# Patient Record
Sex: Male | Born: 1935
Health system: Southern US, Community
[De-identification: ages and names within clinical notes are randomized; demographics above are authoritative.]

## PROBLEM LIST (undated history)

## (undated) DIAGNOSIS — F419 Anxiety disorder, unspecified: Secondary | ICD-10-CM

## (undated) DIAGNOSIS — I509 Heart failure, unspecified: Secondary | ICD-10-CM

## (undated) DIAGNOSIS — E119 Type 2 diabetes mellitus without complications: Secondary | ICD-10-CM

## (undated) DIAGNOSIS — N189 Chronic kidney disease, unspecified: Secondary | ICD-10-CM

## (undated) DIAGNOSIS — J189 Pneumonia, unspecified organism: Secondary | ICD-10-CM

## (undated) DIAGNOSIS — E291 Testicular hypofunction: Secondary | ICD-10-CM

## (undated) DIAGNOSIS — R972 Elevated prostate specific antigen [PSA]: Secondary | ICD-10-CM

## (undated) DIAGNOSIS — E669 Obesity, unspecified: Secondary | ICD-10-CM

## (undated) DIAGNOSIS — M75100 Unspecified rotator cuff tear or rupture of unspecified shoulder, not specified as traumatic: Secondary | ICD-10-CM

## (undated) DIAGNOSIS — N529 Male erectile dysfunction, unspecified: Secondary | ICD-10-CM

## (undated) DIAGNOSIS — I10 Essential (primary) hypertension: Secondary | ICD-10-CM

## (undated) DIAGNOSIS — E785 Hyperlipidemia, unspecified: Secondary | ICD-10-CM

## (undated) DIAGNOSIS — J45909 Unspecified asthma, uncomplicated: Secondary | ICD-10-CM

## (undated) DIAGNOSIS — R011 Cardiac murmur, unspecified: Secondary | ICD-10-CM

## (undated) DIAGNOSIS — D649 Anemia, unspecified: Secondary | ICD-10-CM

## (undated) DIAGNOSIS — G629 Polyneuropathy, unspecified: Secondary | ICD-10-CM

## (undated) DIAGNOSIS — I2510 Atherosclerotic heart disease of native coronary artery without angina pectoris: Secondary | ICD-10-CM

## (undated) DIAGNOSIS — I739 Peripheral vascular disease, unspecified: Secondary | ICD-10-CM

## (undated) DIAGNOSIS — R0600 Dyspnea, unspecified: Secondary | ICD-10-CM

## (undated) HISTORY — DX: Essential (primary) hypertension: I10

## (undated) HISTORY — DX: Elevated prostate specific antigen (PSA): R97.20

## (undated) HISTORY — DX: Male erectile dysfunction, unspecified: N52.9

## (undated) HISTORY — DX: Testicular hypofunction: E29.1

## (undated) HISTORY — DX: Pneumonia, unspecified organism: J18.9

## (undated) HISTORY — DX: Hyperlipidemia, unspecified: E78.5

## (undated) HISTORY — DX: Obesity, unspecified: E66.9

## (undated) HISTORY — PX: COLONOSCOPY: SHX174

## (undated) HISTORY — DX: Unspecified rotator cuff tear or rupture of unspecified shoulder, not specified as traumatic: M75.100

---

## 2003-02-20 ENCOUNTER — Ambulatory Visit (HOSPITAL_COMMUNITY): Admission: RE | Admit: 2003-02-20 | Discharge: 2003-02-20 | Payer: Self-pay | Admitting: Family Medicine

## 2009-05-01 ENCOUNTER — Encounter (INDEPENDENT_AMBULATORY_CARE_PROVIDER_SITE_OTHER): Payer: Self-pay | Admitting: *Deleted

## 2010-04-29 NOTE — Letter (Signed)
Summary: Previsit letter  [facility]  [address] [place], [place] [address]   Phone: [phone]  Fax: [phone]       05/01/2009 MRN: [id number]  [facility] [address] [place], [place]  [address]  Dear Robert Horn,  Welcome to the Gastroenterology Division at [facility].    You are scheduled to see a nurse for your pre-procedure visit on 05/10/2009 at 1:00PM on the 3rd floor at [facility], [address]  We ask that you try to arrive at our office 15 minutes prior to your appointment time to allow for check-in.  Your nurse visit will consist of discussing your medical and surgical history, your immediate family medical history, and your medications.    Please bring a complete list of all your medications or, if you prefer, bring the medication bottles and we will list them.  We will need to be aware of both prescribed and over the counter drugs.  We will need to know exact dosage information as well.  If you are on blood thinners (Coumadin, Plavix, Aggrenox, Ticlid, etc.) please call our office today/prior to your appointment, as we need to consult with your physician about holding your medication.   Please be prepared to read and sign documents such as consent forms, a financial agreement, and acknowledgement forms.  If necessary, and with your consent, a friend or relative is welcome to sit-in on the nurse visit with you.  Please bring your insurance card so that we may make a copy of it.  If your insurance requires a referral to see a specialist, please bring your referral form from your primary care physician.  No co-pay is required for this nurse visit.     If you cannot keep your appointment, please call [phone] to cancel or reschedule prior to your appointment date.  This allows [place] the opportunity to schedule an appointment for another patient in need of care.    Thank you for choosing [facility] for your medical  needs.  We appreciate the opportunity to care for you.  Please visit [place] at our website  to learn more about our practice.                     Sincerely.                                                                                                                   The Gastroenterology Division

## 2011-02-05 ENCOUNTER — Ambulatory Visit
Admission: RE | Admit: 2011-02-05 | Discharge: 2011-02-05 | Disposition: A | Discharge status: Home / Self Care | Payer: Medicare Other | Source: Ambulatory Visit | Attending: Family Medicine | Admitting: Family Medicine

## 2011-02-05 ENCOUNTER — Other Ambulatory Visit: Payer: Self-pay | Admitting: Family Medicine

## 2011-02-05 DIAGNOSIS — E669 Obesity, unspecified: Secondary | ICD-10-CM

## 2011-02-05 DIAGNOSIS — I10 Essential (primary) hypertension: Secondary | ICD-10-CM

## 2013-03-11 ENCOUNTER — Encounter: Payer: Self-pay | Admitting: Cardiology

## 2013-03-13 ENCOUNTER — Ambulatory Visit: Payer: Medicare Other | Admitting: Interventional Cardiology

## 2013-03-24 ENCOUNTER — Encounter: Payer: Self-pay | Admitting: Interventional Cardiology

## 2013-05-01 ENCOUNTER — Telehealth: Payer: Self-pay

## 2013-05-01 NOTE — Telephone Encounter (Signed)
per Dr.Smith pt needs an o/v appt.appt made for 05/09/13 @10am  pt aware

## 2013-05-09 ENCOUNTER — Encounter: Payer: Self-pay | Admitting: Interventional Cardiology

## 2013-05-09 ENCOUNTER — Ambulatory Visit (INDEPENDENT_AMBULATORY_CARE_PROVIDER_SITE_OTHER): Payer: Medicare Other | Admitting: Interventional Cardiology

## 2013-05-09 VITALS — BP 142/90 | HR 94 | Ht 72.0 in | Wt 250.0 lb

## 2013-05-09 DIAGNOSIS — I5042 Chronic combined systolic (congestive) and diastolic (congestive) heart failure: Secondary | ICD-10-CM | POA: Insufficient documentation

## 2013-05-09 DIAGNOSIS — I4949 Other premature depolarization: Secondary | ICD-10-CM

## 2013-05-09 DIAGNOSIS — I5032 Chronic diastolic (congestive) heart failure: Secondary | ICD-10-CM

## 2013-05-09 DIAGNOSIS — E785 Hyperlipidemia, unspecified: Secondary | ICD-10-CM | POA: Insufficient documentation

## 2013-05-09 DIAGNOSIS — I359 Nonrheumatic aortic valve disorder, unspecified: Secondary | ICD-10-CM

## 2013-05-09 DIAGNOSIS — I350 Nonrheumatic aortic (valve) stenosis: Secondary | ICD-10-CM | POA: Insufficient documentation

## 2013-05-09 DIAGNOSIS — I493 Ventricular premature depolarization: Secondary | ICD-10-CM | POA: Insufficient documentation

## 2013-05-09 DIAGNOSIS — I10 Essential (primary) hypertension: Secondary | ICD-10-CM | POA: Insufficient documentation

## 2013-05-09 LAB — BASIC METABOLIC PANEL
BUN: 17 mg/dL (ref 6–23)
CO2: 28 mEq/L (ref 19–32)
Calcium: 9.4 mg/dL (ref 8.4–10.5)
Chloride: 104 mEq/L (ref 96–112)
Creatinine, Ser: 1 mg/dL (ref 0.4–1.5)
GFR: 88.9 mL/min (ref >60.00)
Glucose, Bld: 115 mg/dL — ABNORMAL HIGH (ref 70–99)
Potassium: 3.8 mEq/L (ref 3.5–5.1)
Sodium: 140 mEq/L (ref 135–145)

## 2013-05-09 MED ORDER — METOPROLOL SUCCINATE ER 25 MG PO TB24: 25.0000 mg | ORAL_TABLET | Freq: Every day | ORAL | Status: DC

## 2013-05-09 MED ORDER — AMLODIPINE BESYLATE 10 MG PO TABS: 10.0000 mg | ORAL_TABLET | Freq: Every day | ORAL | Status: DC

## 2013-05-09 MED ORDER — OLMESARTAN MEDOXOMIL-HCTZ 40-12.5 MG PO TABS: 1.0000 | ORAL_TABLET | Freq: Every day | ORAL | Status: DC

## 2013-05-09 NOTE — Patient Instructions (Signed)
No Changes were made today  Please call the office to let us know what dosage of Benicar HCT is listed on  your bottle. [phone]  Lab Today: [facility]  Your physician wants you to follow-up in: 6 months You will receive a reminder letter in the mail two months in advance. If you don't receive a letter, please call our office to schedule the follow-up appointment.

## 2013-05-09 NOTE — Progress Notes (Signed)
Patient ID: Robert Horn, male   DOB: 1933-05-07, 80 y.o.   MRN: [id number]    [address], [address] [place], [place]  [address] Phone: [phone] Fax:  [phone]  Date:  05/09/2013   ID:  Robert Horn, DOB [birth year], MRN [id number]  PCP:  No primary provider on file.   ASSESSMENT:  1. Hypertension, with borderline control 2. Aortic valve disease, with moderate aortic stenosis 3. Frequent PVCs and PACs  PLAN:  1. We will verify the patient's current medication regimen. Adjustments will be made to get better blood pressure control after we are certain about his current regimen.    SUBJECTIVE: Robert Horn is a 80 y.o. male who has no cardiac complaints. He has not had medication side effects. He denies dyspnea chest pain. There is no orthopnea lower extremity swelling. He has not had syncope or near-syncope   Wt Readings from Last 3 Encounters:  05/09/13 250 lb (113.399 kg)     Past Medical History  Diagnosis Date  . Pneumonia   . Obesity   . Hypertension   . Dyslipidemia   . Erectile dysfunction   . Hypogonadism male   . Elevated PSA   . Rotator cuff tear     Current Outpatient Prescriptions  Medication Sig Dispense Refill  . amLODipine (NORVASC) 10 MG tablet Take 10 mg by mouth daily.      Marland Kitchen aspirin (ASPIRIN EC) 81 MG EC tablet Take 81 mg by mouth daily. Swallow whole.      . metoprolol succinate (TOPROL-XL) 25 MG 24 hr tablet Take 25 mg by mouth daily.      . Multiple Vitamin (MULTIVITAMIN) capsule Take 1 capsule by mouth daily.      . naproxen sodium (ANAPROX) 220 MG tablet Take 220 mg by mouth as needed.       Marland Kitchen NIACIN CR PO Take 1 tablet by mouth as needed.      Marland Kitchen olmesartan-hydrochlorothiazide (BENICAR HCT) 40-12.5 MG per tablet Take 1 tablet by mouth daily.      . Omega 3 1000 MG CAPS Take 1 capsule by mouth daily.       No current facility-administered medications for this visit.    Allergies:   No Known Allergies  Social History:  The  patient  reports that he has quit smoking. He does not have any smokeless tobacco history on file. He reports that he does not drink alcohol or use illicit drugs.   ROS:  Please see the history of present illness.   Unable to lose weight. Benicar even though listed as 40/12.5 mg has not been updated at his pharmacy and he has only been taking Benicar HCT 20/12.5 mg   All other systems reviewed and negative.   OBJECTIVE: VS:  BP 142/90  Pulse 94  Ht 6' (1.829 m)  Wt 250 lb (113.399 kg)  BMI 33.90 kg/m2 Well nourished, well developed, in no acute distress, elderly but younger than stated age 27: normal Neck: JVD flat. Carotid bruit bilateral 1+ transmitted from the aortic valve  Cardiac:  normal S1, S2; RRR;  2-3 of 6 crescendo decrescendo systolic murmur, And 1/6 decrescendo of regurgitation  Lungs:  clear to auscultation bilaterally, no wheezing, rhonchi or rales Abd: soft, nontender, no hepatomegaly Ext: Edema  Absent . Pulses 2+  Skin: warm and dry Neuro:  CNs 2-12 intact, no focal abnormalities noted  EKG:  Sinus rhythm with first degree AV block and occasional PVC with right bundle branch  block.       Signed, Illene Labrador III, MD 05/09/2013 10:05 AM   Past Medical History  Pneumonia   Remote tobacco   Obesity   Hypertension   Dyslipidemia   Erectile dysfunction   Hypogonadism   elvated PSA   Rotator cuff tear

## 2013-05-10 NOTE — Progress Notes (Signed)
Quick Note:  Preliminary report reviewed by triage nurse and sent to MD desk. ______ 

## 2013-05-17 ENCOUNTER — Telehealth: Payer: Self-pay

## 2013-05-17 NOTE — Telephone Encounter (Signed)
Message copied by Lamar Laundry on Wed May 17, 2013  9:55 AM ------      Message from: Daneen Schick      Created: Thu May 11, 2013  5:50 PM       Normal labs ------

## 2013-05-17 NOTE — Telephone Encounter (Signed)
pt wife given lab results.Normal labs.pt wife verbalized understaning.

## 2014-06-16 ENCOUNTER — Other Ambulatory Visit: Payer: Self-pay | Admitting: Interventional Cardiology

## 2014-06-19 ENCOUNTER — Other Ambulatory Visit: Payer: Self-pay | Admitting: Interventional Cardiology

## 2014-09-20 ENCOUNTER — Other Ambulatory Visit: Payer: Self-pay

## 2014-09-20 ENCOUNTER — Other Ambulatory Visit: Payer: Self-pay | Admitting: Interventional Cardiology

## 2014-09-20 MED ORDER — AMLODIPINE BESYLATE 10 MG PO TABS: ORAL_TABLET | ORAL | Status: DC

## 2014-09-20 MED ORDER — OLMESARTAN MEDOXOMIL-HCTZ 40-12.5 MG PO TABS: 1.0000 | ORAL_TABLET | Freq: Every day | ORAL | Status: DC

## 2014-09-20 MED ORDER — METOPROLOL SUCCINATE ER 25 MG PO TB24: ORAL_TABLET | ORAL | Status: DC

## 2014-10-22 ENCOUNTER — Other Ambulatory Visit: Payer: Self-pay | Admitting: Interventional Cardiology

## 2014-11-18 ENCOUNTER — Other Ambulatory Visit: Payer: Self-pay | Admitting: Interventional Cardiology

## 2014-11-29 ENCOUNTER — Other Ambulatory Visit: Payer: Medicare Other | Admitting: *Deleted

## 2015-03-20 ENCOUNTER — Other Ambulatory Visit: Payer: Self-pay | Admitting: Interventional Cardiology

## 2015-04-16 ENCOUNTER — Other Ambulatory Visit: Payer: Self-pay | Admitting: Interventional Cardiology

## 2015-05-20 ENCOUNTER — Other Ambulatory Visit: Payer: Self-pay | Admitting: Interventional Cardiology

## 2015-05-20 NOTE — Telephone Encounter (Signed)
REFILL 

## 2015-05-24 ENCOUNTER — Other Ambulatory Visit: Payer: Self-pay | Admitting: Interventional Cardiology

## 2015-06-14 ENCOUNTER — Other Ambulatory Visit: Payer: Self-pay | Admitting: Interventional Cardiology

## 2015-06-14 MED ORDER — METOPROLOL SUCCINATE ER 25 MG PO TB24: 25.0000 mg | ORAL_TABLET | Freq: Every day | ORAL | Status: DC

## 2015-06-14 MED ORDER — AMLODIPINE BESYLATE 10 MG PO TABS: 10.0000 mg | ORAL_TABLET | Freq: Every day | ORAL | Status: DC

## 2015-06-14 MED ORDER — OLMESARTAN MEDOXOMIL-HCTZ 40-12.5 MG PO TABS: 1.0000 | ORAL_TABLET | Freq: Every day | ORAL | Status: DC

## 2015-07-04 ENCOUNTER — Other Ambulatory Visit: Payer: Self-pay | Admitting: Interventional Cardiology

## 2015-07-04 NOTE — Telephone Encounter (Signed)
Medication Detail      Disp Refills Start End     metoprolol succinate (TOPROL-XL) 25 MG 24 hr tablet 90 tablet 0 06/14/2015     Sig - Route: Take 1 tablet (25 mg total) by mouth daily. - Oral    Notes to Pharmacy: Please keep 07/26/15 appointment for further refills    E-Prescribing Status: Receipt confirmed by pharmacy (06/14/2015 2:45 PM EDT)     Pharmacy    [facility] [place], [place]

## 2015-07-09 ENCOUNTER — Other Ambulatory Visit (HOSPITAL_COMMUNITY): Payer: Self-pay | Admitting: Pulmonary Disease

## 2015-07-09 ENCOUNTER — Ambulatory Visit (HOSPITAL_COMMUNITY)
Admission: RE | Admit: 2015-07-09 | Discharge: 2015-07-09 | Disposition: A | Discharge status: Home / Self Care | Payer: Medicare Other | Source: Ambulatory Visit | Attending: Pulmonary Disease | Admitting: Pulmonary Disease

## 2015-07-09 DIAGNOSIS — R0602 Shortness of breath: Secondary | ICD-10-CM | POA: Diagnosis not present

## 2015-07-25 ENCOUNTER — Telehealth: Payer: Self-pay | Admitting: Interventional Cardiology

## 2015-07-25 NOTE — Telephone Encounter (Signed)
Pt returned call

## 2015-07-25 NOTE — Telephone Encounter (Signed)
Called pt lmtcb

## 2015-07-25 NOTE — Telephone Encounter (Signed)
Robert Horn wants a call from the nurse-was asking to move appt from 3 tomorrow to 4, told him that was not available right now, he wanted to rs to Tues or Thurs at 4, closet I had was 345 on 8-8, he doesn't want to wait-pls call

## 2015-07-26 ENCOUNTER — Ambulatory Visit (INDEPENDENT_AMBULATORY_CARE_PROVIDER_SITE_OTHER): Payer: Medicare Other | Admitting: Interventional Cardiology

## 2015-07-26 DIAGNOSIS — E785 Hyperlipidemia, unspecified: Secondary | ICD-10-CM

## 2015-07-26 DIAGNOSIS — I350 Nonrheumatic aortic (valve) stenosis: Secondary | ICD-10-CM

## 2015-07-26 DIAGNOSIS — I110 Hypertensive heart disease with heart failure: Secondary | ICD-10-CM | POA: Insufficient documentation

## 2015-07-26 DIAGNOSIS — I493 Ventricular premature depolarization: Secondary | ICD-10-CM

## 2015-07-26 DIAGNOSIS — I119 Hypertensive heart disease without heart failure: Secondary | ICD-10-CM

## 2015-07-26 DIAGNOSIS — I10 Essential (primary) hypertension: Secondary | ICD-10-CM

## 2015-07-26 DIAGNOSIS — I5032 Chronic diastolic (congestive) heart failure: Secondary | ICD-10-CM

## 2015-07-28 NOTE — Progress Notes (Signed)
No show

## 2015-07-31 ENCOUNTER — Encounter: Payer: Self-pay | Admitting: Interventional Cardiology

## 2015-08-20 ENCOUNTER — Other Ambulatory Visit: Payer: Self-pay | Admitting: Interventional Cardiology

## 2015-08-21 NOTE — Telephone Encounter (Signed)
yes

## 2015-08-21 NOTE — Telephone Encounter (Signed)
Ok to extend refill for another 3 months?

## 2015-10-08 ENCOUNTER — Other Ambulatory Visit: Payer: Self-pay | Admitting: Interventional Cardiology

## 2015-10-26 ENCOUNTER — Encounter (HOSPITAL_COMMUNITY): Payer: Self-pay

## 2015-10-26 ENCOUNTER — Emergency Department (HOSPITAL_COMMUNITY): Payer: Medicare Other

## 2015-10-26 ENCOUNTER — Other Ambulatory Visit (HOSPITAL_COMMUNITY): Payer: Medicare Other

## 2015-10-26 ENCOUNTER — Inpatient Hospital Stay (HOSPITAL_COMMUNITY)
Admission: EM | Admit: 2015-10-26 | Discharge: 2015-10-27 | DRG: 291 | Disposition: A | Discharge status: Home / Self Care | Payer: Medicare Other | Attending: Internal Medicine | Admitting: Internal Medicine

## 2015-10-26 DIAGNOSIS — R0602 Shortness of breath: Secondary | ICD-10-CM | POA: Diagnosis not present

## 2015-10-26 DIAGNOSIS — I4510 Unspecified right bundle-branch block: Secondary | ICD-10-CM | POA: Diagnosis present

## 2015-10-26 DIAGNOSIS — I509 Heart failure, unspecified: Secondary | ICD-10-CM

## 2015-10-26 DIAGNOSIS — I110 Hypertensive heart disease with heart failure: Secondary | ICD-10-CM | POA: Diagnosis not present

## 2015-10-26 DIAGNOSIS — I5031 Acute diastolic (congestive) heart failure: Secondary | ICD-10-CM

## 2015-10-26 DIAGNOSIS — E291 Testicular hypofunction: Secondary | ICD-10-CM | POA: Diagnosis not present

## 2015-10-26 DIAGNOSIS — Z7982 Long term (current) use of aspirin: Secondary | ICD-10-CM | POA: Diagnosis not present

## 2015-10-26 DIAGNOSIS — Z9114 Patient's other noncompliance with medication regimen: Secondary | ICD-10-CM | POA: Diagnosis not present

## 2015-10-26 DIAGNOSIS — N529 Male erectile dysfunction, unspecified: Secondary | ICD-10-CM | POA: Diagnosis present

## 2015-10-26 DIAGNOSIS — I440 Atrioventricular block, first degree: Secondary | ICD-10-CM | POA: Diagnosis present

## 2015-10-26 DIAGNOSIS — I350 Nonrheumatic aortic (valve) stenosis: Secondary | ICD-10-CM | POA: Diagnosis not present

## 2015-10-26 DIAGNOSIS — R0600 Dyspnea, unspecified: Secondary | ICD-10-CM | POA: Diagnosis present

## 2015-10-26 DIAGNOSIS — J449 Chronic obstructive pulmonary disease, unspecified: Secondary | ICD-10-CM | POA: Diagnosis present

## 2015-10-26 DIAGNOSIS — I5043 Acute on chronic combined systolic (congestive) and diastolic (congestive) heart failure: Secondary | ICD-10-CM | POA: Diagnosis not present

## 2015-10-26 DIAGNOSIS — I169 Hypertensive crisis, unspecified: Secondary | ICD-10-CM | POA: Diagnosis not present

## 2015-10-26 DIAGNOSIS — Z6835 Body mass index (BMI) 35.0-35.9, adult: Secondary | ICD-10-CM | POA: Diagnosis not present

## 2015-10-26 DIAGNOSIS — R7989 Other specified abnormal findings of blood chemistry: Secondary | ICD-10-CM | POA: Diagnosis not present

## 2015-10-26 DIAGNOSIS — E785 Hyperlipidemia, unspecified: Secondary | ICD-10-CM | POA: Diagnosis present

## 2015-10-26 DIAGNOSIS — J9601 Acute respiratory failure with hypoxia: Secondary | ICD-10-CM

## 2015-10-26 DIAGNOSIS — Z79899 Other long term (current) drug therapy: Secondary | ICD-10-CM | POA: Diagnosis not present

## 2015-10-26 DIAGNOSIS — I10 Essential (primary) hypertension: Secondary | ICD-10-CM | POA: Diagnosis present

## 2015-10-26 DIAGNOSIS — I5033 Acute on chronic diastolic (congestive) heart failure: Secondary | ICD-10-CM | POA: Diagnosis not present

## 2015-10-26 DIAGNOSIS — R799 Abnormal finding of blood chemistry, unspecified: Secondary | ICD-10-CM | POA: Diagnosis not present

## 2015-10-26 DIAGNOSIS — Z87891 Personal history of nicotine dependence: Secondary | ICD-10-CM | POA: Diagnosis not present

## 2015-10-26 DIAGNOSIS — R739 Hyperglycemia, unspecified: Secondary | ICD-10-CM

## 2015-10-26 DIAGNOSIS — E669 Obesity, unspecified: Secondary | ICD-10-CM | POA: Diagnosis present

## 2015-10-26 DIAGNOSIS — J9600 Acute respiratory failure, unspecified whether with hypoxia or hypercapnia: Secondary | ICD-10-CM | POA: Diagnosis not present

## 2015-10-26 LAB — I-STAT CHEM 8, ED
BUN: 19 mg/dL (ref 6–20)
CREATININE: 1.1 mg/dL (ref 0.61–1.24)
Calcium, Ion: 1.18 mmol/L (ref 1.12–1.23)
Chloride: 108 mmol/L (ref 101–111)
GLUCOSE: 140 mg/dL — AB (ref 65–99)
HEMATOCRIT: 39 % (ref 39.0–52.0)
HEMOGLOBIN: 13.3 g/dL (ref 13.0–17.0)
Potassium: 3.8 mmol/L (ref 3.5–5.1)
Sodium: 143 mmol/L (ref 135–145)
TCO2: 25 mmol/L (ref 0–100)

## 2015-10-26 LAB — CBC WITH DIFFERENTIAL/PLATELET
BASOS PCT: 1 %
Basophils Absolute: 0 10*3/uL (ref 0.0–0.1)
EOS ABS: 0.2 10*3/uL (ref 0.0–0.7)
EOS PCT: 4 %
HCT: 38.5 % — ABNORMAL LOW (ref 39.0–52.0)
Hemoglobin: 12.3 g/dL — ABNORMAL LOW (ref 13.0–17.0)
Lymphocytes Relative: 26 %
Lymphs Abs: 1.5 10*3/uL (ref 0.7–4.0)
MCH: 28.7 pg (ref 26.0–34.0)
MCHC: 31.9 g/dL (ref 30.0–36.0)
MCV: 90 fL (ref 78.0–100.0)
MONOS PCT: 12 %
Monocytes Absolute: 0.7 10*3/uL (ref 0.1–1.0)
NEUTROS PCT: 57 %
Neutro Abs: 3.3 10*3/uL (ref 1.7–7.7)
PLATELETS: ADEQUATE 10*3/uL (ref 150–400)
RBC: 4.28 MIL/uL (ref 4.22–5.81)
RDW: 14.9 % (ref 11.5–15.5)
WBC: 5.7 10*3/uL (ref 4.0–10.5)

## 2015-10-26 LAB — COMPREHENSIVE METABOLIC PANEL
ALBUMIN: 4 g/dL (ref 3.5–5.0)
ALK PHOS: 61 U/L (ref 38–126)
ALT: 22 U/L (ref 17–63)
ANION GAP: 7 (ref 5–15)
AST: 33 U/L (ref 15–41)
BILIRUBIN TOTAL: 0.7 mg/dL (ref 0.3–1.2)
BUN: 20 mg/dL (ref 6–20)
CALCIUM: 8.9 mg/dL (ref 8.9–10.3)
CO2: 25 mmol/L (ref 22–32)
CREATININE: 1.11 mg/dL (ref 0.61–1.24)
Chloride: 109 mmol/L (ref 101–111)
GFR calc Af Amer: >60 mL/min (ref >60)
GFR calc non Af Amer: >60 mL/min (ref >60)
GLUCOSE: 140 mg/dL — AB (ref 65–99)
Potassium: 3.7 mmol/L (ref 3.5–5.1)
SODIUM: 141 mmol/L (ref 135–145)
TOTAL PROTEIN: 7.7 g/dL (ref 6.5–8.1)

## 2015-10-26 LAB — I-STAT CG4 LACTIC ACID, ED: LACTIC ACID, VENOUS: 1.28 mmol/L (ref 0.5–1.9)

## 2015-10-26 LAB — GLUCOSE, CAPILLARY: GLUCOSE-CAPILLARY: 125 mg/dL — AB (ref 65–99)

## 2015-10-26 LAB — BRAIN NATRIURETIC PEPTIDE: B Natriuretic Peptide: 839.1 pg/mL — ABNORMAL HIGH (ref 0.0–100.0)

## 2015-10-26 LAB — I-STAT TROPONIN, ED: TROPONIN I, POC: 0.04 ng/mL (ref 0.00–0.08)

## 2015-10-26 MED ORDER — HYDRALAZINE HCL 25 MG PO TABS
25.0000 mg | ORAL_TABLET | Freq: Three times a day (TID) | ORAL | Status: DC
  Administered 2015-10-26 – 2015-10-27 (×3): 25 mg via ORAL
  Filled 2015-10-26 (×2): qty 1

## 2015-10-26 MED ORDER — IPRATROPIUM-ALBUTEROL 0.5-2.5 (3) MG/3ML IN SOLN: 3.0000 mL | Freq: Once | RESPIRATORY_TRACT | Status: DC

## 2015-10-26 MED ORDER — AMLODIPINE BESYLATE 10 MG PO TABS
10.0000 mg | ORAL_TABLET | Freq: Once | ORAL | Status: AC
  Administered 2015-10-26: 10 mg via ORAL
  Filled 2015-10-26: qty 1

## 2015-10-26 MED ORDER — FUROSEMIDE 10 MG/ML IJ SOLN: 20.0000 mg | Freq: Once | INTRAMUSCULAR | Status: DC

## 2015-10-26 MED ORDER — NITROGLYCERIN 2 % TD OINT
1.0000 [in_us] | TOPICAL_OINTMENT | Freq: Once | TRANSDERMAL | Status: AC
  Administered 2015-10-26: 1 [in_us] via TOPICAL
  Filled 2015-10-26: qty 1

## 2015-10-26 MED ORDER — SPIRONOLACTONE 12.5 MG HALF TABLET
12.5000 mg | ORAL_TABLET | Freq: Every day | ORAL | Status: DC
  Administered 2015-10-26 – 2015-10-27 (×2): 12.5 mg via ORAL
  Filled 2015-10-26 (×2): qty 1

## 2015-10-26 MED ORDER — POTASSIUM CHLORIDE CRYS ER 20 MEQ PO TBCR
20.0000 meq | EXTENDED_RELEASE_TABLET | Freq: Two times a day (BID) | ORAL | Status: DC
  Administered 2015-10-26 – 2015-10-27 (×2): 20 meq via ORAL
  Filled 2015-10-26: qty 1
  Filled 2015-10-26: qty 2
  Filled 2015-10-26: qty 1

## 2015-10-26 MED ORDER — IRBESARTAN 300 MG PO TABS
300.0000 mg | ORAL_TABLET | Freq: Every day | ORAL | Status: DC
  Administered 2015-10-26 – 2015-10-27 (×2): 300 mg via ORAL
  Filled 2015-10-26 (×2): qty 1

## 2015-10-26 MED ORDER — ENOXAPARIN SODIUM 40 MG/0.4ML ~~LOC~~ SOLN
40.0000 mg | SUBCUTANEOUS | Status: DC
  Administered 2015-10-26: 40 mg via SUBCUTANEOUS
  Filled 2015-10-26: qty 0.4

## 2015-10-26 MED ORDER — FUROSEMIDE 10 MG/ML IJ SOLN
80.0000 mg | Freq: Two times a day (BID) | INTRAMUSCULAR | Status: DC
  Administered 2015-10-26 – 2015-10-27 (×2): 80 mg via INTRAVENOUS
  Filled 2015-10-26 (×2): qty 8

## 2015-10-26 MED ORDER — ONDANSETRON HCL 4 MG/2ML IJ SOLN: 4.0000 mg | Freq: Four times a day (QID) | INTRAMUSCULAR | Status: DC | PRN

## 2015-10-26 MED ORDER — FUROSEMIDE 10 MG/ML IJ SOLN: 80.0000 mg | Freq: Two times a day (BID) | INTRAMUSCULAR | Status: DC

## 2015-10-26 MED ORDER — SODIUM CHLORIDE 0.9% FLUSH
3.0000 mL | Freq: Two times a day (BID) | INTRAVENOUS | Status: DC
  Administered 2015-10-26 – 2015-10-27 (×2): 3 mL via INTRAVENOUS

## 2015-10-26 MED ORDER — ACETAMINOPHEN 325 MG PO TABS: 650.0000 mg | ORAL_TABLET | ORAL | Status: DC | PRN

## 2015-10-26 MED ORDER — ADULT MULTIVITAMIN W/MINERALS CH
1.0000 | ORAL_TABLET | Freq: Every day | ORAL | Status: DC
  Administered 2015-10-27: 1 via ORAL
  Filled 2015-10-26 (×2): qty 1

## 2015-10-26 MED ORDER — SODIUM CHLORIDE 0.9 % IV SOLN
250.0000 mL | INTRAVENOUS | Status: DC | PRN
Start: 2015-10-26 — End: 2015-10-27

## 2015-10-26 MED ORDER — INSULIN ASPART 100 UNIT/ML ~~LOC~~ SOLN
0.0000 [IU] | Freq: Three times a day (TID) | SUBCUTANEOUS | Status: DC
Start: 2015-10-27 — End: 2015-10-27
  Administered 2015-10-27: 1 [IU] via SUBCUTANEOUS

## 2015-10-26 MED ORDER — OMEGA-3-ACID ETHYL ESTERS 1 G PO CAPS
1.0000 | ORAL_CAPSULE | Freq: Every day | ORAL | Status: DC
Start: 2015-10-27 — End: 2015-10-27
  Administered 2015-10-27: 1 g via ORAL
  Filled 2015-10-26: qty 1

## 2015-10-26 MED ORDER — NITROGLYCERIN 0.4 MG SL SUBL
0.4000 mg | SUBLINGUAL_TABLET | SUBLINGUAL | Status: DC | PRN
Start: 2015-10-26 — End: 2015-10-26
  Administered 2015-10-26: 0.4 mg via SUBLINGUAL
  Filled 2015-10-26: qty 1

## 2015-10-26 MED ORDER — HYDRALAZINE HCL 20 MG/ML IJ SOLN
20.0000 mg | Freq: Once | INTRAMUSCULAR | Status: DC
  Filled 2015-10-26: qty 1

## 2015-10-26 MED ORDER — SODIUM CHLORIDE 0.9% FLUSH: 3.0000 mL | INTRAVENOUS | Status: DC | PRN

## 2015-10-26 MED ORDER — ASPIRIN 81 MG PO CHEW: 324.0000 mg | CHEWABLE_TABLET | Freq: Once | ORAL | Status: DC

## 2015-10-26 MED ORDER — LEVALBUTEROL HCL 0.63 MG/3ML IN NEBU
0.6300 mg | INHALATION_SOLUTION | Freq: Once | RESPIRATORY_TRACT | Status: AC
  Administered 2015-10-26: 0.63 mg via RESPIRATORY_TRACT
  Filled 2015-10-26: qty 3

## 2015-10-26 MED ORDER — ASPIRIN EC 81 MG PO TBEC
81.0000 mg | DELAYED_RELEASE_TABLET | Freq: Every day | ORAL | Status: DC
  Administered 2015-10-27: 81 mg via ORAL
  Filled 2015-10-26: qty 1

## 2015-10-26 NOTE — ED Provider Notes (Signed)
[facility] DEPT Provider Note   CSN: [id number] Arrival date & time: 10/26/15  [id number]  First Provider Contact:  None       History   Chief Complaint Chief Complaint  Patient presents with  . Shortness of Breath    HPI Robert Horn is a 82 y.o. male.  HPI 82 year old male with past medical history of hypertension, hyperlipidemia, mild asthma, and aortic stenosis who presents with acute onset of shortness of breath. The patient states he was in his usual state of health yesterday. He was able to golf without difficulty. Approximately 3 AM, he developed mild wheezing and shortness of breath. He was able to sleep the remainder of the night but woke up with significant portion shortness of breath. He describes a constant sensation of being unable to catch his breath. He has also noticed wheezing. He has had no associated chest pain or lightheadedness. No syncope. Of note, patient has been out of his antihypertensive medications for the last several weeks. No fevers or chills. No cough or sputum production.  Past Medical History:  Diagnosis Date  . Dyslipidemia   . Elevated PSA   . Erectile dysfunction   . Hypertension   . Hypogonadism male   . Obesity   . Pneumonia   . Rotator cuff tear     Patient Active Problem List   Diagnosis Date Noted  . Hypertensive cardiovascular disease 07/26/2015  . Essential hypertension 05/09/2013  . Aortic stenosis 05/09/2013  . Hyperlipidemia 05/09/2013  . PVC's (premature ventricular contractions) 05/09/2013  . Chronic diastolic heart failure ([place]) 05/09/2013    No past surgical history on file.     Home Medications    Prior to Admission medications   Medication Sig Start Date End Date Taking? Authorizing Provider  amLODipine (NORVASC) 10 MG tablet TAKE 1 TABLET BY MOUTH  DAILY. PLEASE KEEP  APPOINTMENT FOR FURTHER  REFILLS TO BE GRANTED 10/08/15   [name], MD  aspirin (ASPIRIN EC) 81 MG EC tablet Take 81 mg by mouth daily.  Swallow whole.    Historical Provider, MD  metoprolol succinate (TOPROL-XL) 25 MG 24 hr tablet TAKE 1 TABLET BY MOUTH  DAILY. PLEASE KEEP  APPOINTMENT FOR FURTHER  REFILLS TO BE GRANTED 10/08/15   [name], MD  Multiple Vitamin (MULTIVITAMIN) capsule Take 1 capsule by mouth daily.    Historical Provider, MD  naproxen sodium (ANAPROX) 220 MG tablet Take 220 mg by mouth as needed.     Historical Provider, MD  NIACIN CR PO Take 1 tablet by mouth as needed.    Historical Provider, MD  olmesartan-hydrochlorothiazide (BENICAR HCT) 40-12.5 MG tablet TAKE 1 TABLET BY MOUTH  DAILY. PLEASE KEEP  APPOINTMENT FOR FURTHER  REFILLS TO BE GRANTED 10/08/15   [name], MD  Omega 3 1000 MG CAPS Take 1 capsule by mouth daily.    Historical Provider, MD    Family History No family history on file.  Social History Social History  Substance Use Topics  . Smoking status: Former Research scientist (life sciences)  . Smokeless tobacco: Not on file  . Alcohol use No     Allergies   Review of patient's allergies indicates no known allergies.   Review of Systems Review of Systems  Constitutional: Positive for fatigue. Negative for chills and fever.  HENT: Negative for congestion and rhinorrhea.   Eyes: Negative for visual disturbance.  Respiratory: Positive for shortness of breath and wheezing. Negative for cough.   Cardiovascular: Negative for chest pain  and leg swelling.  Gastrointestinal: Negative for abdominal pain, diarrhea, nausea and vomiting.  Genitourinary: Negative for dysuria and flank pain.  Musculoskeletal: Negative for neck stiffness.  Skin: Negative for rash.  Allergic/Immunologic: Negative for immunocompromised state.  Neurological: Negative for syncope, weakness and headaches.     Physical Exam Updated Vital Signs BP (!) 174/157 (BP Location: Left Arm)   Pulse 109   Temp 97.9 F (36.6 C) (Oral)   Resp (!) 36   SpO2 95%   Physical Exam  Constitutional: He appears well-developed and well-nourished.  He appears distressed.  HENT:  Head: Normocephalic.  Mouth/Throat: Oropharynx is clear and moist. No oropharyngeal exudate.  Eyes: Conjunctivae are normal. Pupils are equal, round, and reactive to light.  Neck: Normal range of motion. Neck supple. No JVD present.  Cardiovascular: Regular rhythm and intact distal pulses.  Tachycardia present.  Exam reveals no friction rub.   Murmur (Harsh, crescendo-decrescendo, worse at left lower sternal border) heard. Pulmonary/Chest: Accessory muscle usage present. Tachypnea noted. He has decreased breath sounds. He has wheezes. He has rales in the right lower field and the left lower field.  Abdominal: Soft. He exhibits no distension. There is no tenderness.  Musculoskeletal: He exhibits no edema.  Neurological: He is alert. He exhibits normal muscle tone.  Skin: Skin is warm. Capillary refill takes less than 2 seconds. No rash noted.  Nursing note and vitals reviewed.    ED Treatments / Results  Labs (all labs ordered are listed, but only abnormal results are displayed) Labs Reviewed  COMPREHENSIVE METABOLIC PANEL - Abnormal; Notable for the following:       Result Value   Glucose, Bld 140 (*)    All other components within normal limits  BRAIN NATRIURETIC PEPTIDE - Abnormal; Notable for the following:    B Natriuretic Peptide 839.1 (*)    All other components within normal limits  CBC WITH DIFFERENTIAL/PLATELET - Abnormal; Notable for the following:    Hemoglobin 12.3 (*)    HCT 38.5 (*)    All other components within normal limits  I-STAT CHEM 8, ED - Abnormal; Notable for the following:    Glucose, Bld 140 (*)    All other components within normal limits  I-STAT CG4 LACTIC ACID, ED  I-STAT TROPOININ, ED    EKG  EKG Interpretation  Date/Time:  Saturday October 26 2015 09:05:26 EDT Ventricular Rate:  97 PR Interval:    QRS Duration: 111 QT Interval:  374 QTC Calculation: 476 R Axis:   113 Text Interpretation:  Sinus or ectopic  atrial rhythm Prolonged PR interval Anterior infarct, old No old tracing to compare ST depression V6 Incomplete right bundle branch block Confirmed by Rome Schlauch MD, Lysbeth Galas [phone]) on 10/26/2015 9:23:08 AM       Radiology No results found.  Procedures Procedures (including critical care time)  Medications Ordered in ED Medications  ipratropium-albuterol (DUONEB) 0.5-2.5 (3) MG/3ML nebulizer solution 3 mL (not administered)  nitroGLYCERIN (NITROSTAT) SL tablet 0.4 mg (not administered)  aspirin chewable tablet 324 mg (not administered)     Initial Impression / Assessment and Plan / ED Course  I have reviewed the triage vital signs and the nursing notes.  Pertinent labs & imaging results that were available during my care of the patient were reviewed by me and considered in my medical decision making (see chart for details).  Clinical Course  82 year old male with past medical history of hypertension, diastolic CHF, and aortic stenosis who presents with acute onset of  shortness of breath. Primary concern is possible aortic stenosis intervening to CHF in the setting of poorly controlled hypertension. However, patient does not appear overtly hypervolemic on exam. Differential also includes pneumonia with reactive airway component as the patient has had asthma in the past and responded well to albuterol previously. Other considerations include PE although less likely in the setting of recent negative duplex ultrasound and no significant hypoxia. Will treat with aspirin, nitroglycerin for afterload reduction, and a trial of Aleve. Albuterol. Will monitor heart rate closely as I do not want to drop his preload in the setting of aortic stenosis.  Labs and imaging reviewed as above. Chest x-ray shows cardiomegaly and BNP is elevated. Otherwise, troponin is negative and labs are otherwise reassuring. Patient had marked symptomatic improvement with afterload reduction with nitroglycerin. Discussed case  with cardiology. Will give his ARB and amlodipine for afterload reduction and admit to medicine for management of acute symptomatic aortic stenosis with CHF. Will hold on Lasix at this time pending cards recommendations as I do not want to drop his preload. Will also hold on beta blocker to prevent any reduction and cardiac contractility. Patient appears to be symptomatically improving. Will admit to Medicine.  Final Clinical Impressions(s) / ED Diagnoses   Final diagnoses:  Aortic stenosis  Acute on chronic congestive heart failure, unspecified congestive heart failure type (HCC)  Elevated brain natriuretic peptide (BNP) level      Duffy Bruce, MD 10/26/15 1700

## 2015-10-26 NOTE — Consult Note (Signed)
Referring Physician: Dr. Ellender Hose, MD (EDP) Primary Cardiologist: Pernell Dupre, MD Reason for Consultation:    HPI:  82 y/o male with morbid obesity, HTN, moderate AS (last echo 2015), 1st AVB frequent PVCs and PACs   He says he was in his usual state of health yesterday. He was able to golf without difficulty. Approximately 3 AM, he developed mild wheezing and shortness of breath. He was able to sleep the remainder of the night but woke upagain this am with recurrent severe dyspnea and wheezing. Denied CP or tightness. Admits to being out of his antihypertensive medications for the last two weeks. No fevers or chills. No cough or sputum production.  In ER, BP 165/99. CXR was clear but BNP elevated at 839. Trop 0.04.  Given amlodipine 10, irbesartan 300, NTG and nebulizer. Feeling better but still mildly dyspneic.     Review of Systems:     Cardiac Review of Systems: {Y] = yes [ ]  = no  Chest Pain [    ]  Resting SOB Robert Horn   ] Exertional SOB  Robert Horn  ]  [place].Latina [ y ]   Pedal Edema [   ]    Palpitations [  ] Syncope  [  ]   Presyncope [   ]  General Review of Systems: [Y] = yes [  ]=no Constitional: recent weight change [  ]; anorexia [  ]; fatigue [  ]; nausea [  ]; night sweats [  ]; fever [  ]; or chills [  ];         Eyes : blurred vision [  ]; diplopia [   ]; vision changes [  ];  Amaurosis fugax[  ]; Resp: cough [  ];  wheezing[ y ];  hemoptysis[  ];  PND [ y ];  GI:  gallstones[  ], vomiting[  ];  dysphagia[  ]; melena[  ];  hematochezia [  ]; heartburn[  ];   GU: kidney stones [  ]; hematuria[  ];   dysuria [  ];  nocturia[  ]; incontinence [  ];             Skin: rash, swelling[  ];, hair loss[  ];  peripheral edema[  ];  or itching[  ]; Musculosketetal: myalgias[  ];  joint swelling[  ];  joint erythema[  ];  joint pain[ y ];  back pain[  ];  Heme/Lymph: bruising[  ];  bleeding[  ];  anemia[  ];  Neuro: TIA[  ];  headaches[  ];  stroke[  ];  vertigo[  ];  seizures[  ];    paresthesias[  ];  difficulty walking[  ];  Psych:depression[  ]; anxiety[  ];  Endocrine: diabetes[  ];  thyroid dysfunction[  ];  Other:  Past Medical History:  Diagnosis Date  . Dyslipidemia   . Elevated PSA   . Erectile dysfunction   . Hypertension   . Hypogonadism male   . Obesity   . Pneumonia   . Rotator cuff tear      (Not in a hospital admission)   . irbesartan  300 mg Oral Daily    Infusions:    No Known Allergies  Social History   Social History  . Marital status: Single    Spouse name: N/A  . Number of children: N/A  . Years of education: N/A   Occupational History  . Not on file.   Social History Main Topics  .  Smoking status: Former Research scientist (life sciences)  . Smokeless tobacco: Not on file  . Alcohol use No  . Drug use: No  . Sexual activity: Not on file   Other Topics Concern  . Not on file   Social History Narrative  . No narrative on file    Fhx:   Mother with DM2, heart disease and PAD Father with PE   PHYSICAL EXAM: Vitals:   10/26/15 1530 10/26/15 1600  BP: 165/99 (!) 161/101  Pulse:    Resp: 17 22  Temp:      No intake or output data in the 24 hours ending 10/26/15 1640  General:  Elderly Mildly dyspneic HEENT: normal Neck: supple. JVP to jaw Carotids 2+ bilat; + bilateral bruits. No lymphadenopathy or thryomegaly appreciated. Cor: PMI nondisplaced. Regular rate & rhythm. 2/6 AS mildly reduced S2 but audiblev Lungs: prolonged expiratory phase but no active wheezing Abdomen: obese soft, nontender, + distended. No hepatosplenomegaly. No bruits or masses. Good bowel sounds. Extremities: no cyanosis, clubbing, rash, tr-1+ edema Neuro: alert & oriented x 3, cranial nerves grossly intact. moves all 4 extremities w/o difficulty. Affect pleasant.  ECG: sinus 97 1 degree AVB 242ms. IVCD. Mild STY depression in V6  Results for orders placed or performed during the hospital encounter of 10/26/15 (from the past 24 hour(s))  Comprehensive  metabolic panel     Status: Abnormal   Collection Time: 10/26/15  9:29 AM  Result Value Ref Range   Sodium 141 135 - 145 mmol/L   Potassium 3.7 3.5 - 5.1 mmol/L   Chloride 109 101 - 111 mmol/L   CO2 25 22 - 32 mmol/L   Glucose, Bld 140 (H) 65 - 99 mg/dL   BUN 20 6 - 20 mg/dL   Creatinine, Ser 1.11 0.61 - 1.24 mg/dL   Calcium 8.9 8.9 - 10.3 mg/dL   Total Protein 7.7 6.5 - 8.1 g/dL   Albumin 4.0 3.5 - 5.0 g/dL   AST 33 15 - 41 U/L   ALT 22 17 - 63 U/L   Alkaline Phosphatase 61 38 - 126 U/L   Total Bilirubin 0.7 0.3 - 1.2 mg/dL   GFR calc non Af Amer >60 >60 mL/min   GFR calc Af Amer >60 >60 mL/min   Anion gap 7 5 - 15  CBC with Differential     Status: Abnormal   Collection Time: 10/26/15  9:29 AM  Result Value Ref Range   WBC 5.7 4.0 - 10.5 K/uL   RBC 4.28 4.22 - 5.81 MIL/uL   Hemoglobin 12.3 (L) 13.0 - 17.0 g/dL   HCT 38.5 (L) 39.0 - 52.0 %   MCV 90.0 78.0 - 100.0 fL   MCH 28.7 26.0 - 34.0 pg   MCHC 31.9 30.0 - 36.0 g/dL   RDW 14.9 11.5 - 15.5 %   Platelets  150 - 400 K/uL    PLATELET CLUMPS NOTED ON SMEAR, COUNT APPEARS ADEQUATE   Neutrophils Relative % 57 %   Neutro Abs 3.3 1.7 - 7.7 K/uL   Lymphocytes Relative 26 %   Lymphs Abs 1.5 0.7 - 4.0 K/uL   Monocytes Relative 12 %   Monocytes Absolute 0.7 0.1 - 1.0 K/uL   Eosinophils Relative 4 %   Eosinophils Absolute 0.2 0.0 - 0.7 K/uL   Basophils Relative 1 %   Basophils Absolute 0.0 0.0 - 0.1 K/uL  Brain natriuretic peptide     Status: Abnormal   Collection Time: 10/26/15  9:30 AM  Result Value  Ref Range   B Natriuretic Peptide 839.1 (H) 0.0 - 100.0 pg/mL  I-stat troponin, ED     Status: None   Collection Time: 10/26/15  9:34 AM  Result Value Ref Range   Troponin i, poc 0.04 0.00 - 0.08 ng/mL   Comment 3          I-Stat Chem 8, ED     Status: Abnormal   Collection Time: 10/26/15  9:35 AM  Result Value Ref Range   Sodium 143 135 - 145 mmol/L   Potassium 3.8 3.5 - 5.1 mmol/L   Chloride 108 101 - 111 mmol/L   BUN  19 6 - 20 mg/dL   Creatinine, Ser 1.10 0.61 - 1.24 mg/dL   Glucose, Bld 140 (H) 65 - 99 mg/dL   Calcium, Ion 1.18 1.12 - 1.23 mmol/L   TCO2 25 0 - 100 mmol/L   Hemoglobin 13.3 13.0 - 17.0 g/dL   HCT 39.0 39.0 - 52.0 %  I-Stat CG4 Lactic Acid, ED     Status: None   Collection Time: 10/26/15  9:36 AM  Result Value Ref Range   Lactic Acid, Venous 1.28 0.5 - 1.9 mmol/L   Dg Chest Portable 1 View  Result Date: 10/26/2015 CLINICAL DATA:  Shortness of breath, wheezing beginning yesterday. EXAM: PORTABLE CHEST 1 VIEW COMPARISON:  07/09/2015 FINDINGS: Mild cardiomegaly. No evidence of overt edema. No confluent opacities or effusions. No acute bony abnormality. IMPRESSION: Cardiomegaly.  No active disease. Electronically Signed   By: Rolm Baptise M.D.   On: 10/26/2015 09:46    ASSESSMENT: 1. Acute diastolic HF with acute respiratory failure 2. Uncontrolled HTN (in setting of medication non-compliance) 3. Moderate aortic stenosis 4. Morbid obesity 5. Former smoker with probable COPD by exam  6. 1st degree AV block    PLAN/DISCUSSION:  Suspect main issue is diastolic HF in setting of severe HTN and moderate AS.   Will start IV lasix. Add spironolactone and IV hydralazine to help get BP down. Would avoid AV nodal blockers with marked 1AVB. Will need repeat echo.   [facility] to admit. We will follow.   Duriel Deery,MD 5:00 PM

## 2015-10-26 NOTE — H&P (Signed)
History and Physical    Robert Horn DOB: 1933-04-20 DOA: 10/26/2015  PCP: Leola Brazil, MD (Confirm with patient/family/NH records and if not entered, this has to be entered at [facility] point of entry) Patient coming from: Home  Chief Complaint: Dyspnea  HPI: Robert Horn is a 82 y.o. male with medical history significant of chronic diastolic heart failure, aortic stenosis, hypertension, hyperlipidemia. Patient presented to the [facility] ED after suffering continued episodes of dyspnea present at rest but exacerbated by movement. Patient reports no associated chest pain, palpitations, cough, or abdominal pain. He attributes this exacerbation to not having his blood pressure medication for the past two weeks. Symptoms are improved with rest.  ED Course: Patient received Levalbuterol and nitro paste in the ED. EKG significant for incomplete right bundle branch block and ST depression only in V6. Chest x-ray significant for cardiomegaly and no evidence of pulmonary edema. Creatinine stable at 1.11 with elevated BNP to 839 (No previous to compare). Cardiology consulted for evaluation and management and recommended aggressive diuresis.  Review of Systems: As per HPI otherwise 10 point review of systems negative.   Past Medical History:  Diagnosis Date  . Dyslipidemia   . Elevated PSA   . Erectile dysfunction   . Hypertension   . Hypogonadism male   . Obesity   . Pneumonia   . Rotator cuff tear     History reviewed. No pertinent surgical history.   reports that he has quit smoking. He does not have any smokeless tobacco history on file. He reports that he does not drink alcohol or use drugs.  No Known Allergies  History reviewed. No pertinent family history.   Prior to Admission medications   Medication Sig Start Date End Date Taking? Authorizing Provider  amLODipine (NORVASC) 10 MG tablet TAKE 1 TABLET BY MOUTH  DAILY. PLEASE KEEP  APPOINTMENT FOR FURTHER   REFILLS TO BE GRANTED 10/08/15  Yes [name], MD  aspirin (ASPIRIN EC) 81 MG EC tablet Take 81 mg by mouth daily. Swallow whole.   Yes Historical Provider, MD  metoprolol succinate (TOPROL-XL) 25 MG 24 hr tablet TAKE 1 TABLET BY MOUTH  DAILY. PLEASE KEEP  APPOINTMENT FOR FURTHER  REFILLS TO BE GRANTED 10/08/15  Yes [name], MD  Multiple Vitamin (MULTIVITAMIN) capsule Take 1 capsule by mouth daily.   Yes Historical Provider, MD  naproxen sodium (ANAPROX) 220 MG tablet Take 220 mg by mouth 2 (two) times daily as needed (pain).    Yes Historical Provider, MD  olmesartan-hydrochlorothiazide (BENICAR HCT) 40-12.5 MG tablet TAKE 1 TABLET BY MOUTH  DAILY. PLEASE KEEP  APPOINTMENT FOR FURTHER  REFILLS TO BE GRANTED 10/08/15  Yes [name], MD  Omega 3 1000 MG CAPS Take 1 capsule by mouth daily.   Yes Historical Provider, MD  VITAMIN B COMPLEX-C PO Take 1 tablet by mouth daily.   Yes Historical Provider, MD    Physical Exam: Vitals:   10/26/15 1630 10/26/15 1700 10/26/15 1738 10/26/15 1900  BP: (!) 176/110 (!) 175/102 (!) 173/107   Pulse: 87 78 85   Resp: 22 20 18    Temp:   97.5 F (36.4 C)   TempSrc:   Oral   SpO2: 94% 97% 96%   Weight:    110.6 kg (243 lb 12.8 oz)  Height:    5\' 10"  (1.778 m)      Constitutional: NAD, calm, comfortable Vitals:   10/26/15 1630 10/26/15 1700 10/26/15 1738 10/26/15 1900  BP: (!) 176/110 (!) 175/102 (!) 173/107   Pulse: 87 78 85   Resp: 22 20 18    Temp:   97.5 F (36.4 C)   TempSrc:   Oral   SpO2: 94% 97% 96%   Weight:    110.6 kg (243 lb 12.8 oz)  Height:    5\' 10"  (1.778 m)   Eyes: PERRL, lids and conjunctivae normal ENMT: Mucous membranes are moist. Posterior pharynx clear of any exudate or lesions.Normal dentition.  Neck: normal, supple, no masses, no thyromegaly Respiratory: mild bibasilar crackles. Normal respiratory effort. No accessory muscle use.  Cardiovascular: Regular rate and rhythm, systolic crescendo/decrescendo murmur. No  rubs / gallops. Trace to 1+ extremity edema. 2+ pedal pulses. No carotid bruits.  Abdomen: no tenderness, no masses palpated. No hepatosplenomegaly. Bowel sounds positive.  Musculoskeletal: no clubbing / cyanosis. No joint deformity upper and lower extremities. Good ROM, no contractures. Normal muscle tone.  Skin: no rashes, lesions, ulcers. No induration Neurologic: CN 2-12 grossly intact. Sensation intact, DTR normal. Strength 5/5 in all 4.  Psychiatric: Normal judgment and insight. Alert and oriented x 3. Normal mood.  Labs on Admission: I have personally reviewed following labs and imaging studies  CBC:  Recent Labs Lab 10/26/15 0929 10/26/15 0935  WBC 5.7  --   NEUTROABS 3.3  --   HGB 12.3* 13.3  HCT 38.5* 39.0  MCV 90.0  --   PLT PLATELET CLUMPS NOTED ON SMEAR, COUNT APPEARS ADEQUATE  --    Basic Metabolic Panel:  Recent Labs Lab 10/26/15 0929 10/26/15 0935  NA 141 143  K 3.7 3.8  CL 109 108  CO2 25  --   GLUCOSE 140* 140*  BUN 20 19  CREATININE 1.11 1.10  CALCIUM 8.9  --    GFR: Estimated Creatinine Clearance: 66.7 mL/min (by C-G formula based on SCr of 1.1 mg/dL). Liver Function Tests:  Recent Labs Lab 10/26/15 0929  AST 33  ALT 22  ALKPHOS 61  BILITOT 0.7  PROT 7.7  ALBUMIN 4.0   Radiological Exams on Admission: Dg Chest Portable 1 View  Result Date: 10/26/2015 CLINICAL DATA:  Shortness of breath, wheezing beginning yesterday. EXAM: PORTABLE CHEST 1 VIEW COMPARISON:  07/09/2015 FINDINGS: Mild cardiomegaly. No evidence of overt edema. No confluent opacities or effusions. No acute bony abnormality. IMPRESSION: Cardiomegaly.  No active disease. Electronically Signed   By: Rolm Baptise M.D.   On: 10/26/2015 09:46   EKG: Independently reviewed.  Assessment/Plan Principal Problem:   CHF exacerbation (HCC) Active Problems:   Essential hypertension   Aortic stenosis   Hyperlipidemia   Heart failure exacerbation Chronic diastolic heart  failure Patient's history consistent with diagnosis. X-ray unremarkable for significant effusion, however, physical exam seems more consistent. -cardiology following and have started on aggressive diuresis of lasix 80mg  BID and spironolactone -continue ARB (irbesartan while admitted) and amlodipine 10mg  -echocardiogram -hold metoprolol in setting of acute exacerbation since patient has not taken it in two weeks -strict in/out -daily weights -saline lock IV -repeat BMP in AM   Hypertension Not controlled. Will need to be considerate of aortic stenosis. -antihypertensive regimen as above  Hyperlipidemia -continue omega-3 acid  Hyperglycemia -Hemoglobin A1C -SSI sensitive  Moderate AS -repeat echocardiogram  DVT prophylaxis: Lovenoox Code Status: Full code Family Communication: Discussed with wife, Robert Horn, at bedside Disposition Plan: Discharge home pending improvement of symptoms after diuresis Consults called: Cardiology, Glori Bickers Admission status: Inpatient, telemetry   Cordelia Poche MD [facility]  If 7PM-7AM,  please contact night-coverage [email or web address] Password [facility]  10/26/2015, 9:25 PM

## 2015-10-26 NOTE — ED Triage Notes (Signed)
He c/o feeling short of breath since yesterday evening--worse this morning.  He denies pain; and he specifically denies chest pain or discomfort. He is short of breath and very nearly dyspneic with audible expiratory wheezes. He further tells me he has been out of his antihypertensive meds for 11 days. EKG performed at triage.

## 2015-10-27 ENCOUNTER — Inpatient Hospital Stay (HOSPITAL_COMMUNITY): Payer: Medicare Other

## 2015-10-27 ENCOUNTER — Encounter (HOSPITAL_COMMUNITY): Payer: Self-pay

## 2015-10-27 DIAGNOSIS — M75100 Unspecified rotator cuff tear or rupture of unspecified shoulder, not specified as traumatic: Secondary | ICD-10-CM

## 2015-10-27 DIAGNOSIS — E785 Hyperlipidemia, unspecified: Secondary | ICD-10-CM

## 2015-10-27 DIAGNOSIS — I509 Heart failure, unspecified: Secondary | ICD-10-CM

## 2015-10-27 DIAGNOSIS — I10 Essential (primary) hypertension: Secondary | ICD-10-CM

## 2015-10-27 DIAGNOSIS — N529 Male erectile dysfunction, unspecified: Secondary | ICD-10-CM

## 2015-10-27 DIAGNOSIS — R972 Elevated prostate specific antigen [PSA]: Secondary | ICD-10-CM

## 2015-10-27 DIAGNOSIS — J189 Pneumonia, unspecified organism: Secondary | ICD-10-CM

## 2015-10-27 DIAGNOSIS — I350 Nonrheumatic aortic (valve) stenosis: Secondary | ICD-10-CM

## 2015-10-27 DIAGNOSIS — I5043 Acute on chronic combined systolic (congestive) and diastolic (congestive) heart failure: Principal | ICD-10-CM

## 2015-10-27 DIAGNOSIS — E291 Testicular hypofunction: Secondary | ICD-10-CM

## 2015-10-27 DIAGNOSIS — R7989 Other specified abnormal findings of blood chemistry: Secondary | ICD-10-CM

## 2015-10-27 DIAGNOSIS — R799 Abnormal finding of blood chemistry, unspecified: Secondary | ICD-10-CM

## 2015-10-27 DIAGNOSIS — E669 Obesity, unspecified: Secondary | ICD-10-CM

## 2015-10-27 DIAGNOSIS — I5033 Acute on chronic diastolic (congestive) heart failure: Secondary | ICD-10-CM

## 2015-10-27 HISTORY — DX: Obesity, unspecified: E66.9

## 2015-10-27 HISTORY — DX: Pneumonia, unspecified organism: J18.9

## 2015-10-27 HISTORY — DX: Testicular hypofunction: E29.1

## 2015-10-27 HISTORY — DX: Essential (primary) hypertension: I10

## 2015-10-27 HISTORY — DX: Hyperlipidemia, unspecified: E78.5

## 2015-10-27 HISTORY — DX: Elevated prostate specific antigen (PSA): R97.20

## 2015-10-27 HISTORY — DX: Unspecified rotator cuff tear or rupture of unspecified shoulder, not specified as traumatic: M75.100

## 2015-10-27 HISTORY — DX: Male erectile dysfunction, unspecified: N52.9

## 2015-10-27 LAB — BASIC METABOLIC PANEL
ANION GAP: 7 (ref 5–15)
BUN: 23 mg/dL — ABNORMAL HIGH (ref 6–20)
CALCIUM: 8.8 mg/dL — AB (ref 8.9–10.3)
CO2: 26 mmol/L (ref 22–32)
CREATININE: 1.25 mg/dL — AB (ref 0.61–1.24)
Chloride: 107 mmol/L (ref 101–111)
GFR calc Af Amer: >60 mL/min (ref >60)
GFR, EST NON AFRICAN AMERICAN: 53 mL/min — AB (ref >60)
GLUCOSE: 115 mg/dL — AB (ref 65–99)
POTASSIUM: 3.9 mmol/L (ref 3.5–5.1)
Sodium: 140 mmol/L (ref 135–145)

## 2015-10-27 LAB — ECHOCARDIOGRAM COMPLETE
AO mean calculated velocity dopler: 215 cm/s
AV Area mean vel: 1.11 cm2
AV Mean grad: 21 mmHg
AV Peak grad: 37 mmHg
AV area mean vel ind: 0.49 cm2/m2
AV vel: 1.2
AVA: 1.2 cm2
AVAREAVTI: 1.16 cm2
AVAREAVTIIND: 0.53 cm2/m2
AVCELMEANRAT: 0.29
AVPKVEL: 304 cm/s
Ao pk vel: 0.31 m/s
CHL CUP AV PEAK INDEX: 0.51
CHL CUP AV VALUE AREA INDEX: 0.53
CHL CUP DOP CALC LVOT VTI: 20.1 cm
FS: 22 % — AB (ref 28–44)
HEIGHTINCHES: 70 in
IV/PV OW: 1.16
LA diam index: 2.25 cm/m2
LA vol A4C: 99.1 ml
LA vol: 111 mL
LASIZE: 51 mm
LAVOLIN: 48.9 mL/m2
LDCA: 3.8 cm2
LEFT ATRIUM END SYS DIAM: 51 mm
LVOTD: 22 mm
LVOTPV: 92.8 cm/s
LVOTSV: 76 mL
LVOTVTI: 0.31 cm
PW: 8.7 mm — AB (ref 0.6–1.1)
RV LATERAL S' VELOCITY: 13.5 cm/s
TAPSE: 21.4 mm
VTI: 63.9 cm
WEIGHTICAEL: 3900.8 [oz_av]

## 2015-10-27 LAB — GLUCOSE, CAPILLARY
Glucose-Capillary: 114 mg/dL — ABNORMAL HIGH (ref 65–99)
Glucose-Capillary: 125 mg/dL — ABNORMAL HIGH (ref 65–99)

## 2015-10-27 MED ORDER — DOCUSATE SODIUM 100 MG PO CAPS: 100.0000 mg | ORAL_CAPSULE | Freq: Two times a day (BID) | ORAL | 0 refills | Status: DC

## 2015-10-27 MED ORDER — HYDRALAZINE HCL 25 MG PO TABS: 25.0000 mg | ORAL_TABLET | Freq: Three times a day (TID) | ORAL | 1 refills | Status: DC

## 2015-10-27 MED ORDER — DOCUSATE SODIUM 100 MG PO CAPS
100.0000 mg | ORAL_CAPSULE | Freq: Two times a day (BID) | ORAL | Status: DC
  Filled 2015-10-27: qty 1

## 2015-10-27 MED ORDER — OMEGA-3-ACID ETHYL ESTERS 1 G PO CAPS: 1.0000 | ORAL_CAPSULE | Freq: Every day | ORAL | 1 refills | Status: DC

## 2015-10-27 MED ORDER — ADULT MULTIVITAMIN W/MINERALS CH: 1.0000 | ORAL_TABLET | Freq: Every day | ORAL | 1 refills | Status: DC

## 2015-10-27 MED ORDER — POLYETHYLENE GLYCOL 3350 17 G PO PACK
17.0000 g | PACK | Freq: Every day | ORAL | Status: DC
  Filled 2015-10-27: qty 1

## 2015-10-27 MED ORDER — POLYETHYLENE GLYCOL 3350 17 G PO PACK: 17.0000 g | PACK | Freq: Every day | ORAL | 0 refills | Status: AC

## 2015-10-27 NOTE — Progress Notes (Signed)
Reviewed echo images  LVEF is not normal I do not think this is an acute process He will need close f/u though  I will notify Dr [name]  I spoke to pt and wife  He is OK to go with medicines he is on now   INstructed to watch fluids and salt.  Take activites as tolerated Office witll contact.

## 2015-10-27 NOTE — Progress Notes (Signed)
  Echocardiogram 2D Echocardiogram has been performed.  Johny Chess 10/27/2015, 8:50 AM

## 2015-10-27 NOTE — Progress Notes (Signed)
Subjective: No CP  No SOB   Objective: Vitals:   10/26/15 1900 10/26/15 2157 10/27/15 0504 10/27/15 0510  BP:  112/73 (!) 143/131 (!) 143/131  Pulse:  70 82   Resp:  20    Temp:  98.6 F (37 C) 97.8 F (36.6 C)   TempSrc:  Oral Oral   SpO2:  97% 98%   Weight: 243 lb 12.8 oz (110.6 kg)     Height: 5\' 10"  (1.778 m)      Weight change:   Intake/Output Summary (Last 24 hours) at 10/27/15 0846 Last data filed at 10/26/15 2300  Gross per 24 hour  Intake              539 ml  Output             1800 ml  Net            -1261 ml    General: Alert, awake, oriented x3, in no acute distress Neck:  JVP is normal Heart: Regular rate and rhythm,  II/VI sytolic murmur at base   Lungs: Clear to auscultation. Upper airway wheez  Exemities:  No edema.   Neuro: Grossly intact, nonfocal. TeleL  SR    Lab Results: Results for orders placed or performed during the hospital encounter of 10/26/15 (from the past 24 hour(s))  Comprehensive metabolic panel     Status: Abnormal   Collection Time: 10/26/15  9:29 AM  Result Value Ref Range   Sodium 141 135 - 145 mmol/L   Potassium 3.7 3.5 - 5.1 mmol/L   Chloride 109 101 - 111 mmol/L   CO2 25 22 - 32 mmol/L   Glucose, Bld 140 (H) 65 - 99 mg/dL   BUN 20 6 - 20 mg/dL   Creatinine, Ser 1.11 0.61 - 1.24 mg/dL   Calcium 8.9 8.9 - 10.3 mg/dL   Total Protein 7.7 6.5 - 8.1 g/dL   Albumin 4.0 3.5 - 5.0 g/dL   AST 33 15 - 41 U/L   ALT 22 17 - 63 U/L   Alkaline Phosphatase 61 38 - 126 U/L   Total Bilirubin 0.7 0.3 - 1.2 mg/dL   GFR calc non Af Amer >60 >60 mL/min   GFR calc Af Amer >60 >60 mL/min   Anion gap 7 5 - 15  CBC with Differential     Status: Abnormal   Collection Time: 10/26/15  9:29 AM  Result Value Ref Range   WBC 5.7 4.0 - 10.5 K/uL   RBC 4.28 4.22 - 5.81 MIL/uL   Hemoglobin 12.3 (L) 13.0 - 17.0 g/dL   HCT 38.5 (L) 39.0 - 52.0 %   MCV 90.0 78.0 - 100.0 fL   MCH 28.7 26.0 - 34.0 pg   MCHC 31.9 30.0 - 36.0 g/dL   RDW 14.9 11.5 -  15.5 %   Platelets  150 - 400 K/uL    PLATELET CLUMPS NOTED ON SMEAR, COUNT APPEARS ADEQUATE   Neutrophils Relative % 57 %   Neutro Abs 3.3 1.7 - 7.7 K/uL   Lymphocytes Relative 26 %   Lymphs Abs 1.5 0.7 - 4.0 K/uL   Monocytes Relative 12 %   Monocytes Absolute 0.7 0.1 - 1.0 K/uL   Eosinophils Relative 4 %   Eosinophils Absolute 0.2 0.0 - 0.7 K/uL   Basophils Relative 1 %   Basophils Absolute 0.0 0.0 - 0.1 K/uL  Brain natriuretic peptide     Status: Abnormal   Collection Time: 10/26/15  9:30 AM  Result Value Ref Range   B Natriuretic Peptide 839.1 (H) 0.0 - 100.0 pg/mL  I-stat troponin, ED     Status: None   Collection Time: 10/26/15  9:34 AM  Result Value Ref Range   Troponin i, poc 0.04 0.00 - 0.08 ng/mL   Comment 3          I-Stat Chem 8, ED     Status: Abnormal   Collection Time: 10/26/15  9:35 AM  Result Value Ref Range   Sodium 143 135 - 145 mmol/L   Potassium 3.8 3.5 - 5.1 mmol/L   Chloride 108 101 - 111 mmol/L   BUN 19 6 - 20 mg/dL   Creatinine, Ser 1.10 0.61 - 1.24 mg/dL   Glucose, Bld 140 (H) 65 - 99 mg/dL   Calcium, Ion 1.18 1.12 - 1.23 mmol/L   TCO2 25 0 - 100 mmol/L   Hemoglobin 13.3 13.0 - 17.0 g/dL   HCT 39.0 39.0 - 52.0 %  I-Stat CG4 Lactic Acid, ED     Status: None   Collection Time: 10/26/15  9:36 AM  Result Value Ref Range   Lactic Acid, Venous 1.28 0.5 - 1.9 mmol/L  Glucose, capillary     Status: Abnormal   Collection Time: 10/26/15 11:27 PM  Result Value Ref Range   Glucose-Capillary 125 (H) 65 - 99 mg/dL   Comment 1 Notify RN   Basic metabolic panel     Status: Abnormal   Collection Time: 10/27/15  5:25 AM  Result Value Ref Range   Sodium 140 135 - 145 mmol/L   Potassium 3.9 3.5 - 5.1 mmol/L   Chloride 107 101 - 111 mmol/L   CO2 26 22 - 32 mmol/L   Glucose, Bld 115 (H) 65 - 99 mg/dL   BUN 23 (H) 6 - 20 mg/dL   Creatinine, Ser 1.25 (H) 0.61 - 1.24 mg/dL   Calcium 8.8 (L) 8.9 - 10.3 mg/dL   GFR calc non Af Amer 53 (L) >60 mL/min   GFR calc  Af Amer >60 >60 mL/min   Anion gap 7 5 - 15  Glucose, capillary     Status: Abnormal   Collection Time: 10/27/15  7:18 AM  Result Value Ref Range   Glucose-Capillary 114 (H) 65 - 99 mg/dL    Studies/Results: Dg Chest Portable 1 View  Result Date: 10/26/2015 CLINICAL DATA:  Shortness of breath, wheezing beginning yesterday. EXAM: PORTABLE CHEST 1 VIEW COMPARISON:  07/09/2015 FINDINGS: Mild cardiomegaly. No evidence of overt edema. No confluent opacities or effusions. No acute bony abnormality. IMPRESSION: Cardiomegaly.  No active disease. Electronically Signed   By: Rolm Baptise M.D.   On: 10/26/2015 09:46   Medications: Reviewed   @PROBHOSP @  1  Diastolic CHF  Volume is not bad  I would not give anymore IV lasix for now.   Pt had been out of meds for 2 wks (mix up with mail pharmacy and office)  I think this precipitated all of above   Would  Continue meds   Check echo  2.  Aortic stenosis  REview echo    3.  HTN  Just got meds  BP now manually is 168/82  WIll recheck   Possible home today with close outpt f/u     LOS: 1 day   Robert Horn 10/27/2015, 8:46 AM

## 2015-10-27 NOTE — Discharge Summary (Signed)
Physician Discharge Summary  Robert Horn [id number] DOB: [birth year] DOA: 10/26/2015  PCP: Leola Brazil, MD  Admit date: 10/26/2015 Discharge date: 10/27/2015  Time spent: Greater than 30 minutes  Recommendations for Outpatient Follow-up:  1. Discharge patient home. 2. Follow up with PCP within one week. 3. Follow up with Cardiology, Dr. Dorris Carnes as soon as possible.   Discharge Diagnoses:  Principal Problem:   CHF exacerbation ([place]), combined systolic and diastolic Active Problems:   Essential hypertension   Aortic stenosis, moderate   Hyperlipidemia   Elevated brain natriuretic peptide (BNP) level   Discharge Condition: Stable  Diet recommendation: Cardiac.  Filed Weights   10/26/15 1900 10/27/15 1205  Weight: 110.6 kg (243 lb 12.8 oz) 107.5 kg (237 lb)    History of present illness: 82 year old male with medical history significant for chronic diastolic heart failure, aortic stenosis, hypertension, hyperlipidemia. Patient presented to the [facility] ED after suffering continued episodes of dyspnea. Patient had been non compliant and hadn't taken his medications for about 2 weeks.  CXR revealed cardiomegaly and BNP is elevated (839).   Hospital Course: Patient was admitted for further assessment and management. Patient was diuresed. Cardiology team was consulted. ECHO done revealed estimated ejection fraction of of 40% to 45%, diffuse hypokinesis, features consistent with pseudonormal left ventricular   filling pattern, with concomitant abnormal relaxation and   increased filling pressure (grade 2 diastolic dysfunction). Moderate aortic stenosis was also reported. Patient has improved significantly, and is eager to be discharged back home. Cardiology team has cleared patient for discharge.  Procedures:  None  Consultations:  Cardiology, Dr. Dorris Carnes  Discharge Exam: Vitals:   10/27/15 0510 10/27/15 1341  BP: (!) 143/131 (!) 146/80  Pulse:  75   Resp:  20  Temp:      General: Not in distress. AAO X 3. Cardiovascular: S1S2, ESM Respiratory: Clear to auscultation  Discharge Instructions   Discharge Instructions    Diet - low sodium heart healthy    Complete by:  As directed   Discharge instructions    Complete by:  As directed   Follow up with PCP within one week. Follow up with Cardiology as soon as possible   Increase activity slowly    Complete by:  As directed     Current Discharge Medication List    START taking these medications   Details  docusate sodium (COLACE) 100 MG capsule Take 1 capsule (100 mg total) by mouth 2 (two) times daily. Qty: 60 capsule, Refills: 0    hydrALAZINE (APRESOLINE) 25 MG tablet Take 1 tablet (25 mg total) by mouth every 8 (eight) hours. Qty: 90 tablet, Refills: 1    Multiple Vitamin (MULTIVITAMIN WITH MINERALS) TABS tablet Take 1 tablet by mouth daily. Qty: 30 tablet, Refills: 1    omega-3 acid ethyl esters (LOVAZA) 1 g capsule Take 1 capsule (1 g total) by mouth daily. Qty: 30 capsule, Refills: 1    polyethylene glycol (MIRALAX / GLYCOLAX) packet Take 17 g by mouth daily. Qty: 30 each, Refills: 0      CONTINUE these medications which have NOT CHANGED   Details  amLODipine (NORVASC) 10 MG tablet TAKE 1 TABLET BY MOUTH  DAILY. PLEASE KEEP  APPOINTMENT FOR FURTHER  REFILLS TO BE GRANTED Qty: 40 tablet, Refills: 0    aspirin (ASPIRIN EC) 81 MG EC tablet Take 81 mg by mouth daily. Swallow whole.    metoprolol succinate (TOPROL-XL) 25 MG 24 hr tablet TAKE  1 TABLET BY MOUTH  DAILY. PLEASE KEEP  APPOINTMENT FOR FURTHER  REFILLS TO BE GRANTED Qty: 40 tablet, Refills: 0    olmesartan-hydrochlorothiazide (BENICAR HCT) 40-12.5 MG tablet TAKE 1 TABLET BY MOUTH  DAILY. PLEASE KEEP  APPOINTMENT FOR FURTHER  REFILLS TO BE GRANTED Qty: 40 tablet, Refills: 0    VITAMIN B COMPLEX-C PO Take 1 tablet by mouth daily.      STOP taking these medications     Multiple Vitamin (MULTIVITAMIN)  capsule      naproxen sodium (ANAPROX) 220 MG tablet      Omega 3 1000 MG CAPS        No Known Allergies Follow-up Information    KILPATRICK JR,GEORGE R, MD Follow up in 1 week(s).   Specialty:  Pulmonary Disease Contact information: [place] [address] [phone]        Dorris Carnes, MD. Schedule an appointment as soon as possible for a visit in 2 week(s).   Specialty:  Cardiology Contact information: [place] [address] [phone]            The results of significant diagnostics from this hospitalization (including imaging, microbiology, ancillary and laboratory) are listed below for reference.    Significant Diagnostic Studies: Dg Chest Portable 1 View  Result Date: 10/26/2015 CLINICAL DATA:  Shortness of breath, wheezing beginning yesterday. EXAM: PORTABLE CHEST 1 VIEW COMPARISON:  07/09/2015 FINDINGS: Mild cardiomegaly. No evidence of overt edema. No confluent opacities or effusions. No acute bony abnormality. IMPRESSION: Cardiomegaly.  No active disease. Electronically Signed   By: Rolm Baptise M.D.   On: 10/26/2015 09:46   Microbiology: No results found for this or any previous visit (from the past 240 hour(s)).   Labs: Basic Metabolic Panel:  Recent Labs Lab 10/26/15 0929 10/26/15 0935 10/27/15 0525  NA 141 143 140  K 3.7 3.8 3.9  CL 109 108 107  CO2 25  --  26  GLUCOSE 140* 140* 115*  BUN 20 19 23*  CREATININE 1.11 1.10 1.25*  CALCIUM 8.9  --  8.8*   Liver Function Tests:  Recent Labs Lab 10/26/15 0929  AST 33  ALT 22  ALKPHOS 61  BILITOT 0.7  PROT 7.7  ALBUMIN 4.0   No results for input(s): LIPASE, AMYLASE in the last 168 hours. No results for input(s): AMMONIA in the last 168 hours. CBC:  Recent Labs Lab 10/26/15 0929 10/26/15 0935  WBC 5.7  --   NEUTROABS 3.3  --   HGB 12.3* 13.3  HCT 38.5* 39.0  MCV 90.0  --   PLT PLATELET CLUMPS NOTED ON SMEAR, COUNT APPEARS  ADEQUATE  --    Cardiac Enzymes: No results for input(s): CKTOTAL, CKMB, CKMBINDEX, TROPONINI in the last 168 hours. BNP: BNP (last 3 results)  Recent Labs  10/26/15 0930  BNP 839.1*    ProBNP (last 3 results) No results for input(s): PROBNP in the last 8760 hours.  CBG:  Recent Labs Lab 10/26/15 2327 10/27/15 0718 10/27/15 1159  GLUCAP 125* 114* 125*       Signed:  Dana Allan, MD  [facility] Pager #: [phone] 7PM-7AM contact night coverage as above

## 2015-10-27 NOTE — Progress Notes (Signed)
Pt c/o SOB. He was put on 2LPM by [place]. The door to his room was also opened to let some circulating air inside. Pt He denied SOB for the rest of the shift.

## 2015-10-28 ENCOUNTER — Telehealth: Payer: Self-pay

## 2015-10-28 LAB — HEMOGLOBIN A1C
Hgb A1c MFr Bld: 6.4 % — ABNORMAL HIGH (ref 4.8–5.6)
MEAN PLASMA GLUCOSE: 137 mg/dL

## 2015-10-28 NOTE — Telephone Encounter (Signed)
Per Dr.Ross this pt needs to be seen by Dr.Smith asap. lmom at pt home # and with receptionist at the patients office for him to call back today. Per Dr.Smith he could see the pt in the morning 8/1 around 10:30am

## 2015-10-28 NOTE — Telephone Encounter (Signed)
Pt aware of appt scheduled with Dr.Smith for 8/1 @ 10:30am. Pt verbalized understanding verified our office location. Pt sts that he will be there.

## 2015-10-29 ENCOUNTER — Ambulatory Visit (INDEPENDENT_AMBULATORY_CARE_PROVIDER_SITE_OTHER): Payer: Medicare Other | Admitting: Interventional Cardiology

## 2015-10-29 ENCOUNTER — Encounter: Payer: Self-pay | Admitting: Interventional Cardiology

## 2015-10-29 VITALS — BP 142/86 | HR 69 | Ht 72.0 in | Wt 241.8 lb

## 2015-10-29 DIAGNOSIS — E785 Hyperlipidemia, unspecified: Secondary | ICD-10-CM

## 2015-10-29 DIAGNOSIS — I493 Ventricular premature depolarization: Secondary | ICD-10-CM

## 2015-10-29 DIAGNOSIS — I350 Nonrheumatic aortic (valve) stenosis: Secondary | ICD-10-CM | POA: Diagnosis not present

## 2015-10-29 DIAGNOSIS — I5032 Chronic diastolic (congestive) heart failure: Secondary | ICD-10-CM | POA: Diagnosis not present

## 2015-10-29 DIAGNOSIS — I10 Essential (primary) hypertension: Secondary | ICD-10-CM | POA: Diagnosis not present

## 2015-10-29 DIAGNOSIS — N182 Chronic kidney disease, stage 2 (mild): Secondary | ICD-10-CM

## 2015-10-29 MED ORDER — FUROSEMIDE 40 MG PO TABS: 40.0000 mg | ORAL_TABLET | Freq: Every day | ORAL | 0 refills | Status: DC

## 2015-10-29 MED ORDER — OLMESARTAN MEDOXOMIL 40 MG PO TABS: 40.0000 mg | ORAL_TABLET | Freq: Every day | ORAL | 3 refills | Status: DC

## 2015-10-29 MED ORDER — FUROSEMIDE 40 MG PO TABS: 40.0000 mg | ORAL_TABLET | Freq: Every day | ORAL | 3 refills | Status: DC

## 2015-10-29 MED ORDER — OLMESARTAN MEDOXOMIL 40 MG PO TABS: 40.0000 mg | ORAL_TABLET | Freq: Every day | ORAL | 0 refills | Status: DC

## 2015-10-29 NOTE — Progress Notes (Signed)
Cardiology Office Note    Date:  10/29/2015   ID:  Robert Horn, DOB 11/25/1933, MRN [id number]  PCP:  Leola Brazil, MD  Cardiologist: Sinclair Grooms, MD   Chief Complaint  Patient presents with  . Congestive Heart Failure    History of Present Illness:  Robert Horn is a 82 y.o. male follow-up of acute CHF episode occurring 72 hours ago. Has history of chronic combined systolic and diastolic heart failure, hypertension, erectile dysfunction, mild calcific aortic stenosis and hyperlipidemia.  [name] is doing better now. He presented early Saturday morning into the [facility] emergency room extremely short of breath. On chest x-ray he had mild pulmonary congestion. BNP was 840. He was given IV Lasix and had a dramatic diuresis and felt great. He states that following the hospitalization his breathing was better than admitted been in years. Now 2 days following discharge he is a little more short of breath and he was when he left the hospital. He denies chest pain either at the time of presentation or previously.  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 He had not taken his antihypertensive therapy for up to a week prior to admission. He is now back on his medication.   Past Medical History:  Diagnosis Date  . Dyslipidemia 10/27/2015  . Elevated PSA 10/27/2015  . Erectile dysfunction 10/27/2015  . Hypertension 10/27/2015  . Hypogonadism male 10/27/2015  . Obesity 10/27/2015  . Pneumonia 10/27/2015  . Rotator cuff tear 10/27/2015    No past surgical history on file.  Current Medications: Outpatient Medications Prior to Visit  Medication Sig Dispense Refill  . amLODipine (NORVASC) 10 MG tablet TAKE 1 TABLET BY MOUTH  DAILY. PLEASE KEEP  APPOINTMENT FOR FURTHER  REFILLS TO BE GRANTED 40 tablet 0  . aspirin (ASPIRIN EC) 81 MG EC tablet Take 81 mg by mouth daily. Swallow whole.    . docusate  sodium (COLACE) 100 MG capsule Take 1 capsule (100 mg total) by mouth 2 (two) times daily. 60 capsule 0  . hydrALAZINE (APRESOLINE) 25 MG tablet Take 1 tablet (25 mg total) by mouth every 8 (eight) hours. 90 tablet 1  . metoprolol succinate (TOPROL-XL) 25 MG 24 hr tablet TAKE 1 TABLET BY MOUTH  DAILY. PLEASE KEEP  APPOINTMENT FOR FURTHER  REFILLS TO BE GRANTED 40 tablet 0  . Multiple Vitamin (MULTIVITAMIN WITH MINERALS) TABS tablet Take 1 tablet by mouth daily. 30 tablet 1  . omega-3 acid ethyl esters (LOVAZA) 1 g capsule Take 1 capsule (1 g total) by mouth daily. 30 capsule 1  . polyethylene glycol (MIRALAX / GLYCOLAX) packet Take 17 g by mouth daily. 30 each 0  . VITAMIN B COMPLEX-C PO Take 1 tablet by mouth daily.    Marland Kitchen olmesartan-hydrochlorothiazide (BENICAR HCT) 40-12.5 MG tablet TAKE 1 TABLET BY MOUTH  DAILY. PLEASE KEEP  APPOINTMENT FOR FURTHER  REFILLS TO BE GRANTED 40 tablet 0   No facility-administered medications prior to visit.      Allergies:   Review of patient's allergies indicates no known allergies.   Social History   Social History  . Marital status: Single    Spouse name: N/A  . Number of children: N/A  . Years of education: N/A   Social History Main Topics  . Smoking status: Former Smoker    Types: Cigarettes  . Smokeless tobacco: Never Used  . Alcohol use No  .  Drug use: No  . Sexual activity: Not Asked   Other Topics Concern  . None   Social History Narrative  . None     Family History:  The patient's family history includes Diabetes in his mother; Heart disease in his mother; Pulmonary embolism in his father.   ROS:   Please see the history of present illness.    Each salt heavily in his diet. Volume intake is been excessive.  All other systems reviewed and are negative.   PHYSICAL EXAM:   VS:  BP (!) 142/86   Pulse 69   Ht 6' (1.829 m)   Wt 241 lb 12.8 oz (109.7 kg)   BMI 32.79 kg/m    GEN: Well nourished, well developed, in no acute distress   HEENT: normal  Neck: no JVD, carotid bruits, or masses Cardiac: RRR, rubs, or gallops,no edema . Mildly tachycardic. An S4 gallop is audible. Systolic murmur is heard. No diastolic murmurs heard. Respiratory:  clear to auscultation bilaterally, normal work of breathing GI: soft, nontender, nondistended, + BS MS: no deformity or atrophy  Skin: warm and dry, no rash Neuro:  Alert and Oriented x 3, Strength and sensation are intact Psych: euthymic mood, full affect  Wt Readings from Last 3 Encounters:  10/29/15 241 lb 12.8 oz (109.7 kg)  10/27/15 237 lb (107.5 kg)  05/09/13 250 lb (113.4 kg)      Studies/Labs Reviewed:   EKG:  EKG  Normal sinus rhythm, rightward axis, incomplete right bundle branch block, Paul wave progression V1 through V5 compatible with anterior infarction of undetermined age  Recent Labs: 10/26/2015: ALT 22; B Natriuretic Peptide 839.1; Hemoglobin 13.3; Platelets PLATELET CLUMPS NOTED ON SMEAR, COUNT APPEARS ADEQUATE 10/27/2015: BUN 23; Creatinine, Ser 1.25; Potassium 3.9; Sodium 140   Lipid Panel No results found for: CHOL, TRIG, HDL, CHOLHDL, VLDL, LDLCALC, LDLDIRECT  Additional studies/ records that were reviewed today include:  Echocardiogram, 10/27/2015 Study Conclusions  - Left ventricle: The cavity size was mildly dilated. Systolic   function was mildly to moderately reduced. The estimated ejection   fraction was in the range of 40% to 45%. Diffuse hypokinesis.   Features are consistent with a pseudonormal left ventricular   filling pattern, with concomitant abnormal relaxation and   increased filling pressure (grade 2 diastolic dysfunction). - Aortic valve: Valve mobility was restricted. There was moderate   stenosis. There was mild regurgitation. Peak velocity (S): 343   cm/s. Mean gradient (S): 30 mm Hg. Valve area (VTI): 0.96 cm^2.   Valve area (Vmax): 1.03 cm^2. Valve area (Vmean): 0.92 cm^2. - Mitral valve: There was mild regurgitation. - Left  atrium: The atrium was severely dilated. - Right ventricle: The cavity size was normal. Wall thickness was   normal. Systolic function was normal. - Tricuspid valve: There was trivial regurgitation. - Inferior vena cava: The vessel was normal in size. The   respirophasic diameter changes were in the normal range (>= 50%),   consistent with normal central venous pressure.   ASSESSMENT:    1. Chronic diastolic heart failure ([place])   2. Aortic stenosis   3. Essential hypertension   4. CKD (chronic kidney disease) stage 2, GFR 60-89 ml/min   5. PVC's (premature ventricular contractions)   6. Hyperlipidemia      PLAN:  In order of problems listed above:  1. No evidence of volume overload but all clinical data suggests an episode of heart failure. This is probably mostly precipitated by noncompliance with medical  regimen and dietary indiscretion. I cannot exclude the possibility of coronary artery disease especially given abnormalities noted on the patient's EKG. He did rule out for myocardial infarction. Echo also demonstrated that LV function has decreased into the 45% range. He now has chronic combined systolic and diastolic heart failure. I have recommended left and right heart catheterization with coronary angiography to fully investigate this episode of congestive heart failure. I will discontinue hydrochlorothiazide and start furosemide 40 mg daily. He will need to have a basic metabolic panel 1 week after starting furosemide. Cardiac catheterization will be done in 2-3 weeks. 2. Based upon the echo, aortic valve disease is mild to moderate and unchanged when compared to prior. 3. Pressure is much better controlled today. The adjustment in medical regimen we'll probably allow [place] to discontinue hydralazine. The new regimen will be beta blocker therapy (which should probably be switched to carvedilol), Benicar, furosemide 40 mg daily, and amlodipine. Unless absolutely necessary, we will try to  do without hydralazine. 4. I noticed after diuresis that there was mild impairment in kidney numbers. After being placed on chronic Lasix therapy for about a week. We will recheck a basic metabolic panel.  The patient was counseled to undergo left heart catheterization, coronary angiography, and possible percutaneous coronary intervention with stent implantation. The procedural risks and benefits were discussed in detail. The risks discussed included death, stroke, myocardial infarction, life-threatening bleeding, limb ischemia, kidney injury, allergy, and possible emergency cardiac surgery. The risk of these significant complications were estimated to occur less than 1% of the time. After discussion, the patient has agreed to proceed.  Medication Adjustments/Labs and Tests Ordered: Current medicines are reviewed at length with the patient today.  Concerns regarding medicines are outlined above.  Medication changes, Labs and Tests ordered today are listed in the Patient Instructions below. Patient Instructions  Medication Instructions:  1) DISCONTINUE Benicar/HCT 2) START Benicar 40mg  once daily 3) START Furosemide 40mg  once daily  Labwork: Your physician recommends that you return for lab work on Monday 8/7.   Testing/Procedures: Your physician has requested that you have a cardiac catheterization. Cardiac catheterization is used to diagnose and/or treat various heart conditions. Doctors may recommend this procedure for a number of different reasons. The most common reason is to evaluate chest pain. Chest pain can be a symptom of coronary artery disease (CAD), and cardiac catheterization can show whether plaque is narrowing or blocking your heart's arteries. This procedure is also used to evaluate the valves, as well as measure the blood flow and oxygen levels in different parts of your heart. For further information please visit [facility]uge[facility]iesta[facility]tn. Please follow instruction sheet, as  given.  Available dates for Dr. [name] are 8/16, 8/17, 8/21.  Please call our office and let us know which of these dates work best for you.  Follow-Up: Will be determined after your catheterization.  Any Other Special Instructions Will Be Listed Below (If Applicable).     If you need a refill on your cardiac medications before your next appointment, please call your pharmacy.      Signed, Sinclair Grooms, MD  10/29/2015 12:18 PM    [facility] [address], [place], [place]  [address] Phone: [phone]; Fax: [phone]

## 2015-10-29 NOTE — Patient Instructions (Signed)
Medication Instructions:  1) DISCONTINUE Benicar/HCT 2) START Benicar 40mg  once daily 3) START Furosemide 40mg  once daily  Labwork: Your physician recommends that you return for lab work on Monday 8/7.   Testing/Procedures: Your physician has requested that you have a cardiac catheterization. Cardiac catheterization is used to diagnose and/or treat various heart conditions. Doctors may recommend this procedure for a number of different reasons. The most common reason is to evaluate chest pain. Chest pain can be a symptom of coronary artery disease (CAD), and cardiac catheterization can show whether plaque is narrowing or blocking your heart's arteries. This procedure is also used to evaluate the valves, as well as measure the blood flow and oxygen levels in different parts of your heart. For further information please visit [facility]uge[facility]iest[employer]tn. Please follow instruction sheet, as given.  Available dates for Dr. [name] are 8/16, 8/17, 8/21.  Please call our office and let us know which of these dates work best for you.  Follow-Up: Will be determined after your catheterization.  Any Other Special Instructions Will Be Listed Below (If Applicable).     If you need a refill on your cardiac medications before your next appointment, please call your pharmacy.

## 2015-11-04 ENCOUNTER — Other Ambulatory Visit (INDEPENDENT_AMBULATORY_CARE_PROVIDER_SITE_OTHER): Payer: Medicare Other

## 2015-11-04 DIAGNOSIS — I350 Nonrheumatic aortic (valve) stenosis: Secondary | ICD-10-CM

## 2015-11-04 DIAGNOSIS — I5032 Chronic diastolic (congestive) heart failure: Secondary | ICD-10-CM

## 2015-11-04 DIAGNOSIS — I10 Essential (primary) hypertension: Secondary | ICD-10-CM

## 2015-11-05 LAB — BASIC METABOLIC PANEL
BUN: 22 mg/dL (ref 7–25)
CALCIUM: 9.3 mg/dL (ref 8.6–10.3)
CHLORIDE: 103 mmol/L (ref 98–110)
CO2: 26 mmol/L (ref 20–31)
CREATININE: 1.21 mg/dL — AB (ref 0.70–1.11)
GLUCOSE: 86 mg/dL (ref 65–99)
POTASSIUM: 4.2 mmol/L (ref 3.5–5.3)
Sodium: 139 mmol/L (ref 135–146)

## 2015-11-06 ENCOUNTER — Telehealth: Payer: Self-pay

## 2015-11-06 DIAGNOSIS — Z01812 Encounter for preprocedural laboratory examination: Secondary | ICD-10-CM

## 2015-11-06 NOTE — Telephone Encounter (Addendum)
Patient was to contact the office with the date he would like to have his cardiac cath scheduled. Pt rqst 8/17 in the afternoon.  Patients cath is scheduled for 11/14/15 @ 12pm with Dr.Smith.  Patient had a bmet on 8/7. He will need his otherpre-procedure (Cbc,Pt/Inr)  Lmtcb. Pt will need a lab appt and to be given pre cath instructions.   Pt will need to HOLD Benicar and Lasix on the day of his cath

## 2015-11-06 NOTE — Telephone Encounter (Signed)
-----   Message from [name], MD sent at 11/06/2015  8:59 AM EDT ----- Let the patient know blood work is stable. Make sure he holds furosemide and Benicar on day of cath. A copy will be sent to Kandice Hams, MD

## 2015-11-07 NOTE — Telephone Encounter (Signed)
Follow up ° ° ° ° ° °Returning a call to the nurse °

## 2015-11-07 NOTE — Telephone Encounter (Signed)
Spoke with patient and reviewed lab work and instructions from Dr. [name] for patient to hold furosemide and benicar on Thursday 8/17 prior to heart cath.  I reviewed pre-procedure instructions with the patient including NPO status after midnight except for remaining medications with sip of water, location and time of arrival, and to have someone to responsible to drive him if he is discharged.  I advised him to call back prior to next Thursday if he has additional questions or concerns.  He verbalized understanding and agreement and thanked me for the call.

## 2015-11-08 ENCOUNTER — Telehealth: Payer: Self-pay

## 2015-11-08 DIAGNOSIS — Z01812 Encounter for preprocedural laboratory examination: Secondary | ICD-10-CM

## 2015-11-08 NOTE — Telephone Encounter (Signed)
Pt returned call and request that I speak with his wife regarding his upcoming cardiac cath.  Pt's wife aware that the pt is scheduled to have a cardiac cath on 8/17 @ 12pm with Dr.Smith @ [facility]. He is to arrive 2 hrs prior.  Pt will come to the office on 8/14 for additional pre cath labs. Pt wife given verbal pre-procedure instructions. NPO after midnight HOLD Benicar and Lasix the morning of procedure.  Adv that the written instructions will be left at the front desk to be picked up when the patient comes in for labs on 8/14. They are to call back if any questions.  Mrs. Vita Erm verbalized understanding to the instructions given.

## 2015-11-08 NOTE — Telephone Encounter (Signed)
2nd attempt. lmtcb with patients wife

## 2015-11-11 ENCOUNTER — Other Ambulatory Visit: Payer: Medicare Other | Admitting: *Deleted

## 2015-11-11 DIAGNOSIS — Z7901 Long term (current) use of anticoagulants: Secondary | ICD-10-CM | POA: Diagnosis not present

## 2015-11-11 DIAGNOSIS — Z01812 Encounter for preprocedural laboratory examination: Secondary | ICD-10-CM | POA: Diagnosis not present

## 2015-11-11 DIAGNOSIS — Z79899 Other long term (current) drug therapy: Secondary | ICD-10-CM | POA: Diagnosis not present

## 2015-11-11 LAB — CBC WITH DIFFERENTIAL/PLATELET
Basophils Absolute: 55 cells/uL (ref 0–200)
Basophils Relative: 1 %
EOS ABS: 110 {cells}/uL (ref 15–500)
EOS PCT: 2 %
HCT: 39.3 % (ref 38.5–50.0)
HEMOGLOBIN: 12.7 g/dL — AB (ref 13.2–17.1)
Lymphs Abs: 2200 cells/uL (ref 850–3900)
MCH: 28.7 pg (ref 27.0–33.0)
MCHC: 32.3 g/dL (ref 32.0–36.0)
MCV: 88.9 fL (ref 80.0–100.0)
MONO ABS: 825 {cells}/uL (ref 200–950)
MPV: 11.9 fL (ref 7.5–12.5)
Monocytes Relative: 15 %
NEUTROS ABS: 2310 {cells}/uL (ref 1500–7800)
Neutrophils Relative %: 42 %
Platelets: 146 10*3/uL (ref 140–400)
RBC: 4.42 MIL/uL (ref 4.20–5.80)
RDW: 14.6 % (ref 11.0–15.0)
WBC: 5.5 10*3/uL (ref 3.8–10.8)

## 2015-11-12 ENCOUNTER — Other Ambulatory Visit: Payer: Self-pay | Admitting: Interventional Cardiology

## 2015-11-12 DIAGNOSIS — I5021 Acute systolic (congestive) heart failure: Secondary | ICD-10-CM

## 2015-11-12 LAB — PROTIME-INR
INR: 1
Prothrombin Time: 10.8 s (ref 9.0–11.5)

## 2015-11-14 ENCOUNTER — Encounter (HOSPITAL_COMMUNITY)
Admission: RE | Disposition: A | Discharge status: Home / Self Care | Payer: Self-pay | Source: Ambulatory Visit | Attending: Interventional Cardiology

## 2015-11-14 ENCOUNTER — Ambulatory Visit (HOSPITAL_COMMUNITY)
Admission: RE | Admit: 2015-11-14 | Discharge: 2015-11-14 | Disposition: A | Discharge status: Home / Self Care | Payer: Medicare Other | Source: Ambulatory Visit | Attending: Interventional Cardiology | Admitting: Interventional Cardiology

## 2015-11-14 ENCOUNTER — Other Ambulatory Visit: Payer: Self-pay | Admitting: Interventional Cardiology

## 2015-11-14 DIAGNOSIS — N529 Male erectile dysfunction, unspecified: Secondary | ICD-10-CM | POA: Insufficient documentation

## 2015-11-14 DIAGNOSIS — Z8249 Family history of ischemic heart disease and other diseases of the circulatory system: Secondary | ICD-10-CM | POA: Insufficient documentation

## 2015-11-14 DIAGNOSIS — I4510 Unspecified right bundle-branch block: Secondary | ICD-10-CM | POA: Insufficient documentation

## 2015-11-14 DIAGNOSIS — E669 Obesity, unspecified: Secondary | ICD-10-CM | POA: Insufficient documentation

## 2015-11-14 DIAGNOSIS — Z6832 Body mass index (BMI) 32.0-32.9, adult: Secondary | ICD-10-CM | POA: Insufficient documentation

## 2015-11-14 DIAGNOSIS — I5022 Chronic systolic (congestive) heart failure: Secondary | ICD-10-CM

## 2015-11-14 DIAGNOSIS — Z87891 Personal history of nicotine dependence: Secondary | ICD-10-CM | POA: Diagnosis not present

## 2015-11-14 DIAGNOSIS — I493 Ventricular premature depolarization: Secondary | ICD-10-CM | POA: Diagnosis not present

## 2015-11-14 DIAGNOSIS — I5042 Chronic combined systolic (congestive) and diastolic (congestive) heart failure: Secondary | ICD-10-CM | POA: Diagnosis not present

## 2015-11-14 DIAGNOSIS — I110 Hypertensive heart disease with heart failure: Secondary | ICD-10-CM | POA: Diagnosis present

## 2015-11-14 DIAGNOSIS — E785 Hyperlipidemia, unspecified: Secondary | ICD-10-CM | POA: Diagnosis not present

## 2015-11-14 DIAGNOSIS — I350 Nonrheumatic aortic (valve) stenosis: Secondary | ICD-10-CM | POA: Diagnosis not present

## 2015-11-14 DIAGNOSIS — E291 Testicular hypofunction: Secondary | ICD-10-CM | POA: Insufficient documentation

## 2015-11-14 DIAGNOSIS — Z833 Family history of diabetes mellitus: Secondary | ICD-10-CM | POA: Insufficient documentation

## 2015-11-14 DIAGNOSIS — I5021 Acute systolic (congestive) heart failure: Secondary | ICD-10-CM

## 2015-11-14 DIAGNOSIS — N182 Chronic kidney disease, stage 2 (mild): Secondary | ICD-10-CM | POA: Diagnosis not present

## 2015-11-14 DIAGNOSIS — I080 Rheumatic disorders of both mitral and aortic valves: Secondary | ICD-10-CM | POA: Diagnosis not present

## 2015-11-14 DIAGNOSIS — I130 Hypertensive heart and chronic kidney disease with heart failure and stage 1 through stage 4 chronic kidney disease, or unspecified chronic kidney disease: Secondary | ICD-10-CM | POA: Diagnosis not present

## 2015-11-14 DIAGNOSIS — R7989 Other specified abnormal findings of blood chemistry: Secondary | ICD-10-CM | POA: Diagnosis present

## 2015-11-14 DIAGNOSIS — Z7982 Long term (current) use of aspirin: Secondary | ICD-10-CM | POA: Diagnosis not present

## 2015-11-14 HISTORY — PX: CARDIAC CATHETERIZATION: SHX172

## 2015-11-14 LAB — POCT I-STAT 3, VENOUS BLOOD GAS (G3P V)
Acid-base deficit: 1 mmol/L (ref 0.0–2.0)
Bicarbonate: 25.2 mEq/L — ABNORMAL HIGH (ref 20.0–24.0)
O2 SAT: 68 %
PCO2 VEN: 45.4 mmHg (ref 45.0–50.0)
PO2 VEN: 37 mmHg (ref 31.0–45.0)
TCO2: 27 mmol/L (ref 0–100)
pH, Ven: 7.353 — ABNORMAL HIGH (ref 7.250–7.300)

## 2015-11-14 LAB — POCT I-STAT 3, ART BLOOD GAS (G3+)
ACID-BASE EXCESS: 1 mmol/L (ref 0.0–2.0)
BICARBONATE: 25.8 meq/L — AB (ref 20.0–24.0)
O2 Saturation: 98 %
TCO2: 27 mmol/L (ref 0–100)
pCO2 arterial: 42.6 mmHg (ref 35.0–45.0)
pH, Arterial: 7.39 (ref 7.350–7.450)
pO2, Arterial: 105 mmHg — ABNORMAL HIGH (ref 80.0–100.0)

## 2015-11-14 LAB — BASIC METABOLIC PANEL
Anion gap: 5 (ref 5–15)
BUN: 16 mg/dL (ref 6–20)
CALCIUM: 9.4 mg/dL (ref 8.9–10.3)
CO2: 28 mmol/L (ref 22–32)
CREATININE: 1.26 mg/dL — AB (ref 0.61–1.24)
Chloride: 106 mmol/L (ref 101–111)
GFR, EST NON AFRICAN AMERICAN: 52 mL/min — AB (ref >60)
GLUCOSE: 103 mg/dL — AB (ref 65–99)
Potassium: 4.9 mmol/L (ref 3.5–5.1)
Sodium: 139 mmol/L (ref 135–145)

## 2015-11-14 SURGERY — LEFT HEART CATH AND CORONARY ANGIOGRAPHY

## 2015-11-14 MED ORDER — ACETAMINOPHEN 325 MG PO TABS: 650.0000 mg | ORAL_TABLET | ORAL | Status: DC | PRN

## 2015-11-14 MED ORDER — MIDAZOLAM HCL 2 MG/2ML IJ SOLN
INTRAMUSCULAR | Status: AC
  Filled 2015-11-14: qty 2

## 2015-11-14 MED ORDER — IOPAMIDOL (ISOVUE-370) INJECTION 76%
INTRAVENOUS | Status: AC
  Filled 2015-11-14: qty 100

## 2015-11-14 MED ORDER — SODIUM CHLORIDE 0.9 % IV SOLN: 250.0000 mL | INTRAVENOUS | Status: DC | PRN

## 2015-11-14 MED ORDER — SODIUM CHLORIDE 0.9 % IV SOLN
INTRAVENOUS | Status: DC
  Administered 2015-11-14: 11:00:00 via INTRAVENOUS

## 2015-11-14 MED ORDER — FENTANYL CITRATE (PF) 100 MCG/2ML IJ SOLN
INTRAMUSCULAR | Status: DC | PRN
  Administered 2015-11-14: 50 ug via INTRAVENOUS

## 2015-11-14 MED ORDER — MIDAZOLAM HCL 2 MG/2ML IJ SOLN
INTRAMUSCULAR | Status: DC | PRN
  Administered 2015-11-14 (×2): 1 mg via INTRAVENOUS

## 2015-11-14 MED ORDER — IOPAMIDOL (ISOVUE-370) INJECTION 76%
INTRAVENOUS | Status: DC | PRN
  Administered 2015-11-14: 120 mL via INTRAVENOUS

## 2015-11-14 MED ORDER — SODIUM CHLORIDE 0.9 % WEIGHT BASED INFUSION: 1.0000 mL/kg/h | INTRAVENOUS | Status: DC

## 2015-11-14 MED ORDER — LIDOCAINE HCL (PF) 1 % IJ SOLN
INTRAMUSCULAR | Status: AC
  Filled 2015-11-14: qty 30

## 2015-11-14 MED ORDER — VERAPAMIL HCL 2.5 MG/ML IV SOLN
INTRAVENOUS | Status: DC | PRN
  Administered 2015-11-14: 10 mL via INTRA_ARTERIAL

## 2015-11-14 MED ORDER — HEPARIN SODIUM (PORCINE) 1000 UNIT/ML IJ SOLN
INTRAMUSCULAR | Status: DC | PRN
  Administered 2015-11-14: 6000 [IU] via INTRAVENOUS

## 2015-11-14 MED ORDER — LIDOCAINE HCL (PF) 1 % IJ SOLN
INTRAMUSCULAR | Status: DC | PRN
  Administered 2015-11-14 (×2): 2 mL via INTRADERMAL

## 2015-11-14 MED ORDER — SODIUM CHLORIDE 0.9% FLUSH: 3.0000 mL | Freq: Two times a day (BID) | INTRAVENOUS | Status: DC

## 2015-11-14 MED ORDER — HEPARIN (PORCINE) IN NACL 2-0.9 UNIT/ML-% IJ SOLN
INTRAMUSCULAR | Status: AC
  Filled 2015-11-14: qty 1500

## 2015-11-14 MED ORDER — VERAPAMIL HCL 2.5 MG/ML IV SOLN
INTRAVENOUS | Status: AC
  Filled 2015-11-14: qty 2

## 2015-11-14 MED ORDER — FENTANYL CITRATE (PF) 100 MCG/2ML IJ SOLN
INTRAMUSCULAR | Status: AC
  Filled 2015-11-14: qty 2

## 2015-11-14 MED ORDER — HEPARIN (PORCINE) IN NACL 2-0.9 UNIT/ML-% IJ SOLN
INTRAMUSCULAR | Status: DC | PRN
  Administered 2015-11-14: 1000 mL

## 2015-11-14 MED ORDER — ASPIRIN 81 MG PO CHEW: 81.0000 mg | CHEWABLE_TABLET | ORAL | Status: DC

## 2015-11-14 MED ORDER — SODIUM CHLORIDE 0.9% FLUSH: 3.0000 mL | INTRAVENOUS | Status: DC | PRN

## 2015-11-14 MED ORDER — HEPARIN SODIUM (PORCINE) 1000 UNIT/ML IJ SOLN
INTRAMUSCULAR | Status: AC
  Filled 2015-11-14: qty 1

## 2015-11-14 MED ORDER — ONDANSETRON HCL 4 MG/2ML IJ SOLN: 4.0000 mg | Freq: Four times a day (QID) | INTRAMUSCULAR | Status: DC | PRN

## 2015-11-14 SURGICAL SUPPLY — 15 items
CATH BALLN WEDGE 5F 110CM (CATHETERS) ×1 IMPLANT
CATH EXPO 5FR ST PIGTAIL (CATHETERS) ×1 IMPLANT
CATH INFINITI 5 FR JL3.5 (CATHETERS) ×1 IMPLANT
CATH INFINITI JR4 5F (CATHETERS) ×1 IMPLANT
CATH LAUNCHER 5F EBU3.5 (CATHETERS) ×1 IMPLANT
DEVICE RAD COMP TR BAND LRG (VASCULAR PRODUCTS) ×1 IMPLANT
GLIDESHEATH SLEND A-KIT 6F 22G (SHEATH) ×1 IMPLANT
KIT HEART LEFT (KITS) ×2 IMPLANT
PACK CARDIAC CATHETERIZATION (CUSTOM PROCEDURE TRAY) ×2 IMPLANT
SHEATH FAST CATH BRACH 5F 5CM (SHEATH) ×1 IMPLANT
SYR MEDRAD MARK V 150ML (SYRINGE) ×1 IMPLANT
TRANSDUCER W/STOPCOCK (MISCELLANEOUS) ×2 IMPLANT
TUBING CIL FLEX 10 FLL-RA (TUBING) ×2 IMPLANT
WIRE EMERALD ST .035X260CM (WIRE) ×1 IMPLANT
WIRE SAFE-T 1.5MM-J .035X260CM (WIRE) ×1 IMPLANT

## 2015-11-14 NOTE — Discharge Instructions (Signed)
Radial Site Care °Refer to this sheet in the next few weeks. These instructions provide you with information about caring for yourself after your procedure. Your health care provider may also give you more specific instructions. Your treatment has been planned according to current medical practices, but problems sometimes occur. Call your health care provider if you have any problems or questions after your procedure. °WHAT TO EXPECT AFTER THE PROCEDURE °After your procedure, it is typical to have the following: °· Bruising at the radial site that usually fades within 1-2 weeks. °· Blood collecting in the tissue (hematoma) that may be painful to the touch. It should usually decrease in size and tenderness within 1-2 weeks. °HOME CARE INSTRUCTIONS °· Take medicines only as directed by your health care provider. °· You may shower 24-48 hours after the procedure or as directed by your health care provider. Remove the bandage (dressing) and gently wash the site with plain soap and water. Pat the area dry with a clean towel. Do not rub the site, because this may cause bleeding. °· Do not take baths, swim, or use a hot tub until your health care provider approves. °· Check your insertion site every day for redness, swelling, or drainage. °· Do not apply powder or lotion to the site. °· Do not flex or bend the affected arm for 24 hours or as directed by your health care provider. °· Do not push or pull heavy objects with the affected arm for 24 hours or as directed by your health care provider. °· Do not lift over 10 lb (4.5 kg) for 5 days after your procedure or as directed by your health care provider. °· Ask your health care provider when it is okay to: °¨ Return to work or school. °¨ Resume usual physical activities or sports. °¨ Resume sexual activity. °· Do not drive home if you are discharged the same day as the procedure. Have someone else drive you. °· You may drive 24 hours after the procedure unless otherwise  instructed by your health care provider. °· Do not operate machinery or power tools for 24 hours after the procedure. °· If your procedure was done as an outpatient procedure, which means that you went home the same day as your procedure, a responsible adult should be with you for the first 24 hours after you arrive home. °· Keep all follow-up visits as directed by your health care provider. This is important. °SEEK MEDICAL CARE IF: °· You have a fever. °· You have chills. °· You have increased bleeding from the radial site. Hold pressure on the site. CALL 911 °SEEK IMMEDIATE MEDICAL CARE IF: °· You have unusual pain at the radial site. °· You have redness, warmth, or swelling at the radial site. °· You have drainage (other than a small amount of blood on the dressing) from the radial site. °· The radial site is bleeding, and the bleeding does not stop after 30 minutes of holding steady pressure on the site. °· Your arm or hand becomes pale, cool, tingly, or numb. °  °This information is not intended to replace advice given to you by your health care provider. Make sure you discuss any questions you have with your health care provider. °  °Document Released: 04/18/2010 Document Revised: 04/06/2014 Document Reviewed: 10/02/2013 °Elsevier Interactive Patient Education ©2016 [employer] ° °

## 2015-11-14 NOTE — Interval H&P Note (Signed)
Cath Lab Visit (complete for each Cath Lab visit)  Clinical Evaluation Leading to the Procedure:   ACS: No.  Non-ACS:    Anginal Classification: CCS Horn  Anti-ischemic medical therapy: Minimal Therapy (1 class of medications)  Non-Invasive Test Results: No non-invasive testing performed  Prior CABG: No previous CABG      History and Physical Interval Note:  11/14/2015 12:25 PM  Robert Horn  has presented today for surgery, with the diagnosis of hf  The various methods of treatment have been discussed with the patient and family. After consideration of risks, benefits and other options for treatment, the patient has consented to  Procedure(s): Left Heart Cath and Coronary Angiography (N/A) as a surgical intervention .  The patient's history has been reviewed, patient examined, no change in status, stable for surgery.  I have reviewed the patient's chart and labs.  Questions were answered to the patient's satisfaction.     Robert Horn

## 2015-11-14 NOTE — H&P (View-Only) (Signed)
Cardiology Office Note    Date:  10/29/2015   ID:  [name], DOB 1934/03/02, MRN [id number]  PCP:  Leola Brazil, MD  Cardiologist: Sinclair Grooms, MD   Chief Complaint  Patient presents with  . Congestive Heart Failure    History of Present Illness:  Robert Horn is a 82 y.o. male follow-up of acute CHF episode occurring 72 hours ago. Has history of chronic combined systolic and diastolic heart failure, hypertension, erectile dysfunction, mild calcific aortic stenosis and hyperlipidemia.  Robert Horn is doing better now. He presented early Saturday morning into the [facility] emergency room extremely short of breath. On chest x-ray he had mild pulmonary congestion. BNP was 840. He was given IV Lasix and had a dramatic diuresis and felt great. He states that following the hospitalization his breathing was better than admitted been in years. Now 2 days following discharge he is a little more short of breath and he was when he left the hospital. He denies chest pain either at the time of presentation or previously.  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 He had not taken his antihypertensive therapy for up to a week prior to admission. He is now back on his medication.   Past Medical History:  Diagnosis Date  . Dyslipidemia 10/27/2015  . Elevated PSA 10/27/2015  . Erectile dysfunction 10/27/2015  . Hypertension 10/27/2015  . Hypogonadism male 10/27/2015  . Obesity 10/27/2015  . Pneumonia 10/27/2015  . Rotator cuff tear 10/27/2015    No past surgical history on file.  Current Medications: Outpatient Medications Prior to Visit  Medication Sig Dispense Refill  . amLODipine (NORVASC) 10 MG tablet TAKE 1 TABLET BY MOUTH  DAILY. PLEASE KEEP  APPOINTMENT FOR FURTHER  REFILLS TO BE GRANTED 40 tablet 0  . aspirin (ASPIRIN EC) 81 MG EC tablet Take 81 mg by mouth daily. Swallow whole.    . docusate  sodium (COLACE) 100 MG capsule Take 1 capsule (100 mg total) by mouth 2 (two) times daily. 60 capsule 0  . hydrALAZINE (APRESOLINE) 25 MG tablet Take 1 tablet (25 mg total) by mouth every 8 (eight) hours. 90 tablet 1  . metoprolol succinate (TOPROL-XL) 25 MG 24 hr tablet TAKE 1 TABLET BY MOUTH  DAILY. PLEASE KEEP  APPOINTMENT FOR FURTHER  REFILLS TO BE GRANTED 40 tablet 0  . Multiple Vitamin (MULTIVITAMIN WITH MINERALS) TABS tablet Take 1 tablet by mouth daily. 30 tablet 1  . omega-3 acid ethyl esters (LOVAZA) 1 g capsule Take 1 capsule (1 g total) by mouth daily. 30 capsule 1  . polyethylene glycol (MIRALAX / GLYCOLAX) packet Take 17 g by mouth daily. 30 each 0  . VITAMIN B COMPLEX-C PO Take 1 tablet by mouth daily.    Marland Kitchen olmesartan-hydrochlorothiazide (BENICAR HCT) 40-12.5 MG tablet TAKE 1 TABLET BY MOUTH  DAILY. PLEASE KEEP  APPOINTMENT FOR FURTHER  REFILLS TO BE GRANTED 40 tablet 0   No facility-administered medications prior to visit.      Allergies:   Review of patient's allergies indicates no known allergies.   Social History   Social History  . Marital status: Single    Spouse name: N/A  . Number of children: N/A  . Years of education: N/A   Social History Main Topics  . Smoking status: Former Smoker    Types: Cigarettes  . Smokeless tobacco: Never Used  . Alcohol use No  .  Drug use: No  . Sexual activity: Not Asked   Other Topics Concern  . None   Social History Narrative  . None     Family History:  The patient's family history includes Diabetes in his mother; Heart disease in his mother; Pulmonary embolism in his father.   ROS:   Please see the history of present illness.    Each salt heavily in his diet. Volume intake is been excessive.  All other systems reviewed and are negative.   PHYSICAL EXAM:   VS:  BP (!) 142/86   Pulse 69   Ht 6' (1.829 m)   Wt 241 lb 12.8 oz (109.7 kg)   BMI 32.79 kg/m    GEN: Well nourished, well developed, in no acute distress   HEENT: normal  Neck: no JVD, carotid bruits, or masses Cardiac: RRR, rubs, or gallops,no edema . Mildly tachycardic. An S4 gallop is audible. Systolic murmur is heard. No diastolic murmurs heard. Respiratory:  clear to auscultation bilaterally, normal work of breathing GI: soft, nontender, nondistended, + BS MS: no deformity or atrophy  Skin: warm and dry, no rash Neuro:  Alert and Oriented x 3, Strength and sensation are intact Psych: euthymic mood, full affect  Wt Readings from Last 3 Encounters:  10/29/15 241 lb 12.8 oz (109.7 kg)  10/27/15 237 lb (107.5 kg)  05/09/13 250 lb (113.4 kg)      Studies/Labs Reviewed:   EKG:  EKG  Normal sinus rhythm, rightward axis, incomplete right bundle branch block, Paul wave progression V1 through V5 compatible with anterior infarction of undetermined age  Recent Labs: 10/26/2015: ALT 22; B Natriuretic Peptide 839.1; Hemoglobin 13.3; Platelets PLATELET CLUMPS NOTED ON SMEAR, COUNT APPEARS ADEQUATE 10/27/2015: BUN 23; Creatinine, Ser 1.25; Potassium 3.9; Sodium 140   Lipid Panel No results found for: CHOL, TRIG, HDL, CHOLHDL, VLDL, LDLCALC, LDLDIRECT  Additional studies/ records that were reviewed today include:  Echocardiogram, 10/27/2015 Study Conclusions  - Left ventricle: The cavity size was mildly dilated. Systolic   function was mildly to moderately reduced. The estimated ejection   fraction was in the range of 40% to 45%. Diffuse hypokinesis.   Features are consistent with a pseudonormal left ventricular   filling pattern, with concomitant abnormal relaxation and   increased filling pressure (grade 2 diastolic dysfunction). - Aortic valve: Valve mobility was restricted. There was moderate   stenosis. There was mild regurgitation. Peak velocity (S): 343   cm/s. Mean gradient (S): 30 mm Hg. Valve area (VTI): 0.96 cm^2.   Valve area (Vmax): 1.03 cm^2. Valve area (Vmean): 0.92 cm^2. - Mitral valve: There was mild regurgitation. - Left  atrium: The atrium was severely dilated. - Right ventricle: The cavity size was normal. Wall thickness was   normal. Systolic function was normal. - Tricuspid valve: There was trivial regurgitation. - Inferior vena cava: The vessel was normal in size. The   respirophasic diameter changes were in the normal range (>= 50%),   consistent with normal central venous pressure.   ASSESSMENT:    1. Chronic diastolic heart failure ([place])   2. Aortic stenosis   3. Essential hypertension   4. CKD (chronic kidney disease) stage 2, GFR 60-89 ml/min   5. PVC's (premature ventricular contractions)   6. Hyperlipidemia      PLAN:  In order of problems listed above:  1. No evidence of volume overload but all clinical data suggests an episode of heart failure. This is probably mostly precipitated by noncompliance with medical  regimen and dietary indiscretion. I cannot exclude the possibility of coronary artery disease especially given abnormalities noted on the patient's EKG. He did rule out for myocardial infarction. Echo also demonstrated that LV function has decreased into the 45% range. He now has chronic combined systolic and diastolic heart failure. I have recommended left and right heart catheterization with coronary angiography to fully investigate this episode of congestive heart failure. I will discontinue hydrochlorothiazide and start furosemide 40 mg daily. He will need to have a basic metabolic panel 1 week after starting furosemide. Cardiac catheterization will be done in 2-3 weeks. 2. Based upon the echo, aortic valve disease is mild to moderate and unchanged when compared to prior. 3. Pressure is much better controlled today. The adjustment in medical regimen we'll probably allow [place] to discontinue hydralazine. The new regimen will be beta blocker therapy (which should probably be switched to carvedilol), Benicar, furosemide 40 mg daily, and amlodipine. Unless absolutely necessary, we will try to  do without hydralazine. 4. I noticed after diuresis that there was mild impairment in kidney numbers. After being placed on chronic Lasix therapy for about a week. We will recheck a basic metabolic panel.  The patient was counseled to undergo left heart catheterization, coronary angiography, and possible percutaneous coronary intervention with stent implantation. The procedural risks and benefits were discussed in detail. The risks discussed included death, stroke, myocardial infarction, life-threatening bleeding, limb ischemia, kidney injury, allergy, and possible emergency cardiac surgery. The risk of these significant complications were estimated to occur less than 1% of the time. After discussion, the patient has agreed to proceed.  Medication Adjustments/Labs and Tests Ordered: Current medicines are reviewed at length with the patient today.  Concerns regarding medicines are outlined above.  Medication changes, Labs and Tests ordered today are listed in the Patient Instructions below. Patient Instructions  Medication Instructions:  1) DISCONTINUE Benicar/HCT 2) START Benicar 40mg  once daily 3) START Furosemide 40mg  once daily  Labwork: Your physician recommends that you return for lab work on Monday 8/7.   Testing/Procedures: Your physician has requested that you have a cardiac catheterization. Cardiac catheterization is used to diagnose and/or treat various heart conditions. Doctors may recommend this procedure for a number of different reasons. The most common reason is to evaluate chest pain. Chest pain can be a symptom of coronary artery disease (CAD), and cardiac catheterization can show whether plaque is narrowing or blocking your heart's arteries. This procedure is also used to evaluate the valves, as well as measure the blood flow and oxygen levels in different parts of your heart. For further information please visit [email or web address]. Please follow instruction sheet, as  given.  Available dates for Dr. [name] are 8/16, 8/17, 8/21.  Please call our office and let us know which of these dates work best for you.  Follow-Up: Will be determined after your catheterization.  Any Other Special Instructions Will Be Listed Below (If Applicable).     If you need a refill on your cardiac medications before your next appointment, please call your pharmacy.      Signed, Sinclair Grooms, MD  10/29/2015 12:18 PM    [facility] [place], [place], [place]  [address] Phone: [phone]; Fax: [phone]

## 2015-11-14 NOTE — Research (Signed)
[place] Study Informed Consent   Subject Name: Robert Horn  Subject met inclusion and exclusion criteria.  The informed consent form, study requirements and expectations were reviewed with the subject and questions and concerns were addressed prior to the signing of the consent form.  The subject verbalized understanding of the trial requirements.  The subject agreed to participate in the trial and signed the informed consent at 1200 on 11/14/2015.  The informed consent was obtained prior to performance of any protocol-specific procedures for the subject.  A copy of the signed informed consent was given to the subject and a copy was placed in the subject's medical record.  Blossom Hoops 11/14/2015, 3:50 PM

## 2015-11-14 NOTE — Progress Notes (Signed)
Site area: Rt brachial Site Prior to Removal:  Level 0 Pressure Applied For: 10 min Manual:   yes Patient Status During Pull:  A/O Post Pull Site:  Level 0 Post Pull Instructions Given:  Yes, pt understands post instructions Post Pull Pulses Present:  Dressing Applied: 2x2 and a tegaderm Bedrest begins @ 14:10:00 Comments: Pt leaves ha in stable condition. Rt radial and rt brachial sites are unremarkable.

## 2015-11-15 ENCOUNTER — Encounter (HOSPITAL_COMMUNITY): Payer: Self-pay | Admitting: Interventional Cardiology

## 2015-11-15 ENCOUNTER — Telehealth: Payer: Self-pay | Admitting: *Deleted

## 2015-11-15 MED ORDER — AMLODIPINE BESYLATE 10 MG PO TABS: 10.0000 mg | ORAL_TABLET | Freq: Every day | ORAL | 11 refills | Status: DC

## 2015-11-15 NOTE — Telephone Encounter (Signed)
Wife was called, pt to be on all meds on list, metoprolol had already been refilled, refill done for amlodipine. Told her to check bp 2 x a day since pt is now getting back on all of his meds. Call with questions or concerns. She verbalized understanding.

## 2015-11-15 NOTE — Telephone Encounter (Signed)
Patients wife, Danton Clap left a msg on the refill vm questioning if the patient should continue the amlodipine and metoprolol. She stated that they were informed upon hospital discharge yesterday that these were not listed as current therapy for the patient. She can be reached at [phone]. Thanks, MI

## 2015-11-19 ENCOUNTER — Ambulatory Visit: Payer: Medicare Other | Admitting: Interventional Cardiology

## 2015-11-19 ENCOUNTER — Telehealth: Payer: Self-pay | Admitting: Interventional Cardiology

## 2015-11-19 NOTE — Telephone Encounter (Signed)
Pt had a cardiac cardiac last Thursday 8/17. Pt said that when he went to court this week, he had to climbed  several steps, he was somehow out of breath. Pt would like to know if he can get two  handicap stickers for a few months. His BP is 120/68. Pt also said that he  didn't know that his appointment for today was canceled by Lattie Haw. Pt's appointment was at 3:45 PM today,and does not have a F/U. Pt is aware that Dr [name] wants to see him 4 to 6 weeks after the cardiac cath. Lattie Haw will call him with appointment. Pt is aware that this message to MD to see if   Okay the  handicap stickers.

## 2015-11-19 NOTE — Telephone Encounter (Signed)
Pt and a family member are aware that pt can come to the office to peak up the Application for Disability Parking  At the front desk. Pt verbalized understanding.

## 2015-11-19 NOTE — Telephone Encounter (Signed)
New Message:    Pt had Cath on Thursday,he was unaware that appointment for today was cancelled.Please call,pt have some problems he needs to discuss.

## 2015-11-19 NOTE — Telephone Encounter (Signed)
Yes on the handicap stickers. We'll keep them in office. He will need to drop it off and I will signed them. He does need an office appointment within the next several weeks.

## 2015-11-20 ENCOUNTER — Telehealth: Payer: Self-pay

## 2015-11-20 NOTE — Telephone Encounter (Signed)
-----   Message from [name], MD sent at 11/16/2015 12:13 PM EDT ----- Regarding: Valora Piccolo meds Please determine exactly what Mr. Trejos is taking and how. Apologize for my continued badgering, but I want to be clear.

## 2015-11-20 NOTE — Telephone Encounter (Signed)
-----   Message from [name], MD sent at 11/16/2015 12:13 PM EDT ----- Regarding: Robert Horn meds Please determine exactly what Mr. Beiler is taking and how. Apologize for my continued badgering, but I want to be clear.

## 2015-11-20 NOTE — Telephone Encounter (Signed)
Called pt after receiving a message from Dr.Smith to call the pt to verify his Metoprolol Succ dosage. Spoke with the patients wife [name], she sts that the pt is taking Metoprolol succ 25mg  daily. Adv her that I will fwd the update to Dr.Smith and call back if he is ging to make any changes.

## 2015-11-24 NOTE — Telephone Encounter (Signed)
Increase metoprolol succ to 25 mg BID

## 2015-11-25 MED ORDER — METOPROLOL SUCCINATE ER 25 MG PO TB24: 25.0000 mg | ORAL_TABLET | Freq: Two times a day (BID) | ORAL | 11 refills | Status: DC

## 2015-11-25 NOTE — Telephone Encounter (Signed)
Pt wife aware of Dr.Smith's recommendation to increase Metoprolol to 25mg  bid. Rx sent to pt pharmacy. F/u appt with Dr.Smith scheduled on 12/17/15 @ 8am. Pt wife verbalized understanding.

## 2015-11-28 ENCOUNTER — Other Ambulatory Visit: Payer: Self-pay | Admitting: Interventional Cardiology

## 2015-12-17 ENCOUNTER — Ambulatory Visit (INDEPENDENT_AMBULATORY_CARE_PROVIDER_SITE_OTHER): Payer: Medicare Other | Admitting: Interventional Cardiology

## 2015-12-17 ENCOUNTER — Encounter (INDEPENDENT_AMBULATORY_CARE_PROVIDER_SITE_OTHER): Payer: Self-pay

## 2015-12-17 ENCOUNTER — Encounter: Payer: Self-pay | Admitting: Interventional Cardiology

## 2015-12-17 VITALS — BP 136/70 | HR 63 | Ht 72.0 in | Wt 228.0 lb

## 2015-12-17 DIAGNOSIS — E785 Hyperlipidemia, unspecified: Secondary | ICD-10-CM

## 2015-12-17 DIAGNOSIS — I110 Hypertensive heart disease with heart failure: Secondary | ICD-10-CM

## 2015-12-17 DIAGNOSIS — I5042 Chronic combined systolic (congestive) and diastolic (congestive) heart failure: Secondary | ICD-10-CM | POA: Diagnosis not present

## 2015-12-17 DIAGNOSIS — I350 Nonrheumatic aortic (valve) stenosis: Secondary | ICD-10-CM | POA: Diagnosis not present

## 2015-12-17 DIAGNOSIS — N182 Chronic kidney disease, stage 2 (mild): Secondary | ICD-10-CM

## 2015-12-17 DIAGNOSIS — I10 Essential (primary) hypertension: Secondary | ICD-10-CM | POA: Diagnosis not present

## 2015-12-17 LAB — BASIC METABOLIC PANEL
BUN: 22 mg/dL (ref 7–25)
CO2: 25 mmol/L (ref 20–31)
Calcium: 9.5 mg/dL (ref 8.6–10.3)
Chloride: 104 mmol/L (ref 98–110)
Creat: 1.25 mg/dL — ABNORMAL HIGH (ref 0.70–1.11)
Glucose, Bld: 119 mg/dL — ABNORMAL HIGH (ref 65–99)
POTASSIUM: 4.1 mmol/L (ref 3.5–5.3)
Sodium: 139 mmol/L (ref 135–146)

## 2015-12-17 NOTE — Patient Instructions (Signed)
Medication Instructions:  None  Labwork: BMET today  Testing/Procedures: None  Follow-Up: Your physician wants you to follow-up in: 6 months with Dr. [name]. You will receive a reminder letter in the mail two months in advance. If you don't receive a letter, please call our office to schedule the follow-up appointment.   Any Other Special Instructions Will Be Listed Below (If Applicable).     If you need a refill on your cardiac medications before your next appointment, please call your pharmacy.

## 2015-12-17 NOTE — Progress Notes (Signed)
Cardiology Office Note    Date:  12/17/2015   ID:  Robert Horn, DOB 08/24/1933, MRN [id number]  PCP:  Kandice Hams, MD  Cardiologist: Sinclair Grooms, MD   No chief complaint on file.   History of Present Illness:  Robert Horn is a 82 y.o. male for follow up of Moderate aortic stenosis, essential hypertension, chronic combined systolic heart failure, conduction abnormality including first-degree AV block (limiting titration of beta blocker therapy) and recent hospitalization for CHF when he was off medical therapy.  What is doing well. He is taking his medications as listed. He brought me a copy of his blood pressures all of which have been running between 110/60 to 136/80 mmHg. The great majority are in the 1 20 mmHg range. He is asymptomatic. When we increase metoprolol to 50 mg per day he developed hypotension and became dizzy.  Overall, he is doing well and has no complaints.  Past Medical History:  Diagnosis Date  . Dyslipidemia 10/27/2015  . Elevated PSA 10/27/2015  . Erectile dysfunction 10/27/2015  . Hypertension 10/27/2015  . Hypogonadism male 10/27/2015  . Obesity 10/27/2015  . Pneumonia 10/27/2015  . Rotator cuff tear 10/27/2015    Past Surgical History:  Procedure Laterality Date  . CARDIAC CATHETERIZATION N/A 11/14/2015   Procedure: Left Heart Cath and Coronary Angiography;  Surgeon: [name], MD;  Location: [facility];  Service: Cardiovascular;  Laterality: N/A;    Current Medications: Outpatient Medications Prior to Visit  Medication Sig Dispense Refill  . amLODipine (NORVASC) 10 MG tablet Take 1 tablet (10 mg total) by mouth daily. 30 tablet 11  . aspirin (ASPIRIN EC) 81 MG EC tablet Take 81 mg by mouth daily. Swallow whole.    . docusate sodium (COLACE) 100 MG capsule Take 1 capsule (100 mg total) by mouth 2 (two) times daily. 60 capsule 0  . furosemide (LASIX) 40 MG tablet TAKE 1 TABLET (40 MG TOTAL) BY MOUTH DAILY. 30 tablet 1  .  hydrALAZINE (APRESOLINE) 25 MG tablet Take 1 tablet (25 mg total) by mouth every 8 (eight) hours. 90 tablet 1  . metoprolol succinate (TOPROL-XL) 25 MG 24 hr tablet Take 1 tablet (25 mg total) by mouth 2 (two) times daily. 60 tablet 11  . Multiple Vitamin (MULTIVITAMIN WITH MINERALS) TABS tablet Take 1 tablet by mouth daily. 30 tablet 1  . olmesartan (BENICAR) 40 MG tablet TAKE 1 TABLET (40 MG TOTAL) BY MOUTH DAILY. 30 tablet 1  . omega-3 acid ethyl esters (LOVAZA) 1 g capsule Take 1 capsule (1 g total) by mouth daily. 30 capsule 1  . VITAMIN B COMPLEX-C PO Take 1 tablet by mouth daily.     No facility-administered medications prior to visit.      Allergies:   Review of patient's allergies indicates no known allergies.   Social History   Social History  . Marital status: Single    Spouse name: N/A  . Number of children: N/A  . Years of education: N/A   Social History Main Topics  . Smoking status: Former Smoker    Types: Cigarettes  . Smokeless tobacco: Never Used  . Alcohol use No  . Drug use: No  . Sexual activity: Not Asked   Other Topics Concern  . None   Social History Narrative  . None     Family History:  The patient's family history includes Diabetes in his mother; Heart disease in his mother; Pulmonary embolism in his father.  ROS:   Please see the history of present illness.    No complaints  All other systems reviewed and are negative.   PHYSICAL EXAM:   VS:  BP 136/70   Pulse 63   Ht 6' (1.829 m)   Wt 228 lb (103.4 kg)   SpO2 98%   BMI 30.92 kg/m    GEN: Well nourished, well developed, in no acute distress  HEENT: normal  Neck: no JVD, carotid bruits, or masses Cardiac: RRR; 3/6 crescendo decrescendo systolic murmur of aortic stenosis. 2/6 diastolic aortic regurgitation murmur. No rubs, or gallops,no edema . Respiratory:  clear to auscultation bilaterally, normal work of breathing. GI: soft, nontender, nondistended, + BS MS: no deformity or  atrophy  Skin: warm and dry, no rash Neuro:  Alert and Oriented x 3, Strength and sensation are intact Psych: euthymic mood, full affect  Wt Readings from Last 3 Encounters:  12/17/15 228 lb (103.4 kg)  11/14/15 235 lb (106.6 kg)  10/29/15 241 lb 12.8 oz (109.7 kg)      Studies/Labs Reviewed:   EKG:  EKG  First-degree AV block, incomplete right bundle branch block, contrast pattern V1 through the 4 unchanged from prior tracings.  Recent Labs: 10/26/2015: ALT 22; B Natriuretic Peptide 839.1 11/11/2015: Hemoglobin 12.7; Platelets 146 11/14/2015: BUN 16; Creatinine, Ser 1.26; Potassium 4.9; Sodium 139   Lipid Panel No results found for: CHOL, TRIG, HDL, CHOLHDL, VLDL, LDLCALC, LDLDIRECT  Additional studies/ records that were reviewed today include:  Reviewed cardiac catheterization report 11/14/15: Conclusion     There is moderate to severe left ventricular systolic dysfunction.  LV end diastolic pressure is moderately elevated.  The left ventricular ejection fraction is 35-45% by visual estimate.  There is moderate aortic valve stenosis. There is trivial (1+) aortic regurgitation.    Calcific aortic valve disease with mild aortic regurgitation and mild to moderate aortic stenosis.  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  Widely patent normal appearing coronary arteries.  Estimated left ventricular ejection fraction is 30-35% with elevated end-diastolic pressure and global hypokinesis. Findings are compatible with nonischemic cardiomyopathy  Mild pulmonary hypertension  RECOMMENDATIONS:   Optimize medical therapy      ASSESSMENT:    1. Chronic combined systolic and diastolic heart failure ([place])   2. Essential hypertension   3. Aortic stenosis   4. Hypertensive heart disease with heart failure ([place])   5. CKD (chronic kidney disease) stage 2, GFR 60-89 ml/min   6. Hyperlipidemia      PLAN:  In order of problems listed  above:  1. No clinical evidence of CHF. 2. Excellent blood pressure control based upon review of data. No change in therapy but we will consider decreasing the dose of hydralazine to twice a day in the future. For the time being we will leave all therapy as noted. 3. Aortic stenosis is moderate. This is in the setting of decreased LV function with EF in the 45-50% range. For the time being we have chosen to optimize medical therapy. I did discuss with Mr. Aris Lot B the possibility of future TAVR. For the time being we will follow clinically. Echo will be done next summer. 4. Blood pressure is well-controlled. 5. Basic metabolic panel today.    Medication Adjustments/Labs and Tests Ordered: Current medicines are reviewed at length with the patient today.  Concerns regarding medicines are outlined above.  Medication changes, Labs and Tests ordered today are listed in the Patient Instructions below.  Patient Instructions  Medication Instructions:  None  Labwork: BMET today  Testing/Procedures: None  Follow-Up: Your physician wants you to follow-up in: 6 months with Dr. [name]. You will receive a reminder letter in the mail two months in advance. If you don't receive a letter, please call our office to schedule the follow-up appointment.   Any Other Special Instructions Will Be Listed Below (If Applicable).     If you need a refill on your cardiac medications before your next appointment, please call your pharmacy.      Signed, Sinclair Grooms, MD  12/17/2015 8:24 AM    [facility] [place], [place], [place]  [address] Phone: [phone]; Fax: [phone]

## 2015-12-19 ENCOUNTER — Other Ambulatory Visit: Payer: Self-pay | Admitting: Interventional Cardiology

## 2015-12-19 NOTE — Telephone Encounter (Signed)
I do not see where Dr [name] has ever filled this for the patient. Okay to refill? Please advise. Thanks, MI

## 2015-12-23 ENCOUNTER — Other Ambulatory Visit: Payer: Self-pay | Admitting: Interventional Cardiology

## 2015-12-24 NOTE — Telephone Encounter (Signed)
Dr [name] has never filled this for the patient. Okay to refill? Please advise. Thanks, MI

## 2016-01-05 ENCOUNTER — Other Ambulatory Visit: Payer: Self-pay | Admitting: Interventional Cardiology

## 2016-01-06 NOTE — Telephone Encounter (Signed)
amLODipine (NORVASC) 10 MG tablet  Medication  Date: 11/15/2015 Department: [facility] Office Ordering/Authorizing: [name], MD  Order Providers   Prescribing Provider Encounter Provider  [name], MD Juventino Slovak, CMA  Medication Detail    Disp Refills Start End   amLODipine (NORVASC) 10 MG tablet 30 tablet 11 11/15/2015    Sig - Route: Take 1 tablet (10 mg total) by mouth daily. - Oral   E-Prescribing Status: Receipt confirmed by pharmacy (11/15/2015 1:25 PM EDT)   Pharmacy   [id number] - [place], [place] [address]

## 2016-02-13 DIAGNOSIS — N401 Enlarged prostate with lower urinary tract symptoms: Secondary | ICD-10-CM | POA: Diagnosis not present

## 2016-02-13 DIAGNOSIS — R202 Paresthesia of skin: Secondary | ICD-10-CM | POA: Diagnosis not present

## 2016-02-13 DIAGNOSIS — I350 Nonrheumatic aortic (valve) stenosis: Secondary | ICD-10-CM | POA: Diagnosis not present

## 2016-02-13 DIAGNOSIS — I10 Essential (primary) hypertension: Secondary | ICD-10-CM | POA: Diagnosis not present

## 2016-02-13 DIAGNOSIS — I5040 Unspecified combined systolic (congestive) and diastolic (congestive) heart failure: Secondary | ICD-10-CM | POA: Diagnosis not present

## 2016-03-28 DIAGNOSIS — Z23 Encounter for immunization: Secondary | ICD-10-CM | POA: Diagnosis not present

## 2016-06-09 ENCOUNTER — Encounter: Payer: Self-pay | Admitting: Interventional Cardiology

## 2016-06-14 NOTE — Progress Notes (Signed)
Cardiology Office Note    Date:  06/15/2016   ID:  Robert Horn, DOB 15-May-1933, MRN [id number]  PCP:  Kandice Hams, MD  Cardiologist: Sinclair Grooms, MD   Chief Complaint  Patient presents with  . Congestive Heart Failure    History of Present Illness:  Robert Horn is a 83 y.o. male for follow up of Moderate aortic stenosis, essential hypertension, chronic combined systolic heart failure, conduction abnormality including first-degree AV block (limiting titration of beta blocker therapy) and recent hospitalization for CHF when he was off medical therapy.  Robert Horn still has occasional wheezing. He feels more short of breath and he feels he should with physical activity such as playing golf. He denies orthopnea. No peripheral edema is noted. He is taking metoprolol succinate 25 mg per day but our records suggested we try to increase the dose to 25 mg twice a day. He has no chest discomfort. He has not had syncope.   Past Medical History:  Diagnosis Date  . Dyslipidemia 10/27/2015  . Elevated PSA 10/27/2015  . Erectile dysfunction 10/27/2015  . Hypertension 10/27/2015  . Hypogonadism male 10/27/2015  . Obesity 10/27/2015  . Pneumonia 10/27/2015  . Rotator cuff tear 10/27/2015    Past Surgical History:  Procedure Laterality Date  . CARDIAC CATHETERIZATION Robert Horn 11/14/2015   Procedure: Left Heart Cath and Coronary Angiography;  Surgeon: [name], MD;  Location: [facility];  Service: Cardiovascular;  Laterality: Robert Horn;    Current Medications: Outpatient Medications Prior to Visit  Medication Sig Dispense Refill  . amLODipine (NORVASC) 10 MG tablet Take 1 tablet (10 mg total) by mouth daily. 30 tablet 11  . aspirin (ASPIRIN EC) 81 MG EC tablet Take 81 mg by mouth daily. Swallow whole.    . docusate sodium (COLACE) 100 MG capsule Take 1 capsule (100 mg total) by mouth 2 (two) times daily. 60 capsule 0  . furosemide (LASIX) 40 MG tablet TAKE 1 TABLET (40 MG TOTAL)  BY MOUTH DAILY. 30 tablet 1  . hydrALAZINE (APRESOLINE) 25 MG tablet TAKE 1 TABLET BY MOUTH EVERY 8 HOURS 90 tablet 11  . Multiple Vitamin (MULTIVITAMIN WITH MINERALS) TABS tablet Take 1 tablet by mouth daily. 30 tablet 1  . olmesartan (BENICAR) 40 MG tablet TAKE 1 TABLET (40 MG TOTAL) BY MOUTH DAILY. 30 tablet 1  . omega-3 acid ethyl esters (LOVAZA) 1 g capsule TAKE ONE CAPSULE BY MOUTH EVERY DAY 30 capsule 11  . VITAMIN B COMPLEX-C PO Take 1 tablet by mouth daily.    . metoprolol succinate (TOPROL-XL) 25 MG 24 hr tablet Take 1 tablet (25 mg total) by mouth 2 (two) times daily. (Patient taking differently: Take 25 mg by mouth daily. ) 60 tablet 11   No facility-administered medications prior to visit.      Allergies:   Patient has no known allergies.   Social History   Social History  . Marital status: Single    Spouse name: Robert Horn  . Number of children: Robert Horn  . Years of education: Robert Horn   Social History Main Topics  . Smoking status: Former Smoker    Types: Cigarettes  . Smokeless tobacco: Never Used  . Alcohol use No  . Drug use: No  . Sexual activity: Not Asked   Other Topics Concern  . None   Social History Narrative  . None     Family History:  The patient's family history includes Diabetes in his mother; Heart disease in his  mother; Pulmonary embolism in his father.   ROS:   Please see the history of present illness.    Patient wheezing and cough. Otherwise no complaints.  All other systems reviewed and are negative.   PHYSICAL EXAM:   VS:  BP 136/62   Pulse 60   Ht 6' (1.829 m)   Wt 230 lb (104.3 kg)   SpO2 96%   BMI 31.19 kg/m    GEN: Well nourished, well developed, in no acute distress  HEENT: normal  Neck: no JVD, carotid bruits, or masses Cardiac: RRR; 2-3 / 6 crescendo decrescendo systolic murmur of aortic stenosis. No significant aortic regurgitation is heard. No rub or gallops. No lower extremity  edema . Respiratory:  clear to auscultation  bilaterally, normal work of breathing GI: soft, nontender, nondistended, + BS MS: no deformity or atrophy  Skin: warm and dry, no rash Neuro:  Alert and Oriented x 3, Strength and sensation are intact Psych: euthymic mood, full affect  Wt Readings from Last 3 Encounters:  06/15/16 230 lb (104.3 kg)  12/17/15 228 lb (103.4 kg)  11/14/15 235 lb (106.6 kg)      Studies/Labs Reviewed:   EKG:  EKG  Not performed  Recent Labs: 10/26/2015: ALT 22; B Natriuretic Peptide 839.1 11/11/2015: Hemoglobin 12.7; Platelets 146 12/17/2015: BUN 22; Creat 1.25; Potassium 4.1; Sodium 139   Lipid Panel No results found for: CHOL, TRIG, HDL, CHOLHDL, VLDL, LDLCALC, LDLDIRECT  Additional studies/ records that were reviewed today include:   Cardiac catheterization August 2017:  Conclusion     There is moderate to severe left ventricular systolic dysfunction.  LV end diastolic pressure is moderately elevated.  The left ventricular ejection fraction is 35-45% by visual estimate.  There is moderate aortic valve stenosis. There is trivial (1+) aortic regurgitation.    Calcific aortic valve disease with mild aortic regurgitation and mild to moderate aortic stenosis.  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  Widely patent normal appearing coronary arteries.  Estimated left ventricular ejection fraction is 30-35% with elevated end-diastolic pressure and global hypokinesis. Findings are compatible with nonischemic cardiomyopathy  Mild pulmonary hypertension   RECOMMENDATIONS:    Optimize medical therapy     ECHOCARDIOGRAM, July 2017: Study Conclusions  - Left ventricle: The cavity size was mildly dilated. Systolic   function was mildly to moderately reduced. The estimated ejection   fraction was in the range of 40% to 45%. Diffuse hypokinesis.   Features are consistent with a pseudonormal left ventricular   filling pattern, with concomitant abnormal  relaxation and   increased filling pressure (grade 2 diastolic dysfunction). - Aortic valve: Valve mobility was restricted. There was moderate   stenosis. There was mild regurgitation. Peak velocity (S): 343   cm/s. Mean gradient (S): 30 mm Hg. Valve area (VTI): 0.96 cm^2.   Valve area (Vmax): 1.03 cm^2. Valve area (Vmean): 0.92 cm^2. - Mitral valve: There was mild regurgitation. - Left atrium: The atrium was severely dilated. - Right ventricle: The cavity size was normal. Wall thickness was   normal. Systolic function was normal. - Tricuspid valve: There was trivial regurgitation. - Inferior vena cava: The vessel was normal in size. The   respirophasic diameter changes were in the normal range (>= 50%),   consistent with normal central venous pressure.   ASSESSMENT:    1. Nonrheumatic aortic valve stenosis   2. Chronic combined systolic and diastolic heart failure ([place])   3. Essential hypertension  4. Hypertensive heart disease with heart failure ([place])   5. PVC's (premature ventricular contractions)   6. Other hyperlipidemia      PLAN:  In order of problems listed above:  1. Moderate aortic stenosis based upon prior echo and clinical exam. We will repeat his echo this summer to rule out progression and also reassess LV function. 2. Will make adjustments in current medical regimen by switching from metoprolol succinate to carvedilol 6.25 mg by mouth twice a day. Because there is still probably some mild volume excess, will at Aldactone 12.5 mg daily. Basic metabolic panel will be done in 7-10 days. Clinical follow-up in 4-6 weeks. We'll hope to further titrate heart failure therapy over the next 2 months. Care with beta blocker titration because of first-degree AV block. 3. Blood pressure is under excellent control. 4. Does not have coronary disease and it is likely that LV dysfunction is related to poorly controlled hypertension. 5. Premature beats a much less prominent on today's  exam.  Adjust medical regimen to improve LV function. Slow titration is necessary to avoid syncope related to aortic stenosis. LV reassessment in June or July 2018. Follow-up in 4-6 weeks for further medication titration.    Medication Adjustments/Labs and Tests Ordered: Current medicines are reviewed at length with the patient today.  Concerns regarding medicines are outlined above.  Medication changes, Labs and Tests ordered today are listed in the Patient Instructions below. Patient Instructions  Medication Instructions:  1) DISCONTINUE Metoprolol 2) START Carvedilol 6.25mg - one tablet twice daily. 3) START Spironolactone 12.5mg  once daily.  Labwork: Your physician recommends that you return for lab work in: 10-14 days (BMET, BNP)   Testing/Procedures: Your physician has requested that you have an echocardiogram in July 2018. Echocardiography is a painless test that uses sound waves to create images of your heart. It provides your doctor with information about the size and shape of your heart and how well your heart's chambers and valves are working. This procedure takes approximately one hour. There are no restrictions for this procedure.    Follow-Up: Your physician recommends that you schedule a follow-up appointment in: 6-8 weeks with Dr. [name].    Any Other Special Instructions Will Be Listed Below (If Applicable).     If you need a refill on your cardiac medications before your next appointment, please call your pharmacy.      Signed, Sinclair Grooms, MD  06/15/2016 4:51 PM    [facility] [address], [place], [place]  [address] Phone: [phone]; Fax: [phone]

## 2016-06-15 ENCOUNTER — Ambulatory Visit (INDEPENDENT_AMBULATORY_CARE_PROVIDER_SITE_OTHER): Payer: Medicare Other | Admitting: Interventional Cardiology

## 2016-06-15 ENCOUNTER — Encounter: Payer: Self-pay | Admitting: Interventional Cardiology

## 2016-06-15 VITALS — BP 136/62 | HR 60 | Ht 72.0 in | Wt 230.0 lb

## 2016-06-15 DIAGNOSIS — I5042 Chronic combined systolic (congestive) and diastolic (congestive) heart failure: Secondary | ICD-10-CM

## 2016-06-15 DIAGNOSIS — E784 Other hyperlipidemia: Secondary | ICD-10-CM

## 2016-06-15 DIAGNOSIS — I493 Ventricular premature depolarization: Secondary | ICD-10-CM | POA: Diagnosis not present

## 2016-06-15 DIAGNOSIS — I350 Nonrheumatic aortic (valve) stenosis: Secondary | ICD-10-CM | POA: Diagnosis not present

## 2016-06-15 DIAGNOSIS — I110 Hypertensive heart disease with heart failure: Secondary | ICD-10-CM

## 2016-06-15 DIAGNOSIS — I10 Essential (primary) hypertension: Secondary | ICD-10-CM

## 2016-06-15 DIAGNOSIS — E7849 Other hyperlipidemia: Secondary | ICD-10-CM

## 2016-06-15 MED ORDER — SPIRONOLACTONE 25 MG PO TABS: 12.5000 mg | ORAL_TABLET | Freq: Every day | ORAL | 3 refills | Status: DC

## 2016-06-15 MED ORDER — CARVEDILOL 6.25 MG PO TABS: 6.2500 mg | ORAL_TABLET | Freq: Two times a day (BID) | ORAL | 3 refills | Status: DC

## 2016-06-15 NOTE — Patient Instructions (Signed)
Medication Instructions:  1) DISCONTINUE Metoprolol 2) START Carvedilol 6.25mg - one tablet twice daily. 3) START Spironolactone 12.5mg  once daily.  Labwork: Your physician recommends that you return for lab work in: 10-14 days (BMET, BNP)   Testing/Procedures: Your physician has requested that you have an echocardiogram in July 2018. Echocardiography is a painless test that uses sound waves to create images of your heart. It provides your doctor with information about the size and shape of your heart and how well your heart's chambers and valves are working. This procedure takes approximately one hour. There are no restrictions for this procedure.    Follow-Up: Your physician recommends that you schedule a follow-up appointment in: 6-8 weeks with Dr. [name].    Any Other Special Instructions Will Be Listed Below (If Applicable).     If you need a refill on your cardiac medications before your next appointment, please call your pharmacy.

## 2016-06-29 ENCOUNTER — Other Ambulatory Visit: Payer: Medicare Other | Admitting: *Deleted

## 2016-06-29 DIAGNOSIS — I5042 Chronic combined systolic (congestive) and diastolic (congestive) heart failure: Secondary | ICD-10-CM | POA: Diagnosis not present

## 2016-06-29 LAB — BASIC METABOLIC PANEL
BUN/Creatinine Ratio: 16 (ref 10–24)
BUN: 19 mg/dL (ref 8–27)
CALCIUM: 9.3 mg/dL (ref 8.6–10.2)
CO2: 23 mmol/L (ref 18–29)
CREATININE: 1.2 mg/dL (ref 0.76–1.27)
Chloride: 100 mmol/L (ref 96–106)
GFR calc Af Amer: 66 mL/min/{1.73_m2} (ref >59)
GFR, EST NON AFRICAN AMERICAN: 57 mL/min/{1.73_m2} — AB (ref >59)
Glucose: 131 mg/dL — ABNORMAL HIGH (ref 65–99)
Potassium: 4.2 mmol/L (ref 3.5–5.2)
Sodium: 140 mmol/L (ref 134–144)

## 2016-06-30 ENCOUNTER — Telehealth: Payer: Self-pay | Admitting: Interventional Cardiology

## 2016-06-30 LAB — PRO B NATRIURETIC PEPTIDE: NT-Pro BNP: 448 pg/mL (ref 0–486)

## 2016-06-30 NOTE — Telephone Encounter (Signed)
Follow Up:    Returning your call,from today

## 2016-06-30 NOTE — Telephone Encounter (Signed)
Informed pt of lab results. Pt verbalized understanding. 

## 2016-07-03 ENCOUNTER — Telehealth: Payer: Self-pay | Admitting: *Deleted

## 2016-07-03 DIAGNOSIS — I5042 Chronic combined systolic (congestive) and diastolic (congestive) heart failure: Secondary | ICD-10-CM

## 2016-07-03 MED ORDER — SPIRONOLACTONE 25 MG PO TABS: 25.0000 mg | ORAL_TABLET | Freq: Every day | ORAL | 3 refills | Status: DC

## 2016-07-03 NOTE — Telephone Encounter (Signed)
Notes recorded by [name], MD on 07/03/2016 at 5:47 PM EDT Let the patient know he should increase Aldactone to 25 mg daily. Basic metabolic panel in 2 weeks. Let him know this will slightly increase the intensity of diuretic therapy and may help improve her breathing and decrease weight (related to fluid removal). A copy will be sent to Kandice Hams, MD  Spoke with wife [facility]) and informed her of recommendations per Dr. [name].  Wife verbalized understanding and was in agreement with this plan.  Pt will come for BMET on 07/16/16.  Wife appreciative for call.

## 2016-07-16 ENCOUNTER — Other Ambulatory Visit: Payer: Medicare Other | Admitting: *Deleted

## 2016-07-16 DIAGNOSIS — I10 Essential (primary) hypertension: Secondary | ICD-10-CM

## 2016-07-16 LAB — BASIC METABOLIC PANEL
BUN/Creatinine Ratio: 20 (ref 10–24)
BUN: 28 mg/dL — ABNORMAL HIGH (ref 8–27)
CALCIUM: 9.3 mg/dL (ref 8.6–10.2)
CO2: 24 mmol/L (ref 18–29)
CREATININE: 1.43 mg/dL — AB (ref 0.76–1.27)
Chloride: 100 mmol/L (ref 96–106)
GFR calc Af Amer: 53 mL/min/{1.73_m2} — ABNORMAL LOW (ref >59)
GFR, EST NON AFRICAN AMERICAN: 46 mL/min/{1.73_m2} — AB (ref >59)
Glucose: 129 mg/dL — ABNORMAL HIGH (ref 65–99)
Potassium: 4.6 mmol/L (ref 3.5–5.2)
Sodium: 141 mmol/L (ref 134–144)

## 2016-07-20 ENCOUNTER — Telehealth: Payer: Self-pay | Admitting: Interventional Cardiology

## 2016-07-20 NOTE — Telephone Encounter (Signed)
Spoke with pt and went over results.  Pt states breathing is not any better.  Spoke with Dr. [name] and he said to have pt d/c Spironolactone and call if breathing is worse after stopping med.  Advised pt of recommendations.  Pt verbalized understanding and was in agreement with this plan

## 2016-07-20 NOTE — Telephone Encounter (Signed)
New message    Pt is calling returning call to nurse from Friday. He said if he can not be reached on his office phone, to call his cell phone [phone].

## 2016-07-29 NOTE — Progress Notes (Signed)
Cardiology Office Note    Date:  07/30/2016   ID:  Robert Horn, DOB 1933-04-08, MRN [id number]  PCP:  Kandice Hams, MD  Cardiologist: Sinclair Grooms, MD   Chief Complaint  Patient presents with  . Follow-up    Hypertension  . Congestive Heart Failure    History of Present Illness:  Robert Horn is a 83 y.o. male with aortic stenosis, essential hypertension, chronic combined systolic heart failure, conduction abnormality including first-degree AV block (limiting titration of beta blocker therapy) and recent hospitalization for CHF when he was off medical therapy.  Late last summer, Robert Horn developed pulmonary congestion that was improved with the addition of balloon diuretic. Since that time he has not had dyspnea at rest or orthopnea but he does notice dyspnea upon walking up his driveway after getting the paper each morning. This is concerning to him. He worries that he may have another episode of significant shortness of breath. Because of the episode, he underwent coronary angiography along with left and right heart catheterization. He did not have any significant coronary disease and the hemodynamics related to his heart cath were unremarkable. He is felt to have mild to moderate aortic stenosis. Since heart catheterization his medical regimen his been adjusted. He is now on an ARB and carvedilol. Metoprolol was discontinued. Diuretic therapy has been optimized. The combination of furosemide 40 mg per day and spironolactone lead to azotemia with increasing creatinine. He is now only on furosemide 40 mg per day. Additional therapy includes amlodipine and hydralazine for blood pressure control. Carvedilol was just substituted for metoprolol 6 weeks ago.  Past Medical History:  Diagnosis Date  . Dyslipidemia 10/27/2015  . Elevated PSA 10/27/2015  . Erectile dysfunction 10/27/2015  . Hypertension 10/27/2015  . Hypogonadism male 10/27/2015  . Obesity 10/27/2015  . Pneumonia  10/27/2015  . Rotator cuff tear 10/27/2015    Past Surgical History:  Procedure Laterality Date  . CARDIAC CATHETERIZATION N/A 11/14/2015   Procedure: Left Heart Cath and Coronary Angiography;  Surgeon: [name], MD;  Location: [facility];  Service: Cardiovascular;  Laterality: N/A;    Current Medications: Outpatient Medications Prior to Visit  Medication Sig Dispense Refill  . amLODipine (NORVASC) 10 MG tablet Take 1 tablet (10 mg total) by mouth daily. 30 tablet 11  . aspirin (ASPIRIN EC) 81 MG EC tablet Take 81 mg by mouth daily. Swallow whole.    . carvedilol (COREG) 6.25 MG tablet Take 1 tablet (6.25 mg total) by mouth 2 (two) times daily. 180 tablet 3  . furosemide (LASIX) 40 MG tablet TAKE 1 TABLET (40 MG TOTAL) BY MOUTH DAILY. 30 tablet 1  . hydrALAZINE (APRESOLINE) 25 MG tablet TAKE 1 TABLET BY MOUTH EVERY 8 HOURS 90 tablet 11  . Multiple Vitamin (MULTIVITAMIN WITH MINERALS) TABS tablet Take 1 tablet by mouth daily. 30 tablet 1  . olmesartan (BENICAR) 40 MG tablet TAKE 1 TABLET (40 MG TOTAL) BY MOUTH DAILY. 30 tablet 1  . omega-3 acid ethyl esters (LOVAZA) 1 g capsule TAKE ONE CAPSULE BY MOUTH EVERY DAY 30 capsule 11  . VITAMIN B COMPLEX-C PO Take 1 tablet by mouth daily.    Marland Kitchen docusate sodium (COLACE) 100 MG capsule Take 1 capsule (100 mg total) by mouth 2 (two) times daily. (Patient not taking: Reported on 07/30/2016) 60 capsule 0   No facility-administered medications prior to visit.      Allergies:   Patient has no known allergies.  Social History   Social History  . Marital status: Single    Spouse name: N/A  . Number of children: N/A  . Years of education: N/A   Social History Main Topics  . Smoking status: Former Smoker    Types: Cigarettes  . Smokeless tobacco: Never Used  . Alcohol use No  . Drug use: No  . Sexual activity: Not Asked   Other Topics Concern  . None   Social History Narrative  . None     Family History:  The patient's  family history includes Diabetes in his mother; Heart disease in his mother; Pulmonary embolism in his father.   ROS:   Please see the history of present illness.    Concerned about his overall prognosis. Has no difficulty playing golf. He is not short of breath when he walks up inclines to get to the [place]. 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 He denies lower extremity swelling.  All other systems reviewed and are negative.   PHYSICAL EXAM:   VS:  BP 124/66 (BP Location: Right Arm)   Pulse (!) 55   Ht 6' (1.829 m)   Wt 237 lb 6.4 oz (107.7 kg)   BMI 32.20 kg/m    GEN: Well nourished, well developed, in no acute distress  HEENT: normal  Neck: no JVD, carotid bruits, or masses Cardiac: RRR;  rubs, or gallops,no edema. Course 2 to 3/6 crescendo decrescendo murmur of aortic stenosis.  Respiratory:  clear to auscultation bilaterally, normal work of breathing GI: soft, nontender, nondistended, + BS MS: no deformity or atrophy  Skin: warm and dry, no rash Neuro:  Alert and Oriented x 3, Strength and sensation are intact Psych: euthymic mood, full affect  Wt Readings from Last 3 Encounters:  07/30/16 237 lb 6.4 oz (107.7 kg)  06/15/16 230 lb (104.3 kg)  12/17/15 228 lb (103.4 kg)      Studies/Labs Reviewed:   EKG:  EKG  Not done  Recent Labs: 10/26/2015: ALT 22; B Natriuretic Peptide 839.1 11/11/2015: Hemoglobin 12.7; Platelets 146 06/29/2016: NT-Pro BNP 448 07/16/2016: BUN 28; Creatinine, Ser 1.43; Potassium 4.6; Sodium 141   Lipid Panel No results found for: CHOL, TRIG, HDL, CHOLHDL, VLDL, LDLCALC, LDLDIRECT  Additional studies/ records that were reviewed today include:  Cardiac catheterization 11/14/15:  Conclusion  LVEDP 26 mmHg, mean pulmonary capillary wedge pressure 15 mmHg,, mean transvalvular gradient 18 mmHg.     Calcific aortic valve disease with mild aortic regurgitation and mild to moderate aortic  stenosis.  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  Widely patent normal appearing coronary arteries.  Estimated left ventricular ejection fraction is 30-35% with elevated end-diastolic pressure and global hypokinesis. Findings are compatible with nonischemic cardiomyopathy  Mild pulmonary hypertension  RECOMMENDATIONS:   Optimize medical therapy     ASSESSMENT:    1. Nonrheumatic aortic valve stenosis   2. Chronic combined systolic and diastolic heart failure ([place])   3. Hypertensive heart disease with heart failure ([place])   4. PVC's (premature ventricular contractions)   5. Other hyperlipidemia   6. CKD (chronic kidney disease) stage 2, GFR 60-89 ml/min      PLAN:  In order of problems listed above:  1. There is concern about low pressure gradient severe aortic stenosis. He is on adequate heart failure therapy including a diuretic at this time. We will repeat echo after he has been on carvedilol for  90 days. This would be in the early summer. If LV function remains low I will need to have a dobutamine echo performed to exclude the possibility of critical aortic stenosis with normal/low gradient 2. He is on 40 mg of olmesartan and 6.25 mg twice a day of carvedilol. The carvedilol cannot be further increased because of relative bradycardia. Plan echocardiogram in 6 weeks to assess LV function and size. This will also reassess the aortic valve. 3. Hypertension is now under very good control. It is possible that LV dysfunction is related to poorly controlled blood pressure. This is significantly better on the current medical regimen. 4. Not clinically a problem at this time. 5. Not addressed. 6. On combination furosemide and low-dose Aldactone 12.5 mg per day creatinine increased to 1.4. Aldactone is been discontinued.  2-D Doppler echocardiogram in 6 weeks. Clinical follow-up thereafter. May need dobutamine echo to exclude low pressure gradient  severe aortic stenosis. This next echo will guide therapy.    Medication Adjustments/Labs and Tests Ordered: Current medicines are reviewed at length with the patient today.  Concerns regarding medicines are outlined above.  Medication changes, Labs and Tests ordered today are listed in the Patient Instructions below. There are no Patient Instructions on file for this visit.   Signed, Sinclair Grooms, MD  07/30/2016 3:19 PM    [facility] [place], [place], [place]  [address] Phone: [phone]; Fax: [phone]

## 2016-07-30 ENCOUNTER — Encounter: Payer: Self-pay | Admitting: Interventional Cardiology

## 2016-07-30 ENCOUNTER — Encounter (INDEPENDENT_AMBULATORY_CARE_PROVIDER_SITE_OTHER): Payer: Self-pay

## 2016-07-30 ENCOUNTER — Ambulatory Visit (INDEPENDENT_AMBULATORY_CARE_PROVIDER_SITE_OTHER): Payer: Medicare Other | Admitting: Interventional Cardiology

## 2016-07-30 VITALS — BP 124/66 | HR 55 | Ht 72.0 in | Wt 237.4 lb

## 2016-07-30 DIAGNOSIS — N182 Chronic kidney disease, stage 2 (mild): Secondary | ICD-10-CM

## 2016-07-30 DIAGNOSIS — I493 Ventricular premature depolarization: Secondary | ICD-10-CM | POA: Diagnosis not present

## 2016-07-30 DIAGNOSIS — E7849 Other hyperlipidemia: Secondary | ICD-10-CM

## 2016-07-30 DIAGNOSIS — E784 Other hyperlipidemia: Secondary | ICD-10-CM | POA: Diagnosis not present

## 2016-07-30 DIAGNOSIS — I350 Nonrheumatic aortic (valve) stenosis: Secondary | ICD-10-CM | POA: Diagnosis not present

## 2016-07-30 DIAGNOSIS — I110 Hypertensive heart disease with heart failure: Secondary | ICD-10-CM | POA: Diagnosis not present

## 2016-07-30 DIAGNOSIS — I5042 Chronic combined systolic (congestive) and diastolic (congestive) heart failure: Secondary | ICD-10-CM

## 2016-07-30 NOTE — Patient Instructions (Signed)
Medication Instructions:  None  Labwork: None  Testing/Procedures: Keep current appointment for echocardiogram.  Follow-Up: Your physician wants you to follow-up in: 4 months with Dr. [name].  You will receive a reminder letter in the mail two months in advance. If you don't receive a letter, please call our office to schedule the follow-up appointment.   Any Other Special Instructions Will Be Listed Below (If Applicable).     If you need a refill on your cardiac medications before your next appointment, please call your pharmacy.

## 2016-08-14 ENCOUNTER — Other Ambulatory Visit: Payer: Self-pay | Admitting: Interventional Cardiology

## 2016-10-08 ENCOUNTER — Encounter: Payer: Self-pay | Admitting: Interventional Cardiology

## 2016-10-08 ENCOUNTER — Ambulatory Visit (HOSPITAL_COMMUNITY): Payer: Medicare Other | Attending: Cardiology

## 2016-10-08 ENCOUNTER — Other Ambulatory Visit: Payer: Self-pay

## 2016-10-08 DIAGNOSIS — I420 Dilated cardiomyopathy: Secondary | ICD-10-CM | POA: Diagnosis not present

## 2016-10-08 DIAGNOSIS — I062 Rheumatic aortic stenosis with insufficiency: Secondary | ICD-10-CM | POA: Insufficient documentation

## 2016-10-08 DIAGNOSIS — R9439 Abnormal result of other cardiovascular function study: Secondary | ICD-10-CM | POA: Insufficient documentation

## 2016-10-08 DIAGNOSIS — I5042 Chronic combined systolic (congestive) and diastolic (congestive) heart failure: Secondary | ICD-10-CM

## 2016-10-12 ENCOUNTER — Telehealth: Payer: Self-pay | Admitting: Interventional Cardiology

## 2016-10-12 NOTE — Telephone Encounter (Signed)
Fllow Up:   Pt wants to make sure that Dr [name] gives him a call,when he have time. He says he needs to talk to him asap please.

## 2016-10-13 ENCOUNTER — Ambulatory Visit: Payer: Medicare Other | Admitting: Interventional Cardiology

## 2016-10-13 NOTE — Progress Notes (Deleted)
Cardiology Office Note    Date:  10/13/2016   ID:  Robert Horn, DOB 1933/11/15, MRN [id number]  PCP:  [name], MD  Cardiologist: Sinclair Grooms, MD   No chief complaint on file.   History of Present Illness:  Robert Horn is a 83 y.o. male with presumed severe aortic stenosis, essential hypertension, chronic combined systolic heart failure, conduction abnormality including first-degree AV block (limiting titration of beta blocker therapy) and recent hospitalization for CHF when he was off medical therapy.    Past Medical History:  Diagnosis Date  . Dyslipidemia 10/27/2015  . Elevated PSA 10/27/2015  . Erectile dysfunction 10/27/2015  . Hypertension 10/27/2015  . Hypogonadism male 10/27/2015  . Obesity 10/27/2015  . Pneumonia 10/27/2015  . Rotator cuff tear 10/27/2015    Past Surgical History:  Procedure Laterality Date  . CARDIAC CATHETERIZATION N/A 11/14/2015   Procedure: Left Heart Cath and Coronary Angiography;  Surgeon: [name], MD;  Location: [facility];  Service: Cardiovascular;  Laterality: N/A;    Current Medications: Outpatient Medications Prior to Visit  Medication Sig Dispense Refill  . amLODipine (NORVASC) 10 MG tablet Take 1 tablet (10 mg total) by mouth daily. 30 tablet 11  . aspirin (ASPIRIN EC) 81 MG EC tablet Take 81 mg by mouth daily. Swallow whole.    . carvedilol (COREG) 6.25 MG tablet Take 1 tablet (6.25 mg total) by mouth 2 (two) times daily. 180 tablet 3  . furosemide (LASIX) 40 MG tablet TAKE 1 TABLET BY MOUTH  DAILY 90 tablet 3  . hydrALAZINE (APRESOLINE) 25 MG tablet TAKE 1 TABLET BY MOUTH EVERY 8 HOURS 90 tablet 11  . Multiple Vitamin (MULTIVITAMIN WITH MINERALS) TABS tablet Take 1 tablet by mouth daily. 30 tablet 1  . olmesartan (BENICAR) 40 MG tablet TAKE 1 TABLET BY MOUTH  DAILY 90 tablet 3  . omega-3 acid ethyl esters (LOVAZA) 1 g capsule TAKE ONE CAPSULE BY MOUTH EVERY DAY 30 capsule 11  . VITAMIN B COMPLEX-C PO  Take 1 tablet by mouth daily.     No facility-administered medications prior to visit.      Allergies:   Patient has no known allergies.   Social History   Social History  . Marital status: Single    Spouse name: N/A  . Number of children: N/A  . Years of education: N/A   Social History Main Topics  . Smoking status: Former Smoker    Types: Cigarettes  . Smokeless tobacco: Never Used  . Alcohol use No  . Drug use: No  . Sexual activity: Not on file   Other Topics Concern  . Not on file   Social History Narrative  . No narrative on file     Family History:  The patient's ***family history includes Diabetes in his mother; Heart disease in his mother; Pulmonary embolism in his father.   ROS:   Please see the history of present illness.    ***  All other systems reviewed and are negative.   PHYSICAL EXAM:   VS:  There were no vitals taken for this visit.   GEN: Well nourished, well developed, in no acute distress  HEENT: normal  Neck: no JVD, carotid bruits, or masses Cardiac: ***RRR; no murmurs, rubs, or gallops,no edema  Respiratory:  clear to auscultation bilaterally, normal work of breathing GI: soft, nontender, nondistended, + BS MS: no deformity or atrophy  Skin: warm and dry, no rash Neuro:  Alert and  Oriented x 3, Strength and sensation are intact Psych: euthymic mood, full affect  Wt Readings from Last 3 Encounters:  07/30/16 237 lb 6.4 oz (107.7 kg)  06/15/16 230 lb (104.3 kg)  12/17/15 228 lb (103.4 kg)      Studies/Labs Reviewed:   EKG:  EKG  ***  Recent Labs: 10/26/2015: ALT 22; B Natriuretic Peptide 839.1 11/11/2015: Hemoglobin 12.7; Platelets 146 06/29/2016: NT-Pro BNP 448 07/16/2016: BUN 28; Creatinine, Ser 1.43; Potassium 4.6; Sodium 141   Lipid Panel No results found for: CHOL, TRIG, HDL, CHOLHDL, VLDL, LDLCALC, LDLDIRECT  Additional studies/ records that were reviewed today include:  ***    ASSESSMENT:    1. Chronic combined  systolic and diastolic heart failure ([place])   2. Essential hypertension   3. Nonrheumatic aortic valve stenosis   4. PVC's (premature ventricular contractions)   5. CKD (chronic kidney disease) stage 2, GFR 60-89 ml/min   6. Other hyperlipidemia      PLAN:  In order of problems listed above:  1. ***    Medication Adjustments/Labs and Tests Ordered: Current medicines are reviewed at length with the patient today.  Concerns regarding medicines are outlined above.  Medication changes, Labs and Tests ordered today are listed in the Patient Instructions below. There are no Patient Instructions on file for this visit.   Signed, Sinclair Grooms, MD  10/13/2016 11:40 AM    [facility] [address], [place], [place]  [address] Phone: [phone]; Fax: [phone]

## 2016-10-13 NOTE — Telephone Encounter (Signed)
Dr. [name] called and spoke with pt.  Pt coming in today for appt.

## 2016-10-13 NOTE — Telephone Encounter (Signed)
Please set Mr. Fredell up to see either Dr. Gwenlyn Fudge in the valve clinic.

## 2016-10-22 ENCOUNTER — Encounter: Payer: Self-pay | Admitting: Cardiovascular Disease

## 2016-10-22 ENCOUNTER — Ambulatory Visit (INDEPENDENT_AMBULATORY_CARE_PROVIDER_SITE_OTHER): Payer: Medicare Other | Admitting: Cardiovascular Disease

## 2016-10-22 VITALS — BP 136/60 | HR 56 | Ht 72.0 in | Wt 231.8 lb

## 2016-10-22 DIAGNOSIS — I350 Nonrheumatic aortic (valve) stenosis: Secondary | ICD-10-CM | POA: Diagnosis not present

## 2016-10-22 NOTE — Patient Instructions (Addendum)
Medication Instructions:  Your physician recommends that you continue on your current medications as directed. Please refer to the Current Medication list given to you today.  Labwork: Your physician recommends that you have lab work today: BMP  Testing/Procedures: Your physician has requested that you have cardiac CT. Cardiac computed tomography (CT) is a painless test that uses an x-ray machine to take clear, detailed pictures of your heart. For further information please visit [email or web address]. Please follow instruction sheet as given.   Non-Cardiac CT Angiography (CTA), is a special type of CT scan that uses a computer to produce multi-dimensional views of major blood vessels throughout the body. In CT angiography, a contrast material is injected through an IV to help visualize the blood vessels (CTA Chest/Abdomen and Pelvis)  Pre-CT instructions: 1. No solid foods, No caffeine or smoking 4 hours prior to CT. You can have liquids but no caffeine. 2. No herbal supplements by mouth 24 hours prior to CT. 3. No sexual enhancement drugs/herbs 72 hours prior to CT.   Your physician has recommended that you have a pulmonary function test. Pulmonary Function Tests are a group of tests that measure how well air moves in and out of your lungs.  Your physician has requested that you have a carotid duplex. This test is an ultrasound of the carotid arteries in your neck. It looks at blood flow through these arteries that supply the brain with blood. Allow one hour for this exam. There are no restrictions or special instructions.  Your physician has requested Outpatient Physical Therapy evaluation.  This will be done at [facility] [place], [place] [address]  Follow-Up: You have been referred to Dr Roxy Manns and Dr Cyndia Bent at [facility] for further TAVR evaluation.  [id number], [place], [place] [address]   Any Other  Special Instructions Will Be Listed Below (If Applicable).     If you need a refill on your cardiac medications before your next appointment, please call your pharmacy.

## 2016-10-22 NOTE — Progress Notes (Signed)
Cardiology Office Note Date:  10/22/2016   ID:  Robert Horn, DOB 11/13/1933, MRN [id number]  PCP:  [name], MD  Cardiologist:  Dr [name]  Chief Complaint  Patient presents with  . Aortic Stenosis    TAVR consult, Dr. [name]     History of Present Illness: Robert Horn is a 83 y.o. male who presents for TAVR evaluation, referred by Dr [name].  The patient has longstanding hypertension dating back several decades. He states his BP has been controlled on antihypertensive Rx. He's had a heart murmur dating back to the 1980's. The patient is here alone today. He's been followed by Dr [name] with chronic diastolic heart failure and moderate aortic stenosis. About one year ago he ran out of his antihypertensive and was hospitalized with acute heart failure. Medications were adjusted, he was diuresed, and an updated echo was performed. This demonstrated reduced LV function with LVEF 40-45% and moderate aortic stenosis. A recent echo was repeated and demonstrated progressive decline in LV function with LVEF <30% associated with a restrictive filling pattern and worsening aortic stenosis wit ha mean radient of 32 mmHg, dimensionless index of 0.21, and calculated AVA of 0.8 square cm.   The patient is here alone today. 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 He admits to progressive fatigue and shortness of breath with activity, progressive over the past 6 months. He is symptomatic with shortness of breath at less than one block of walking. No chest pain, chest pressure, edema, lightheadedness, or syncope.   Past Medical History:  Diagnosis Date  . Dyslipidemia 10/27/2015  . Elevated PSA 10/27/2015  . Erectile dysfunction 10/27/2015  . Hypertension 10/27/2015  . Hypogonadism male 10/27/2015  . Obesity 10/27/2015  . Pneumonia 10/27/2015  . Rotator cuff tear 10/27/2015    Past Surgical History:  Procedure Laterality Date  . CARDIAC CATHETERIZATION N/A 11/14/2015   Procedure:  Left Heart Cath and Coronary Angiography;  Surgeon: [name], MD;  Location: [facility];  Service: Cardiovascular;  Laterality: N/A;    Current Outpatient Prescriptions  Medication Sig Dispense Refill  . amLODipine (NORVASC) 10 MG tablet Take 1 tablet (10 mg total) by mouth daily. 30 tablet 11  . aspirin (ASPIRIN EC) 81 MG EC tablet Take 81 mg by mouth daily. Swallow whole.    . carvedilol (COREG) 6.25 MG tablet Take 1 tablet (6.25 mg total) by mouth 2 (two) times daily. 180 tablet 3  . furosemide (LASIX) 40 MG tablet TAKE 1 TABLET BY MOUTH  DAILY 90 tablet 3  . hydrALAZINE (APRESOLINE) 25 MG tablet TAKE 1 TABLET BY MOUTH EVERY 8 HOURS 90 tablet 11  . Multiple Vitamin (MULTIVITAMIN WITH MINERALS) TABS tablet Take 1 tablet by mouth daily. 30 tablet 1  . olmesartan (BENICAR) 40 MG tablet TAKE 1 TABLET BY MOUTH  DAILY 90 tablet 3  . omega-3 acid ethyl esters (LOVAZA) 1 g capsule TAKE ONE CAPSULE BY MOUTH EVERY DAY 30 capsule 11  . VITAMIN B COMPLEX-C PO Take 1 tablet by mouth daily.     No current facility-administered medications for this visit.     Allergies:   Patient has no known allergies.   Social History:  The patient  reports that he has quit smoking. His smoking use included Cigarettes. He has never used smokeless tobacco. He reports that he does not drink alcohol or use drugs.   Family History:  The patient's  family history includes Diabetes in his mother; Heart  disease in his mother; Pulmonary embolism in his father.    ROS:  Please see the history of present illness.  Otherwise, review of systems is positive for hearing loss, wheezing.  All other systems are reviewed and negative.    PHYSICAL EXAM: VS:  BP 136/60   Pulse (!) 56   Ht 6' (1.829 m)   Wt 231 lb 12.8 oz (105.1 kg)   BMI 31.44 kg/m  , BMI Body mass index is 31.44 kg/m. GEN: Well nourished, well developed, pleasant elderly male in no acute distress  HEENT: normal  Neck: no JVD, no masses. Delayed  carotid upstrokes with bilateral carotid bruits Cardiac: RRR with 3/6 harsh late peaking systolic murmur at the RUSB, no diastolic murmur               Respiratory:  clear to auscultation bilaterally, normal work of breathing GI: soft, nontender, nondistended, + BS MS: no deformity or atrophy  Ext: no pretibial edema, pedal pulses 2+= bilaterally Skin: warm and dry, no rash Neuro:  Strength and sensation are intact Psych: euthymic mood, full affect  EKG:  EKG is ordered today. The ekg ordered today shows sinus bradycardia 56 bpm, PVC, incomplete RBBB  Recent Labs: 10/26/2015: ALT 22; B Natriuretic Peptide 839.1 11/11/2015: Hemoglobin 12.7; Platelets 146 06/29/2016: NT-Pro BNP 448 07/16/2016: BUN 28; Creatinine, Ser 1.43; Potassium 4.6; Sodium 141   Lipid Panel  No results found for: CHOL, TRIG, HDL, CHOLHDL, VLDL, LDLCALC, LDLDIRECT    Wt Readings from Last 3 Encounters:  10/22/16 231 lb 12.8 oz (105.1 kg)  07/30/16 237 lb 6.4 oz (107.7 kg)  06/15/16 230 lb (104.3 kg)     Cardiac Studies Reviewed: Echo 10-08-2016: Study Conclusions  - Left ventricle: The cavity size was mildly dilated. Wall   thickness was normal. Systolic function was severely reduced. The   estimated ejection fraction was in the range of 25% to 30%.   Diffuse hypokinesis. Doppler parameters are consistent with   restrictive physiology, indicative of decreased left ventricular   diastolic compliance and/or increased left atrial pressure.   Doppler parameters are consistent with high ventricular filling   pressure. - Aortic valve: Valve mobility was restricted. There was severe   stenosis. There was trivial regurgitation. - Ascending aorta: The ascending aorta was mildly dilated. - Left atrium: The atrium was severely dilated. - Right ventricle: The cavity size was mildly dilated. - Right atrium: The atrium was mildly dilated. - Pulmonary arteries: Systolic pressure was mildly  increased.  Impressions:  - Severe global reduction in LV systolic function; mild LVE;   restrictive filling; calcified aortic valve with severe AS (mean   gradient 32 mmHg; AVA 0.8 cm2); trace AI; mildly dilated   ascending aorta; severe LAE; mild RAE/RVE; mildly elevated   pulmonary pressure.  Cardiac Cath 11-14-2015: Conclusion     There is moderate to severe left ventricular systolic dysfunction.  LV end diastolic pressure is moderately elevated.  The left ventricular ejection fraction is 35-45% by visual estimate.  There is moderate aortic valve stenosis. There is trivial (1+) aortic regurgitation.    Calcific aortic valve disease with mild aortic regurgitation and mild to moderate aortic stenosis.  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  Widely patent normal appearing coronary arteries.  Estimated left ventricular ejection fraction is 30-35% with elevated end-diastolic pressure and global hypokinesis. Findings are compatible with nonischemic cardiomyopathy  Mild pulmonary hypertension  RECOMMENDATIONS:   Optimize medical therapy  Indications   Chronic systolic HF (heart failure) (HCC) [I50.22 (ICD-10-CM)]  AVD (aortic valve disease) [I35.9 (ICD-10-CM)]  Essential hypertension [I10 (ICD-10-CM)]  Procedural Details/Technique   Technical Details The right radial area was sterilely prepped and draped. Intravenous sedation with Versed and fentanyl was administered. 1% Xylocaine was infiltrated to achieve local analgesia. A double wall stick with an angiocath was utilized to obtain intra-arterial access. The modified Seldinger technique was used to place a 62F " Slender" sheath in the right radial artery. Weight based heparin was administered. Coronary angiography was done using 5 F catheters. Right coronary angiography was performed with a JR4. Left ventricular hemodymic recordings and angiography was done using the JR 4 catheter  and hand injection. Left coronary angiography was performed with a JL 3.5 cm. The right coronary artery could not be selectively engaged with the [place]. We also had difficulty selectively engaging the left coronary with the JL 3.5 cm catheter. We will ultimately able to perform angiography on each coronary using a 3.5 cm 5 [place] EBU catheter.  Supravalvular aortography was performed using a straight pigtail catheter at 18 cc/s for a total of 40 cc of contrast. This was performed with power injection.  Right heart catheterization was performed by exchanging an antecubital angiocath IV for a 5 French brachial sheath. 1% Xylocaine local infiltration at the IV site was given prior to sheath exchange. A double glove technique was used. The modified Seldinger technique was employed. After sheath insertion, right heart cath was performed using a 5 French balloon tipped catheter. Pressures were recorded in each chamber. A main pulmonary artery O2 saturation was obtained. I wedge pressure was also measured.   Hemostasis was achieved using a pneumatic band.  During this procedure the patient is administered a total of Versed 2 mg and Fentanyl 50 mg to achieve and maintain moderate conscious sedation. The patient's heart rate, blood pressure, and oxygen saturation are monitored continuously during the procedure. The period of conscious sedation is 37 minutes, of which I was present face-to-face 100% of this time.   Estimated blood loss <50 mL. . During this procedure the patient was administered the following to achieve and maintain moderate conscious sedation: Versed 2 mg, Fentanyl 50 mcg, while the patient's heart rate, blood pressure, and oxygen saturation were continuously monitored. The period of conscious sedation was 37 minutes, of which I was present face-to-face 100% of this time.    Coronary Findings   Dominance: Co-dominant  Left Anterior Descending  Third Diagonal Branch  Vessel is small in size.   Wall Motion   Resting               Left Heart   Left Ventricle The left ventricle is dilated. There is moderate to severe left ventricular systolic dysfunction. LV end diastolic pressure is moderately elevated. The left ventricular ejection fraction is 35-45% by visual estimate. There are LV function abnormalities due to global hypokinesis.    Aortic Valve There is moderate aortic valve stenosis. There is trivial (1+) aortic regurgitation. The aortic valve is calcified. There is restricted aortic valve motion.    Coronary Diagrams   Diagnostic Diagram       Implants     No implant documentation for this case.  PACS Images   Show images for Cardiac catheterization   Link to Procedure Log   Procedure Log    Hemo Data    Most Recent Value  Fick Cardiac Output 5.85 L/min  Fick Cardiac Output Index 2.57 (  L/min)/BSA  Aortic Mean Gradient 18.2 mmHg  Aortic Peak Gradient 15 mmHg  Aortic Valve Area 1.73  Aortic Value Area Index 0.76 cm2/BSA  RA A Wave 11 mmHg  RA V Wave 10 mmHg  RA Mean 7 mmHg  RV Systolic Pressure 45 mmHg  RV Diastolic Pressure 3 mmHg  RV EDP 10 mmHg  PA Systolic Pressure 43 mmHg  PA Diastolic Pressure 14 mmHg  PA Mean 25 mmHg  PW A Wave 21 mmHg  PW V Wave 18 mmHg  PW Mean 15 mmHg  AO Systolic Pressure 300 mmHg  AO Diastolic Pressure 74 mmHg  AO Mean 762 mmHg  LV Systolic Pressure 263 mmHg  LV Diastolic Pressure 13 mmHg  LV EDP 28 mmHg  Arterial Occlusion Pressure Extended Systolic Pressure 335 mmHg  Arterial Occlusion Pressure Extended Diastolic Pressure 72 mmHg  Arterial Occlusion Pressure Extended Mean Pressure 99 mmHg  Left Ventricular Apex Extended Systolic Pressure 456 mmHg  Left Ventricular Apex Extended Diastolic Pressure 15 mmHg  Left Ventricular Apex Extended EDP Pressure 31 mmHg  QP/QS 1  TPVR Index 9.74 HRUI  TSVR Index 39.35 HRUI  PVR SVR Ratio 0.11  TPVR/TSVR Ratio 0.25   STS RISK CALCULATOR Procedure: AV Replacement   Risk of Mortality: 3.007%  Morbidity or Mortality: 27.622%  Long Length of Stay: 11.997%  Short Length of Stay: 18.411%  Permanent Stroke: 2.011%  Prolonged Ventilation: 17.385%  DSW Infection: 0.653%  Renal Failure: 9.209%  Reoperation: 10.746%    ASSESSMENT AND PLAN: 83 yo male with severe symptomatic aortic stenosis, associated with severe reduction in LV function and chronic systolic heart failure with NYHA functional class 3 symptoms.   I have personally reviewed the patient's cath and echo images from his most recent studies. His aortic valve is severely calcified and restricted with severe global LV dysfunction. Coronary arteries are patent without obstructive disease. I have reviewed the natural history of aortic stenosis with the patient today. We have discussed the limitations of medical therapy and the poor prognosis associated with symptomatic aortic stenosis. We have reviewed potential treatment options, including palliative medical therapy, conventional surgical aortic valve replacement, and transcatheter aortic valve replacement. We discussed treatment options in the context of this patient's specific comorbid medical conditions. The patient's risk of conventional surgical AVR is moderately elevated based on advanced age, severe LV dysfunction, and stage 3 chronic kidney disease. TAVR might be a reasonable treatment alternative associated with a less morbid recovery in this elderly gentleman.   We have reviewed the necessary evaluation which would include a gated CT of the heart, CT angio of the chest/abdomen, and pelvis, PFT's, carotid duplex study, and cardiac surgical consultation. The patient understands and would like to proceed with his evaluation. I have written a letter to excuse him from work duties and active litigation until he can complete evaluation and treatment of his severe aortic stenosis.   I have discussed risks, expectations, indication, and typical recovery  from TAVR with the patient at length today.  Current medicines are reviewed with the patient today.  The patient does not have concerns regarding medicines.  Labs/ tests ordered today include:  No orders of the defined types were placed in this encounter.  Deatra James, MD  10/22/2016 2:21 PM    [facility] [place], [place], [place]  [address] Phone: [phone]; Fax: [phone]

## 2016-10-23 ENCOUNTER — Encounter: Payer: Self-pay | Admitting: Cardiovascular Disease

## 2016-10-23 LAB — BASIC METABOLIC PANEL
BUN/Creatinine Ratio: 18 (ref 10–24)
BUN: 24 mg/dL (ref 8–27)
CALCIUM: 9.5 mg/dL (ref 8.6–10.2)
CHLORIDE: 103 mmol/L (ref 96–106)
CO2: 26 mmol/L (ref 20–29)
Creatinine, Ser: 1.37 mg/dL — ABNORMAL HIGH (ref 0.76–1.27)
GFR calc non Af Amer: 48 mL/min/{1.73_m2} — ABNORMAL LOW (ref >59)
GFR, EST AFRICAN AMERICAN: 56 mL/min/{1.73_m2} — AB (ref >59)
Glucose: 104 mg/dL — ABNORMAL HIGH (ref 65–99)
Potassium: 4.1 mmol/L (ref 3.5–5.2)
SODIUM: 143 mmol/L (ref 134–144)

## 2016-11-02 ENCOUNTER — Encounter: Payer: Self-pay | Admitting: Interventional Cardiology

## 2016-11-02 ENCOUNTER — Telehealth: Payer: Self-pay

## 2016-11-02 NOTE — Telephone Encounter (Signed)
I left the pt a message to contact the office to give an update about work situation and discuss if he is ready to begin scheduling TAVR testing. The pt has a pending appointment with Dr [name] on 11/17/2016.

## 2016-11-05 ENCOUNTER — Encounter: Payer: Self-pay | Admitting: Cardiovascular Disease

## 2016-11-06 ENCOUNTER — Other Ambulatory Visit: Payer: Self-pay | Admitting: *Deleted

## 2016-11-06 DIAGNOSIS — I350 Nonrheumatic aortic (valve) stenosis: Secondary | ICD-10-CM

## 2016-11-13 ENCOUNTER — Ambulatory Visit (HOSPITAL_BASED_OUTPATIENT_CLINIC_OR_DEPARTMENT_OTHER)
Admission: RE | Admit: 2016-11-13 | Discharge: 2016-11-13 | Disposition: A | Discharge status: Home / Self Care | Payer: Medicare Other | Source: Ambulatory Visit | Attending: Cardiovascular Disease | Admitting: Cardiovascular Disease

## 2016-11-13 ENCOUNTER — Ambulatory Visit: Payer: Medicare Other | Attending: Family Medicine | Admitting: Physical Therapy

## 2016-11-13 ENCOUNTER — Encounter (HOSPITAL_COMMUNITY): Payer: Self-pay

## 2016-11-13 ENCOUNTER — Ambulatory Visit (HOSPITAL_COMMUNITY)
Admission: RE | Admit: 2016-11-13 | Discharge: 2016-11-13 | Disposition: A | Discharge status: Home / Self Care | Payer: Medicare Other | Source: Ambulatory Visit | Attending: Cardiovascular Disease | Admitting: Cardiovascular Disease

## 2016-11-13 ENCOUNTER — Ambulatory Visit (HOSPITAL_COMMUNITY): Payer: Medicare Other

## 2016-11-13 ENCOUNTER — Encounter: Payer: Self-pay | Admitting: Physical Therapy

## 2016-11-13 DIAGNOSIS — R2689 Other abnormalities of gait and mobility: Secondary | ICD-10-CM | POA: Diagnosis not present

## 2016-11-13 DIAGNOSIS — N281 Cyst of kidney, acquired: Secondary | ICD-10-CM | POA: Diagnosis not present

## 2016-11-13 DIAGNOSIS — R911 Solitary pulmonary nodule: Secondary | ICD-10-CM | POA: Insufficient documentation

## 2016-11-13 DIAGNOSIS — I350 Nonrheumatic aortic (valve) stenosis: Secondary | ICD-10-CM | POA: Diagnosis not present

## 2016-11-13 DIAGNOSIS — I6523 Occlusion and stenosis of bilateral carotid arteries: Secondary | ICD-10-CM | POA: Insufficient documentation

## 2016-11-13 DIAGNOSIS — I700 Atherosclerosis of aorta: Secondary | ICD-10-CM | POA: Diagnosis not present

## 2016-11-13 DIAGNOSIS — J432 Centrilobular emphysema: Secondary | ICD-10-CM | POA: Insufficient documentation

## 2016-11-13 DIAGNOSIS — K5730 Diverticulosis of large intestine without perforation or abscess without bleeding: Secondary | ICD-10-CM | POA: Insufficient documentation

## 2016-11-13 LAB — PULMONARY FUNCTION TEST
DL/VA % pred: 65 %
DL/VA: 3.08 ml/min/mmHg/L
DLCO UNC % PRED: 47 %
DLCO unc: 16.77 ml/min/mmHg
FEF 25-75 Post: 1.87 L/sec
FEF 25-75 Pre: 1.13 L/sec
FEF2575-%CHANGE-POST: 65 %
FEF2575-%PRED-POST: 85 %
FEF2575-%Pred-Pre: 51 %
FEV1-%CHANGE-POST: 10 %
FEV1-%PRED-PRE: 78 %
FEV1-%Pred-Post: 87 %
FEV1-POST: 2.52 L
FEV1-PRE: 2.27 L
FEV1FVC-%CHANGE-POST: 4 %
FEV1FVC-%Pred-Pre: 90 %
FEV6-%Change-Post: 7 %
FEV6-%PRED-PRE: 86 %
FEV6-%Pred-Post: 93 %
FEV6-PRE: 3.23 L
FEV6-Post: 3.48 L
FEV6FVC-%Change-Post: 2 %
FEV6FVC-%PRED-PRE: 101 %
FEV6FVC-%Pred-Post: 104 %
FVC-%CHANGE-POST: 5 %
FVC-%PRED-POST: 90 %
FVC-%Pred-Pre: 85 %
FVC-POST: 3.56 L
FVC-Pre: 3.37 L
POST FEV1/FVC RATIO: 71 %
PRE FEV6/FVC RATIO: 96 %
Post FEV6/FVC ratio: 98 %
Pre FEV1/FVC ratio: 67 %
RV % PRED: 109 %
RV: 3.06 L
TLC % pred: 88 %
TLC: 6.64 L

## 2016-11-13 MED ORDER — IOPAMIDOL (ISOVUE-370) INJECTION 76%
INTRAVENOUS | Status: AC
  Filled 2016-11-13: qty 50

## 2016-11-13 MED ORDER — ALBUTEROL SULFATE (2.5 MG/3ML) 0.083% IN NEBU
2.5000 mg | INHALATION_SOLUTION | Freq: Once | RESPIRATORY_TRACT | Status: AC
Start: 2016-11-13 — End: 2016-11-13
  Administered 2016-11-13: 2.5 mg via RESPIRATORY_TRACT

## 2016-11-13 MED ORDER — IOPAMIDOL (ISOVUE-370) INJECTION 76%
INTRAVENOUS | Status: AC
  Administered 2016-11-13: 80 mL
  Filled 2016-11-13: qty 100

## 2016-11-13 NOTE — Therapy (Signed)
[place], [place], [address] Phone: [phone]   Fax:  [phone]  Physical Therapy Evaluation  Patient Details  Name: Robert Horn MRN: [id number] Date of Birth: 1933-05-14 Referring Provider: Dr. Sherren Mocha  Encounter Date: 11/13/2016      PT End of Session - 11/13/16 1123    Visit Number 1   PT Start Time 6378   PT Stop Time 1103   PT Time Calculation (min) 38 min      Past Medical History:  Diagnosis Date  . Dyslipidemia 10/27/2015  . Elevated PSA 10/27/2015  . Erectile dysfunction 10/27/2015  . Hypertension 10/27/2015  . Hypogonadism male 10/27/2015  . Obesity 10/27/2015  . Pneumonia 10/27/2015  . Rotator cuff tear 10/27/2015    Past Surgical History:  Procedure Laterality Date  . CARDIAC CATHETERIZATION N/A 11/14/2015   Procedure: Left Heart Cath and Coronary Angiography;  Surgeon: [name], MD;  Location: [facility];  Service: Cardiovascular;  Laterality: N/A;    There were no vitals filed for this visit.       Subjective Assessment - 11/13/16 1030    Subjective Pt reports shortness of breath with walking faster paces, up inlcines and stairs. Pt attributes this to starting after a hospitalization last August 2017 when he was without BP meds x 10 days and ended up in the hospital for heart failure.    Patient Stated Goals to fix heart   Currently in Pain? No/denies            [facility] PT Assessment - 11/13/16 0001      Assessment   Medical Diagnosis severe aortic stenosis   Referring Provider Dr. Sherren Mocha   Onset Date/Surgical Date --  Aug 2017     Precautions   Precautions None     Restrictions   Weight Bearing Restrictions No     Balance Screen   Has the patient fallen in the past 6 months No   Has the patient had a decrease in activity level because of a fear of falling?  No   Is the patient reluctant to leave their home because of a fear  of falling?  No     Home Environment   Living Environment Private residence   Living Arrangements Spouse/significant other   Type of [place] to enter   Entrance Stairs-Number of Steps 4   Entrance Stairs-Rails Right   Home Layout Multi-level   Alternate Level Stairs-Number of Steps 12   Alternate Level Stairs-Rails Right     Prior Function   Level of Independence Independent with community mobility without device     Posture/Postural Control   Posture/Postural Control Postural limitations   Postural Limitations Rounded Shoulders;Forward head  mild     ROM / Strength   AROM / PROM / Strength AROM;Strength     AROM   Overall AROM Comments grossly WNL     Strength   Overall Strength Comments grossly 5/5   Strength Assessment Site Hand   Right/Left hand Right;Left   Right Hand Grip (lbs) 91  R hand dominant   Left Hand Grip (lbs) 80     Ambulation/Gait   Gait Comments No significant deviations noted.  Pt's pace slowed about 4 minutes into 6 minute waklk. Gait distance limited by 2% for age/gender.           [employer] Pre-Surgical Assessment - 11/13/16 0001    5 Meter  Walk Test- trial 1 5 sec   5 Meter Walk Test- trial 2 6 sec.    5 Meter Walk Test- trial 3 5 sec.   5 meter walk test average 5.33 sec   4 Stage Balance Test Position 4   comment 7   Sit To Stand Test- trial 1 14 sec.   ADL/IADL Independent with: Bathing;Dressing;Meal prep;Finances;Yard work   ADL/IADL Therapist, sports Index Vulnerable   6 Minute Walk- Baseline yes   BP (mmHg) 149/81   HR (bpm) (!)  44   02 Sat (%RA) 95 %   Modified Borg Scale for Dyspnea 0- Nothing at all   Perceived Rate of Exertion (Borg) 6-   6 Minute Walk Post Test yes   BP (mmHg) 149/67   HR (bpm) 81   02 Sat (%RA) 96 %   Modified Borg Scale for Dyspnea 0.5- Very, very slight shortness of breath   Perceived Rate of Exertion (Borg) 7- Very, very light   Aerobic Endurance Distance Walked 1335           Objective measurements completed on examination: See above findings.                               Plan - 11-25-16 1126    Clinical Impression Statement see below   PT Frequency One time visit     Clinical Impression Statement: Pt is an 83 yo ale presenting to OP PT for evaluation prior to possible TAVR surgery due to severe aortic stenosis. Pt reports onset of shortness of breath with exertion about a year ago after he was hospitalized after running out of his HTN meds x 10 days. Symptoms are requiring him to take more rests, move slower and he also reports a general fatigue. Pt presents with good ROM and strength, good balance and is not at high fall risk 4 stage balance test, good walking speed and good aerobic endurance per 6 minute walk test. Pt ambulated a total of 1335 feet in 6 minute walk. Based on the Short Physical Performance Battery, patient has a frailty rating of 9/12 with </= 5/12 considered frail.   Patient demonstrated the following deficits and impairments:     Visit Diagnosis: Other abnormalities of gait and mobility      G-Codes - November 25, 2016 1126    Functional Assessment Tool Used (Outpatient Only) 6 minute walk 1335'   Functional Limitation Mobility: Walking and moving around   Mobility: Walking and Moving Around Current Status [phone]) At least 1 percent but less than 20 percent impaired, limited or restricted   Mobility: Walking and Moving Around Goal Status [phone]) At least 1 percent but less than 20 percent impaired, limited or restricted   Mobility: Walking and Moving Around Discharge Status [phone]) At least 1 percent but less than 20 percent impaired, limited or restricted       Problem List Patient Active Problem List   Diagnosis Date Noted  . CKD (chronic kidney disease) stage 2, GFR 60-89 ml/min 10/29/2015  . Hypertensive heart disease with heart failure ([employer]) 07/26/2015  . Essential hypertension 05/09/2013  . Aortic  stenosis 05/09/2013  . Hyperlipidemia 05/09/2013  . PVC's (premature ventricular contractions) 05/09/2013  . Chronic combined systolic and diastolic heart failure ([place]) 05/09/2013    [place], PT Nov 25, 2016, 11:27 AM  [facility] [address] [place], [place], [address] Phone: [phone]   Fax:  [phone]  Name:  Robert Horn MRN: [id number] Date of Birth: [birth year]

## 2016-11-13 NOTE — Progress Notes (Addendum)
*  PRELIMINARY RESULTS* Vascular Ultrasound Carotid Duplex (Doppler) has been completed.   Findings suggest 1-39% internal carotid artery stenosis bilaterally. The left vertebral artery is patent with antegrade flow. Unable to visualize the right vertebral artery.  11/13/2016 3:04 PM Maudry Mayhew, BS, RVT, RDCS, RDMS

## 2016-11-15 LAB — VAS US CAROTID
LCCADSYS: -99 cm/s
LCCAPDIAS: 15 cm/s
LEFT ECA DIAS: -10 cm/s
LEFT VERTEBRAL DIAS: 8 cm/s
LICADDIAS: 28 cm/s
Left CCA dist dias: -18 cm/s
Left CCA prox sys: 97 cm/s
Left ICA dist sys: 104 cm/s
Left ICA prox dias: -15 cm/s
Left ICA prox sys: -49 cm/s
RCCADSYS: 72 cm/s
RIGHT ECA DIAS: 8 cm/s
Right CCA prox dias: 15 cm/s
Right CCA prox sys: 51 cm/s

## 2016-11-16 ENCOUNTER — Telehealth: Payer: Self-pay | Admitting: Interventional Cardiology

## 2016-11-16 NOTE — Telephone Encounter (Signed)
Spoke with wife and made her aware that Dr. [name] said pt did not need to keep appt for tomorrow since he is currently being worked up for TAVR.  She told pt and pt agreed to cancel tomorrows appt.

## 2016-11-16 NOTE — Telephone Encounter (Signed)
Mrs.Brandes is returning a call .Marland Kitchen Please call

## 2016-11-17 ENCOUNTER — Ambulatory Visit: Payer: Medicare Other | Admitting: Interventional Cardiology

## 2016-11-19 ENCOUNTER — Encounter: Payer: Self-pay | Admitting: Surgery

## 2016-11-19 ENCOUNTER — Institutional Professional Consult (permissible substitution) (INDEPENDENT_AMBULATORY_CARE_PROVIDER_SITE_OTHER): Payer: Medicare Other | Admitting: Surgery

## 2016-11-19 VITALS — BP 133/66 | HR 51 | Resp 16 | Ht 72.0 in | Wt 230.0 lb

## 2016-11-19 DIAGNOSIS — I5032 Chronic diastolic (congestive) heart failure: Secondary | ICD-10-CM | POA: Diagnosis not present

## 2016-11-19 DIAGNOSIS — I350 Nonrheumatic aortic (valve) stenosis: Secondary | ICD-10-CM | POA: Diagnosis not present

## 2016-11-20 ENCOUNTER — Encounter: Payer: Self-pay | Admitting: Surgery

## 2016-11-20 NOTE — Progress Notes (Signed)
Patient ID: Robert Horn, male   DOB: [birth year], 83 y.o.   MRN: [id number]  [facility] CONSULTATION REPORT  Referring Provider is [name], MD PCP is [name], MD  Chief Complaint  Patient presents with  . 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 [name] for moderate aortic stenosis and chronic diastolic heart failure. He has done well overall and continues to work but reports an episode about a year ago when he ran out of his antihypertensive medication for about 10 days and was hospitalized with acute heart failure. An echo at that time on 10/27/2015 showed an EF of 40-45%. The mean aortic valve gradient was 30 mm Hg with severely calcified and thickened leaflets. A cath on 11/14/2015 showed widely patent coronary arteries with an EF of 30-35% with elevated EDP of 31. The mean AV gradient was 18.2 mm Hg with an AVA calculated at 1.73 cm2. He has been continued on medical therapy for his hypertension and diastolic heart failure and says that he feels well but gets short of breath and fatigued with exertion so he tries not to do activities that cause symptoms like walking up hills. He has stairs in his house but goes up very slowly. He denies orthopnea and PND. He denies dizziness and syncope, has no chest pressure or pain but does have some ankle swelling. He recently had a repeat echo on 10/08/2016 that showed progression of his AS with a mean gradient of 32 mm Hg with a peak of 44 mm Hg. The peak velocity ratio is 0.23. The LVEF was severely reduced with an EF of 25-30% with doppler parameters consistent with restrictive physiology. The calculated AVR is 0.8 cm2.  Past Medical History:  Diagnosis Date  . Dyslipidemia  10/27/2015  . Elevated PSA 10/27/2015  . Erectile dysfunction 10/27/2015  . Hypertension 10/27/2015  . Hypogonadism male 10/27/2015  . Obesity 10/27/2015  . Pneumonia 10/27/2015  . Rotator cuff tear 10/27/2015    Past Surgical History:  Procedure Laterality Date  . CARDIAC CATHETERIZATION N/A 11/14/2015   Procedure: Left Heart Cath and Coronary Angiography;  Surgeon: [name], MD;  Location: [facility];  Service: Cardiovascular;  Laterality: N/A;    Family History  Problem Relation Age of Onset  . Diabetes Mother   . Heart disease Mother   . Pulmonary embolism Father     Social History   Social History  . Marital status: Single    Spouse name: N/A  . Number of children: N/A  . Years of education: N/A   Occupational History  . Not on file.   Social History Main Topics  . Smoking status: Former Smoker    Types: Cigarettes  . Smokeless tobacco: Never Used  . Alcohol use No  . Drug use: No  . Sexual activity: Not on file   Other Topics Concern  . Not on file   Social History Narrative  . No narrative on file    Current Outpatient Prescriptions  Medication Sig Dispense Refill  . amLODipine (NORVASC) 10 MG tablet Take 1 tablet (10 mg total) by mouth daily. 30 tablet 11  . aspirin (ASPIRIN EC) 81 MG EC tablet Take 81 mg by mouth daily. Swallow  whole.    . carvedilol (COREG) 6.25 MG tablet Take 1 tablet (6.25 mg total) by mouth 2 (two) times daily. 180 tablet 3  . furosemide (LASIX) 40 MG tablet TAKE 1 TABLET BY MOUTH  DAILY 90 tablet 3  . hydrALAZINE (APRESOLINE) 25 MG tablet TAKE 1 TABLET BY MOUTH EVERY 8 HOURS 90 tablet 11  . Multiple Vitamin (MULTIVITAMIN WITH MINERALS) TABS tablet Take 1 tablet by mouth daily. 30 tablet 1  . olmesartan (BENICAR) 40 MG tablet TAKE 1 TABLET BY MOUTH  DAILY 90 tablet 3  . omega-3 acid ethyl esters (LOVAZA) 1 g capsule TAKE ONE CAPSULE BY MOUTH EVERY DAY 30 capsule 11  . VITAMIN B COMPLEX-C PO Take 1 tablet by mouth  daily.     No current facility-administered medications for this visit.     No Known Allergies    Review of Systems:   General:  normal appetite, decreased energy with activity, no weight gain, no weight loss, no fever  Cardiac:  no chest pain with exertion, no chest pain at rest, has SOB with mild exertion, no resting SOB, no PND, no orthopnea, no palpitations, no arrhythmia, no atrial fibrillation, has LE edema, no dizzy spells, no syncope  Respiratory:  exertional shortness of breath, no home oxygen, no productive cough, has dry cough, no bronchitis, has wheezing, no hemoptysis, no asthma, no pain with inspiration or cough, no sleep apnea, no CPAP at night  GI:   no difficulty swallowing, no reflux, no frequent heartburn, no hiatal hernia, no abdominal pain, no constipation, no diarrhea, no hematochezia, no hematemesis, no melena  GU:   no dysuria,  no frequency, no urinary tract infection, no hematuria, has enlarged prostate, no kidney stones, no kidney disease  Vascular:  no pain suggestive of claudication, has pain in feet, no leg cramps, has varicose veins, no DVT, no non-healing foot ulcer  Neuro:   no stroke, no TIA's, no seizures, no headaches, no temporary blindness one eye,  no slurred speech, has peripheral neuropathy, no chronic pain, no instability of gait, no memory/cognitive dysfunction  Musculoskeletal: no arthritis, no joint swelling, no myalgias, no difficulty walking, normal mobility   Skin:   no rash, no itching, no skin infections, no pressure sores or ulcerations  Psych:   no anxiety, no depression, no nervousness, no unusual recent stress  Eyes:   no blurry vision, no floaters, no recent vision changes,  wears glasses   ENT:   no hearing loss, no loose or painful teeth, has dentures  Hematologic:  no easy bruising, no abnormal bleeding, no clotting disorder, no frequent epistaxis  Endocrine:  no diabetes, does not check CBG's at home      Physical Exam:   BP  133/66 (BP Location: Right Arm, Patient Position: Sitting, Cuff Size: Large)   Pulse (!) 51   Resp 16   Ht 6' (1.829 m)   Wt 230 lb (104.3 kg)   SpO2 96% Comment: ON RA  BMI 31.19 kg/m   General:  Elderly but well-appearing  HEENT:  Unremarkable, NCAT, PERLA, EOMI, oropharynx clear  Neck:   no JVD, no bruits, no adenopathy or thyromegaly  Chest:   clear to auscultation, symmetrical breath sounds, no wheezes, no rhonchi   CV:   RRR, grade III/VI crescendo/decrescendo murmur heard best at RSB,  no diastolic murmur  Abdomen:  soft, non-tender, no masses or organomegaly  Extremities:  warm, well-perfused, pulses palpable in feet, no LE edema  Rectal/GU  Deferred  Neuro:   Grossly non-focal and symmetrical throughout  Skin:   Clean and dry, no rashes, no breakdown   Diagnostic Tests:    [facility]                        [address], [place] [address]                            [phone]  ------------------------------------------------------------------- Transthoracic Echocardiography  Patient:    Jakobie, Henslee MR #:       [id number] Study Date: 10/08/2016 Gender:     M Age:        74 Height:     182.9 cm Weight:     107.7 kg BSA:        2.37 m^2 Pt. Status: Room:   Ivar Bury, MD  [place], MD  ATTENDING    Kirk Ruths  SONOGRAPHER  Cindy Hazy, RDCS  PERFORMING   [facility], Outpatient  cc:  ------------------------------------------------------------------- LV EF: 25% -   30%  ------------------------------------------------------------------- Indications:      I50.9 Congestive Heart Failure.  ------------------------------------------------------------------- History:   PMH:  Acquired from the patient and from the patient&'s chart.  PMH:  Pneumonia.  Risk factors:  Hypertension.  Obese. Dyslipidemia.  ------------------------------------------------------------------- Study Conclusions  - Left ventricle: The cavity size was mildly dilated. Wall   thickness was normal. Systolic function was severely reduced. The   estimated ejection fraction was in the range of 25% to 30%.   Diffuse hypokinesis. Doppler parameters are consistent with   restrictive physiology, indicative of decreased left ventricular   diastolic compliance and/or increased left atrial pressure.   Doppler parameters are consistent with high ventricular filling   pressure. - Aortic valve: Valve mobility was restricted. There was severe   stenosis. There was trivial regurgitation. - Ascending aorta: The ascending aorta was mildly dilated. - Left atrium: The atrium was severely dilated. - Right ventricle: The cavity size was mildly dilated. - Right atrium: The atrium was mildly dilated. - Pulmonary arteries: Systolic pressure was mildly increased.  Impressions:  - Severe global reduction in LV systolic function; mild LVE;   restrictive filling; calcified aortic valve with severe AS (mean   gradient 32 mmHg; AVA 0.8 cm2); trace AI; mildly dilated   ascending aorta; severe LAE; mild RAE/RVE; mildly elevated   pulmonary pressure.  ------------------------------------------------------------------- Study data:   Study status:  Routine.  Procedure:  The patient reported no pain pre or post test. Transthoracic echocardiography for left ventricular function evaluation, for right ventricular function evaluation, and for assessment of valvular function. Image quality was adequate.  Study completion:  There were no complications.          Transthoracic echocardiography.  M-mode, complete 2D, spectral Doppler, and color Doppler.  Birthdate: Patient birthdate: [birth year].  Age:  Patient is 83 yr old.  Sex: Gender: male.    BMI: 32.2 kg/m^2.  Blood pressure:     124/66 Patient status:  Outpatient.   Study date:  Study date: 10/08/2016. Study time: 02:55 PM.  Location:  [facility]  -------------------------------------------------------------------  ------------------------------------------------------------------- Left ventricle:  The cavity size was mildly dilated. Wall thickness was normal. Systolic function was  severely reduced. The estimated ejection fraction was in the range of 25% to 30%. Diffuse hypokinesis. Doppler parameters are consistent with restrictive physiology, indicative of decreased left ventricular diastolic compliance and/or increased left atrial pressure. Doppler parameters are consistent with high ventricular filling pressure.   ------------------------------------------------------------------- Aortic valve:   Trileaflet; severely calcified leaflets. Valve mobility was restricted.  Doppler:   There was severe stenosis. There was trivial regurgitation.    VTI ratio of LVOT to aortic valve: 0.22. Valve area (VTI): 0.82 cm^2. Indexed valve area (VTI): 0.35 cm^2/m^2. Peak velocity ratio of LVOT to aortic valve: 0.23. Valve area (Vmax): 0.89 cm^2. Indexed valve area (Vmax): 0.38 cm^2/m^2. Mean velocity ratio of LVOT to aortic valve: 0.21. Valve area (Vmean): 0.79 cm^2. Indexed valve area (Vmean): 0.33 cm^2/m^2.    Mean gradient (S): 32 mm Hg. Peak gradient (S): 44 mm Hg.  ------------------------------------------------------------------- Aorta:  Aortic root: The aortic root was normal in size. Ascending aorta: The ascending aorta was mildly dilated.  ------------------------------------------------------------------- Mitral valve:   Structurally normal valve.   Mobility was not restricted.  Doppler:  Transvalvular velocity was within the normal range. There was no evidence for stenosis. There was trivial regurgitation.    Peak gradient (D): 4 mm Hg.  ------------------------------------------------------------------- Left atrium:  The atrium  was severely dilated.  ------------------------------------------------------------------- Right ventricle:  The cavity size was mildly dilated. Systolic function was normal.  ------------------------------------------------------------------- Pulmonic valve:    Doppler:  Transvalvular velocity was within the normal range. There was no evidence for stenosis. There was trivial regurgitation.  ------------------------------------------------------------------- Tricuspid valve:   Structurally normal valve.    Doppler: Transvalvular velocity was within the normal range. There was trivial regurgitation.  ------------------------------------------------------------------- Pulmonary artery:   Systolic pressure was mildly increased.  ------------------------------------------------------------------- Right atrium:  The atrium was mildly dilated.  ------------------------------------------------------------------- Pericardium:  There was no pericardial effusion.  ------------------------------------------------------------------- Systemic veins: Inferior vena cava: The vessel was normal in size.  ------------------------------------------------------------------- Measurements   Left ventricle                            Value          Reference  LV ID, ED, PLAX chordal           (H)     57.8  mm       43 - 52  LV ID, ES, PLAX chordal           (H)     47.5  mm       23 - 38  LV fx shortening, PLAX chordal    (L)     18    %        >=29  LV PW thickness, ED                       9.24  mm       ---------  IVS/LV PW ratio, ED                       0.84           <=1.3  Stroke volume, 2D                         69    ml       ---------  Stroke volume/bsa, 2D  29    ml/m^2   ---------  LV e&', lateral                            4.3   cm/s     ---------  LV E/e&', lateral                          22.6           ---------  LV e&', medial                              3.95  cm/s     ---------  LV E/e&', medial                           24.61          ---------  LV e&', average                            4.13  cm/s     ---------  LV E/e&', average                          23.56          ---------    Ventricular septum                        Value          Reference  IVS thickness, ED                         7.74  mm       ---------    LVOT                                      Value          Reference  LVOT ID, S                                22    mm       ---------  LVOT area                                 3.8   cm^2     ---------  LVOT ID                                   22    mm       ---------  LVOT peak velocity, S                     77.2  cm/s     ---------  LVOT mean velocity, S                     53.8  cm/s     ---------  LVOT VTI, S  18.2  cm       ---------  LVOT peak gradient, S                     2     mm Hg    ---------  Stroke volume (SV), LVOT DP               69.2  ml       ---------  Stroke index (SV/bsa), LVOT DP            29.2  ml/m^2   ---------    Aortic valve                              Value          Reference  Aortic valve peak velocity, S             330   cm/s     ---------  Aortic valve mean velocity, S             258   cm/s     ---------  Aortic valve VTI, S                       84.4  cm       ---------  Aortic mean gradient, S                   32    mm Hg    ---------  Aortic peak gradient, S                   44    mm Hg    ---------  VTI ratio, LVOT/AV                        0.22           ---------  Aortic valve area, VTI                    0.82  cm^2     ---------  Aortic valve area/bsa, VTI                0.35  cm^2/m^2 ---------  Velocity ratio, peak, LVOT/AV             0.23           ---------  Aortic valve area, peak velocity          0.89  cm^2     ---------  Aortic valve area/bsa, peak               0.38  cm^2/m^2 ---------  velocity  Velocity ratio, mean, LVOT/AV              0.21           ---------  Aortic valve area, mean velocity          0.79  cm^2     ---------  Aortic valve area/bsa, mean               0.33  cm^2/m^2 ---------  velocity  Aortic regurg pressure half-time          617   ms       ---------    Aorta  Value          Reference  Aortic root ID, ED                        33    mm       ---------  Ascending aorta ID, A-P, S                38    mm       ---------    Left atrium                               Value          Reference  LA ID, A-P, ES                            56    mm       ---------  LA ID/bsa, A-P                    (H)     2.36  cm/m^2   <=2.2  LA volume, S                              132   ml       ---------  LA volume/bsa, S                          55.7  ml/m^2   ---------  LA volume, ES, 1-p A4C                    117   ml       ---------  LA volume/bsa, ES, 1-p A4C                49.4  ml/m^2   ---------  LA volume, ES, 1-p A2C                    145   ml       ---------  LA volume/bsa, ES, 1-p A2C                61.2  ml/m^2   ---------    Mitral valve                              Value          Reference  Mitral E-wave peak velocity               97.2  cm/s     ---------  Mitral A-wave peak velocity               44.4  cm/s     ---------  Mitral deceleration time                  162   ms       150 - 230  Mitral peak gradient, D                   4     mm Hg    ---------  Mitral E/A ratio, peak  2.2            ---------    Tricuspid valve                           Value          Reference  Tricuspid regurg peak velocity            286   cm/s     ---------  Tricuspid peak RV-RA gradient             33    mm Hg    ---------    Right ventricle                           Value          Reference  RV s&', lateral, S                         13.2  cm/s     ---------  Legend: (L)  and  (H)  mark values outside specified reference  range.  ------------------------------------------------------------------- Prepared and Electronically Authenticated by  Kirk Ruths 2018-07-12T16:58:45  Physicians   Panel Physicians Referring Physician Case Authorizing Physician  [name], MD (Primary)    Procedures   Left Heart Cath and Coronary Angiography  Conclusion     There is moderate to severe left ventricular systolic dysfunction.  LV end diastolic pressure is moderately elevated.  The left ventricular ejection fraction is 35-45% by visual estimate.  There is moderate aortic valve stenosis. There is trivial (1+) aortic regurgitation.    Calcific aortic valve disease with mild aortic regurgitation and mild to moderate aortic stenosis.  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  Widely patent normal appearing coronary arteries.  Estimated left ventricular ejection fraction is 30-35% with elevated end-diastolic pressure and global hypokinesis. Findings are compatible with nonischemic cardiomyopathy  Mild pulmonary hypertension  RECOMMENDATIONS:   Optimize medical therapy   Indications   Chronic systolic HF (heart failure) (HCC) [I50.22 (ICD-10-CM)]  AVD (aortic valve disease) [I35.9 (ICD-10-CM)]  Essential hypertension [I10 (ICD-10-CM)]  Procedural Details/Technique   Technical Details The right radial area was sterilely prepped and draped. Intravenous sedation with Versed and fentanyl was administered. 1% Xylocaine was infiltrated to achieve local analgesia. A double wall stick with an angiocath was utilized to obtain intra-arterial access. The modified Seldinger technique was used to place a 24F " Slender" sheath in the right radial artery. Weight based heparin was administered. Coronary angiography was done using 5 F catheters. Right coronary angiography was performed with a JR4. Left ventricular hemodymic recordings and angiography was done using the JR 4  catheter and hand injection. Left coronary angiography was performed with a JL 3.5 cm. The right coronary artery could not be selectively engaged with the [place]. We also had difficulty selectively engaging the left coronary with the JL 3.5 cm catheter. We will ultimately able to perform angiography on each coronary using a 3.5 cm 5 [place] EBU catheter.  Supravalvular aortography was performed using a straight pigtail catheter at 18 cc/s for a total of 40 cc of contrast. This was performed with power injection.  Right heart catheterization was performed by exchanging an antecubital angiocath IV for a 5 French brachial sheath. 1% Xylocaine local infiltration at the IV site was given prior to sheath exchange. A double glove  technique was used. The modified Seldinger technique was employed. After sheath insertion, right heart cath was performed using a 5 French balloon tipped catheter. Pressures were recorded in each chamber. A main pulmonary artery O2 saturation was obtained. I wedge pressure was also measured.   Hemostasis was achieved using a pneumatic band.  During this procedure the patient is administered a total of Versed 2 mg and Fentanyl 50 mg to achieve and maintain moderate conscious sedation. The patient's heart rate, blood pressure, and oxygen saturation are monitored continuously during the procedure. The period of conscious sedation is 37 minutes, of which I was present face-to-face 100% of this time.   Estimated blood loss <50 mL. . During this procedure the patient was administered the following to achieve and maintain moderate conscious sedation: Versed 2 mg, Fentanyl 50 mcg, while the patient's heart rate, blood pressure, and oxygen saturation were continuously monitored. The period of conscious sedation was 37 minutes, of which I was present face-to-face 100% of this time.    Coronary Findings   Dominance: Co-dominant  Left Anterior Descending  Third Diagonal Branch  Vessel is small  in size.  Wall Motion   Resting               Left Heart   Left Ventricle The left ventricle is dilated. There is moderate to severe left ventricular systolic dysfunction. LV end diastolic pressure is moderately elevated. The left ventricular ejection fraction is 35-45% by visual estimate. There are LV function abnormalities due to global hypokinesis.    Aortic Valve There is moderate aortic valve stenosis. There is trivial (1+) aortic regurgitation. The aortic valve is calcified. There is restricted aortic valve motion.    Coronary Diagrams   Diagnostic Diagram       Implants     No implant documentation for this case.  PACS Images   Show images for Cardiac catheterization   Link to Procedure Log   Procedure Log    Hemo Data    Most Recent Value  Fick Cardiac Output 5.85 L/min  Fick Cardiac Output Index 2.57 (L/min)/BSA  Aortic Mean Gradient 18.2 mmHg  Aortic Peak Gradient 15 mmHg  Aortic Valve Area 1.73  Aortic Value Area Index 0.76 cm2/BSA  RA A Wave 11 mmHg  RA V Wave 10 mmHg  RA Mean 7 mmHg  RV Systolic Pressure 45 mmHg  RV Diastolic Pressure 3 mmHg  RV EDP 10 mmHg  PA Systolic Pressure 43 mmHg  PA Diastolic Pressure 14 mmHg  PA Mean 25 mmHg  PW A Wave 21 mmHg  PW V Wave 18 mmHg  PW Mean 15 mmHg  AO Systolic Pressure 956 mmHg  AO Diastolic Pressure 74 mmHg  AO Mean 387 mmHg  LV Systolic Pressure 564 mmHg  LV Diastolic Pressure 13 mmHg  LV EDP 28 mmHg  Arterial Occlusion Pressure Extended Systolic Pressure 332 mmHg  Arterial Occlusion Pressure Extended Diastolic Pressure 72 mmHg  Arterial Occlusion Pressure Extended Mean Pressure 99 mmHg  Left Ventricular Apex Extended Systolic Pressure 951 mmHg  Left Ventricular Apex Extended Diastolic Pressure 15 mmHg  Left Ventricular Apex Extended EDP Pressure 31 mmHg  QP/QS 1  TPVR Index 9.74 HRUI  TSVR Index 39.35 HRUI  PVR SVR Ratio 0.11  TPVR/TSVR Ratio 0.25   CT CORONARY MORPH W/CTA COR W/SCORE W/CA  W/CM &/OR WO/CM (Accession [id number]) (Order [id number])  Imaging  Date: 11/13/2016 Department: [facility] CT IMAGING Released By: Jillyn Hidden Authorizing: Sherren Mocha, MD  Exam Information   Status Exam Begun  Exam Ended   Final [99] 11/13/2016 1:21 PM 11/13/2016 2:14 PM  PACS Images   Show images for CT CORONARY MORPH W/CTA COR W/SCORE W/CA W/CM &/OR WO/CM  Addendum   ADDENDUM REPORT: 11/13/2016 16:36  CLINICAL DATA:  Aortic stenosis  EXAM: Cardiac TAVR CT  TECHNIQUE: The patient was scanned on a Siemens [id number] scanner. A 120 kV retrospective scan was triggered in the ascending thoracic aorta at 140 HU's. Gantry rotation speed was 250 msecs and collimation was.6 mm. No beta blockade or nitro were given. The 3D data set was reconstructed in 5% intervals of the R-R cycle. Systolic and diastolic phases were analyzed on a dedicated work station using MPR, MIP and VRT modes. The patient received 80 cc of contrast.  FINDINGS: Aortic Valve:  Tri leaflet and calcified with restricted motion  Aorta: Mild calcific atherosclerotic debris with no aneurysm and no coarctation  Sinotubular Junction:  31 mm  Ascending Thoracic Aorta:  36 mm  Aortic Arch:  29 mm  Descending Thoracic Aorta:  28 mm  Sinus of Valsalva Measurements:  Non-coronary:  31 mm  Right -coronary:  30 mm  Left -coronary:  31 mm  Coronary Artery Height above Annulus:  Left Main:  16.4 mm above annulus  Right Coronary:  16.4 mm above annulus  Virtual Basal Annulus Measurements:  Maximum/Minimum Diameter:  27.2 mm x 21.3 mm  Perimeter:  76.5 mm  Area:  449 mm2  Coronary Arteries:  Sufficient height above annulus for deployment  Optimum Fluoroscopic Angle for Delivery: LAO 34 degrees Cranial 22 degrees  IMPRESSION: 1) Calcified tri leaflet aortic valve with annulus 449 mm2 suitable for a 26 mm Sapien 3 valve  2) Optimum angle for deployment LAO  34 degrees Cranial 22 degrees  3) Coronary arteries sufficient height above annulus for deployment. 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   On: 11/13/2016 16:36       CT Angio Abd/Pel w/ and/or w/o (Accession [id number]) (Order [id number])  Imaging  Date: 11/13/2016 Department: [facility] CT IMAGING Released By: Selmer Dominion Authorizing: Sherren Mocha, MD  Exam Information   Status Exam Begun  Exam Ended   Final [99] 11/13/2016 2:55 PM 11/13/2016 3:17 PM  PACS Images   Show images for CT Angio Abd/Pel w/ and/or w/o  Study Result   CLINICAL DATA:  83 year old male with history of severe aortic stenosis. Preprocedural study prior to potential transcatheter aortic valve replacement (TAVR) procedure.  EXAM: CT ANGIOGRAPHY CHEST, ABDOMEN AND PELVIS  TECHNIQUE: Multidetector CT imaging through the chest, abdomen and pelvis was performed using the standard protocol during bolus administration of intravenous contrast. Multiplanar reconstructed images and MIPs were obtained and reviewed to evaluate the vascular anatomy.  CONTRAST:  100 mL of Isovue 370.  COMPARISON:  No priors.  FINDINGS: CTA CHEST FINDINGS  Cardiovascular: Heart size is enlarged with mild left ventricular dilatation and moderate left atrial dilatation. There is no significant pericardial fluid, thickening or pericardial calcification. Aortic atherosclerosis. No definite coronary artery calcifications. Thickening calcification of the aortic valve.  Mediastinum/Lymph Nodes: No pathologically enlarged mediastinal or hilar lymph nodes. Esophagus is unremarkable in appearance. No axillary lymphadenopathy.  Lungs/Pleura: Diffuse bronchial wall thickening with  mild centrilobular and paraseptal emphysema. 4 mm nodule in the apex of the left upper lobe (axial image 15 of series 10). No other larger more suspicious appearing pulmonary nodules or masses are otherwise noted. No acute consolidative airspace disease. No pleural effusions. Scarring and architectural distortion in the inferior aspect of the right upper lobe.  Musculoskeletal/Soft Tissues: There are no aggressive appearing lytic or blastic lesions noted in the visualized portions of the skeleton.  CTA ABDOMEN AND PELVIS FINDINGS  Hepatobiliary: No cystic or solid hepatic lesions. No intra or extrahepatic biliary ductal dilatation. Gallbladder is normal in appearance.  Pancreas: No pancreatic mass. No pancreatic ductal dilatation. No pancreatic or peripancreatic fluid or inflammatory changes.  Spleen: Unremarkable.  Adrenals/Urinary Tract: Bilateral adrenal glands are normal in appearance. Multiple low-attenuation lesions in both kidneys, compatible with simple cysts, the largest which measures 6.7 cm in the lower pole the right kidney. No hydroureteronephrosis. Urinary bladder is unremarkable in appearance.  Stomach/Bowel: Normal appearance of the stomach. No pathologic dilatation of small bowel or colon. A few scattered colonic diverticulae are noted, without surrounding inflammatory changes to suggest an acute diverticulitis at this time. Normal appendix.  Vascular/Lymphatic: Aortic atherosclerosis, with vascular findings and measurements pertinent to potential TAVR procedure, as detailed below. No aneurysm or dissection noted in the abdominal or pelvic vasculature. Common origin of celiac axis and superior mesenteric artery (so-called celiacomesenteric trunk, a normal anatomical variant). This common trunk and the inferior mesenteric artery are patent without hemodynamically significant stenosis. Single right renal artery and 2 left renal artery is are patent  without hemodynamically significant stenosis. No lymphadenopathy noted in the abdomen or pelvis.  Reproductive: Prostate gland is massively enlarged with severe median lobe hypertrophy measuring 5.7 x 6.5 x 6.5 cm. Seminal vesicles are unremarkable in appearance.  Other: No significant volume of ascites.  No pneumoperitoneum.  Musculoskeletal: There are no aggressive appearing lytic or blastic lesions noted in the visualized portions of the skeleton.  VASCULAR MEASUREMENTS PERTINENT TO TAVR:  AORTA:  Minimal Aortic Diameter -  20 x 18 mm  Severity of Aortic Calcification -  mild  RIGHT PELVIS:  Right Common Iliac Artery -  Minimal Diameter - 9.9 x 9.5 mm  Tortuosity - mild  Calcification - mild  Right External Iliac Artery -  Minimal Diameter - 8.0 x 7.7 mm  Tortuosity - severe  Calcification - mild  Right Common Femoral Artery -  Minimal Diameter - 9.3 x 6.4 mm  Tortuosity - mild  Calcification - mild  LEFT PELVIS:  Left Common Iliac Artery -  Minimal Diameter - 10.7 x 10.9 mm  Tortuosity - mild  Calcification - mild  Left External Iliac Artery -  Minimal Diameter - 6.5 x 6.0 mm  Tortuosity - severe  Calcification - mild  Left Common Femoral Artery -  Minimal Diameter - 7.7 x 7.5 mm  Tortuosity - mild  Calcification - mild  Review of the MIP images confirms the above findings.  IMPRESSION: 1. Vascular findings and measurements pertinent to potential TAVR procedure, as detailed above. This patient does have suitable pelvic arterial access bilaterally. 2. Severe thickening calcification of the aortic valve, compatible with the reported clinical history of aortic stenosis. 3. Mild diffuse bronchial wall thickening with mild centrilobular and paraseptal emphysema; imaging findings suggestive of underlying COPD. 4. 4 mm nodule in the apex of the left upper lobe (axial image 14 of series 10).  Non-contrast chest CT can be considered in 12 months if patient is  high-risk. This recommendation follows the consensus statement: Guidelines for Management of Incidental Pulmonary Nodules Detected on CT Images: From the Fleischner Society 2017; Radiology 2017; 284:228-243. 5. Cardiomegaly with left ventricular and left atrial dilatation. 6. Multiple simple cysts in the kidneys bilaterally. 7. Mild colonic diverticulosis without evidence of acute diverticulitis at this time.  Aortic Atherosclerosis (ICD10-I70.0) and Emphysema (ICD10-J43.9).   Electronically Signed   By: Vinnie Langton M.D.   On: 11/13/2016 16:31   STS RISK CALCULATOR Procedure: AV Replacement  Risk of Mortality: 3.007%  Morbidity or Mortality: 27.622%  Long Length of Stay: 11.997%  Short Length of Stay: 18.411%  Permanent Stroke: 2.011%  Prolonged Ventilation: 17.385%  DSW Infection: 0.653%  Renal Failure: 9.209%  Reoperation: 10.746%   Impression:  This 83 year old gentleman has stage D, severe, symptomatic aortic stenosis with NYHA class III symptoms of exertional shortness of breath and fatigue. He and his wife say that he has reduced his physical activity to avoid symptoms. His LVEF has deteriorated over the past year from 40-45% last July to 25-30% now. His gradient has increased despite the reduction in his EF. I have personally reviewed his echo, cath and CTA studies. His aortic valve is trileaflet with severely calcified and thickened leaflets with reduced mobility. His mean gradient is 32 mm Hg with a DI of 0.23 and an indexed valve area of 0.33 cm2/m2. He has no coronary disease. I agree that AVR is indicated to improve his exertional tolerance and to prevent further deterioration in his LV function. I think he would be at moderate risk for open surgical AVR due to his age and LV function. I think TAVR would be a better option for him with less risk and a much quicker recovery. His gated cardiac CT shows  anatomy favorable for TAVR using a 26 mm Sapien 3 valve. His abdominal and pelvic CT shows pelvic vasculature suitable for transfemoral insertion.  The patient and his wife were counseled at length regarding treatment alternatives for management of severe symptomatic aortic stenosis. The risks and benefits of surgical intervention has been discussed in detail. Long-term prognosis with medical therapy was discussed. Alternative approaches such as conventional surgical aortic valve replacement, transcatheter aortic valve replacement, and palliative medical therapy were compared and contrasted at length. This discussion was placed in the context of the patient's own specific clinical presentation and past medical history. All of their questions been addressed.   Following the decision to proceed with transcatheter aortic valve replacement, a discussion was held regarding what types of management strategies would be attempted intraoperatively in the event of life-threatening complications, including whether or not the patient would be considered a candidate for the use of cardiopulmonary bypass and/or conversion to open sternotomy for attempted surgical intervention. The patient has been advised of a variety of complications that might develop including but not limited to risks of death, stroke, paravalvular leak, aortic dissection or other major vascular complications, aortic annulus rupture, device embolization, cardiac rupture or perforation, mitral regurgitation, acute myocardial infarction, arrhythmia, heart block or bradycardia requiring permanent pacemaker placement, congestive heart failure, respiratory failure, renal failure, pneumonia, infection, other late complications related to structural valve deterioration or migration, or other complications that might ultimately cause a temporary or permanent loss of functional independence or other long term morbidity. The patient provides full informed consent  for the procedure as described and all questions were answered.     Plan:  He is going to check his work schedule and will  call us in the next couple days to schedule TAVR. He will return for a second surgical opinion and understands that proceeding with TAVR will depend on that evaluation and discussion with the multidisciplinary heart valve team.   I spent 60 minutes performing this consultation and > 50% of this time was spent face to face counseling and coordinating the care of this patient's severe aortic stenosis.    Gaye Pollack, MD 11/19/2016

## 2016-11-23 ENCOUNTER — Other Ambulatory Visit: Payer: Self-pay | Admitting: *Deleted

## 2016-11-23 ENCOUNTER — Other Ambulatory Visit: Payer: Self-pay

## 2016-11-23 DIAGNOSIS — I350 Nonrheumatic aortic (valve) stenosis: Secondary | ICD-10-CM

## 2016-12-09 ENCOUNTER — Institutional Professional Consult (permissible substitution) (INDEPENDENT_AMBULATORY_CARE_PROVIDER_SITE_OTHER): Payer: Medicare Other | Admitting: Thoracic Surgery (Cardiothoracic Vascular Surgery)

## 2016-12-09 ENCOUNTER — Encounter (HOSPITAL_COMMUNITY)
Admission: RE | Admit: 2016-12-09 | Discharge: 2016-12-09 | Disposition: A | Discharge status: Home / Self Care | Payer: Medicare Other | Source: Ambulatory Visit | Attending: Cardiovascular Disease | Admitting: Cardiovascular Disease

## 2016-12-09 ENCOUNTER — Encounter: Payer: Self-pay | Admitting: Thoracic Surgery (Cardiothoracic Vascular Surgery)

## 2016-12-09 ENCOUNTER — Encounter (HOSPITAL_COMMUNITY): Payer: Self-pay

## 2016-12-09 VITALS — BP 130/70 | HR 62 | Resp 20 | Ht 72.0 in | Wt 234.0 lb

## 2016-12-09 DIAGNOSIS — Z79899 Other long term (current) drug therapy: Secondary | ICD-10-CM | POA: Diagnosis not present

## 2016-12-09 DIAGNOSIS — Z87891 Personal history of nicotine dependence: Secondary | ICD-10-CM | POA: Insufficient documentation

## 2016-12-09 DIAGNOSIS — I5042 Chronic combined systolic (congestive) and diastolic (congestive) heart failure: Secondary | ICD-10-CM | POA: Insufficient documentation

## 2016-12-09 DIAGNOSIS — J9811 Atelectasis: Secondary | ICD-10-CM | POA: Diagnosis not present

## 2016-12-09 DIAGNOSIS — N182 Chronic kidney disease, stage 2 (mild): Secondary | ICD-10-CM | POA: Insufficient documentation

## 2016-12-09 DIAGNOSIS — I130 Hypertensive heart and chronic kidney disease with heart failure and stage 1 through stage 4 chronic kidney disease, or unspecified chronic kidney disease: Secondary | ICD-10-CM | POA: Diagnosis not present

## 2016-12-09 DIAGNOSIS — I700 Atherosclerosis of aorta: Secondary | ICD-10-CM | POA: Insufficient documentation

## 2016-12-09 DIAGNOSIS — E785 Hyperlipidemia, unspecified: Secondary | ICD-10-CM | POA: Diagnosis not present

## 2016-12-09 DIAGNOSIS — Z01810 Encounter for preprocedural cardiovascular examination: Secondary | ICD-10-CM | POA: Diagnosis not present

## 2016-12-09 DIAGNOSIS — Z7982 Long term (current) use of aspirin: Secondary | ICD-10-CM | POA: Diagnosis not present

## 2016-12-09 DIAGNOSIS — I350 Nonrheumatic aortic (valve) stenosis: Secondary | ICD-10-CM

## 2016-12-09 DIAGNOSIS — Z01812 Encounter for preprocedural laboratory examination: Secondary | ICD-10-CM | POA: Insufficient documentation

## 2016-12-09 DIAGNOSIS — Z01818 Encounter for other preprocedural examination: Secondary | ICD-10-CM | POA: Insufficient documentation

## 2016-12-09 HISTORY — DX: Cardiac murmur, unspecified: R01.1

## 2016-12-09 HISTORY — DX: Dyspnea, unspecified: R06.00

## 2016-12-09 HISTORY — DX: Heart failure, unspecified: I50.9

## 2016-12-09 LAB — URINALYSIS, ROUTINE W REFLEX MICROSCOPIC
Bacteria, UA: NONE SEEN
Bilirubin Urine: NEGATIVE
GLUCOSE, UA: NEGATIVE mg/dL
HGB URINE DIPSTICK: NEGATIVE
Ketones, ur: NEGATIVE mg/dL
NITRITE: NEGATIVE
PROTEIN: NEGATIVE mg/dL
Specific Gravity, Urine: 1.017 (ref 1.005–1.030)
Squamous Epithelial / LPF: NONE SEEN
pH: 5 (ref 5.0–8.0)

## 2016-12-09 LAB — COMPREHENSIVE METABOLIC PANEL
ALT: 15 U/L — AB (ref 17–63)
AST: 24 U/L (ref 15–41)
Albumin: 3.6 g/dL (ref 3.5–5.0)
Alkaline Phosphatase: 55 U/L (ref 38–126)
Anion gap: 9 (ref 5–15)
BUN: 20 mg/dL (ref 6–20)
CHLORIDE: 108 mmol/L (ref 101–111)
CO2: 21 mmol/L — ABNORMAL LOW (ref 22–32)
CREATININE: 1.34 mg/dL — AB (ref 0.61–1.24)
Calcium: 9 mg/dL (ref 8.9–10.3)
GFR calc Af Amer: 56 mL/min — ABNORMAL LOW (ref >60)
GFR, EST NON AFRICAN AMERICAN: 48 mL/min — AB (ref >60)
Glucose, Bld: 126 mg/dL — ABNORMAL HIGH (ref 65–99)
Potassium: 3.8 mmol/L (ref 3.5–5.1)
SODIUM: 138 mmol/L (ref 135–145)
Total Bilirubin: 0.4 mg/dL (ref 0.3–1.2)
Total Protein: 6.8 g/dL (ref 6.5–8.1)

## 2016-12-09 LAB — APTT: aPTT: 28 seconds (ref 24–36)

## 2016-12-09 LAB — CBC
HCT: 35.3 % — ABNORMAL LOW (ref 39.0–52.0)
Hemoglobin: 11.2 g/dL — ABNORMAL LOW (ref 13.0–17.0)
MCH: 28 pg (ref 26.0–34.0)
MCHC: 31.7 g/dL (ref 30.0–36.0)
MCV: 88.3 fL (ref 78.0–100.0)
Platelets: UNDETERMINED 10*3/uL (ref 150–400)
RBC: 4 MIL/uL — ABNORMAL LOW (ref 4.22–5.81)
RDW: 13.6 % (ref 11.5–15.5)
WBC: 5.2 10*3/uL (ref 4.0–10.5)

## 2016-12-09 LAB — HEMOGLOBIN A1C
Hgb A1c MFr Bld: 6.4 % — ABNORMAL HIGH (ref 4.8–5.6)
Mean Plasma Glucose: 136.98 mg/dL

## 2016-12-09 LAB — SURGICAL PCR SCREEN
MRSA, PCR: NEGATIVE
Staphylococcus aureus: NEGATIVE

## 2016-12-09 LAB — PROTIME-INR
INR: 0.96
Prothrombin Time: 12.7 seconds (ref 11.4–15.2)

## 2016-12-09 LAB — ABO/RH: ABO/RH(D): O POS

## 2016-12-09 NOTE — Patient Instructions (Addendum)
   Continue taking all current medications without change through the day before surgery.  Have nothing to eat or drink after midnight the night before surgery.  On the morning of surgery take only Norvasc and hydralazine with a sip of water.

## 2016-12-09 NOTE — Progress Notes (Signed)
Per Thurmond Butts at office patient is supposed to continue aspirin up until day of surgery.  (not to take day of surgery- patient was instructed).

## 2016-12-09 NOTE — Pre-Procedure Instructions (Signed)
[place]  12/09/2016      [id number] - [place], [place] [address] [place] [address] Phone: [phone] Fax: [phone]  [place], [place] [facility] [address] [place] [address] [place] [address] Phone: [phone] Fax: [phone]    Your procedure is scheduled on September 18  Report to [facility] at [email or web address].  Call this number if you have problems the morning of surgery:  [phone]   Remember:  Do not eat food or drink liquids after midnight.  Continue all other medications as directed by your physician except follow these medication instructions before surgery   Take these medicines the morning of surgery with A SIP OF WATER  amLODipine (NORVASC)  hydrALAZINE (APRESOLINE)  7 days prior to surgery STOP taking anyAleve, Naproxen, Ibuprofen, Motrin, Advil, Goody's, BC's, all herbal medications, fish oil, and all vitamins  Follow your doctors instructions regarding your Aspirin.  If no instructions were given by the doctor you will need to call the office to get instructions.  Your pre admission RN will also call for those instructions    Do not wear jewelry  Do not wear lotions, powders, or cologne, or deoderant.   Men may shave face and neck.  Do not bring valuables to the hospital.  [facility] is not responsible for any belongings or valuables.  Contacts, dentures or bridgework may not be worn into surgery.  Leave your suitcase in the car.  After surgery it may be brought to your room.  For patients admitted to the hospital, discharge time will be determined by your treatment team.  Patients discharged the day of surgery will not be allowed to drive home.    Special instructions:   [name]- Preparing For Surgery  Before surgery, you can play an important role. Because skin is not sterile, your skin  needs to be as free of germs as possible. You can reduce the number of germs on your skin by washing with CHG (chlorahexidine gluconate) Soap before surgery.  CHG is an antiseptic cleaner which kills germs and bonds with the skin to continue killing germs even after washing.  Please do not use if you have an allergy to CHG or antibacterial soaps. If your skin becomes reddened/irritated stop using the CHG.  Do not shave (including legs and underarms) for at least 48 hours prior to first CHG shower. It is OK to shave your face.  Please follow these instructions carefully.   1. Shower the NIGHT BEFORE SURGERY and the MORNING OF SURGERY with CHG.   2. If you chose to wash your hair, wash your hair first as usual with your normal shampoo.  3. After you shampoo, rinse your hair and body thoroughly to remove the shampoo.  4. Use CHG as you would any other liquid soap. You can apply CHG directly to the skin and wash gently with a scrungie or a clean washcloth.   5. Apply the CHG Soap to your body ONLY FROM THE NECK DOWN.  Do not use on open wounds or open sores. Avoid contact with your eyes, ears, mouth and genitals (private parts). Wash genitals (private parts) with your normal soap.  6. Wash thoroughly, paying special attention to the area where your surgery will be performed.  7. Thoroughly rinse your body with warm water from the neck down.  8. DO NOT  shower/wash with your normal soap after using and rinsing off the CHG Soap.  9. Pat yourself dry with a CLEAN TOWEL.   10. Wear CLEAN PAJAMAS   11. Place CLEAN SHEETS on your bed the night of your first shower and DO NOT SLEEP WITH PETS.    Day of Surgery: Do not apply any deodorants/lotions. Please wear clean clothes to the hospital/surgery center.      Please read over the following fact sheets that you were given.

## 2016-12-09 NOTE — Progress Notes (Signed)
HE[facility] AND [place] [facility]GERY CONSULTATION REPORT  Referring Provider is [name], MD PCP is [name], MD  Chief Complaint  Patient presents with  . Aortic Stenosis    2nd TAVR eval, review all studies,surgery scheduled for 12/15/2016    HPI:  Patient is an 83 year old moderately obese African-American male with history of aortic stenosis, chronic combined systolic and diastolic congestive heart failure, hypertension, and hyperlipidemia who has been referred for second surgical consultation to discuss treatment options for management of severe symptomatic aortic stenosis. Patient has known about the presence of a heart murmur for several years and has been followed by Dr. [name] with serial echocardiograms.  In 10/30/2015 the patient was hospitalized briefly with acute exacerbation of chronic diastolic congestive heart failure in the setting of hypertensive crisis after the patient had been off of his blood pressure medications for approximately 2 weeks.  Echocardiogram performed at that time revealed findings consistent with moderate aortic stenosis and mild to moderate left ventricular systolic dysfunction with ejection fraction estimated 40-45%.  Peak velocity across the aortic valve was reported 3.4 m/s corresponding to mean transvalvular gradient estimated 30 mmHg. The DVI was 0.27. The patient's symptoms improved rapidly with resumption of medical therapy. Diagnostic cardiac catheterization performed 11/14/2015 revealed moderate to severe left ventricular systolic dysfunction with ejection fraction estimated 35-45%. Peak to peak and mean transvalvular gradients were reported 15 and 18 mmHg respectively, corresponding to aortic valve area calculated 1.73 cm. 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  The patient's medical therapy was  adjusted. Since that time the patient reports stable symptoms of exertional shortness of breath that occurs with more strenuous physical exertion. The patient was seen in follow-up by Dr. [name] in May and follow-up echocardiogram performed 10/08/2016 revealed significant drop in left ventricular systolic function with ejection fraction estimated at only 25-30%.  Peak velocity across the aortic valve measured 3.3 m/s corresponding to mean transvalvular gradient estimated 32 mmHg and aortic valve area calculated 0.8 cm. The DVI was 0.23.  He was subsequently referred to Dr. [name] and underwent CT angiography to characterize the feasibility of transcatheter aortic valve replacement. The patient was referred for surgical consultation and has previously been evaluated by Dr. Cyndia Bent. Plans have been made for transcatheter aortic valve replacement early next week and a second surgical opinion has been requested.  The patient is married and lives locally in [place] with his wife. He has an attorney and continues to practice regularly. He has remained reasonably active physically and enjoys playing golf on a regular basis. He describes stable symptoms of exertional shortness of breath that occurs only with more strenuous physical exertion such as going up a flight of stairs or walking up a steep incline. The patient denies any recent symptoms of resting shortness of breath, PND, orthopnea, or lower extremity edema. He has never had any chest pain or chest tightness either with activity or at rest. The patient reports no other significant physical limitations.  Past Medical History:  Diagnosis Date  . CHF (congestive heart failure) ([place])   . Dyslipidemia 10/27/2015  . Dyspnea    w/ exertion   . Elevated PSA 10/27/2015  . Erectile dysfunction 10/27/2015  . Heart murmur   . Hypertension 10/27/2015  . Hypogonadism male 10/27/2015  . Obesity 10/27/2015  . Pneumonia 10/27/2015   pt states was 1982  .  Rotator  cuff tear 10/27/2015    Past Surgical History:  Procedure Laterality Date  . CARDIAC CATHETERIZATION N/A 11/14/2015   Procedure: Left Heart Cath and Coronary Angiography;  Surgeon: [name], MD;  Location: [facility];  Service: Cardiovascular;  Laterality: N/A;    Family History  Problem Relation Age of Onset  . Diabetes Mother   . Heart disease Mother   . Pulmonary embolism Father     Social History   Social History  . Marital status: Single    Spouse name: N/A  . Number of children: N/A  . Years of education: N/A   Occupational History  . Not on file.   Social History Main Topics  . Smoking status: Former Smoker    Types: Cigarettes  . Smokeless tobacco: Never Used  . Alcohol use Yes     Comment: rarely  . Drug use: No  . Sexual activity: Not on file   Other Topics Concern  . Not on file   Social History Narrative  . No narrative on file    Current Outpatient Prescriptions  Medication Sig Dispense Refill  . amLODipine (NORVASC) 10 MG tablet Take 1 tablet (10 mg total) by mouth daily. 30 tablet 11  . aspirin (ASPIRIN EC) 81 MG EC tablet Take 81 mg by mouth daily. Swallow whole.    . carvedilol (COREG) 6.25 MG tablet Take 1 tablet (6.25 mg total) by mouth 2 (two) times daily. 180 tablet 3  . furosemide (LASIX) 40 MG tablet TAKE 1 TABLET BY MOUTH  DAILY 90 tablet 3  . hydrALAZINE (APRESOLINE) 25 MG tablet TAKE 1 TABLET BY MOUTH EVERY 8 HOURS 90 tablet 11  . Multiple Vitamin (MULTIVITAMIN WITH MINERALS) TABS tablet Take 1 tablet by mouth daily. 30 tablet 1  . naproxen sodium (ANAPROX) 220 MG tablet Take 220 mg by mouth 2 (two) times daily as needed (pain).    Marland Kitchen olmesartan (BENICAR) 40 MG tablet TAKE 1 TABLET BY MOUTH  DAILY 90 tablet 3  . omega-3 acid ethyl esters (LOVAZA) 1 g capsule TAKE ONE CAPSULE BY MOUTH EVERY DAY 30 capsule 11  . VITAMIN B COMPLEX-C PO Take 1 tablet by mouth daily.     No current facility-administered medications for this  visit.     No Known Allergies    Review of Systems:   General:  normal appetite, normal energy, no weight gain, no weight loss, no fever  Cardiac:  no chest pain with exertion, no chest pain at rest, + SOB with exertion, no resting SOB, no PND, no orthopnea, no palpitations, no arrhythmia, no atrial fibrillation, no LE edema, no dizzy spells, no syncope  Respiratory:  + exertional shortness of breath, no home oxygen, no productive cough, occasional dry cough, no bronchitis, no wheezing, no hemoptysis, no asthma, no pain with inspiration or cough, no sleep apnea, no CPAP at night  GI:   no difficulty swallowing, no reflux, no frequent heartburn, no hiatal hernia, no abdominal pain, no constipation, no diarrhea, no hematochezia, no hematemesis, no melena  GU:   no dysuria,  no frequency, no urinary tract infection, no hematuria, + enlarged prostate, no kidney stones, no kidney disease  Vascular:  no pain suggestive of claudication, no pain in feet, no leg cramps, no varicose veins, no DVT, no non-healing foot ulcer  Neuro:   no stroke, no TIA's, no seizures, no headaches, no temporary blindness one eye,  no slurred speech, no peripheral neuropathy, no chronic pain,  no instability of gait, no memory/cognitive dysfunction  Musculoskeletal: no arthritis, no joint swelling, no myalgias, no difficulty walking, normal mobility   Skin:   no rash, no itching, no skin infections, no pressure sores or ulcerations  Psych:   no anxiety, no depression, no nervousness, no unusual recent stress  Eyes:   no blurry vision, no floaters, no recent vision changes, + wears glasses or contacts  ENT:   no hearing loss, no loose or painful teeth, + dentures  Hematologic:  no easy bruising, no abnormal bleeding, no clotting disorder, no frequent epistaxis  Endocrine:  no diabetes, does not check CBG's at home           Physical Exam:   BP 130/70   Pulse 62   Resp 20   Ht 6' (1.829 m)   Wt 234 lb (106.1 kg)    SpO2 99% Comment: RA  BMI 31.74 kg/m   General:  Moderately obese,  well-appearing  HEENT:  Unremarkable   Neck:   no JVD, no bruits, no adenopathy   Chest:   clear to auscultation, symmetrical breath sounds, no wheezes, no rhonchi   CV:   RRR, grade IV/VI crescendo/decrescendo murmur heard best at sternal border,  no diastolic murmur  Abdomen:  soft, non-tender, no masses   Extremities:  warm, well-perfused, pulses palpable, no LE edema  Rectal/GU  Deferred  Neuro:   Grossly non-focal and symmetrical throughout  Skin:   Clean and dry, no rashes, no breakdown   Diagnostic Tests:  Transthoracic Echocardiography  Patient:    Slater, Mcmanaman MR #:       [id number] Study Date: 10/27/2015 Gender:     M Age:        68 Height:     177.8 cm Weight:     110.6 kg BSA:        2.38 m^2 Pt. Status: Room:       [facility], [place]  SONOGRAPHER  Johny Chess, RDCS, CCT  PERFORMING   [facility] In[facility]  ADMITTING    Mariel Aloe  ORDERING     Corrie Mckusick  cc:  ------------------------------------------------------------------- LV EF: 40% -   45%  ------------------------------------------------------------------- Indications:      CHF - 428.0.  ------------------------------------------------------------------- History:   PMH:   Dyspnea.  Risk factors:  Aortic stenosis. Hypertension. Dyslipidemia.  ------------------------------------------------------------------- Study Conclusions  - Left ventricle: The cavity size was mildly dilated. Systolic   function was mildly to moderately reduced. The estimated ejection   fraction was in the range of 40% to 45%. Diffuse hypokinesis.   Features are consistent with a pseudonormal left ventricular   filling pattern, with concomitant abnormal relaxation and   increased filling pressure (grade 2 diastolic dysfunction). - Aortic valve: Valve mobility was restricted. There  was moderate   stenosis. There was mild regurgitation. Peak velocity (S): 343   cm/s. Mean gradient (S): 30 mm Hg. Valve area (VTI): 0.96 cm^2.   Valve area (Vmax): 1.03 cm^2. Valve area (Vmean): 0.92 cm^2. - Mitral valve: There was mild regurgitation. - Left atrium: The atrium was severely dilated. - Right ventricle: The cavity size was normal. Wall thickness was   normal. Systolic function was normal. - Tricuspid valve: There was trivial regurgitation. - Inferior vena cava: The vessel was normal in size. The   respirophasic diameter changes were in the normal range (>= 50%),   consistent with normal  central venous pressure.  ------------------------------------------------------------------- Study data:  No prior study was available for comparison.  Study status:  Routine.  Procedure:  The patient reported no pain pre or post test. Transthoracic echocardiography. Image quality was adequate.  Study completion:  There were no complications. Transthoracic echocardiography.  M-mode, complete 2D, spectral Doppler, and color Doppler.  Birthdate:  Patient birthdate: June 21, 1933.  Age:  Patient is 83 yr old.  Sex:  Gender: male. BMI: 35 kg/m^2.  Blood pressure:     143/31  Patient status: Inpatient.  Study date:  Study date: 10/27/2015. Study time: 08:12 AM.  Location:  Bedside.  -------------------------------------------------------------------  ------------------------------------------------------------------- Left ventricle:  The cavity size was mildly dilated. Systolic function was mildly to moderately reduced. The estimated ejection fraction was in the range of 40% to 45%. Diffuse hypokinesis. Features are consistent with a pseudonormal left ventricular filling pattern, with concomitant abnormal relaxation and increased filling pressure (grade 2 diastolic dysfunction).  ------------------------------------------------------------------- Aortic valve:   Trileaflet; severely  thickened, severely calcified leaflets. Valve mobility was restricted.  Doppler:   There was moderate stenosis.   There was mild regurgitation.    VTI ratio of LVOT to aortic valve: 0.25. Valve area (VTI): 0.96 cm^2. Indexed valve area (VTI): 0.41 cm^2/m^2. Peak velocity ratio of LVOT to aortic valve: 0.27. Valve area (Vmax): 1.03 cm^2. Indexed valve area (Vmax): 0.43 cm^2/m^2. Mean velocity ratio of LVOT to aortic valve: 0.24. Valve area (Vmean): 0.92 cm^2. Indexed valve area (Vmean): 0.39 cm^2/m^2.    Mean gradient (S): 30 mm Hg. Peak gradient (S): 47 mm Hg.  ------------------------------------------------------------------- Aorta:  Aortic root: The aortic root was normal in size.  ------------------------------------------------------------------- Mitral valve:   Structurally normal valve.   Mobility was not restricted.  Doppler:  Transvalvular velocity was within the normal range. There was no evidence for stenosis. There was mild regurgitation.  ------------------------------------------------------------------- Left atrium:  The atrium was severely dilated.  ------------------------------------------------------------------- Right ventricle:  The cavity size was normal. Wall thickness was normal. Systolic function was normal.  ------------------------------------------------------------------- Pulmonic valve:    Structurally normal valve.   Cusp separation was normal.  Doppler:  Transvalvular velocity was within the normal range. There was no evidence for stenosis. There was trivial regurgitation.  ------------------------------------------------------------------- Tricuspid valve:   Structurally normal valve.    Doppler: Transvalvular velocity was within the normal range. There was trivial regurgitation.  ------------------------------------------------------------------- Pulmonary artery:   The main pulmonary artery was normal-sized. Systolic pressure was  within the normal range.  ------------------------------------------------------------------- Right atrium:  The atrium was normal in size.  ------------------------------------------------------------------- Pericardium:  There was no pericardial effusion.  ------------------------------------------------------------------- Systemic veins: Inferior vena cava: The vessel was normal in size. The respirophasic diameter changes were in the normal range (>= 50%), consistent with normal central venous pressure.  ------------------------------------------------------------------- Measurements   Left ventricle                            Value          Reference  LV ID, ED, PLAX chordal           (H)     57.9  mm       43 - 52  LV ID, ES, PLAX chordal           (H)     44.9  mm       23 - 38  LV fx shortening, PLAX chordal    (L)  22    %        >=29  LV PW thickness, ED                       8.7   mm       ---------  IVS/LV PW ratio, ED                       1.16           <=1.3  Stroke volume, 2D                         76    ml       ---------  Stroke volume/bsa, 2D                     32    ml/m^2   ---------    Ventricular septum                        Value          Reference  IVS thickness, ED                         10.1  mm       ---------    LVOT                                      Value          Reference  LVOT ID, S                                22    mm       ---------  LVOT area                                 3.8   cm^2     ---------  LVOT peak velocity, S                     92.8  cm/s     ---------  LVOT mean velocity, S                     62.7  cm/s     ---------  LVOT VTI, S                               20.1  cm       ---------    Aortic valve                              Value          Reference  Aortic valve peak velocity, S             343   cm/s     ---------  Aortic valve mean velocity, S             260   cm/s     ---------  Aortic valve VTI, S  79.3  cm       ---------  Aortic mean gradient, S                   30    mm Hg    ---------  Aortic peak gradient, S                   47    mm Hg    ---------  VTI ratio, LVOT/AV                        0.25           ---------  Aortic valve area, VTI                    0.96  cm^2     ---------  Aortic valve area/bsa, VTI                0.41  cm^2/m^2 ---------  Velocity ratio, peak, LVOT/AV             0.27           ---------  Aortic valve area, peak velocity          1.03  cm^2     ---------  Aortic valve area/bsa, peak               0.43  cm^2/m^2 ---------  velocity  Velocity ratio, mean, LVOT/AV             0.24           ---------  Aortic valve area, mean velocity          0.92  cm^2     ---------  Aortic valve area/bsa, mean               0.39  cm^2/m^2 ---------  velocity    Aorta                                     Value          Reference  Aortic root ID, ED                        32    mm       ---------    Left atrium                               Value          Reference  LA ID, A-P, ES                            51    mm       ---------  LA ID/bsa, A-P                            2.15  cm/m^2   <=2.2  LA volume, S                              111   ml       ---------  LA volume/bsa, S  46.7  ml/m^2   ---------  LA volume, ES, 1-p A4C                    99.1  ml       ---------  LA volume/bsa, ES, 1-p A4C                41.7  ml/m^2   ---------  LA volume, ES, 1-p A2C                    126   ml       ---------  LA volume/bsa, ES, 1-p A2C                53    ml/m^2   ---------    Pulmonary arteries                        Value          Reference  PA pressure, S, DP                        27    mm Hg    <=30    Tricuspid valve                           Value          Reference  Tricuspid regurg peak velocity            245.1 cm/s     ---------  Tricuspid peak RV-RA gradient             24    mm Hg    ---------  Tricuspid maximal  regurg                  245.1 cm/s     ---------  velocity, PISA    Systemic veins                            Value          Reference  Estimated CVP                             3     mm Hg    ---------    Right ventricle                           Value          Reference  TAPSE                                     21.4  mm       ---------  RV pressure, S, DP                        27    mm Hg    <=30  RV s&', lateral, S                         13.5  cm/s     ---------  Legend: (L)  and  (H)  mark values outside specified reference range.  -------------------------------------------------------------------  Prepared and Electronically Authenticated by  Skeet Latch, MD 2017-07-30T13:21:39   Left Heart Cath and Coronary Angiography  Conclusion     There is moderate to severe left ventricular systolic dysfunction.  LV end diastolic pressure is moderately elevated.  The left ventricular ejection fraction is 35-45% by visual estimate.  There is moderate aortic valve stenosis. There is trivial (1+) aortic regurgitation.    Calcific aortic valve disease with mild aortic regurgitation and mild to moderate aortic stenosis.  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  Widely patent normal appearing coronary arteries.  Estimated left ventricular ejection fraction is 30-35% with elevated end-diastolic pressure and global hypokinesis. Findings are compatible with nonischemic cardiomyopathy  Mild pulmonary hypertension  RECOMMENDATIONS:   Optimize medical therapy   Indications   Chronic systolic HF (heart failure) (HCC) [I50.22 (ICD-10-CM)]  AVD (aortic valve disease) [I35.9 (ICD-10-CM)]  Essential hypertension [I10 (ICD-10-CM)]  Procedural Details/Technique   Technical Details The right radial area was sterilely prepped and draped. Intravenous sedation with Versed and fentanyl was administered. 1% Xylocaine was infiltrated to achieve  local analgesia. A double wall stick with an angiocath was utilized to obtain intra-arterial access. The modified Seldinger technique was used to place a 24F " Slender" sheath in the right radial artery. Weight based heparin was administered. Coronary angiography was done using 5 F catheters. Right coronary angiography was performed with a JR4. Left ventricular hemodymic recordings and angiography was done using the JR 4 catheter and hand injection. Left coronary angiography was performed with a JL 3.5 cm. The right coronary artery could not be selectively engaged with the [place]. We also had difficulty selectively engaging the left coronary with the JL 3.5 cm catheter. We will ultimately able to perform angiography on each coronary using a 3.5 cm 5 [place] EBU catheter.  Supravalvular aortography was performed using a straight pigtail catheter at 18 cc/s for a total of 40 cc of contrast. This was performed with power injection.  Right heart catheterization was performed by exchanging an antecubital angiocath IV for a 5 French brachial sheath. 1% Xylocaine local infiltration at the IV site was given prior to sheath exchange. A double glove technique was used. The modified Seldinger technique was employed. After sheath insertion, right heart cath was performed using a 5 French balloon tipped catheter. Pressures were recorded in each chamber. A main pulmonary artery O2 saturation was obtained. I wedge pressure was also measured.   Hemostasis was achieved using a pneumatic band.  During this procedure the patient is administered a total of Versed 2 mg and Fentanyl 50 mg to achieve and maintain moderate conscious sedation. The patient's heart rate, blood pressure, and oxygen saturation are monitored continuously during the procedure. The period of conscious sedation is 37 minutes, of which I was present face-to-face 100% of this time.   Estimated blood loss <50 mL. . During this procedure the patient was  administered the following to achieve and maintain moderate conscious sedation: Versed 2 mg, Fentanyl 50 mcg, while the patient's heart rate, blood pressure, and oxygen saturation were continuously monitored. The period of conscious sedation was 37 minutes, of which I was present face-to-face 100% of this time.    Coronary Findings   Dominance: Co-dominant  Left Anterior Descending  Third Diagonal Branch  Vessel is small in size.  Wall Motion   Resting               Left Heart   Left Ventricle The  left ventricle is dilated. There is moderate to severe left ventricular systolic dysfunction. LV end diastolic pressure is moderately elevated. The left ventricular ejection fraction is 35-45% by visual estimate. There are LV function abnormalities due to global hypokinesis.    Aortic Valve There is moderate aortic valve stenosis. There is trivial (1+) aortic regurgitation. The aortic valve is calcified. There is restricted aortic valve motion.    Coronary Diagrams   Diagnostic Diagram       Implants     No implant documentation for this case.  PACS Images   Show images for Cardiac catheterization   Link to Procedure Log   Procedure Log    Hemo Data    Most Recent Value  Fick Cardiac Output 5.85 L/min  Fick Cardiac Output Index 2.57 (L/min)/BSA  Aortic Mean Gradient 18.2 mmHg  Aortic Peak Gradient 15 mmHg  Aortic Valve Area 1.73  Aortic Value Area Index 0.76 cm2/BSA  RA A Wave 11 mmHg  RA V Wave 10 mmHg  RA Mean 7 mmHg  RV Systolic Pressure 45 mmHg  RV Diastolic Pressure 3 mmHg  RV EDP 10 mmHg  PA Systolic Pressure 43 mmHg  PA Diastolic Pressure 14 mmHg  PA Mean 25 mmHg  PW A Wave 21 mmHg  PW V Wave 18 mmHg  PW Mean 15 mmHg  AO Systolic Pressure 270 mmHg  AO Diastolic Pressure 74 mmHg  AO Mean 350 mmHg  LV Systolic Pressure 093 mmHg  LV Diastolic Pressure 13 mmHg  LV EDP 28 mmHg  Arterial Occlusion Pressure Extended Systolic Pressure 818 mmHg  Arterial  Occlusion Pressure Extended Diastolic Pressure 72 mmHg  Arterial Occlusion Pressure Extended Mean Pressure 99 mmHg  Left Ventricular Apex Extended Systolic Pressure 299 mmHg  Left Ventricular Apex Extended Diastolic Pressure 15 mmHg  Left Ventricular Apex Extended EDP Pressure 31 mmHg  QP/QS 1  TPVR Index 9.74 HRUI  TSVR Index 39.35 HRUI  PVR SVR Ratio 0.11  TPVR/TSVR Ratio 0.25      Transthoracic Echocardiography  Patient:    Sourish, Allender MR #:       [id number] Study Date: 10/08/2016 Gender:     M Age:        76 Height:     182.9 cm Weight:     107.7 kg BSA:        2.37 m^2 Pt. Status: Room:   Ivar Bury, MD  [place], MD  ATTENDING    Kirk Ruths  SONOGRAPHER  Cindy Hazy, RDCS  PERFORMING   [facility] Out[facility]  cc:  ------------------------------------------------------------------- LV EF: 25% -   30%  ------------------------------------------------------------------- Indications:      I50.9 Congestive Heart Failure.  ------------------------------------------------------------------- History:   PMH:  Acquired from the patient and from the patient&'s chart.  PMH:  Pneumonia.  Risk factors:  Hypertension. Obese. Dyslipidemia.  ------------------------------------------------------------------- Study Conclusions  - Left ventricle: The cavity size was mildly dilated. Wall   thickness was normal. Systolic function was severely reduced. The   estimated ejection fraction was in the range of 25% to 30%.   Diffuse hypokinesis. Doppler parameters are consistent with   restrictive physiology, indicative of decreased left ventricular   diastolic compliance and/or increased left atrial pressure.   Doppler parameters are consistent with high ventricular filling   pressure. - Aortic valve: Valve mobility was restricted. There was severe   stenosis. There was trivial regurgitation. - Ascending aorta: The ascending aorta  was mildly dilated. - Left atrium: The atrium was severely dilated. - Right ventricle: The cavity size was mildly dilated. - Right atrium: The atrium was mildly dilated. - Pulmonary arteries: Systolic pressure was mildly increased.  Impressions:  - Severe global reduction in LV systolic function; mild LVE;   restrictive filling; calcified aortic valve with severe AS (mean   gradient 32 mmHg; AVA 0.8 cm2); trace AI; mildly dilated   ascending aorta; severe LAE; mild RAE/RVE; mildly elevated   pulmonary pressure.  ------------------------------------------------------------------- Study data:   Study status:  Routine.  Procedure:  The patient reported no pain pre or post test. Transthoracic echocardiography for left ventricular function evaluation, for right ventricular function evaluation, and for assessment of valvular function. Image quality was adequate.  Study completion:  There were no complications.          Transthoracic echocardiography.  M-mode, complete 2D, spectral Doppler, and color Doppler.  Birthdate: Patient birthdate: 11-10-1933.  Age:  Patient is 83 yr old.  Sex: Gender: male.    BMI: 32.2 kg/m^2.  Blood pressure:     124/66 Patient status:  Outpatient.  Study date:  Study date: 10/08/2016. Study time: 02:55 PM.  Location:  [facility]  -------------------------------------------------------------------  ------------------------------------------------------------------- Left ventricle:  The cavity size was mildly dilated. Wall thickness was normal. Systolic function was severely reduced. The estimated ejection fraction was in the range of 25% to 30%. Diffuse hypokinesis. Doppler parameters are consistent with restrictive physiology, indicative of decreased left ventricular diastolic compliance and/or increased left atrial pressure. Doppler parameters are consistent with high ventricular filling pressure.     ------------------------------------------------------------------- Aortic valve:   Trileaflet; severely calcified leaflets. Valve mobility was restricted.  Doppler:   There was severe stenosis. There was trivial regurgitation.    VTI ratio of LVOT to aortic valve: 0.22. Valve area (VTI): 0.82 cm^2. Indexed valve area (VTI): 0.35 cm^2/m^2. Peak velocity ratio of LVOT to aortic valve: 0.23. Valve area (Vmax): 0.89 cm^2. Indexed valve area (Vmax): 0.38 cm^2/m^2. Mean velocity ratio of LVOT to aortic valve: 0.21. Valve area (Vmean): 0.79 cm^2. Indexed valve area (Vmean): 0.33 cm^2/m^2.    Mean gradient (S): 32 mm Hg. Peak gradient (S): 44 mm Hg.  ------------------------------------------------------------------- Aorta:  Aortic root: The aortic root was normal in size. Ascending aorta: The ascending aorta was mildly dilated.  ------------------------------------------------------------------- Mitral valve:   Structurally normal valve.   Mobility was not restricted.  Doppler:  Transvalvular velocity was within the normal range. There was no evidence for stenosis. There was trivial regurgitation.    Peak gradient (D): 4 mm Hg.  ------------------------------------------------------------------- Left atrium:  The atrium was severely dilated.  ------------------------------------------------------------------- Right ventricle:  The cavity size was mildly dilated. Systolic function was normal.  ------------------------------------------------------------------- Pulmonic valve:    Doppler:  Transvalvular velocity was within the normal range. There was no evidence for stenosis. There was trivial regurgitation.  ------------------------------------------------------------------- Tricuspid valve:   Structurally normal valve.    Doppler: Transvalvular velocity was within the normal range. There was trivial  regurgitation.  ------------------------------------------------------------------- Pulmonary artery:   Systolic pressure was mildly increased.  ------------------------------------------------------------------- Right atrium:  The atrium was mildly dilated.  ------------------------------------------------------------------- Pericardium:  There was no pericardial effusion.  ------------------------------------------------------------------- Systemic veins: Inferior vena cava: The vessel was normal in size.  ------------------------------------------------------------------- Measurements   Left ventricle                            Value  Reference  LV ID, ED, PLAX chordal           (H)     57.8  mm       43 - 52  LV ID, ES, PLAX chordal           (H)     47.5  mm       23 - 38  LV fx shortening, PLAX chordal    (L)     18    %        >=29  LV PW thickness, ED                       9.24  mm       ---------  IVS/LV PW ratio, ED                       0.84           <=1.3  Stroke volume, 2D                         69    ml       ---------  Stroke volume/bsa, 2D                     29    ml/m^2   ---------  LV e&', lateral                            4.3   cm/s     ---------  LV E/e&', lateral                          22.6           ---------  LV e&', medial                             3.95  cm/s     ---------  LV E/e&', medial                           24.61          ---------  LV e&', average                            4.13  cm/s     ---------  LV E/e&', average                          23.56          ---------    Ventricular septum                        Value          Reference  IVS thickness, ED                         7.74  mm       ---------    LVOT  Value          Reference  LVOT ID, S                                22    mm       ---------  LVOT area                                 3.8   cm^2     ---------  LVOT ID                                    22    mm       ---------  LVOT peak velocity, S                     77.2  cm/s     ---------  LVOT mean velocity, S                     53.8  cm/s     ---------  LVOT VTI, S                               18.2  cm       ---------  LVOT peak gradient, S                     2     mm Hg    ---------  Stroke volume (SV), LVOT DP               69.2  ml       ---------  Stroke index (SV/bsa), LVOT DP            29.2  ml/m^2   ---------    Aortic valve                              Value          Reference  Aortic valve peak velocity, S             330   cm/s     ---------  Aortic valve mean velocity, S             258   cm/s     ---------  Aortic valve VTI, S                       84.4  cm       ---------  Aortic mean gradient, S                   32    mm Hg    ---------  Aortic peak gradient, S                   44    mm Hg    ---------  VTI ratio, LVOT/AV                        0.22           ---------  Aortic valve area, VTI  0.82  cm^2     ---------  Aortic valve area/bsa, VTI                0.35  cm^2/m^2 ---------  Velocity ratio, peak, LVOT/AV             0.23           ---------  Aortic valve area, peak velocity          0.89  cm^2     ---------  Aortic valve area/bsa, peak               0.38  cm^2/m^2 ---------  velocity  Velocity ratio, mean, LVOT/AV             0.21           ---------  Aortic valve area, mean velocity          0.79  cm^2     ---------  Aortic valve area/bsa, mean               0.33  cm^2/m^2 ---------  velocity  Aortic regurg pressure half-time          617   ms       ---------    Aorta                                     Value          Reference  Aortic root ID, ED                        33    mm       ---------  Ascending aorta ID, A-P, S                38    mm       ---------    Left atrium                               Value          Reference  LA ID, A-P, ES                            56    mm       ---------  LA  ID/bsa, A-P                    (H)     2.36  cm/m^2   <=2.2  LA volume, S                              132   ml       ---------  LA volume/bsa, S                          55.7  ml/m^2   ---------  LA volume, ES, 1-p A4C                    117   ml       ---------  LA volume/bsa, ES, 1-p A4C                49.4  ml/m^2   ---------  LA volume, ES, 1-p A2C                    145   ml       ---------  LA volume/bsa, ES, 1-p A2C                61.2  ml/m^2   ---------    Mitral valve                              Value          Reference  Mitral E-wave peak velocity               97.2  cm/s     ---------  Mitral A-wave peak velocity               44.4  cm/s     ---------  Mitral deceleration time                  162   ms       150 - 230  Mitral peak gradient, D                   4     mm Hg    ---------  Mitral E/A ratio, peak                    2.2            ---------    Tricuspid valve                           Value          Reference  Tricuspid regurg peak velocity            286   cm/s     ---------  Tricuspid peak RV-RA gradient             33    mm Hg    ---------    Right ventricle                           Value          Reference  RV s&', lateral, S                         13.2  cm/s     ---------  Legend: (L)  and  (H)  mark values outside specified reference range.  ------------------------------------------------------------------- Prepared and Electronically Authenticated by  Kirk Ruths 2018-07-12T16:58:45   Cardiac TAVR CT  TECHNIQUE: The patient was scanned on a Siemens [id number] scanner. A 120 kV retrospective scan was triggered in the ascending thoracic aorta at 140 HU's. Gantry rotation speed was 250 msecs and collimation was.6 mm. No beta blockade or nitro were given. The 3D data set was reconstructed in 5% intervals of the R-R cycle. Systolic and diastolic phases were analyzed on a dedicated work station using MPR, MIP and VRT modes. The patient received 80  cc of contrast.  FINDINGS: Aortic Valve:  Tri leaflet and calcified with restricted motion  Aorta: Mild calcific atherosclerotic debris with no aneurysm and no coarctation  Sinotubular Junction:  31 mm  Ascending Thoracic Aorta:  36 mm  Aortic Arch:  29 mm  Descending Thoracic Aorta:  28 mm  Sinus of Valsalva Measurements:  Non-coronary:  31 mm  Right -coronary:  30 mm  Left -coronary:  31 mm  Coronary Artery Height above Annulus:  Left Main:  16.4 mm above annulus  Right Coronary:  16.4 mm above annulus  Virtual Basal Annulus Measurements:  Maximum/Minimum Diameter:  27.2 mm x 21.3 mm  Perimeter:  76.5 mm  Area:  449 mm2  Coronary Arteries:  Sufficient height above annulus for deployment  Optimum Fluoroscopic Angle for Delivery: LAO 34 degrees Cranial 22 degrees  IMPRESSION: 1) Calcified tri leaflet aortic valve with annulus 449 mm2 suitable for a 26 mm Sapien 3 valve  2) Optimum angle for deployment LAO 34 degrees Cranial 22 degrees  3) Coronary arteries sufficient height above annulus for deployment. 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   On: 11/13/2016 16:36   CT ANGIOGRAPHY CHEST, ABDOMEN AND PELVIS  TECHNIQUE: Multidetector CT imaging through the chest, abdomen and pelvis was performed using the standard protocol during bolus administration of intravenous contrast. Multiplanar reconstructed images and MIPs were obtained and reviewed to evaluate the vascular anatomy.  CONTRAST:  100 mL of Isovue 370.  COMPARISON:  No priors.  FINDINGS: CTA CHEST FINDINGS  Cardiovascular: Heart size is enlarged with mild left ventricular dilatation and moderate left atrial dilatation. There is no significant pericardial fluid,  thickening or pericardial calcification. Aortic atherosclerosis. No definite coronary artery calcifications. Thickening calcification of the aortic valve.  Mediastinum/Lymph Nodes: No pathologically enlarged mediastinal or hilar lymph nodes. Esophagus is unremarkable in appearance. No axillary lymphadenopathy.  Lungs/Pleura: Diffuse bronchial wall thickening with mild centrilobular and paraseptal emphysema. 4 mm nodule in the apex of the left upper lobe (axial image 15 of series 10). No other larger more suspicious appearing pulmonary nodules or masses are otherwise noted. No acute consolidative airspace disease. No pleural effusions. Scarring and architectural distortion in the inferior aspect of the right upper lobe.  Musculoskeletal/Soft Tissues: There are no aggressive appearing lytic or blastic lesions noted in the visualized portions of the skeleton.  CTA ABDOMEN AND PELVIS FINDINGS  Hepatobiliary: No cystic or solid hepatic lesions. No intra or extrahepatic biliary ductal dilatation. Gallbladder is normal in appearance.  Pancreas: No pancreatic mass. No pancreatic ductal dilatation. No pancreatic or peripancreatic fluid or inflammatory changes.  Spleen: Unremarkable.  Adrenals/Urinary Tract: Bilateral adrenal glands are normal in appearance. Multiple low-attenuation lesions in both kidneys, compatible with simple cysts, the largest which measures 6.7 cm in the lower pole the right kidney. No hydroureteronephrosis. Urinary bladder is unremarkable in appearance.  Stomach/Bowel: Normal appearance of the stomach. No pathologic dilatation of small bowel or colon. A few scattered colonic diverticulae are noted, without surrounding inflammatory changes to suggest an acute diverticulitis at this time. Normal appendix.  Vascular/Lymphatic: Aortic atherosclerosis, with vascular findings and measurements pertinent to potential TAVR procedure, as detailed below. No  aneurysm or dissection noted in the abdominal or pelvic vasculature. Common origin of celiac axis and superior mesenteric artery (so-called celiacomesenteric trunk, a normal anatomical variant). This common trunk and the inferior mesenteric artery are patent without hemodynamically significant stenosis. Single right renal artery and 2 left renal artery is are patent without hemodynamically significant stenosis. No lymphadenopathy noted in the abdomen or pelvis.  Reproductive: Prostate gland is massively  enlarged with severe median lobe hypertrophy measuring 5.7 x 6.5 x 6.5 cm. Seminal vesicles are unremarkable in appearance.  Other: No significant volume of ascites.  No pneumoperitoneum.  Musculoskeletal: There are no aggressive appearing lytic or blastic lesions noted in the visualized portions of the skeleton.  VASCULAR MEASUREMENTS PERTINENT TO TAVR:  AORTA:  Minimal Aortic Diameter -  20 x 18 mm  Severity of Aortic Calcification -  mild  RIGHT PELVIS:  Right Common Iliac Artery -  Minimal Diameter - 9.9 x 9.5 mm  Tortuosity - mild  Calcification - mild  Right External Iliac Artery -  Minimal Diameter - 8.0 x 7.7 mm  Tortuosity - severe  Calcification - mild  Right Common Femoral Artery -  Minimal Diameter - 9.3 x 6.4 mm  Tortuosity - mild  Calcification - mild  LEFT PELVIS:  Left Common Iliac Artery -  Minimal Diameter - 10.7 x 10.9 mm  Tortuosity - mild  Calcification - mild  Left External Iliac Artery -  Minimal Diameter - 6.5 x 6.0 mm  Tortuosity - severe  Calcification - mild  Left Common Femoral Artery -  Minimal Diameter - 7.7 x 7.5 mm  Tortuosity - mild  Calcification - mild  Review of the MIP images confirms the above findings.  IMPRESSION: 1. Vascular findings and measurements pertinent to potential TAVR procedure, as detailed above. This patient does have suitable pelvic arterial access  bilaterally. 2. Severe thickening calcification of the aortic valve, compatible with the reported clinical history of aortic stenosis. 3. Mild diffuse bronchial wall thickening with mild centrilobular and paraseptal emphysema; imaging findings suggestive of underlying COPD. 4. 4 mm nodule in the apex of the left upper lobe (axial image 14 of series 10). Non-contrast chest CT can be considered in 12 months if patient is high-risk. This recommendation follows the consensus statement: Guidelines for Management of Incidental Pulmonary Nodules Detected on CT Images: From the Fleischner Society 2017; Radiology 2017; 284:228-243. 5. Cardiomegaly with left ventricular and left atrial dilatation. 6. Multiple simple cysts in the kidneys bilaterally. 7. Mild colonic diverticulosis without evidence of acute diverticulitis at this time.  Aortic Atherosclerosis (ICD10-I70.0) and Emphysema (ICD10-J43.9).   Electronically Signed   By: Vinnie Langton M.D.   On: 11/13/2016 16:31   STS Risk Calculator  Procedure: AV Replacement  Risk of Mortality: 2.986%  Morbidity or Mortality: 27.446%  Long Length of Stay: 11.924%  Short Length of Stay: 18.487%  Permanent Stroke: 1.991%  Prolonged Ventilation: 17.326%  DSW Infection: 0.665%  Renal Failure: 9.116%  Reoperation: 10.683%     Impression:  Patient has stage D severe symptomatic low gradient oh ejection fraction aortic stenosis. He describes stable symptoms of exertional shortness of breath consistent with chronic combined systolic and diastolic congestive heart failure, New [place] Heart Association functional class II. I have personally reviewed the patient's recent transthoracic echocardiogram, diagnostic cardiac catheterization performed last year, and CT angiograms. Echocardiogram reveals moderate to severe thickening, moderate calcification, and restricted leaflet mobility involving all 3 leaflets of the patient's aortic valve.  Peak  velocity across the aortic valve ranged between 3.2 and 3.8 m/s corresponding to mean transvalvular gradient 32 mmHg in the setting of severe global left ventricular systolic dysfunction. The DVI was reported 0.21.  Diagnostic cardiac catheterization performed last year was notable for the absence of significant coronary artery disease. I agree that aortic valve replacement is indicated, and risks associated with conventional surgery would be moderately elevated because of the patient's age,  a left ventricular systolic dysfunction, and mild chronic kidney disease.  Cardiac-gated CTA of the heart reveals anatomical characteristics consistent with aortic stenosis suitable for treatment by transcatheter aortic valve replacement without any significant complicating features and CTA of the aorta and iliac vessels demonstrate what appears to be adequate pelvic vascular access to facilitate a transfemoral approach.  Under the circumstances transcatheter aortic valve replacement seems a reasonable alternative to conventional surgery.    Plan:  The patient was counseled at length regarding treatment alternatives for management of severe symptomatic aortic stenosis. Alternative approaches such as conventional aortic valve replacement, transcatheter aortic valve replacement, and continued medical therapy were compared and contrasted at length.  The risks associated with conventional surgical aortic valve replacement were been discussed in detail, as were expectations for post-operative convalescence.  Issues specific to transcatheter aortic valve replacement were discussed including questions about long term valve durability, the potential for paravalvular leak, possible increased risk of need for permanent pacemaker placement, and other technical complications related to the procedure itself.  Long-term prognosis with medical therapy was discussed. This discussion was placed in the context of the patient's own specific  clinical presentation and past medical history.  All of his questions been addressed.  The patient desires to proceed with transcatheter aortic valve replacement next week as previously scheduled.  Following the decision to proceed with transcatheter aortic valve replacement, a discussion has been held regarding what types of management strategies would be attempted intraoperatively in the event of life-threatening complications, including whether or not the patient would be considered a candidate for the use of cardiopulmonary bypass and/or conversion to open sternotomy for attempted surgical intervention.  The patient has been advised of a variety of complications that might develop including but not limited to risks of death, stroke, paravalvular leak, aortic dissection or other major vascular complications, aortic annulus rupture, device embolization, cardiac rupture or perforation, mitral regurgitation, acute myocardial infarction, arrhythmia, heart block or bradycardia requiring permanent pacemaker placement, congestive heart failure, respiratory failure, renal failure, pneumonia, infection, other late complications related to structural valve deterioration or migration, or other complications that might ultimately cause a temporary or permanent loss of functional independence or other long term morbidity.  The patient provides full informed consent for the procedure as described and all questions were answered.  I spent in excess of 90 minutes during the conduct of this office consultation and >50% of this time involved direct face-to-face encounter with the patient for counseling and/or coordination of their care.    Valentina Gu. Roxy Manns, MD 12/09/2016 3:58 PM

## 2016-12-10 ENCOUNTER — Inpatient Hospital Stay (HOSPITAL_COMMUNITY): Admission: RE | Admit: 2016-12-10 | Payer: Medicare Other | Source: Ambulatory Visit

## 2016-12-10 ENCOUNTER — Encounter: Payer: Medicare Other | Admitting: Thoracic Surgery (Cardiothoracic Vascular Surgery)

## 2016-12-10 LAB — BLOOD GAS, ARTERIAL
Acid-Base Excess: 1.5 mmol/L (ref 0.0–2.0)
Bicarbonate: 25.1 mmol/L (ref 20.0–28.0)
DRAWN BY: 470591
FIO2: 21
O2 SAT: 98 %
PCO2 ART: 37 mmHg (ref 32.0–48.0)
PH ART: 7.447 (ref 7.350–7.450)
Patient temperature: 98.6
pO2, Arterial: 104 mmHg (ref 83.0–108.0)

## 2016-12-10 NOTE — Progress Notes (Signed)
Anesthesia Chart Review: Patient is a 83 year old male scheduled for TAVR, transfemoral approach on 12/15/16 by Dr. Sherren Mocha and Dr. Gilford Raid.   History includes severe AS with murmur, chronic combined diastolic and systolic CHF, PVCs, CKD stage II, former smoker, HTN, dyslipidemia, exertional dyspnea, elevated PSA.  PCP is Dr. [name]. Cardiologist is Dr. Daneen Schick.  Meds include amlodipine, aspirin 81 mg, Coreg, Lasix, hydralazine, Benicar, Lovaza.   BP 135/62   Pulse (!) 57   Temp 36.7 C (Oral)   Resp 20   Ht 6' (1.829 m)   Wt 232 lb 9.4 oz (105.5 kg)   SpO2 96%   BMI 31.54 kg/m   EKG 12/09/16: SR with first degree AV block with occasional PVCs, septal infarct (age undetermined).  Echo 10/08/16: Study Conclusions - Left ventricle: The cavity size was mildly dilated. Wall   thickness was normal. Systolic function was severely reduced. The   estimated ejection fraction was in the range of 25% to 30%.   Diffuse hypokinesis. Doppler parameters are consistent with   restrictive physiology, indicative of decreased left ventricular   diastolic compliance and/or increased left atrial pressure.   Doppler parameters are consistent with high ventricular filling   pressure. - Aortic valve: Valve mobility was restricted. There was severe   stenosis. There was trivial regurgitation. - Ascending aorta: The ascending aorta was mildly dilated. - Left atrium: The atrium was severely dilated. - Right ventricle: The cavity size was mildly dilated. - Right atrium: The atrium was mildly dilated. - Pulmonary arteries: Systolic pressure was mildly increased. Impressions: - Severe global reduction in LV systolic function; mild LVE;   restrictive filling; calcified aortic valve with severe AS (mean   gradient 32 mmHg; AVA 0.8 cm2); trace AI; mildly dilated   ascending aorta; severe LAE; mild RAE/RVE; mildly elevated   pulmonary pressure.  Cardiac cath 11/14/15:   There is  moderate to severe left ventricular systolic dysfunction.  LV end diastolic pressure is moderately elevated.  The left ventricular ejection fraction is 35-45% by visual estimate.  There is moderate aortic valve stenosis. There is trivial (1+) aortic regurgitation.   Calcific aortic valve disease with mild aortic regurgitation and mild to moderate aortic stenosis.  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  Widely patent normal appearing coronary arteries.  Estimated left ventricular ejection fraction is 30-35% with elevated end-diastolic pressure and global hypokinesis. Findings are compatible with nonischemic cardiomyopathy  Mild pulmonary hypertension RECOMMENDATIONS: Optimize medical therapy  Carotid U/S 11/13/16: Summary: Findings suggest 1-39% internal carotid artery stenosis bilaterally. The left vertebral artery is patent with antegrade flow. Unable to visualize the right vertebral artery due to patient body habitus.  CTA chest/abd/pelvis 11/13/16: IMPRESSION: 1. Vascular findings and measurements pertinent to potential TAVR procedure, as detailed above. This patient does have suitable pelvic arterial access bilaterally. 2. Severe thickening calcification of the aortic valve, compatible with the reported clinical history of aortic stenosis. 3. Mild diffuse bronchial wall thickening with mild centrilobular and paraseptal emphysema; imaging findings suggestive of underlying COPD. 4. 4 mm nodule in the apex of the left upper lobe (axial image 14 of series 10). Non-contrast chest CT can be considered in 12 months if patient is high-risk. This recommendation follows the consensus statement: Guidelines for Management of Incidental Pulmonary Nodules Detected on CT Images: From the Fleischner Society 2017; Radiology 2017; 284:228-243. 5. Cardiomegaly with left ventricular and left atrial dilatation. 6. Multiple simple cysts in the  kidneys  bilaterally. 7. Mild colonic diverticulosis without evidence of acute diverticulitis at this time.  Preoperative cardiac CT and CXR noted.   PFTs 11/13/16: FVC 3.37 (85%), FEV1 2.27 (78%), DLCO unc 16.77 (47%).   Preoperative labs noted. Cr 1.34. H/H 11.2/35.3. Platelet clumps noted on smear, unable to estimate (were 146K on 11/11/15, clumped 10/26/15). PT/PTT WNL. Glucose 126. A1c 6.4. Trace leukocytes on UA, negative nitrites.   I will order a repeat platelet count for the day of surgery. If results acceptable and otherwise no acute changes then I anticipate that he can proceed as planned.  George Hugh [facility] Stay Center/Anesthesiology Phone [phone] 12/10/2016 12:48 PM

## 2016-12-13 ENCOUNTER — Other Ambulatory Visit: Payer: Self-pay | Admitting: Interventional Cardiology

## 2016-12-14 MED ORDER — NOREPINEPHRINE BITARTRATE 1 MG/ML IV SOLN
0.0000 ug/min | INTRAVENOUS | Status: AC
  Administered 2016-12-15: 1 ug/min via INTRAVENOUS
  Filled 2016-12-14: qty 4

## 2016-12-14 MED ORDER — NITROGLYCERIN IN D5W 200-5 MCG/ML-% IV SOLN
2.0000 ug/min | INTRAVENOUS | Status: DC
  Filled 2016-12-14: qty 250

## 2016-12-14 MED ORDER — DEXMEDETOMIDINE HCL IN NACL 400 MCG/100ML IV SOLN
0.1000 ug/kg/h | INTRAVENOUS | Status: AC
  Administered 2016-12-15: 1.5 ug/kg/h via INTRAVENOUS
  Filled 2016-12-14: qty 100

## 2016-12-14 MED ORDER — POTASSIUM CHLORIDE 2 MEQ/ML IV SOLN
80.0000 meq | INTRAVENOUS | Status: DC
  Filled 2016-12-14: qty 40

## 2016-12-14 MED ORDER — CHLORHEXIDINE GLUCONATE 0.12 % MT SOLN
15.0000 mL | Freq: Once | OROMUCOSAL | Status: AC
  Administered 2016-12-15: 15 mL via OROMUCOSAL
  Filled 2016-12-14: qty 15

## 2016-12-14 MED ORDER — EPINEPHRINE PF 1 MG/ML IJ SOLN
0.0000 ug/min | INTRAVENOUS | Status: DC
  Filled 2016-12-14: qty 4

## 2016-12-14 MED ORDER — DEXTROSE 5 % IV SOLN
1.5000 g | INTRAVENOUS | Status: AC
  Administered 2016-12-15: 1.5 g via INTRAVENOUS
  Filled 2016-12-14 (×2): qty 1.5

## 2016-12-14 MED ORDER — SODIUM CHLORIDE 0.9 % IV SOLN: INTRAVENOUS | Status: DC

## 2016-12-14 MED ORDER — SODIUM CHLORIDE 0.9 % IV SOLN
30.0000 ug/min | INTRAVENOUS | Status: DC
  Filled 2016-12-14: qty 2

## 2016-12-14 MED ORDER — SODIUM CHLORIDE 0.9 % IV SOLN
INTRAVENOUS | Status: DC
  Filled 2016-12-14: qty 1

## 2016-12-14 MED ORDER — DOPAMINE-DEXTROSE 3.2-5 MG/ML-% IV SOLN
0.0000 ug/kg/min | INTRAVENOUS | Status: DC
  Filled 2016-12-14: qty 250

## 2016-12-14 MED ORDER — SODIUM CHLORIDE 0.9 % IV SOLN
INTRAVENOUS | Status: DC
  Filled 2016-12-14: qty 30

## 2016-12-14 MED ORDER — VANCOMYCIN HCL 10 G IV SOLR
1500.0000 mg | INTRAVENOUS | Status: AC
  Administered 2016-12-15: 1500 mg via INTRAVENOUS
  Filled 2016-12-14: qty 1500

## 2016-12-14 MED ORDER — MAGNESIUM SULFATE 50 % IJ SOLN
40.0000 meq | INTRAMUSCULAR | Status: DC
  Filled 2016-12-14: qty 10

## 2016-12-14 NOTE — H&P (Signed)
[address]       [place] [address]             [phone]      Cardiothoracic Surgery Admission History and Physical   Referring Provider is [name], MD PCP is [name], MD      Chief Complaint  Patient presents with  . 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 [name] for moderate aortic stenosis and chronic diastolic heart failure. He has done well overall and continues to work but reports an episode about a year ago when he ran out of his antihypertensive medication for about 10 days and was hospitalized with acute heart failure. An echo at that time on 10/27/2015 showed an EF of 40-45%. The mean aortic valve gradient was 30 mm Hg with severely calcified and thickened leaflets. A cath on 11/14/2015 showed widely patent coronary arteries with an EF of 30-35% with elevated EDP of 31. The mean AV gradient was 18.2 mm Hg with an AVA calculated at 1.73 cm2. He has been continued on medical therapy for his hypertension and diastolic heart failure and says that he feels well but gets short of breath and fatigued with exertion so he tries not to do activities that cause symptoms like walking up hills. He has stairs in his house but goes up very slowly. He denies orthopnea and PND. He denies dizziness and syncope, has no chest pressure or pain but does have some ankle swelling. He recently had a repeat echo on 10/08/2016 that showed progression of his AS with a mean gradient of 32 mm Hg with a peak of 44 mm Hg. The peak velocity ratio is 0.23. The LVEF was severely reduced with an EF of 25-30% with doppler parameters consistent with restrictive physiology. The calculated AVR is 0.8 cm2.      Past Medical History:  Diagnosis Date  . Dyslipidemia 10/27/2015  . Elevated PSA 10/27/2015  . Erectile dysfunction 10/27/2015    . Hypertension 10/27/2015  . Hypogonadism male 10/27/2015  . Obesity 10/27/2015  . Pneumonia 10/27/2015  . Rotator cuff tear 10/27/2015         Past Surgical History:  Procedure Laterality Date  . CARDIAC CATHETERIZATION N/A 11/14/2015   Procedure: Left Heart Cath and Coronary Angiography;  Surgeon: [name], MD;  Location: [facility];  Service: Cardiovascular;  Laterality: N/A;         Family History  Problem Relation Age of Onset  . Diabetes Mother   . Heart disease Mother   . Pulmonary embolism Father     Social History        Social History  . Marital status: Single    Spouse name: N/A  . Number of children: N/A  . Years of education: N/A      Occupational History  . Not on file.        Social History Main Topics  . Smoking status: Former Smoker    Types: Cigarettes  . Smokeless tobacco: Never Used  . Alcohol use No  . Drug use: No  . Sexual activity: Not on file       Other Topics Concern  . Not on file      Social History Narrative  . No narrative on file  Current Outpatient Prescriptions  Medication Sig Dispense Refill  . amLODipine (NORVASC) 10 MG tablet Take 1 tablet (10 mg total) by mouth daily. 30 tablet 11  . aspirin (ASPIRIN EC) 81 MG EC tablet Take 81 mg by mouth daily. Swallow whole.    . carvedilol (COREG) 6.25 MG tablet Take 1 tablet (6.25 mg total) by mouth 2 (two) times daily. 180 tablet 3  . furosemide (LASIX) 40 MG tablet TAKE 1 TABLET BY MOUTH  DAILY 90 tablet 3  . hydrALAZINE (APRESOLINE) 25 MG tablet TAKE 1 TABLET BY MOUTH EVERY 8 HOURS 90 tablet 11  . Multiple Vitamin (MULTIVITAMIN WITH MINERALS) TABS tablet Take 1 tablet by mouth daily. 30 tablet 1  . olmesartan (BENICAR) 40 MG tablet TAKE 1 TABLET BY MOUTH  DAILY 90 tablet 3  . omega-3 acid ethyl esters (LOVAZA) 1 g capsule TAKE ONE CAPSULE BY MOUTH EVERY DAY 30 capsule 11  . VITAMIN B COMPLEX-C PO Take 1 tablet by mouth daily.      No current facility-administered medications for this visit.     No Known Allergies    Review of Systems:              General:                      normal appetite, decreased energy with activity, no weight gain, no weight loss, no fever             Cardiac:                       no chest pain with exertion, no chest pain at rest, has SOB with mild exertion, no resting SOB, no PND, no orthopnea, no palpitations, no arrhythmia, no atrial fibrillation, has LE edema, no dizzy spells, no syncope             Respiratory:                 exertional shortness of breath, no home oxygen, no productive cough, has dry cough, no bronchitis, has wheezing, no hemoptysis, no asthma, no pain with inspiration or cough, no sleep apnea, no CPAP at night             GI:                               no difficulty swallowing, no reflux, no frequent heartburn, no hiatal hernia, no abdominal pain, no constipation, no diarrhea, no hematochezia, no hematemesis, no melena             GU:                              no dysuria,  no frequency, no urinary tract infection, no hematuria, has enlarged prostate, no kidney stones, no kidney disease             Vascular:                     no pain suggestive of claudication, has pain in feet, no leg cramps, has varicose veins, no DVT, no non-healing foot ulcer             Neuro:                         no  stroke, no TIA's, no seizures, no headaches, no temporary blindness one eye,  no slurred speech, has peripheral neuropathy, no chronic pain, no instability of gait, no memory/cognitive dysfunction             Musculoskeletal:         no arthritis, no joint swelling, no myalgias, no difficulty walking, normal mobility              Skin:                            no rash, no itching, no skin infections, no pressure sores or ulcerations             Psych:                         no anxiety, no depression, no nervousness, no unusual recent stress             Eyes:                            no blurry vision, no floaters, no recent vision changes,  wears glasses              ENT:                            no hearing loss, no loose or painful teeth, has dentures             Hematologic:               no easy bruising, no abnormal bleeding, no clotting disorder, no frequent epistaxis             Endocrine:                   no diabetes, does not check CBG's at home                            Physical Exam:              BP 133/66 (BP Location: Right Arm, Patient Position: Sitting, Cuff Size: Large)   Pulse (!) 51   Resp 16   Ht 6' (1.829 m)   Wt 230 lb (104.3 kg)   SpO2 96% Comment: ON RA  BMI 31.19 kg/m              General:                      Elderly but well-appearing             HEENT:                       Unremarkable, NCAT, PERLA, EOMI, oropharynx clear             Neck:                           no JVD, no bruits, no adenopathy or thyromegaly             Chest:                          clear to auscultation, symmetrical breath sounds,  no wheezes, no rhonchi              CV:                              RRR, grade III/VI crescendo/decrescendo murmur heard best at RSB,  no diastolic murmur             Abdomen:                    soft, non-tender, no masses or organomegaly             Extremities:                 warm, well-perfused, pulses palpable in feet, no LE edema             Rectal/GU                   Deferred             Neuro:                         Grossly non-focal and symmetrical throughout             Skin:                            Clean and dry, no rashes, no breakdown   Diagnostic Tests:  [facility] [address], [place] [address] [phone]  ------------------------------------------------------------------- Transthoracic Echocardiography  Patient: Sharief, Wainwright MR #: [id number] Study Date:  10/08/2016 Gender: M Age: 19 Height: 182.9 cm Weight: 107.7 kg BSA: 2.37 m^2 Pt. Status: Room:  Ivar Bury, MD [place], MD ATTENDING Kirk Ruths SONOGRAPHER Cindy Hazy, RDCS PERFORMING [facility], Outpatient  cc:  ------------------------------------------------------------------- LV EF: 25% - 30%  ------------------------------------------------------------------- Indications: I50.9 Congestive Heart Failure.  ------------------------------------------------------------------- History: PMH: Acquired from the patient and from the patient&'s chart. PMH: Pneumonia. Risk factors: Hypertension. Obese. Dyslipidemia.  ------------------------------------------------------------------- Study Conclusions  - Left ventricle: The cavity size was mildly dilated. Wall thickness was normal. Systolic function was severely reduced. The estimated ejection fraction was in the range of 25% to 30%. Diffuse hypokinesis. Doppler parameters are consistent with restrictive physiology, indicative of decreased left ventricular diastolic compliance and/or increased left atrial pressure. Doppler parameters are consistent with high ventricular filling pressure. - Aortic valve: Valve mobility was restricted. There was severe stenosis. There was trivial regurgitation. - Ascending aorta: The ascending aorta was mildly dilated. - Left atrium: The atrium was severely dilated. - Right ventricle: The cavity size was mildly dilated. - Right atrium: The atrium was mildly dilated. - Pulmonary arteries: Systolic pressure was mildly increased.  Impressions:  - Severe global reduction in LV systolic function; mild LVE; restrictive filling; calcified aortic valve with severe AS (mean gradient 32 mmHg; AVA 0.8 cm2); trace AI; mildly dilated ascending aorta; severe LAE; mild RAE/RVE; mildly  elevated pulmonary pressure.  ------------------------------------------------------------------- Study data: Study status: Routine. Procedure: The patient reported no pain pre or post test. Transthoracic echocardiography for left ventricular function evaluation, for right ventricular function evaluation, and for assessment of valvular function. Image quality was adequate. Study completion: There were no complications. Transthoracic echocardiography. M-mode, complete 2D, spectral Doppler, and color Doppler. Birthdate: Patient birthdate: [birth year]. Age: Patient is 83 yr old. Sex: Gender: male. BMI: 32.2 kg/m^2. Blood pressure:  124/66 Patient status: Outpatient. Study date: Study date: 10/08/2016. Study time: 02:55 PM. Location: [facility]  -------------------------------------------------------------------  ------------------------------------------------------------------- Left ventricle: The cavity size was mildly dilated. Wall thickness was normal. Systolic function was severely reduced. The estimated ejection fraction was in the range of 25% to 30%. Diffuse hypokinesis. Doppler parameters are consistent with restrictive physiology, indicative of decreased left ventricular diastolic compliance and/or increased left atrial pressure. Doppler parameters are consistent with high ventricular filling pressure.  ------------------------------------------------------------------- Aortic valve: Trileaflet; severely calcified leaflets. Valve mobility was restricted. Doppler: There was severe stenosis. There was trivial regurgitation. VTI ratio of LVOT to aortic valve: 0.22. Valve area (VTI): 0.82 cm^2. Indexed valve area (VTI): 0.35 cm^2/m^2. Peak velocity ratio of LVOT to aortic valve: 0.23. Valve area (Vmax): 0.89 cm^2. Indexed valve area (Vmax): 0.38 cm^2/m^2. Mean velocity ratio of LVOT to aortic valve: 0.21. Valve area (Vmean):  0.79 cm^2. Indexed valve area (Vmean): 0.33 cm^2/m^2. Mean gradient (S): 32 mm Hg. Peak gradient (S): 44 mm Hg.  ------------------------------------------------------------------- Aorta: Aortic root: The aortic root was normal in size. Ascending aorta: The ascending aorta was mildly dilated.  ------------------------------------------------------------------- Mitral valve: Structurally normal valve. Mobility was not restricted. Doppler: Transvalvular velocity was within the normal range. There was no evidence for stenosis. There was trivial regurgitation. Peak gradient (D): 4 mm Hg.  ------------------------------------------------------------------- Left atrium: The atrium was severely dilated.  ------------------------------------------------------------------- Right ventricle: The cavity size was mildly dilated. Systolic function was normal.  ------------------------------------------------------------------- Pulmonic valve: Doppler: Transvalvular velocity was within the normal range. There was no evidence for stenosis. There was trivial regurgitation.  ------------------------------------------------------------------- Tricuspid valve: Structurally normal valve. Doppler: Transvalvular velocity was within the normal range. There was trivial regurgitation.  ------------------------------------------------------------------- Pulmonary artery: Systolic pressure was mildly increased.  ------------------------------------------------------------------- Right atrium: The atrium was mildly dilated.  ------------------------------------------------------------------- Pericardium: There was no pericardial effusion.  ------------------------------------------------------------------- Systemic veins: Inferior vena cava: The vessel was normal in size.  ------------------------------------------------------------------- Measurements  Left  ventricle Value Reference LV ID, ED, PLAX chordal (H) 57.8 mm 43 - 52 LV ID, ES, PLAX chordal (H) 47.5 mm 23 - 38 LV fx shortening, PLAX chordal (L) 18 % >=29 LV PW thickness, ED 9.24 mm --------- IVS/LV PW ratio, ED 0.84 <=1.3 Stroke volume, 2D 69 ml --------- Stroke volume/bsa, 2D 29 ml/m^2 --------- LV e&', lateral 4.3 cm/s --------- LV E/e&', lateral 22.6 --------- LV e&', medial 3.95 cm/s --------- LV E/e&', medial 24.61 --------- LV e&', average 4.13 cm/s --------- LV E/e&', average 23.56 ---------  Ventricular septum Value Reference IVS thickness, ED 7.74 mm ---------  LVOT Value Reference LVOT ID, S 22 mm --------- LVOT area 3.8 cm^2 --------- LVOT ID 22 mm --------- LVOT peak velocity, S 77.2 cm/s --------- LVOT mean velocity, S 53.8 cm/s --------- LVOT VTI, S 18.2 cm --------- LVOT peak gradient, S 2 mm Hg --------- Stroke volume (SV), LVOT DP 69.2 ml --------- Stroke index (SV/bsa), LVOT DP 29.2 ml/m^2 ---------  Aortic valve Value Reference Aortic valve peak velocity, S 330 cm/s --------- Aortic valve mean  velocity, S 258 cm/s --------- Aortic valve VTI, S 84.4 cm --------- Aortic mean gradient, S 32 mm Hg --------- Aortic peak gradient, S 44 mm Hg --------- VTI ratio, LVOT/AV 0.22 --------- Aortic valve area, VTI 0.82 cm^2 --------- Aortic valve area/bsa, VTI 0.35 cm^2/m^2 --------- Velocity ratio, peak, LVOT/AV 0.23 --------- Aortic valve area, peak velocity 0.89 cm^2 --------- Aortic valve area/bsa, peak 0.38 cm^2/m^2 --------- velocity Velocity ratio, mean, LVOT/AV 0.21 --------- Aortic valve area,  mean velocity 0.79 cm^2 --------- Aortic valve area/bsa, mean 0.33 cm^2/m^2 --------- velocity Aortic regurg pressure half-time 617 ms ---------  Aorta Value Reference Aortic root ID, ED 33 mm --------- Ascending aorta ID, A-P, S 38 mm ---------  Left atrium Value Reference LA ID, A-P, ES 56 mm --------- LA ID/bsa, A-P (H) 2.36 cm/m^2 <=2.2 LA volume, S 132 ml --------- LA volume/bsa, S 55.7 ml/m^2 --------- LA volume, ES, 1-p A4C 117 ml --------- LA volume/bsa, ES, 1-p A4C 49.4 ml/m^2 --------- LA volume, ES, 1-p A2C 145 ml --------- LA volume/bsa, ES, 1-p A2C 61.2 ml/m^2 ---------  Mitral valve Value Reference Mitral E-wave peak velocity 97.2 cm/s --------- Mitral  A-wave peak velocity 44.4 cm/s --------- Mitral deceleration time 162 ms 150 - 230 Mitral peak gradient, D 4 mm Hg --------- Mitral E/A ratio, peak 2.2 ---------  Tricuspid valve Value Reference Tricuspid regurg peak velocity 286 cm/s --------- Tricuspid peak RV-RA gradient 33 mm Hg ---------  Right ventricle Value Reference RV s&', lateral, S 13.2 cm/s ---------  Legend: (L) and (H) mark values outside specified reference range.  ------------------------------------------------------------------- Prepared and Electronically Authenticated by  Kirk Ruths 2018-07-12T16:58:45  Physicians   Panel Physicians Referring Physician Case Authorizing Physician  [name], MD (Primary)    Procedures   Left Heart Cath and Coronary Angiography  Conclusion     There is moderate to severe left ventricular systolic dysfunction.  LV end diastolic pressure is moderately elevated.  The left ventricular ejection fraction is 35-45% by visual estimate.  There is moderate aortic valve stenosis. There is trivial (1+) aortic regurgitation.   Calcific aortic valve disease with mild aortic regurgitation and mild to moderate aortic stenosis.  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  Widely patent normal appearing coronary arteries.  Estimated left ventricular ejection fraction is 30-35% with elevated end-diastolic pressure and global hypokinesis. Findings are compatible with nonischemic cardiomyopathy  Mild pulmonary hypertension  RECOMMENDATIONS:   Optimize medical therapy   Indications   Chronic systolic HF (heart failure) (HCC) [I50.22 (ICD-10-CM)]  AVD (aortic valve disease) [I35.9  (ICD-10-CM)]  Essential hypertension [I10 (ICD-10-CM)]  Procedural Details/Technique   Technical Details The right radial area was sterilely prepped and draped. Intravenous sedation with Versed and fentanyl was administered. 1% Xylocaine was infiltrated to achieve local analgesia. A double wall stick with an angiocath was utilized to obtain intra-arterial access. The modified Seldinger technique was used to place a 84F " Slender" sheath in the right radial artery. Weight based heparin was administered. Coronary angiography was done using 5 F catheters. Right coronary angiography was performed with a JR4. Left ventricular hemodymic recordings and angiography was done using the JR 4 catheter and hand injection. Left coronary angiography was performed with a JL 3.5 cm. The right coronary artery could not be selectively engaged with the [place]. We also had difficulty selectively engaging the left coronary with the JL 3.5 cm catheter. We will ultimately able to perform angiography on each coronary using a 3.5 cm 5 [place] EBU catheter.  Supravalvular aortography was performed using a straight pigtail catheter at 18 cc/s for a total of 40 cc of contrast. This was performed with power injection.  Right heart catheterization was performed by exchanging an antecubital angiocath IV for a 5 French brachial sheath. 1% Xylocaine local infiltration at the IV site was given prior to sheath exchange. A double glove technique was used. The modified Seldinger technique was employed. After sheath insertion, right heart cath  was performed using a 5 French balloon tipped catheter. Pressures were recorded in each chamber. A main pulmonary artery O2 saturation was obtained. I wedge pressure was also measured.   Hemostasis was achieved using a pneumatic band.  During this procedure the patient is administered a total of Versed 2 mg and Fentanyl 50 mg to achieve and maintain moderate conscious sedation. The patient's heart rate,  blood pressure, and oxygen saturation are monitored continuously during the procedure. The period of conscious sedation is 37 minutes, of which I was present face-to-face 100% of this time.   Estimated blood loss <50 mL. . During this procedure the patient was administered the following to achieve and maintain moderate conscious sedation: Versed 2 mg, Fentanyl 50 mcg, while the patient's heart rate, blood pressure, and oxygen saturation were continuously monitored. The period of conscious sedation was 37 minutes, of which I was present face-to-face 100% of this time.    Coronary Findings   Dominance: Co-dominant  Left Anterior Descending  Third Diagonal Branch  Vessel is small in size.  Wall Motion        Resting               Left Heart   Left Ventricle The left ventricle is dilated. There is moderate to severe left ventricular systolic dysfunction. LV end diastolic pressure is moderately elevated. The left ventricular ejection fraction is 35-45% by visual estimate. There are LV function abnormalities due to global hypokinesis.    Aortic Valve There is moderate aortic valve stenosis. There is trivial (1+) aortic regurgitation. The aortic valve is calcified. There is restricted aortic valve motion.    Coronary Diagrams   Diagnostic Diagram       Implants        No implant documentation for this case.  PACS Images   Show images for Cardiac catheterization   Link to Procedure Log   Procedure Log    Hemo Data    Most Recent Value  Fick Cardiac Output 5.85 L/min  Fick Cardiac Output Index 2.57 (L/min)/BSA  Aortic Mean Gradient 18.2 mmHg  Aortic Peak Gradient 15 mmHg  Aortic Valve Area 1.73  Aortic Value Area Index 0.76 cm2/BSA  RA A Wave 11 mmHg  RA V Wave 10 mmHg  RA Mean 7 mmHg  RV Systolic Pressure 45 mmHg  RV Diastolic Pressure 3 mmHg  RV EDP 10 mmHg  PA Systolic Pressure 43 mmHg  PA Diastolic Pressure 14 mmHg  PA Mean 25 mmHg  PW A Wave 21  mmHg  PW V Wave 18 mmHg  PW Mean 15 mmHg  AO Systolic Pressure 035 mmHg  AO Diastolic Pressure 74 mmHg  AO Mean 009 mmHg  LV Systolic Pressure 381 mmHg  LV Diastolic Pressure 13 mmHg  LV EDP 28 mmHg  Arterial Occlusion Pressure Extended Systolic Pressure 829 mmHg  Arterial Occlusion Pressure Extended Diastolic Pressure 72 mmHg  Arterial Occlusion Pressure Extended Mean Pressure 99 mmHg  Left Ventricular Apex Extended Systolic Pressure 937 mmHg  Left Ventricular Apex Extended Diastolic Pressure 15 mmHg  Left Ventricular Apex Extended EDP Pressure 31 mmHg  QP/QS 1  TPVR Index 9.74 HRUI  TSVR Index 39.35 HRUI  PVR SVR Ratio 0.11  TPVR/TSVR Ratio 0.25   CT CORONARY MORPH W/CTA COR W/SCORE W/CA W/CM &/OR WO/CM (Accession [id number]) (Order [id number])  Imaging  Date: 11/13/2016 Department: [facility] CT IMAGING Released By: Jillyn Hidden Authorizing: Sherren Mocha, MD  Exam Information   Status Exam  Begun  Exam Ended   Final [99] 11/13/2016 1:21 PM 11/13/2016 2:14 PM  PACS Images   Show images for CT CORONARY MORPH W/CTA COR W/SCORE W/CA W/CM &/OR WO/CM  Addendum   ADDENDUM REPORT: 11/13/2016 16:36  CLINICAL DATA: Aortic stenosis  EXAM: Cardiac TAVR CT  TECHNIQUE: The patient was scanned on a Siemens [id number] scanner. A 120 kV retrospective scan was triggered in the ascending thoracic aorta at 140 HU's. Gantry rotation speed was 250 msecs and collimation was.6 mm. No beta blockade or nitro were given. The 3D data set was reconstructed in 5% intervals of the R-R cycle. Systolic and diastolic phases were analyzed on a dedicated work station using MPR, MIP and VRT modes. The patient received 80 cc of contrast.  FINDINGS: Aortic Valve: Tri leaflet and calcified with restricted motion  Aorta: Mild calcific atherosclerotic debris with no aneurysm and no coarctation  Sinotubular Junction: 31 mm  Ascending Thoracic Aorta: 36 mm  Aortic  Arch: 29 mm  Descending Thoracic Aorta: 28 mm  Sinus of Valsalva Measurements:  Non-coronary: 31 mm  Right -coronary: 30 mm  Left -coronary: 31 mm  Coronary Artery Height above Annulus:  Left Main: 16.4 mm above annulus  Right Coronary: 16.4 mm above annulus  Virtual Basal Annulus Measurements:  Maximum/Minimum Diameter: 27.2 mm x 21.3 mm  Perimeter: 76.5 mm  Area: 449 mm2  Coronary Arteries: Sufficient height above annulus for deployment  Optimum Fluoroscopic Angle for Delivery: LAO 34 degrees Cranial 22 degrees  IMPRESSION: 1) Calcified tri leaflet aortic valve with annulus 449 mm2 suitable for a 26 mm Sapien 3 valve  2) Optimum angle for deployment LAO 34 degrees Cranial 22 degrees  3) Coronary arteries sufficient height above annulus for deployment. 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 On: 11/13/2016 16:36       CT Angio Abd/Pel w/ and/or w/o (Accession [id number]) (Order [id number])  Imaging  Date: 11/13/2016 Department: [facility] CT IMAGING Released By: Selmer Dominion Authorizing: Sherren Mocha, MD  Exam Information   Status Exam Begun  Exam Ended   Final [99] 11/13/2016 2:55 PM 11/13/2016 3:17 PM  PACS Images   Show images for CT Angio Abd/Pel w/ and/or w/o  Study Result   CLINICAL DATA: 83 year old male with history of severe aortic stenosis. Preprocedural study prior to potential transcatheter aortic valve replacement (TAVR) procedure.  EXAM: CT ANGIOGRAPHY CHEST, ABDOMEN AND PELVIS  TECHNIQUE: Multidetector CT imaging through the chest, abdomen and pelvis was performed using the standard protocol during bolus administration of intravenous contrast. Multiplanar reconstructed  images and MIPs were obtained and reviewed to evaluate the vascular anatomy.  CONTRAST: 100 mL of Isovue 370.  COMPARISON: No priors.  FINDINGS: CTA CHEST FINDINGS  Cardiovascular: Heart size is enlarged with mild left ventricular dilatation and moderate left atrial dilatation. There is no significant pericardial fluid, thickening or pericardial calcification. Aortic atherosclerosis. No definite coronary artery calcifications. Thickening calcification of the aortic valve.  Mediastinum/Lymph Nodes: No pathologically enlarged mediastinal or hilar lymph nodes. Esophagus is unremarkable in appearance. No axillary lymphadenopathy.  Lungs/Pleura: Diffuse bronchial wall thickening with mild centrilobular and paraseptal emphysema. 4 mm nodule in the apex of the left upper lobe (axial image 15 of series 10). No other larger more suspicious appearing pulmonary  nodules or masses are otherwise noted. No acute consolidative airspace disease. No pleural effusions. Scarring and architectural distortion in the inferior aspect of the right upper lobe.  Musculoskeletal/Soft Tissues: There are no aggressive appearing lytic or blastic lesions noted in the visualized portions of the skeleton.  CTA ABDOMEN AND PELVIS FINDINGS  Hepatobiliary: No cystic or solid hepatic lesions. No intra or extrahepatic biliary ductal dilatation. Gallbladder is normal in appearance.  Pancreas: No pancreatic mass. No pancreatic ductal dilatation. No pancreatic or peripancreatic fluid or inflammatory changes.  Spleen: Unremarkable.  Adrenals/Urinary Tract: Bilateral adrenal glands are normal in appearance. Multiple low-attenuation lesions in both kidneys, compatible with simple cysts, the largest which measures 6.7 cm in the lower pole the right kidney. No hydroureteronephrosis. Urinary bladder is unremarkable in appearance.  Stomach/Bowel: Normal appearance of the stomach. No  pathologic dilatation of small bowel or colon. A few scattered colonic diverticulae are noted, without surrounding inflammatory changes to suggest an acute diverticulitis at this time. Normal appendix.  Vascular/Lymphatic: Aortic atherosclerosis, with vascular findings and measurements pertinent to potential TAVR procedure, as detailed below. No aneurysm or dissection noted in the abdominal or pelvic vasculature. Common origin of celiac axis and superior mesenteric artery (so-called celiacomesenteric trunk, a normal anatomical variant). This common trunk and the inferior mesenteric artery are patent without hemodynamically significant stenosis. Single right renal artery and 2 left renal artery is are patent without hemodynamically significant stenosis. No lymphadenopathy noted in the abdomen or pelvis.  Reproductive: Prostate gland is massively enlarged with severe median lobe hypertrophy measuring 5.7 x 6.5 x 6.5 cm. Seminal vesicles are unremarkable in appearance.  Other: No significant volume of ascites. No pneumoperitoneum.  Musculoskeletal: There are no aggressive appearing lytic or blastic lesions noted in the visualized portions of the skeleton.  VASCULAR MEASUREMENTS PERTINENT TO TAVR:  AORTA:  Minimal Aortic Diameter - 20 x 18 mm  Severity of Aortic Calcification - mild  RIGHT PELVIS:  Right Common Iliac Artery -  Minimal Diameter - 9.9 x 9.5 mm  Tortuosity - mild  Calcification - mild  Right External Iliac Artery -  Minimal Diameter - 8.0 x 7.7 mm  Tortuosity - severe  Calcification - mild  Right Common Femoral Artery -  Minimal Diameter - 9.3 x 6.4 mm  Tortuosity - mild  Calcification - mild  LEFT PELVIS:  Left Common Iliac Artery -  Minimal Diameter - 10.7 x 10.9 mm  Tortuosity - mild  Calcification - mild  Left External Iliac Artery -  Minimal Diameter - 6.5 x 6.0 mm  Tortuosity -  severe  Calcification - mild  Left Common Femoral Artery -  Minimal Diameter - 7.7 x 7.5 mm  Tortuosity - mild  Calcification - mild  Review of the MIP images confirms the above findings.  IMPRESSION: 1. Vascular findings and measurements pertinent to potential TAVR procedure, as detailed above. This patient does have suitable pelvic arterial access bilaterally. 2. Severe thickening calcification of the aortic valve, compatible with the reported clinical history of aortic stenosis. 3. Mild diffuse bronchial wall thickening with mild centrilobular and paraseptal emphysema; imaging findings suggestive of underlying COPD. 4. 4 mm nodule in the apex of the left upper lobe (axial image 14 of series 10). Non-contrast chest CT can be considered in 12 months if patient is high-risk. This recommendation follows the consensus statement: Guidelines for Management of Incidental Pulmonary Nodules Detected on CT Images: From the Fleischner Society 2017; Radiology 2017; 284:228-243. 5. Cardiomegaly with left ventricular and  left atrial dilatation. 6. Multiple simple cysts in the kidneys bilaterally. 7. Mild colonic diverticulosis without evidence of acute diverticulitis at this time.  Aortic Atherosclerosis (ICD10-I70.0) and Emphysema (ICD10-J43.9).   Electronically Signed By: Vinnie Langton M.D. On: 11/13/2016 16:31   STS RISK CALCULATOR Procedure: AV Replacement  Risk of Mortality: 3.007%  Morbidity or Mortality: 27.622%  Long Length of Stay: 11.997%  Short Length of Stay: 18.411%  Permanent Stroke: 2.011%  Prolonged Ventilation: 17.385%  DSW Infection: 0.653%  Renal Failure: 9.209%  Reoperation: 10.746%   Impression:  This 83 year old gentleman has stage D, severe, symptomatic aortic stenosis with NYHA class III symptoms of exertional shortness of breath and fatigue. He and his wife say that he has reduced his physical activity to avoid symptoms. His LVEF  has deteriorated over the past year from 40-45% last July to 25-30% now. His gradient has increased despite the reduction in his EF. I have personally reviewed his echo, cath and CTA studies. His aortic valve is trileaflet with severely calcified and thickened leaflets with reduced mobility. His mean gradient is 32 mm Hg with a DI of 0.23 and an indexed valve area of 0.33 cm2/m2. He has no coronary disease. I agree that AVR is indicated to improve his exertional tolerance and to prevent further deterioration in his LV function. I think he would be at moderate risk for open surgical AVR due to his age and LV function. I think TAVR would be a better option for him with less risk and a much quicker recovery. His gated cardiac CT shows anatomy favorable for TAVR using a 26 mm Sapien 3 valve. His abdominal and pelvic CT shows pelvic vasculature suitable for transfemoral insertion.  The patient and his wife were counseled at length regarding treatment alternatives for management of severe symptomatic aortic stenosis. The risks and benefits of surgical intervention has been discussed in detail. Long-term prognosis with medical therapy was discussed. Alternative approaches such as conventional surgical aortic valve replacement, transcatheter aortic valve replacement, and palliative medical therapy were compared and contrasted at length. This discussion was placed in the context of the patient's own specific clinical presentation and past medical history. All of their questions been addressed.   Following the decision to proceed with transcatheter aortic valve replacement, a discussion was held regarding what types of management strategies would be attempted intraoperatively in the event of life-threatening complications, including whether or not the patient would be considered a candidate for the use of cardiopulmonary bypass and/or conversion to open sternotomy for attempted surgical intervention. The patient has  been advised of a variety of complications that might develop including but not limited to risks of death, stroke, paravalvular leak, aortic dissection or other major vascular complications, aortic annulus rupture, device embolization, cardiac rupture or perforation, mitral regurgitation, acute myocardial infarction, arrhythmia, heart block or bradycardia requiring permanent pacemaker placement, congestive heart failure, respiratory failure, renal failure, pneumonia, infection, other late complications related to structural valve deterioration or migration, or other complications that might ultimately cause a temporary or permanent loss of functional independence or other long term morbidity. The patient provides full informed consent for the procedure as described and all questions were answered.     Plan:  Transfemoral TAVR    Gaye Pollack, MD

## 2016-12-15 ENCOUNTER — Encounter: Payer: Self-pay | Admitting: Thoracic Surgery (Cardiothoracic Vascular Surgery)

## 2016-12-15 ENCOUNTER — Inpatient Hospital Stay (HOSPITAL_COMMUNITY): Payer: Medicare Other

## 2016-12-15 ENCOUNTER — Inpatient Hospital Stay (HOSPITAL_COMMUNITY): Payer: Medicare Other | Admitting: Vascular Surgery

## 2016-12-15 ENCOUNTER — Inpatient Hospital Stay (HOSPITAL_COMMUNITY)
Admission: RE | Admit: 2016-12-15 | Discharge: 2016-12-17 | DRG: 267 | Disposition: A | Discharge status: Home / Self Care | Payer: Medicare Other | Source: Ambulatory Visit | Attending: Cardiovascular Disease | Admitting: Cardiovascular Disease

## 2016-12-15 ENCOUNTER — Encounter (HOSPITAL_COMMUNITY): Payer: Self-pay | Admitting: Certified Registered"

## 2016-12-15 ENCOUNTER — Encounter (HOSPITAL_COMMUNITY)
Admission: RE | Disposition: A | Discharge status: Home / Self Care | Payer: Self-pay | Source: Ambulatory Visit | Attending: Cardiovascular Disease

## 2016-12-15 DIAGNOSIS — Z23 Encounter for immunization: Secondary | ICD-10-CM | POA: Diagnosis not present

## 2016-12-15 DIAGNOSIS — I110 Hypertensive heart disease with heart failure: Secondary | ICD-10-CM | POA: Diagnosis not present

## 2016-12-15 DIAGNOSIS — I10 Essential (primary) hypertension: Secondary | ICD-10-CM | POA: Diagnosis present

## 2016-12-15 DIAGNOSIS — Z952 Presence of prosthetic heart valve: Secondary | ICD-10-CM | POA: Diagnosis not present

## 2016-12-15 DIAGNOSIS — Z87891 Personal history of nicotine dependence: Secondary | ICD-10-CM

## 2016-12-15 DIAGNOSIS — E785 Hyperlipidemia, unspecified: Secondary | ICD-10-CM | POA: Diagnosis present

## 2016-12-15 DIAGNOSIS — Z006 Encounter for examination for normal comparison and control in clinical research program: Secondary | ICD-10-CM | POA: Diagnosis not present

## 2016-12-15 DIAGNOSIS — R011 Cardiac murmur, unspecified: Secondary | ICD-10-CM | POA: Diagnosis not present

## 2016-12-15 DIAGNOSIS — Z79899 Other long term (current) drug therapy: Secondary | ICD-10-CM | POA: Diagnosis not present

## 2016-12-15 DIAGNOSIS — Z7982 Long term (current) use of aspirin: Secondary | ICD-10-CM | POA: Diagnosis not present

## 2016-12-15 DIAGNOSIS — E669 Obesity, unspecified: Secondary | ICD-10-CM | POA: Diagnosis present

## 2016-12-15 DIAGNOSIS — I361 Nonrheumatic tricuspid (valve) insufficiency: Secondary | ICD-10-CM | POA: Diagnosis not present

## 2016-12-15 DIAGNOSIS — I352 Nonrheumatic aortic (valve) stenosis with insufficiency: Secondary | ICD-10-CM | POA: Diagnosis not present

## 2016-12-15 DIAGNOSIS — I5042 Chronic combined systolic (congestive) and diastolic (congestive) heart failure: Secondary | ICD-10-CM | POA: Diagnosis not present

## 2016-12-15 DIAGNOSIS — Z833 Family history of diabetes mellitus: Secondary | ICD-10-CM

## 2016-12-15 DIAGNOSIS — Z8249 Family history of ischemic heart disease and other diseases of the circulatory system: Secondary | ICD-10-CM | POA: Diagnosis not present

## 2016-12-15 DIAGNOSIS — I130 Hypertensive heart and chronic kidney disease with heart failure and stage 1 through stage 4 chronic kidney disease, or unspecified chronic kidney disease: Secondary | ICD-10-CM | POA: Diagnosis not present

## 2016-12-15 DIAGNOSIS — I350 Nonrheumatic aortic (valve) stenosis: Secondary | ICD-10-CM | POA: Diagnosis not present

## 2016-12-15 DIAGNOSIS — I493 Ventricular premature depolarization: Secondary | ICD-10-CM | POA: Diagnosis not present

## 2016-12-15 DIAGNOSIS — Z6831 Body mass index (BMI) 31.0-31.9, adult: Secondary | ICD-10-CM | POA: Diagnosis not present

## 2016-12-15 DIAGNOSIS — Z7902 Long term (current) use of antithrombotics/antiplatelets: Secondary | ICD-10-CM

## 2016-12-15 DIAGNOSIS — N182 Chronic kidney disease, stage 2 (mild): Secondary | ICD-10-CM | POA: Diagnosis present

## 2016-12-15 DIAGNOSIS — J9811 Atelectasis: Secondary | ICD-10-CM | POA: Diagnosis not present

## 2016-12-15 HISTORY — PX: TRANSCATHETER AORTIC VALVE REPLACEMENT, TRANSFEMORAL: SHX6400

## 2016-12-15 HISTORY — PX: TEE WITHOUT CARDIOVERSION: SHX5443

## 2016-12-15 LAB — CBC
HCT: 32.7 % — ABNORMAL LOW (ref 39.0–52.0)
Hemoglobin: 10.6 g/dL — ABNORMAL LOW (ref 13.0–17.0)
MCH: 28.4 pg (ref 26.0–34.0)
MCHC: 32.4 g/dL (ref 30.0–36.0)
MCV: 87.7 fL (ref 78.0–100.0)
PLATELETS: DECREASED 10*3/uL (ref 150–400)
RBC: 3.73 MIL/uL — ABNORMAL LOW (ref 4.22–5.81)
RDW: 13.6 % (ref 11.5–15.5)
WBC: 4.6 10*3/uL (ref 4.0–10.5)

## 2016-12-15 LAB — POCT I-STAT 4, (NA,K, GLUC, HGB,HCT)
Glucose, Bld: 114 mg/dL — ABNORMAL HIGH (ref 65–99)
HEMATOCRIT: 29 % — AB (ref 39.0–52.0)
HEMOGLOBIN: 9.9 g/dL — AB (ref 13.0–17.0)
POTASSIUM: 3.5 mmol/L (ref 3.5–5.1)
Sodium: 143 mmol/L (ref 135–145)

## 2016-12-15 LAB — POCT I-STAT, CHEM 8
BUN: 22 mg/dL — AB (ref 6–20)
BUN: 21 mg/dL — AB (ref 6–20)
BUN: 21 mg/dL — AB (ref 6–20)
CALCIUM ION: 1.23 mmol/L (ref 1.15–1.40)
CALCIUM ION: 1.26 mmol/L (ref 1.15–1.40)
CHLORIDE: 105 mmol/L (ref 101–111)
CHLORIDE: 104 mmol/L (ref 101–111)
CREATININE: 1.2 mg/dL (ref 0.61–1.24)
CREATININE: 1.1 mg/dL (ref 0.61–1.24)
Calcium, Ion: 1.24 mmol/L (ref 1.15–1.40)
Chloride: 105 mmol/L (ref 101–111)
Creatinine, Ser: 1.3 mg/dL — ABNORMAL HIGH (ref 0.61–1.24)
GLUCOSE: 130 mg/dL — AB (ref 65–99)
Glucose, Bld: 120 mg/dL — ABNORMAL HIGH (ref 65–99)
Glucose, Bld: 130 mg/dL — ABNORMAL HIGH (ref 65–99)
HCT: 31 % — ABNORMAL LOW (ref 39.0–52.0)
HCT: 30 % — ABNORMAL LOW (ref 39.0–52.0)
HEMATOCRIT: 33 % — AB (ref 39.0–52.0)
Hemoglobin: 11.2 g/dL — ABNORMAL LOW (ref 13.0–17.0)
Hemoglobin: 10.5 g/dL — ABNORMAL LOW (ref 13.0–17.0)
Hemoglobin: 10.2 g/dL — ABNORMAL LOW (ref 13.0–17.0)
POTASSIUM: 3.7 mmol/L (ref 3.5–5.1)
Potassium: 3.9 mmol/L (ref 3.5–5.1)
Potassium: 3.9 mmol/L (ref 3.5–5.1)
SODIUM: 142 mmol/L (ref 135–145)
Sodium: 142 mmol/L (ref 135–145)
Sodium: 141 mmol/L (ref 135–145)
TCO2: 26 mmol/L (ref 22–32)
TCO2: 27 mmol/L (ref 22–32)
TCO2: 26 mmol/L (ref 22–32)

## 2016-12-15 LAB — POCT I-STAT 3, ART BLOOD GAS (G3+)
ACID-BASE DEFICIT: 2 mmol/L (ref 0.0–2.0)
BICARBONATE: 22.8 mmol/L (ref 20.0–28.0)
O2 SAT: 97 %
PCO2 ART: 35.9 mmHg (ref 32.0–48.0)
PO2 ART: 87 mmHg (ref 83.0–108.0)
Patient temperature: 96.2
TCO2: 24 mmol/L (ref 22–32)
pH, Arterial: 7.405 (ref 7.350–7.450)

## 2016-12-15 LAB — PREPARE RBC (CROSSMATCH)

## 2016-12-15 LAB — PROTIME-INR
INR: 1.07
PROTHROMBIN TIME: 13.8 s (ref 11.4–15.2)

## 2016-12-15 LAB — PLATELET COUNT: Platelets: 222 10*3/uL (ref 150–400)

## 2016-12-15 LAB — APTT: aPTT: 30 seconds (ref 24–36)

## 2016-12-15 SURGERY — IMPLANTATION, AORTIC VALVE, TRANSCATHETER, FEMORAL APPROACH
Anesthesia: Monitor Anesthesia Care | Site: Chest

## 2016-12-15 MED ORDER — LACTATED RINGERS IV SOLN
INTRAVENOUS | Status: DC | PRN
  Administered 2016-12-15: 07:00:00 via INTRAVENOUS

## 2016-12-15 MED ORDER — SODIUM CHLORIDE 0.9 % IV SOLN: 250.0000 mL | INTRAVENOUS | Status: DC | PRN

## 2016-12-15 MED ORDER — ALBUMIN HUMAN 5 % IV SOLN: 250.0000 mL | INTRAVENOUS | Status: AC | PRN

## 2016-12-15 MED ORDER — HEPARIN SODIUM (PORCINE) 1000 UNIT/ML IJ SOLN
INTRAMUSCULAR | Status: AC
  Filled 2016-12-15: qty 1

## 2016-12-15 MED ORDER — PANTOPRAZOLE SODIUM 40 MG PO TBEC
40.0000 mg | DELAYED_RELEASE_TABLET | Freq: Every day | ORAL | Status: DC
  Administered 2016-12-17: 40 mg via ORAL
  Filled 2016-12-15: qty 1

## 2016-12-15 MED ORDER — ADULT MULTIVITAMIN W/MINERALS CH
1.0000 | ORAL_TABLET | Freq: Every day | ORAL | Status: DC
  Administered 2016-12-16: 1 via ORAL
  Filled 2016-12-15: qty 1

## 2016-12-15 MED ORDER — MIDAZOLAM HCL 2 MG/2ML IJ SOLN
INTRAMUSCULAR | Status: AC
  Filled 2016-12-15: qty 2

## 2016-12-15 MED ORDER — CHLORHEXIDINE GLUCONATE 4 % EX LIQD: 30.0000 mL | CUTANEOUS | Status: DC

## 2016-12-15 MED ORDER — ONDANSETRON HCL 4 MG/2ML IJ SOLN: 4.0000 mg | Freq: Four times a day (QID) | INTRAMUSCULAR | Status: DC | PRN

## 2016-12-15 MED ORDER — OXYCODONE HCL 5 MG PO TABS: 5.0000 mg | ORAL_TABLET | ORAL | Status: DC | PRN

## 2016-12-15 MED ORDER — PROPOFOL 10 MG/ML IV BOLUS
INTRAVENOUS | Status: AC
  Filled 2016-12-15: qty 20

## 2016-12-15 MED ORDER — VITAMIN B COMPLEX-C PO CAPS: ORAL_CAPSULE | Freq: Every day | ORAL | Status: DC

## 2016-12-15 MED ORDER — SODIUM CHLORIDE 0.9 % IV SOLN
1.0000 mL/kg/h | INTRAVENOUS | Status: AC
  Administered 2016-12-15: 1 mL/kg/h via INTRAVENOUS

## 2016-12-15 MED ORDER — MORPHINE SULFATE (PF) 2 MG/ML IV SOLN: 2.0000 mg | INTRAVENOUS | Status: DC | PRN

## 2016-12-15 MED ORDER — SODIUM CHLORIDE 0.9% FLUSH
3.0000 mL | INTRAVENOUS | Status: DC | PRN
Start: 2016-12-15 — End: 2016-12-17
  Administered 2016-12-15: 3 mL via INTRAVENOUS
  Filled 2016-12-15: qty 3

## 2016-12-15 MED ORDER — ASPIRIN EC 81 MG PO TBEC
81.0000 mg | DELAYED_RELEASE_TABLET | Freq: Every day | ORAL | Status: DC
  Administered 2016-12-16 – 2016-12-17 (×2): 81 mg via ORAL
  Filled 2016-12-15 (×2): qty 1

## 2016-12-15 MED ORDER — PHENYLEPHRINE HCL 10 MG/ML IJ SOLN
0.0000 ug/min | INTRAMUSCULAR | Status: DC
  Filled 2016-12-15: qty 2

## 2016-12-15 MED ORDER — VANCOMYCIN HCL IN DEXTROSE 1-5 GM/200ML-% IV SOLN
1000.0000 mg | Freq: Once | INTRAVENOUS | Status: AC
  Administered 2016-12-15: 1000 mg via INTRAVENOUS
  Filled 2016-12-15: qty 200

## 2016-12-15 MED ORDER — OMEGA-3-ACID ETHYL ESTERS 1 G PO CAPS
1.0000 | ORAL_CAPSULE | Freq: Every day | ORAL | Status: DC
  Administered 2016-12-16: 1 g via ORAL
  Filled 2016-12-15: qty 1

## 2016-12-15 MED ORDER — SODIUM CHLORIDE 0.9% FLUSH
3.0000 mL | Freq: Two times a day (BID) | INTRAVENOUS | Status: DC
  Administered 2016-12-16: 3 mL via INTRAVENOUS

## 2016-12-15 MED ORDER — CLOPIDOGREL BISULFATE 75 MG PO TABS
75.0000 mg | ORAL_TABLET | Freq: Every day | ORAL | Status: DC
  Administered 2016-12-16 – 2016-12-17 (×2): 75 mg via ORAL
  Filled 2016-12-15 (×2): qty 1

## 2016-12-15 MED ORDER — FENTANYL CITRATE (PF) 100 MCG/2ML IJ SOLN
INTRAMUSCULAR | Status: DC | PRN
  Administered 2016-12-15: 25 ug via INTRAVENOUS

## 2016-12-15 MED ORDER — CEFUROXIME SODIUM 1.5 G IV SOLR
1.5000 g | Freq: Two times a day (BID) | INTRAVENOUS | Status: AC
  Administered 2016-12-15 – 2016-12-17 (×4): 1.5 g via INTRAVENOUS
  Filled 2016-12-15 (×6): qty 1.5

## 2016-12-15 MED ORDER — POTASSIUM CHLORIDE CRYS ER 20 MEQ PO TBCR
40.0000 meq | EXTENDED_RELEASE_TABLET | Freq: Once | ORAL | Status: AC
  Administered 2016-12-15: 40 meq via ORAL
  Filled 2016-12-15: qty 2

## 2016-12-15 MED ORDER — MIDAZOLAM HCL 2 MG/2ML IJ SOLN: 2.0000 mg | INTRAMUSCULAR | Status: DC | PRN

## 2016-12-15 MED ORDER — HEPARIN SODIUM (PORCINE) 5000 UNIT/ML IJ SOLN
INTRAMUSCULAR | Status: DC | PRN
  Administered 2016-12-15: 1500 mL

## 2016-12-15 MED ORDER — METOPROLOL TARTRATE 5 MG/5ML IV SOLN: 2.5000 mg | INTRAVENOUS | Status: DC | PRN

## 2016-12-15 MED ORDER — IRBESARTAN 150 MG PO TABS
300.0000 mg | ORAL_TABLET | Freq: Every day | ORAL | Status: DC
  Administered 2016-12-16 – 2016-12-17 (×2): 300 mg via ORAL
  Filled 2016-12-15: qty 2
  Filled 2016-12-15: qty 1

## 2016-12-15 MED ORDER — PROTAMINE SULFATE 10 MG/ML IV SOLN
INTRAVENOUS | Status: DC | PRN
  Administered 2016-12-15: 110 mg via INTRAVENOUS

## 2016-12-15 MED ORDER — FAMOTIDINE IN NACL 20-0.9 MG/50ML-% IV SOLN
20.0000 mg | Freq: Two times a day (BID) | INTRAVENOUS | Status: AC
  Administered 2016-12-15: 20 mg via INTRAVENOUS
  Filled 2016-12-15 (×2): qty 50

## 2016-12-15 MED ORDER — SODIUM CHLORIDE 0.9% FLUSH
10.0000 mL | Freq: Two times a day (BID) | INTRAVENOUS | Status: DC
  Administered 2016-12-15: 10 mL

## 2016-12-15 MED ORDER — CHLORHEXIDINE GLUCONATE CLOTH 2 % EX PADS: 6.0000 | MEDICATED_PAD | Freq: Every day | CUTANEOUS | Status: DC

## 2016-12-15 MED ORDER — IODIXANOL 320 MG/ML IV SOLN
INTRAVENOUS | Status: DC | PRN
  Administered 2016-12-15: 45.3 mL via INTRA_ARTERIAL

## 2016-12-15 MED ORDER — FUROSEMIDE 40 MG PO TABS
40.0000 mg | ORAL_TABLET | Freq: Every day | ORAL | Status: DC
  Administered 2016-12-16 – 2016-12-17 (×2): 40 mg via ORAL
  Filled 2016-12-15 (×2): qty 1

## 2016-12-15 MED ORDER — LACTATED RINGERS IV SOLN: 500.0000 mL | Freq: Once | INTRAVENOUS | Status: DC | PRN

## 2016-12-15 MED ORDER — MORPHINE SULFATE (PF) 4 MG/ML IV SOLN: 2.0000 mg | INTRAVENOUS | Status: DC | PRN

## 2016-12-15 MED ORDER — NITROGLYCERIN IN D5W 200-5 MCG/ML-% IV SOLN: 0.0000 ug/min | INTRAVENOUS | Status: DC

## 2016-12-15 MED ORDER — TRAMADOL HCL 50 MG PO TABS: 50.0000 mg | ORAL_TABLET | ORAL | Status: DC | PRN

## 2016-12-15 MED ORDER — LIDOCAINE HCL 1 % IJ SOLN
INTRAMUSCULAR | Status: DC | PRN
  Administered 2016-12-15: 8 mL

## 2016-12-15 MED ORDER — CARVEDILOL 6.25 MG PO TABS
6.2500 mg | ORAL_TABLET | Freq: Two times a day (BID) | ORAL | Status: DC
  Administered 2016-12-15 – 2016-12-17 (×4): 6.25 mg via ORAL
  Filled 2016-12-15 (×4): qty 1

## 2016-12-15 MED ORDER — SODIUM CHLORIDE 0.9% FLUSH: 10.0000 mL | INTRAVENOUS | Status: DC | PRN

## 2016-12-15 MED ORDER — FENTANYL CITRATE (PF) 250 MCG/5ML IJ SOLN
INTRAMUSCULAR | Status: AC
  Filled 2016-12-15: qty 5

## 2016-12-15 MED ORDER — HYDRALAZINE HCL 25 MG PO TABS
25.0000 mg | ORAL_TABLET | Freq: Three times a day (TID) | ORAL | Status: DC
  Administered 2016-12-15 – 2016-12-17 (×5): 25 mg via ORAL
  Filled 2016-12-15 (×5): qty 1

## 2016-12-15 MED ORDER — CHLORHEXIDINE GLUCONATE 0.12 % MT SOLN
15.0000 mL | OROMUCOSAL | Status: AC
  Administered 2016-12-15: 15 mL via OROMUCOSAL

## 2016-12-15 MED ORDER — AMLODIPINE BESYLATE 10 MG PO TABS
10.0000 mg | ORAL_TABLET | Freq: Every day | ORAL | Status: DC
  Administered 2016-12-16 – 2016-12-17 (×2): 10 mg via ORAL
  Filled 2016-12-15 (×2): qty 1

## 2016-12-15 MED ORDER — B COMPLEX-C PO TABS
1.0000 | ORAL_TABLET | Freq: Every day | ORAL | Status: DC
  Administered 2016-12-16: 1 via ORAL
  Filled 2016-12-15 (×2): qty 1

## 2016-12-15 MED ORDER — LIDOCAINE HCL (PF) 1 % IJ SOLN
INTRAMUSCULAR | Status: AC
  Filled 2016-12-15: qty 30

## 2016-12-15 MED ORDER — PROPOFOL 500 MG/50ML IV EMUL
INTRAVENOUS | Status: DC | PRN
  Administered 2016-12-15: 25 ug/kg/min via INTRAVENOUS

## 2016-12-15 MED ORDER — PROTAMINE SULFATE 10 MG/ML IV SOLN
INTRAVENOUS | Status: AC
  Filled 2016-12-15: qty 25

## 2016-12-15 MED ORDER — HEPARIN SODIUM (PORCINE) 1000 UNIT/ML IJ SOLN
INTRAMUSCULAR | Status: DC | PRN
  Administered 2016-12-15: 11000 [IU] via INTRAVENOUS

## 2016-12-15 MED FILL — Potassium Chloride Inj 2 mEq/ML: INTRAVENOUS | Qty: 80 | Status: CN

## 2016-12-15 MED FILL — Magnesium Sulfate Inj 50%: INTRAMUSCULAR | Qty: 10 | Status: AC

## 2016-12-15 MED FILL — Heparin Sodium (Porcine) Inj 1000 Unit/ML: INTRAMUSCULAR | Qty: 30 | Status: AC

## 2016-12-15 SURGICAL SUPPLY — 108 items
ADAPTER UNIV SWAN GANZ BIP (ADAPTER) ×2 IMPLANT
ADAPTER UNV SWAN GANZ BIP (ADAPTER) ×1
ADH SKN CLS APL DERMABOND .7 (GAUZE/BANDAGES/DRESSINGS) ×2
ADPR CATH UNV NS SG CATH (ADAPTER) ×2
ATTRACTOMAT 16X20 MAGNETIC DRP (DRAPES) IMPLANT
BAG BANDED W/RUBBER/TAPE 36X54 (MISCELLANEOUS) ×3 IMPLANT
BAG DECANTER FOR FLEXI CONT (MISCELLANEOUS) IMPLANT
BAG EQP BAND 135X91 W/RBR TAPE (MISCELLANEOUS) ×2
BAG SNAP BAND KOVER 36X36 (MISCELLANEOUS) ×6 IMPLANT
BLADE 10 SAFETY STRL DISP (BLADE) ×3 IMPLANT
BLADE CLIPPER SURG (BLADE) ×1 IMPLANT
BLADE STERNUM SYSTEM 6 (BLADE) ×3 IMPLANT
CABLE ADAPT CONN TEMP 6FT (ADAPTER) ×3 IMPLANT
CABLE PACING FASLOC BIEGE (MISCELLANEOUS) ×3 IMPLANT
CABLE PACING FASLOC BLUE (MISCELLANEOUS) ×3 IMPLANT
CANISTER SUCT 3000ML PPV (MISCELLANEOUS) ×1 IMPLANT
CANNULA FEM VENOUS REMOTE 22FR (CANNULA) IMPLANT
CANNULA OPTISITE PERFUSION 16F (CANNULA) IMPLANT
CANNULA OPTISITE PERFUSION 18F (CANNULA) IMPLANT
CATH DIAG EXPO 6F VENT PIG 145 (CATHETERS) ×6 IMPLANT
CATH EXPO 5FR AL1 (CATHETERS) ×3 IMPLANT
CATH S G BIP PACING (SET/KITS/TRAYS/PACK) ×6 IMPLANT
CLIP VESOCCLUDE MED 24/CT (CLIP) ×2 IMPLANT
CLIP VESOCCLUDE SM WIDE 24/CT (CLIP) ×2 IMPLANT
CONT SPEC 4OZ CLIKSEAL STRL BL (MISCELLANEOUS) ×6 IMPLANT
COVER BACK TABLE 60X90IN (DRAPES) ×3 IMPLANT
COVER BACK TABLE 80X110 HD (DRAPES) ×3 IMPLANT
COVER DOME SNAP 22 D (MISCELLANEOUS) ×3 IMPLANT
COVER MAYO STAND STRL (DRAPES) ×3 IMPLANT
COVER TRANSDUCER ULTRASND GEL (DRAPE) ×1 IMPLANT
CRADLE DONUT ADULT HEAD (MISCELLANEOUS) ×3 IMPLANT
DERMABOND ADVANCED (GAUZE/BANDAGES/DRESSINGS) ×1
DERMABOND ADVANCED .7 DNX12 (GAUZE/BANDAGES/DRESSINGS) ×2 IMPLANT
DEVICE CLOSURE PERCLS PRGLD 6F (VASCULAR PRODUCTS) ×4 IMPLANT
DRAPE INCISE IOBAN 66X45 STRL (DRAPES) IMPLANT
DRAPE SLUSH MACHINE 52X66 (DRAPES) ×3 IMPLANT
DRSG TEGADERM 4X4.75 (GAUZE/BANDAGES/DRESSINGS) ×3 IMPLANT
ELECT REM PT RETURN 9FT ADLT (ELECTROSURGICAL) ×6
ELECTRODE REM PT RTRN 9FT ADLT (ELECTROSURGICAL) ×4 IMPLANT
FELT TEFLON 6X6 (MISCELLANEOUS) ×3 IMPLANT
FEMORAL VENOUS CANN RAP (CANNULA) IMPLANT
GAUZE SPONGE 4X4 12PLY STRL (GAUZE/BANDAGES/DRESSINGS) ×3 IMPLANT
GAUZE SPONGE 4X4 12PLY STRL LF (GAUZE/BANDAGES/DRESSINGS) ×1 IMPLANT
GLOVE BIO SURGEON STRL SZ7.5 (GLOVE) ×3 IMPLANT
GLOVE BIO SURGEON STRL SZ8 (GLOVE) ×6 IMPLANT
GLOVE BIOGEL PI IND STRL 6.5 (GLOVE) IMPLANT
GLOVE BIOGEL PI INDICATOR 6.5 (GLOVE) ×5
GLOVE EUDERMIC 7 POWDERFREE (GLOVE) ×3 IMPLANT
GLOVE ORTHO TXT STRL SZ7.5 (GLOVE) ×3 IMPLANT
GOWN STRL REUS W/ TWL LRG LVL3 (GOWN DISPOSABLE) ×6 IMPLANT
GOWN STRL REUS W/ TWL XL LVL3 (GOWN DISPOSABLE) ×12 IMPLANT
GOWN STRL REUS W/TWL LRG LVL3 (GOWN DISPOSABLE) ×9
GOWN STRL REUS W/TWL XL LVL3 (GOWN DISPOSABLE) ×18
GUIDEWIRE SAF TJ AMPL .035X180 (WIRE) ×3 IMPLANT
GUIDEWIRE SAFE TJ AMPLATZ EXST (WIRE) ×3 IMPLANT
GUIDEWIRE STRAIGHT .035 260CM (WIRE) ×3 IMPLANT
INSERT FOGARTY 61MM (MISCELLANEOUS) ×3 IMPLANT
INSERT FOGARTY SM (MISCELLANEOUS) IMPLANT
INSERT FOGARTY XLG (MISCELLANEOUS) IMPLANT
KIT BASIN OR (CUSTOM PROCEDURE TRAY) ×3 IMPLANT
KIT DILATOR VASC 18G NDL (KITS) IMPLANT
KIT HEART LEFT (KITS) ×3 IMPLANT
KIT ROOM TURNOVER OR (KITS) ×3 IMPLANT
KIT SUCTION CATH 14FR (SUCTIONS) ×6 IMPLANT
NDL PERC 18GX7CM (NEEDLE) ×2 IMPLANT
NEEDLE 22X1 1/2 (OR ONLY) (NEEDLE) ×1 IMPLANT
NEEDLE PERC 18GX7CM (NEEDLE) ×3 IMPLANT
NS IRRIG 1000ML POUR BTL (IV SOLUTION) ×9 IMPLANT
PACK AORTA (CUSTOM PROCEDURE TRAY) ×3 IMPLANT
PAD ARMBOARD 7.5X6 YLW CONV (MISCELLANEOUS) ×6 IMPLANT
PAD ELECT DEFIB RADIOL ZOLL (MISCELLANEOUS) ×3 IMPLANT
PATCH TACHOSII LRG 9.5X4.8 (VASCULAR PRODUCTS) IMPLANT
PERCLOSE PROGLIDE 6F (VASCULAR PRODUCTS) ×12
SET MICROPUNCTURE 5F STIFF (MISCELLANEOUS) ×3 IMPLANT
SHEATH AVANTI 11CM 8FR (MISCELLANEOUS) ×3 IMPLANT
SHEATH PINNACLE 6F 10CM (SHEATH) ×6 IMPLANT
SLEEVE REPOSITIONING LENGTH 30 (MISCELLANEOUS) ×3 IMPLANT
SPONGE LAP 4X18 X RAY DECT (DISPOSABLE) ×3 IMPLANT
STOPCOCK MORSE 400PSI 3WAY (MISCELLANEOUS) ×18 IMPLANT
SUT ETHIBOND X763 2 0 SH 1 (SUTURE) IMPLANT
SUT GORETEX CV 4 TH 22 36 (SUTURE) IMPLANT
SUT GORETEX CV4 TH-18 (SUTURE) IMPLANT
SUT GORETEX TH-18 36 INCH (SUTURE) IMPLANT
SUT MNCRL AB 3-0 PS2 18 (SUTURE) IMPLANT
SUT PROLENE 3 0 SH1 36 (SUTURE) IMPLANT
SUT PROLENE 4 0 RB 1 (SUTURE)
SUT PROLENE 4-0 RB1 .5 CRCL 36 (SUTURE) IMPLANT
SUT PROLENE 5 0 C 1 36 (SUTURE) IMPLANT
SUT PROLENE 6 0 C 1 30 (SUTURE) IMPLANT
SUT SILK  1 MH (SUTURE) ×1
SUT SILK 1 MH (SUTURE) ×2 IMPLANT
SUT SILK 2 0 SH CR/8 (SUTURE) IMPLANT
SUT VIC AB 2-0 CT1 27 (SUTURE)
SUT VIC AB 2-0 CT1 TAPERPNT 27 (SUTURE) IMPLANT
SUT VIC AB 2-0 CTX 36 (SUTURE) IMPLANT
SUT VIC AB 3-0 SH 8-18 (SUTURE) IMPLANT
SYR 10ML LL (SYRINGE) ×9 IMPLANT
SYR 30ML LL (SYRINGE) ×6 IMPLANT
SYR 50ML LL SCALE MARK (SYRINGE) ×3 IMPLANT
SYR CONTROL 10ML LL (SYRINGE) ×1 IMPLANT
TOWEL OR 17X26 10 PK STRL BLUE (TOWEL DISPOSABLE) ×6 IMPLANT
TRANSDUCER W/STOPCOCK (MISCELLANEOUS) ×6 IMPLANT
TRAY FOLEY SILVER 14FR TEMP (SET/KITS/TRAYS/PACK) ×3 IMPLANT
TUBE SUCT INTRACARD DLP 20F (MISCELLANEOUS) IMPLANT
TUBING HIGH PRESSURE 120CM (CONNECTOR) ×3 IMPLANT
VALVE HEART TRANSCATH SZ3 26MM (Prosthesis & Implant Heart) ×1 IMPLANT
WIRE AMPLATZ SS-J .035X180CM (WIRE) ×3 IMPLANT
WIRE BENTSON .035X145CM (WIRE) ×3 IMPLANT

## 2016-12-15 NOTE — Interval H&P Note (Signed)
History and Physical Interval Note:  12/15/2016 7:02 AM  Robert Horn  has presented today for surgery, with the diagnosis of Severe Aortic Stenosis  The various methods of treatment have been discussed with the patient and family. After consideration of risks, benefits and other options for treatment, the patient has consented to  Procedure(s): TRANSCATHETER AORTIC VALVE REPLACEMENT, TRANSFEMORAL (N/A) TRANSESOPHAGEAL ECHOCARDIOGRAM (TEE) (N/A) as a surgical intervention .  The patient's history has been reviewed, patient examined, no change in status, stable for surgery.  I have reviewed the patient's chart and labs.  Questions were answered to the patient's satisfaction.     Gaye Pollack

## 2016-12-15 NOTE — Anesthesia Procedure Notes (Signed)
Procedure Name: MAC Date/Time: 12/15/2016 7:40 AM Performed by: Barrington Ellison Pre-anesthesia Checklist: Patient identified, Emergency Drugs available, Suction available and Patient being monitored Patient Re-evaluated:Patient Re-evaluated prior to induction Oxygen Delivery Method: Simple face mask

## 2016-12-15 NOTE — Anesthesia Procedure Notes (Signed)
Central Venous Catheter Insertion Performed by: Oleta Mouse, anesthesiologist Start/End9/18/2018 7:02 AM, 12/15/2016 7:11 AM Patient location: Pre-op. Preanesthetic checklist: patient identified, IV checked, site marked, risks and benefits discussed, surgical consent, monitors and equipment checked, pre-op evaluation, timeout performed and anesthesia consent Lidocaine 1% used for infiltration and patient sedated Hand hygiene performed  and maximum sterile barriers used  Catheter size: 8 Fr Total catheter length 16. Central line was placed.Double lumen Procedure performed using ultrasound guided technique. Ultrasound Notes:anatomy identified, needle tip was noted to be adjacent to the nerve/plexus identified, no ultrasound evidence of intravascular and/or intraneural injection and image(s) printed for medical record Attempts: 1 Following insertion, dressing applied, line sutured and Biopatch. Post procedure assessment: blood return through all ports, free fluid flow and no air  Patient tolerated the procedure well with no immediate complications.

## 2016-12-15 NOTE — Transfer of Care (Signed)
Immediate Anesthesia Transfer of Care Note  Patient: Robert Horn  Procedure(s) Performed: Procedure(s): TRANSCATHETER AORTIC VALVE REPLACEMENT, TRANSFEMORAL (N/A) TRANSESOPHAGEAL ECHOCARDIOGRAM (TEE) (N/A)  Patient Location: PACU  Anesthesia Type:MAC  Level of Consciousness: drowsy  Airway & Oxygen Therapy: Patient Spontanous Breathing and Patient connected to nasal cannula oxygen  Post-op Assessment: Report given to RN  Post vital signs: Reviewed and stable  Last Vitals:  Vitals:   12/15/16 0718 12/15/16 0902  BP:    Pulse: 63 (!) 53  Resp: 12   Temp:    SpO2: 100%     Last Pain:  Vitals:   12/15/16 0600  TempSrc: Oral         Complications: No apparent anesthesia complications

## 2016-12-15 NOTE — Anesthesia Preprocedure Evaluation (Addendum)
Anesthesia Evaluation  Patient identified by MRN, date of birth, ID band Patient awake    Reviewed: Allergy & Precautions, NPO status , Patient's Chart, lab work & pertinent test results, reviewed documented beta blocker date and time   Airway Mallampati: III  TM Distance: >3 FB Neck ROM: Full    Dental  (+) Teeth Intact, Partial Upper, Partial Lower, Loose, Poor Dentition, Dental Advisory Given,    Pulmonary shortness of breath and with exertion, former smoker,    breath sounds clear to auscultation       Cardiovascular hypertension, Pt. on medications (-) angina+CHF  (-) CAD + Valvular Problems/Murmurs AS  Rhythm:Regular Rate:Normal + Systolic murmurs    Neuro/Psych negative neurological ROS  negative psych ROS   GI/Hepatic negative GI ROS, Neg liver ROS,   Endo/Other  negative endocrine ROS  Renal/GU CRFRenal disease     Musculoskeletal   Abdominal   Peds  Hematology  (+) anemia ,   Anesthesia Other Findings EF 30%  Reproductive/Obstetrics                           Anesthesia Physical Anesthesia Plan  ASA: IV  Anesthesia Plan: MAC   Post-op Pain Management:    Induction:   PONV Risk Score and Plan: 1 and Ondansetron  Airway Management Planned: Nasal Cannula  Additional Equipment: Arterial line, CVP and Ultrasound Guidance Line Placement  Intra-op Plan:   Post-operative Plan:   Informed Consent: I have reviewed the patients History and Physical, chart, labs and discussed the procedure including the risks, benefits and alternatives for the proposed anesthesia with the patient or authorized representative who has indicated his/her understanding and acceptance.   Dental advisory given  Plan Discussed with: CRNA and Surgeon  Anesthesia Plan Comments:        Anesthesia Quick Evaluation

## 2016-12-15 NOTE — Progress Notes (Signed)
  Echocardiogram 2D Echocardiogram has been performed.  Bobbye Charleston 12/15/2016, 9:26 AM

## 2016-12-15 NOTE — Anesthesia Procedure Notes (Addendum)
Arterial Line Insertion Start/End9/18/2018 6:40 AM, 12/15/2016 6:45 AM Performed by: Barrington Ellison, CRNA  Patient location: Pre-op. Lidocaine 1% used for infiltration and patient sedated Right, radial was placed Catheter size: 20 G Hand hygiene performed  and maximum sterile barriers used   Attempts: 1 Procedure performed without using ultrasound guided technique. Following insertion, dressing applied and Biopatch. Post procedure assessment: normal  Additional procedure comments: Performed by Abner Greenspan, SRNA under direct supervision from CRNA.

## 2016-12-15 NOTE — Progress Notes (Addendum)
6 french sheath removed from left femoral artery and 6 french sheath removed from left femoral vein.  Pressure held x 20 minutes.  Vitals stable during sheath removal.  Site looks good with no hematoma level 0.  Site dressed with 4x4 and tegaderm.  Distal pulses present.  Bedrest instructions given and RN will continue to monitor site.

## 2016-12-15 NOTE — Progress Notes (Signed)
  [place] VALVE TEAM   Patient doing well after TAVR. Tele with sinus brady HR high 40s-50s with first degree AV block (similar to previous tracings) and frequent ventricular ectopy. Groin sites stable. K 3.3. Will supplement K now. Will remove A line. Early ambulation with plans to transfer to the floor tomorrow.    Angelena Form PA-C  [facility]

## 2016-12-15 NOTE — Anesthesia Postprocedure Evaluation (Signed)
Anesthesia Post Note  Patient: Robert Horn  Procedure(s) Performed: Procedure(s) (LRB): TRANSCATHETER AORTIC VALVE REPLACEMENT, TRANSFEMORAL (N/A) TRANSESOPHAGEAL ECHOCARDIOGRAM (TEE) (N/A)     Patient location during evaluation: PACU Anesthesia Type: MAC Level of consciousness: awake and alert Pain management: pain level controlled Vital Signs Assessment: post-procedure vital signs reviewed and stable Respiratory status: spontaneous breathing, nonlabored ventilation, respiratory function stable and patient connected to nasal cannula oxygen Cardiovascular status: stable Postop Assessment: no apparent nausea or vomiting Anesthetic complications: no    Last Vitals:  Vitals:   12/15/16 1100 12/15/16 1115  BP:    Pulse: (!) 39 (!) 43  Resp: 14 15  Temp:    SpO2: 100% 100%    Last Pain:  Vitals:   12/15/16 0600  TempSrc: Oral                 Zehava Turski

## 2016-12-15 NOTE — Progress Notes (Signed)
[facility] BRIEF SICU PROGRESS NOTE  Day of Surgery  S/P Procedure(s) (LRB): TRANSCATHETER AORTIC VALVE REPLACEMENT, TRANSFEMORAL (N/A) TRANSESOPHAGEAL ECHOCARDIOGRAM (TEE) (N/A)   Doing very well NSR w/ stable BP Both groins look okay  Plan: Continue routine post TAVR  Rexene Alberts, MD 12/15/2016 6:20 PM

## 2016-12-15 NOTE — Op Note (Signed)
[facility]   MULTIDISCIPLINARY HEART VALVE TEAM   TAVR OPERATIVE NOTE   Date of Procedure:  12/15/2016  Preoperative Diagnosis: Severe Aortic Stenosis   Postoperative Diagnosis: Same   Procedure:    Transcatheter Aortic Valve Replacement - Percutaneous Right Transfemoral Approach  Edwards Sapien 3 THV (size 26 mm, model # 9600TFX, serial # [id number])   Co-Surgeons:  Gaye Pollack, MD and Sherren Mocha, MD   Anesthesiologist:  Laurie Panda, MD  Echocardiographer:  Jenkins Rouge, MD  Pre-operative Echo Findings:  Severe aortic stenosis   Moderate left ventricular systolic dysfunction  Post-operative Echo Findings:  No paravalvular leak  Unchanged moderate left ventricular systolic dysfunction   BRIEF CLINICAL NOTE AND INDICATIONS FOR SURGERY  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 [name] for moderate aortic stenosis and chronic diastolic heart failure. He has done well overall and continues to work but reports an episode about a year ago when he ran out of his antihypertensive medication for about 10 days and was hospitalized with acute heart failure. An echo at that time on 10/27/2015 showed an EF of 40-45%. The mean aortic valve gradient was 30 mm Hg with severely calcified and thickened leaflets. A cath on 11/14/2015 showed widely patent coronary arteries with an EF of 30-35% with elevated EDP of 31. The mean AV gradient was 18.2 mm Hg with an AVA calculated at 1.73 cm2. He has been continued on medical therapy for his hypertension and diastolic heart failure and says that he feels well but gets short of breath and fatigued with exertion so he tries not to do activities that cause symptoms like walking up hills. He has stairs in his house but goes up very slowly. He denies orthopnea and PND. He denies dizziness and syncope, has no chest pressure or pain  but does have some ankle swelling. He recently had a repeat echo on 10/08/2016 that showed progression of his AS with a mean gradient of 32 mm Hg with a peak of 44 mm Hg. The peak velocity ratio is 0.23. The LVEF was severely reduced with an EF of 25-30% with doppler parameters consistent with restrictive physiology. The calculated AVR is 0.8 cm2.  He has stage D, severe, symptomatic aortic stenosis with NYHA class III symptoms of exertional shortness of breath and fatigue. He and his wife say that he has reduced his physical activity to avoid symptoms. His LVEF has deteriorated over the past year from 40-45% last July to 25-30% now. His gradient has increased despite the reduction in his EF. I have personally reviewed his echo, cath and CTA studies. His aortic valve is trileaflet with severely calcified and thickened leaflets with reduced mobility. His mean gradient is 32 mm Hg with a DI of 0.23 and an indexed valve area of 0.33 cm2/m2. He has no coronary disease. I agree that AVR is indicated to improve his exertional tolerance and to prevent further deterioration in his LV function. I think he would be at moderate risk for open surgical AVR due to his age and LV function. I think TAVR would be a better option for him with less risk and a much quicker recovery. His gated cardiac CT shows anatomy favorable for TAVR using a 26 mm Sapien 3 valve. His abdominal and pelvic CT shows pelvic vasculature suitable for transfemoral insertion.  The patient and his wife werecounseled at length regarding  treatment alternatives for management of severe symptomatic aortic stenosis. The risks and benefits of surgical intervention has been discussed in detail. Long-term prognosis with medical therapy was discussed. Alternative approaches such as conventional surgical aortic valve replacement, transcatheter aortic valve replacement, and palliative medical therapy were compared and contrasted at length. This discussion was  placed in the context of the patient's own specific clinical presentation and past medical history. All of their questions been addressed.   Following the decision to proceed with transcatheter aortic valve replacement, a discussion was held regarding what types of management strategies would be attempted intraoperatively in the event of life-threatening complications, including whether or not the patient would be considered a candidate for the use of cardiopulmonary bypass and/or conversion to open sternotomy for attempted surgical intervention. The patient has been advised of a variety of complications that might develop including but not limited to risks of death, stroke, paravalvular leak, aortic dissection or other major vascular complications, aortic annulus rupture, device embolization, cardiac rupture or perforation, mitral regurgitation, acute myocardial infarction, arrhythmia, heart block or bradycardia requiring permanent pacemaker placement, congestive heart failure, respiratory failure, renal failure, pneumonia, infection, other late complications related to structural valve deterioration or migration, or other complications that might ultimately cause a temporary or permanent loss of functional independence or other long term morbidity. The patient provides full informed consent for the procedure as described and all questions were answered.      DETAILS OF THE OPERATIVE PROCEDURE  PREPARATION:    The patient is brought to the operating room on the above mentioned date and central monitoring was established by the anesthesia team including placement of a central venous line and radial arterial line. The patient is placed in the supine position on the operating table.  Intravenous antibiotics are administered. The patient is monitored closely throughout the procedure under conscious sedation. Baseline transthoracic echocardiogram was performed. The patient's abdomen, both groins, and  both lower extremities are prepared and draped in a sterile manner. A time out procedure is performed.   PERIPHERAL ACCESS:    Using the modified Seldinger technique, femoral arterial and venous access was obtained with placement of 6 Fr sheaths on the left side.  A pigtail diagnostic catheter was passed through the left arterial sheath under fluoroscopic guidance into the aortic root.  A temporary transvenous pacemaker catheter was passed through the left femoral venous sheath under fluoroscopic guidance into the right ventricle.  The pacemaker was tested to ensure stable lead placement and pacemaker capture. Aortic root angiography was performed in order to determine the optimal angiographic angle for valve deployment.   TRANSFEMORAL ACCESS:   Percutaneous transfemoral access and sheath placement was performed by Dr. [name] using ultrasound guidance.  The right common femoral artery was cannulated using a micropuncture needle and appropriate location was verified using hand injection angiogram.  A pair of Abbott Perclose percutaneous closure devices were placed and a 6 French sheath replaced into the femoral artery.  The patient was heparinized systemically and ACT verified > 250 seconds.    A 14 Fr transfemoral E-sheath was introduced into the right femoral artery after progressively dilating over an Amplatz superstiff wire. An AL-2 catheter was used to direct a straight-tip exchange length wire across the native aortic valve into the left ventricle. This was exchanged out for a pigtail catheter and position was confirmed in the LV apex. Simultaneous LV and Ao pressures were recorded.  The pigtail catheter was exchanged for an Amplatz Extra-stiff wire in the  LV apex.  Echocardiography was utilized to confirm appropriate wire position and no sign of entanglement in the mitral subvalvular apparatus.   BALLOON AORTIC VALVULOPLASTY:   Not performed  TRANSCATHETER HEART VALVE DEPLOYMENT:   An  Edwards Sapien 3 transcatheter heart valve (size 26 mm, model #9600TFX, serial #[id number]) was prepared and crimped per manufacturer's guidelines, and the proper orientation of the valve is confirmed on the [employer] delivery system. The valve was advanced through the introducer sheath using normal technique until in an appropriate position in the abdominal aorta beyond the sheath tip. The balloon was then retracted and using the fine-tuning wheel was centered on the valve. The valve was then advanced across the aortic arch using appropriate flexion of the catheter. The valve was carefully positioned across the aortic valve annulus. The Commander catheter was retracted using normal technique. Once final position of the valve has been confirmed by angiographic assessment, the valve is deployed while temporarily holding ventilation and during rapid ventricular pacing to maintain systolic blood pressure < 50 mmHg and pulse pressure < 10 mmHg. The balloon inflation is held for >3 seconds after reaching full deployment volume. Once the balloon has fully deflated the balloon is retracted into the ascending aorta and valve function is assessed using echocardiography. There is felt to be no paravalvular leak and no central aortic insufficiency.  The patient's hemodynamic recovery following valve deployment is good.  The deployment balloon and guidewire are both removed.    PROCEDURE COMPLETION:   The sheath was removed and femoral artery closure performed by Dr [name].  Protamine was administered once femoral arterial repair was complete. The temporary pacemaker, pigtail catheters and femoral sheaths were removed with manual pressure used for hemostasis.   The patient tolerated the procedure well and is transported to the surgical intensive care in stable condition. There were no immediate intraoperative complications. All sponge instrument and needle counts are verified correct at completion of the operation.     No blood products were administered during the operation.  The patient received a total of 40 mL of intravenous contrast during the procedure.   Gaye Pollack, MD 12/15/2016

## 2016-12-15 NOTE — Op Note (Signed)
[facility]   MULTIDISCIPLINARY HEART VALVE TEAM   TAVR OPERATIVE NOTE   Date of Procedure:  12/15/2016  Preoperative Diagnosis: Severe Aortic Stenosis   Postoperative Diagnosis: Same   Procedure:    Transcatheter Aortic Valve Replacement - Percutaneous Transfemoral Approach  Edwards Sapien 3 THV (size 26 mm, model # 9600TFX, serial # [id number])   Co-Surgeons:  Gaye Pollack, MD and Sherren Mocha, MD  Anesthesiologist:  Laurie Panda, MD  Echocardiographer:  Jenkins Rouge, MD  Pre-operative Echo Findings:  Severe aortic stenosis  Moderately depressed left ventricular systolic function  Post-operative Echo Findings:  No paravalvular leak  unchanged left ventricular systolic function  BRIEF CLINICAL NOTE AND INDICATIONS FOR SURGERY  This is an 83 year old gentleman who has been followed for aortic stenosis for several years. Over the past few years he has developed progressive LV systolic dysfunction and findings suggestive of progressive and now severe aortic stenosis based on echo criteria.  During the course of the patient's preoperative work up they have been evaluated comprehensively by a multidisciplinary team of specialists coordinated through the [facility] in the [facility].  They have been demonstrated to suffer from symptomatic severe aortic stenosis as noted above. The patient has been counseled extensively as to the relative risks and benefits of all options for the treatment of severe aortic stenosis including long term medical therapy, conventional surgery for aortic valve replacement, and transcatheter aortic valve replacement.  The patient has been independently evaluated by two cardiac surgeons including Dr. Roxy Manns and Dr. Cyndia Bent, and they are felt to be at moderate risk for conventional surgical aortic valve replacement based upon a predicted risk of mortality using the Society of Thoracic  Surgeons risk calculator of 3.0%. Both surgeons indicated the patient would be a poor candidate for conventional surgery because of comorbidities including advanced age and severe LV systolic dysfunction.   Based upon review of all of the patient's preoperative diagnostic tests they are felt to be candidate for transcatheter aortic valve replacement using the transfemoral approach as an alternative to high risk conventional surgery.    Following the decision to proceed with transcatheter aortic valve replacement, a discussion has been held regarding what types of management strategies would be attempted intraoperatively in the event of life-threatening complications, including whether or not the patient would be considered a candidate for the use of cardiopulmonary bypass and/or conversion to open sternotomy for attempted surgical intervention.  The patient has been advised of a variety of complications that might develop peculiar to this approach including but not limited to risks of death, stroke, paravalvular leak, aortic dissection or other major vascular complications, aortic annulus rupture, device embolization, cardiac rupture or perforation, acute myocardial infarction, arrhythmia, heart block or bradycardia requiring permanent pacemaker placement, congestive heart failure, respiratory failure, renal failure, pneumonia, infection, other late complications related to structural valve deterioration or migration, or other complications that might ultimately cause a temporary or permanent loss of functional independence or other long term morbidity.  The patient provides full informed consent for the procedure as described and all questions were answered preoperatively.  DETAILS OF THE OPERATIVE PROCEDURE  PREPARATION:   The patient is brought to the operating room on the above mentioned date and central monitoring was established by the anesthesia team including placement of a central venous catheter and  radial arterial line. The patient is placed in the supine position on the operating table.  Intravenous antibiotics are administered. The  patient is monitored closely throughout the procedure under conscious sedation. A Foley catheter is placed.  Baseline transthoracic echocardiogram is performed. The patient's chest, abdomen, both groins, and both lower extremities are prepared and draped in a sterile manner. A time out procedure is performed.   PERIPHERAL ACCESS:   Using ultrasound guidance, femoral arterial and venous access is obtained with placement of 6 Fr sheaths on the left side.  A pigtail diagnostic catheter was passed through the femoral arterial sheath under fluoroscopic guidance into the aortic root.  A temporary transvenous pacemaker catheter was passed through the femoral venous sheath under fluoroscopic guidance into the right ventricle.  The pacemaker was tested to ensure stable lead placement and pacemaker capture. Aortic root angiography was performed in order to determine the optimal angiographic angle for valve deployment.  TRANSFEMORAL ACCESS:  A micropuncture technique is used to access the right femoral artery under fluoroscopic and ultrasound guidance.  2 Perclose devices are deployed at 10' and 2' positions to 'PreClose' the femoral artery. An 8 French sheath is placed and then an Amplatz Superstiff wire is advanced through the sheath. This is changed out for a 14 French transfemoral E-Sheath after progressively dilating over the Superstiff wire.  An AL-2 catheter was used to direct a straight-tip exchange length wire across the native aortic valve into the left ventricle. This was exchanged out for a pigtail catheter and position was confirmed in the LV apex. Simultaneous LV and Ao pressures were recorded.  The pigtail catheter was exchanged for an Amplatz Extra-stiff wire in the LV apex.  Echocardiography was utilized to confirm appropriate wire position and no sign of  entanglement in the mitral subvalvular apparatus.  TRANSCATHETER HEART VALVE DEPLOYMENT:  An Edwards Sapien 3 transcatheter heart valve (size 23 mm, model #9600TFX, serial #[id number]) was prepared and crimped per manufacturer's guidelines, and the proper orientation of the valve is confirmed on the [employer] delivery system. The valve was advanced through the introducer sheath using normal technique until in an appropriate position in the abdominal aorta beyond the sheath tip. The balloon was then retracted and using the fine-tuning wheel was centered on the valve. The valve was then advanced across the aortic arch using appropriate flexion of the catheter. The valve was carefully positioned across the aortic valve annulus. The Commander catheter was retracted using normal technique. Once final position of the valve has been confirmed by angiographic assessment, the valve is deployed while temporarily holding ventilation and during rapid ventricular pacing to maintain systolic blood pressure < 50 mmHg and pulse pressure < 10 mmHg. The balloon inflation is held for >3 seconds after reaching full deployment volume. Once the balloon has fully deflated the balloon is retracted into the ascending aorta and valve function is assessed using echocardiography. There is felt to be no paravalvular leak and no central aortic insufficiency.  The patient's hemodynamic recovery following valve deployment is good.  The deployment balloon and guidewire are both removed. Echo demostrated acceptable post-procedural gradients, stable mitral valve function, and no aortic insufficiency.   PROCEDURE COMPLETION:  The sheath was removed and femoral artery closure is performed using the 2 previously deployed Perclose devices.  Protamine is administered once femoral arterial repair was complete. The site is clear with no evidence of bleeding or hematoma after the sutures are tightened. The temporary pacemaker, pigtail catheters  and femoral sheaths were removed with manual pressure used for hemostasis.   The patient tolerated the procedure well and is transported to the surgical  intensive care in stable condition. There were no immediate intraoperative complications. All sponge instrument and needle counts are verified correct at completion of the operation.   The patient received a total of 40 mL of intravenous contrast during the procedure.   Sherren Mocha, MD 12/15/2016 10:12 AM

## 2016-12-16 ENCOUNTER — Encounter (HOSPITAL_COMMUNITY): Payer: Self-pay | Admitting: Cardiovascular Disease

## 2016-12-16 ENCOUNTER — Other Ambulatory Visit: Payer: Self-pay

## 2016-12-16 ENCOUNTER — Inpatient Hospital Stay (HOSPITAL_COMMUNITY): Payer: Medicare Other

## 2016-12-16 DIAGNOSIS — I350 Nonrheumatic aortic (valve) stenosis: Secondary | ICD-10-CM

## 2016-12-16 DIAGNOSIS — Z952 Presence of prosthetic heart valve: Secondary | ICD-10-CM

## 2016-12-16 DIAGNOSIS — I361 Nonrheumatic tricuspid (valve) insufficiency: Secondary | ICD-10-CM

## 2016-12-16 LAB — CBC
HCT: 33.6 % — ABNORMAL LOW (ref 39.0–52.0)
Hemoglobin: 10.6 g/dL — ABNORMAL LOW (ref 13.0–17.0)
MCH: 27.8 pg (ref 26.0–34.0)
MCHC: 31.5 g/dL (ref 30.0–36.0)
MCV: 88.2 fL (ref 78.0–100.0)
PLATELETS: 155 10*3/uL (ref 150–400)
RBC: 3.81 MIL/uL — ABNORMAL LOW (ref 4.22–5.81)
RDW: 13.7 % (ref 11.5–15.5)
WBC: 6.8 10*3/uL (ref 4.0–10.5)

## 2016-12-16 LAB — ECHOCARDIOGRAM COMPLETE
HEIGHTINCHES: 72 in
Weight: 3680 oz

## 2016-12-16 LAB — BASIC METABOLIC PANEL
ANION GAP: 6 (ref 5–15)
BUN: 15 mg/dL (ref 6–20)
CALCIUM: 8.5 mg/dL — AB (ref 8.9–10.3)
CO2: 25 mmol/L (ref 22–32)
Chloride: 107 mmol/L (ref 101–111)
Creatinine, Ser: 1.2 mg/dL (ref 0.61–1.24)
GFR, EST NON AFRICAN AMERICAN: 55 mL/min — AB (ref >60)
Glucose, Bld: 104 mg/dL — ABNORMAL HIGH (ref 65–99)
Potassium: 4.2 mmol/L (ref 3.5–5.1)
Sodium: 138 mmol/L (ref 135–145)

## 2016-12-16 LAB — MAGNESIUM: Magnesium: 2 mg/dL (ref 1.7–2.4)

## 2016-12-16 NOTE — Plan of Care (Signed)
Problem: Health Behavior/Discharge Planning: Goal: Ability to manage health-related needs will improve Outcome: Progressing Pt able to verbalize health plan at discharge and changes that will need to be made to activity following his procedure. Patient is very knowledgeable about his health and active in his health care plan.   Problem: Tissue Perfusion: Goal: Risk factors for ineffective tissue perfusion will decrease Outcome: Progressing SCDs in place.

## 2016-12-16 NOTE — Progress Notes (Signed)
[place] VALVE TEAM  Patient Name: Robert Horn Date of Encounter: 12/16/2016  Primary Cardiologist: Dr. [name] / Dr. [name] (TAVR)  Hospital Problem List     Principal Problem:   Severe aortic stenosis Active Problems:   Essential hypertension   Hyperlipidemia   PVC's (premature ventricular contractions)   Chronic combined systolic and diastolic heart failure (HCC)    Subjective   No complaints. Feeling well.   Inpatient Medications    Scheduled Meds: . amLODipine  10 mg Oral Daily  . aspirin EC  81 mg Oral Daily  . B-complex with vitamin C  1 tablet Oral Daily  . carvedilol  6.25 mg Oral BID WC  . Chlorhexidine Gluconate Cloth  6 each Topical Daily  . clopidogrel  75 mg Oral Q breakfast  . furosemide  40 mg Oral Daily  . hydrALAZINE  25 mg Oral Q8H  . irbesartan  300 mg Oral Daily  . multivitamin with minerals  1 tablet Oral Daily  . omega-3 acid ethyl esters  1 capsule Oral Daily  . [START ON 12/17/2016] pantoprazole  40 mg Oral Daily  . sodium chloride flush  10-40 mL Intracatheter Q12H  . sodium chloride flush  3 mL Intravenous Q12H   Continuous Infusions: . sodium chloride    . albumin human    . cefUROXime (ZINACEF)  IV Stopped (12/16/16 0157)  . lactated ringers    . nitroGLYCERIN    . phenylephrine (NEO-SYNEPHRINE) Adult infusion     PRN Meds: sodium chloride, albumin human, lactated ringers, metoprolol tartrate, midazolam, morphine injection, ondansetron (ZOFRAN) IV, oxyCODONE, sodium chloride flush, sodium chloride flush, traMADol   Vital Signs    Vitals:   12/16/16 0600 12/16/16 0700 12/16/16 0800 12/16/16 0900  BP: 136/71 (!) 143/73 108/72   Pulse: (!) 56 (!) 55 (!) 54   Resp: 11 16 11    Temp:    99.1 F (37.3 C)  TempSrc:    Oral  SpO2: 97% 96% 99%   Weight:      Height:        Intake/Output Summary (Last 24 hours) at 12/16/16 1015 Last data filed at 12/16/16 0900  Gross per 24 hour  Intake           2216.59 ml  Output             1625 ml  Net           591.59 ml   Filed Weights   12/15/16 0603  Weight: 230 lb (104.3 kg)    Physical Exam   GEN: Well nourished, well developed, in no acute distress.  HEENT: Grossly normal.  Neck: Supple, no JVD, carotid bruits, or masses. Cardiac: RRR, barely audible flow murmur, rubs, or gallops. No clubbing, cyanosis, edema.  Radials/DP/PT 2+ and equal bilaterally.  Respiratory:  Respirations regular and unlabored, clear to auscultation bilaterally. GI: Soft, nontender, nondistended, BS + x 4. MS: no deformity or atrophy. Skin: warm and dry, no rash. Neuro:  Strength and sensation are intact. Psych: AAOx3.  Normal affect.  Labs    CBC  Recent Labs  12/15/16 1002 12/15/16 1003 12/16/16 0355  WBC 4.6  --  6.8  HGB 10.6* 9.9* 10.6*  HCT 32.7* 29.0* 33.6*  MCV 87.7  --  88.2  PLT PLATELET CLUMPS NOTED ON SMEAR, COUNT APPEARS DECREASED  --  761   Basic Metabolic Panel  Recent Labs  12/15/16 0932 12/15/16 1003 12/16/16 0355  NA 141 143 138  K 3.9 3.5 4.2  CL 105  --  107  CO2  --   --  25  GLUCOSE 130* 114* 104*  BUN 21*  --  15  CREATININE 1.10  --  1.20  CALCIUM  --   --  8.5*  MG  --   --  2.0   Liver Function Tests No results for input(s): AST, ALT, ALKPHOS, BILITOT, PROT, ALBUMIN in the last 72 hours. No results for input(s): LIPASE, AMYLASE in the last 72 hours. Cardiac Enzymes No results for input(s): CKTOTAL, CKMB, CKMBINDEX, TROPONINI in the last 72 hours. BNP Invalid input(s): POCBNP D-Dimer No results for input(s): DDIMER in the last 72 hours. Hemoglobin A1C No results for input(s): HGBA1C in the last 72 hours. Fasting Lipid Panel No results for input(s): CHOL, HDL, LDLCALC, TRIG, CHOLHDL, LDLDIRECT in the last 72 hours. Thyroid Function Tests No results for input(s): TSH, T4TOTAL, T3FREE, THYROIDAB in the last 72 hours.  Invalid input(s): FREET3  Telemetry    Sinus with freq PVCs - Personally  Reviewed  ECG    Sinus with 1st degree AV block- Personally Reviewed  Radiology    Dg Chest Port 1 View  Result Date: 12/15/2016 CLINICAL DATA:  Status post transcatheter aortic valve replacement. EXAM: PORTABLE CHEST 1 VIEW COMPARISON:  Chest x-ray 12/09/2016 FINDINGS: Aortic valve is noted. The right IJ center venous catheter tip is in the mid distal SVC. The cardiac silhouette, mediastinal and hilar contours are within normal limits given the AP projection. Streaky areas of atelectasis but no edema, effusions or pneumothorax. IMPRESSION: Postoperative changes with a aortic valve in place. No complicating features are demonstrated. Streaky areas of subsegmental atelectasis but no other significant pulmonary findings. Electronically Signed   By: Marijo Sanes M.D.   On: 12/15/2016 11:01   Cardiac Studies   TAVR OPERATIVE NOTE  Date of Procedure:                12/15/2016 Procedure:        Transcatheter Aortic Valve Replacement - Percutaneous Transfemoral Approach             Edwards Sapien 3 THV (size 26 mm, model # 9600TFX, serial # [id number])              Pre-operative Echo Findings: ? Severe aortic stenosis ? Moderately depressed left ventricular systolic function  Post-operative Echo Findings: ? No paravalvular leak ? unchanged left ventricular systolic function   Post operative echocardiogram: pending    Patient Profile     Robert Horn is a 83 y.o. male with a history of chronic combined S/D CHF, HTN, HLD and severe aortic stenosis who presented to [facility] on 12/15/16 for TAVR.  Assessment & Plan    Severe AS: s/p successful TAVR with a 43mm Edwards Sapien 3 THV via TF approach on 12/15/16. ECG shows sinus. Tele with some bradycardia but no high grade block. Post operative echo pending. Groin site stable. Continue ASA/plavix. Will discontinue central line and transfer to the floor. Plan for early ambulation and discharge home tomorrow.   Chronic combined S/D CHF: appears  euvolemic. Resumed on home Coreg, Avapro and lasix.   HTN: BP well controlled today.    Signed, Angelena Form, PA-C  12/16/2016, 10:15 AM  Pager [phone]  I have personally seen and examined this patient with Angelena Form, PA-C. I agree with the assessment and plan as outlined above. He is doing well post TAVR. Vitals stable. Labs  reviewed by me. Echo today. D/C central line and transfer to telemetry today.   Lauree Chandler 12/16/2016 10:39 AM

## 2016-12-16 NOTE — Progress Notes (Signed)
  Echocardiogram 2D Echocardiogram has been performed.  Merrie Roof F 12/16/2016, 11:29 AM

## 2016-12-17 DIAGNOSIS — Z23 Encounter for immunization: Secondary | ICD-10-CM | POA: Diagnosis not present

## 2016-12-17 LAB — TYPE AND SCREEN
ABO/RH(D): O POS
Antibody Screen: NEGATIVE
UNIT DIVISION: 0
Unit division: 0

## 2016-12-17 LAB — BPAM RBC
BLOOD PRODUCT EXPIRATION DATE: 201810162359
Blood Product Expiration Date: 201810162359
ISSUE DATE / TIME: 201809180733
ISSUE DATE / TIME: 201809180733
UNIT TYPE AND RH: 5100
Unit Type and Rh: 5100

## 2016-12-17 MED ORDER — PNEUMOCOCCAL VAC POLYVALENT 25 MCG/0.5ML IJ INJ
0.5000 mL | INJECTION | INTRAMUSCULAR | Status: AC
  Administered 2016-12-17: 0.5 mL via INTRAMUSCULAR
  Filled 2016-12-17: qty 0.5

## 2016-12-17 MED ORDER — CLOPIDOGREL BISULFATE 75 MG PO TABS: 75.0000 mg | ORAL_TABLET | Freq: Every day | ORAL | 2 refills | Status: DC

## 2016-12-17 NOTE — Plan of Care (Signed)
Problem: Activity: Goal: Risk for activity intolerance will decrease Outcome: Progressing Patient to and from bathroom.  No activity intolerance noted per RN.

## 2016-12-17 NOTE — Plan of Care (Signed)
Problem: Education: Goal: Knowledge of [place] [facility] Education information/materials will improve Outcome: Progressing Patient aware of plan of care.  RN provided medication education on medications administered prior to administration.  Patient stated understanding.  Patient denying pain.  RN instructed patient to notify RN immediately if he started to experience any pain.  Patient agreeable to RN instruction.

## 2016-12-17 NOTE — Progress Notes (Signed)
CARDIAC REHAB PHASE I   Pt just ambulated with cardiac mobility specialist, eager for discharge, is requesting a note for work for 30 days, PA aware. Cardiac surgery discharge education completed with pt and wife at bedside. Reviewed restrictions, activity progression, daily weights, s/s chf, exercise, heart healthy diet and phase 2 cardiac rehab. Pt verbalized understanding, declines phase 2 cardiac rehab referral at this time. Pt in bed, call bell within reach.  Cole, RN, BSN 12/17/2016 12:19 PM

## 2016-12-17 NOTE — Discharge Instructions (Signed)

## 2016-12-17 NOTE — Discharge Summary (Signed)
[place] VALVE TEAM     Discharge Summary    Patient ID: Robert Horn,  MRN: [id number], DOB/AGE: July 15, 1933 83 y.o.  Admit date: 12/15/2016 Discharge date: 12/17/2016  Primary Care Provider: [name] Primary Cardiologist: Dr. [name] / Dr. [name] (TAVR)   Discharge Diagnoses    Principal Problem:   Severe aortic stenosis Active Problems:   Essential hypertension   Hyperlipidemia   PVC's (premature ventricular contractions)   Chronic combined systolic and diastolic heart failure (HCC)   Allergies No Known Allergies   History of Present Illness     Micahel Horn is a 83 y.o. male with a history of chronic combined S/D CHF, HTN, HLD and severe aortic stenosis who presented to [facility] on 12/15/16 for TAVR.  Mr Lukes has been followed for aortic stenosis for several years. Over the past few years he has developed progressive LV systolic dysfunction and findings suggestive of progressive and now severe aortic stenosis based on echo criteria.  During the course of the patient's preoperative work up they have been evaluated comprehensively by a multidisciplinary team of specialists coordinated through the [facility] in the [facility]. They have been demonstrated to suffer from symptomatic severe aortic stenosis as noted above and felt to be a suitable candidate for TAVR, which was set up for 12/15/16.   Hospital Course     Consultants: none  Severe AS: s/p successful TAVR with a 48mm Edwards Sapien 3 THV via TF approach on 12/15/16. Tele with some bradycardia but no high grade block. Post operative echo shows well seated valve with no PVL. Groin site stable. Continue ASA/plavix. He is doing well POD #2 and stable for discharge home today. Plan for repeat echo in 1 month.  Chronic combined S/D CHF: appears euvolemic. Resumed on home Coreg, Avapro and lasix.   HTN: BPs have labile  but with moderate control today. Will continue current regimen   The patient has had an uncomplicated hospital course and is recovering well. The femoral catheter sites are stable. He has been seen by Dr. [name] today and deemed ready for discharge home. All follow-up appointments have been scheduled. Discharge medications are listed below.  _____________  Discharge Vitals Blood pressure (!) 145/68, pulse (!) 57, temperature 99 F (37.2 C), temperature source Oral, resp. rate 16, height 6' (1.829 m), weight 224 lb 12.8 oz (102 kg), SpO2 96 %.  Filed Weights   12/15/16 0603 12/17/16 0511  Weight: 230 lb (104.3 kg) 224 lb 12.8 oz (102 kg)   VS:  BP (!) 145/68 (BP Location: Left Arm)   Pulse (!) 57   Temp 99 F (37.2 C) (Oral)   Resp 16   Ht 6' (1.829 m)   Wt 224 lb 12.8 oz (102 kg)   SpO2 96%   BMI 30.49 kg/m    GEN: Well nourished, well developed, in no acute distress  HEENT: normal  Neck: no JVD, carotid bruits, or masses Cardiac: RRR; No rubs, or gallops,no edema. Barely audible flow murmur. Groin site stable  Respiratory:  clear to auscultation bilaterally, normal work of breathing GI: soft, nontender, nondistended, + BS MS: no deformity or atrophy  Skin: warm and dry, no rash Neuro:  Alert and Oriented x 3, Strength and sensation are intact Psych: euthymic mood, full affect    Labs & Radiologic Studies     CBC  Recent Labs  12/15/16 1002 12/15/16 1003 12/16/16 0355  WBC 4.6  --  6.8  HGB 10.6* 9.9* 10.6*  HCT 32.7* 29.0* 33.6*  MCV 87.7  --  88.2  PLT PLATELET CLUMPS NOTED ON SMEAR, COUNT APPEARS DECREASED  --  144   Basic Metabolic Panel  Recent Labs  12/15/16 0932 12/15/16 1003 12/16/16 0355  NA 141 143 138  K 3.9 3.5 4.2  CL 105  --  107  CO2  --   --  25  GLUCOSE 130* 114* 104*  BUN 21*  --  15  CREATININE 1.10  --  1.20  CALCIUM  --   --  8.5*  MG  --   --  2.0   Liver Function Tests No results for input(s): AST, ALT, ALKPHOS, BILITOT,  PROT, ALBUMIN in the last 72 hours. No results for input(s): LIPASE, AMYLASE in the last 72 hours. Cardiac Enzymes No results for input(s): CKTOTAL, CKMB, CKMBINDEX, TROPONINI in the last 72 hours. BNP Invalid input(s): POCBNP D-Dimer No results for input(s): DDIMER in the last 72 hours. Hemoglobin A1C No results for input(s): HGBA1C in the last 72 hours. Fasting Lipid Panel No results for input(s): CHOL, HDL, LDLCALC, TRIG, CHOLHDL, LDLDIRECT in the last 72 hours. Thyroid Function Tests No results for input(s): TSH, T4TOTAL, T3FREE, THYROIDAB in the last 72 hours.  Invalid input(s): FREET3  Dg Chest 2 View  Result Date: 12/10/2016 CLINICAL DATA:  Preoperative examination prior cardiac valve surgery. History of CHF, aortic stenosis, former smoker. EXAM: CHEST  2 VIEW COMPARISON:  Chest x-ray of October 26, 2015 FINDINGS: The lungs are adequately inflated. There is no focal infiltrate. There is linear increased density in the right perihilar region which suggests subsegmental atelectasis or scarring. The heart and pulmonary vascularity are normal. There is calcification in the wall of the thoracic aorta. The bony thorax exhibits no acute abnormality. IMPRESSION: Minimal perihilar subsegmental atelectasis or scarring on the right. No CHF nor other acute cardiopulmonary abnormality. Thoracic aortic atherosclerosis. Electronically Signed   By: David  Martinique M.D.   On: 12/10/2016 07:44   Dg Chest Port 1 View  Result Date: 12/15/2016 CLINICAL DATA:  Status post transcatheter aortic valve replacement. EXAM: PORTABLE CHEST 1 VIEW COMPARISON:  Chest x-ray 12/09/2016 FINDINGS: Aortic valve is noted. The right IJ center venous catheter tip is in the mid distal SVC. The cardiac silhouette, mediastinal and hilar contours are within normal limits given the AP projection. Streaky areas of atelectasis but no edema, effusions or pneumothorax. IMPRESSION: Postoperative changes with a aortic valve in place. No  complicating features are demonstrated. Streaky areas of subsegmental atelectasis but no other significant pulmonary findings. Electronically Signed   By: Marijo Sanes M.D.   On: 12/15/2016 11:01     Diagnostic Studies/Procedures    TAVR OPERATIVE NOTE  Date of Procedure:12/15/2016 Procedure:   Transcatheter Aortic Valve Replacement - Percutaneous Transfemoral Approach Edwards Sapien 3 THV (size 51mm, model # 9600TFX, serial # [id number])  Pre-operative Echo Findings: ? Severe aortic stenosis ? Moderately depressed left ventricular systolic function  Post-operative Echo Findings: ? Noparavalvular leak ? unchangedleft ventricular systolic function  _____________  2D ECHO: 12/16/2016 Study Conclusions - Left ventricle: The cavity size was moderately dilated. Systolic   function was normal. The estimated ejection fraction was in the   range of 50% to 55%. Wall motion was normal; there were no   regional wall motion abnormalities. - Aortic valve: S/P 74mm Sapien bioprosthesis TAVR that is well   seated with normal function. The mean AV gradient is 46mmHg and  peak AV gradient 25mmHg. The AVA is calculated to be 0.8cm2 .   There is no perivalvular AI. Valve area (VTI): 0.8 cm^2. Valve   area (Vmax): 0.72 cm^2. Valve area (Vmean): 0.83 cm^2. - Left atrium: The atrium was moderately dilated. - Right ventricle: The cavity size was moderately dilated. Wall   thickness was normal. - Pulmonary arteries: PA peak pressure: 37 mm Hg (S). Impressions: - Compared to intraop TEE, the mean AVG has increased from 76mmHg   to 71mmHg.  Disposition   Pt is being discharged home today in good condition.  Follow-up Plans & Appointments    Follow-up Information    Eileen Stanford, PA-C. Go on 12/30/2016.   Specialties:  Cardiology, Radiology Why:  @ 2:30pm, please arrive at least 10 minutes early. Contact information: [address] [place] [address] [phone]            Discharge Medications     Medication List    STOP taking these medications   naproxen sodium 220 MG tablet Commonly known as:  ANAPROX     TAKE these medications   amLODipine 10 MG tablet Commonly known as:  NORVASC Take 1 tablet (10 mg total) by mouth daily.   aspirin EC 81 MG EC tablet Generic drug:  aspirin Take 81 mg by mouth daily. Swallow whole.   carvedilol 6.25 MG tablet Commonly known as:  COREG Take 1 tablet (6.25 mg total) by mouth 2 (two) times daily.   clopidogrel 75 MG tablet Commonly known as:  PLAVIX Take 1 tablet (75 mg total) by mouth daily with breakfast.   furosemide 40 MG tablet Commonly known as:  LASIX TAKE 1 TABLET BY MOUTH  DAILY   hydrALAZINE 25 MG tablet Commonly known as:  APRESOLINE TAKE 1 TABLET BY MOUTH EVERY 8 HOURS   multivitamin with minerals Tabs tablet Take 1 tablet by mouth daily.   olmesartan 40 MG tablet Commonly known as:  BENICAR TAKE 1 TABLET BY MOUTH  DAILY   omega-3 acid ethyl esters 1 g capsule Commonly known as:  LOVAZA TAKE ONE CAPSULE BY MOUTH EVERY DAY   VITAMIN B COMPLEX-C PO Take 1 tablet by mouth daily.         Outstanding Labs/Studies   None   Duration of Discharge Encounter   Greater than 30 minutes including physician time.  Signed, Angelena Form PA-C 12/17/2016, 10:09 AM  Patient seen, examined. Available data reviewed. Agree with findings, assessment, and plan as outlined by [name], PA-C. On my exam today: Vitals:   12/16/16 2026 12/17/16 0504  BP: (!) 142/71 (!) 145/68  Pulse: (!) 58 (!) 57  Resp: 16 16  Temp: 98.4 F (36.9 C) 99 F (37.2 C)  SpO2: 99% 96%   Pt is alert and oriented, NAD HEENT: normal Neck: JVP - normal Lungs: CTA bilaterally CV: RRR with 2/6 sem at the RUSB, no diastolic murmur Abd: soft, NT, Positive BS, no hepatomegaly Ext: no C/C/E, distal pulses intact and equal, bilateral groin sites  are clear Skin: warm/dry no rash  The patient is doing very well and he is stable for hospital discharge. Have reviewed medication changes suggested above. Follow-up is arranged. Post TAVR restrictions reviewed with the patient. Echo findings are reviewed. All questions are answered.  Sherren Mocha, M.D. 12/17/2016 1:18 PM

## 2016-12-18 ENCOUNTER — Encounter: Payer: Self-pay | Admitting: Interventional Cardiology

## 2016-12-18 ENCOUNTER — Other Ambulatory Visit: Payer: Self-pay | Admitting: Interventional Cardiology

## 2016-12-18 MED ORDER — AMLODIPINE BESYLATE 10 MG PO TABS: 10.0000 mg | ORAL_TABLET | Freq: Every day | ORAL | 7 refills | Status: DC

## 2016-12-18 MED FILL — Potassium Chloride Inj 2 mEq/ML: INTRAVENOUS | Qty: 40 | Status: AC

## 2016-12-18 NOTE — Telephone Encounter (Signed)
Pt's medication was sent to pt's pharmacy as requested. Confirmation received.  °

## 2016-12-18 NOTE — Consult Note (Signed)
           [facility] CM Primary Care Navigator  12/18/2016  Rashaud Ybarbo 1934-01-05 [id number]   Attempt to seepatient at the bedsideto identify possible discharge needs but he was already discharged per staff report.  Patient was discharged home yesterday.  Primary care provider's officeis listed as doing transition of care (TOC).   For questions, please contact:  Dannielle Huh, BSN, RN- [facility] Primary Care Navigator  Telephone: [phone] [place]

## 2016-12-23 MED FILL — Phenylephrine HCl Inj 10 MG/ML: INTRAMUSCULAR | Qty: 2 | Status: AC

## 2016-12-23 MED FILL — Sodium Chloride IV Soln 0.9%: INTRAVENOUS | Qty: 250 | Status: AC

## 2016-12-28 ENCOUNTER — Other Ambulatory Visit: Payer: Self-pay | Admitting: Interventional Cardiology

## 2016-12-28 NOTE — Progress Notes (Signed)
[facility]   MULTIDISCIPLINARY HEART VALVE TEAM   Cardiology Office Note    Date:  12/30/2016   ID:  Robert Horn, DOB Dec 01, 1933, MRN [id number]  PCP:  [name], MD  Cardiologist:  Dr. Tamala Horn / Dr. Burt Horn (TAVR)  CC: TOC follow up after TAVR   History of Present Illness:  Robert Horn is a 83 y.o. male with a history of chronic combined S/D CHF, HTN, HLD and severe aortic stenosis s/p TAVR (12/15/16) who presents to clinic for post hospital follow up.   Mr Hass has been followed for aortic stenosis for several years. Over the past few years he has developed progressive LV systolic dysfunction and findings suggestive of progressive and then severe aortic stenosis based on echo criteria.  He underwent successful TAVR with a 78mm Edwards Sapien 3 THV via TF approach on 12/15/16. Post operative echo showed a well seated valve with no PVL. He was discharged on ASA and plavix.   Today he presents to clinic for follow up. No CP or SOB at rest although he does still have some SOB with walking up an incline. None during normal daily activities.  Doesn't notice that much of a difference in his breathing. No LE edema, orthopnea or PND. No dizziness or syncope. No blood in stool or urine. No palpitations.    Past Medical History:  Diagnosis Date  . CHF (congestive heart failure) ([place])   . Dyslipidemia 10/27/2015  . Dyspnea    w/ exertion   . Elevated PSA 10/27/2015  . Erectile dysfunction 10/27/2015  . Heart murmur   . Hypertension 10/27/2015  . Hypogonadism male 10/27/2015  . Obesity 10/27/2015  . Pneumonia 10/27/2015   pt states was 1982  . Rotator cuff tear 10/27/2015    Past Surgical History:  Procedure Laterality Date  . CARDIAC CATHETERIZATION N/A 11/14/2015   Procedure: Left Heart Cath and Coronary Angiography;  Surgeon: Robert Crome, MD;  Location: [facility];  Service: Cardiovascular;  Laterality: N/A;  . TEE WITHOUT CARDIOVERSION N/A 12/15/2016    Procedure: TRANSESOPHAGEAL ECHOCARDIOGRAM (TEE);  Surgeon: Robert Mocha, MD;  Location: [facility];  Service: Open Heart Surgery;  Laterality: N/A;  . TRANSCATHETER AORTIC VALVE REPLACEMENT, TRANSFEMORAL N/A 12/15/2016   Procedure: TRANSCATHETER AORTIC VALVE REPLACEMENT, TRANSFEMORAL;  Surgeon: Robert Mocha, MD;  Location: [facility];  Service: Open Heart Surgery;  Laterality: N/A;    Current Medications: Outpatient Medications Prior to Visit  Medication Sig Dispense Refill  . amLODipine (NORVASC) 10 MG tablet Take 1 tablet (10 mg total) by mouth daily. 30 tablet 7  . aspirin (ASPIRIN EC) 81 MG EC tablet Take 81 mg by mouth daily. Swallow whole.    . carvedilol (COREG) 6.25 MG tablet Take 1 tablet (6.25 mg total) by mouth 2 (two) times daily. 180 tablet 3  . clopidogrel (PLAVIX) 75 MG tablet Take 1 tablet (75 mg total) by mouth daily with breakfast. 90 tablet 2  . furosemide (LASIX) 40 MG tablet TAKE 1 TABLET BY MOUTH  DAILY 90 tablet 3  . hydrALAZINE (APRESOLINE) 25 MG tablet TAKE 1 TABLET BY MOUTH EVERY 8 HOURS 90 tablet 11  . Multiple Vitamin (MULTIVITAMIN WITH MINERALS) TABS tablet Take 1 tablet by mouth daily. 30 tablet 1  . olmesartan (BENICAR) 40 MG tablet TAKE 1 TABLET BY MOUTH  DAILY 90 tablet 3  . omega-3 acid ethyl esters (LOVAZA) 1 g capsule TAKE ONE CAPSULE BY MOUTH EVERY DAY 30 capsule 11  . VITAMIN  B COMPLEX-C PO Take 1 tablet by mouth daily.     No facility-administered medications prior to visit.      Allergies:   Patient has no known allergies.   Social History   Social History  . Marital status: Single    Spouse name: N/A  . Number of children: N/A  . Years of education: N/A   Social History Main Topics  . Smoking status: Former Smoker    Types: Cigarettes  . Smokeless tobacco: Never Used  . Alcohol use Yes     Comment: rarely  . Drug use: No  . Sexual activity: Not Asked   Other Topics Concern  . None   Social History Narrative  . None     Family  History:  The patient's family history includes Diabetes in his mother; Heart disease in his mother; Pulmonary embolism in his father.      ROS:   Please see the history of present illness.    ROS All other systems reviewed and are negative.   PHYSICAL EXAM:   VS:  BP 136/64   Pulse (!) 59   Ht 6' (1.829 m)   Wt 229 lb 12.8 oz (104.2 kg)   SpO2 98%   BMI 31.17 kg/m    GEN: Well nourished, well developed, in no acute distress  HEENT: normal  Neck: no JVD, carotid bruits, or masses Cardiac: RRR,  Very soft flow murmur. NO rubs, or gallops,no edema  Respiratory:  clear to auscultation bilaterally, normal work of breathing GI: soft, nontender, nondistended, + BS MS: no deformity or atrophy  Skin: warm and dry, no rash Neuro:  Alert and Oriented x 3, Strength and sensation are intact Psych: euthymic mood, full affect    Wt Readings from Last 3 Encounters:  12/30/16 229 lb 12.8 oz (104.2 kg)  12/17/16 224 lb 12.8 oz (102 kg)  12/09/16 234 lb (106.1 kg)      Studies/Labs Reviewed:   EKG:  EKG is ordered today.  The ekg ordered today demonstrates sinus brady with 1st degree AV bock and PVCs, HR 58, IRBBB - stable from previous tracings  Recent Labs: 06/29/2016: NT-Pro BNP 448 12/09/2016: ALT 15 12/16/2016: BUN 15; Creatinine, Ser 1.20; Hemoglobin 10.6; Magnesium 2.0; Platelets 155; Potassium 4.2; Sodium 138   Lipid Panel No results found for: CHOL, TRIG, HDL, CHOLHDL, VLDL, LDLCALC, LDLDIRECT  Additional studies/ records that were reviewed today include:  TAVR OPERATIVE NOTE  Date of Procedure:12/15/2016 Procedure:   Transcatheter Aortic Valve Replacement - Percutaneous Transfemoral Approach Edwards Sapien 3 THV (size 34mm, model # 9600TFX, serial # [id number])  Pre-operative Echo Findings: ? Severe aortic stenosis ? Moderately depressed left ventricular systolic function  Post-operative Echo Findings: ? Noparavalvular  leak ? unchangedleft ventricular systolic function  _____________  2D ECHO: 12/16/2016 Study Conclusions - Left ventricle: The cavity size was moderately dilated. Systolic function was normal. The estimated ejection fraction was in the range of 50% to 55%. Wall motion was normal; there were no regional wall motion abnormalities. - Aortic valve: S/P 14mm Sapien bioprosthesis TAVR that is well seated with normal function. The mean AV gradient is 68mmHg and peak AV gradient 38mmHg. The AVA is calculated to be 0.8cm2 . There is no perivalvular AI. Valve area (VTI): 0.8 cm^2. Valve area (Vmax): 0.72 cm^2. Valve area (Vmean): 0.83 cm^2. - Left atrium: The atrium was moderately dilated. - Right ventricle: The cavity size was moderately dilated. Wall thickness was normal. - Pulmonary arteries: PA peak pressure:  37 mm Hg (S). Impressions: - Compared to intraop TEE, the mean AVG has increased from 41mmHg to 53mmHg.   ASSESSMENT & PLAN:   Severe AS s/p TAVR: doing well s/p TAVR. ECG with no high grade block. Groin sites stable. Continue ASA and plavix.   Chronic combined S/D CHF: appears euvolemic. Continue BB, ARB and lasix  HTN: BP well controlled today.    Medication Adjustments/Labs and Tests Ordered: Current medicines are reviewed at length with the patient today.  Concerns regarding medicines are outlined above.  Medication changes, Labs and Tests ordered today are listed in the Patient Instructions below. Patient Instructions  Medication Instructions:  Your physician recommends that you continue on your current medications as directed. Please refer to the Current Medication list given to you today.   Labwork: none  Testing/Procedures: Your physician has requested that you have an echocardiogram. Echocardiography is a painless test that uses sound waves to create images of your heart. It provides your doctor with information about the size and shape of  your heart and how well your heart's chambers and valves are working. This procedure takes approximately one hour. There are no restrictions for this procedure.  Scheduled for October 11,2018  Follow-Up: Follow up on October 11,2018 as planned with K. Grandville Silos, [place]  Any Other Special Instructions Will Be Listed Below (If Applicable).     If you need a refill on your cardiac medications before your next appointment, please call your pharmacy.      Signed, Angelena Form, PA-C  12/30/2016 3:24 PM    [facility] [place], [place], [place]  [address] Phone: [phone]; Fax: [phone]

## 2016-12-30 ENCOUNTER — Encounter: Payer: Self-pay | Admitting: Physician Assistant

## 2016-12-30 ENCOUNTER — Ambulatory Visit (INDEPENDENT_AMBULATORY_CARE_PROVIDER_SITE_OTHER): Payer: Medicare Other | Admitting: Physician Assistant

## 2016-12-30 VITALS — BP 136/64 | HR 59 | Ht 72.0 in | Wt 229.8 lb

## 2016-12-30 DIAGNOSIS — Z952 Presence of prosthetic heart valve: Secondary | ICD-10-CM | POA: Diagnosis not present

## 2016-12-30 DIAGNOSIS — I5042 Chronic combined systolic (congestive) and diastolic (congestive) heart failure: Secondary | ICD-10-CM | POA: Diagnosis not present

## 2016-12-30 DIAGNOSIS — I10 Essential (primary) hypertension: Secondary | ICD-10-CM

## 2016-12-30 NOTE — Patient Instructions (Signed)
Medication Instructions:  Your physician recommends that you continue on your current medications as directed. Please refer to the Current Medication list given to you today.   Labwork: none  Testing/Procedures: Your physician has requested that you have an echocardiogram. Echocardiography is a painless test that uses sound waves to create images of your heart. It provides your doctor with information about the size and shape of your heart and how well your heart's chambers and valves are working. This procedure takes approximately one hour. There are no restrictions for this procedure.  Scheduled for October 11,2018  Follow-Up: Follow up on October 11,2018 as planned with K. Grandville Silos, [place]  Any Other Special Instructions Will Be Listed Below (If Applicable).     If you need a refill on your cardiac medications before your next appointment, please call your pharmacy.

## 2016-12-30 NOTE — Progress Notes (Signed)
Cardiology Office Note    Date:  01/08/2017   ID:  Robert Horn, DOB Dec 25, 1933, MRN [id number]  PCP:  Robert Carol, MD  Cardiologist: Dr. [name] / Dr. [name] (TAVR)  CC: 1 month follow up s/p TAVR  History of Present Illness:  Robert Horn is a 83 y.o. male with a history of chronic combined S/D CHF, HTN, HLD and severe aortic stenosis s/p TAVR (12/15/16) who presents to clinic for 1 month follow up.   Robert Horn has been followed for aortic stenosis for several years. Over the past few years he has developed progressive LV systolic dysfunction and findings suggestive of progressive and then severe aortic stenosis based on echo criteria.  He underwent successful TAVR with a 64mm Edwards Sapien 3 THV via TF approach on 12/15/16. Post operative echo showed a well seated valve with no PVL. He was discharged on ASA and plavix.   Today he presents to clinic for follow up. He continues to have dyspnea. He thinks may be worse than even a week ago. He notices it mostly with exertion with moderate activities.  No CP. No LE edema, orthopnea or PND. No dizziness or syncope. No blood in stool or urine. No palpitations.    Past Medical History:  Diagnosis Date  . CHF (congestive heart failure) ([place])   . Dyslipidemia 10/27/2015  . Dyspnea    w/ exertion   . Elevated PSA 10/27/2015  . Erectile dysfunction 10/27/2015  . Heart murmur   . Hypertension 10/27/2015  . Hypogonadism male 10/27/2015  . Obesity 10/27/2015  . Pneumonia 10/27/2015   pt states was 1982  . Rotator cuff tear 10/27/2015    Past Surgical History:  Procedure Laterality Date  . CARDIAC CATHETERIZATION N/A 11/14/2015   Procedure: Left Heart Cath and Coronary Angiography;  Surgeon: [name], MD;  Location: [facility];  Service: Cardiovascular;  Laterality: N/A;  . TEE WITHOUT CARDIOVERSION N/A 12/15/2016   Procedure: TRANSESOPHAGEAL ECHOCARDIOGRAM (TEE);  Surgeon: Sherren Mocha, MD;  Location: [facility];   Service: Open Heart Surgery;  Laterality: N/A;  . TRANSCATHETER AORTIC VALVE REPLACEMENT, TRANSFEMORAL N/A 12/15/2016   Procedure: TRANSCATHETER AORTIC VALVE REPLACEMENT, TRANSFEMORAL;  Surgeon: Sherren Mocha, MD;  Location: [facility];  Service: Open Heart Surgery;  Laterality: N/A;    Current Medications: Outpatient Medications Prior to Visit  Medication Sig Dispense Refill  . amLODipine (NORVASC) 10 MG tablet Take 1 tablet (10 mg total) by mouth daily. 30 tablet 7  . aspirin (ASPIRIN EC) 81 MG EC tablet Take 81 mg by mouth daily. Swallow whole.    . carvedilol (COREG) 6.25 MG tablet Take 1 tablet (6.25 mg total) by mouth 2 (two) times daily. 180 tablet 3  . clopidogrel (PLAVIX) 75 MG tablet Take 1 tablet (75 mg total) by mouth daily with breakfast. 90 tablet 2  . furosemide (LASIX) 40 MG tablet TAKE 1 TABLET BY MOUTH  DAILY 90 tablet 3  . hydrALAZINE (APRESOLINE) 25 MG tablet TAKE 1 TABLET BY MOUTH EVERY 8 HOURS 90 tablet 11  . Multiple Vitamin (MULTIVITAMIN WITH MINERALS) TABS tablet Take 1 tablet by mouth daily. 30 tablet 1  . olmesartan (BENICAR) 40 MG tablet TAKE 1 TABLET BY MOUTH  DAILY 90 tablet 3  . omega-3 acid ethyl esters (LOVAZA) 1 g capsule TAKE ONE CAPSULE BY MOUTH EVERY DAY 30 capsule 11  . VITAMIN B COMPLEX-C PO Take 1 tablet by mouth daily.     No facility-administered medications prior to visit.  Allergies:   Patient has no known allergies.   Social History   Social History  . Marital status: Single    Spouse name: N/A  . Number of children: N/A  . Years of education: N/A   Social History Main Topics  . Smoking status: Former Smoker    Types: Cigarettes  . Smokeless tobacco: Never Used  . Alcohol use Yes     Comment: rarely  . Drug use: No  . Sexual activity: Not Asked   Other Topics Concern  . None   Social History Narrative  . None     Family History:  The patient's family history includes Diabetes in his mother; Heart disease in his mother;  Pulmonary embolism in his father.      ROS:   Please see the history of present illness.    ROS All other systems reviewed and are negative.   PHYSICAL EXAM:   VS:  BP 128/66   Pulse 87   Ht 6' (1.829 m)   Wt 227 lb (103 kg) Comment: pt stated weight  SpO2 94%   BMI 30.79 kg/m    GEN: Well nourished, well developed, in no acute distress  HEENT: normal  Neck: no JVD, carotid bruits, or masses Cardiac: RRR; no murmurs, rubs, or gallops,no edema  Respiratory:  clear to auscultation bilaterally, normal work of breathing GI: soft, nontender, nondistended, + BS MS: no deformity or atrophy  Skin: warm and dry, no rash Neuro:  Alert and Oriented x 3, Strength and sensation are intact Psych: euthymic mood, full affect  Wt Readings from Last 3 Encounters:  01/07/17 227 lb (103 kg)  12/30/16 229 lb 12.8 oz (104.2 kg)  12/17/16 224 lb 12.8 oz (102 kg)    Studies/Labs Reviewed:   EKG:  EKG is ordered today. This shows sinus brady with 1st degree AV block with PACs, IRBBB HR 58    Recent Labs: 12/09/2016: ALT 15 12/16/2016: Magnesium 2.0 01/07/2017: BUN 25; Creatinine, Ser 1.46; Hemoglobin 11.8; NT-Pro BNP 474; Platelets 257; Potassium 4.4; Sodium 142   Lipid Panel No results found for: CHOL, TRIG, HDL, CHOLHDL, VLDL, LDLCALC, LDLDIRECT  Additional studies/ records that were reviewed today include:  TAVR OPERATIVE NOTE  Date of Procedure:12/15/2016 Procedure:   Transcatheter Aortic Valve Replacement - Percutaneous Transfemoral Approach Edwards Sapien 3 THV (size 19mm, model # 9600TFX, serial # [id number])  Pre-operative Echo Findings: ? Severe aortic stenosis ? Moderately depressed left ventricular systolic function  Post-operative Echo Findings: ? Noparavalvular leak ? unchangedleft ventricular systolic function  _____________  2D ECHO: 12/16/2016 Study Conclusions - Left ventricle: The cavity size was moderately dilated.  Systolic function was normal. The estimated ejection fraction was in the range of 50% to 55%. Wall motion was normal; there were no regional wall motion abnormalities. - Aortic valve: S/P 55mm Sapien bioprosthesis TAVR that is well seated with normal function. The mean AV gradient is 45mmHg and peak AV gradient 10mmHg. The AVA is calculated to be 0.8cm2 . There is no perivalvular AI. Valve area (VTI): 0.8 cm^2. Valve area (Vmax): 0.72 cm^2. Valve area (Vmean): 0.83 cm^2. - Left atrium: The atrium was moderately dilated. - Right ventricle: The cavity size was moderately dilated. Wall thickness was normal. - Pulmonary arteries: PA peak pressure: 37 mm Hg (S). Impressions: - Compared to intraop TEE, the mean AVG has increased from 84mmHg to 79mmHg.  ________________   2D ECHO: 01/07/17  Study Conclusions  - Left ventricle: The cavity size was mildly dilated.  Systolic   function was mildly reduced. The estimated ejection fraction was   in the range of 45% to 50%. Wall motion was normal; there were no   regional wall motion abnormalities. Features are consistent with   a pseudonormal left ventricular filling pattern, with concomitant   abnormal relaxation and increased filling pressure (grade 2   diastolic dysfunction). - Aortic valve: A stent-valve (TAVR) bioprosthesis was present and   functioning normally. Valve area (VTI): 1.41 cm^2. - Left atrium: The atrium was moderately to severely dilated.  Impressions:  - Slight increase in TAVR gradients noted.  ASSESSMENT & PLAN:   Severe AS s/p TAVR: he has NHYA class II symptoms. 2D ECHO today shows a well functioning valve with a mean gradient of 12 mm Hg and improved LV function. Continue ASA and plavix. Okay to discontinue plavix after 6 months of therapy. We discussed SBE prophylaxis. I will see him back in 1 year with a repeat echo.   Chronic combined S/D CHF: EF previously 25-30% in 09/2016. Now with  improvement in LV function to 45-50% s/p TAVR. Continue Coreg, Benicar and lasix.   HTN: BP well controlled today. Continue current regimen  Dyspnea: he does not appear volume overloaded. Will check a CXR, BNP, BMET and CBC to rule out occult CHF or anemia as a cause of his worsening dyspnea. We have encouraged cardiac rehab and increased physical activity.    Medication Adjustments/Labs and Tests Ordered: Current medicines are reviewed at length with the patient today.  Concerns regarding medicines are outlined above.  Medication changes, Labs and Tests ordered today are listed in the Patient Instructions below. Patient Instructions  Medication Instructions:  Your physician recommends that you continue on your current medications as directed. Please refer to the Current Medication list given to you today.  AN RX FOR AMOXICILLIN 500 MG TABLET WITH THE DIRECTIONS TO READ TAKE 4 TABS 1 HOUR BEFORE DENTAL WORK.   Labwork: TODAY BMET, CBC, PRO BNP  Testing/Procedures: A chest x-ray takes a picture of the organs and structures inside the chest, including the heart, lungs, and blood vessels. This test can show several things, including, whether the heart is enlarges; whether fluid is building up in the lungs; and whether pacemaker / defibrillator leads are still in place. THIS IS TO BE DONE TODAY; AT [facility]    Follow-Up: DR. [name] 1 MONTH  KATIE Elbia Paro, PAC IN 1 YEAR   Any Other Special Instructions Will Be Listed Below (If Applicable).     If you need a refill on your cardiac medications before your next appointment, please call your pharmacy.      Signed, Angelena Form, PA-C  01/08/2017 10:34 AM    [facility] [place], [place], [place]  [address] Phone: [phone]; Fax: [phone]

## 2017-01-07 ENCOUNTER — Ambulatory Visit (INDEPENDENT_AMBULATORY_CARE_PROVIDER_SITE_OTHER): Payer: Medicare Other | Admitting: Physician Assistant

## 2017-01-07 ENCOUNTER — Encounter: Payer: Self-pay | Admitting: Physician Assistant

## 2017-01-07 ENCOUNTER — Ambulatory Visit
Admission: RE | Admit: 2017-01-07 | Discharge: 2017-01-07 | Disposition: A | Discharge status: Home / Self Care | Payer: Medicare Other | Source: Ambulatory Visit | Attending: Physician Assistant | Admitting: Physician Assistant

## 2017-01-07 ENCOUNTER — Other Ambulatory Visit: Payer: Self-pay

## 2017-01-07 ENCOUNTER — Ambulatory Visit (HOSPITAL_COMMUNITY): Payer: Medicare Other | Attending: Cardiovascular Disease

## 2017-01-07 VITALS — BP 128/66 | HR 87 | Ht 72.0 in | Wt 227.0 lb

## 2017-01-07 DIAGNOSIS — Z8249 Family history of ischemic heart disease and other diseases of the circulatory system: Secondary | ICD-10-CM | POA: Insufficient documentation

## 2017-01-07 DIAGNOSIS — Z87891 Personal history of nicotine dependence: Secondary | ICD-10-CM | POA: Diagnosis not present

## 2017-01-07 DIAGNOSIS — I509 Heart failure, unspecified: Secondary | ICD-10-CM | POA: Insufficient documentation

## 2017-01-07 DIAGNOSIS — Z953 Presence of xenogenic heart valve: Secondary | ICD-10-CM | POA: Insufficient documentation

## 2017-01-07 DIAGNOSIS — Z952 Presence of prosthetic heart valve: Secondary | ICD-10-CM

## 2017-01-07 DIAGNOSIS — I5042 Chronic combined systolic (congestive) and diastolic (congestive) heart failure: Secondary | ICD-10-CM

## 2017-01-07 DIAGNOSIS — E669 Obesity, unspecified: Secondary | ICD-10-CM | POA: Diagnosis not present

## 2017-01-07 DIAGNOSIS — E785 Hyperlipidemia, unspecified: Secondary | ICD-10-CM | POA: Insufficient documentation

## 2017-01-07 DIAGNOSIS — I10 Essential (primary) hypertension: Secondary | ICD-10-CM

## 2017-01-07 DIAGNOSIS — I350 Nonrheumatic aortic (valve) stenosis: Secondary | ICD-10-CM | POA: Diagnosis not present

## 2017-01-07 DIAGNOSIS — Z6831 Body mass index (BMI) 31.0-31.9, adult: Secondary | ICD-10-CM | POA: Insufficient documentation

## 2017-01-07 DIAGNOSIS — I110 Hypertensive heart disease with heart failure: Secondary | ICD-10-CM | POA: Insufficient documentation

## 2017-01-07 MED ORDER — AMOXICILLIN 500 MG PO TABS: 500.0000 mg | ORAL_TABLET | ORAL | 0 refills | Status: DC

## 2017-01-07 NOTE — Patient Instructions (Signed)
Medication Instructions:  Your physician recommends that you continue on your current medications as directed. Please refer to the Current Medication list given to you today.  AN RX FOR AMOXICILLIN 500 MG TABLET WITH THE DIRECTIONS TO READ TAKE 4 TABS 1 HOUR BEFORE DENTAL WORK.   Labwork: TODAY BMET, CBC, PRO BNP  Testing/Procedures: A chest x-ray takes a picture of the organs and structures inside the chest, including the heart, lungs, and blood vessels. This test can show several things, including, whether the heart is enlarges; whether fluid is building up in the lungs; and whether pacemaker / defibrillator leads are still in place. THIS IS TO BE DONE TODAY; AT [facility]    Follow-Up: DR. [name] 1 MONTH  KATIE THOMPSON, PAC IN 1 YEAR   Any Other Special Instructions Will Be Listed Below (If Applicable).     If you need a refill on your cardiac medications before your next appointment, please call your pharmacy.

## 2017-01-08 ENCOUNTER — Telehealth: Payer: Self-pay | Admitting: *Deleted

## 2017-01-08 LAB — BASIC METABOLIC PANEL
BUN/Creatinine Ratio: 17 (ref 10–24)
BUN: 25 mg/dL (ref 8–27)
CALCIUM: 9.6 mg/dL (ref 8.6–10.2)
CO2: 24 mmol/L (ref 20–29)
CREATININE: 1.46 mg/dL — AB (ref 0.76–1.27)
Chloride: 102 mmol/L (ref 96–106)
GFR calc Af Amer: 51 mL/min/{1.73_m2} — ABNORMAL LOW (ref >59)
GFR calc non Af Amer: 44 mL/min/{1.73_m2} — ABNORMAL LOW (ref >59)
GLUCOSE: 114 mg/dL — AB (ref 65–99)
Potassium: 4.4 mmol/L (ref 3.5–5.2)
Sodium: 142 mmol/L (ref 134–144)

## 2017-01-08 LAB — CBC
HEMOGLOBIN: 11.8 g/dL — AB (ref 13.0–17.7)
Hematocrit: 36.5 % — ABNORMAL LOW (ref 37.5–51.0)
MCH: 28.5 pg (ref 26.6–33.0)
MCHC: 32.3 g/dL (ref 31.5–35.7)
MCV: 88 fL (ref 79–97)
Platelets: 257 10*3/uL (ref 150–379)
RBC: 4.14 x10E6/uL (ref 4.14–5.80)
RDW: 14.4 % (ref 12.3–15.4)
WBC: 5.5 10*3/uL (ref 3.4–10.8)

## 2017-01-08 LAB — PRO B NATRIURETIC PEPTIDE: NT-PRO BNP: 474 pg/mL (ref 0–486)

## 2017-01-08 NOTE — Telephone Encounter (Signed)
Tried to reach pt x 2 to go over results. DPR ok to lmom on home #. Lmom CXR, labs and echo (pumping action improved) per [name], PA look good. If any questions call [phone]

## 2017-01-08 NOTE — Telephone Encounter (Signed)
Left message to go over CXR and lab results.

## 2017-01-08 NOTE — Telephone Encounter (Signed)
-----   Message from Eileen Stanford, PA-C sent at 01/08/2017  9:19 AM EDT ----- Labs and CXR look good. No explanation for SOB. ECHO showed significant improvement in his pumping function. Continue current plan for cardiac rehab and follow up with Dr. [name] in December.

## 2017-01-12 ENCOUNTER — Encounter: Payer: Self-pay | Admitting: Thoracic Surgery (Cardiothoracic Vascular Surgery)

## 2017-02-10 ENCOUNTER — Telehealth: Payer: Self-pay | Admitting: Interventional Cardiology

## 2017-02-10 ENCOUNTER — Telehealth: Payer: Self-pay

## 2017-02-10 NOTE — Telephone Encounter (Signed)
   Chart reviewed as part of pre-operative protocol coverage. Given past medical history and time since last visit, based on ACC/AHA guidelines, Robert Horn would be at acceptable risk for the planned procedure without further cardiovascular testing. Mr. Mckesson should remain on ASA/Plavix throughout the peri-procedural period.  I will route this recommendation to the requesting party via Epic fax function and remove from pre-op pool.  Please call with questions.  Murray Hodgkins, NP 02/10/2017, 4:14 PM

## 2017-02-10 NOTE — Telephone Encounter (Signed)
Walk In pt [place] paper Dropped off. Placed in Clearance Box.

## 2017-02-10 NOTE — Telephone Encounter (Signed)
   [facility] Pre-operative Risk Assessment    Request for surgical clearance:  1. What type of surgery is being performed? 3 simple extractions   2. When is this surgery scheduled? TBD   3. Are there any medications that need to be held prior to surgery and how long?not stated   4. Practice name and name of physician performing surgery? [facility]/ Dr. Perlie Gold   5. What is your office phone and fax number? P [phone]  F [phone]    6. Anesthesia type (None, local, MAC, general) ? Not stated   Tod Persia 02/10/2017, 2:52 PM  _________________________________________________________________   (provider comments below)

## 2017-03-03 DIAGNOSIS — Z952 Presence of prosthetic heart valve: Secondary | ICD-10-CM | POA: Insufficient documentation

## 2017-03-03 NOTE — Progress Notes (Signed)
Cardiology Office Note    Date:  03/04/2017   ID:  Robert Horn, DOB [birth year], MRN [id number]  PCP:  [name], MD  Cardiologist: Sinclair Grooms, MD   Chief Complaint  Patient presents with  . Cardiac Valve Problem    History of Present Illness:  Robert Horn is a 82 y.o. male with a history of chronic combined S/D CHF, HTN, HLD and severe aortic stenosis s/p TAVR (12/15/16) who presents for 3 month follow-up..    Complaining of exertional fatigue and dyspnea.  Had successful TAVR in September 2018.  EF increased from 30% to 45%.  He denies orthopnea, PND, edema, and syncope.  He denies angina.  No peripheral edema.   Past Medical History:  Diagnosis Date  . CHF (congestive heart failure) ([place])   . Dyslipidemia 10/27/2015  . Dyspnea    w/ exertion   . Elevated PSA 10/27/2015  . Erectile dysfunction 10/27/2015  . Heart murmur   . Hypertension 10/27/2015  . Hypogonadism male 10/27/2015  . Obesity 10/27/2015  . Pneumonia 10/27/2015   pt states was 1982  . Rotator cuff tear 10/27/2015    Past Surgical History:  Procedure Laterality Date  . CARDIAC CATHETERIZATION N/A 11/14/2015   Procedure: Left Heart Cath and Coronary Angiography;  Surgeon: [name], MD;  Location: [facility];  Service: Cardiovascular;  Laterality: N/A;  . TEE WITHOUT CARDIOVERSION N/A 12/15/2016   Procedure: TRANSESOPHAGEAL ECHOCARDIOGRAM (TEE);  Surgeon: Sherren Mocha, MD;  Location: [facility];  Service: Open Heart Surgery;  Laterality: N/A;  . TRANSCATHETER AORTIC VALVE REPLACEMENT, TRANSFEMORAL N/A 12/15/2016   Procedure: TRANSCATHETER AORTIC VALVE REPLACEMENT, TRANSFEMORAL;  Surgeon: Sherren Mocha, MD;  Location: [facility];  Service: Open Heart Surgery;  Laterality: N/A;    Current Medications: Outpatient Medications Prior to Visit  Medication Sig Dispense Refill  . amLODipine (NORVASC) 10 MG tablet Take 1 tablet (10 mg total) by mouth daily. 30 tablet 7  . amoxicillin (AMOXIL)  500 MG tablet Take 1 tablet (500 mg total) by mouth as directed. 4 TABS 1 HOUR BEFORE DENTAL WORK 4 tablet 0  . aspirin (ASPIRIN EC) 81 MG EC tablet Take 81 mg by mouth daily. Swallow whole.    . carvedilol (COREG) 6.25 MG tablet Take 1 tablet (6.25 mg total) by mouth 2 (two) times daily. 180 tablet 3  . clopidogrel (PLAVIX) 75 MG tablet Take 1 tablet (75 mg total) by mouth daily with breakfast. 90 tablet 2  . furosemide (LASIX) 40 MG tablet TAKE 1 TABLET BY MOUTH  DAILY 90 tablet 3  . hydrALAZINE (APRESOLINE) 25 MG tablet TAKE 1 TABLET BY MOUTH EVERY 8 HOURS 90 tablet 11  . Multiple Vitamin (MULTIVITAMIN WITH MINERALS) TABS tablet Take 1 tablet by mouth daily. 30 tablet 1  . olmesartan (BENICAR) 40 MG tablet TAKE 1 TABLET BY MOUTH  DAILY 90 tablet 3  . omega-3 acid ethyl esters (LOVAZA) 1 g capsule TAKE ONE CAPSULE BY MOUTH EVERY DAY 30 capsule 11  . VITAMIN B COMPLEX-C PO Take 1 tablet by mouth daily.     No facility-administered medications prior to visit.      Allergies:   Patient has no known allergies.   Social History   Socioeconomic History  . Marital status: Single    Spouse name: None  . Number of children: None  . Years of education: None  . Highest education level: None  Social Needs  . Financial resource strain: None  .  Food insecurity - worry: None  . Food insecurity - inability: None  . Transportation needs - medical: None  . Transportation needs - non-medical: None  Occupational History  . None  Tobacco Use  . Smoking status: Former Smoker    Types: Cigarettes  . Smokeless tobacco: Never Used  Substance and Sexual Activity  . Alcohol use: Yes    Comment: rarely  . Drug use: No  . Sexual activity: None  Other Topics Concern  . None  Social History Narrative  . None     Family History:  The patient's family history includes Diabetes in his mother; Heart disease in his mother; Pulmonary embolism in his father.   ROS:   Please see the history of present  illness.    He has dental work that needs to be done.  His dentist is Dr. Perlie Gold DDS. [phone] [place], [place] [address]. All other systems reviewed and are negative.   PHYSICAL EXAM:   VS:  BP 138/70   Pulse 69   Ht 6' (1.829 m)   Wt 231 lb 9.6 oz (105.1 kg)   BMI 31.41 kg/m    GEN: Well nourished, well developed, in no acute distress  HEENT: normal  Neck: no JVD, carotid bruits, or masses Cardiac: RRR; 1/6 to 2/6 systolic right upper sternal border murmur. No rub, gallop, or edema. Respiratory:  clear to auscultation bilaterally, normal work of breathing GI: soft, nontender, nondistended, + BS MS: no deformity or atrophy  Skin: warm and dry, no rash Neuro:  Alert and Oriented x 3, Strength and sensation are intact Psych: euthymic mood, full affect  Wt Readings from Last 3 Encounters:  03/04/17 231 lb 9.6 oz (105.1 kg)  01/07/17 227 lb (103 kg)  12/30/16 229 lb 12.8 oz (104.2 kg)      Studies/Labs Reviewed:   EKG:  EKG EKG is not repeated.  Recent Labs: 12/09/2016: ALT 15 12/16/2016: Magnesium 2.0 01/07/2017: BUN 25; Creatinine, Ser 1.46; Hemoglobin 11.8; NT-Pro BNP 474; Platelets 257; Potassium 4.4; Sodium 142   Lipid Panel No results found for: CHOL, TRIG, HDL, CHOLHDL, VLDL, LDLCALC, LDLDIRECT  Additional studies/ records that were reviewed today include:   2D Doppler echocardiogram October 2018: ------------------------------------------------------------------- Study Conclusions   - Left ventricle: The cavity size was mildly dilated. Systolic   function was mildly reduced. The estimated ejection fraction was   in the range of 45% to 50%. Wall motion was normal; there were no   regional wall motion abnormalities. Features are consistent with   a pseudonormal left ventricular filling pattern, with concomitant   abnormal relaxation and increased filling pressure (grade 2   diastolic dysfunction). - Aortic valve: A stent-valve (TAVR) bioprosthesis  was present and   functioning normally. Valve area (VTI): 1.41 cm^2. - Left atrium: The atrium was moderately to severely dilated.   Impressions:   - Slight increase in TAVR gradients noted.    ASSESSMENT:    1. Chronic combined systolic and diastolic heart failure ([place])   2. Essential hypertension   3. PVC's (premature ventricular contractions)   4. S/P TAVR (transcatheter aortic valve replacement)   5. Fatigue due to excessive exertion, initial encounter   6. Other dental procedure status      PLAN:  In order of problems listed above:  1. Graphic data demonstrates improvement in LV size and function when compared to pre-aortic valve replacement.  Valve function appears normal.  No regurgitation is noted. 2. Excellent blood pressure control  is noted. 3. Not apparent on today's exam. 4. Successful aortic valve replacement by transaortic percutaneous approach.  Normal valve function. 5. Continued exertional fatigue and decreased energy are likely related to deconditioning.  We have referred the patient to phase 2 cardiac rehab and strongly encouraged him to complete a 35-month program. 6. The patient has an upcoming dental procedure with Dr. Allyson Sabal.  He should receive endocarditis prophylaxis.  He is now 3 months out from aortic valve replacement and it is safe to proceed with the procedure.  No further evaluation is necessary.  Clinical follow-up in 4 months.  No change in the current medical regimen.    Medication Adjustments/Labs and Tests Ordered: Current medicines are reviewed at length with the patient today.  Concerns regarding medicines are outlined above.  Medication changes, Labs and Tests ordered today are listed in the Patient Instructions below. Patient Instructions  Medication Instructions:  Your physician recommends that you continue on your current medications as directed. Please refer to the Current Medication list given to you  today.  Labwork: None  Testing/Procedures: None  Follow-Up: Your physician recommends that you schedule a follow-up appointment in: 4 months with Dr. [name].    Any Other Special Instructions Will Be Listed Below (If Applicable).  You have been referred to Phase II Cardiac Rehab.   If you need a refill on your cardiac medications before your next appointment, please call your pharmacy.      Signed, Sinclair Grooms, MD  03/04/2017 4:48 PM    [facility] [place], [place], [place]  [address] Phone: [phone]; Fax: [phone]

## 2017-03-04 ENCOUNTER — Encounter: Payer: Self-pay | Admitting: Interventional Cardiology

## 2017-03-04 ENCOUNTER — Ambulatory Visit: Payer: Medicare Other | Admitting: Interventional Cardiology

## 2017-03-04 VITALS — BP 138/70 | HR 69 | Ht 72.0 in | Wt 231.6 lb

## 2017-03-04 DIAGNOSIS — I493 Ventricular premature depolarization: Secondary | ICD-10-CM

## 2017-03-04 DIAGNOSIS — Z952 Presence of prosthetic heart valve: Secondary | ICD-10-CM | POA: Diagnosis not present

## 2017-03-04 DIAGNOSIS — I5042 Chronic combined systolic (congestive) and diastolic (congestive) heart failure: Secondary | ICD-10-CM

## 2017-03-04 DIAGNOSIS — I10 Essential (primary) hypertension: Secondary | ICD-10-CM

## 2017-03-04 DIAGNOSIS — T733XXA Exhaustion due to excessive exertion, initial encounter: Secondary | ICD-10-CM

## 2017-03-04 DIAGNOSIS — Z98818 Other dental procedure status: Secondary | ICD-10-CM

## 2017-03-04 NOTE — Patient Instructions (Signed)
Medication Instructions:  Your physician recommends that you continue on your current medications as directed. Please refer to the Current Medication list given to you today.  Labwork: None  Testing/Procedures: None  Follow-Up: Your physician recommends that you schedule a follow-up appointment in: 4 months with Dr. [name].    Any Other Special Instructions Will Be Listed Below (If Applicable).  You have been referred to Phase II Cardiac Rehab.   If you need a refill on your cardiac medications before your next appointment, please call your pharmacy.

## 2017-03-09 ENCOUNTER — Telehealth (HOSPITAL_COMMUNITY): Payer: Self-pay

## 2017-03-09 NOTE — Telephone Encounter (Signed)
Patients insurance is active and benefits verified through [facility] - $20.00 co-pay, no deductible, out of pocket amount of $6,700/$753.41 has been met, no co-insurance, and no pre-authorization is required. Passport/reference [id number]  Patient will be contacted and scheduled.

## 2017-03-10 ENCOUNTER — Telehealth (HOSPITAL_COMMUNITY): Payer: Self-pay

## 2017-03-10 NOTE — Telephone Encounter (Signed)
Attempted to call patient in regards to Cardiac Rehab - Lm on Vm °

## 2017-03-11 NOTE — Telephone Encounter (Signed)
Will refax clearance to pts DDS for bolded note from last OV Dr [name] had with this pt. As indicated below in bold, the pt will need endocarditis prophylaxis.  Per Dr Thompson Caul Nurse, the pt stated that his Dentist will prescribe the antibiotic, and if there are any issues with getting this from the DDS, then he will call Dr [name] to advise on antibiotic needed.

## 2017-03-11 NOTE — Telephone Encounter (Signed)
  PLAN:  In order of problems listed above:  1. Graphic data demonstrates improvement in LV size and function when compared to pre-aortic valve replacement.  Valve function appears normal.  No regurgitation is noted. 2. Excellent blood pressure control is noted. 3. Not apparent on today's exam. 4. Successful aortic valve replacement by transaortic percutaneous approach.  Normal valve function. 5. Continued exertional fatigue and decreased energy are likely related to deconditioning.  We have referred the patient to phase 2 cardiac rehab and strongly encouraged him to complete a 70-month program. 6. The patient has an upcoming dental procedure with Dr. Allyson Sabal.  He should receive endocarditis prophylaxis.  He is now 3 months out from aortic valve replacement and it is safe to proceed with the procedure.  No further evaluation is necessary.  Clinical follow-up in 4 months.  No change in the current medical regimen

## 2017-03-17 ENCOUNTER — Telehealth (HOSPITAL_COMMUNITY): Payer: Self-pay

## 2017-03-17 NOTE — Telephone Encounter (Signed)
Called and spoke with wife of patient in regards to Cardiac Rehab - Wife stated patient is out for the day and will give [place] a call back.

## 2017-03-25 ENCOUNTER — Encounter (HOSPITAL_COMMUNITY): Payer: Self-pay

## 2017-03-25 ENCOUNTER — Encounter: Payer: Self-pay | Admitting: Physician Assistant

## 2017-03-25 ENCOUNTER — Telehealth (HOSPITAL_COMMUNITY): Payer: Self-pay

## 2017-03-25 NOTE — Telephone Encounter (Signed)
2nd attempt to call patient in regards to Cardiac Rehab - Lm on Vm. Sending letter. °

## 2017-03-30 DIAGNOSIS — C801 Malignant (primary) neoplasm, unspecified: Secondary | ICD-10-CM

## 2017-03-30 HISTORY — DX: Malignant (primary) neoplasm, unspecified: C80.1

## 2017-04-01 ENCOUNTER — Telehealth (HOSPITAL_COMMUNITY): Payer: Self-pay

## 2017-04-01 NOTE — Telephone Encounter (Signed)
3rd attempt to call patient in regards to Cardiac Rehab - lm on vm °

## 2017-04-09 ENCOUNTER — Telehealth (HOSPITAL_COMMUNITY): Payer: Self-pay

## 2017-04-09 NOTE — Telephone Encounter (Signed)
No response from patient in regards to Cardiac Rehab - Closed referral. °

## 2017-05-20 ENCOUNTER — Other Ambulatory Visit: Payer: Self-pay | Admitting: Interventional Cardiology

## 2017-05-20 ENCOUNTER — Encounter: Payer: Self-pay | Admitting: Interventional Cardiology

## 2017-05-20 MED ORDER — FUROSEMIDE 40 MG PO TABS: 40.0000 mg | ORAL_TABLET | Freq: Every day | ORAL | 3 refills | Status: DC

## 2017-05-20 MED ORDER — OLMESARTAN MEDOXOMIL 40 MG PO TABS: 40.0000 mg | ORAL_TABLET | Freq: Every day | ORAL | 3 refills | Status: DC

## 2017-05-20 NOTE — Telephone Encounter (Signed)
Pt's medication was sent to pt's pharmacy as requested. Confirmation received.  °

## 2017-05-30 ENCOUNTER — Other Ambulatory Visit: Payer: Self-pay | Admitting: Interventional Cardiology

## 2017-07-25 ENCOUNTER — Other Ambulatory Visit: Payer: Self-pay | Admitting: Interventional Cardiology

## 2017-07-27 DIAGNOSIS — R5383 Other fatigue: Secondary | ICD-10-CM | POA: Diagnosis not present

## 2017-07-27 DIAGNOSIS — I5040 Unspecified combined systolic (congestive) and diastolic (congestive) heart failure: Secondary | ICD-10-CM | POA: Diagnosis not present

## 2017-07-27 DIAGNOSIS — I10 Essential (primary) hypertension: Secondary | ICD-10-CM | POA: Diagnosis not present

## 2017-07-27 DIAGNOSIS — D649 Anemia, unspecified: Secondary | ICD-10-CM | POA: Diagnosis not present

## 2017-07-27 DIAGNOSIS — E7800 Pure hypercholesterolemia, unspecified: Secondary | ICD-10-CM | POA: Diagnosis not present

## 2017-07-27 DIAGNOSIS — Z952 Presence of prosthetic heart valve: Secondary | ICD-10-CM | POA: Diagnosis not present

## 2017-08-04 ENCOUNTER — Encounter: Payer: Self-pay | Admitting: Cardiovascular Disease

## 2017-09-05 ENCOUNTER — Other Ambulatory Visit: Payer: Self-pay | Admitting: Physician Assistant

## 2017-09-18 ENCOUNTER — Other Ambulatory Visit: Payer: Self-pay | Admitting: Interventional Cardiology

## 2017-09-20 NOTE — Telephone Encounter (Signed)
Yes

## 2017-09-20 NOTE — Telephone Encounter (Signed)
Pt's pharmacy is requesting a refill on Omega-3 (Lovaza) 1g capsule. Would Dr. [name] like to refill this medication? Please address

## 2017-10-13 NOTE — Progress Notes (Addendum)
Cardiology Office Note    Date:  10/14/2017   ID:  Robert Horn, DOB 02/04/1934, MRN [id number]  PCP:  [name], MD  Cardiologist: Sinclair Grooms, MD   Chief Complaint  Patient presents with  . Cardiac Valve Problem    History of Present Illness:  Robert Horn is a 84 y.o. male  with a history of chronic combined S/D CHF, HTN, HLD and severe aortic stenosis s/p TAVR (12/15/16).  Now on guideline determined therapy for HFrEF.  Robert Horn seems to be doing quite well.  He feels better now than when I last saw himIn 2017.  Overall he is active, continues to work, is not having orthopnea or edema.  He denies chest pain.  He is taking his medications as prescribed.  He denies palpitations.  No medication side effects.   Past Medical History:  Diagnosis Date  . CHF (congestive heart failure) ([place])   . Dyslipidemia 10/27/2015  . Dyspnea    w/ exertion   . Elevated PSA 10/27/2015  . Erectile dysfunction 10/27/2015  . Heart murmur   . Hypertension 10/27/2015  . Hypogonadism male 10/27/2015  . Obesity 10/27/2015  . Pneumonia 10/27/2015   pt states was 1982  . Rotator cuff tear 10/27/2015    Past Surgical History:  Procedure Laterality Date  . CARDIAC CATHETERIZATION N/A 11/14/2015   Procedure: Left Heart Cath and Coronary Angiography;  Surgeon: [name], MD;  Location: [facility];  Service: Cardiovascular;  Laterality: N/A;  . TEE WITHOUT CARDIOVERSION N/A 12/15/2016   Procedure: TRANSESOPHAGEAL ECHOCARDIOGRAM (TEE);  Surgeon: Sherren Mocha, MD;  Location: [facility];  Service: Open Heart Surgery;  Laterality: N/A;  . TRANSCATHETER AORTIC VALVE REPLACEMENT, TRANSFEMORAL N/A 12/15/2016   Procedure: TRANSCATHETER AORTIC VALVE REPLACEMENT, TRANSFEMORAL;  Surgeon: Sherren Mocha, MD;  Location: [facility];  Service: Open Heart Surgery;  Laterality: N/A;    Current Medications: Outpatient Medications Prior to Visit  Medication Sig Dispense Refill  . amLODipine  (NORVASC) 10 MG tablet TAKE 1 TABLET BY MOUTH EVERY DAY 30 tablet 6  . amoxicillin (AMOXIL) 500 MG tablet Take 1 tablet (500 mg total) by mouth as directed. 4 TABS 1 HOUR BEFORE DENTAL WORK 4 tablet 0  . aspirin (ASPIRIN EC) 81 MG EC tablet Take 81 mg by mouth daily. Swallow whole.    . carvedilol (COREG) 6.25 MG tablet TAKE 1 TABLET BY MOUTH TWICE A DAY 180 tablet 2  . clopidogrel (PLAVIX) 75 MG tablet TAKE 1 TABLET (75 MG TOTAL) BY MOUTH DAILY WITH BREAKFAST. 90 tablet 1  . furosemide (LASIX) 40 MG tablet Take 1 tablet (40 mg total) by mouth daily. 90 tablet 3  . hydrALAZINE (APRESOLINE) 25 MG tablet TAKE 1 TABLET BY MOUTH EVERY 8 HOURS 90 tablet 5  . Multiple Vitamin (MULTIVITAMIN WITH MINERALS) TABS tablet Take 1 tablet by mouth daily. 30 tablet 1  . olmesartan (BENICAR) 40 MG tablet Take 1 tablet (40 mg total) by mouth daily. 90 tablet 3  . omega-3 acid ethyl esters (LOVAZA) 1 g capsule TAKE ONE CAPSULE BY MOUTH EVERY DAY 30 capsule 5  . VITAMIN B COMPLEX-C PO Take 1 tablet by mouth daily.     No facility-administered medications prior to visit.      Allergies:   Patient has no known allergies.   Social History   Socioeconomic History  . Marital status: Single    Spouse name: Not on file  . Number of children: Not on file  .  Years of education: Not on file  . Highest education level: Not on file  Occupational History  . Not on file  Social Needs  . Financial resource strain: Not on file  . Food insecurity:    Worry: Not on file    Inability: Not on file  . Transportation needs:    Medical: Not on file    Non-medical: Not on file  Tobacco Use  . Smoking status: Former Smoker    Types: Cigarettes  . Smokeless tobacco: Never Used  Substance and Sexual Activity  . Alcohol use: Yes    Comment: rarely  . Drug use: No  . Sexual activity: Not on file  Lifestyle  . Physical activity:    Days per week: Not on file    Minutes per session: Not on file  . Stress: Not on file    Relationships  . Social connections:    Talks on phone: Not on file    Gets together: Not on file    Attends religious service: Not on file    Active member of club or organization: Not on file    Attends meetings of clubs or organizations: Not on file    Relationship status: Not on file  Other Topics Concern  . Not on file  Social History Narrative  . Not on file     Family History:  The patient's family history includes Diabetes in his mother; Heart disease in his mother; Pulmonary embolism in his father.   ROS:   Please see the history of present illness.    He is concerned because he was found to have anemia.  Hemoglobin documented on recent blood work was 9.3 in April 2019.  He has subsequently been started on iron.  GI work-up was recommended but he never followed through.  He has had some difficulty with rash.  Some hearing loss and shortness of breath with activity. All other systems reviewed and are negative.   PHYSICAL EXAM:   VS:  BP 134/66   Pulse 66   Ht 6' (1.829 m)   Wt 219 lb 9.6 oz (99.6 kg)   BMI 29.78 kg/m    GEN: Well nourished, well developed, in no acute distress  HEENT: normal  Neck: no JVD, carotid bruits, or masses Cardiac: RRR; there is 1/6 to 2/6 systolic murmur, no rubs, or gallops,no edema. Respiratory:  clear to auscultation bilaterally, normal work of breathing GI: soft, nontender, nondistended, + BS MS: no deformity or atrophy  Skin: warm and dry, no rash Neuro:  Alert and Oriented x 3, Strength and sensation are intact Psych: euthymic mood, full affect  Wt Readings from Last 3 Encounters:  10/14/17 219 lb 9.6 oz (99.6 kg)  03/04/17 231 lb 9.6 oz (105.1 kg)  01/07/17 227 lb (103 kg)      Studies/Labs Reviewed:   EKG:  EKG most recently performedOctober 11, 2018 demonstrated sinus bradycardia, first degree AV block, rightward axis, and inferior old lateral T wave inversion.  Recent Labs: 12/09/2016: ALT 15 12/16/2016: Magnesium  2.0 01/07/2017: BUN 25; Creatinine, Ser 1.46; Hemoglobin 11.8; NT-Pro BNP 474; Platelets 257; Potassium 4.4; Sodium 142   Lipid Panel No results found for: CHOL, TRIG, HDL, CHOLHDL, VLDL, LDLCALC, LDLDIRECT  Additional studies/ records that were reviewed today include:  None    ASSESSMENT:    1. Chronic combined systolic and diastolic heart failure ([place])   2. S/P TAVR (transcatheter aortic valve replacement)   3. PVC's (premature ventricular contractions)  4. Essential hypertension   5. Iron deficiency anemia due to chronic blood loss   6. Hyperlipidemia with target LDL less than 70      PLAN:  In order of problems listed above:  1. Chronic combined systolic and diastolic heart failure, clinically stable at this time.  Dyspnea has improved.  Dyspnea last fall and sprain could have been related to the development of anemia.  On iron he is feeling better.  He has not followed through with the GI work-up as recommended by Dr. Delfina Redwood.  I have encouraged him to recontact Dr. Delfina Redwood and have the GI work-up done to exclude the possibility of malignancy, ulcer, or other significant problem. 2. He needs to have follow-up with the structural heart team later this fall. 3. Not currently present based on auscultation. 4. Blood pressure control is excellent. 5. I have encouraged him to follow through on the GI work-up recommended by Dr. Delfina Redwood.  He needs upper lower GI evaluation to exclude ulcer/malignancy.  I discussed this in detail with patient terms of he could understand with reference to the significance of the finding.  He should continue iron until otherwise instructed by Dr. Delfina Redwood or gastroenterology. 6. Noticed on laboratory panel from the POC recommendation report and no in April LDL of 159.  He needs to be placed on statin therapy.  We will refer this back to his primary physician.  If need be, we will start therapy.  He has nonobstructive coronary disease.    Medication  Adjustments/Labs and Tests Ordered: Current medicines are reviewed at length with the patient today.  Concerns regarding medicines are outlined above.  Medication changes, Labs and Tests ordered today are listed in the Patient Instructions below. Patient Instructions  Medication Instructions:  Your physician recommends that you continue on your current medications as directed. Please refer to the Current Medication list given to you today.  Labwork: None  Testing/Procedures: None  Follow-Up: Your physician wants you to follow-up in: 6 months with Dr. [name].  You will receive a reminder letter in the mail two months in advance. If you don't receive a letter, please call our office to schedule the follow-up appointment.   Any Other Special Instructions Will Be Listed Below (If Applicable).     If you need a refill on your cardiac medications before your next appointment, please call your pharmacy.      Signed, Sinclair Grooms, MD  10/14/2017 5:59 PM    [facility] [place], [place], [place]  [address] Phone: [phone]; Fax: [phone]

## 2017-10-14 ENCOUNTER — Ambulatory Visit (INDEPENDENT_AMBULATORY_CARE_PROVIDER_SITE_OTHER): Payer: Medicare Other | Admitting: Interventional Cardiology

## 2017-10-14 ENCOUNTER — Encounter: Payer: Self-pay | Admitting: Interventional Cardiology

## 2017-10-14 VITALS — BP 134/66 | HR 66 | Ht 72.0 in | Wt 219.6 lb

## 2017-10-14 DIAGNOSIS — I10 Essential (primary) hypertension: Secondary | ICD-10-CM

## 2017-10-14 DIAGNOSIS — D500 Iron deficiency anemia secondary to blood loss (chronic): Secondary | ICD-10-CM

## 2017-10-14 DIAGNOSIS — Z952 Presence of prosthetic heart valve: Secondary | ICD-10-CM

## 2017-10-14 DIAGNOSIS — I5042 Chronic combined systolic (congestive) and diastolic (congestive) heart failure: Secondary | ICD-10-CM | POA: Diagnosis not present

## 2017-10-14 DIAGNOSIS — I493 Ventricular premature depolarization: Secondary | ICD-10-CM | POA: Diagnosis not present

## 2017-10-14 DIAGNOSIS — E785 Hyperlipidemia, unspecified: Secondary | ICD-10-CM

## 2017-10-14 NOTE — Patient Instructions (Signed)

## 2017-10-27 ENCOUNTER — Other Ambulatory Visit: Payer: Self-pay

## 2017-10-27 DIAGNOSIS — I350 Nonrheumatic aortic (valve) stenosis: Secondary | ICD-10-CM

## 2017-10-27 DIAGNOSIS — Z952 Presence of prosthetic heart valve: Secondary | ICD-10-CM

## 2017-10-27 DIAGNOSIS — R911 Solitary pulmonary nodule: Secondary | ICD-10-CM

## 2017-11-25 DIAGNOSIS — D509 Iron deficiency anemia, unspecified: Secondary | ICD-10-CM | POA: Diagnosis not present

## 2017-11-26 ENCOUNTER — Telehealth: Payer: Self-pay

## 2017-11-26 NOTE — Telephone Encounter (Signed)
   [facility] Pre-operative Risk Assessment    Request for surgical clearance:  1. What type of surgery is being performed?  Colonoscopy/Endoscopy   2. When is this surgery scheduled? 12/09/17   3. What type of clearance is required (medical clearance vs. Pharmacy clearance to hold med vs. Both)?  Both  4. Are there any medications that need to be held prior to surgery and how long? Plavix   5. Practice name and name of physician performing surgery? [facility]/Dr Schooler   6. What is your office phone number [phone]    7.   What is your office fax number [phone]  8.   Anesthesia type (None, local, MAC, general) ? MAC   Robert Horn 11/26/2017, 11:39 AM  _________________________________________________________________   (provider comments below)

## 2017-11-30 NOTE — Telephone Encounter (Signed)
   Primary Cardiologist: Sinclair Grooms, MD  Chart reviewed as part of pre-operative protocol coverage. Patient was contacted 11/30/2017 in reference to pre-operative risk assessment for pending surgery as outlined below.  Robert Horn was last seen on 10/14/17 by Dr. [name].  Left voice mail to call between pre-op hours.   [place], [place] 11/30/2017, 3:14 PM

## 2017-11-30 NOTE — Telephone Encounter (Signed)
Patient returned call. He is going well on cardiac standpoint since last seen by Dr. [name]. He has stable dyspnea with extreme exertion for past many months. No chest pain. Getting > 4Mets of activity.   Given past medical history and time since last visit, based on ACC/AHA guidelines, Robert Horn would be at acceptable risk for the planned procedure without further cardiovascular testing.   Will ask Dr. [name] if okay to hold plavix and how long.   [place], [place] 11/30/2017, 4:40 PM

## 2017-12-02 NOTE — Telephone Encounter (Signed)
5 days off plavix.

## 2017-12-02 NOTE — Telephone Encounter (Signed)
I  will route this recommendation to the requesting party via Epic fax function and remove from pre-op pool.  Please call with questions.  [place], [place] 12/02/2017, 1:15 PM

## 2017-12-09 DIAGNOSIS — K2900 Acute gastritis without bleeding: Secondary | ICD-10-CM | POA: Diagnosis not present

## 2017-12-09 DIAGNOSIS — C184 Malignant neoplasm of transverse colon: Secondary | ICD-10-CM | POA: Diagnosis not present

## 2017-12-09 DIAGNOSIS — D509 Iron deficiency anemia, unspecified: Secondary | ICD-10-CM | POA: Diagnosis not present

## 2017-12-09 DIAGNOSIS — K635 Polyp of colon: Secondary | ICD-10-CM | POA: Diagnosis not present

## 2017-12-09 DIAGNOSIS — K56691 Other complete intestinal obstruction: Secondary | ICD-10-CM | POA: Diagnosis not present

## 2017-12-09 DIAGNOSIS — K640 First degree hemorrhoids: Secondary | ICD-10-CM | POA: Diagnosis not present

## 2017-12-10 ENCOUNTER — Inpatient Hospital Stay (HOSPITAL_COMMUNITY): Payer: Medicare Other

## 2017-12-10 ENCOUNTER — Encounter (HOSPITAL_COMMUNITY): Payer: Self-pay | Admitting: General Practice

## 2017-12-10 ENCOUNTER — Inpatient Hospital Stay (HOSPITAL_COMMUNITY)
Admission: AD | Admit: 2017-12-10 | Discharge: 2017-12-18 | DRG: 330 | Disposition: A | Discharge status: Home / Self Care | Payer: Medicare Other | Attending: Internal Medicine | Admitting: Internal Medicine

## 2017-12-10 ENCOUNTER — Other Ambulatory Visit: Payer: Self-pay

## 2017-12-10 DIAGNOSIS — N179 Acute kidney failure, unspecified: Secondary | ICD-10-CM | POA: Diagnosis not present

## 2017-12-10 DIAGNOSIS — D649 Anemia, unspecified: Secondary | ICD-10-CM | POA: Diagnosis present

## 2017-12-10 DIAGNOSIS — I350 Nonrheumatic aortic (valve) stenosis: Secondary | ICD-10-CM | POA: Diagnosis not present

## 2017-12-10 DIAGNOSIS — C188 Malignant neoplasm of overlapping sites of colon: Secondary | ICD-10-CM | POA: Diagnosis not present

## 2017-12-10 DIAGNOSIS — K6389 Other specified diseases of intestine: Secondary | ICD-10-CM | POA: Diagnosis not present

## 2017-12-10 DIAGNOSIS — I5042 Chronic combined systolic (congestive) and diastolic (congestive) heart failure: Secondary | ICD-10-CM

## 2017-12-10 DIAGNOSIS — C182 Malignant neoplasm of ascending colon: Secondary | ICD-10-CM | POA: Diagnosis present

## 2017-12-10 DIAGNOSIS — C189 Malignant neoplasm of colon, unspecified: Secondary | ICD-10-CM | POA: Diagnosis not present

## 2017-12-10 DIAGNOSIS — K639 Disease of intestine, unspecified: Secondary | ICD-10-CM | POA: Diagnosis not present

## 2017-12-10 DIAGNOSIS — I10 Essential (primary) hypertension: Secondary | ICD-10-CM

## 2017-12-10 DIAGNOSIS — D6489 Other specified anemias: Secondary | ICD-10-CM | POA: Diagnosis not present

## 2017-12-10 DIAGNOSIS — D490 Neoplasm of unspecified behavior of digestive system: Secondary | ICD-10-CM | POA: Diagnosis not present

## 2017-12-10 DIAGNOSIS — Z8249 Family history of ischemic heart disease and other diseases of the circulatory system: Secondary | ICD-10-CM | POA: Diagnosis not present

## 2017-12-10 DIAGNOSIS — I472 Ventricular tachycardia: Secondary | ICD-10-CM | POA: Diagnosis present

## 2017-12-10 DIAGNOSIS — I110 Hypertensive heart disease with heart failure: Secondary | ICD-10-CM | POA: Diagnosis not present

## 2017-12-10 DIAGNOSIS — C184 Malignant neoplasm of transverse colon: Principal | ICD-10-CM | POA: Diagnosis present

## 2017-12-10 DIAGNOSIS — Z7902 Long term (current) use of antithrombotics/antiplatelets: Secondary | ICD-10-CM | POA: Diagnosis not present

## 2017-12-10 DIAGNOSIS — Z01810 Encounter for preprocedural cardiovascular examination: Secondary | ICD-10-CM

## 2017-12-10 DIAGNOSIS — Z87891 Personal history of nicotine dependence: Secondary | ICD-10-CM | POA: Diagnosis not present

## 2017-12-10 DIAGNOSIS — E785 Hyperlipidemia, unspecified: Secondary | ICD-10-CM | POA: Diagnosis present

## 2017-12-10 DIAGNOSIS — Z7982 Long term (current) use of aspirin: Secondary | ICD-10-CM

## 2017-12-10 DIAGNOSIS — Z952 Presence of prosthetic heart valve: Secondary | ICD-10-CM

## 2017-12-10 DIAGNOSIS — Z79899 Other long term (current) drug therapy: Secondary | ICD-10-CM

## 2017-12-10 LAB — CBC
HEMATOCRIT: 32.6 % — AB (ref 39.0–52.0)
Hemoglobin: 10.1 g/dL — ABNORMAL LOW (ref 13.0–17.0)
MCH: 27.2 pg (ref 26.0–34.0)
MCHC: 31 g/dL (ref 30.0–36.0)
MCV: 87.6 fL (ref 78.0–100.0)
PLATELETS: 143 10*3/uL — AB (ref 150–400)
RBC: 3.72 MIL/uL — ABNORMAL LOW (ref 4.22–5.81)
RDW: 14.2 % (ref 11.5–15.5)
WBC: 4.5 10*3/uL (ref 4.0–10.5)

## 2017-12-10 LAB — CREATININE, SERUM
CREATININE: 1.47 mg/dL — AB (ref 0.61–1.24)
GFR calc Af Amer: 49 mL/min — ABNORMAL LOW (ref >60)
GFR calc non Af Amer: 43 mL/min — ABNORMAL LOW (ref >60)

## 2017-12-10 LAB — ECHOCARDIOGRAM COMPLETE
Height: 72 in
Weight: 3289.6 oz

## 2017-12-10 MED ORDER — POLYETHYLENE GLYCOL 3350 17 G PO PACK: 17.0000 g | PACK | Freq: Every day | ORAL | Status: DC | PRN

## 2017-12-10 MED ORDER — ACETAMINOPHEN 325 MG PO TABS: 650.0000 mg | ORAL_TABLET | Freq: Four times a day (QID) | ORAL | Status: DC | PRN

## 2017-12-10 MED ORDER — IOHEXOL 300 MG/ML  SOLN
100.0000 mL | Freq: Once | INTRAMUSCULAR | Status: AC | PRN
  Administered 2017-12-10: 100 mL via INTRAVENOUS

## 2017-12-10 MED ORDER — SODIUM CHLORIDE 0.9 % IV SOLN: 250.0000 mL | INTRAVENOUS | Status: DC | PRN

## 2017-12-10 MED ORDER — ACETAMINOPHEN 650 MG RE SUPP: 650.0000 mg | Freq: Four times a day (QID) | RECTAL | Status: DC | PRN

## 2017-12-10 MED ORDER — HEPARIN SODIUM (PORCINE) 5000 UNIT/ML IJ SOLN
5000.0000 [IU] | Freq: Three times a day (TID) | INTRAMUSCULAR | Status: DC
  Administered 2017-12-10 – 2017-12-14 (×13): 5000 [IU] via SUBCUTANEOUS
  Filled 2017-12-10 (×11): qty 1

## 2017-12-10 MED ORDER — CARVEDILOL 6.25 MG PO TABS
6.2500 mg | ORAL_TABLET | Freq: Two times a day (BID) | ORAL | Status: DC
  Administered 2017-12-10: 6.25 mg via ORAL
  Filled 2017-12-10 (×2): qty 1

## 2017-12-10 MED ORDER — HYDRALAZINE HCL 25 MG PO TABS
25.0000 mg | ORAL_TABLET | Freq: Three times a day (TID) | ORAL | Status: DC
  Administered 2017-12-10 – 2017-12-18 (×24): 25 mg via ORAL
  Filled 2017-12-10 (×24): qty 1

## 2017-12-10 MED ORDER — IOPAMIDOL (ISOVUE-300) INJECTION 61%
INTRAVENOUS | Status: AC
  Filled 2017-12-10: qty 30

## 2017-12-10 MED ORDER — ONDANSETRON HCL 4 MG/2ML IJ SOLN
4.0000 mg | Freq: Four times a day (QID) | INTRAMUSCULAR | Status: DC | PRN
  Administered 2017-12-18 (×2): 4 mg via INTRAVENOUS
  Filled 2017-12-10 (×3): qty 2

## 2017-12-10 MED ORDER — SODIUM CHLORIDE 0.9% FLUSH
3.0000 mL | Freq: Two times a day (BID) | INTRAVENOUS | Status: DC
  Administered 2017-12-10 – 2017-12-17 (×9): 3 mL via INTRAVENOUS

## 2017-12-10 MED ORDER — SODIUM CHLORIDE 0.9% FLUSH: 3.0000 mL | INTRAVENOUS | Status: DC | PRN

## 2017-12-10 MED ORDER — ONDANSETRON HCL 4 MG PO TABS: 4.0000 mg | ORAL_TABLET | Freq: Four times a day (QID) | ORAL | Status: DC | PRN

## 2017-12-10 MED ORDER — AMLODIPINE BESYLATE 10 MG PO TABS
10.0000 mg | ORAL_TABLET | Freq: Every day | ORAL | Status: DC
  Administered 2017-12-11 – 2017-12-18 (×7): 10 mg via ORAL
  Filled 2017-12-10 (×7): qty 1

## 2017-12-10 NOTE — Treatment Plan (Signed)
Received call from Dr. Michail Sermon. Pathology has returned adenocarcinoma

## 2017-12-10 NOTE — H&P (Signed)
History and Physical    Robert Horn ZOX:[id number] DOB: [birth year] DOA: 12/10/2017  PCP: [name], MD  Patient coming from: Home   Chief Complaint: Status post EGD that showed colonic mass obstruction.  HPI: Robert Horn is a 84 y.o. male with medical history significant of hypertension, dyslipidemia status post TAVRS in 12/15/2016 by Dr. [name], systolic and diastolic heart failure with an EF of 45% hypertension that was sent here from the gastroenterologist office Dr. Michail Sermon as he did a colonoscopy which showed a colonic obstructive mass.  The patient denies any pain weight loss shortness of breath or fevers.   Review of Systems: As per HPI otherwise 10 point review of systems negative.    Past Medical History:  Diagnosis Date  . CHF (congestive heart failure) ([place])   . Dyslipidemia 10/27/2015  . Dyspnea    w/ exertion   . Elevated PSA 10/27/2015  . Erectile dysfunction 10/27/2015  . Heart murmur   . Hypertension 10/27/2015  . Hypogonadism male 10/27/2015  . Obesity 10/27/2015  . Pneumonia 10/27/2015   pt states was 1982  . Rotator cuff tear 10/27/2015    Past Surgical History:  Procedure Laterality Date  . CARDIAC CATHETERIZATION N/A 11/14/2015   Procedure: Left Heart Cath and Coronary Angiography;  Surgeon: [name], MD;  Location: [facility];  Service: Cardiovascular;  Laterality: N/A;  . TEE WITHOUT CARDIOVERSION N/A 12/15/2016   Procedure: TRANSESOPHAGEAL ECHOCARDIOGRAM (TEE);  Surgeon: Sherren Mocha, MD;  Location: [place];  Service: Open Heart Surgery;  Laterality: N/A;  . TRANSCATHETER AORTIC VALVE REPLACEMENT, TRANSFEMORAL N/A 12/15/2016   Procedure: TRANSCATHETER AORTIC VALVE REPLACEMENT, TRANSFEMORAL;  Surgeon: Sherren Mocha, MD;  Location: [place];  Service: Open Heart Surgery;  Laterality: N/A;     reports that he has quit smoking. His smoking use included cigarettes. He has never used smokeless tobacco. He reports that he drinks alcohol.  He reports that he does not use drugs.  No Known Allergies  Family History  Problem Relation Age of Onset  . Diabetes Mother   . Heart disease Mother   . Pulmonary embolism Father      Prior to Admission medications   Medication Sig Start Date End Date Taking? Authorizing Provider  amLODipine (NORVASC) 10 MG tablet TAKE 1 TABLET BY MOUTH EVERY DAY 07/27/17  Yes [name], MD  amoxicillin (AMOXIL) 500 MG tablet Take 1 tablet (500 mg total) by mouth as directed. 4 TABS 1 HOUR BEFORE DENTAL WORK 01/07/17  Yes Eileen Stanford, PA-C  aspirin (ASPIRIN EC) 81 MG EC tablet Take 81 mg by mouth daily. Swallow whole.   Yes [provider]  carvedilol (COREG) 6.25 MG tablet TAKE 1 TABLET BY MOUTH TWICE A DAY 05/31/17  Yes [name], MD  clopidogrel (PLAVIX) 75 MG tablet TAKE 1 TABLET (75 MG TOTAL) BY MOUTH DAILY WITH BREAKFAST. 09/06/17  Yes Eileen Stanford, PA-C  furosemide (LASIX) 40 MG tablet Take 1 tablet (40 mg total) by mouth daily. 05/20/17  Yes [name], MD  hydrALAZINE (APRESOLINE) 25 MG tablet TAKE 1 TABLET BY MOUTH EVERY 8 HOURS 09/20/17  Yes [name], MD  Multiple Vitamin (MULTIVITAMIN WITH MINERALS) TABS tablet Take 1 tablet by mouth daily. 10/27/15  Yes Bonnell Public, MD  olmesartan (BENICAR) 40 MG tablet Take 1 tablet (40 mg total) by mouth daily. 05/20/17  Yes [name], MD  omega-3 acid ethyl esters (LOVAZA) 1 g capsule TAKE ONE CAPSULE  BY MOUTH EVERY DAY 09/20/17  Yes [name], MD  VITAMIN B COMPLEX-C PO Take 1 tablet by mouth daily.   Yes [provider]    Physical Exam: Vitals:   12/10/17 0941  BP: 119/76  Pulse: (!) 52  Temp: 98.2 F (36.8 C)  TempSrc: Oral  SpO2: 100%  Weight: 93.3 kg  Height: 6' (1.829 m)    Constitutional: NAD, calm, comfortable Vitals:   12/10/17 0941  BP: 119/76  Pulse: (!) 52  Temp: 98.2 F (36.8 C)  TempSrc: Oral  SpO2: 100%  Weight: 93.3 kg  Height: 6' (1.829 m)   Eyes:  PERRL, lids and conjunctivae normal ENMT: Mucous membranes are moist.  Respiratory: Good air movement and clear to auscultation. Cardiovascular: Rate and rhythm with positive S1-S2 no murmurs rubs or gallops appreciated. Abdomen: Bowel sounds soft nontender nondistended Musculoskeletal: no clubbing / cyanosis. No joint deformity upper and lower extremities. Good ROM, no contractures. Normal muscle tone.  Skin: no rashes, lesions, ulcers. No induration Neurologic: CN 2-12 grossly intact. Sensation intact, DTR normal. Strength 5/5 in all 4.  Psychiatric: Normal judgment and insight. Alert and oriented x 3. Normal mood.     Labs on Admission: I have personally reviewed following labs and imaging studies  CBC: No results for input(s): WBC, NEUTROABS, HGB, HCT, MCV, PLT in the last 168 hours. Basic Metabolic Panel: No results for input(s): NA, K, CL, CO2, GLUCOSE, BUN, CREATININE, CALCIUM, MG, PHOS in the last 168 hours. GFR: CrCl cannot be calculated (Patient's most recent lab result is older than the maximum 21 days allowed.). Liver Function Tests: No results for input(s): AST, ALT, ALKPHOS, BILITOT, PROT, ALBUMIN in the last 168 hours. No results for input(s): LIPASE, AMYLASE in the last 168 hours. No results for input(s): AMMONIA in the last 168 hours. Coagulation Profile: No results for input(s): INR, PROTIME in the last 168 hours. Cardiac Enzymes: No results for input(s): CKTOTAL, CKMB, CKMBINDEX, TROPONINI in the last 168 hours. BNP (last 3 results) Recent Labs    01/07/17 1451  PROBNP 474   HbA1C: No results for input(s): HGBA1C in the last 72 hours. CBG: No results for input(s): GLUCAP in the last 168 hours. Lipid Profile: No results for input(s): CHOL, HDL, LDLCALC, TRIG, CHOLHDL, LDLDIRECT in the last 72 hours. Thyroid Function Tests: No results for input(s): TSH, T4TOTAL, FREET4, T3FREE, THYROIDAB in the last 72 hours. Anemia Panel: No results for input(s):  VITAMINB12, FOLATE, FERRITIN, TIBC, IRON, RETICCTPCT in the last 72 hours. Urine analysis:    Component Value Date/Time   COLORURINE YELLOW 12/09/2016 1434   APPEARANCEUR HAZY (A) 12/09/2016 1434   LABSPEC 1.017 12/09/2016 1434   PHURINE 5.0 12/09/2016 1434   GLUCOSEU NEGATIVE 12/09/2016 1434   HGBUR NEGATIVE 12/09/2016 1434   [place] 12/09/2016 1434   KETONESUR NEGATIVE 12/09/2016 1434   PROTEINUR NEGATIVE 12/09/2016 1434   NITRITE NEGATIVE 12/09/2016 1434   LEUKOCYTESUR TRACE (A) 12/09/2016 1434    Radiological Exams on Admission: No results found.  EKG: Independently reviewed.  Pending at the time of this dictation.  Assessment/Plan Colonic mass: - We will start staging him, will get a CT scan abdomen and pelvis.  2D echo. - We will keep in a full liquid diet. - Hold aspirin and Plavix. - We have consulted surgery - Also consulted cardiology for cardiac preop evaluation.  Essential hypertension - We will continue all of his medication except for his Lasix and ARB. - Hydralazine IV as needed.  Chronic combined systolic and diastolic heart failure ([place]): - We will continue his medication except for Lasix and ARB as he is probably going to surgical intervention on 12/11/2017.  S/P TAVR (transcatheter aortic valve replacement) - Hold aspirin and Plavix for possible surgical intervention.    DVT prophylaxis: Lovenox Code Status: full Family Communication: wife Disposition Plan: unable to determine Consults called: Surgery, cardiology Admission status: inpatient  It is my clinical opinion that admission to INPATIENT is reasonable and necessary in this 84 y.o. male . presenting with a recent colonoscopy that showed a colonic obstructive mass will need to stay more than 2 days as he needs surgical intervention . in the context of PMH including: TARVS and systolic heart failure  Given the aforementioned, the predictability of an adverse outcome is felt to be  significant. I expect that the patient will require at least 2 midnights in the hospital to treat this condition.  Charlynne Cousins MD [facility] Pager [phone]  If 7PM-7AM, please contact night-coverage [email or web address] Password [facility]  12/10/2017, 11:04 AM

## 2017-12-10 NOTE — Progress Notes (Signed)
  Echocardiogram 2D Echocardiogram has been performed.  Jennette Dubin 12/10/2017, 3:20 PM

## 2017-12-10 NOTE — Progress Notes (Addendum)
[facility] flow manager text paged on admit.  Dr. Evangeline Gula text paged via [facility] for admission orders. Asked to call [facility] flow manager for admit pt.

## 2017-12-10 NOTE — Consult Note (Addendum)
[facility] Surgery Consult Note  Robert Horn 1934/01/30  [id number].    Requesting MD: Aileen Fass, MD Chief Complaint/Reason for Consult: Colon mass   HPI:  Robert Horn is an 84 y/o M with a PMH HTN, TAVR for aortic stenosis, and CHF who presented to [facility] for management of colon mass. The patient reports having a colonoscopy yesterday 12/09/17 as part of a workup for his anemia, discovered by his PCP. The colonoscopy revealed a malignant appearing mass in the transverse colon that could not be passed with a scope and the patient was advised to go to the hospital for surgical management. He reports a daily, soft and formed BM that only recently (1-2 weeks ago) became smaller in caliber.The patient denies hematochezia or melena. He denies poor appetite, nausea, or vomiting. He reports having increasing fatigue/DOE since his TAVR 2 years ago. He endorses about 30 lbs of weight loss in 2 months, was intentionally trying to lose weight at this time and eating more salad and less processed food. He denies a personal history of cancer or diverticulitis.  He takes ASA and plavix for his history of valve replacement, this was held 5 days prior to his colonoscopy on 9/12. 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 He lives with his wife, who is at bedside today during my exam.  Colonoscopy and endoscopy reports are at patients bedside.   ROS: Review of Systems  Constitutional: Positive for malaise/fatigue and weight loss.  Gastrointestinal: Negative for abdominal pain, blood in stool, constipation, diarrhea, melena, nausea and vomiting.  Genitourinary: Negative for dysuria and hematuria.  All other systems reviewed and are negative.   Family History  Problem Relation Age of Onset  . Diabetes Mother   . Heart disease Mother   . Pulmonary embolism Father     Past Medical History:  Diagnosis Date  . CHF (congestive heart failure) ([place])   . Dyslipidemia  10/27/2015  . Dyspnea    w/ exertion   . Elevated PSA 10/27/2015  . Erectile dysfunction 10/27/2015  . Heart murmur   . Hypertension 10/27/2015  . Hypogonadism male 10/27/2015  . Obesity 10/27/2015  . Pneumonia 10/27/2015   pt states was 1982  . Rotator cuff tear 10/27/2015    Past Surgical History:  Procedure Laterality Date  . CARDIAC CATHETERIZATION N/A 11/14/2015   Procedure: Left Heart Cath and Coronary Angiography;  Surgeon: [name], MD;  Location: [place] [facility];  Service: Cardiovascular;  Laterality: N/A;  . TEE WITHOUT CARDIOVERSION N/A 12/15/2016   Procedure: TRANSESOPHAGEAL ECHOCARDIOGRAM (TEE);  Surgeon: Sherren Mocha, MD;  Location: [place];  Service: Open Heart Surgery;  Laterality: N/A;  . TRANSCATHETER AORTIC VALVE REPLACEMENT, TRANSFEMORAL N/A 12/15/2016   Procedure: TRANSCATHETER AORTIC VALVE REPLACEMENT, TRANSFEMORAL;  Surgeon: Sherren Mocha, MD;  Location: [place];  Service: Open Heart Surgery;  Laterality: N/A;    Social History:  reports that he has quit smoking. His smoking use included cigarettes. He has never used smokeless tobacco. He reports that he drinks alcohol. He reports that he does not use drugs.  Allergies: No Known Allergies  Medications Prior to Admission  Medication Sig Dispense Refill  . amLODipine (NORVASC) 10 MG tablet TAKE 1 TABLET BY MOUTH EVERY DAY 30 tablet 6  . amoxicillin (AMOXIL) 500 MG tablet Take 1 tablet (500 mg total) by mouth as directed. 4 TABS 1 HOUR BEFORE DENTAL WORK 4 tablet 0  . aspirin (ASPIRIN EC) 81  MG EC tablet Take 81 mg by mouth daily. Swallow whole.    . carvedilol (COREG) 6.25 MG tablet TAKE 1 TABLET BY MOUTH TWICE A DAY 180 tablet 2  . clopidogrel (PLAVIX) 75 MG tablet TAKE 1 TABLET (75 MG TOTAL) BY MOUTH DAILY WITH BREAKFAST. 90 tablet 1  . furosemide (LASIX) 40 MG tablet Take 1 tablet (40 mg total) by mouth daily. 90 tablet 3  . hydrALAZINE (APRESOLINE) 25 MG tablet TAKE 1 TABLET BY MOUTH EVERY 8 HOURS 90  tablet 5  . Multiple Vitamin (MULTIVITAMIN WITH MINERALS) TABS tablet Take 1 tablet by mouth daily. 30 tablet 1  . olmesartan (BENICAR) 40 MG tablet Take 1 tablet (40 mg total) by mouth daily. 90 tablet 3  . omega-3 acid ethyl esters (LOVAZA) 1 g capsule TAKE ONE CAPSULE BY MOUTH EVERY DAY 30 capsule 5  . VITAMIN B COMPLEX-C PO Take 1 tablet by mouth daily.      Blood pressure 119/76, pulse (!) 52, temperature 98.2 F (36.8 C), temperature source Oral, height 6' (1.829 m), weight 93.3 kg, SpO2 100 %. Physical Exam: Physical Exam  Constitutional: He is oriented to person, place, and time. He appears well-developed and well-nourished. No distress.  HENT:  Head: Normocephalic and atraumatic.  Right Ear: External ear normal.  Left Ear: External ear normal.  Mouth/Throat: Oropharynx is clear and moist. No oropharyngeal exudate.  Eyes: Pupils are equal, round, and reactive to light. EOM are normal. Right eye exhibits no discharge. Left eye exhibits no discharge. No scleral icterus.  Neck: Normal range of motion. Neck supple. No tracheal deviation present. No thyromegaly present.  Cardiovascular: Normal rate, regular rhythm, normal heart sounds and intact distal pulses.  Pulmonary/Chest: Effort normal and breath sounds normal. No stridor. No respiratory distress. He has no wheezes.  Abdominal: Soft. Bowel sounds are normal. He exhibits no distension and no mass. There is no tenderness. There is no guarding.  Musculoskeletal: Normal range of motion. He exhibits no tenderness or deformity.  Neurological: He is alert and oriented to person, place, and time. No cranial nerve deficit.  Skin: Skin is warm and dry. No rash noted. He is not diaphoretic.  Psychiatric: He has a normal mood and affect. His behavior is normal.   No results found for this or any previous visit (from the past 48 hour(s)). No results found.  Assessment/Plan HTN Chronic combined systolic and diastolic heart failure S/P  TAVR - hold plavix and ASA  Colon mass - Malignant appearing on scope by Dr. Michail Sermon 9/12; tattooed; follow path - [facility], CT chest/abd pending   - currently having bowel function without clinical signs of obstruction but, given near-complete obstruction on colonoscopy, recommend partial colectomy, possibly colostomy this admission.  - will discuss timing of surgery with MD, likely early next week.  FEN: full liquids ID: none VTE: SCD's, SQ heparin    Jill Alexanders, [facility] Surgery 12/10/2017, 11:22 AM Pager: [phone] Consults: [phone]

## 2017-12-10 NOTE — Consult Note (Signed)
Cardiology Consultation:   Patient ID: Robert Horn MRN: [id number]; DOB: [birth year]  Admit date: 12/10/2017 Date of Consult: 12/10/2017  Primary Care Provider: [name], MD Primary Cardiologist: Sinclair Grooms, MD   Patient Profile:   Robert Horn is a 84 y.o. male with a hx of HTN, TAVR 11/2016, CHF who is being seen today for preop evaluation prior to colon surgery at the request of Dr. Aileen Fass.  History of Present Illness:   Robert Horn was cleared at our office for EDG/colonoscopy. He underwent colonoscopy on 12/09/17 per surgery note as part of anemia workup and was found to have a malignant appearing mass in the transverse colon. He was advised to go to the hospital for surgical management.   Robert Horn was last seen in the office on 10/14/17 by Dr. [name] and was doing well. He is active and continues to work.   He held both aspirin and clopidogrel for 5 days prior to his procedure. He denies chest pain, shortness of breath, PND, or orthopnea. He is very active at baseline, recently golfing. He describes being at a football game, which required walking 2 miles round trip and climbing up and down 5 flights of stairs. He did not have any issues with this, no chest pain or shortness of breath.  Past Medical History:  Diagnosis Date  . CHF (congestive heart failure) ([place])   . Dyslipidemia 10/27/2015  . Dyspnea    w/ exertion   . Elevated PSA 10/27/2015  . Erectile dysfunction 10/27/2015  . Heart murmur   . Hypertension 10/27/2015  . Hypogonadism male 10/27/2015  . Obesity 10/27/2015  . Pneumonia 10/27/2015   pt states was 1982  . Rotator cuff tear 10/27/2015    Past Surgical History:  Procedure Laterality Date  . CARDIAC CATHETERIZATION N/A 11/14/2015   Procedure: Left Heart Cath and Coronary Angiography;  Surgeon: [name], MD;  Location: [facility];  Service: Cardiovascular;  Laterality: N/A;  . TEE WITHOUT CARDIOVERSION N/A 12/15/2016   Procedure: TRANSESOPHAGEAL ECHOCARDIOGRAM (TEE);  Surgeon: Sherren Mocha, MD;  Location: [facility];  Service: Open Heart Surgery;  Laterality: N/A;  . TRANSCATHETER AORTIC VALVE REPLACEMENT, TRANSFEMORAL N/A 12/15/2016   Procedure: TRANSCATHETER AORTIC VALVE REPLACEMENT, TRANSFEMORAL;  Surgeon: Sherren Mocha, MD;  Location: [facility];  Service: Open Heart Surgery;  Laterality: N/A;     Home Medications:  Prior to Admission medications   Medication Sig Start Date End Date Taking? Authorizing Provider  amLODipine (NORVASC) 10 MG tablet TAKE 1 TABLET BY MOUTH EVERY DAY 07/27/17  Yes [name], MD  amoxicillin (AMOXIL) 500 MG tablet Take 1 tablet (500 mg total) by mouth as directed. 4 TABS 1 HOUR BEFORE DENTAL WORK 01/07/17  Yes Eileen Stanford, PA-C  aspirin (ASPIRIN EC) 81 MG EC tablet Take 81 mg by mouth daily. Swallow whole.   Yes [provider]  carvedilol (COREG) 6.25 MG tablet TAKE 1 TABLET BY MOUTH TWICE A DAY 05/31/17  Yes [name], MD  clopidogrel (PLAVIX) 75 MG tablet TAKE 1 TABLET (75 MG TOTAL) BY MOUTH DAILY WITH BREAKFAST. 09/06/17  Yes Eileen Stanford, PA-C  furosemide (LASIX) 40 MG tablet Take 1 tablet (40 mg total) by mouth daily. 05/20/17  Yes [name], MD  hydrALAZINE (APRESOLINE) 25 MG tablet TAKE 1 TABLET BY MOUTH EVERY 8 HOURS 09/20/17  Yes [name], MD  Multiple Vitamin (MULTIVITAMIN WITH MINERALS) TABS tablet Take 1 tablet by mouth daily. 10/27/15  Yes Bonnell Public, MD  olmesartan (BENICAR) 40 MG tablet Take 1 tablet (40 mg total) by mouth daily. 05/20/17  Yes [name], MD  omega-3 acid ethyl esters (LOVAZA) 1 g capsule TAKE ONE CAPSULE BY MOUTH EVERY DAY 09/20/17  Yes [name], MD  VITAMIN B COMPLEX-C PO Take 1 tablet by mouth daily.   Yes [provider]    Inpatient Medications: Scheduled Meds: . [START ON 12/11/2017] amLODipine  10 mg Oral Daily  . carvedilol  6.25 mg Oral BID  . heparin  5,000 Units Subcutaneous  Q8H  . hydrALAZINE  25 mg Oral Q8H  . sodium chloride flush  3 mL Intravenous Q12H   Continuous Infusions: . sodium chloride     PRN Meds: sodium chloride, acetaminophen **OR** acetaminophen, ondansetron **OR** ondansetron (ZOFRAN) IV, polyethylene glycol, sodium chloride flush  Allergies:   No Known Allergies  Social History:   Social History   Socioeconomic History  . Marital status: Single    Spouse name: Not on file  . Number of children: Not on file  . Years of education: Not on file  . Highest education level: Not on file  Occupational History  . Not on file  Social Needs  . Financial resource strain: Not on file  . Food insecurity:    Worry: Not on file    Inability: Not on file  . Transportation needs:    Medical: Not on file    Non-medical: Not on file  Tobacco Use  . Smoking status: Former Smoker    Types: Cigarettes  . Smokeless tobacco: Never Used  Substance and Sexual Activity  . Alcohol use: Yes    Comment: rarely  . Drug use: No  . Sexual activity: Not on file  Lifestyle  . Physical activity:    Days per week: Not on file    Minutes per session: Not on file  . Stress: Not on file  Relationships  . Social connections:    Talks on phone: Not on file    Gets together: Not on file    Attends religious service: Not on file    Active member of club or organization: Not on file    Attends meetings of clubs or organizations: Not on file    Relationship status: Not on file  . Intimate partner violence:    Fear of current or ex partner: Not on file    Emotionally abused: Not on file    Physically abused: Not on file    Forced sexual activity: Not on file  Other Topics Concern  . Not on file  Social History Narrative  . Not on file    Family History:    Family History  Problem Relation Age of Onset  . Diabetes Mother   . Heart disease Mother   . Pulmonary embolism Father      ROS:  Please see the history of present illness.  Review of  Systems  Constitutional: Positive for malaise/fatigue and weight loss.  HENT: Negative for ear pain and hearing loss.   Eyes: Negative for blurred vision and pain.  Respiratory: Negative for shortness of breath.   Cardiovascular: Negative for chest pain, palpitations, orthopnea, leg swelling and PND.  Gastrointestinal: Negative for blood in stool and melena.  Genitourinary: Negative for dysuria and hematuria.  Musculoskeletal: Negative for falls and myalgias.  Skin: Negative for rash.  Neurological: Negative for focal weakness and loss of consciousness.  Endo/Heme/Allergies: Does not bruise/bleed easily.  All other ROS reviewed and negative.     Physical Exam/Data:   Vitals:   12/10/17 0941 12/10/17 1316  BP: 119/76 (!) 154/140  Pulse: (!) 52   Temp: 98.2 F (36.8 C)   TempSrc: Oral   SpO2: 100%   Weight: 93.3 kg   Height: 6' (1.829 m)    No intake or output data in the 24 hours ending 12/10/17 1404 Filed Weights   12/10/17 0941  Weight: 93.3 kg   Body mass index is 27.88 kg/m.  General:  Well nourished, well developed, in no acute distress HEENT: normal Lymph: no adenopathy Neck: no JVD Endocrine:  No thryomegaly Vascular: No carotid bruits; FA pulses 2+ bilaterally without bruits  Cardiac:  normal S1, S2; RRR; no murmur Lungs:  clear to auscultation bilaterally, no wheezing, rhonchi or rales  Abd: soft, nontender, no hepatomegaly  Ext: no edema Musculoskeletal:  No deformities, BUE and BLE strength normal and equal Skin: warm and dry  Neuro: no focal abnormalities noted Psych:  Normal affect   EKG:  The EKG has not yet been performed Telemetry:  Telemetry was personally reviewed and demonstrates:  Sinus bradycardia, 1st degree AV block, occasional PVC/PAC  Relevant CV Studies:  Echo 12/10/17 Study Conclusions  - Left ventricle: The cavity size was normal. Wall thickness was   normal. Systolic function was moderately reduced. The estimated   ejection  fraction was in the range of 35% to 40%. Diffuse   hypokinesis. Doppler parameters are consistent with abnormal left   ventricular relaxation (grade 1 diastolic dysfunction). No   evidence of thrombus. - Aortic valve: A TAVR stent-valve bioprosthesis was present and   functioning normally. Valve area (VTI): 1.31 cm^2. Valve area   (Vmax): 1.31 cm^2. Valve area (Vmean): 1.34 cm^2. - Left atrium: The atrium was moderately dilated. - Atrial septum: No defect or patent foramen ovale was identified.  Impressions:  - LV EF is lower than reported on the last study from October 2018,   but direct image comarison shows similar findings. TAVR gradients   are higher than in October 2018, but similar to those reported in   September 2018.  Echocardiogram 01/07/2017 Study Conclusions - Left ventricle: The cavity size was mildly dilated. Systolic   function was mildly reduced. The estimated ejection fraction was   in the range of 45% to 50%. Wall motion was normal; there were no   regional wall motion abnormalities. Features are consistent with   a pseudonormal left ventricular filling pattern, with concomitant   abnormal relaxation and increased filling pressure (grade 2   diastolic dysfunction). - Aortic valve: A stent-valve (TAVR) bioprosthesis was present and   functioning normally. Valve area (VTI): 1.41 cm^2. - Left atrium: The atrium was moderately to severely dilated.  Impressions: - Slight increase in TAVR gradients noted.  Echo 12/16/16: EF 50-55%, Compared to intraop TEE, the mean AVG has increased from 53mmHg to 27mmHg  Echo 10/08/16: (Pre-TAVR) EF 25-30%, severe AS, mean gradient 32 mmHg  trace AI; mildly dilated ascending aorta; severe LAE; mild RAE/RVE; mildly elevated pulmonary pressure.   Laboratory Data:  Chemistry Recent Labs  Lab 12/10/17 1220  CREATININE 1.47*  GFRNONAA 43*  GFRAA 49*    No results for input(s): PROT, ALBUMIN, AST, ALT, ALKPHOS, BILITOT in the  last 168 hours. Hematology Recent Labs  Lab 12/10/17 1220  WBC 4.5  RBC 3.72*  HGB 10.1*  HCT 32.6*  MCV 87.6  MCH 27.2  MCHC 31.0  RDW 14.2  PLT 143*   Cardiac EnzymesNo results for input(s): TROPONINI in the last 168 hours. No results for input(s): TROPIPOC in the last 168 hours.  BNPNo results for input(s): BNP, PROBNP in the last 168 hours.  DDimer No results for input(s): DDIMER in the last 168 hours.  Radiology/Studies:  No results found.  Assessment and Plan:   Chronic combined systolic and diastolic heart failure  Status post TAVR 12/15/2016 -TAVR functioning normally. There is some discrepancy in EF quantitatively, but inspection demonstrates that they are visually similar to each other.  Essential hypertension -Noted to have excellent blood pressure control on recent office notes -On amlodipine 10 mg daily, carvedilol 6.25 mg bid, lasix 40 mg daily, hydralazine 25 mg TID, olmesartan 40 mg daily -BP was good, there is an abnormal reading of 154/140 in chart- likely not accurate. Continue to monitor  Hyperlipidemia -Dr. [name] noted to have elevated LDL in April Dr. [name] referred him to his PCP for recommended statin therapy.  -Now on Lovaza  Pre-operative cardiac evaluation -Plan for colon resection. He had normal coronary arteries on cath in 2017. His TAVR valve has been stable. He is acceptable risk for the procedure. Able to do greater than 4 METs of activity, no stress testing required.  Continue to hold clopidogrel pending surgery. Please resume when acceptable from surgical standpoint postoperatively. Would continue aspirin perioperatively if possible given TAVR.   We will sign off but are available if needed, please call.   For questions or updates, please contact Leslie Please consult [email or web address] for contact info under   Signed, Buford Dresser, MD, PhD [facility]  [address] [place], [place]  [address] [phone]

## 2017-12-11 DIAGNOSIS — K639 Disease of intestine, unspecified: Secondary | ICD-10-CM

## 2017-12-11 LAB — CBC
HCT: 29.6 % — ABNORMAL LOW (ref 39.0–52.0)
Hemoglobin: 9 g/dL — ABNORMAL LOW (ref 13.0–17.0)
MCH: 26.8 pg (ref 26.0–34.0)
MCHC: 30.4 g/dL (ref 30.0–36.0)
MCV: 88.1 fL (ref 78.0–100.0)
PLATELETS: UNDETERMINED 10*3/uL (ref 150–400)
RBC: 3.36 MIL/uL — AB (ref 4.22–5.81)
RDW: 13.9 % (ref 11.5–15.5)
WBC: 4.4 10*3/uL (ref 4.0–10.5)

## 2017-12-11 LAB — COMPREHENSIVE METABOLIC PANEL
ALT: 11 U/L (ref 0–44)
AST: 19 U/L (ref 15–41)
Albumin: 3.1 g/dL — ABNORMAL LOW (ref 3.5–5.0)
Alkaline Phosphatase: 49 U/L (ref 38–126)
Anion gap: 8 (ref 5–15)
BUN: 10 mg/dL (ref 8–23)
CO2: 24 mmol/L (ref 22–32)
Calcium: 8.7 mg/dL — ABNORMAL LOW (ref 8.9–10.3)
Chloride: 107 mmol/L (ref 98–111)
Creatinine, Ser: 1.37 mg/dL — ABNORMAL HIGH (ref 0.61–1.24)
GFR calc Af Amer: 54 mL/min — ABNORMAL LOW (ref >60)
GFR, EST NON AFRICAN AMERICAN: 46 mL/min — AB (ref >60)
GLUCOSE: 99 mg/dL (ref 70–99)
POTASSIUM: 3.8 mmol/L (ref 3.5–5.1)
SODIUM: 139 mmol/L (ref 135–145)
TOTAL PROTEIN: 6.4 g/dL — AB (ref 6.5–8.1)
Total Bilirubin: 0.7 mg/dL (ref 0.3–1.2)

## 2017-12-11 LAB — CEA: CEA: 3.1 ng/mL (ref 0.0–4.7)

## 2017-12-11 MED ORDER — ASPIRIN EC 81 MG PO TBEC
81.0000 mg | DELAYED_RELEASE_TABLET | Freq: Every day | ORAL | Status: DC
  Administered 2017-12-11 – 2017-12-18 (×7): 81 mg via ORAL
  Filled 2017-12-11 (×7): qty 1

## 2017-12-11 MED ORDER — CARVEDILOL 3.125 MG PO TABS
3.1250 mg | ORAL_TABLET | Freq: Two times a day (BID) | ORAL | Status: DC
  Administered 2017-12-11: 3.125 mg via ORAL
  Filled 2017-12-11: qty 1

## 2017-12-11 MED ORDER — ENSURE ENLIVE PO LIQD
237.0000 mL | Freq: Three times a day (TID) | ORAL | Status: DC
  Administered 2017-12-11 – 2017-12-17 (×10): 237 mL via ORAL

## 2017-12-11 NOTE — Progress Notes (Signed)
PROGRESS NOTE    Lelend Heinecke  EGB:[id number] DOB: 1934-02-13 DOA: 12/10/2017 PCP: [name], MD    Brief Narrative: Robert Horn is a 84 y.o. male with medical history significant of hypertension, dyslipidemia status post TAVRS in 12/15/2016 by Dr. [name], systolic and diastolic heart failure with an EF of 45% hypertension that was sent here from the gastroenterologist office Dr. Michail Sermon as he did a colonoscopy which showed a colonic obstructive mass.  The patient denies any pain weight loss shortness of breath or fevers.   Assessment & Plan:   Principal Problem:   Colonic mass Active Problems:   Essential hypertension   Chronic combined systolic and diastolic heart failure (HCC)   Severe aortic stenosis   S/P TAVR (transcatheter aortic valve replacement)  Colonic mass, prelinary pathology report adenocarcinoma.  -CT abdomen pevis , chest; bilateral pulmonary nodule stable since 2018/ No obstructive colonic mass identified. There is underdistention and possible wall thickening in the distal transverse colon, which could represent the primary. An adjacent 8 mm node in the transverse mesocolon is indeterminate. Otherwise, no evidence of metastatic disease in the chest, abdomen, or pelvis. -surgery consulted.  -colonoscopy with near obstruction mass.  -clear diet. Sx next week.  -clear by cardiology  -discussed with Dr Grandville Silos, ok to resume baby aspirin.   HTN; PRN hydralazine.  Carvedilol, holder parameter for bradycardia.   Bradycardia;  Holder parameter for carvedilol. Reduce dose.   Chronic combine systolic, diastolic HF; compensated.   S/P TAVR (transcatheter aortic valve replacement) - Hold aspirin and Plavix for possible surgical intervention.      DVT prophylaxis: heparin  Code Status:full code.  Family Communication: care discussed with patient  Disposition Plan: to be determine  Consultants:   Cardiology  sx  Procedures:  ECHO.     Antimicrobials: (  none   Subjective: He is feeling well, tolerating clear diet   Objective: Vitals:   12/10/17 1316 12/10/17 2053 12/11/17 0557 12/11/17 0600  BP: (!) 154/140 (!) 117/93 (!) 143/75 (!) 143/75  Pulse:  (!) 58  (!) 46  Resp:  18  16  Temp:  98 F (36.7 C)  98.3 F (36.8 C)  TempSrc:  Oral  Oral  SpO2:  98%  99%  Weight:    91.4 kg  Height:        Intake/Output Summary (Last 24 hours) at 12/11/2017 0759 Last data filed at 12/11/2017 0600 Gross per 24 hour  Intake 60 ml  Output 350 ml  Net -290 ml   Filed Weights   12/10/17 0941 12/11/17 0600  Weight: 93.3 kg 91.4 kg    Examination:  General exam: Appears calm and comfortable  Respiratory system: Clear to auscultation. Respiratory effort normal. Cardiovascular system: S1 & S2 heard, RRR. No JVD, murmurs, rubs, gallops or clicks. No pedal edema. Gastrointestinal system: Abdomen is nondistended, soft and nontender. No organomegaly or masses felt. Normal bowel sounds heard. Central nervous system: Alert and oriented. No focal neurological deficits. Extremities: Symmetric 5 x 5 power. Skin: No rashes, lesions or ulcers    Data Reviewed: I have personally reviewed following labs and imaging studies  CBC: Recent Labs  Lab 12/10/17 1220  WBC 4.5  HGB 10.1*  HCT 32.6*  MCV 87.6  PLT 371*   Basic Metabolic Panel: Recent Labs  Lab 12/10/17 1220  CREATININE 1.47*   GFR: Estimated Creatinine Clearance: 42.5 mL/min (A) (by C-G formula based on SCr of 1.47 mg/dL (H)). Liver Function Tests: No results for input(s):  AST, ALT, ALKPHOS, BILITOT, PROT, ALBUMIN in the last 168 hours. No results for input(s): LIPASE, AMYLASE in the last 168 hours. No results for input(s): AMMONIA in the last 168 hours. Coagulation Profile: No results for input(s): INR, PROTIME in the last 168 hours. Cardiac Enzymes: No results for input(s): CKTOTAL, CKMB, CKMBINDEX, TROPONINI in the last 168 hours. BNP (last 3  results) Recent Labs    01/07/17 1451  PROBNP 474   HbA1C: No results for input(s): HGBA1C in the last 72 hours. CBG: No results for input(s): GLUCAP in the last 168 hours. Lipid Profile: No results for input(s): CHOL, HDL, LDLCALC, TRIG, CHOLHDL, LDLDIRECT in the last 72 hours. Thyroid Function Tests: No results for input(s): TSH, T4TOTAL, FREET4, T3FREE, THYROIDAB in the last 72 hours. Anemia Panel: No results for input(s): VITAMINB12, FOLATE, FERRITIN, TIBC, IRON, RETICCTPCT in the last 72 hours. Sepsis Labs: No results for input(s): PROCALCITON, LATICACIDVEN in the last 168 hours.  No results found for this or any previous visit (from the past 240 hour(s)).       Radiology Studies: Ct Abdomen Pelvis W Wo Contrast  Result Date: 12/10/2017 CLINICAL DATA:  New diagnosis of colon cancer on colonoscopy. EXAM: CT CHEST WITH CONTRAST CT ABDOMEN AND PELVIS WITH AND WITHOUT CONTRAST TECHNIQUE: Multidetector CT imaging of the chest was performed during intravenous contrast administration. Multidetector CT imaging of the abdomen and pelvis was performed following the standard protocol before and during bolus administration of intravenous contrast. CONTRAST:  177mL OMNIPAQUE IOHEXOL 300 MG/ML  SOLN COMPARISON:  Chest radiograph 01/07/2017. CTAs 11/13/2016. No prior abdominopelvic CT. FINDINGS: CT CHEST FINDINGS Cardiovascular: Aortic and branch vessel atherosclerosis. Status post aortic valve repair. Normal heart size, without pericardial effusion. No central pulmonary embolism, on this non-dedicated study. Mediastinum/Nodes: No supraclavicular adenopathy. No mediastinal or hilar adenopathy. Lungs/Pleura: No pleural fluid.  Mild centrilobular emphysema. 3 mm right upper lobe pulmonary nodule on image 53/10 is unchanged compared to the prior, favoring a benign etiology. Right upper lobe scarring. A 4 mm left apical nodule is also unchanged. Musculoskeletal: No acute osseous abnormality. Remote  left clavicular fracture. CT ABDOMEN AND PELVIS FINDINGS Hepatobiliary: Too small to characterize 3 mm lateral segment left liver lobe lesion. No suspicious liver lesion. Normal gallbladder, without biliary ductal dilatation. Pancreas: Mild pancreatic atrophy is likely within normal variation for age. Spleen: Normal in size, without focal abnormality. Adrenals/Urinary Tract: Normal adrenal glands. Mild renal cortical thinning bilaterally. Favor right renal vascular calcification. Bilateral renal cysts. Normal urinary bladder. Stomach/Bowel: Normal stomach, without wall thickening. Scattered colonic diverticula. No colonic obstruction identified. There is underdistention of and suggestion of wall thickening within the distal transverse colon, including on image 65/8. Normal terminal ileum and appendix. Normal small bowel. Vascular/Lymphatic: Aortic and branch vessel atherosclerosis. Multiple left renal arteries. No abdominal adenopathy. A small node within the left-sided transverse mesocolon at 8 mm on image 57/8. No pelvic sidewall adenopathy. Reproductive: Moderate prostatomegaly with median lobe impression into the urinary bladder. Other: No significant free fluid. No evidence of omental or peritoneal disease. Musculoskeletal: Degenerative partial fusion of the bilateral sacroiliac joints. Moderate lumbosacral spondylosis. IMPRESSION: 1. No obstructive colonic mass identified. There is underdistention and possible wall thickening in the distal transverse colon, which could represent the primary. An adjacent 8 mm node in the transverse mesocolon is indeterminate. 2. Otherwise, no evidence of metastatic disease in the chest, abdomen, or pelvis. 3. Bilateral pulmonary nodules, similar to 11/13/2016. Stability favors a benign etiology. 4. Aortic atherosclerosis (ICD10-I70.0), coronary  artery atherosclerosis and emphysema (ICD10-J43.9). Electronically Signed   By: Abigail Miyamoto M.D.   On: 12/10/2017 22:04   Ct Chest W  Contrast  Result Date: 12/10/2017 CLINICAL DATA:  New diagnosis of colon cancer on colonoscopy. EXAM: CT CHEST WITH CONTRAST CT ABDOMEN AND PELVIS WITH AND WITHOUT CONTRAST TECHNIQUE: Multidetector CT imaging of the chest was performed during intravenous contrast administration. Multidetector CT imaging of the abdomen and pelvis was performed following the standard protocol before and during bolus administration of intravenous contrast. CONTRAST:  143mL OMNIPAQUE IOHEXOL 300 MG/ML  SOLN COMPARISON:  Chest radiograph 01/07/2017. CTAs 11/13/2016. No prior abdominopelvic CT. FINDINGS: CT CHEST FINDINGS Cardiovascular: Aortic and branch vessel atherosclerosis. Status post aortic valve repair. Normal heart size, without pericardial effusion. No central pulmonary embolism, on this non-dedicated study. Mediastinum/Nodes: No supraclavicular adenopathy. No mediastinal or hilar adenopathy. Lungs/Pleura: No pleural fluid.  Mild centrilobular emphysema. 3 mm right upper lobe pulmonary nodule on image 53/10 is unchanged compared to the prior, favoring a benign etiology. Right upper lobe scarring. A 4 mm left apical nodule is also unchanged. Musculoskeletal: No acute osseous abnormality. Remote left clavicular fracture. CT ABDOMEN AND PELVIS FINDINGS Hepatobiliary: Too small to characterize 3 mm lateral segment left liver lobe lesion. No suspicious liver lesion. Normal gallbladder, without biliary ductal dilatation. Pancreas: Mild pancreatic atrophy is likely within normal variation for age. Spleen: Normal in size, without focal abnormality. Adrenals/Urinary Tract: Normal adrenal glands. Mild renal cortical thinning bilaterally. Favor right renal vascular calcification. Bilateral renal cysts. Normal urinary bladder. Stomach/Bowel: Normal stomach, without wall thickening. Scattered colonic diverticula. No colonic obstruction identified. There is underdistention of and suggestion of wall thickening within the distal transverse  colon, including on image 65/8. Normal terminal ileum and appendix. Normal small bowel. Vascular/Lymphatic: Aortic and branch vessel atherosclerosis. Multiple left renal arteries. No abdominal adenopathy. A small node within the left-sided transverse mesocolon at 8 mm on image 57/8. No pelvic sidewall adenopathy. Reproductive: Moderate prostatomegaly with median lobe impression into the urinary bladder. Other: No significant free fluid. No evidence of omental or peritoneal disease. Musculoskeletal: Degenerative partial fusion of the bilateral sacroiliac joints. Moderate lumbosacral spondylosis. IMPRESSION: 1. No obstructive colonic mass identified. There is underdistention and possible wall thickening in the distal transverse colon, which could represent the primary. An adjacent 8 mm node in the transverse mesocolon is indeterminate. 2. Otherwise, no evidence of metastatic disease in the chest, abdomen, or pelvis. 3. Bilateral pulmonary nodules, similar to 11/13/2016. Stability favors a benign etiology. 4. Aortic atherosclerosis (ICD10-I70.0), coronary artery atherosclerosis and emphysema (ICD10-J43.9). Electronically Signed   By: Abigail Miyamoto M.D.   On: 12/10/2017 22:04        Scheduled Meds: . amLODipine  10 mg Oral Daily  . carvedilol  6.25 mg Oral BID  . heparin  5,000 Units Subcutaneous Q8H  . hydrALAZINE  25 mg Oral Q8H  . sodium chloride flush  3 mL Intravenous Q12H   Continuous Infusions: . sodium chloride       LOS: 1 day    Time spent: 35 minutes.     Elmarie Shiley, MD [facility] Pager [phone]  If 7PM-7AM, please contact night-coverage [email or web address] Password [facility] 12/11/2017, 7:59 AM

## 2017-12-11 NOTE — Progress Notes (Signed)
Brief cardiology progress note: Per patient, plan is for possible surgery on 9/18. If possible, would keep aspirin on throughout surgery, though will defer to surgical team regarding risk of bleeding. I have not ordered in case it is prohibitive risk per surgery. Ok to continue to hold clopidogrel. Please contact us with any questions.  Buford Dresser, MD, PhD [facility]  [address] [place], [place] [address] [phone]

## 2017-12-12 LAB — BASIC METABOLIC PANEL
Anion gap: 10 (ref 5–15)
BUN: 10 mg/dL (ref 8–23)
CHLORIDE: 106 mmol/L (ref 98–111)
CO2: 25 mmol/L (ref 22–32)
CREATININE: 1.27 mg/dL — AB (ref 0.61–1.24)
Calcium: 9.2 mg/dL (ref 8.9–10.3)
GFR calc non Af Amer: 51 mL/min — ABNORMAL LOW (ref >60)
GFR, EST AFRICAN AMERICAN: 59 mL/min — AB (ref >60)
Glucose, Bld: 107 mg/dL — ABNORMAL HIGH (ref 70–99)
POTASSIUM: 3.8 mmol/L (ref 3.5–5.1)
Sodium: 141 mmol/L (ref 135–145)

## 2017-12-12 LAB — MAGNESIUM: MAGNESIUM: 2.3 mg/dL (ref 1.7–2.4)

## 2017-12-12 LAB — SURGICAL PCR SCREEN
MRSA, PCR: NEGATIVE
Staphylococcus aureus: NEGATIVE

## 2017-12-12 MED ORDER — CARVEDILOL 6.25 MG PO TABS
6.2500 mg | ORAL_TABLET | Freq: Two times a day (BID) | ORAL | Status: DC
  Administered 2017-12-12 – 2017-12-13 (×3): 6.25 mg via ORAL
  Filled 2017-12-12 (×5): qty 1

## 2017-12-12 MED ORDER — MUPIROCIN 2 % EX OINT
1.0000 "application " | TOPICAL_OINTMENT | Freq: Two times a day (BID) | CUTANEOUS | Status: DC
  Filled 2017-12-12: qty 22

## 2017-12-12 MED ORDER — POTASSIUM CHLORIDE CRYS ER 20 MEQ PO TBCR
40.0000 meq | EXTENDED_RELEASE_TABLET | Freq: Once | ORAL | Status: AC
  Administered 2017-12-12: 40 meq via ORAL
  Filled 2017-12-12: qty 2

## 2017-12-12 NOTE — Progress Notes (Signed)
Patient had a 32 beat run of V Tach and was asymptomatic upon assessment. MD on call was notified. Orders received and awaiting labs. Will continue to monitor closely.

## 2017-12-12 NOTE — Progress Notes (Signed)
Pt had a 11 beat run of V Tach. Pt was asymptomatic and currently sleeping. MD on call was notified. Will continue to monitor.

## 2017-12-12 NOTE — Progress Notes (Signed)
Patient ID: Robert Horn, male   DOB: June 11, 1933, 84 y.o.   MRN: [id number]   Dr. Brantley Stage is our acute care surgeon starting tomorrow.  He will review the films and path and discuss surgical options with the patient tomorrow

## 2017-12-12 NOTE — Progress Notes (Signed)
PROGRESS NOTE    Robert Horn  VOZ:[id number] DOB: [birth year] DOA: 12/10/2017 PCP: [name], MD    Brief Narrative: Robert Horn is a 84 y.o. male with medical history significant of hypertension, dyslipidemia status post TAVRS in 12/15/2016 by Dr. [name], systolic and diastolic heart failure with an EF of 45% hypertension that was sent here from the gastroenterologist office Dr. Michail Sermon as he did a colonoscopy which showed a colonic obstructive mass.  The patient denies any pain weight loss shortness of breath or fevers.   Assessment & Plan:   Principal Problem:   Colonic mass Active Problems:   Essential hypertension   Chronic combined systolic and diastolic heart failure (HCC)   Severe aortic stenosis   S/P TAVR (transcatheter aortic valve replacement)  Colonic mass, prelinary pathology report adenocarcinoma.  -CT abdomen pevis , chest; bilateral pulmonary nodule stable since 2018/ No obstructive colonic mass identified. There is underdistention and possible wall thickening in the distal transverse colon, which could represent the primary. An adjacent 8 mm node in the transverse mesocolon is indeterminate. Otherwise, no evidence of metastatic disease in the chest, abdomen, or pelvis. -surgery consulted.  -colonoscopy with near obstruction mass.  -clear diet. Sx next week.  -clear by cardiology  -Discussed with Dr Grandville Silos, ok to resume baby aspirin.   HTN; PRN hydralazine.  Carvedilol, holder parameter for bradycardia.   Bradycardia;  Holder parameter for carvedilol. Resume home dose, had VT   VT; asymptomatic. Replete k, resume carvedilol.   Chronic combine systolic, diastolic HF; compensated.   S/P TAVR (transcatheter aortic valve replacement) - Hold  Plavix for possible surgical intervention. -ok to resume aspirin per sx.      DVT prophylaxis: heparin  Code Status:full code.  Family Communication: care discussed with patient  Disposition Plan: to be  determine  Consultants:   Cardiology  sx  Procedures:  ECHO.    Antimicrobials: (  none   Subjective: He is feeling well, denies abdominal pain; had watery bm.   Objective: Vitals:   12/11/17 2000 12/12/17 0458 12/12/17 0501 12/12/17 0851  BP: (!) 141/72  (!) 151/90 (!) 139/98  Pulse:   75   Resp:   16   Temp: 98.3 F (36.8 C)  98.6 F (37 C)   TempSrc: Oral  Oral   SpO2: 100%  99%   Weight:  94 kg    Height:       No intake or output data in the 24 hours ending 12/12/17 1115 Filed Weights   12/10/17 0941 12/11/17 0600 12/12/17 0458  Weight: 93.3 kg 91.4 kg 94 kg    Examination:  General exam: NAD Respiratory system: CTA Cardiovascular system: S 1, S 2 RRR Gastrointestinal system: BS present, soft, nt Central nervous system:non focal.  Extremities: Symmetric power.  Skin: No rashes.     Data Reviewed: I have personally reviewed following labs and imaging studies  CBC: Recent Labs  Lab 12/10/17 1220 12/11/17 0726  WBC 4.5 4.4  HGB 10.1* 9.0*  HCT 32.6* 29.6*  MCV 87.6 88.1  PLT 143* PLATELET CLUMPS NOTED ON SMEAR, UNABLE TO ESTIMATE   Basic Metabolic Panel: Recent Labs  Lab 12/10/17 1220 12/11/17 0726 12/12/17 0545  NA  --  139 141  K  --  3.8 3.8  CL  --  107 106  CO2  --  24 25  GLUCOSE  --  99 107*  BUN  --  10 10  CREATININE 1.47* 1.37* 1.27*  CALCIUM  --  8.7* 9.2  MG  --   --  2.3   GFR: Estimated Creatinine Clearance: 53.4 mL/min (A) (by C-G formula based on SCr of 1.27 mg/dL (H)). Liver Function Tests: Recent Labs  Lab 12/11/17 0726  AST 19  ALT 11  ALKPHOS 49  BILITOT 0.7  PROT 6.4*  ALBUMIN 3.1*   No results for input(s): LIPASE, AMYLASE in the last 168 hours. No results for input(s): AMMONIA in the last 168 hours. Coagulation Profile: No results for input(s): INR, PROTIME in the last 168 hours. Cardiac Enzymes: No results for input(s): CKTOTAL, CKMB, CKMBINDEX, TROPONINI in the last 168 hours. BNP (last 3  results) Recent Labs    01/07/17 1451  PROBNP 474   HbA1C: No results for input(s): HGBA1C in the last 72 hours. CBG: No results for input(s): GLUCAP in the last 168 hours. Lipid Profile: No results for input(s): CHOL, HDL, LDLCALC, TRIG, CHOLHDL, LDLDIRECT in the last 72 hours. Thyroid Function Tests: No results for input(s): TSH, T4TOTAL, FREET4, T3FREE, THYROIDAB in the last 72 hours. Anemia Panel: No results for input(s): VITAMINB12, FOLATE, FERRITIN, TIBC, IRON, RETICCTPCT in the last 72 hours. Sepsis Labs: No results for input(s): PROCALCITON, LATICACIDVEN in the last 168 hours.  No results found for this or any previous visit (from the past 240 hour(s)).       Radiology Studies: Ct Abdomen Pelvis W Wo Contrast  Result Date: 12/10/2017 CLINICAL DATA:  New diagnosis of colon cancer on colonoscopy. EXAM: CT CHEST WITH CONTRAST CT ABDOMEN AND PELVIS WITH AND WITHOUT CONTRAST TECHNIQUE: Multidetector CT imaging of the chest was performed during intravenous contrast administration. Multidetector CT imaging of the abdomen and pelvis was performed following the standard protocol before and during bolus administration of intravenous contrast. CONTRAST:  126mL OMNIPAQUE IOHEXOL 300 MG/ML  SOLN COMPARISON:  Chest radiograph 01/07/2017. CTAs 11/13/2016. No prior abdominopelvic CT. FINDINGS: CT CHEST FINDINGS Cardiovascular: Aortic and branch vessel atherosclerosis. Status post aortic valve repair. Normal heart size, without pericardial effusion. No central pulmonary embolism, on this non-dedicated study. Mediastinum/Nodes: No supraclavicular adenopathy. No mediastinal or hilar adenopathy. Lungs/Pleura: No pleural fluid.  Mild centrilobular emphysema. 3 mm right upper lobe pulmonary nodule on image 53/10 is unchanged compared to the prior, favoring a benign etiology. Right upper lobe scarring. A 4 mm left apical nodule is also unchanged. Musculoskeletal: No acute osseous abnormality. Remote  left clavicular fracture. CT ABDOMEN AND PELVIS FINDINGS Hepatobiliary: Too small to characterize 3 mm lateral segment left liver lobe lesion. No suspicious liver lesion. Normal gallbladder, without biliary ductal dilatation. Pancreas: Mild pancreatic atrophy is likely within normal variation for age. Spleen: Normal in size, without focal abnormality. Adrenals/Urinary Tract: Normal adrenal glands. Mild renal cortical thinning bilaterally. Favor right renal vascular calcification. Bilateral renal cysts. Normal urinary bladder. Stomach/Bowel: Normal stomach, without wall thickening. Scattered colonic diverticula. No colonic obstruction identified. There is underdistention of and suggestion of wall thickening within the distal transverse colon, including on image 65/8. Normal terminal ileum and appendix. Normal small bowel. Vascular/Lymphatic: Aortic and branch vessel atherosclerosis. Multiple left renal arteries. No abdominal adenopathy. A small node within the left-sided transverse mesocolon at 8 mm on image 57/8. No pelvic sidewall adenopathy. Reproductive: Moderate prostatomegaly with median lobe impression into the urinary bladder. Other: No significant free fluid. No evidence of omental or peritoneal disease. Musculoskeletal: Degenerative partial fusion of the bilateral sacroiliac joints. Moderate lumbosacral spondylosis. IMPRESSION: 1. No obstructive colonic mass identified. There is underdistention and possible wall thickening in the distal  transverse colon, which could represent the primary. An adjacent 8 mm node in the transverse mesocolon is indeterminate. 2. Otherwise, no evidence of metastatic disease in the chest, abdomen, or pelvis. 3. Bilateral pulmonary nodules, similar to 11/13/2016. Stability favors a benign etiology. 4. Aortic atherosclerosis (ICD10-I70.0), coronary artery atherosclerosis and emphysema (ICD10-J43.9). Electronically Signed   By: Abigail Miyamoto M.D.   On: 12/10/2017 22:04   Ct Chest W  Contrast  Result Date: 12/10/2017 CLINICAL DATA:  New diagnosis of colon cancer on colonoscopy. EXAM: CT CHEST WITH CONTRAST CT ABDOMEN AND PELVIS WITH AND WITHOUT CONTRAST TECHNIQUE: Multidetector CT imaging of the chest was performed during intravenous contrast administration. Multidetector CT imaging of the abdomen and pelvis was performed following the standard protocol before and during bolus administration of intravenous contrast. CONTRAST:  189mL OMNIPAQUE IOHEXOL 300 MG/ML  SOLN COMPARISON:  Chest radiograph 01/07/2017. CTAs 11/13/2016. No prior abdominopelvic CT. FINDINGS: CT CHEST FINDINGS Cardiovascular: Aortic and branch vessel atherosclerosis. Status post aortic valve repair. Normal heart size, without pericardial effusion. No central pulmonary embolism, on this non-dedicated study. Mediastinum/Nodes: No supraclavicular adenopathy. No mediastinal or hilar adenopathy. Lungs/Pleura: No pleural fluid.  Mild centrilobular emphysema. 3 mm right upper lobe pulmonary nodule on image 53/10 is unchanged compared to the prior, favoring a benign etiology. Right upper lobe scarring. A 4 mm left apical nodule is also unchanged. Musculoskeletal: No acute osseous abnormality. Remote left clavicular fracture. CT ABDOMEN AND PELVIS FINDINGS Hepatobiliary: Too small to characterize 3 mm lateral segment left liver lobe lesion. No suspicious liver lesion. Normal gallbladder, without biliary ductal dilatation. Pancreas: Mild pancreatic atrophy is likely within normal variation for age. Spleen: Normal in size, without focal abnormality. Adrenals/Urinary Tract: Normal adrenal glands. Mild renal cortical thinning bilaterally. Favor right renal vascular calcification. Bilateral renal cysts. Normal urinary bladder. Stomach/Bowel: Normal stomach, without wall thickening. Scattered colonic diverticula. No colonic obstruction identified. There is underdistention of and suggestion of wall thickening within the distal transverse  colon, including on image 65/8. Normal terminal ileum and appendix. Normal small bowel. Vascular/Lymphatic: Aortic and branch vessel atherosclerosis. Multiple left renal arteries. No abdominal adenopathy. A small node within the left-sided transverse mesocolon at 8 mm on image 57/8. No pelvic sidewall adenopathy. Reproductive: Moderate prostatomegaly with median lobe impression into the urinary bladder. Other: No significant free fluid. No evidence of omental or peritoneal disease. Musculoskeletal: Degenerative partial fusion of the bilateral sacroiliac joints. Moderate lumbosacral spondylosis. IMPRESSION: 1. No obstructive colonic mass identified. There is underdistention and possible wall thickening in the distal transverse colon, which could represent the primary. An adjacent 8 mm node in the transverse mesocolon is indeterminate. 2. Otherwise, no evidence of metastatic disease in the chest, abdomen, or pelvis. 3. Bilateral pulmonary nodules, similar to 11/13/2016. Stability favors a benign etiology. 4. Aortic atherosclerosis (ICD10-I70.0), coronary artery atherosclerosis and emphysema (ICD10-J43.9). Electronically Signed   By: Abigail Miyamoto M.D.   On: 12/10/2017 22:04        Scheduled Meds: . amLODipine  10 mg Oral Daily  . aspirin EC  81 mg Oral Daily  . carvedilol  6.25 mg Oral BID  . feeding supplement (ENSURE ENLIVE)  237 mL Oral TID BM  . heparin  5,000 Units Subcutaneous Q8H  . hydrALAZINE  25 mg Oral Q8H  . sodium chloride flush  3 mL Intravenous Q12H   Continuous Infusions: . sodium chloride       LOS: 2 days    Time spent: 35 minutes.     Robert Horn  Desiree Lucy, MD [facility] Pager [phone]  If 7PM-7AM, please contact night-coverage [email or web address] Password [email or web address] 12/12/2017, 11:15 AM

## 2017-12-13 LAB — CBC
HEMATOCRIT: 29.1 % — AB (ref 39.0–52.0)
Hemoglobin: 9.1 g/dL — ABNORMAL LOW (ref 13.0–17.0)
MCH: 27.2 pg (ref 26.0–34.0)
MCHC: 31.3 g/dL (ref 30.0–36.0)
MCV: 87.1 fL (ref 78.0–100.0)
PLATELETS: 134 10*3/uL — AB (ref 150–400)
RBC: 3.34 MIL/uL — ABNORMAL LOW (ref 4.22–5.81)
RDW: 14 % (ref 11.5–15.5)
WBC: 4.3 10*3/uL (ref 4.0–10.5)

## 2017-12-13 LAB — BASIC METABOLIC PANEL
Anion gap: 6 (ref 5–15)
BUN: 8 mg/dL (ref 8–23)
CALCIUM: 9.1 mg/dL (ref 8.9–10.3)
CO2: 26 mmol/L (ref 22–32)
CREATININE: 1.18 mg/dL (ref 0.61–1.24)
Chloride: 109 mmol/L (ref 98–111)
GFR calc Af Amer: >60 mL/min (ref >60)
GFR calc non Af Amer: 56 mL/min — ABNORMAL LOW (ref >60)
GLUCOSE: 111 mg/dL — AB (ref 70–99)
Potassium: 4.2 mmol/L (ref 3.5–5.1)
Sodium: 141 mmol/L (ref 135–145)

## 2017-12-13 MED ORDER — GABAPENTIN 300 MG PO CAPS: 300.0000 mg | ORAL_CAPSULE | ORAL | Status: DC

## 2017-12-13 MED ORDER — CHLORHEXIDINE GLUCONATE CLOTH 2 % EX PADS: 6.0000 | MEDICATED_PAD | Freq: Once | CUTANEOUS | Status: DC

## 2017-12-13 MED ORDER — ACETAMINOPHEN 500 MG PO TABS: 1000.0000 mg | ORAL_TABLET | ORAL | Status: DC

## 2017-12-13 MED ORDER — KETOROLAC TROMETHAMINE 15 MG/ML IJ SOLN: 15.0000 mg | INTRAMUSCULAR | Status: DC

## 2017-12-13 MED ORDER — METRONIDAZOLE 500 MG PO TABS: 1000.0000 mg | ORAL_TABLET | ORAL | Status: DC

## 2017-12-13 MED ORDER — SODIUM CHLORIDE 0.9 % IV SOLN
2.0000 g | INTRAVENOUS | Status: DC
  Filled 2017-12-13: qty 2

## 2017-12-13 MED ORDER — NEOMYCIN SULFATE 500 MG PO TABS
1000.0000 mg | ORAL_TABLET | ORAL | Status: DC
  Filled 2017-12-13 (×2): qty 2

## 2017-12-13 MED ORDER — ALVIMOPAN 12 MG PO CAPS
12.0000 mg | ORAL_CAPSULE | ORAL | Status: DC
  Filled 2017-12-13: qty 1

## 2017-12-13 MED ORDER — NEOMYCIN SULFATE 500 MG PO TABS
1000.0000 mg | ORAL_TABLET | ORAL | Status: DC
  Filled 2017-12-13 (×3): qty 2

## 2017-12-13 MED ORDER — SODIUM CHLORIDE 0.9 % IV SOLN
INTRAVENOUS | Status: DC
  Filled 2017-12-13: qty 6

## 2017-12-13 NOTE — Progress Notes (Signed)
PROGRESS NOTE    Robert Horn  GQQ:[id number] DOB: January 06, 1934 DOA: 12/10/2017 PCP: [name], MD    Brief Narrative: Robert Horn is a 84 y.o. male with medical history significant of hypertension, dyslipidemia status post TAVRS in 12/15/2016 by Dr. [name], systolic and diastolic heart failure with an EF of 45% hypertension that was sent here from the gastroenterologist office Dr. Michail Sermon as he did a colonoscopy which showed a colonic obstructive mass.  The patient denies any pain weight loss shortness of breath or fevers.   Assessment & Plan:   Principal Problem:   Colonic mass Active Problems:   Essential hypertension   Chronic combined systolic and diastolic heart failure (HCC)   Severe aortic stenosis   S/P TAVR (transcatheter aortic valve replacement)  Colonic mass, prelinary pathology report adenocarcinoma.  -CT abdomen pevis , chest; bilateral pulmonary nodule stable since 2018/ No obstructive colonic mass identified. There is underdistention and possible wall thickening in the distal transverse colon, which could represent the primary. An adjacent 8 mm node in the transverse mesocolon is indeterminate. Otherwise, no evidence of metastatic disease in the chest, abdomen, or pelvis. -surgery consulted.  -colonoscopy with near obstruction mass.  -clear diet. Sx next week.  -clear by cardiology  -on baby aspirin.  Plan for sx on Wednesday   HTN; PRN hydralazine.  Carvedilol, holder parameter for bradycardia.   Bradycardia;  Holder parameter for carvedilol. Resume home dose, had VT  Stable.   VT; asymptomatic. Replete k, resume carvedilol.  No further episodes.   Chronic combine systolic, diastolic HF; compensated.   S/P TAVR (transcatheter aortic valve replacement) - Hold  Plavix for possible surgical intervention. -ok to resume aspirin per sx.      DVT prophylaxis: heparin  Code Status:full code.  Family Communication: care discussed with patient    Disposition Plan: to be determine  Consultants:   Cardiology  sx  Procedures:  ECHO.    Antimicrobials: (  none   Subjective: He spoke with surgeon, he understand the procedure.  He denies abdominal pain   Objective: Vitals:   12/12/17 1950 12/13/17 0452 12/13/17 0807 12/13/17 1212  BP: 131/86 (!) 142/84 (!) 156/74 132/81  Pulse: (!) 47 (!) 52  (!) 57  Resp: 17 (!) 24  18  Temp: 98.6 F (37 C) 97.6 F (36.4 C)  98.2 F (36.8 C)  TempSrc: Oral Oral  Oral  SpO2: 100% 97%  100%  Weight:  95 kg    Height:       No intake or output data in the 24 hours ending 12/13/17 1416 Filed Weights   12/11/17 0600 12/12/17 0458 12/13/17 0452  Weight: 91.4 kg 94 kg 95 kg    Examination:  General exam: NAD Respiratory system: CTA Cardiovascular system: S 1, S 2 RRR Gastrointestinal system; BS presents, soft.  Central nervous system: Non focal.  Extremities: Symmetric power.  Skin: No rashes.     Data Reviewed: I have personally reviewed following labs and imaging studies  CBC: Recent Labs  Lab 12/10/17 1220 12/11/17 0726 12/13/17 0630  WBC 4.5 4.4 4.3  HGB 10.1* 9.0* 9.1*  HCT 32.6* 29.6* 29.1*  MCV 87.6 88.1 87.1  PLT 143* PLATELET CLUMPS NOTED ON SMEAR, UNABLE TO ESTIMATE 671*   Basic Metabolic Panel: Recent Labs  Lab 12/10/17 1220 12/11/17 0726 12/12/17 0545 12/13/17 0630  NA  --  139 141 141  K  --  3.8 3.8 4.2  CL  --  107 106 109  CO2  --  24 25 26   GLUCOSE  --  99 107* 111*  BUN  --  10 10 8   CREATININE 1.47* 1.37* 1.27* 1.18  CALCIUM  --  8.7* 9.2 9.1  MG  --   --  2.3  --    GFR: Estimated Creatinine Clearance: 57.8 mL/min (by C-G formula based on SCr of 1.18 mg/dL). Liver Function Tests: Recent Labs  Lab 12/11/17 0726  AST 19  ALT 11  ALKPHOS 49  BILITOT 0.7  PROT 6.4*  ALBUMIN 3.1*   No results for input(s): LIPASE, AMYLASE in the last 168 hours. No results for input(s): AMMONIA in the last 168 hours. Coagulation  Profile: No results for input(s): INR, PROTIME in the last 168 hours. Cardiac Enzymes: No results for input(s): CKTOTAL, CKMB, CKMBINDEX, TROPONINI in the last 168 hours. BNP (last 3 results) Recent Labs    01/07/17 1451  PROBNP 474   HbA1C: No results for input(s): HGBA1C in the last 72 hours. CBG: No results for input(s): GLUCAP in the last 168 hours. Lipid Profile: No results for input(s): CHOL, HDL, LDLCALC, TRIG, CHOLHDL, LDLDIRECT in the last 72 hours. Thyroid Function Tests: No results for input(s): TSH, T4TOTAL, FREET4, T3FREE, THYROIDAB in the last 72 hours. Anemia Panel: No results for input(s): VITAMINB12, FOLATE, FERRITIN, TIBC, IRON, RETICCTPCT in the last 72 hours. Sepsis Labs: No results for input(s): PROCALCITON, LATICACIDVEN in the last 168 hours.  Recent Results (from the past 240 hour(s))  Surgical PCR screen     Status: None   Collection Time: 12/12/17 11:33 AM  Result Value Ref Range Status   MRSA, PCR NEGATIVE NEGATIVE Final   Staphylococcus aureus NEGATIVE NEGATIVE Final    Comment: (NOTE) The Xpert SA Assay (FDA approved for NASAL specimens in patients 23 years of age and older), is one component of a comprehensive surveillance program. It is not intended to diagnose infection nor to guide or monitor treatment. Performed at [facility], [place] [address] [place], [place] [address]          Radiology Studies: No results found.      Scheduled Meds: . amLODipine  10 mg Oral Daily  . aspirin EC  81 mg Oral Daily  . carvedilol  6.25 mg Oral BID  . feeding supplement (ENSURE ENLIVE)  237 mL Oral TID BM  . heparin  5,000 Units Subcutaneous Q8H  . hydrALAZINE  25 mg Oral Q8H  . sodium chloride flush  3 mL Intravenous Q12H   Continuous Infusions: . sodium chloride       LOS: 3 days    Time spent: 35 minutes.     Elmarie Shiley, MD [facility] Pager [phone]  If 7PM-7AM, please contact  night-coverage [email or web address] Password [email or web address] 12/13/2017, 2:16 PM

## 2017-12-13 NOTE — Progress Notes (Signed)
  [facility]   MULTIDISCIPLINARY HEART VALVE TEAM  Chart being reviewed with upcoming 1 year TAVR appointment. He was scheduled for 9/26 for an echocardiogram, CT scan to follow up on incidental pulmonary nodule findings and an appointment. He is now admitted with a near obstructive colonic mass found on colonoscopy. He is scheduled for surgery on Wednesday. They are holding his plavix and continuing aspirin. Patient has completed 6 months of DAPT and no longer requires Plavix. Plavix can be discontinued at this time. He should continue on a baby aspirin 81 mg daily indefinitely.   During this admission echo showed EF 35-40% with normally functioning TAVR valve and CT chest showed stable pulmonary nodules, favoring benign etiology. Since both these were done in the hospital I will cancel them on 9/26. I spoke with wife and we decided to move 1 year follow up out given surgery.   I have rescheduled his appointment for 11/7.  Angelena Form PA-C  [facility]  [phone]

## 2017-12-13 NOTE — Progress Notes (Signed)
   Subjective/Chief Complaint: colon mass Patient seen at bedside.  Sitting in chair in good spirits.  Denies nausea vomiting.  Having loose stools.  Reviewed his CT scan findings, colonoscopy findings, and surgical plan for him.  He denies any abdominal pain and does not relate any symptoms of obstruction prior to colonoscopy last week.   Objective: Vital signs in last 24 hours: Temp:  [97.6 F (36.4 C)-98.6 F (37 C)] 97.6 F (36.4 C) (09/16 0452) Pulse Rate:  [47-56] 52 (09/16 0452) Resp:  [17-24] 24 (09/16 0452) BP: (128-156)/(65-86) 156/74 (09/16 0807) SpO2:  [97 %-100 %] 97 % (09/16 0452) Weight:  [95 kg] 95 kg (09/16 0452) Last BM Date: 12/12/17  Intake/Output from previous day: 09/15 0701 - 09/16 0700 In: 120 [P.O.:120] Out: -  Intake/Output this shift: No intake/output data recorded.  General appearance: alert and cooperative  Lab Results:  Recent Labs    12/11/17 0726 12/13/17 0630  WBC 4.4 4.3  HGB 9.0* 9.1*  HCT 29.6* 29.1*  PLT PLATELET CLUMPS NOTED ON SMEAR, UNABLE TO ESTIMATE 134*   BMET Recent Labs    12/12/17 0545 12/13/17 0630  NA 141 141  K 3.8 4.2  CL 106 109  CO2 25 26  GLUCOSE 107* 111*  BUN 10 8  CREATININE 1.27* 1.18  CALCIUM 9.2 9.1   PT/INR No results for input(s): LABPROT, INR in the last 72 hours. ABG No results for input(s): PHART, HCO3 in the last 72 hours.  Invalid input(s): PCO2, PO2  Studies/Results: No results found.  Anti-infectives: Anti-infectives (From admission, onward)   None      Assessment/Plan: Near obstructing transverse colon mass per colonoscopy report  Chronic anemia  Recommend laparoscopic assisted partial colectomy.  We discussed the pros and cons of this approach.  We discussed possible open surgery on top of this as well as the need for colostomy.  Given the fact that he has no signs of clinical obstruction, we will try gentle bowel prep today with the hope of a one stage operation.  He was  prepped for his colonoscopy without difficulty.  Report states this is near obstructing but clinically is not obstructed and certainly I think a prep would be reasonable in his case to avoid any further surgery down the road.  I discussed all the potential complications, long-term expectations, and other possible treatments necessary depending on final pathology.The procedure was discussed with the patient.  Laparoscopic partial colectomy discussed with the patient as well as non operative treatments. The risks of operative management include bleeding,  Infection,  Leak of anastamosis,  Ostomy formation, open procedure,  Sepsis,  Abcess,  Hernia,  DVT,  Pulmonary complications,  Cardiovascular  complications,  Injury to ureter,  Bladder,kidney,and anesthesia risks,  And death. The patient understands.  Questions answered.   The success of the procedure is 95 % for treating the patients symptoms. He  Agrees  to proceed.   LOS: 3 days    Joyice Faster Daphnie Venturini 12/13/2017

## 2017-12-14 ENCOUNTER — Encounter (HOSPITAL_COMMUNITY): Payer: Self-pay | Admitting: Anesthesiology

## 2017-12-14 DIAGNOSIS — C184 Malignant neoplasm of transverse colon: Secondary | ICD-10-CM | POA: Diagnosis not present

## 2017-12-14 LAB — HEMOGLOBIN A1C
Hgb A1c MFr Bld: 6.4 % — ABNORMAL HIGH (ref 4.8–5.6)
Mean Plasma Glucose: 137 mg/dL

## 2017-12-14 LAB — BASIC METABOLIC PANEL
Anion gap: 9 (ref 5–15)
BUN: 8 mg/dL (ref 8–23)
CHLORIDE: 104 mmol/L (ref 98–111)
CO2: 26 mmol/L (ref 22–32)
CREATININE: 1.21 mg/dL (ref 0.61–1.24)
Calcium: 9.8 mg/dL (ref 8.9–10.3)
GFR calc Af Amer: >60 mL/min (ref >60)
GFR calc non Af Amer: 54 mL/min — ABNORMAL LOW (ref >60)
Glucose, Bld: 107 mg/dL — ABNORMAL HIGH (ref 70–99)
Potassium: 4 mmol/L (ref 3.5–5.1)
Sodium: 139 mmol/L (ref 135–145)

## 2017-12-14 MED ORDER — CHLORHEXIDINE GLUCONATE CLOTH 2 % EX PADS: 6.0000 | MEDICATED_PAD | Freq: Once | CUTANEOUS | Status: DC

## 2017-12-14 MED ORDER — POLYETHYLENE GLYCOL 3350 17 GM/SCOOP PO POWD
1.0000 | Freq: Once | ORAL | Status: AC
  Administered 2017-12-14: 1 via ORAL
  Filled 2017-12-14: qty 255

## 2017-12-14 MED ORDER — CHLORHEXIDINE GLUCONATE CLOTH 2 % EX PADS
6.0000 | MEDICATED_PAD | Freq: Once | CUTANEOUS | Status: AC
  Administered 2017-12-15: 6 via TOPICAL

## 2017-12-14 MED ORDER — NEOMYCIN SULFATE 500 MG PO TABS
1000.0000 mg | ORAL_TABLET | ORAL | Status: AC
  Administered 2017-12-14 (×3): 1000 mg via ORAL
  Filled 2017-12-14 (×3): qty 2

## 2017-12-14 MED ORDER — BISACODYL 5 MG PO TBEC
10.0000 mg | DELAYED_RELEASE_TABLET | Freq: Once | ORAL | Status: AC
  Administered 2017-12-14: 10 mg via ORAL
  Filled 2017-12-14: qty 2

## 2017-12-14 MED ORDER — ALVIMOPAN 12 MG PO CAPS
12.0000 mg | ORAL_CAPSULE | ORAL | Status: DC
  Filled 2017-12-14: qty 1

## 2017-12-14 MED ORDER — CARVEDILOL 3.125 MG PO TABS
3.1250 mg | ORAL_TABLET | Freq: Two times a day (BID) | ORAL | Status: DC
  Administered 2017-12-14 – 2017-12-18 (×8): 3.125 mg via ORAL
  Filled 2017-12-14 (×8): qty 1

## 2017-12-14 MED ORDER — SODIUM CHLORIDE 0.9 % IV SOLN: 2.0000 g | Freq: Two times a day (BID) | INTRAVENOUS | Status: DC

## 2017-12-14 MED ORDER — METRONIDAZOLE 500 MG PO TABS
1000.0000 mg | ORAL_TABLET | ORAL | Status: AC
  Administered 2017-12-14 (×3): 1000 mg via ORAL
  Filled 2017-12-14 (×3): qty 2

## 2017-12-14 MED ORDER — SODIUM CHLORIDE 0.9 % IV SOLN
INTRAVENOUS | Status: DC
  Filled 2017-12-14: qty 6

## 2017-12-14 MED ORDER — SODIUM CHLORIDE 0.9 % IV SOLN
2.0000 g | INTRAVENOUS | Status: DC
  Filled 2017-12-14: qty 2

## 2017-12-14 MED ORDER — CHLORHEXIDINE GLUCONATE CLOTH 2 % EX PADS
6.0000 | MEDICATED_PAD | Freq: Once | CUTANEOUS | Status: AC
  Administered 2017-12-14: 6 via TOPICAL

## 2017-12-14 MED ORDER — GABAPENTIN 300 MG PO CAPS
300.0000 mg | ORAL_CAPSULE | ORAL | Status: AC
  Administered 2017-12-15: 300 mg via ORAL
  Filled 2017-12-14: qty 1

## 2017-12-14 MED ORDER — KETOROLAC TROMETHAMINE 15 MG/ML IJ SOLN
15.0000 mg | INTRAMUSCULAR | Status: AC
  Administered 2017-12-15: 15 mg via INTRAVENOUS
  Filled 2017-12-14: qty 1

## 2017-12-14 MED ORDER — SODIUM CHLORIDE 0.9 % IV SOLN
2.0000 g | INTRAVENOUS | Status: AC
  Administered 2017-12-15: 2 g via INTRAVENOUS
  Filled 2017-12-14 (×2): qty 2

## 2017-12-14 MED ORDER — ACETAMINOPHEN 500 MG PO TABS
1000.0000 mg | ORAL_TABLET | ORAL | Status: AC
  Administered 2017-12-15: 1000 mg via ORAL
  Filled 2017-12-14: qty 2

## 2017-12-14 NOTE — Progress Notes (Signed)
   Subjective/Chief Complaint: WEAKNESS Pt has no complaints Did not receive prep so surgery rescheduled for wed     Objective: Vital signs in last 24 hours: Temp:  [98.2 F (36.8 C)-98.4 F (36.9 C)] 98.4 F (36.9 C) (09/17 0500) Pulse Rate:  [52-57] 52 (09/17 0500) Resp:  [18] 18 (09/17 0500) BP: (128-157)/(73-95) 157/81 (09/17 0727) SpO2:  [99 %-100 %] 99 % (09/17 0500) Weight:  [95.7 kg] 95.7 kg (09/17 0500) Last BM Date: 12/14/17  Intake/Output from previous day: 09/16 0701 - 09/17 0700 In: 960 [P.O.:960] Out: -  Intake/Output this shift: No intake/output data recorded.  General appearance: alert  Lab Results:  Recent Labs    12/13/17 0630  WBC 4.3  HGB 9.1*  HCT 29.1*  PLT 134*   BMET Recent Labs    12/12/17 0545 12/13/17 0630  NA 141 141  K 3.8 4.2  CL 106 109  CO2 25 26  GLUCOSE 107* 111*  BUN 10 8  CREATININE 1.27* 1.18  CALCIUM 9.2 9.1   PT/INR No results for input(s): LABPROT, INR in the last 72 hours. ABG No results for input(s): PHART, HCO3 in the last 72 hours.  Invalid input(s): PCO2, PO2  Studies/Results: No results found.  Anti-infectives: Anti-infectives (From admission, onward)   Start     Dose/Rate Route Frequency Ordered Stop   12/14/17 1400  neomycin (MYCIFRADIN) tablet 1,000 mg     1,000 mg Oral 3 times per day on Tue 12/13/17 1903 12/21/17 1359   12/14/17 1400  metroNIDAZOLE (FLAGYL) tablet 1,000 mg     1,000 mg Oral 3 times per day on Tue 12/13/17 1903 12/21/17 1359   12/14/17 0900  clindamycin (CLEOCIN) 900 mg, gentamicin (GARAMYCIN) 240 mg in sodium chloride 0.9 % 1,000 mL for intraperitoneal lavage      Intraperitoneal To Surgery 12/13/17 1634 12/15/17 0900   12/14/17 0855  cefoTEtan (CEFOTAN) 2 g in sodium chloride 0.9 % 100 mL IVPB     2 g 200 mL/hr over 30 Minutes Intravenous To ShortStay Surgical 12/13/17 1634 12/15/17 0900   12/13/17 1800  neomycin (MYCIFRADIN) tablet 1,000 mg  Status:  Discontinued     1,000  mg Oral 3 times per day on Mon 12/13/17 1634 12/13/17 1905   12/13/17 1800  metroNIDAZOLE (FLAGYL) tablet 1,000 mg  Status:  Discontinued     1,000 mg Oral 3 times per day on Mon 12/13/17 1634 12/13/17 1905      Assessment/Plan: Colon mass Reschedule for wed  Laparoscopic partial colectomy   Prep for today   D/W pt      LOS: 4 days    Marcello Moores A Yvette Loveless 12/14/2017

## 2017-12-14 NOTE — H&P (View-Only) (Signed)
   Subjective/Chief Complaint: WEAKNESS Pt has no complaints Did not receive prep so surgery rescheduled for wed     Objective: Vital signs in last 24 hours: Temp:  [98.2 F (36.8 C)-98.4 F (36.9 C)] 98.4 F (36.9 C) (09/17 0500) Pulse Rate:  [52-57] 52 (09/17 0500) Resp:  [18] 18 (09/17 0500) BP: (128-157)/(73-95) 157/81 (09/17 0727) SpO2:  [99 %-100 %] 99 % (09/17 0500) Weight:  [95.7 kg] 95.7 kg (09/17 0500) Last BM Date: 12/14/17  Intake/Output from previous day: 09/16 0701 - 09/17 0700 In: 960 [P.O.:960] Out: -  Intake/Output this shift: No intake/output data recorded.  General appearance: alert  Lab Results:  Recent Labs    12/13/17 0630  WBC 4.3  HGB 9.1*  HCT 29.1*  PLT 134*   BMET Recent Labs    12/12/17 0545 12/13/17 0630  NA 141 141  K 3.8 4.2  CL 106 109  CO2 25 26  GLUCOSE 107* 111*  BUN 10 8  CREATININE 1.27* 1.18  CALCIUM 9.2 9.1   PT/INR No results for input(s): LABPROT, INR in the last 72 hours. ABG No results for input(s): PHART, HCO3 in the last 72 hours.  Invalid input(s): PCO2, PO2  Studies/Results: No results found.  Anti-infectives: Anti-infectives (From admission, onward)   Start     Dose/Rate Route Frequency Ordered Stop   12/14/17 1400  neomycin (MYCIFRADIN) tablet 1,000 mg     1,000 mg Oral 3 times per day on Tue 12/13/17 1903 12/21/17 1359   12/14/17 1400  metroNIDAZOLE (FLAGYL) tablet 1,000 mg     1,000 mg Oral 3 times per day on Tue 12/13/17 1903 12/21/17 1359   12/14/17 0900  clindamycin (CLEOCIN) 900 mg, gentamicin (GARAMYCIN) 240 mg in sodium chloride 0.9 % 1,000 mL for intraperitoneal lavage      Intraperitoneal To Surgery 12/13/17 1634 12/15/17 0900   12/14/17 0855  cefoTEtan (CEFOTAN) 2 g in sodium chloride 0.9 % 100 mL IVPB     2 g 200 mL/hr over 30 Minutes Intravenous To ShortStay Surgical 12/13/17 1634 12/15/17 0900   12/13/17 1800  neomycin (MYCIFRADIN) tablet 1,000 mg  Status:  Discontinued     1,000  mg Oral 3 times per day on Mon 12/13/17 1634 12/13/17 1905   12/13/17 1800  metroNIDAZOLE (FLAGYL) tablet 1,000 mg  Status:  Discontinued     1,000 mg Oral 3 times per day on Mon 12/13/17 1634 12/13/17 1905      Assessment/Plan: Colon mass Reschedule for wed  Laparoscopic partial colectomy   Prep for today   D/W pt      LOS: 4 days    Marcello Moores A Mabrey Howland 12/14/2017

## 2017-12-14 NOTE — Progress Notes (Addendum)
PROGRESS NOTE    Robert Horn  ZHG:[id number] DOB: 16-Dec-1933 DOA: 12/10/2017 PCP: [name], MD    Brief Narrative: Robert Horn is a 84 y.o. male with medical history significant of hypertension, dyslipidemia status post TAVRS in 12/15/2016 by Dr. [name], systolic and diastolic heart failure with an EF of 45% hypertension that was sent here from the gastroenterologist office Dr. Michail Sermon as he did a colonoscopy which showed a colonic obstructive mass.  The patient denies any pain weight loss shortness of breath or fevers.   Assessment & Plan:   Principal Problem:   Colonic mass Active Problems:   Essential hypertension   Chronic combined systolic and diastolic heart failure (HCC)   Severe aortic stenosis   S/P TAVR (transcatheter aortic valve replacement)  Colonic mass, prelinary pathology report adenocarcinoma.  -CT abdomen pevis , chest; bilateral pulmonary nodule stable since 2018/ No obstructive colonic mass identified. There is underdistention and possible wall thickening in the distal transverse colon, which could represent the primary. An adjacent 8 mm node in the transverse mesocolon is indeterminate. Otherwise, no evidence of metastatic disease in the chest, abdomen, or pelvis. -surgery consulted.  -colonoscopy with near obstruction mass.  -clear diet.  -clear by cardiology  -on baby aspirin.  Plan for sx on Wednesday   HTN; PRN hydralazine.  Carvedilol, holder parameter for bradycardia.   Bradycardia;  Holder parameter for carvedilol. Reduce dose to 3.25 mg BID.  Stable.   VT; asymptomatic. Replete k, resume carvedilol.  No further episodes.   Chronic combine systolic, diastolic HF; compensated.   S/P TAVR (transcatheter aortic valve replacement) - per cardiology ; no need to resume plavix post op.  -ok to resume aspirin per sx.  -    DVT prophylaxis: heparin  Code Status:full code.  Family Communication: care discussed with patient    Disposition Plan: to be determine  Consultants:   Cardiology  sx  Procedures:  ECHO. - LV EF is lower than reported on the last study from October 2018,   but direct image comarison shows similar findings. TAVR gradients   are higher than in October 2018, but similar to those reported in   September 2018.   Antimicrobials:   none   Subjective: He is concern with the miscommunication regarding sx and prep.  He is suppose not to eat colored food, he was told. He keep getting red yellow.  He denies dyspnea or chest pain    Objective: Vitals:   12/13/17 1636 12/13/17 2008 12/14/17 0500 12/14/17 0727  BP: (!) 146/73 (!) 128/95 (!) 155/92 (!) 157/81  Pulse:  (!) 56 (!) 52   Resp:  18 18   Temp:  98.4 F (36.9 C) 98.4 F (36.9 C)   TempSrc:  Oral Oral   SpO2:  100% 99%   Weight:   95.7 kg   Height:        Intake/Output Summary (Last 24 hours) at 12/14/2017 1003 Last data filed at 12/13/2017 2008 Gross per 24 hour  Intake 720 ml  Output -  Net 720 ml   Filed Weights   12/12/17 0458 12/13/17 0452 12/14/17 0500  Weight: 94 kg 95 kg 95.7 kg    Examination:  General exam: NAD Respiratory system: CTA Cardiovascular system: S 1, S 2 RRR Gastrointestinal system; BS present, soft, nt Central nervous system: Non focal.  Extremities: Symmetric power.  Skin:  No rashes.     Data Reviewed: I have personally reviewed following labs and imaging studies  CBC:  Recent Labs  Lab 12/10/17 1220 12/11/17 0726 12/13/17 0630  WBC 4.5 4.4 4.3  HGB 10.1* 9.0* 9.1*  HCT 32.6* 29.6* 29.1*  MCV 87.6 88.1 87.1  PLT 143* PLATELET CLUMPS NOTED ON SMEAR, UNABLE TO ESTIMATE 330*   Basic Metabolic Panel: Recent Labs  Lab 12/10/17 1220 12/11/17 0726 12/12/17 0545 12/13/17 0630  NA  --  139 141 141  K  --  3.8 3.8 4.2  CL  --  107 106 109  CO2  --  24 25 26   GLUCOSE  --  99 107* 111*  BUN  --  10 10 8   CREATININE 1.47* 1.37* 1.27* 1.18  CALCIUM  --  8.7* 9.2 9.1  MG   --   --  2.3  --    GFR: Estimated Creatinine Clearance: 57.9 mL/min (by C-G formula based on SCr of 1.18 mg/dL). Liver Function Tests: Recent Labs  Lab 12/11/17 0726  AST 19  ALT 11  ALKPHOS 49  BILITOT 0.7  PROT 6.4*  ALBUMIN 3.1*   No results for input(s): LIPASE, AMYLASE in the last 168 hours. No results for input(s): AMMONIA in the last 168 hours. Coagulation Profile: No results for input(s): INR, PROTIME in the last 168 hours. Cardiac Enzymes: No results for input(s): CKTOTAL, CKMB, CKMBINDEX, TROPONINI in the last 168 hours. BNP (last 3 results) Recent Labs    01/07/17 1451  PROBNP 474   HbA1C: Recent Labs    12/13/17 1642  HGBA1C 6.4*   CBG: No results for input(s): GLUCAP in the last 168 hours. Lipid Profile: No results for input(s): CHOL, HDL, LDLCALC, TRIG, CHOLHDL, LDLDIRECT in the last 72 hours. Thyroid Function Tests: No results for input(s): TSH, T4TOTAL, FREET4, T3FREE, THYROIDAB in the last 72 hours. Anemia Panel: No results for input(s): VITAMINB12, FOLATE, FERRITIN, TIBC, IRON, RETICCTPCT in the last 72 hours. Sepsis Labs: No results for input(s): PROCALCITON, LATICACIDVEN in the last 168 hours.  Recent Results (from the past 240 hour(s))  Surgical PCR screen     Status: None   Collection Time: 12/12/17 11:33 AM  Result Value Ref Range Status   MRSA, PCR NEGATIVE NEGATIVE Final   Staphylococcus aureus NEGATIVE NEGATIVE Final    Comment: (NOTE) The Xpert SA Assay (FDA approved for NASAL specimens in patients 83 years of age and older), is one component of a comprehensive surveillance program. It is not intended to diagnose infection nor to guide or monitor treatment. Performed at [facility], [place] [address] [place], [place] [address]          Radiology Studies: No results found.      Scheduled Meds: . acetaminophen  1,000 mg Oral On Call to OR  . alvimopan  12 mg Oral To SST[facility]  . [START ON 12/15/2017] alvimopan  12 mg  Oral To SST[facility]  . alvimopan  12 mg Oral On Call to OR  . amLODipine  10 mg Oral Daily  . aspirin EC  81 mg Oral Daily  . carvedilol  6.25 mg Oral BID  . Chlorhexidine Gluconate Cloth  6 each Topical Once   And  . Chlorhexidine Gluconate Cloth  6 each Topical Once  . Chlorhexidine Gluconate Cloth  6 each Topical Once   And  . Chlorhexidine Gluconate Cloth  6 each Topical Once  . clindamycin / gentamicin INTRAPERITONEAL Lavage irrigation   Intraperitoneal To OR  . feeding supplement (ENSURE ENLIVE)  237 mL Oral TID BM  . gabapentin  300 mg  Oral On Call to OR  . heparin  5,000 Units Subcutaneous Q8H  . hydrALAZINE  25 mg Oral Q8H  . ketorolac  15 mg Intravenous On Call to OR  . neomycin  1,000 mg Oral 3 times per day on Tue   And  . metroNIDAZOLE  1,000 mg Oral 3 times per day on Tue  . neomycin  1,000 mg Oral 3 times per day   And  . metroNIDAZOLE  1,000 mg Oral 3 times per day  . polyethylene glycol powder  1 Container Oral Once  . sodium chloride flush  3 mL Intravenous Q12H   Continuous Infusions: . sodium chloride    . [START ON 12/15/2017] cefoTEtan (CEFOTAN) IV       LOS: 4 days    Time spent: 35 minutes.     Elmarie Shiley, MD [facility] Pager [phone]  If 7PM-7AM, please contact night-coverage [email or web address] Password [email or web address] 12/14/2017, 10:03 AM

## 2017-12-14 NOTE — Progress Notes (Signed)
Paged [facility] with questions regarding surgery. Awaiting call back. I will continue to monitor the patient closely.   Saddie Benders RN

## 2017-12-14 NOTE — Progress Notes (Signed)
MD on call for [facility] paged regarding clarification of surgery date. Per chart, patient/family and shift change report-pt will be going for surgery on Wednesday at 10am. Upon verifying orders placed at 1630-orders placed indicate surgery on Tuesday 9/17 and patient is on OR schedule for Tuesday at 9 am. With orders not palced until 1630 the orders for bowel prep etc would not be completed prior to surgery. MD on call informed of the above and states to follow up with MD in the morning regarding rescheduling of surgery. [place] Crane, Lynann Beaver, RN

## 2017-12-14 NOTE — Progress Notes (Signed)
Pt received and completed Ensure Pre Surgery Carbohydrate Drink x2 bottles as ordered. Jessie Foot

## 2017-12-14 NOTE — Anesthesia Preprocedure Evaluation (Addendum)
Anesthesia Evaluation    Reviewed: Allergy & Precautions, Patient's Chart, lab work & pertinent test results  History of Anesthesia Complications Negative for: history of anesthetic complications  Airway Mallampati: II  TM Distance: >3 FB Neck ROM: Full    Dental no notable dental hx.    Pulmonary former smoker,    Pulmonary exam normal breath sounds clear to auscultation       Cardiovascular hypertension, Pt. on home beta blockers +CHF  Normal cardiovascular exam+ Valvular Problems/Murmurs  Rhythm:Regular Rate:Normal  Study Conclusions  - Left ventricle: The cavity size was normal. Wall thickness was   normal. Systolic function was moderately reduced. The estimated   ejection fraction was in the range of 35% to 40%. Diffuse   hypokinesis. Doppler parameters are consistent with abnormal left   ventricular relaxation (grade 1 diastolic dysfunction). No   evidence of thrombus. - Aortic valve: A TAVR stent-valve bioprosthesis was present and   functioning normally. Valve area (VTI): 1.31 cm^2. Valve area   (Vmax): 1.31 cm^2. Valve area (Vmean): 1.34 cm^2. - Left atrium: The atrium was moderately dilated. - Atrial septum: No defect or patent foramen ovale was identified.  Impressions:  - LV EF is lower than reported on the last study from October 2018,   but direct image comarison shows similar findings. TAVR gradients   are higher than in October 2018, but similar to those reported in   September 2018.   Neuro/Psych negative neurological ROS  negative psych ROS   GI/Hepatic negative GI ROS, Neg liver ROS,   Endo/Other  negative endocrine ROS  Renal/GU negative Renal ROS     Musculoskeletal   Abdominal   Peds  Hematology   Anesthesia Other Findings   Reproductive/Obstetrics                            Anesthesia Physical Anesthesia Plan  ASA: III  Anesthesia Plan: General    Post-op Pain Management:    Induction: Intravenous  PONV Risk Score and Plan: 3 and Ondansetron, Dexamethasone and Diphenhydramine  Airway Management Planned: Oral ETT  Additional Equipment:   Intra-op Plan:   Post-operative Plan: Possible Post-op intubation/ventilation  Informed Consent:   Plan Discussed with:   Anesthesia Plan Comments:         Anesthesia Quick Evaluation

## 2017-12-15 ENCOUNTER — Encounter (HOSPITAL_COMMUNITY)
Admission: AD | Disposition: A | Discharge status: Home / Self Care | Payer: Self-pay | Source: Ambulatory Visit | Attending: Internal Medicine

## 2017-12-15 ENCOUNTER — Encounter (HOSPITAL_COMMUNITY): Payer: Self-pay | Admitting: General Practice

## 2017-12-15 ENCOUNTER — Inpatient Hospital Stay (HOSPITAL_COMMUNITY): Payer: Medicare Other | Admitting: Anesthesiology

## 2017-12-15 HISTORY — PX: LAPAROSCOPIC PARTIAL COLECTOMY: SHX5907

## 2017-12-15 LAB — BASIC METABOLIC PANEL
Anion gap: 9 (ref 5–15)
BUN: 9 mg/dL (ref 8–23)
CHLORIDE: 104 mmol/L (ref 98–111)
CO2: 25 mmol/L (ref 22–32)
CREATININE: 1.3 mg/dL — AB (ref 0.61–1.24)
Calcium: 9 mg/dL (ref 8.9–10.3)
GFR calc Af Amer: 57 mL/min — ABNORMAL LOW (ref >60)
GFR calc non Af Amer: 49 mL/min — ABNORMAL LOW (ref >60)
Glucose, Bld: 99 mg/dL (ref 70–99)
Potassium: 3.9 mmol/L (ref 3.5–5.1)
Sodium: 138 mmol/L (ref 135–145)

## 2017-12-15 LAB — CBC
HCT: 30.1 % — ABNORMAL LOW (ref 39.0–52.0)
HEMOGLOBIN: 9.6 g/dL — AB (ref 13.0–17.0)
MCH: 27.5 pg (ref 26.0–34.0)
MCHC: 31.9 g/dL (ref 30.0–36.0)
MCV: 86.2 fL (ref 78.0–100.0)
Platelets: 157 10*3/uL (ref 150–400)
RBC: 3.49 MIL/uL — ABNORMAL LOW (ref 4.22–5.81)
RDW: 14.1 % (ref 11.5–15.5)
WBC: 4.7 10*3/uL (ref 4.0–10.5)

## 2017-12-15 SURGERY — LAPAROSCOPIC PARTIAL COLECTOMY
Anesthesia: General | Site: Abdomen

## 2017-12-15 MED ORDER — BUPIVACAINE-EPINEPHRINE 0.25% -1:200000 IJ SOLN
INTRAMUSCULAR | Status: DC | PRN
  Administered 2017-12-15: 30 mL

## 2017-12-15 MED ORDER — BUPIVACAINE-EPINEPHRINE (PF) 0.25% -1:200000 IJ SOLN
INTRAMUSCULAR | Status: AC
  Filled 2017-12-15: qty 30

## 2017-12-15 MED ORDER — LACTATED RINGERS IV SOLN
INTRAVENOUS | Status: DC | PRN
  Administered 2017-12-15: 08:00:00 via INTRAVENOUS

## 2017-12-15 MED ORDER — ONDANSETRON HCL 4 MG/2ML IJ SOLN
INTRAMUSCULAR | Status: AC
  Filled 2017-12-15: qty 4

## 2017-12-15 MED ORDER — POTASSIUM CHLORIDE IN NACL 20-0.9 MEQ/L-% IV SOLN
INTRAVENOUS | Status: DC
  Administered 2017-12-15 – 2017-12-16 (×2): via INTRAVENOUS
  Administered 2017-12-17: 1 mL via INTRAVENOUS
  Administered 2017-12-17 – 2017-12-18 (×2): via INTRAVENOUS
  Filled 2017-12-15 (×5): qty 1000

## 2017-12-15 MED ORDER — FENTANYL CITRATE (PF) 250 MCG/5ML IJ SOLN
INTRAMUSCULAR | Status: AC
  Filled 2017-12-15: qty 5

## 2017-12-15 MED ORDER — ROCURONIUM BROMIDE 100 MG/10ML IV SOLN
INTRAVENOUS | Status: DC | PRN
  Administered 2017-12-15: 50 mg via INTRAVENOUS
  Administered 2017-12-15 (×3): 10 mg via INTRAVENOUS

## 2017-12-15 MED ORDER — EPHEDRINE 5 MG/ML INJ
INTRAVENOUS | Status: AC
  Filled 2017-12-15: qty 10

## 2017-12-15 MED ORDER — SUGAMMADEX SODIUM 200 MG/2ML IV SOLN
INTRAVENOUS | Status: DC | PRN
  Administered 2017-12-15: 190.2 mg via INTRAVENOUS

## 2017-12-15 MED ORDER — ACETAMINOPHEN 325 MG PO TABS
650.0000 mg | ORAL_TABLET | Freq: Four times a day (QID) | ORAL | Status: DC
  Administered 2017-12-15 – 2017-12-18 (×10): 650 mg via ORAL
  Filled 2017-12-15 (×12): qty 2

## 2017-12-15 MED ORDER — OXYCODONE HCL 5 MG PO TABS
5.0000 mg | ORAL_TABLET | ORAL | Status: DC | PRN
  Administered 2017-12-15 – 2017-12-16 (×3): 5 mg via ORAL
  Filled 2017-12-15 (×4): qty 1

## 2017-12-15 MED ORDER — PROPOFOL 10 MG/ML IV BOLUS
INTRAVENOUS | Status: DC | PRN
  Administered 2017-12-15: 20 mg via INTRAVENOUS
  Administered 2017-12-15: 30 mg via INTRAVENOUS
  Administered 2017-12-15: 20 mg via INTRAVENOUS
  Administered 2017-12-15: 100 mg via INTRAVENOUS

## 2017-12-15 MED ORDER — FENTANYL CITRATE (PF) 250 MCG/5ML IJ SOLN
INTRAMUSCULAR | Status: DC | PRN
  Administered 2017-12-15 (×6): 50 ug via INTRAVENOUS

## 2017-12-15 MED ORDER — ROCURONIUM BROMIDE 50 MG/5ML IV SOSY
PREFILLED_SYRINGE | INTRAVENOUS | Status: AC
  Filled 2017-12-15: qty 5

## 2017-12-15 MED ORDER — EPHEDRINE SULFATE 50 MG/ML IJ SOLN
INTRAMUSCULAR | Status: DC | PRN
  Administered 2017-12-15 (×2): 5 mg via INTRAVENOUS

## 2017-12-15 MED ORDER — LIDOCAINE HCL (CARDIAC) PF 100 MG/5ML IV SOSY
PREFILLED_SYRINGE | INTRAVENOUS | Status: DC | PRN
  Administered 2017-12-15: 60 mg via INTRAVENOUS

## 2017-12-15 MED ORDER — LIDOCAINE 2% (20 MG/ML) 5 ML SYRINGE
INTRAMUSCULAR | Status: AC
  Filled 2017-12-15: qty 5

## 2017-12-15 MED ORDER — PROPOFOL 10 MG/ML IV BOLUS
INTRAVENOUS | Status: AC
  Filled 2017-12-15: qty 20

## 2017-12-15 MED ORDER — DEXAMETHASONE SODIUM PHOSPHATE 4 MG/ML IJ SOLN
INTRAMUSCULAR | Status: DC | PRN
  Administered 2017-12-15: 5 mg via INTRAVENOUS

## 2017-12-15 MED ORDER — MORPHINE SULFATE (PF) 2 MG/ML IV SOLN
2.0000 mg | INTRAVENOUS | Status: DC | PRN
  Administered 2017-12-16 – 2017-12-17 (×4): 2 mg via INTRAVENOUS
  Filled 2017-12-15 (×4): qty 1

## 2017-12-15 MED ORDER — 0.9 % SODIUM CHLORIDE (POUR BTL) OPTIME
TOPICAL | Status: DC | PRN
  Administered 2017-12-15 (×4): 1000 mL

## 2017-12-15 MED ORDER — PHENYLEPHRINE 40 MCG/ML (10ML) SYRINGE FOR IV PUSH (FOR BLOOD PRESSURE SUPPORT)
PREFILLED_SYRINGE | INTRAVENOUS | Status: AC
  Filled 2017-12-15: qty 10

## 2017-12-15 MED ORDER — HEPARIN SODIUM (PORCINE) 5000 UNIT/ML IJ SOLN
5000.0000 [IU] | Freq: Three times a day (TID) | INTRAMUSCULAR | Status: DC
  Administered 2017-12-16 – 2017-12-18 (×7): 5000 [IU] via SUBCUTANEOUS
  Filled 2017-12-15 (×7): qty 1

## 2017-12-15 MED ORDER — SODIUM CHLORIDE 0.9 % IR SOLN
Status: DC | PRN
  Administered 2017-12-15: 1000 mL

## 2017-12-15 MED ORDER — PHENYLEPHRINE HCL 10 MG/ML IJ SOLN
INTRAMUSCULAR | Status: DC | PRN
  Administered 2017-12-15: 80 ug via INTRAVENOUS

## 2017-12-15 MED ORDER — ONDANSETRON HCL 4 MG/2ML IJ SOLN
INTRAMUSCULAR | Status: DC | PRN
  Administered 2017-12-15: 4 mg via INTRAVENOUS

## 2017-12-15 MED ORDER — DEXAMETHASONE SODIUM PHOSPHATE 10 MG/ML IJ SOLN
INTRAMUSCULAR | Status: AC
  Filled 2017-12-15: qty 1

## 2017-12-15 SURGICAL SUPPLY — 59 items
BLADE CLIPPER SURG (BLADE) ×1 IMPLANT
CANISTER SUCT 3000ML PPV (MISCELLANEOUS) ×2 IMPLANT
COVER SURGICAL LIGHT HANDLE (MISCELLANEOUS) ×4 IMPLANT
DRAPE UTILITY XL STRL (DRAPES) ×1 IMPLANT
DRSG OPSITE POSTOP 4X6 (GAUZE/BANDAGES/DRESSINGS) ×1 IMPLANT
DRSG TEGADERM 2-3/8X2-3/4 SM (GAUZE/BANDAGES/DRESSINGS) ×5 IMPLANT
ELECT BLADE 6.5 EXT (BLADE) ×1 IMPLANT
ELECT CAUTERY BLADE 6.4 (BLADE) ×4 IMPLANT
ELECT REM PT RETURN 9FT ADLT (ELECTROSURGICAL) ×2
ELECTRODE REM PT RTRN 9FT ADLT (ELECTROSURGICAL) ×1 IMPLANT
GAUZE SPONGE 2X2 8PLY STRL LF (GAUZE/BANDAGES/DRESSINGS) ×1 IMPLANT
GLOVE BIO SURGEON STRL SZ 6.5 (GLOVE) ×2 IMPLANT
GLOVE BIO SURGEON STRL SZ8 (GLOVE) ×4 IMPLANT
GLOVE BIOGEL PI IND STRL 6.5 (GLOVE) IMPLANT
GLOVE BIOGEL PI IND STRL 7.0 (GLOVE) IMPLANT
GLOVE BIOGEL PI IND STRL 8 (GLOVE) ×2 IMPLANT
GLOVE BIOGEL PI INDICATOR 6.5 (GLOVE) ×1
GLOVE BIOGEL PI INDICATOR 7.0 (GLOVE) ×2
GLOVE BIOGEL PI INDICATOR 8 (GLOVE) ×2
GLOVE SURG SS PI 6.5 STRL IVOR (GLOVE) ×2 IMPLANT
GOWN STRL REUS W/ TWL LRG LVL3 (GOWN DISPOSABLE) ×6 IMPLANT
GOWN STRL REUS W/ TWL XL LVL3 (GOWN DISPOSABLE) ×2 IMPLANT
GOWN STRL REUS W/TWL LRG LVL3 (GOWN DISPOSABLE) ×6
GOWN STRL REUS W/TWL XL LVL3 (GOWN DISPOSABLE) ×4
KIT TURNOVER KIT B (KITS) ×2 IMPLANT
LIGASURE IMPACT 36 18CM CVD LR (INSTRUMENTS) ×1 IMPLANT
NS IRRIG 1000ML POUR BTL (IV SOLUTION) ×4 IMPLANT
PACK COLON (CUSTOM PROCEDURE TRAY) ×1 IMPLANT
PAD ARMBOARD 7.5X6 YLW CONV (MISCELLANEOUS) ×4 IMPLANT
PENCIL BUTTON HOLSTER BLD 10FT (ELECTRODE) ×3 IMPLANT
RELOAD PROXIMATE 75MM BLUE (ENDOMECHANICALS) ×4 IMPLANT
RELOAD STAPLE 75 3.8 BLU REG (ENDOMECHANICALS) IMPLANT
SCISSORS LAP 5X35 DISP (ENDOMECHANICALS) ×2 IMPLANT
SET IRRIG TUBING LAPAROSCOPIC (IRRIGATION / IRRIGATOR) ×1 IMPLANT
SHEARS HARMONIC ACE PLUS 36CM (ENDOMECHANICALS) ×1 IMPLANT
SLEEVE ENDOPATH XCEL 5M (ENDOMECHANICALS) ×5 IMPLANT
SPECIMEN JAR LARGE (MISCELLANEOUS) ×2 IMPLANT
SPONGE GAUZE 2X2 STER 10/PKG (GAUZE/BANDAGES/DRESSINGS) ×1
STAPLER GUN LINEAR PROX 60 (STAPLE) ×1 IMPLANT
STAPLER PROXIMATE 75MM BLUE (STAPLE) ×1 IMPLANT
STAPLER VISISTAT 35W (STAPLE) ×3 IMPLANT
SUCTION POOLE TIP (SUCTIONS) ×2 IMPLANT
SURGILUBE 2OZ TUBE FLIPTOP (MISCELLANEOUS) IMPLANT
SUT PDS AB 1 TP1 54 (SUTURE) ×2 IMPLANT
SUT PROLENE 2 0 CT2 30 (SUTURE) IMPLANT
SUT PROLENE 2 0 KS (SUTURE) IMPLANT
SUT VIC AB 2-0 SH 18 (SUTURE) ×2 IMPLANT
SUT VIC AB 3-0 SH 18 (SUTURE) ×2 IMPLANT
SUT VICRYL AB 2 0 TIES (SUTURE) ×2 IMPLANT
SUT VICRYL AB 3 0 TIES (SUTURE) ×2 IMPLANT
SYS LAPSCP GELPORT 120MM (MISCELLANEOUS) ×2
SYSTEM LAPSCP GELPORT 120MM (MISCELLANEOUS) IMPLANT
TOWEL GREEN STERILE (TOWEL DISPOSABLE) ×1 IMPLANT
TRAY FOLEY MTR SLVR 16FR STAT (SET/KITS/TRAYS/PACK) ×2 IMPLANT
TROCAR XCEL NON-BLD 5MMX100MML (ENDOMECHANICALS) ×2 IMPLANT
TUBE CONNECTING 12X1/4 (SUCTIONS) ×4 IMPLANT
TUBING INSUF HEATED (TUBING) ×2 IMPLANT
WATER STERILE IRR 1000ML POUR (IV SOLUTION) ×2 IMPLANT
YANKAUER SUCT BULB TIP NO VENT (SUCTIONS) ×4 IMPLANT

## 2017-12-15 NOTE — Addendum Note (Signed)
Addendum  created 12/15/17 1228 by Glynda Jaeger, CRNA   Intraprocedure Event edited

## 2017-12-15 NOTE — Anesthesia Postprocedure Evaluation (Signed)
Anesthesia Post Note  Patient: Robert Horn  Procedure(s) Performed: LAPAROSCOPIC PARTIAL COLECTOMY (N/A Abdomen)     Patient location during evaluation: PACU Anesthesia Type: General Level of consciousness: awake and alert Pain management: pain level controlled Vital Signs Assessment: post-procedure vital signs reviewed and stable Respiratory status: spontaneous breathing, nonlabored ventilation, respiratory function stable and patient connected to nasal cannula oxygen Cardiovascular status: blood pressure returned to baseline and stable Postop Assessment: no apparent nausea or vomiting Anesthetic complications: no    Last Vitals:  Vitals:   12/15/17 1213 12/15/17 1214  BP: 136/69   Pulse: (!) 59 (!) 58  Resp:  20  Temp:    SpO2: 100% 100%    Last Pain:  Vitals:   12/15/17 0452  TempSrc: Oral  PainSc:                  [name]

## 2017-12-15 NOTE — Interval H&P Note (Signed)
History and Physical Interval Note:  12/15/2017 7:57 AM  Robert Horn  has presented today for surgery, with the diagnosis of Colon Mass  The various methods of treatment have been discussed with the patient and family. After consideration of risks, benefits and other options for treatment, the patient has consented to  Procedure(s): LAPAROSCOPIC PARTIAL COLECTOMY (N/A) as a surgical intervention .  The patient's history has been reviewed, patient examined, no change in status, stable for surgery.  I have reviewed the patient's chart and labs.  Questions were answered to the patient's satisfaction.   The procedure was discussed with the patient.  Laparoscopic partial colectomy discussed with the patient as well as non operative treatments. The risks of operative management include bleeding,  Infection,  Leak of anastamosis,  Ostomy formation, open procedure,  Sepsis,  Abcess,  Hernia,  DVT,  Pulmonary complications,  Cardiovascular  complications,  Injury to ureter,  Bladder,kidney,and anesthesia risks,  And death. The patient understands.  Questions answered.   The success of the procedure is 50-85 % for treating the patients symptoms. They agree to proceed.  [place]

## 2017-12-15 NOTE — Op Note (Signed)
Preoperative diagnosis: Transverse colon near obstructing  Mass  Postoperative diagnosis: Same  Procedure: Extended partial colectomy with mobilization of the splenic flexure and creation of side-to-side functional end-to-end anastomosis involving terminal ileum and descending colon  Surgeon: Erroll Luna, MD  Anesthesia: General  Assistant:Focht PA  EBL: 40 cc  Specimen: Ascending colon transverse colon and proximal descending colon to pathology with tattooed mass located in the distal transverse colon.  There was a large chronic serosal tear to the cecum as well.  Drains: None  IV fluids: Per anesthesia record  Indications for procedure: The patient presents with near obstructing transverse colon mass detected on recent screening colonoscopy secondary to anemia and fatigue.  He was admitted last week in the hospital for preoperative work-up was found to have a large near obstructing mass in the distal transverse colon which was biopsied and concerning for malignancy.  This was tattooed.  After discussion of pros and cons of surgery opted to proceed with laparoscopic partial colectomy.The procedure was discussed with the patient.  Laparoscopic partial colectomy discussed with the patient as well as non operative treatments. The risks of operative management include bleeding,  Infection,  Leak of anastamosis,  Ostomy formation, open procedure,  Sepsis,  Abcess,  Hernia,  DVT,  Pulmonary complications,  Cardiovascular  complications,  Injury to ureter,  Bladder,kidney,and anesthesia risks,  And death. The patient understands.  Questions answered.   The success of the procedure is 50-85 % for treating the patients symptoms. They agree to proceed.    Description of procedure: The patient was met in the holding area.  Questions were answered.  He was taken back to the operative room and placed supine upon the operating table.  After induction of general anesthesia, both arms were tucked and a  Foley catheter was placed under sterile conditions.  The abdomen was prepped and draped in sterile fashion and timeout was done.  He received 2 g of cefotetan.  A 5 mm left lower quadrant Optiview port was placed under direct vision.  All layers of the abdominal wall were visualized.  Pneumoperitoneum was increased to 15 mmHg CO2.  Laparoscopy was done.  I placed an additional 2 right sided and 2 left-sided ports.  These were all 5 mm ports placed under direct vision.  The tattooed area was easily identifiable in the distal transverse colon.  Of note he had a large chronic serosal tear in his cecum without perforation.  We began to mobilize the splenic flexure first.  This was then carefully with the harmonic scalpel.  The omentum was dissected off the transverse colon and the transverse colon was mobilized as well.  The hepatic flexure was mobilized and the right colon was mobilized along the right line of Toldt.  The left colon was also mobilized along the left line of Toldt all the way down to the distal descending colon.  Of note there is no evidence of any metastatic disease.  There is one small area where there is a liver capsular tear for mobilization and this was cauterized with good hemostasis.  Once the entire colon was mobilized I placed a midline upper abdomen 7 cm incision with HandPort insertion.  This was done in my hand was inserted and I was able to mobilize the cecum and ascending colon further with blunt dissection and harmonic scalpel.  Once is able to do this the colon was well mobilized I could pull it out through the wound protector.  The distal colon was divided  along the mid descending colon.  We then took the mesentery down with the LigaSure.  This was done all the way around until we got to the terminal ileum where a GIA 75 stapler divided the terminal ileum just proximal to the ligament of Treves.  A stitch was placed to orient the small bowel to prevent twisting of anastomosis creation.   The entire specimen was removed and sent to pathology with the tattooed area noted with a large obstructing mass that was easily palpable.  There is no palpable lymphadenopathy.  I then created a side-to-side functional end-to-end anastomosis with the terminal ileum and ascending colon using a GIA 75 stapler and a TA 60 to close the common enterotomy.  There is no tension on the anastomosis.  A stitch was placed at the end the staple line to prevent tension.  This was hemostatic and the anastomosis was stress tested by pushing contents from the small bowel and the colon with no evidence of leakage of air contents.  There is no tension at the anastomosis and this was widely patent.  Sutures were placed to control some oozing from the staple line which was minimal.  This was placed back in the mid abdomen without any twisting or kinking of the anastomosis.  The mesenteric defect was extremely large and therefore was not closed in this circumstance.  Irrigation was used and suctioned out until clear.  There is no evidence any bleeding or leakage of enteric contents at this point.  The colon protocol was followed and all gloves, gowns, drapes were exchanged per protocol.  Wound was irrigated.  It was closed with #1 PDS and staples for all port sites and incision.  Honeycomb dressing placed.  All final counts were found to be correct.  The patient was awoke extubated taken recovery in satisfactory condition.

## 2017-12-15 NOTE — Progress Notes (Signed)
Notified pharmacy for [place] to send to OR with patien, Per Steve Rattler Rx Tech-meds will be sent to the OR by pharmacy and not to the floor to send with the patient. Jessie Foot, RN

## 2017-12-15 NOTE — Anesthesia Procedure Notes (Signed)
Procedure Name: Intubation Date/Time: 12/15/2017 8:35 AM Performed by: Glynda Jaeger, CRNA Pre-anesthesia Checklist: Patient identified, Patient being monitored, Timeout performed, Emergency Drugs available and Suction available Patient Re-evaluated:Patient Re-evaluated prior to induction Oxygen Delivery Method: Circle System Utilized Preoxygenation: Pre-oxygenation with 100% oxygen Induction Type: IV induction Ventilation: Mask ventilation without difficulty Laryngoscope Size: Mac and 4 Grade View: Grade I Tube type: Oral Tube size: 7.5 mm Number of attempts: 1 Airway Equipment and Method: Stylet Placement Confirmation: ETT inserted through vocal cords under direct vision,  positive ETCO2 and breath sounds checked- equal and bilateral Secured at: 22 cm Tube secured with: Tape Dental Injury: Teeth and Oropharynx as per pre-operative assessment

## 2017-12-15 NOTE — Progress Notes (Signed)
[facility] PROGRESS NOTE  Robert Horn XIP:[id number] DOB: 1933-04-21 DOA: 12/10/2017  PCP: [name], MD  Brief History/Interval Summary: 84 y.o.malewith medical history significant ofhypertension, dyslipidemia status postTAVRSin 12/15/2016 by Dr. [name], systolic and diastolic heart failure with an EF of 45% hypertension that was sent here from the gastroenterologist office Dr. Michail Sermon as he did a colonoscopy which showed a colonic obstructive mass.   Reason for Visit: Obstructive colonic mass  Consultants: General surgery.  Cardiology.  Procedures: Extended partial colectomy with mobilization of the splenic flexure and creation of side-to-side functional end-to-end anastomosis involving terminal ileum and descending colon  Transthoracic echocardiogram 12/10/17 Study Conclusions  - Left ventricle: The cavity size was normal. Wall thickness was   normal. Systolic function was moderately reduced. The estimated   ejection fraction was in the range of 35% to 40%. Diffuse   hypokinesis. Doppler parameters are consistent with abnormal left   ventricular relaxation (grade 1 diastolic dysfunction). No   evidence of thrombus. - Aortic valve: A TAVR stent-valve bioprosthesis was present and   functioning normally. Valve area (VTI): 1.31 cm^2. Valve area   (Vmax): 1.31 cm^2. Valve area (Vmean): 1.34 cm^2. - Left atrium: The atrium was moderately dilated. - Atrial septum: No defect or patent foramen ovale was identified. Impressions: - LV EF is lower than reported on the last study from October 2018,   but direct image comarison shows similar findings. TAVR gradients   are higher than in October 2018, but similar to those reported in   September 2018.  Antibiotics: None  Subjective/Interval History: Patient seen after he from the OR.  Complains of 5 out of 10 pain in his abdomen.  Denies any chest pain or shortness of breath.  ROS: Denies any nausea or  vomiting  Objective:  Vital Signs  Vitals:   12/15/17 1315 12/15/17 1330 12/15/17 1345 12/15/17 1413  BP: 126/65 131/60 132/65 136/68  Pulse: (!) 52 (!) 56 (!) 57 (!) 53  Resp: 13 14 13 14   Temp:      TempSrc:      SpO2: 93% 94% 93% 94%  Weight:      Height:        Intake/Output Summary (Last 24 hours) at 12/15/2017 1454 Last data filed at 12/15/2017 1202 Gross per 24 hour  Intake 3080 ml  Output 30 ml  Net 3050 ml   Filed Weights   12/13/17 0452 12/14/17 0500 12/15/17 0452  Weight: 95 kg 95.7 kg 95.1 kg    General appearance: alert, cooperative, appears stated age and no distress Head: Normocephalic, without obvious abnormality, atraumatic Resp: clear to auscultation bilaterally Cardio: regular rate and rhythm, S1, S2 normal, no murmur, click, rub or gallop GI: Abdomen is soft.  Mildly tender.  No bowel sounds heard. Extremities: extremities normal, atraumatic, no cyanosis or edema Neurologic: No masses or organomegaly  Lab Results:  Data Reviewed: I have personally reviewed following labs and imaging studies  CBC: Recent Labs  Lab 12/10/17 1220 12/11/17 0726 12/13/17 0630 12/15/17 0542  WBC 4.5 4.4 4.3 4.7  HGB 10.1* 9.0* 9.1* 9.6*  HCT 32.6* 29.6* 29.1* 30.1*  MCV 87.6 88.1 87.1 86.2  PLT 143* PLATELET CLUMPS NOTED ON SMEAR, UNABLE TO ESTIMATE 134* 673    Basic Metabolic Panel: Recent Labs  Lab 12/11/17 0726 12/12/17 0545 12/13/17 0630 12/14/17 1018 12/15/17 0542  NA 139 141 141 139 138  K 3.8 3.8 4.2 4.0 3.9  CL 107 106 109 104 104  CO2 24  25 26 26 25   GLUCOSE 99 107* 111* 107* 99  BUN 10 10 8 8 9   CREATININE 1.37* 1.27* 1.18 1.21 1.30*  CALCIUM 8.7* 9.2 9.1 9.8 9.0  MG  --  2.3  --   --   --     GFR: Estimated Creatinine Clearance: 52.4 mL/min (A) (by C-G formula based on SCr of 1.3 mg/dL (H)).  Liver Function Tests: Recent Labs  Lab 12/11/17 0726  AST 19  ALT 11  ALKPHOS 49  BILITOT 0.7  PROT 6.4*  ALBUMIN 3.1*     BNP  (last 3 results) Recent Labs    01/07/17 1451  PROBNP 474    HbA1C: Recent Labs    12/13/17 1642  HGBA1C 6.4*     Recent Results (from the past 240 hour(s))  Surgical PCR screen     Status: None   Collection Time: 12/12/17 11:33 AM  Result Value Ref Range Status   MRSA, PCR NEGATIVE NEGATIVE Final   Staphylococcus aureus NEGATIVE NEGATIVE Final    Comment: (NOTE) The Xpert SA Assay (FDA approved for NASAL specimens in patients 21 years of age and older), is one component of a comprehensive surveillance program. It is not intended to diagnose infection nor to guide or monitor treatment. Performed at [facility], [place] [address], [place], [place] [address]       Radiology Studies: No results found.   Medications:  Scheduled: . acetaminophen  650 mg Oral Q6H  . amLODipine  10 mg Oral Daily  . aspirin EC  81 mg Oral Daily  . carvedilol  3.125 mg Oral BID  . feeding supplement (ENSURE ENLIVE)  237 mL Oral TID BM  . [START ON 12/16/2017] heparin  5,000 Units Subcutaneous Q8H  . hydrALAZINE  25 mg Oral Q8H  . sodium chloride flush  3 mL Intravenous Q12H   Continuous: . sodium chloride    . 0.9 % NaCl with KCl 20 mEq / L     LFY:BOFBPZ chloride, morphine injection, ondansetron **OR** ondansetron (ZOFRAN) IV, oxyCODONE, sodium chloride flush  Assessment/Plan:    Obstructing colonic mass Preliminary path report suggested adenocarcinoma.  Patient seen by general surgery.  Clearance obtained from cardiology.  Patient underwent surgery this morning.  Seems to be stable postoperatively.  Further management per general surgery.  Await final pathology.  Chronic combined systolic and diastolic CHF Currently stable from a hemodynamic standpoint.  EF is 35 to 40% based on echocardiogram done during this hospitalization.  Seen by cardiology.  Continue to monitor volume status closely.  Continue with beta-blocker.  Patient was on Benicar at home which has been held.   Monitor renal function closely.  History of Vent tachycardia Continue to monitor on telemetry.  Monitor electrolytes closely.  Status post TAVR Stable per echocardiogram.  Patient was on dual antiplatelet treatment previously.  Per cardiology okay to discontinue Plavix as he has completed 6 months of DAPT.  Resume aspirin when okay with general surgery.  Bradycardia Dose of carvedilol was reduced to 3.25 mg twice a day.  Continue to monitor heart rate.  Check TSH.  Essential hypertension Monitor blood pressures closely.   DVT Prophylaxis: Per general surgery    Code Status: Full code Family Communication: Discussed with the patient Disposition Plan: Management as outlined above.    LOS: 5 days   [place] [facility] Pager [phone] 12/15/2017, 2:54 PM  If 7PM-7AM, please contact night-coverage at [email or web address], password [facility]

## 2017-12-15 NOTE — Progress Notes (Signed)
Pt provided and completed Pre Surgical Carbohydrate drink per pre-op orders. Jessie Foot, RN

## 2017-12-15 NOTE — Transfer of Care (Signed)
Immediate Anesthesia Transfer of Care Note  Patient: Robert Horn  Procedure(s) Performed: LAPAROSCOPIC PARTIAL COLECTOMY (N/A Abdomen)  Patient Location: PACU  Anesthesia Type:General  Level of Consciousness: patient cooperative and responds to stimulation  Airway & Oxygen Therapy: Patient Spontanous Breathing and Patient connected to face mask oxygen  Post-op Assessment: Report given to RN, Post -op Vital signs reviewed and stable and Patient moving all extremities X 4  Post vital signs: Reviewed and stable  Last Vitals:  Vitals Value Taken Time  BP 136/69 12/15/2017 12:13 PM  Temp    Pulse 58 12/15/2017 12:14 PM  Resp 20 12/15/2017 12:14 PM  SpO2 100 % 12/15/2017 12:14 PM  Vitals shown include unvalidated device data.  Last Pain:  Vitals:   12/15/17 0452  TempSrc: Oral  PainSc:       Patients Stated Pain Goal: 0 (26/33/35 4562)  Complications: No apparent anesthesia complications

## 2017-12-15 NOTE — Progress Notes (Signed)
Notified by CCMD patient had 2.04 second pause/slow ventricular response. Pt sleeping soundly-did not awaken. No distress noted. Will continue to monitor. Strip saved. Jessie Foot, RN

## 2017-12-16 ENCOUNTER — Encounter (HOSPITAL_COMMUNITY): Payer: Self-pay | Admitting: Surgery

## 2017-12-16 DIAGNOSIS — D649 Anemia, unspecified: Secondary | ICD-10-CM

## 2017-12-16 DIAGNOSIS — N179 Acute kidney failure, unspecified: Secondary | ICD-10-CM

## 2017-12-16 LAB — CBC
HEMATOCRIT: 28.5 % — AB (ref 39.0–52.0)
Hemoglobin: 8.8 g/dL — ABNORMAL LOW (ref 13.0–17.0)
MCH: 27 pg (ref 26.0–34.0)
MCHC: 30.9 g/dL (ref 30.0–36.0)
MCV: 87.4 fL (ref 78.0–100.0)
Platelets: 133 10*3/uL — ABNORMAL LOW (ref 150–400)
RBC: 3.26 MIL/uL — ABNORMAL LOW (ref 4.22–5.81)
RDW: 14.3 % (ref 11.5–15.5)
WBC: 11.5 10*3/uL — ABNORMAL HIGH (ref 4.0–10.5)

## 2017-12-16 LAB — TSH: TSH: 0.505 u[IU]/mL (ref 0.350–4.500)

## 2017-12-16 LAB — BASIC METABOLIC PANEL
Anion gap: 9 (ref 5–15)
BUN: 15 mg/dL (ref 8–23)
CO2: 23 mmol/L (ref 22–32)
CREATININE: 1.54 mg/dL — AB (ref 0.61–1.24)
Calcium: 8.3 mg/dL — ABNORMAL LOW (ref 8.9–10.3)
Chloride: 107 mmol/L (ref 98–111)
GFR calc Af Amer: 47 mL/min — ABNORMAL LOW (ref >60)
GFR calc non Af Amer: 40 mL/min — ABNORMAL LOW (ref >60)
Glucose, Bld: 108 mg/dL — ABNORMAL HIGH (ref 70–99)
Potassium: 4.8 mmol/L (ref 3.5–5.1)
Sodium: 139 mmol/L (ref 135–145)

## 2017-12-16 MED ORDER — SODIUM CHLORIDE 0.9 % IV BOLUS
500.0000 mL | Freq: Once | INTRAVENOUS | Status: AC
  Administered 2017-12-16: 500 mL via INTRAVENOUS

## 2017-12-16 NOTE — Progress Notes (Signed)
[facility] PROGRESS NOTE  Ras Kollman LGX:[id number] DOB: 17-Oct-1933 DOA: 12/10/2017  PCP: [name], MD  Brief History/Interval Summary: 84 y.o.malewith medical history significant ofhypertension, dyslipidemia status postTAVRSin 12/15/2016 by Dr. [name], systolic and diastolic heart failure with an EF of 45% hypertension that was sent here from the gastroenterologist office Dr. Michail Sermon as he did a colonoscopy which showed a colonic obstructive mass. Patient was seen by general surgery and cardiology.  Cardiology cleared him to undergo surgery.  He underwent partial colectomy with end-to-end anastomosis.  Reason for Visit: Obstructive colonic mass  Consultants: General surgery.  Cardiology.  Procedures: Extended partial colectomy with mobilization of the splenic flexure and creation of side-to-side functional end-to-end anastomosis involving terminal ileum and descending colon  Transthoracic echocardiogram 12/10/17 Study Conclusions  - Left ventricle: The cavity size was normal. Wall thickness was   normal. Systolic function was moderately reduced. The estimated   ejection fraction was in the range of 35% to 40%. Diffuse   hypokinesis. Doppler parameters are consistent with abnormal left   ventricular relaxation (grade 1 diastolic dysfunction). No   evidence of thrombus. - Aortic valve: A TAVR stent-valve bioprosthesis was present and   functioning normally. Valve area (VTI): 1.31 cm^2. Valve area   (Vmax): 1.31 cm^2. Valve area (Vmean): 1.34 cm^2. - Left atrium: The atrium was moderately dilated. - Atrial septum: No defect or patent foramen ovale was identified. Impressions: - LV EF is lower than reported on the last study from October 2018,   but direct image comarison shows similar findings. TAVR gradients   are higher than in October 2018, but similar to those reported in   September 2018.  Antibiotics: None  Subjective/Interval History: Patient states  that he slept well overnight.  Did have some abdominal pain overnight which was 5 out of 10 in intensity.  Denies any nausea vomiting.  Not passing any gas yet.  Denies any chest pain or shortness of breath  ROS: Denies any headaches  Objective:  Vital Signs  Vitals:   12/15/17 1413 12/15/17 2139 12/16/17 0425 12/16/17 0829  BP: 136/68 (!) 112/59 (!) 124/58 132/67  Pulse: (!) 53 65 63 62  Resp: 14 16 18 18   Temp:  98.4 F (36.9 C) 98.4 F (36.9 C) 98.7 F (37.1 C)  TempSrc:  Oral Oral Oral  SpO2: 94% 98% 96% 97%  Weight:      Height:        Intake/Output Summary (Last 24 hours) at 12/16/2017 0906 Last data filed at 12/16/2017 0500 Gross per 24 hour  Intake 2547.23 ml  Output 1230 ml  Net 1317.23 ml   Filed Weights   12/13/17 0452 12/14/17 0500 12/15/17 0452  Weight: 95 kg 95.7 kg 95.1 kg    General appearance: Awake alert.  In no distress Resp: Clear to auscultation bilaterally.  No wheezing rales or rhonchi Cardio: S1-S2 is normal regular.  No S3-S4.  No rubs murmurs or bruit. GI: Abdomen remains soft.  Mildly tender.  Bowel sounds are appreciated. Extremities: No edema Alert and oriented x3.  No obvious focal neurological deficits.  Lab Results:  Data Reviewed: I have personally reviewed following labs and imaging studies  CBC: Recent Labs  Lab 12/10/17 1220 12/11/17 0726 12/13/17 0630 12/15/17 0542 12/16/17 0410  WBC 4.5 4.4 4.3 4.7 11.5*  HGB 10.1* 9.0* 9.1* 9.6* 8.8*  HCT 32.6* 29.6* 29.1* 30.1* 28.5*  MCV 87.6 88.1 87.1 86.2 87.4  PLT 143* PLATELET CLUMPS NOTED ON SMEAR, UNABLE TO  ESTIMATE 134* 157 133*    Basic Metabolic Panel: Recent Labs  Lab 12/12/17 0545 12/13/17 0630 12/14/17 1018 12/15/17 0542 12/16/17 0410  NA 141 141 139 138 139  K 3.8 4.2 4.0 3.9 4.8  CL 106 109 104 104 107  CO2 25 26 26 25 23   GLUCOSE 107* 111* 107* 99 108*  BUN 10 8 8 9 15   CREATININE 1.27* 1.18 1.21 1.30* 1.54*  CALCIUM 9.2 9.1 9.8 9.0 8.3*  MG 2.3  --   --    --   --     GFR: Estimated Creatinine Clearance: 44.3 mL/min (A) (by C-G formula based on SCr of 1.54 mg/dL (H)).  Liver Function Tests: Recent Labs  Lab 12/11/17 0726  AST 19  ALT 11  ALKPHOS 49  BILITOT 0.7  PROT 6.4*  ALBUMIN 3.1*    HbA1C: Recent Labs    12/13/17 1642  HGBA1C 6.4*     Recent Results (from the past 240 hour(s))  Surgical PCR screen     Status: None   Collection Time: 12/12/17 11:33 AM  Result Value Ref Range Status   MRSA, PCR NEGATIVE NEGATIVE Final   Staphylococcus aureus NEGATIVE NEGATIVE Final    Comment: (NOTE) The Xpert SA Assay (FDA approved for NASAL specimens in patients 55 years of age and older), is one component of a comprehensive surveillance program. It is not intended to diagnose infection nor to guide or monitor treatment. Performed at [facility], [place] [address] [place], [place] [address]       Radiology Studies: No results found.   Medications:  Scheduled: . acetaminophen  650 mg Oral Q6H  . amLODipine  10 mg Oral Daily  . aspirin EC  81 mg Oral Daily  . carvedilol  3.125 mg Oral BID  . feeding supplement (ENSURE ENLIVE)  237 mL Oral TID BM  . heparin  5,000 Units Subcutaneous Q8H  . hydrALAZINE  25 mg Oral Q8H  . sodium chloride flush  3 mL Intravenous Q12H   Continuous: . sodium chloride    . 0.9 % NaCl with KCl 20 mEq / L 75 mL/hr at 12/16/17 0500   KNL:ZJQBHA chloride, morphine injection, ondansetron **OR** ondansetron (ZOFRAN) IV, oxyCODONE, sodium chloride flush  Assessment/Plan:    Obstructing colonic mass Preliminary path report suggested adenocarcinoma.  Patient seen by general surgery.  Clearance obtained from cardiology.  Patient underwent partial colectomy with end-to-end anastomosis on 9/18.  Patient is stable postoperatively.  Further management per general surgery.  Okay to discontinue Foley catheter but will defer to general surgery.  Mobilize.  Await final pathology.    Chronic  combined systolic and diastolic CHF Currently stable from a hemodynamic standpoint.  EF is 35 to 40% based on echocardiogram done during this hospitalization.  Seen by cardiology.  Continue to watch volume status closely.  Continue with beta-blocker.  Patient was on Benicar at home which has been held.  Creatinine noted to be slightly high today.    Acute renal failure Creatinine noted will be 1.54 today.  Probably due to surgery poor oral intake.  Give bolus.  Also continue with IV hydration.  Monitor urine output.  Repeat labs tomorrow.  History of Vent tachycardia Continue to monitor on telemetry.  Monitor electrolytes closely.  No further recurrence.  Status post TAVR Stable per echocardiogram.  Patient was on dual antiplatelet treatment previously.  Per cardiology okay to discontinue Plavix as he has completed 6 months of DAPT.  Aspirin  has been resumed.  Bradycardia Dose of carvedilol was reduced to 3.25 mg twice a day.  Heart rate has improved.  TSH is normal at 0.5.  Essential hypertension Blood pressure is reasonably well controlled.  Continue to monitor.  Normocytic anemia Mild drop in hemoglobin is likely due to operative loss.  No other overt bleeding noted.  Continue to monitor.   DVT Prophylaxis: Subcutaneous heparin Code Status: Full code Family Communication: Discussed with the patient Disposition Plan: Mobilize.    LOS: 6 days   [place] [facility] Pager [phone] 12/16/2017, 9:06 AM  If 7PM-7AM, please contact night-coverage at [email or web address], password [facility]

## 2017-12-16 NOTE — Discharge Instructions (Signed)
[facility]      [facility] [phone]  OPEN ABDOMINAL SURGERY: POST OP INSTRUCTIONS  Always review your discharge instruction sheet given to you by the facility where your surgery was performed.  IF YOU HAVE DISABILITY OR FAMILY LEAVE FORMS, YOU MUST BRING THEM TO THE OFFICE FOR PROCESSING.  PLEASE DO NOT GIVE THEM TO YOUR DOCTOR.  1. A prescription for pain medication may be given to you upon discharge.  Take your pain medication as prescribed, if needed.  If narcotic pain medicine is not needed, then you may take acetaminophen (Tylenol) or ibuprofen (Advil) as needed. 2. Take your usually prescribed medications unless otherwise directed. 3. If you need a refill on your pain medication, please contact your pharmacy. They will contact our office to request authorization.  Prescriptions will not be filled after 5pm or on week-ends. 4. You should follow a light diet the first few days after arrival home, such as soup and crackers, pudding, etc.unless your doctor has advised otherwise. A high-fiber, low fat diet can be resumed as tolerated.   Be sure to include lots of fluids daily. Most patients will experience some swelling and bruising on the chest and neck area.  Ice packs will help.  Swelling and bruising can take several days to resolve 5. Most patients will experience some swelling and bruising in the area of the incision. Ice pack will help. Swelling and bruising can take several days to resolve..  6. It is common to experience some constipation if taking pain medication after surgery.  Increasing fluid intake and taking a stool softener will usually help or prevent this problem from occurring.  A mild laxative (Milk of Magnesia or Miralax) should be taken according to package directions if there are no bowel movements after 48 hours. 7.  You may have steri-strips (small skin tapes) in place directly over the incision.  These strips should be left on the skin for 7-10 days.  If your  surgeon used skin glue on the incision, you may shower in 24 hours.  The glue will flake off over the next 2-3 weeks.  Any sutures or staples will be removed at the office during your follow-up visit. You may find that a light gauze bandage over your incision may keep your staples from being rubbed or pulled. You may shower and replace the bandage daily. 8. ACTIVITIES:  You may resume regular (light) daily activities beginning the next day--such as daily self-care, walking, climbing stairs--gradually increasing activities as tolerated.  You may have sexual intercourse when it is comfortable.  Refrain from any heavy lifting or straining until approved by your doctor. a. You may drive when you no longer are taking prescription pain medication, you can comfortably wear a seatbelt, and you can safely maneuver your car and apply brakes b. Return to Work: ___________________________________ 9. You should see your doctor in the office for a follow-up appointment approximately two weeks after your surgery.  Make sure that you call for this appointment within a day or two after you arrive home to insure a convenient appointment time. OTHER INSTRUCTIONS:  _____________________________________________________________ _____________________________________________________________  WHEN TO CALL YOUR DOCTOR: 1. Fever over 101.0 2. Inability to urinate 3. Nausea and/or vomiting 4. Extreme swelling or bruising 5. Continued bleeding from incision. 6. Increased pain, redness, or drainage from the incision. 7. Difficulty swallowing or breathing 8. Muscle cramping or spasms. 9. Numbness or tingling in hands or feet or around lips.  The clinic staff is available to   answer your questions during regular business hours.  Please don't hesitate to call and ask to speak to one of the nurses if you have concerns.  For further questions, please visit [email or web address]   

## 2017-12-16 NOTE — Care Management Important Message (Signed)
Important Message  Patient Details  Name: Robert Horn MRN: [id number] Date of Birth: June 21, 1933   Medicare Important Message Given:  Yes    Kylieann Eagles [address] 12/16/2017, 3:12 PM

## 2017-12-16 NOTE — Plan of Care (Signed)
  Problem: Pain Managment: Goal: General experience of comfort will improve Outcome: Progressing   Problem: Safety: Goal: Ability to remain free from injury will improve Outcome: Progressing   Problem: Skin Integrity: Goal: Risk for impaired skin integrity will decrease Outcome: Progressing   

## 2017-12-16 NOTE — Progress Notes (Signed)
[facility]/Trauma Progress Note  1 Day Post-Op   Assessment/Plan Principal Problem:   Colonic mass Active Problems:   Essential hypertension   Chronic combined systolic and diastolic heart failure (HCC)   Severe aortic stenosis   S/P TAVR (transcatheter aortic valve replacement)  Near obstructing transverse colon mass  - S/P Extended partial colectomy with mobilization of the splenic flexure and creation of side-to-side functional end-to-end anastomosis involving terminal ileum and descending colon, Dr. Brantley Stage, 09/18  FEN: clears VTE: SCD's, heparin ID: pre-op abx, WBC 11.5, afebrile Foley: DC today Follow up:  Dr. Brantley Stage  DISPO:  Await return of bowel function, ambulate. Okay for clears.     LOS: 6 days    Subjective: CC: S/P partial colectomy  Pt is having pain only when he coughs. No issues overnight. He has not been out of bed since surgery. He denies nausea, vomiting, fever, chills or flatus. Wife at bedside.   Objective: Vital signs in last 24 hours: Temp:  [97.2 F (36.2 C)-98.7 F (37.1 C)] 98.7 F (37.1 C) (09/19 0829) Pulse Rate:  [52-65] 62 (09/19 0829) Resp:  [13-20] 18 (09/19 0829) BP: (112-136)/(58-84) 132/67 (09/19 0829) SpO2:  [93 %-100 %] 97 % (09/19 0829) Last BM Date: 12/14/17  Intake/Output from previous day: 09/18 0701 - 09/19 0700 In: 2747.2 [I.V.:2747.2] Out: 1230 [Urine:1220; Blood:10] Intake/Output this shift: No intake/output data recorded.  PE: Gen:  Alert, NAD, pleasant, cooperative Pulm: rate and effort normal Abd: Soft, ND, +BS, incisions C/D/I, mild TTP around midline incision, no guarding, no peritonitis Skin: no rashes noted, warm and dry   Anti-infectives: Anti-infectives (From admission, onward)   Start     Dose/Rate Route Frequency Ordered Stop   12/15/17 0900  cefoTEtan (CEFOTAN) 2 g in sodium chloride 0.9 % 100 mL IVPB  Status:  Discontinued     2 g 200 mL/hr over 30 Minutes Intravenous Every 12  hours 12/14/17 0949 12/14/17 1104   12/15/17 0800  cefoTEtan (CEFOTAN) 2 g in sodium chloride 0.9 % 100 mL IVPB     2 g 200 mL/hr over 30 Minutes Intravenous On call to O.R. 12/14/17 1104 12/15/17 0923   12/15/17 0800  clindamycin (CLEOCIN) 900 mg, gentamicin (GARAMYCIN) 240 mg in sodium chloride 0.9 % 1,000 mL for intraperitoneal lavage  Status:  Discontinued      Intraperitoneal To Surgery 12/14/17 1108 12/15/17 1239   12/14/17 1400  neomycin (MYCIFRADIN) tablet 1,000 mg  Status:  Discontinued     1,000 mg Oral 3 times per day on Tue 12/13/17 1903 12/14/17 1045   12/14/17 1400  metroNIDAZOLE (FLAGYL) tablet 1,000 mg  Status:  Discontinued     1,000 mg Oral 3 times per day on Tue 12/13/17 1903 12/14/17 1045   12/14/17 1400  neomycin (MYCIFRADIN) tablet 1,000 mg     1,000 mg Oral 3 times per day 12/14/17 0911 12/14/17 2234   12/14/17 1400  metroNIDAZOLE (FLAGYL) tablet 1,000 mg     1,000 mg Oral 3 times per day 12/14/17 0911 12/14/17 2234   12/14/17 0915  cefoTEtan (CEFOTAN) 2 g in sodium chloride 0.9 % 100 mL IVPB  Status:  Discontinued     2 g 200 mL/hr over 30 Minutes Intravenous On call to O.R. 12/14/17 0911 12/14/17 0956   12/14/17 0900  clindamycin (CLEOCIN) 900 mg, gentamicin (GARAMYCIN) 240 mg in sodium chloride 0.9 % 1,000 mL for intraperitoneal lavage  Status:  Discontinued      Intraperitoneal To Surgery 12/13/17 1634  12/14/17 1108   12/14/17 0855  cefoTEtan (CEFOTAN) 2 g in sodium chloride 0.9 % 100 mL IVPB  Status:  Discontinued     2 g 200 mL/hr over 30 Minutes Intravenous To ShortStay Surgical 12/13/17 1634 12/14/17 0956   12/13/17 1800  neomycin (MYCIFRADIN) tablet 1,000 mg  Status:  Discontinued     1,000 mg Oral 3 times per day on Mon 12/13/17 1634 12/13/17 1905   12/13/17 1800  metroNIDAZOLE (FLAGYL) tablet 1,000 mg  Status:  Discontinued     1,000 mg Oral 3 times per day on Mon 12/13/17 1634 12/13/17 1905      Lab Results:  Recent Labs    12/15/17 0542  12/16/17 0410  WBC 4.7 11.5*  HGB 9.6* 8.8*  HCT 30.1* 28.5*  PLT 157 133*   BMET Recent Labs    12/15/17 0542 12/16/17 0410  NA 138 139  K 3.9 4.8  CL 104 107  CO2 25 23  GLUCOSE 99 108*  BUN 9 15  CREATININE 1.30* 1.54*  CALCIUM 9.0 8.3*   PT/INR No results for input(s): LABPROT, INR in the last 72 hours. CMP     Component Value Date/Time   NA 139 12/16/2017 0410   NA 142 01/07/2017 1451   K 4.8 12/16/2017 0410   CL 107 12/16/2017 0410   CO2 23 12/16/2017 0410   GLUCOSE 108 (H) 12/16/2017 0410   BUN 15 12/16/2017 0410   BUN 25 01/07/2017 1451   CREATININE 1.54 (H) 12/16/2017 0410   CREATININE 1.25 (H) 12/17/2015 0825   CALCIUM 8.3 (L) 12/16/2017 0410   PROT 6.4 (L) 12/11/2017 0726   ALBUMIN 3.1 (L) 12/11/2017 0726   AST 19 12/11/2017 0726   ALT 11 12/11/2017 0726   ALKPHOS 49 12/11/2017 0726   BILITOT 0.7 12/11/2017 0726   GFRNONAA 40 (L) 12/16/2017 0410   GFRAA 47 (L) 12/16/2017 0410   Lipase  No results found for: LIPASE  Studies/Results: No results found.    Kalman Drape , [facility] 12/16/2017, 10:30 AM  Pager: [phone] Mon-Wed, Friday 7:00am-4:30pm Thurs 7am-11:30am  Consults: [phone]

## 2017-12-17 LAB — CBC
HCT: 29.1 % — ABNORMAL LOW (ref 39.0–52.0)
HEMOGLOBIN: 9 g/dL — AB (ref 13.0–17.0)
MCH: 27.4 pg (ref 26.0–34.0)
MCHC: 30.9 g/dL (ref 30.0–36.0)
MCV: 88.4 fL (ref 78.0–100.0)
Platelets: UNDETERMINED 10*3/uL (ref 150–400)
RBC: 3.29 MIL/uL — AB (ref 4.22–5.81)
RDW: 14.5 % (ref 11.5–15.5)
WBC: 8.1 10*3/uL (ref 4.0–10.5)

## 2017-12-17 LAB — BASIC METABOLIC PANEL
ANION GAP: 6 (ref 5–15)
BUN: 14 mg/dL (ref 8–23)
CALCIUM: 8.5 mg/dL — AB (ref 8.9–10.3)
CO2: 26 mmol/L (ref 22–32)
CREATININE: 1.13 mg/dL (ref 0.61–1.24)
Chloride: 108 mmol/L (ref 98–111)
GFR calc Af Amer: >60 mL/min (ref >60)
GFR, EST NON AFRICAN AMERICAN: 59 mL/min — AB (ref >60)
Glucose, Bld: 99 mg/dL (ref 70–99)
Potassium: 4.3 mmol/L (ref 3.5–5.1)
Sodium: 140 mmol/L (ref 135–145)

## 2017-12-17 NOTE — Progress Notes (Signed)
[facility] PROGRESS NOTE  Robert Horn QPR:[id number] DOB: Apr 04, 1933 DOA: 12/10/2017  PCP: [name], MD  Brief History/Interval Summary: 84 y.o.malewith medical history significant ofhypertension, dyslipidemia status postTAVRSin 12/15/2016 by Dr. [name], systolic and diastolic heart failure with an EF of 45% hypertension that was sent here from the gastroenterologist office Dr. Michail Sermon as he did a colonoscopy which showed a colonic obstructive mass. Patient was seen by general surgery and cardiology.  Cardiology cleared him to undergo surgery.  He underwent partial colectomy with end-to-end anastomosis.  Reason for Visit: Obstructive colonic mass  Consultants: General surgery.  Cardiology.  Procedures: Extended partial colectomy with mobilization of the splenic flexure and creation of side-to-side functional end-to-end anastomosis involving terminal ileum and descending colon  Transthoracic echocardiogram 12/10/17 Study Conclusions  - Left ventricle: The cavity size was normal. Wall thickness was   normal. Systolic function was moderately reduced. The estimated   ejection fraction was in the range of 35% to 40%. Diffuse   hypokinesis. Doppler parameters are consistent with abnormal left   ventricular relaxation (grade 1 diastolic dysfunction). No   evidence of thrombus. - Aortic valve: A TAVR stent-valve bioprosthesis was present and   functioning normally. Valve area (VTI): 1.31 cm^2. Valve area   (Vmax): 1.31 cm^2. Valve area (Vmean): 1.34 cm^2. - Left atrium: The atrium was moderately dilated. - Atrial septum: No defect or patent foramen ovale was identified. Impressions: - LV EF is lower than reported on the last study from October 2018,   but direct image comarison shows similar findings. TAVR gradients   are higher than in October 2018, but similar to those reported in   September 2018.  Antibiotics: None  Subjective/Interval History: Patient states  that he had a rough night due to pain issues.  Feels better this morning.  No nausea vomiting.  Passing gas.  He has ambulated.  Denies any shortness of breath or chest pains.  ROS: Denies any headaches.  Objective:  Vital Signs  Vitals:   12/16/17 0829 12/16/17 1254 12/16/17 2352 12/17/17 0402  BP: 132/67 128/76 130/62 (!) 167/83  Pulse: 62  66 62  Resp: 18 18 17 20   Temp: 98.7 F (37.1 C) 98.5 F (36.9 C) 98.4 F (36.9 C) 98.3 F (36.8 C)  TempSrc: Oral Oral Oral Oral  SpO2: 97% 96% 99% 96%  Weight:      Height:        Intake/Output Summary (Last 24 hours) at 12/17/2017 0927 Last data filed at 12/17/2017 0830 Gross per 24 hour  Intake 1664.16 ml  Output 500 ml  Net 1164.16 ml   Filed Weights   12/13/17 0452 12/14/17 0500 12/15/17 0452  Weight: 95 kg 95.7 kg 95.1 kg    General appearance: Awake alert.  In no distress Resp: Normal effort.  Clear to auscultation bilaterally. Cardio: S1-S2 is normal regular.  No S3-S4.  No rubs murmurs or bruit GI: Abdomen remains soft.  Nondistended.  Dressings noted.  Bowel sounds present. Extremities: No pedal edema Alert and oriented x3.  No focal neurological deficits.  Lab Results:  Data Reviewed: I have personally reviewed following labs and imaging studies  CBC: Recent Labs  Lab 12/11/17 0726 12/13/17 0630 12/15/17 0542 12/16/17 0410 12/17/17 0532  WBC 4.4 4.3 4.7 11.5* 8.1  HGB 9.0* 9.1* 9.6* 8.8* 9.0*  HCT 29.6* 29.1* 30.1* 28.5* 29.1*  MCV 88.1 87.1 86.2 87.4 88.4  PLT PLATELET CLUMPS NOTED ON SMEAR, UNABLE TO ESTIMATE 134* 157 133* PLATELET CLUMPS NOTED  ON SMEAR, UNABLE TO ESTIMATE    Basic Metabolic Panel: Recent Labs  Lab 12/12/17 0545 12/13/17 0630 12/14/17 1018 12/15/17 0542 12/16/17 0410 12/17/17 0532  NA 141 141 139 138 139 140  K 3.8 4.2 4.0 3.9 4.8 4.3  CL 106 109 104 104 107 108  CO2 25 26 26 25 23 26   GLUCOSE 107* 111* 107* 99 108* 99  BUN 10 8 8 9 15 14   CREATININE 1.27* 1.18 1.21 1.30*  1.54* 1.13  CALCIUM 9.2 9.1 9.8 9.0 8.3* 8.5*  MG 2.3  --   --   --   --   --     GFR: Estimated Creatinine Clearance: 60.3 mL/min (by C-G formula based on SCr of 1.13 mg/dL).  Liver Function Tests: Recent Labs  Lab 12/11/17 0726  AST 19  ALT 11  ALKPHOS 49  BILITOT 0.7  PROT 6.4*  ALBUMIN 3.1*     Recent Results (from the past 240 hour(s))  Surgical PCR screen     Status: None   Collection Time: 12/12/17 11:33 AM  Result Value Ref Range Status   MRSA, PCR NEGATIVE NEGATIVE Final   Staphylococcus aureus NEGATIVE NEGATIVE Final    Comment: (NOTE) The Xpert SA Assay (FDA approved for NASAL specimens in patients 71 years of age and older), is one component of a comprehensive surveillance program. It is not intended to diagnose infection nor to guide or monitor treatment. Performed at [facility], [place] [address] [place], [place] [address]       Radiology Studies: No results found.   Medications:  Scheduled: . acetaminophen  650 mg Oral Q6H  . amLODipine  10 mg Oral Daily  . aspirin EC  81 mg Oral Daily  . carvedilol  3.125 mg Oral BID  . feeding supplement (ENSURE ENLIVE)  237 mL Oral TID BM  . heparin  5,000 Units Subcutaneous Q8H  . hydrALAZINE  25 mg Oral Q8H  . sodium chloride flush  3 mL Intravenous Q12H   Continuous: . sodium chloride    . 0.9 % NaCl with KCl 20 mEq / L 75 mL/hr at 12/17/17 0007   PYP:PJKDTO chloride, morphine injection, ondansetron **OR** ondansetron (ZOFRAN) IV, oxyCODONE, sodium chloride flush  Assessment/Plan:    Obstructing colonic mass status post partial colectomy Preliminary path report suggested adenocarcinoma.  Patient seen by general surgery.  Clearance obtained from cardiology.  Patient underwent partial colectomy with end-to-end anastomosis on 9/18.  Patient remains stable postoperatively.  General surgery continues to follow.  Mobilize.  Await final pathology.    Chronic combined systolic and diastolic CHF EF  is 35 to 40% based on echocardiogram done during this hospitalization.  Seen by cardiology.  Volume status appears to be stable.  Continue with the beta-blocker.  Holding Benicar.  Creatinine is normal today.    Acute renal failure Creatinine peaked at 1.44.  Normal this morning.  Cut back on IV fluids.  Monitor urine output.  History of Vent tachycardia Continue to monitor on telemetry.  Monitor electrolytes closely.  No further recurrence.  Status post TAVR Stable per echocardiogram.  Patient was on dual antiplatelet treatment previously.  Per cardiology okay to discontinue Plavix as he has completed 6 months of DAPT.  Aspirin has been resumed.  Bradycardia Dose of carvedilol was reduced to 3.25 mg twice a day.  Heart rate has improved.  TSH is normal at 0.5.  Essential hypertension Continue to monitor blood pressures closely.  High blood pressure  probably due to pain issues.  Normocytic anemia Mild drop in hemoglobin is likely due to operative loss.  Hemoglobin remains stable.   DVT Prophylaxis: Subcutaneous heparin Code Status: Full code Family Communication: Discussed with the patient Disposition Plan: Continue to mobilize.  Further management per general surgery.    LOS: 7 days   [facility] Pager [phone] 12/17/2017, 9:27 AM  If 7PM-7AM, please contact night-coverage at [email or web address], password [facility]

## 2017-12-17 NOTE — Progress Notes (Signed)
[facility]/Trauma Progress Note  2 Days Post-Op   Assessment/Plan Principal Problem:   Colonic mass Active Problems:   Essential hypertension   Chronic combined systolic and diastolic heart failure (HCC)   Severe aortic stenosis   S/P TAVR (transcatheter aortic valve replacement)  Near obstructing transverse colon mass  - S/P Extended partial colectomy with mobilization of the splenic flexure and creation of side-to-side functional end-to-end anastomosis involving terminal ileum and descending colon, Dr. Brantley Stage, 09/18  FEN: FLD VTE: SCD's, heparin ID: pre-op abx, WBC 8.1, afebrile Foley: DC 09/19 Follow up:  Dr. Brantley Stage  DISPO:  advance diet to fulls. Pt is doing well. Encourage ambulation.    LOS: 7 days    Subjective: CC: abdominal soreness  Pt states his upper abdomen hurts most when he tries to get in and out of bed. Pt had a BM and flatus this am. He denies nausea, vomiting, fever, chills. Tolerating clears. No issues overnight. No family at bedside.   Objective: Vital signs in last 24 hours: Temp:  [98.3 F (36.8 C)-98.5 F (36.9 C)] 98.3 F (36.8 C) (09/20 0402) Pulse Rate:  [62-66] 62 (09/20 0402) Resp:  [17-20] 20 (09/20 0402) BP: (128-167)/(62-83) 167/83 (09/20 0402) SpO2:  [96 %-99 %] 96 % (09/20 0402) Last BM Date: 12/14/17  Intake/Output from previous day: 09/19 0701 - 09/20 0700 In: 1664.2 [P.O.:900; I.V.:764.2] Out: 0  Intake/Output this shift: Total I/O In: -  Out: 500 [Urine:500]  PE: Gen:  Alert, NAD, pleasant, cooperative Pulm: rate and effort normal Abd: Soft, ND, +BS, incisions C/D/I, mild TTP around midline incision, no guarding, no peritonitis Skin: no rashes noted, warm and dry   Anti-infectives: Anti-infectives (From admission, onward)   Start     Dose/Rate Route Frequency Ordered Stop   12/15/17 0900  cefoTEtan (CEFOTAN) 2 g in sodium chloride 0.9 % 100 mL IVPB  Status:  Discontinued     2 g 200 mL/hr over  30 Minutes Intravenous Every 12 hours 12/14/17 0949 12/14/17 1104   12/15/17 0800  cefoTEtan (CEFOTAN) 2 g in sodium chloride 0.9 % 100 mL IVPB     2 g 200 mL/hr over 30 Minutes Intravenous On call to O.R. 12/14/17 1104 12/15/17 0923   12/15/17 0800  clindamycin (CLEOCIN) 900 mg, gentamicin (GARAMYCIN) 240 mg in sodium chloride 0.9 % 1,000 mL for intraperitoneal lavage  Status:  Discontinued      Intraperitoneal To Surgery 12/14/17 1108 12/15/17 1239   12/14/17 1400  neomycin (MYCIFRADIN) tablet 1,000 mg  Status:  Discontinued     1,000 mg Oral 3 times per day on Tue 12/13/17 1903 12/14/17 1045   12/14/17 1400  metroNIDAZOLE (FLAGYL) tablet 1,000 mg  Status:  Discontinued     1,000 mg Oral 3 times per day on Tue 12/13/17 1903 12/14/17 1045   12/14/17 1400  neomycin (MYCIFRADIN) tablet 1,000 mg     1,000 mg Oral 3 times per day 12/14/17 0911 12/14/17 2234   12/14/17 1400  metroNIDAZOLE (FLAGYL) tablet 1,000 mg     1,000 mg Oral 3 times per day 12/14/17 0911 12/14/17 2234   12/14/17 0915  cefoTEtan (CEFOTAN) 2 g in sodium chloride 0.9 % 100 mL IVPB  Status:  Discontinued     2 g 200 mL/hr over 30 Minutes Intravenous On call to O.R. 12/14/17 0911 12/14/17 0956   12/14/17 0900  clindamycin (CLEOCIN) 900 mg, gentamicin (GARAMYCIN) 240 mg in sodium chloride 0.9 % 1,000 mL for intraperitoneal lavage  Status:  Discontinued      Intraperitoneal To Surgery 12/13/17 1634 12/14/17 1108   12/14/17 0855  cefoTEtan (CEFOTAN) 2 g in sodium chloride 0.9 % 100 mL IVPB  Status:  Discontinued     2 g 200 mL/hr over 30 Minutes Intravenous To ShortStay Surgical 12/13/17 1634 12/14/17 0956   12/13/17 1800  neomycin (MYCIFRADIN) tablet 1,000 mg  Status:  Discontinued     1,000 mg Oral 3 times per day on Mon 12/13/17 1634 12/13/17 1905   12/13/17 1800  metroNIDAZOLE (FLAGYL) tablet 1,000 mg  Status:  Discontinued     1,000 mg Oral 3 times per day on Mon 12/13/17 1634 12/13/17 1905      Lab Results:  Recent Labs     12/16/17 0410 12/17/17 0532  WBC 11.5* 8.1  HGB 8.8* 9.0*  HCT 28.5* 29.1*  PLT 133* PLATELET CLUMPS NOTED ON SMEAR, UNABLE TO ESTIMATE   BMET Recent Labs    12/16/17 0410 12/17/17 0532  NA 139 140  K 4.8 4.3  CL 107 108  CO2 23 26  GLUCOSE 108* 99  BUN 15 14  CREATININE 1.54* 1.13  CALCIUM 8.3* 8.5*   PT/INR No results for input(s): LABPROT, INR in the last 72 hours. CMP     Component Value Date/Time   NA 140 12/17/2017 0532   NA 142 01/07/2017 1451   K 4.3 12/17/2017 0532   CL 108 12/17/2017 0532   CO2 26 12/17/2017 0532   GLUCOSE 99 12/17/2017 0532   BUN 14 12/17/2017 0532   BUN 25 01/07/2017 1451   CREATININE 1.13 12/17/2017 0532   CREATININE 1.25 (H) 12/17/2015 0825   CALCIUM 8.5 (L) 12/17/2017 0532   PROT 6.4 (L) 12/11/2017 0726   ALBUMIN 3.1 (L) 12/11/2017 0726   AST 19 12/11/2017 0726   ALT 11 12/11/2017 0726   ALKPHOS 49 12/11/2017 0726   BILITOT 0.7 12/11/2017 0726   GFRNONAA 59 (L) 12/17/2017 0532   GFRAA >60 12/17/2017 0532   Lipase  No results found for: LIPASE  Studies/Results: No results found.    Kalman Drape , [facility] 12/17/2017, 9:03 AM  Pager: [phone] Mon-Wed, Friday 7:00am-4:30pm Thurs 7am-11:30am  Consults: [phone]

## 2017-12-17 NOTE — Progress Notes (Signed)
Telemetry called for 2 runs of vtach and QRS I checked the pt wife at the bedside, pt is sleeping no complain of pain at this time, will continue to monitor.

## 2017-12-18 DIAGNOSIS — C189 Malignant neoplasm of colon, unspecified: Secondary | ICD-10-CM

## 2017-12-18 LAB — BASIC METABOLIC PANEL
ANION GAP: 10 (ref 5–15)
BUN: 11 mg/dL (ref 8–23)
CHLORIDE: 106 mmol/L (ref 98–111)
CO2: 22 mmol/L (ref 22–32)
Calcium: 9.3 mg/dL (ref 8.9–10.3)
Creatinine, Ser: 1.02 mg/dL (ref 0.61–1.24)
Glucose, Bld: 112 mg/dL — ABNORMAL HIGH (ref 70–99)
POTASSIUM: 4.1 mmol/L (ref 3.5–5.1)
SODIUM: 138 mmol/L (ref 135–145)

## 2017-12-18 LAB — CBC
HCT: 33.4 % — ABNORMAL LOW (ref 39.0–52.0)
HEMOGLOBIN: 10.3 g/dL — AB (ref 13.0–17.0)
MCH: 26.8 pg (ref 26.0–34.0)
MCHC: 30.8 g/dL (ref 30.0–36.0)
MCV: 87 fL (ref 78.0–100.0)
PLATELETS: UNDETERMINED 10*3/uL (ref 150–400)
RBC: 3.84 MIL/uL — AB (ref 4.22–5.81)
RDW: 14.4 % (ref 11.5–15.5)
WBC: 7.3 10*3/uL (ref 4.0–10.5)

## 2017-12-18 MED ORDER — CARVEDILOL 3.125 MG PO TABS: 3.1250 mg | ORAL_TABLET | Freq: Two times a day (BID) | ORAL | 1 refills | Status: DC

## 2017-12-18 MED ORDER — IRBESARTAN 300 MG PO TABS
300.0000 mg | ORAL_TABLET | Freq: Every day | ORAL | Status: DC
  Administered 2017-12-18: 300 mg via ORAL
  Filled 2017-12-18: qty 1

## 2017-12-18 MED ORDER — OXYCODONE HCL 5 MG PO TABS: 5.0000 mg | ORAL_TABLET | Freq: Four times a day (QID) | ORAL | 0 refills | Status: DC | PRN

## 2017-12-18 NOTE — Discharge Summary (Signed)
[facility]  Physician Discharge Summary   Patient ID: Robert Horn MRN: [id number] DOB/AGE: 12/19/1933 84 y.o.  Admit date: 12/10/2017 Discharge date: 12/18/2017  PCP: [name], MD  DISCHARGE DIAGNOSES:  Obstructing colonic mass status post partial colectomy Colon cancer Chronic combined systolic and diastolic CHF Acute renal failure, resolved Aortic stenosis status post TAVR Essential hypertension Normocytic anemia  RECOMMENDATIONS FOR OUTPATIENT FOLLOW UP: 1. Outpatient follow-up with general surgery 2. Cardiology follow-up also needed.  Will send message to their office.  DISCHARGE CONDITION: fair  Diet recommendation: As per general surgery  Filed Weights   12/13/17 0452 12/14/17 0500 12/15/17 0452  Weight: 95 kg 95.7 kg 95.1 kg    INITIAL HISTORY: 84 y.o.malewith medical history significant ofhypertension, dyslipidemia status postTAVRSin 12/15/2016 by Dr. [name], systolic and diastolic heart failure with an EF of 45% hypertension that was sent here from the gastroenterologist office Dr. Michail Sermon as he did a colonoscopy which showed a colonic obstructive mass. Patient was seen by general surgery and cardiology.  Cardiology cleared him to undergo surgery.  He underwent partial colectomy with end-to-end anastomosis.  Consultants: General surgery.  Cardiology.  Procedures:  Extended partial colectomy with mobilization of the splenic flexure and creation of side-to-side functional end-to-end anastomosis involving terminal ileum and descending colon  Transthoracic echocardiogram 12/10/17 Study Conclusions  - Left ventricle: The cavity size was normal. Wall thickness was normal. Systolic function was moderately reduced. The estimated ejection fraction was in the range of 35% to 40%. Diffuse hypokinesis. Doppler parameters are consistent with abnormal left ventricular relaxation (grade 1 diastolic dysfunction). No evidence of  thrombus. - Aortic valve: A TAVR stent-valve bioprosthesis was present and functioning normally. Valve area (VTI): 1.31 cm^2. Valve area (Vmax): 1.31 cm^2. Valve area (Vmean): 1.34 cm^2. - Left atrium: The atrium was moderately dilated. - Atrial septum: No defect or patent foramen ovale was identified. Impressions: - LV EF is lower than reported on the last study from October 2018, but direct image comarison shows similar findings. TAVR gradients are higher than in October 2018, but similar to those reported in September 2018.  Pathology report Diagnosis Colon, segmental resection for tumor, Transverse and Ascending - INVASIVE MODERATELY DIFFERENTIATED ADENOCARCINOMA, 4.0 CM, INVOLVING TRANSVERSE COLON - CARCINOMA INVADES INTO PERICOLONIC SOFT TISSUE - SURGICAL RESECTION MARGINS ARE NEGATIVE FOR CARCINOMA - NEGATIVE FOR LYMPHOVASCULAR OR PERINEURAL INVASION - TWENTY FIVE LYMPH NODES, NEGATIVE FOR CARCINOMA (0/25) - SEPARATE TUBULAR ADENOMAS WITHOUT HIGH-GRADE DYSPLASIA   HOSPITAL COURSE:    Obstructing colonic mass status post partial colectomy/colon cancer Patient seen by general surgery.  Clearance obtained from cardiology.  Patient underwent partial colectomy with end-to-end anastomosis on 9/18.  Patient has been stable postoperatively.  Seen by general surgery this morning and cleared for discharge.  Outpatient follow-up with general surgery.  Pathology does show carcinoma.  Will likely need referral to oncology.  Will defer to surgeons.   Chronic combined systolic and diastolic CHF EF is 35 to 63% based on echocardiogram done during this hospitalization.  Seen by cardiology.  Volume status appears to be stable.    Continue with home medications including ARB and beta-blocker.  Acute renal failure Creatinine peaked at 1.44.    Improved with IV hydration.  Now back to baseline.  History of Vent tachycardia Patient with few PVCs and NSVT.  He has been  asymptomatic.  Continue with beta-blocker.  Electrolytes are normal.  Will need to be seen by cardiology as outpatient.  Discussed with Dr. Dorris Carnes with cardiology  who will arrange outpatient follow-up.  May need to be considered for ICD.  Status post TAVR Stable per echocardiogram.  Patient was on dual antiplatelet treatment previously.  Per cardiology okay to discontinue Plavix as he has completed 6 months of DAPT.  Aspirin has been resumed.  Bradycardia Dose of carvedilol was reduced to 3.25 mg twice a day.  Heart rate has improved.  TSH is normal at 0.5.  Essential hypertension Stable.  Occasional high readings noted.  Continue with home medications.  Normocytic anemia Mild drop in hemoglobin is likely due to operative loss.  Hemoglobin remains stable.  Did not require blood transfusion.  Overall stable.  Patient very keen on going home today.  Discharged by general surgery.     PERTINENT LABS:  The results of significant diagnostics from this hospitalization (including imaging, microbiology, ancillary and laboratory) are listed below for reference.    Microbiology: Recent Results (from the past 240 hour(s))  Surgical PCR screen     Status: None   Collection Time: 12/12/17 11:33 AM  Result Value Ref Range Status   MRSA, PCR NEGATIVE NEGATIVE Final   Staphylococcus aureus NEGATIVE NEGATIVE Final    Comment: (NOTE) The Xpert SA Assay (FDA approved for NASAL specimens in patients 26 years of age and older), is one component of a comprehensive surveillance program. It is not intended to diagnose infection nor to guide or monitor treatment. Performed at [facility], [place] [address], [place], [place] [address]      Labs: Basic Metabolic Panel: Recent Labs  Lab 12/12/17 0545  12/14/17 1018 12/15/17 0542 12/16/17 0410 12/17/17 0532 12/18/17 0719  NA 141   < > 139 138 139 140 138  K 3.8   < > 4.0 3.9 4.8 4.3 4.1  CL 106   < > 104 104 107 108 106  CO2 25    < > 26 25 23 26 22   GLUCOSE 107*   < > 107* 99 108* 99 112*  BUN 10   < > 8 9 15 14 11   CREATININE 1.27*   < > 1.21 1.30* 1.54* 1.13 1.02  CALCIUM 9.2   < > 9.8 9.0 8.3* 8.5* 9.3  MG 2.3  --   --   --   --   --   --    < > = values in this interval not displayed.   CBC: Recent Labs  Lab 12/13/17 0630 12/15/17 0542 12/16/17 0410 12/17/17 0532 12/18/17 0719  WBC 4.3 4.7 11.5* 8.1 7.3  HGB 9.1* 9.6* 8.8* 9.0* 10.3*  HCT 29.1* 30.1* 28.5* 29.1* 33.4*  MCV 87.1 86.2 87.4 88.4 87.0  PLT 134* 157 133* PLATELET CLUMPS NOTED ON SMEAR, UNABLE TO ESTIMATE PLATELET CLUMPS NOTED ON SMEAR, UNABLE TO ESTIMATE     IMAGING STUDIES Ct Abdomen Pelvis W Wo Contrast  Result Date: 12/10/2017 CLINICAL DATA:  New diagnosis of colon cancer on colonoscopy. EXAM: CT CHEST WITH CONTRAST CT ABDOMEN AND PELVIS WITH AND WITHOUT CONTRAST TECHNIQUE: Multidetector CT imaging of the chest was performed during intravenous contrast administration. Multidetector CT imaging of the abdomen and pelvis was performed following the standard protocol before and during bolus administration of intravenous contrast. CONTRAST:  139mL OMNIPAQUE IOHEXOL 300 MG/ML  SOLN COMPARISON:  Chest radiograph 01/07/2017. CTAs 11/13/2016. No prior abdominopelvic CT. FINDINGS: CT CHEST FINDINGS Cardiovascular: Aortic and branch vessel atherosclerosis. Status post aortic valve repair. Normal heart size, without pericardial effusion. No central pulmonary embolism, on this non-dedicated study. Mediastinum/Nodes:  No supraclavicular adenopathy. No mediastinal or hilar adenopathy. Lungs/Pleura: No pleural fluid.  Mild centrilobular emphysema. 3 mm right upper lobe pulmonary nodule on image 53/10 is unchanged compared to the prior, favoring a benign etiology. Right upper lobe scarring. A 4 mm left apical nodule is also unchanged. Musculoskeletal: No acute osseous abnormality. Remote left clavicular fracture. CT ABDOMEN AND PELVIS FINDINGS Hepatobiliary: Too  small to characterize 3 mm lateral segment left liver lobe lesion. No suspicious liver lesion. Normal gallbladder, without biliary ductal dilatation. Pancreas: Mild pancreatic atrophy is likely within normal variation for age. Spleen: Normal in size, without focal abnormality. Adrenals/Urinary Tract: Normal adrenal glands. Mild renal cortical thinning bilaterally. Favor right renal vascular calcification. Bilateral renal cysts. Normal urinary bladder. Stomach/Bowel: Normal stomach, without wall thickening. Scattered colonic diverticula. No colonic obstruction identified. There is underdistention of and suggestion of wall thickening within the distal transverse colon, including on image 65/8. Normal terminal ileum and appendix. Normal small bowel. Vascular/Lymphatic: Aortic and branch vessel atherosclerosis. Multiple left renal arteries. No abdominal adenopathy. A small node within the left-sided transverse mesocolon at 8 mm on image 57/8. No pelvic sidewall adenopathy. Reproductive: Moderate prostatomegaly with median lobe impression into the urinary bladder. Other: No significant free fluid. No evidence of omental or peritoneal disease. Musculoskeletal: Degenerative partial fusion of the bilateral sacroiliac joints. Moderate lumbosacral spondylosis. IMPRESSION: 1. No obstructive colonic mass identified. There is underdistention and possible wall thickening in the distal transverse colon, which could represent the primary. An adjacent 8 mm node in the transverse mesocolon is indeterminate. 2. Otherwise, no evidence of metastatic disease in the chest, abdomen, or pelvis. 3. Bilateral pulmonary nodules, similar to 11/13/2016. Stability favors a benign etiology. 4. Aortic atherosclerosis (ICD10-I70.0), coronary artery atherosclerosis and emphysema (ICD10-J43.9). Electronically Signed   By: Abigail Miyamoto M.D.   On: 12/10/2017 22:04   Ct Chest W Contrast  Result Date: 12/10/2017 CLINICAL DATA:  New diagnosis of colon  cancer on colonoscopy. EXAM: CT CHEST WITH CONTRAST CT ABDOMEN AND PELVIS WITH AND WITHOUT CONTRAST TECHNIQUE: Multidetector CT imaging of the chest was performed during intravenous contrast administration. Multidetector CT imaging of the abdomen and pelvis was performed following the standard protocol before and during bolus administration of intravenous contrast. CONTRAST:  166mL OMNIPAQUE IOHEXOL 300 MG/ML  SOLN COMPARISON:  Chest radiograph 01/07/2017. CTAs 11/13/2016. No prior abdominopelvic CT. FINDINGS: CT CHEST FINDINGS Cardiovascular: Aortic and branch vessel atherosclerosis. Status post aortic valve repair. Normal heart size, without pericardial effusion. No central pulmonary embolism, on this non-dedicated study. Mediastinum/Nodes: No supraclavicular adenopathy. No mediastinal or hilar adenopathy. Lungs/Pleura: No pleural fluid.  Mild centrilobular emphysema. 3 mm right upper lobe pulmonary nodule on image 53/10 is unchanged compared to the prior, favoring a benign etiology. Right upper lobe scarring. A 4 mm left apical nodule is also unchanged. Musculoskeletal: No acute osseous abnormality. Remote left clavicular fracture. CT ABDOMEN AND PELVIS FINDINGS Hepatobiliary: Too small to characterize 3 mm lateral segment left liver lobe lesion. No suspicious liver lesion. Normal gallbladder, without biliary ductal dilatation. Pancreas: Mild pancreatic atrophy is likely within normal variation for age. Spleen: Normal in size, without focal abnormality. Adrenals/Urinary Tract: Normal adrenal glands. Mild renal cortical thinning bilaterally. Favor right renal vascular calcification. Bilateral renal cysts. Normal urinary bladder. Stomach/Bowel: Normal stomach, without wall thickening. Scattered colonic diverticula. No colonic obstruction identified. There is underdistention of and suggestion of wall thickening within the distal transverse colon, including on image 65/8. Normal terminal ileum and appendix. Normal  small bowel. Vascular/Lymphatic:  Aortic and branch vessel atherosclerosis. Multiple left renal arteries. No abdominal adenopathy. A small node within the left-sided transverse mesocolon at 8 mm on image 57/8. No pelvic sidewall adenopathy. Reproductive: Moderate prostatomegaly with median lobe impression into the urinary bladder. Other: No significant free fluid. No evidence of omental or peritoneal disease. Musculoskeletal: Degenerative partial fusion of the bilateral sacroiliac joints. Moderate lumbosacral spondylosis. IMPRESSION: 1. No obstructive colonic mass identified. There is underdistention and possible wall thickening in the distal transverse colon, which could represent the primary. An adjacent 8 mm node in the transverse mesocolon is indeterminate. 2. Otherwise, no evidence of metastatic disease in the chest, abdomen, or pelvis. 3. Bilateral pulmonary nodules, similar to 11/13/2016. Stability favors a benign etiology. 4. Aortic atherosclerosis (ICD10-I70.0), coronary artery atherosclerosis and emphysema (ICD10-J43.9). Electronically Signed   By: Abigail Miyamoto M.D.   On: 12/10/2017 22:04    DISCHARGE EXAMINATION: Vitals:   12/17/17 1603 12/17/17 2123 12/18/17 0515 12/18/17 1153  BP: (!) 168/55 (!) 141/69 (!) 172/95 (!) 142/91  Pulse: 62 (!) 59 63   Resp: 18 18 16    Temp: 97.6 F (36.4 C) 98.2 F (36.8 C) 98.6 F (37 C)   TempSrc: Oral Oral Oral   SpO2: 98% 98% 99%   Weight:      Height:       General appearance: alert, cooperative, appears stated age and no distress Resp: clear to auscultation bilaterally Cardio: regular rate and rhythm, S1, S2 normal, no murmur, click, rub or gallop GI: Abdomen is soft.  Dressing noted.  All sounds present.  DISPOSITION: Home  Discharge Instructions    Call MD for:  difficulty breathing, headache or visual disturbances   Complete by:  As directed    Call MD for:  extreme fatigue   Complete by:  As directed    Call MD for:  hives   Complete  by:  As directed    Call MD for:  persistant dizziness or light-headedness   Complete by:  As directed    Call MD for:  persistant nausea and vomiting   Complete by:  As directed    Call MD for:  redness, tenderness, or signs of infection (pain, swelling, redness, odor or green/yellow discharge around incision site)   Complete by:  As directed    Call MD for:  severe uncontrolled pain   Complete by:  As directed    Call MD for:  temperature >100.4   Complete by:  As directed    Diet - low sodium heart healthy   Complete by:  As directed    Discharge instructions   Complete by:  As directed    May shower. Diet as tolerated. No heavy lifting   Increase activity slowly   Complete by:  As directed    No wound care   Complete by:  As directed          Allergies as of 12/18/2017   No Known Allergies     Medication List    STOP taking these medications   clopidogrel 75 MG tablet Commonly known as:  PLAVIX     TAKE these medications   amLODipine 10 MG tablet Commonly known as:  NORVASC TAKE 1 TABLET BY MOUTH EVERY DAY   amoxicillin 500 MG tablet Commonly known as:  AMOXIL Take 1 tablet (500 mg total) by mouth as directed. 4 TABS 1 HOUR BEFORE DENTAL WORK   aspirin EC 81 MG EC tablet Generic drug:  aspirin Take 81 mg  by mouth daily. Swallow whole.   carvedilol 3.125 MG tablet Commonly known as:  COREG Take 1 tablet (3.125 mg total) by mouth 2 (two) times daily. What changed:    medication strength  how much to take   furosemide 40 MG tablet Commonly known as:  LASIX Take 1 tablet (40 mg total) by mouth daily.   hydrALAZINE 25 MG tablet Commonly known as:  APRESOLINE TAKE 1 TABLET BY MOUTH EVERY 8 HOURS   multivitamin with minerals Tabs tablet Take 1 tablet by mouth daily.   olmesartan 40 MG tablet Commonly known as:  BENICAR Take 1 tablet (40 mg total) by mouth daily.   omega-3 acid ethyl esters 1 g capsule Commonly known as:  LOVAZA TAKE ONE CAPSULE  BY MOUTH EVERY DAY   oxyCODONE 5 MG immediate release tablet Commonly known as:  Oxy IR/ROXICODONE Take 1-2 tablets (5-10 mg total) by mouth every 6 (six) hours as needed for moderate pain, severe pain or breakthrough pain.   VITAMIN B COMPLEX-C PO Take 1 tablet by mouth daily.        Follow-up Information    Eileen Stanford, PA-C. Go on 02/03/2018.   Specialties:  Cardiology, Radiology Why:  @ 1:30pm  Contact information: [place] [id number] [phone]        Erroll Luna, MD. Go on 12/31/2017.   Specialty:  General Surgery Why:  10/4 at 9:10 am.  Contact information: [place] [address] [phone]        [facility], [place]. Go on 12/27/2017.   Specialty:  General Surgery Why:  Staple removal on 9/30 at 10:30 am. please arrive 30 minutes prior to complete paperwork. Please bring photo ID and insurance card Contact information: [address] [place]: 35 mins  [facility] Pager [phone]  12/18/2017, 12:15 PM

## 2017-12-18 NOTE — Progress Notes (Signed)
CCMD called informing on pt's 8 runs of Vtach on telemetry.  Checked patient who denies of any pain except some discomfort on abdomen from surgical site with movement. Will monitor.

## 2017-12-18 NOTE — Progress Notes (Signed)
3 Days Post-Op   Subjective/Chief Complaint: No complaints. Wants to go home today   Objective: Vital signs in last 24 hours: Temp:  [97.6 F (36.4 C)-98.6 F (37 C)] 98.6 F (37 C) (09/21 0515) Pulse Rate:  [59-63] 63 (09/21 0515) Resp:  [16-18] 16 (09/21 0515) BP: (141-172)/(55-95) 172/95 (09/21 0515) SpO2:  [98 %-99 %] 99 % (09/21 0515) Last BM Date: 12/17/17  Intake/Output from previous day: 09/20 0701 - 09/21 0700 In: 1953.9 [P.O.:567; I.V.:1386.9] Out: 500 [Urine:500] Intake/Output this shift: No intake/output data recorded.  General appearance: alert and cooperative Resp: clear to auscultation bilaterally Cardio: regular rate and rhythm GI: soft, nontender. good bs. incisions look good  Lab Results:  Recent Labs    12/16/17 0410 12/17/17 0532  WBC 11.5* 8.1  HGB 8.8* 9.0*  HCT 28.5* 29.1*  PLT 133* PLATELET CLUMPS NOTED ON SMEAR, UNABLE TO ESTIMATE   BMET Recent Labs    12/16/17 0410 12/17/17 0532  NA 139 140  K 4.8 4.3  CL 107 108  CO2 23 26  GLUCOSE 108* 99  BUN 15 14  CREATININE 1.54* 1.13  CALCIUM 8.3* 8.5*   PT/INR No results for input(s): LABPROT, INR in the last 72 hours. ABG No results for input(s): PHART, HCO3 in the last 72 hours.  Invalid input(s): PCO2, PO2  Studies/Results: No results found.  Anti-infectives: Anti-infectives (From admission, onward)   Start     Dose/Rate Route Frequency Ordered Stop   12/15/17 0900  cefoTEtan (CEFOTAN) 2 g in sodium chloride 0.9 % 100 mL IVPB  Status:  Discontinued     2 g 200 mL/hr over 30 Minutes Intravenous Every 12 hours 12/14/17 0949 12/14/17 1104   12/15/17 0800  cefoTEtan (CEFOTAN) 2 g in sodium chloride 0.9 % 100 mL IVPB     2 g 200 mL/hr over 30 Minutes Intravenous On call to O.R. 12/14/17 1104 12/15/17 0923   12/15/17 0800  clindamycin (CLEOCIN) 900 mg, gentamicin (GARAMYCIN) 240 mg in sodium chloride 0.9 % 1,000 mL for intraperitoneal lavage  Status:  Discontinued     Intraperitoneal To Surgery 12/14/17 1108 12/15/17 1239   12/14/17 1400  neomycin (MYCIFRADIN) tablet 1,000 mg  Status:  Discontinued     1,000 mg Oral 3 times per day on Tue 12/13/17 1903 12/14/17 1045   12/14/17 1400  metroNIDAZOLE (FLAGYL) tablet 1,000 mg  Status:  Discontinued     1,000 mg Oral 3 times per day on Tue 12/13/17 1903 12/14/17 1045   12/14/17 1400  neomycin (MYCIFRADIN) tablet 1,000 mg     1,000 mg Oral 3 times per day 12/14/17 0911 12/14/17 2234   12/14/17 1400  metroNIDAZOLE (FLAGYL) tablet 1,000 mg     1,000 mg Oral 3 times per day 12/14/17 0911 12/14/17 2234   12/14/17 0915  cefoTEtan (CEFOTAN) 2 g in sodium chloride 0.9 % 100 mL IVPB  Status:  Discontinued     2 g 200 mL/hr over 30 Minutes Intravenous On call to O.R. 12/14/17 0911 12/14/17 0956   12/14/17 0900  clindamycin (CLEOCIN) 900 mg, gentamicin (GARAMYCIN) 240 mg in sodium chloride 0.9 % 1,000 mL for intraperitoneal lavage  Status:  Discontinued      Intraperitoneal To Surgery 12/13/17 1634 12/14/17 1108   12/14/17 0855  cefoTEtan (CEFOTAN) 2 g in sodium chloride 0.9 % 100 mL IVPB  Status:  Discontinued     2 g 200 mL/hr over 30 Minutes Intravenous To ShortStay Surgical 12/13/17 1634 12/14/17 0956  12/13/17 1800  neomycin (MYCIFRADIN) tablet 1,000 mg  Status:  Discontinued     1,000 mg Oral 3 times per day on Mon 12/13/17 1634 12/13/17 1905   12/13/17 1800  metroNIDAZOLE (FLAGYL) tablet 1,000 mg  Status:  Discontinued     1,000 mg Oral 3 times per day on Mon 12/13/17 1634 12/13/17 1905      Assessment/Plan: s/p Procedure(s): LAPAROSCOPIC PARTIAL COLECTOMY (N/A) Advance diet Discharge  POD 3  LOS: 8 days    TOTH III,PAUL S 12/18/2017

## 2017-12-18 NOTE — Progress Notes (Signed)
Discharged home today accompanied by patient's daughter. Personal belongings, discharged instructions, prescription given to patient and wife. Verbalized understanding of instructions

## 2017-12-23 ENCOUNTER — Other Ambulatory Visit: Payer: Medicare Other

## 2017-12-23 ENCOUNTER — Other Ambulatory Visit (HOSPITAL_COMMUNITY): Payer: Medicare Other

## 2017-12-23 ENCOUNTER — Ambulatory Visit: Payer: Medicare Other | Admitting: Physician Assistant

## 2018-01-07 ENCOUNTER — Telehealth: Payer: Self-pay | Admitting: Oncology

## 2018-01-07 ENCOUNTER — Encounter: Payer: Self-pay | Admitting: Oncology

## 2018-01-07 NOTE — Telephone Encounter (Signed)
New referral received from Dr. Brantley Stage for colon cancer. Pt has been scheduled to see Dr. Benay Spice on 10/22 at 2pm. Appt date and time given to the pt's wife who has agreed to the appt date and time.

## 2018-01-18 ENCOUNTER — Encounter: Payer: Self-pay | Admitting: Oncology

## 2018-01-18 ENCOUNTER — Inpatient Hospital Stay: Payer: Medicare Other | Attending: Oncology | Admitting: Oncology

## 2018-01-18 ENCOUNTER — Telehealth: Payer: Self-pay | Admitting: Oncology

## 2018-01-18 VITALS — BP 130/75 | HR 70 | Temp 98.0°F | Resp 18 | Ht 72.0 in | Wt 213.2 lb

## 2018-01-18 DIAGNOSIS — I10 Essential (primary) hypertension: Secondary | ICD-10-CM

## 2018-01-18 DIAGNOSIS — Z9049 Acquired absence of other specified parts of digestive tract: Secondary | ICD-10-CM | POA: Diagnosis not present

## 2018-01-18 DIAGNOSIS — R972 Elevated prostate specific antigen [PSA]: Secondary | ICD-10-CM

## 2018-01-18 DIAGNOSIS — Z86010 Personal history of colonic polyps: Secondary | ICD-10-CM

## 2018-01-18 DIAGNOSIS — N400 Enlarged prostate without lower urinary tract symptoms: Secondary | ICD-10-CM | POA: Diagnosis not present

## 2018-01-18 DIAGNOSIS — C184 Malignant neoplasm of transverse colon: Secondary | ICD-10-CM | POA: Diagnosis not present

## 2018-01-18 NOTE — Progress Notes (Signed)
[facility] Patient Consult   Requesting MD: Omid Deardorff 84 y.o.  31-Aug-1933    Reason for Consult: Colon cancer   HPI: Robert Horn reports being diagnosed with anemia when he saw Dr. Delfina Redwood for routine evaluation.  He was referred to Dr. Michail Sermon and was taken to a colonoscopy on 12/09/2017.  The bowel prep was inadequate.  A fungating mass was found in the transverse colon at 75 cm from the anus.  Oozing was present.  A biopsy was obtained.  Area was tattooed.  Sessile polyps were found in the descending colon.  The polyps were not removed.  The biopsy from the transverse colon revealed invasive well-differentiated adenocarcinoma. CTs of the chest, abdomen, and pelvis on 12/10/2017 a 3 mm right upper lobe nodule and a 4 mm left apical nodule were unchanged compared to previous CTs.  Too small to characterize 3 mm left liver lesion.  No colonic obstruction identified.  Wall thickening in the distal transverse colon.  No abdominal adenopathy.  An 8 mm node was seen in the left transverse mesocolon.  Moderate prostatomegaly.  Dr. Brantley Stage was consulted and he was taken to the operating room on 12/15/2017.  The tattoo was identified in the distal transverse colon.  A large chronic serosal tear was noted in the cecum without perforation.  No evidence of metastatic disease.  The mid descending colon and terminal ileum were divided.  No palpable lymphadenopathy.  The pathology ([id number]) revealed an invasive moderately differentiated adenocarcinoma of the transverse colon.  Tumor invaded into pericolonic soft tissue.  The resection margins are negative.  No lymphovascular or perineural invasion.  25 lymph nodes are negative for metastatic carcinoma.  There were separate tubular adenomas without high-grade dysplasia.  No macroscopic tumor perforation.  No tumor deposit.  The tumor returned MSI stable with no loss of mismatch repair protein expression.  Robert Horn has  recovered from surgery.  He is referred for oncology evaluation. Past Medical History:  Diagnosis Date  . CHF (congestive heart failure) ([place])   . Dyslipidemia 10/27/2015  . Dyspnea    w/ exertion   . Elevated PSA 10/27/2015  . Erectile dysfunction 10/27/2015  . Heart murmur   . Hypertension 10/27/2015  . Hypogonadism male 10/27/2015  . Obesity 10/27/2015  . Pneumonia 10/27/2015   pt states was 1982  . Rotator cuff tear 10/27/2015    .   Colon cancer-transverse colon, T3N0                                                                            12/15/2017  Past Surgical History:  Procedure Laterality Date  . CARDIAC CATHETERIZATION N/A 11/14/2015   Procedure: Left Heart Cath and Coronary Angiography;  Surgeon: [name], MD;  Location: [facility];  Service: Cardiovascular;  Laterality: N/A;  . LAPAROSCOPIC PARTIAL COLECTOMY  12/15/2017   LAPAROSCOPIC PARTIAL COLECTOMY (N/A Abdomen)  . LAPAROSCOPIC PARTIAL COLECTOMY N/A 12/15/2017   Procedure: LAPAROSCOPIC PARTIAL COLECTOMY;  Surgeon: Erroll Luna, MD;  Location: [facility];  Service: General;  Laterality: N/A;  . TEE WITHOUT CARDIOVERSION N/A 12/15/2016   Procedure: TRANSESOPHAGEAL ECHOCARDIOGRAM (TEE);  Surgeon: Sherren Mocha, MD;  Location:  [facility];  Service: Open Heart Surgery;  Laterality: N/A;  . TRANSCATHETER AORTIC VALVE REPLACEMENT, TRANSFEMORAL N/A 12/15/2016   Procedure: TRANSCATHETER AORTIC VALVE REPLACEMENT, TRANSFEMORAL;  Surgeon: Sherren Mocha, MD;  Location: [facility];  Service: Open Heart Surgery;  Laterality: N/A;    Medications: Reviewed  Allergies: No Known Allergies  Family history: A aunt had "cancer ".  No other family history of cancer  Social History:   He lives with his wife in [place].  He is a Psychiatrist.  He quit smoking cigarettes 40 years ago.  Rare alcohol use.  No risk factor for HIV or hepatitis.  ROS:   Positives include: Urinary urgency, left arm pruritus following  surgery, intermittent "stinging" discomfort in the right abdomen following surgery  A complete ROS was otherwise negative.  Physical Exam:  Blood pressure 130/75, pulse 70, temperature 98 F (36.7 C), temperature source Oral, resp. rate 18, height 6' (1.829 m), weight 213 lb 3.2 oz (96.7 kg), SpO2 99 %.  HEENT: Oropharynx without visible mass, neck without mass Lungs: Clear bilaterally, no respiratory distress Cardiac: Regular rhythm with an occasional pause Abdomen: Healed surgical incisions, no hepatosplenomegaly, no mass, nontender GU: Testes without mass Vascular: No leg edema Lymph nodes: No cervical, supraclavicular, axillary, or inguinal nodes Neurologic: Alert and oriented, the motor exam appears intact in the upper and lower extremities Musculoskeletal: No spine tenderness   LAB:  CBC  Lab Results  Component Value Date   WBC 7.3 12/18/2017   HGB 10.3 (L) 12/18/2017   HCT 33.4 (L) 12/18/2017   MCV 87.0 12/18/2017   PLT PLATELET CLUMPS NOTED ON SMEAR, UNABLE TO ESTIMATE 12/18/2017   NEUTROABS 2,310 11/11/2015        CMP  Lab Results  Component Value Date   NA 138 12/18/2017   K 4.1 12/18/2017   CL 106 12/18/2017   CO2 22 12/18/2017   GLUCOSE 112 (H) 12/18/2017   BUN 11 12/18/2017   CREATININE 1.02 12/18/2017   CALCIUM 9.3 12/18/2017   PROT 6.4 (L) 12/11/2017   ALBUMIN 3.1 (L) 12/11/2017   AST 19 12/11/2017   ALT 11 12/11/2017   ALKPHOS 49 12/11/2017   BILITOT 0.7 12/11/2017   GFRNONAA >60 12/18/2017   GFRAA >60 12/18/2017     Lab Results  Component Value Date   CEA1 3.1 12/10/2017    Imaging:  As per HPI, CT images from 12/10/2017 reviewed   Assessment/Plan:   1. Colon cancer, transverse, stage II (T3N0), status post a right and transverse colectomy 12/15/2017  0/25 lymph nodes positive, no lymphovascular or perineural invasion  MSI stable, no loss of mismatch repair protein expression  CTs 12/10/2017- transverse colon wall thickening, 8  mm transverse mesocolon lymph node, no evidence of metastatic disease, stable small bilateral pulmonary nodules  Colonoscopy 12/09/2017-transverse colon mass, sessile polyps in the descending colon-not removed 2. Polyps on the 12/15/2017 resection specimen- tubular adenomas 3. Enlarged prostate with elevated PSA 4. Status post TAVR 12/15/2016 5. Hypertension 6. Pneumonia 1982 7. History of CHF   Disposition:   Robert Horn has been diagnosed with adenocarcinoma of the transverse colon.  He underwent a right and extended transverse colectomy 12/15/2017.  He was diagnosed with stage II colon cancer.  I discussed the details of the surgical pathology report with Robert Horn and his wife.  He has a good prognosis for a long-term disease-free survival.  His tumor does not have "high risk "features.  I do not recommend adjuvant systemic therapy.  He will schedule a one-year surveillance colonoscopy with Dr. Michail Sermon.  He will return for an office visit and CEA in 6 months.  Robert Horn does not appear to have hereditary non-polyposis colon cancer syndrome, but his family members are at increased risk of developing colorectal cancer and should receive appropriate screening.  We discussed diet and exercise maneuvers that may decrease the risk of developing colon cancer.    Betsy Coder, MD  01/18/2018, 2:36 PM

## 2018-01-18 NOTE — Telephone Encounter (Signed)
Scheduled appt per 10/22 los - gave patient AVS and calender per los.   

## 2018-01-25 DIAGNOSIS — Z952 Presence of prosthetic heart valve: Secondary | ICD-10-CM | POA: Diagnosis not present

## 2018-01-25 DIAGNOSIS — I10 Essential (primary) hypertension: Secondary | ICD-10-CM | POA: Diagnosis not present

## 2018-01-25 DIAGNOSIS — C184 Malignant neoplasm of transverse colon: Secondary | ICD-10-CM | POA: Diagnosis not present

## 2018-01-25 DIAGNOSIS — E7800 Pure hypercholesterolemia, unspecified: Secondary | ICD-10-CM | POA: Diagnosis not present

## 2018-01-25 DIAGNOSIS — I5040 Unspecified combined systolic (congestive) and diastolic (congestive) heart failure: Secondary | ICD-10-CM | POA: Diagnosis not present

## 2018-02-01 NOTE — Progress Notes (Signed)
[facility]                                       Cardiology Office Note    Date:  02/03/2018   ID:  Robert Horn, DOB 1933-11-15, MRN [id number]  PCP:  [name], MD  Cardiologist:  Dr. [name] / Dr. [name] & Dr. Cyndia Bent (TAVR)  CC: 1 year s/p TAVR   History of Present Illness:  Robert Horn is a 84 y.o. male with a history of chronic combined S/D CHF, HTN, HLD, colon cancer and severe aortic stenosis s/p TAVR (12/15/16) who presents to clinic for 1 year follow up.   Robert Horn has been followed for aortic stenosis for several years. Over the past few years he has developed progressive LV systolic dysfunction and findings suggestive of progressive and then severe aortic stenosis based on echo criteria.  He underwent successful TAVR with a 28mm Edwards Sapien 3 THV via TF approach on 12/15/16. Post operative echo showed a well seated valve with no PVL; mean gradient 20 mmHg. He was discharged on ASA and plavix.    1 month echo showed improvement in LV function to 45-50% s/p TAVR and mean gradient of 12 mmHg.   He was admitted from 9/13-9/21/19 for newly diagnosed colon cancer with an obstructing colonic mass s/p partial colectomy. Echo during that admission showed EF 35-40%, normally functioning TAVR with mean gradient 19 mm Hg. TAVR gradients were higher than in 12/2016, but similar to those reported in 11/2016. He was also due for a chest CT to follow up on pulmonary nodules. This was completed in the hospital and showed stable pulmonary nodules, favoring a benign process.   Today he presents to clinic for follow up. Doing well with no complaints. No CP or SOB. No LE edema, orthopnea or PND. No dizziness or syncope. No blood in stool or urine. No palpitations. He still gets some mild shortness of breath with moderate exertion like walking up an incline like a flight of stairs.    Past Medical History:  Diagnosis Date  . CHF  (congestive heart failure) ([place])   . Dyslipidemia 10/27/2015  . Dyspnea    w/ exertion   . Elevated PSA 10/27/2015  . Erectile dysfunction 10/27/2015  . Heart murmur   . Hypertension 10/27/2015  . Hypogonadism male 10/27/2015  . Obesity 10/27/2015  . Pneumonia 10/27/2015   pt states was 1982  . Rotator cuff tear 10/27/2015    Past Surgical History:  Procedure Laterality Date  . CARDIAC CATHETERIZATION N/A 11/14/2015   Procedure: Left Heart Cath and Coronary Angiography;  Surgeon: [name], MD;  Location: [facility];  Service: Cardiovascular;  Laterality: N/A;  . LAPAROSCOPIC PARTIAL COLECTOMY  12/15/2017   LAPAROSCOPIC PARTIAL COLECTOMY (N/A Abdomen)  . LAPAROSCOPIC PARTIAL COLECTOMY N/A 12/15/2017   Procedure: LAPAROSCOPIC PARTIAL COLECTOMY;  Surgeon: Erroll Luna, MD;  Location: [facility];  Service: General;  Laterality: N/A;  . TEE WITHOUT CARDIOVERSION N/A 12/15/2016   Procedure: TRANSESOPHAGEAL ECHOCARDIOGRAM (TEE);  Surgeon: Sherren Mocha, MD;  Location: [facility];  Service: Open Heart Surgery;  Laterality: N/A;  . TRANSCATHETER AORTIC VALVE REPLACEMENT, TRANSFEMORAL N/A 12/15/2016   Procedure: TRANSCATHETER AORTIC VALVE REPLACEMENT, TRANSFEMORAL;  Surgeon: Sherren Mocha, MD;  Location: [facility];  Service: Open Heart Surgery;  Laterality: N/A;    Current Medications: Outpatient Medications  Prior to Visit  Medication Sig Dispense Refill  . aspirin (ASPIRIN EC) 81 MG EC tablet Take 81 mg by mouth daily. Swallow whole.    . carvedilol (COREG) 3.125 MG tablet Take 1 tablet (3.125 mg total) by mouth 2 (two) times daily. 60 tablet 1  . furosemide (LASIX) 40 MG tablet Take 1 tablet (40 mg total) by mouth daily. 90 tablet 3  . hydrALAZINE (APRESOLINE) 25 MG tablet TAKE 1 TABLET BY MOUTH EVERY 8 HOURS 90 tablet 5  . Multiple Vitamin (MULTIVITAMIN WITH MINERALS) TABS tablet Take 1 tablet by mouth daily. 30 tablet 1  . omega-3 acid ethyl esters (LOVAZA) 1 g capsule TAKE ONE  CAPSULE BY MOUTH EVERY DAY 30 capsule 5  . VITAMIN B COMPLEX-C PO Take 1 tablet by mouth daily.    Marland Kitchen amLODipine (NORVASC) 10 MG tablet TAKE 1 TABLET BY MOUTH EVERY DAY 30 tablet 6  . oxyCODONE (OXY IR/ROXICODONE) 5 MG immediate release tablet Take 1-2 tablets (5-10 mg total) by mouth every 6 (six) hours as needed for moderate pain, severe pain or breakthrough pain. 15 tablet 0   No facility-administered medications prior to visit.      Allergies:   Patient has no known allergies.   Social History   Socioeconomic History  . Marital status: Single    Spouse name: Not on file  . Number of children: Not on file  . Years of education: Not on file  . Highest education level: Not on file  Occupational History  . Not on file  Social Needs  . Financial resource strain: Not on file  . Food insecurity:    Worry: Not on file    Inability: Not on file  . Transportation needs:    Medical: Not on file    Non-medical: Not on file  Tobacco Use  . Smoking status: Former Smoker    Types: Cigarettes  . Smokeless tobacco: Never Used  Substance and Sexual Activity  . Alcohol use: Yes    Comment: rarely  . Drug use: No  . Sexual activity: Not on file  Lifestyle  . Physical activity:    Days per week: Not on file    Minutes per session: Not on file  . Stress: Not on file  Relationships  . Social connections:    Talks on phone: Not on file    Gets together: Not on file    Attends religious service: Not on file    Active member of club or organization: Not on file    Attends meetings of clubs or organizations: Not on file    Relationship status: Not on file  Other Topics Concern  . Not on file  Social History Narrative  . Not on file     Family History:  The patient's family history includes Diabetes in his mother; Heart disease in his mother; Pulmonary embolism in his father.      ROS:   Please see the history of present illness.    ROS All other systems reviewed and are  negative.   PHYSICAL EXAM:   VS:  BP (!) 160/78 (BP Location: Left Arm, Patient Position: Sitting, Cuff Size: Normal)   Pulse 68   Ht 6' (1.829 m)   Wt 220 lb 12.8 oz (100.2 kg)   SpO2 100% Comment: at rest  BMI 29.95 kg/m    GEN: Well nourished, well developed, in no acute distress HEENT: normal Neck: no JVD or masses Cardiac: RRR; 2/6 SEM. No rubs,  or gallops,no edema  Respiratory:  clear to auscultation bilaterally, normal work of breathing GI: soft, nontender, nondistended, + BS MS: no deformity or atrophy Skin: warm and dry, no rash Neuro:  Alert and Oriented x 3, Strength and sensation are intact Psych: euthymic mood, full affect   Wt Readings from Last 3 Encounters:  02/03/18 220 lb 12.8 oz (100.2 kg)  01/18/18 213 lb 3.2 oz (96.7 kg)  12/15/17 209 lb 11.2 oz (95.1 kg)      Studies/Labs Reviewed:   EKG:  EKG is NOT ordered today.    Recent Labs: 12/11/2017: ALT 11 12/12/2017: Magnesium 2.3 12/16/2017: TSH 0.505 12/18/2017: BUN 11; Creatinine, Ser 1.02; Hemoglobin 10.3; Platelets PLATELET CLUMPS NOTED ON SMEAR, UNABLE TO ESTIMATE; Potassium 4.1; Sodium 138   Lipid Panel No results found for: CHOL, TRIG, HDL, CHOLHDL, VLDL, LDLCALC, LDLDIRECT  Additional studies/ records that were reviewed today include:  TAVR OPERATIVE NOTE  Date of Procedure:12/15/2016 Procedure:   Transcatheter Aortic Valve Replacement - Percutaneous Transfemoral Approach Edwards Sapien 3 THV (size 18mm, model # 9600TFX, serial # [id number])  Pre-operative Echo Findings: ? Severe aortic stenosis ? Moderately depressed left ventricular systolic function  Post-operative Echo Findings: ? Noparavalvular leak ? unchangedleft ventricular systolic function  _____________   2D ECHO: 01/07/17  Study Conclusions - Left ventricle: The cavity size was mildly dilated. Systolic function was mildly reduced. The estimated ejection fraction was in the  range of 45% to 50%. Wall motion was normal; there were no regional wall motion abnormalities. Features are consistent with a pseudonormal left ventricular filling pattern, with concomitant abnormal relaxation and increased filling pressure (grade 2 diastolic dysfunction). - Aortic valve: A stent-valve (TAVR) bioprosthesis was present and functioning normally. Valve area (VTI): 1.41 cm^2. - Left atrium: The atrium was moderately to severely dilated. Impressions: - Slight increase in TAVR gradients noted.  _____________  Echo 12/10/17 Study Conclusions - Left ventricle: The cavity size was normal. Wall thickness was   normal. Systolic function was moderately reduced. The estimated   ejection fraction was in the range of 35% to 40%. Diffuse   hypokinesis. Doppler parameters are consistent with abnormal left   ventricular relaxation (grade 1 diastolic dysfunction). No   evidence of thrombus. - Aortic valve: A TAVR stent-valve bioprosthesis was present and   functioning normally. Valve area (VTI): 1.31 cm^2. Valve area   (Vmax): 1.31 cm^2. Valve area (Vmean): 1.34 cm^2. - Left atrium: The atrium was moderately dilated. - Atrial septum: No defect or patent foramen ovale was identified. Impressions: - LV EF is lower than reported on the last study from October 2018,   but direct image comarison shows similar findings. TAVR gradients   are higher than in October 2018, but similar to those reported in   September 2018.   ASSESSMENT & PLAN:   Severe AS s/p TAVR: doing well. Echo shows EF 35%, normally functioning TAVR valve with mean gradient 19 mm Hg. TAVR gradients were higher than echo in 12/2016, but similar to those reported in 11/2016. Continue aspirin 81mg  daily. SBE prophylaxis discussed. He has full dentures.   Chronic combined S/D CHF: EF down to 35-40%. Appears euvolemic. Continue lasix 40mg  daily. He is on Coreg 3.125mg  BID. Cannot titrate this further due to sinus  bradycardia. He is not on an ACE or ARB. Last BMET showed normal renal function. His BP is elevated today. Given cardiomyopathy will start Losartan. Follow up BMET in 1-2 weeks  HTN: BP elevated today. Currently on amlodipine 10mg   daily, coreg 3.125mg  BID, hydralazine 25mg  TID. Will stop amlodipine 10mg  daily and start Losartan 100mg  daily. Will follow renal function and electrolytes  Colon cancer: s/p resection. Per Dr. Benay Spice he has a good prognosis for long term survival    Medication Adjustments/Labs and Tests Ordered: Current medicines are reviewed at length with the patient today.  Concerns regarding medicines are outlined above.  Medication changes, Labs and Tests ordered today are listed in the Patient Instructions below. Patient Instructions  Medication Instructions:  1) STOP NORVASC 2) START LOSARTAN 100 mg daily  Labwork: Labs in 2 WEEKS. You do not need to be fasting. You can come any time between 7:30AM and 5:00PM.   Testing/Procedures: None  Follow-Up: Please keep your appointment with Dr. [name] on 04/13/2018 at 2:00PM.    Signed, Angelena Form, PA-C  02/03/2018 5:16 PM    [address], [place],   [address] Phone: [phone]; Fax: [phone]

## 2018-02-03 ENCOUNTER — Encounter: Payer: Self-pay | Admitting: Physician Assistant

## 2018-02-03 ENCOUNTER — Ambulatory Visit (INDEPENDENT_AMBULATORY_CARE_PROVIDER_SITE_OTHER): Payer: Medicare Other | Admitting: Physician Assistant

## 2018-02-03 VITALS — BP 160/78 | HR 68 | Ht 72.0 in | Wt 220.8 lb

## 2018-02-03 DIAGNOSIS — I10 Essential (primary) hypertension: Secondary | ICD-10-CM | POA: Diagnosis not present

## 2018-02-03 DIAGNOSIS — C189 Malignant neoplasm of colon, unspecified: Secondary | ICD-10-CM

## 2018-02-03 DIAGNOSIS — I5042 Chronic combined systolic (congestive) and diastolic (congestive) heart failure: Secondary | ICD-10-CM | POA: Diagnosis not present

## 2018-02-03 DIAGNOSIS — Z952 Presence of prosthetic heart valve: Secondary | ICD-10-CM

## 2018-02-03 MED ORDER — LOSARTAN POTASSIUM 100 MG PO TABS: 100.0000 mg | ORAL_TABLET | Freq: Every day | ORAL | 3 refills | Status: DC

## 2018-02-03 NOTE — Patient Instructions (Signed)
Medication Instructions:  1) STOP NORVASC 2) START LOSARTAN 100 mg daily  Labwork: Labs in 2 WEEKS. You do not need to be fasting. You can come any time between 7:30AM and 5:00PM.   Testing/Procedures: None  Follow-Up: Please keep your appointment with Dr. [name] on 04/13/2018 at 2:00PM.

## 2018-02-07 ENCOUNTER — Encounter: Payer: Self-pay | Admitting: Thoracic Surgery (Cardiothoracic Vascular Surgery)

## 2018-02-11 ENCOUNTER — Other Ambulatory Visit: Payer: Self-pay | Admitting: Interventional Cardiology

## 2018-02-11 NOTE — Telephone Encounter (Signed)
Patient Instructions by Theodoro Parma, RN at 02/03/2018 1:30 PM  Author: Theodoro Parma, RN Author Type: Registered Nurse Filed: 02/03/2018 2:06 PM  Note Status: Signed Cosign: Cosign Not Required Encounter Date: 02/03/2018  Editor: Theodoro Parma, RN (Registered Nurse)    Medication Instructions:  1) STOP NORVASC 2) START LOSARTAN 100 mg daily

## 2018-02-17 ENCOUNTER — Other Ambulatory Visit: Payer: Medicare Other | Admitting: *Deleted

## 2018-02-17 DIAGNOSIS — Z952 Presence of prosthetic heart valve: Secondary | ICD-10-CM

## 2018-02-17 DIAGNOSIS — I5042 Chronic combined systolic (congestive) and diastolic (congestive) heart failure: Secondary | ICD-10-CM | POA: Diagnosis not present

## 2018-02-17 LAB — BASIC METABOLIC PANEL
BUN/Creatinine Ratio: 13 (ref 10–24)
BUN: 19 mg/dL (ref 8–27)
CO2: 23 mmol/L (ref 20–29)
CREATININE: 1.48 mg/dL — AB (ref 0.76–1.27)
Calcium: 9.5 mg/dL (ref 8.6–10.2)
Chloride: 102 mmol/L (ref 96–106)
GFR, EST AFRICAN AMERICAN: 50 mL/min/{1.73_m2} — AB (ref >59)
GFR, EST NON AFRICAN AMERICAN: 43 mL/min/{1.73_m2} — AB (ref >59)
Glucose: 100 mg/dL — ABNORMAL HIGH (ref 65–99)
Potassium: 4.6 mmol/L (ref 3.5–5.2)
SODIUM: 141 mmol/L (ref 134–144)

## 2018-02-18 ENCOUNTER — Other Ambulatory Visit: Payer: Self-pay

## 2018-02-18 ENCOUNTER — Telehealth: Payer: Self-pay | Admitting: Nurse Practitioner

## 2018-02-18 DIAGNOSIS — I5042 Chronic combined systolic (congestive) and diastolic (congestive) heart failure: Secondary | ICD-10-CM

## 2018-02-18 DIAGNOSIS — I10 Essential (primary) hypertension: Secondary | ICD-10-CM

## 2018-02-18 DIAGNOSIS — Z952 Presence of prosthetic heart valve: Secondary | ICD-10-CM

## 2018-02-18 MED ORDER — CARVEDILOL 3.125 MG PO TABS: 3.1250 mg | ORAL_TABLET | Freq: Two times a day (BID) | ORAL | 3 refills | Status: DC

## 2018-02-18 NOTE — Telephone Encounter (Signed)
Pt wife called asking for a refill on Carvedilol 3.125 would Dr. [name] be ok filling this med? Thank you.

## 2018-02-18 NOTE — Telephone Encounter (Signed)
Ok to fill. Thanks

## 2018-02-18 NOTE — Telephone Encounter (Signed)
-----   Message from Eileen Stanford, PA-C sent at 02/17/2018  7:39 PM EST ----- Creat increased which we expect when adding an angiotension receptor blocker. Lets check another BMET in 7-10 days to make sure its stable.

## 2018-02-18 NOTE — Telephone Encounter (Signed)
Results and plan of care reviewed with patient's wife and repeat lab appointment scheduled for 12/4. She thanked me for the call.

## 2018-02-23 ENCOUNTER — Other Ambulatory Visit: Payer: Self-pay | Admitting: Interventional Cardiology

## 2018-02-23 NOTE — Telephone Encounter (Signed)
Outpatient Medication Detail    Disp Refills Start End   carvedilol (COREG) 3.125 MG tablet 180 tablet 3 02/18/2018    Sig - Route: Take 1 tablet (3.125 mg total) by mouth 2 (two) times daily. - Oral   Sent to pharmacy as: carvedilol (COREG) 3.125 MG tablet   E-Prescribing Status: Receipt confirmed by pharmacy (02/18/2018 11:04 AM EST)   Pharmacy   [id number] - [place], [place]

## 2018-02-26 ENCOUNTER — Other Ambulatory Visit: Payer: Self-pay | Admitting: Physician Assistant

## 2018-03-02 ENCOUNTER — Other Ambulatory Visit: Payer: Medicare Other | Admitting: *Deleted

## 2018-03-02 DIAGNOSIS — I10 Essential (primary) hypertension: Secondary | ICD-10-CM | POA: Diagnosis not present

## 2018-03-02 DIAGNOSIS — I5042 Chronic combined systolic (congestive) and diastolic (congestive) heart failure: Secondary | ICD-10-CM | POA: Diagnosis not present

## 2018-03-02 DIAGNOSIS — Z952 Presence of prosthetic heart valve: Secondary | ICD-10-CM | POA: Diagnosis not present

## 2018-03-02 LAB — BASIC METABOLIC PANEL
BUN / CREAT RATIO: 13 (ref 10–24)
BUN: 18 mg/dL (ref 8–27)
CO2: 21 mmol/L (ref 20–29)
CREATININE: 1.35 mg/dL — AB (ref 0.76–1.27)
Calcium: 9.1 mg/dL (ref 8.6–10.2)
Chloride: 106 mmol/L (ref 96–106)
GFR calc Af Amer: 56 mL/min/{1.73_m2} — ABNORMAL LOW (ref >59)
GFR, EST NON AFRICAN AMERICAN: 49 mL/min/{1.73_m2} — AB (ref >59)
GLUCOSE: 115 mg/dL — AB (ref 65–99)
POTASSIUM: 4.2 mmol/L (ref 3.5–5.2)
SODIUM: 141 mmol/L (ref 134–144)

## 2018-03-02 NOTE — Telephone Encounter (Signed)
Medication not listed on current med list, looks like patient was instructed to stop taking at 12/18/17 hospital d/c. I spoke with patient and his wife and they confirmed that he is not taking this medication but [facility] and they have requested multiple times that it be removed from his profile at [facility]. Made them aware that I would refuse refill with a msg that patient no longer taking and request that they delete it from his profile.

## 2018-03-04 ENCOUNTER — Telehealth: Payer: Self-pay | Admitting: *Deleted

## 2018-03-04 NOTE — Telephone Encounter (Signed)
I spoke with pt's wife and reviewed lab work results with her.  Pt has not been checking his BP on a regular basis. No readings available.  Wife will begin checking and call us in about a week with the readings.

## 2018-03-04 NOTE — Telephone Encounter (Signed)
-----   Message from Eileen Stanford, PA-C sent at 03/03/2018  4:28 PM EST ----- Has he been checking his BP at home at all?

## 2018-03-04 NOTE — Telephone Encounter (Signed)
Notes recorded by Eileen Stanford, PA-C on 03/03/2018 at 4:28 PM EST Has he been checking his BP at home at all? ------  Notes recorded by Eileen Stanford, PA-C on 03/03/2018 at 4:26 PM EST Labs are stable on Losartan. No need to check again

## 2018-03-04 NOTE — Telephone Encounter (Signed)
Thanks pat.  

## 2018-03-06 ENCOUNTER — Other Ambulatory Visit: Payer: Self-pay

## 2018-03-06 ENCOUNTER — Emergency Department (HOSPITAL_COMMUNITY)
Admission: EM | Admit: 2018-03-06 | Discharge: 2018-03-07 | Disposition: A | Discharge status: Home / Self Care | Payer: Medicare Other | Attending: Emergency Medicine | Admitting: Emergency Medicine

## 2018-03-06 DIAGNOSIS — R0602 Shortness of breath: Secondary | ICD-10-CM | POA: Diagnosis not present

## 2018-03-06 DIAGNOSIS — Z7982 Long term (current) use of aspirin: Secondary | ICD-10-CM | POA: Diagnosis not present

## 2018-03-06 DIAGNOSIS — I110 Hypertensive heart disease with heart failure: Secondary | ICD-10-CM | POA: Diagnosis not present

## 2018-03-06 DIAGNOSIS — Z87891 Personal history of nicotine dependence: Secondary | ICD-10-CM | POA: Insufficient documentation

## 2018-03-06 DIAGNOSIS — I5043 Acute on chronic combined systolic (congestive) and diastolic (congestive) heart failure: Secondary | ICD-10-CM

## 2018-03-06 DIAGNOSIS — Z79899 Other long term (current) drug therapy: Secondary | ICD-10-CM | POA: Diagnosis not present

## 2018-03-06 DIAGNOSIS — I10 Essential (primary) hypertension: Secondary | ICD-10-CM | POA: Diagnosis not present

## 2018-03-06 NOTE — ED Triage Notes (Signed)
Patient c/o SOB that started 30 minutes ago while watching football. Denies CP.

## 2018-03-06 NOTE — ED Provider Notes (Signed)
[facility] EMERGENCY DEPARTMENT Provider Note   CSN: [id number] Arrival date & time: 03/06/18  2336     History   Chief Complaint Chief Complaint  Patient presents with  . Shortness of Breath    HPI Robert Horn is a 84 y.o. male.   84 y.o. male with a history of chronic combined S/D CHF, HTN, HLD, colon cancer and severe aortic stenosis s/p TAVR (12/15/16) presents with acute onset of shortness of breath that occurred while he was watching football about 30 minutes ago.  States everything was fine prior to this.  He is now feeling improved.  He does not know what made his breathing better.  No shortness of breath was not associated with any chest pain.  There is no nausea, vomiting, diaphoresis, cough or fever.  No leg pain or leg swelling.  States compliance with medications including his Lasix.  Has had similar symptoms in the past with heart failure exacerbations.  Wife reports his recent blood work was reassuring at his cardiologist.  Patient denies any chest pain.  He is never had a heart attack or any stents in his heart.  The history is provided by the patient and the spouse.  Shortness of Breath  Pertinent negatives include no fever, no headaches, no cough, no vomiting, no abdominal pain, no rash and no leg swelling.    Past Medical History:  Diagnosis Date  . CHF (congestive heart failure) ([place])   . Dyslipidemia 10/27/2015  . Dyspnea    w/ exertion   . Elevated PSA 10/27/2015  . Erectile dysfunction 10/27/2015  . Heart murmur   . Hypertension 10/27/2015  . Hypogonadism male 10/27/2015  . Obesity 10/27/2015  . Pneumonia 10/27/2015   pt states was 1980  . Rotator cuff tear 10/27/2015    Patient Active Problem List   Diagnosis Date Noted  . Colonic mass 12/10/2017  . S/P TAVR (transcatheter aortic valve replacement) 03/03/2017  . Severe aortic stenosis 12/15/2016  . Essential hypertension 05/09/2013  . Hyperlipidemia 05/09/2013  . PVC's  (premature ventricular contractions) 05/09/2013  . Chronic combined systolic and diastolic heart failure ([place]) 05/09/2013    Past Surgical History:  Procedure Laterality Date  . CARDIAC CATHETERIZATION N/A 11/14/2015   Procedure: Left Heart Cath and Coronary Angiography;  Surgeon: [name], MD;  Location: [facility];  Service: Cardiovascular;  Laterality: N/A;  . LAPAROSCOPIC PARTIAL COLECTOMY  12/15/2017   LAPAROSCOPIC PARTIAL COLECTOMY (N/A Abdomen)  . LAPAROSCOPIC PARTIAL COLECTOMY N/A 12/15/2017   Procedure: LAPAROSCOPIC PARTIAL COLECTOMY;  Surgeon: Erroll Luna, MD;  Location: [facility];  Service: General;  Laterality: N/A;  . TEE WITHOUT CARDIOVERSION N/A 12/15/2016   Procedure: TRANSESOPHAGEAL ECHOCARDIOGRAM (TEE);  Surgeon: Sherren Mocha, MD;  Location: [facility];  Service: Open Heart Surgery;  Laterality: N/A;  . TRANSCATHETER AORTIC VALVE REPLACEMENT, TRANSFEMORAL N/A 12/15/2016   Procedure: TRANSCATHETER AORTIC VALVE REPLACEMENT, TRANSFEMORAL;  Surgeon: Sherren Mocha, MD;  Location: [facility];  Service: Open Heart Surgery;  Laterality: N/A;        Home Medications    Prior to Admission medications   Medication Sig Start Date End Date Taking? Authorizing Provider  aspirin (ASPIRIN EC) 81 MG EC tablet Take 81 mg by mouth daily. Swallow whole.    [provider]  carvedilol (COREG) 3.125 MG tablet Take 1 tablet (3.125 mg total) by mouth 2 (two) times daily. 02/18/18   [name], MD  furosemide (LASIX) 40 MG tablet Take 1 tablet (  40 mg total) by mouth daily. 05/20/17   [name], MD  hydrALAZINE (APRESOLINE) 25 MG tablet TAKE 1 TABLET BY MOUTH EVERY 8 HOURS 09/20/17   [name], MD  losartan (COZAAR) 100 MG tablet Take 1 tablet (100 mg total) by mouth daily. 02/03/18 01/29/19  Eileen Stanford, PA-C  Multiple Vitamin (MULTIVITAMIN WITH MINERALS) TABS tablet Take 1 tablet by mouth daily. 10/27/15   Bonnell Public, MD  omega-3 acid ethyl esters  (LOVAZA) 1 g capsule TAKE ONE CAPSULE BY MOUTH EVERY DAY 09/20/17   [name], MD  VITAMIN B COMPLEX-C PO Take 1 tablet by mouth daily.    [provider]    Family History Family History  Problem Relation Age of Onset  . Diabetes Mother   . Heart disease Mother   . Pulmonary embolism Father     Social History Social History   Tobacco Use  . Smoking status: Former Smoker    Types: Cigarettes  . Smokeless tobacco: Never Used  Substance Use Topics  . Alcohol use: Yes    Comment: rarely  . Drug use: No     Allergies   Patient has no known allergies.   Review of Systems Review of Systems  Constitutional: Negative for activity change, appetite change and fever.  HENT: Negative for congestion.   Eyes: Negative for visual disturbance.  Respiratory: Positive for shortness of breath. Negative for cough and chest tightness.   Cardiovascular: Negative for leg swelling.  Gastrointestinal: Negative for abdominal pain, nausea and vomiting.  Genitourinary: Negative for dysuria and hematuria.  Musculoskeletal: Negative for arthralgias, back pain and myalgias.  Skin: Negative for rash.  Neurological: Negative for dizziness, weakness and headaches.    all other systems are negative except as noted in the HPI and PMH.    Physical Exam Updated Vital Signs BP (!) 155/85   Pulse (!) 59   Temp 97.6 F (36.4 C) (Oral)   Resp 13   Ht 6' (1.829 m)   Wt 9.979 kg   SpO2 100%   BMI 2.98 kg/m   Physical Exam  Constitutional: He is oriented to person, place, and time. He appears well-developed and well-nourished. No distress.  Speaking short sentences, mild increased work of breathing  HENT:  Head: Normocephalic and atraumatic.  Mouth/Throat: Oropharynx is clear and moist. No oropharyngeal exudate.  Eyes: Pupils are equal, round, and reactive to light. Conjunctivae and EOM are normal.  Neck: Normal range of motion. Neck supple.  No meningismus.  Cardiovascular:  Normal rate, regular rhythm, normal heart sounds and intact distal pulses.  No murmur heard. Pulmonary/Chest: Effort normal and breath sounds normal. No respiratory distress.  Diminished breath sounds bilaterally  Abdominal: Soft. There is no tenderness. There is no rebound and no guarding.  Musculoskeletal: Normal range of motion. He exhibits no edema or tenderness.  Neurological: He is alert and oriented to person, place, and time. No cranial nerve deficit. He exhibits normal muscle tone. Coordination normal.  No ataxia on finger to nose bilaterally. No pronator drift. 5/5 strength throughout. CN 2-12 intact.Equal grip strength. Sensation intact.   Skin: Skin is warm.  Psychiatric: He has a normal mood and affect. His behavior is normal.  Nursing note and vitals reviewed.    ED Treatments / Results  Labs (all labs ordered are listed, but only abnormal results are displayed) Labs Reviewed  CBC WITH DIFFERENTIAL/PLATELET - Abnormal; Notable for the following components:      Result Value  RBC 4.06 (*)    Hemoglobin 11.1 (*)    HCT 37.7 (*)    MCHC 29.4 (*)    RDW 16.2 (*)    All other components within normal limits  BASIC METABOLIC PANEL - Abnormal; Notable for the following components:   Glucose, Bld 127 (*)    Creatinine, Ser 1.78 (*)    GFR calc non Af Amer 35 (*)    GFR calc Af Amer 40 (*)    All other components within normal limits  BRAIN NATRIURETIC PEPTIDE - Abnormal; Notable for the following components:   B Natriuretic Peptide 633.8 (*)    All other components within normal limits  D-DIMER, QUANTITATIVE (NOT AT [facility]) - Abnormal; Notable for the following components:   D-Dimer, Quant 2.78 (*)    All other components within normal limits  PROTIME-INR  I-STAT TROPONIN, ED    EKG EKG Interpretation  Date/Time:  Sunday March 06 2018 23:43:58 EST Ventricular Rate:  75 PR Interval:    QRS Duration: 117 QT Interval:  457 QTC Calculation: 445 R  Axis:   89 Text Interpretation:  Sinus rhythm Ventricular bigeminy Borderline prolonged PR interval Consider left atrial enlargement Incomplete right bundle branch block Consider anterior infarct frequent PVCs lateral ST depressions similar to 2017 Confirmed by Ezequiel Essex [phone]) on 03/07/2018 12:00:10 AM   Radiology Dg Chest 2 View  Result Date: 03/07/2018 CLINICAL DATA:  84 year old male with shortness of breath. EXAM: CHEST - 2 VIEW COMPARISON:  Chest CT dated 12/10/2017 FINDINGS: Minimal bibasilar interstitial densities may represent atelectatic changes/scarring or mild edema. Developing infiltrate is less likely but not excluded. Clinical correlation is recommended. There is no focal consolidation, pleural effusion, pneumothorax. Mild cardiomegaly with aortic valve repair. No acute osseous pathology. IMPRESSION: Bibasilar interstitial densities may represent atelectasis/scarring or mild edema. Developing infiltrate is less likely. Electronically Signed   By: Anner Crete M.D.   On: 03/07/2018 00:52   Ct Angio Chest Pe W And/or Wo Contrast  Result Date: 03/07/2018 CLINICAL DATA:  84 year old male with shortness of breath. Concern for pulmonary embolism. EXAM: CT ANGIOGRAPHY CHEST WITH CONTRAST TECHNIQUE: Multidetector CT imaging of the chest was performed using the standard protocol during bolus administration of intravenous contrast. Multiplanar CT image reconstructions and MIPs were obtained to evaluate the vascular anatomy. CONTRAST:  <See Chart> ISOVUE-370 IOPAMIDOL (ISOVUE-370) INJECTION 76% COMPARISON:  Chest radiograph dated 03/07/2018 and CT dated 12/10/2017 FINDINGS: Cardiovascular: There is mild cardiomegaly. Aortic valve repair. No pericardial effusion. There is retrograde flow of contrast from the right atrium into the IVC consistent with right heart dysfunction. Mild atherosclerotic calcification of the thoracic aorta. No aneurysmal dilatation. There is no CT evidence of  pulmonary embolism. Mediastinum/Nodes: No hilar adenopathy. Subcarinal lymph node measures 13 mm short axis. The esophagus and the thyroid gland are grossly unremarkable. No mediastinal fluid collection. Lungs/Pleura: Small right and probable trace left pleural effusions. There are minimal bibasilar atelectasis as well as bibasilar interstitial prominence and interlobular septal thickening. No focal consolidation, pleural/that no focal consolidation or pneumothorax. Mild centrilobular emphysema. The central airways are patent. Upper Abdomen: There is a midline vertical surgical scar which is new compared to prior CT areas of nodularity noted in the upper mesentery which may be related to recent surgery. An inflammatory process or metastatic implant are less likely but not excluded. Attention on follow-up imaging recommended. Musculoskeletal: Degenerative changes of the spine. No acute osseous pathology. Review of the MIP images confirms the above findings. IMPRESSION: 1. No  CT evidence of pulmonary embolism. 2. Mild cardiomegaly with findings of CHF and small bilateral pleural effusions. 3. Mild centrilobular emphysema. 4. Subcarinal lymph node measuring 13 mm short axis. 5. Areas of nodularity in the left upper abdomen likely related to recent surgery. Attention on follow-up imaging recommended. Electronically Signed   By: Anner Crete M.D.   On: 03/07/2018 01:58    Procedures Procedures (including critical care time)  Medications Ordered in ED Medications  furosemide (LASIX) injection 40 mg (40 mg Intravenous Given 03/07/18 0045)  iopamidol (ISOVUE-370) 76 % injection 80 mL (80 mLs Intravenous Contrast Given 03/07/18 0124)     Initial Impression / Assessment and Plan / ED Course  I have reviewed the triage vital signs and the nursing notes.  Pertinent labs & imaging results that were available during my care of the patient were reviewed by me and considered in my medical decision making (see  chart for details).    Patient with combined heart failure as well as aortic stenosis status post valve replacement presenting with acute onset of shortness of breath that occurred at rest. Now feeling improved.   EKG is unchanged.  Patient denies any chest pain.  Chest x-ray is concerning for mild interstitial edema.  Is given IV Lasix.  D-dimer is elevated.  Subsequent CT PE is negative but does show evidence of small pleural effusions and CHF.  Patient feels his breathing is back to baseline and wishes to go home.  Will check second troponin attempt ambulation with pulse ox.  Troponin negative x2.  Patient states he is breathing at baseline.  Admission offered for IV diuresis but patient is adamant that he wants to go home.  Does have slight elevation of his kidney function compared to 4 days ago. This will need to be rechecked after diuresis.   Advised to increase his Lasix to twice daily for the next 3 days then go back to once daily.  Will need recheck of his potassium and kidney function next week.  Patient anxious to go home. States he is breathing at his baseline. Troponin negative x2. Return precautions discussed.   Final Clinical Impressions(s) / ED Diagnoses   Final diagnoses:  Acute on chronic combined systolic and diastolic congestive heart failure [facility])    ED Discharge Orders    None       Ezequiel Essex, MD 03/07/18 [id number]

## 2018-03-07 ENCOUNTER — Emergency Department (HOSPITAL_COMMUNITY): Payer: Medicare Other

## 2018-03-07 ENCOUNTER — Telehealth: Payer: Self-pay | Admitting: Interventional Cardiology

## 2018-03-07 DIAGNOSIS — R0602 Shortness of breath: Secondary | ICD-10-CM | POA: Diagnosis not present

## 2018-03-07 LAB — CBC WITH DIFFERENTIAL/PLATELET
ABS IMMATURE GRANULOCYTES: 0.02 10*3/uL (ref 0.00–0.07)
Basophils Absolute: 0.1 10*3/uL (ref 0.0–0.1)
Basophils Relative: 1 %
EOS ABS: 0.3 10*3/uL (ref 0.0–0.5)
Eosinophils Relative: 5 %
HCT: 37.7 % — ABNORMAL LOW (ref 39.0–52.0)
Hemoglobin: 11.1 g/dL — ABNORMAL LOW (ref 13.0–17.0)
IMMATURE GRANULOCYTES: 0 %
Lymphocytes Relative: 30 %
Lymphs Abs: 1.8 10*3/uL (ref 0.7–4.0)
MCH: 27.3 pg (ref 26.0–34.0)
MCHC: 29.4 g/dL — ABNORMAL LOW (ref 30.0–36.0)
MCV: 92.9 fL (ref 80.0–100.0)
MONO ABS: 1 10*3/uL (ref 0.1–1.0)
MONOS PCT: 17 %
NEUTROS PCT: 47 %
Neutro Abs: 2.7 10*3/uL (ref 1.7–7.7)
Platelets: 163 10*3/uL (ref 150–400)
RBC: 4.06 MIL/uL — ABNORMAL LOW (ref 4.22–5.81)
RDW: 16.2 % — AB (ref 11.5–15.5)
WBC: 5.9 10*3/uL (ref 4.0–10.5)
nRBC: 0 % (ref 0.0–0.2)

## 2018-03-07 LAB — BASIC METABOLIC PANEL
ANION GAP: 9 (ref 5–15)
BUN: 18 mg/dL (ref 8–23)
CO2: 25 mmol/L (ref 22–32)
Calcium: 9.1 mg/dL (ref 8.9–10.3)
Chloride: 107 mmol/L (ref 98–111)
Creatinine, Ser: 1.78 mg/dL — ABNORMAL HIGH (ref 0.61–1.24)
GFR calc Af Amer: 40 mL/min — ABNORMAL LOW (ref >60)
GFR calc non Af Amer: 35 mL/min — ABNORMAL LOW (ref >60)
GLUCOSE: 127 mg/dL — AB (ref 70–99)
Potassium: 4.3 mmol/L (ref 3.5–5.1)
Sodium: 141 mmol/L (ref 135–145)

## 2018-03-07 LAB — I-STAT TROPONIN, ED
TROPONIN I, POC: 0.05 ng/mL (ref 0.00–0.08)
TROPONIN I, POC: 0.05 ng/mL (ref 0.00–0.08)

## 2018-03-07 LAB — PROTIME-INR
INR: 0.95
Prothrombin Time: 12.6 seconds (ref 11.4–15.2)

## 2018-03-07 LAB — BRAIN NATRIURETIC PEPTIDE: B NATRIURETIC PEPTIDE 5: 633.8 pg/mL — AB (ref 0.0–100.0)

## 2018-03-07 LAB — D-DIMER, QUANTITATIVE: D-Dimer, Quant: 2.78 ug/mL-FEU — ABNORMAL HIGH (ref 0.00–0.50)

## 2018-03-07 MED ORDER — IOPAMIDOL (ISOVUE-370) INJECTION 76%
INTRAVENOUS | Status: AC
  Filled 2018-03-07: qty 100

## 2018-03-07 MED ORDER — FUROSEMIDE 10 MG/ML IJ SOLN
40.0000 mg | Freq: Once | INTRAMUSCULAR | Status: AC
  Administered 2018-03-07: 40 mg via INTRAVENOUS
  Filled 2018-03-07: qty 4

## 2018-03-07 MED ORDER — IOPAMIDOL (ISOVUE-370) INJECTION 76%
80.0000 mL | Freq: Once | INTRAVENOUS | Status: AC | PRN
  Administered 2018-03-07: 80 mL via INTRAVENOUS

## 2018-03-07 MED ORDER — HYDRALAZINE HCL 20 MG/ML IJ SOLN
5.0000 mg | Freq: Once | INTRAMUSCULAR | Status: AC
  Administered 2018-03-07: 5 mg via INTRAVENOUS
  Filled 2018-03-07: qty 1

## 2018-03-07 NOTE — Telephone Encounter (Signed)
[name], MD  P Cv Div Ch St Triage Cc: Loren Racer, LPN; Eileen Stanford, PA-C        The patient is status post TAVR greater than 1 year ago. Decreased LV function with EF 35 to 40%. Recently started on losartan and TAVR clinic. In ER was felt to be in CHF. Furosemide was doubled to 40 mg twice daily. After 3 days I recommend that he decrease to 60 mg once daily.   He needs basic metabolic panel done on Wednesday or Thursday to follow kidney function. Creatinine increased to 1.7 (03/06/2018) after starting low-dose losartan in November.   Needs to be seen this week in the clinic for follow-up.    Spoke with pt and scheduled him to see Daune Perch, NP on 12/11.  Pt will have labs drawn the same day.  Pt appreciative for call.

## 2018-03-07 NOTE — ED Notes (Signed)
Pt stat dropped to 90% on RA with ambulation

## 2018-03-07 NOTE — Discharge Instructions (Addendum)
Increase your Lasix to twice daily for the next 3 days then go back to once daily.  Follow-up with your doctor for recheck of your electrolytes and kidney function next week.  Return to the ED with worsening difficulty breathing, chest pain or any other concerns.

## 2018-03-09 ENCOUNTER — Encounter: Payer: Self-pay | Admitting: Cardiology

## 2018-03-09 ENCOUNTER — Ambulatory Visit (INDEPENDENT_AMBULATORY_CARE_PROVIDER_SITE_OTHER): Payer: Medicare Other | Admitting: Cardiology

## 2018-03-09 DIAGNOSIS — N179 Acute kidney failure, unspecified: Secondary | ICD-10-CM

## 2018-03-09 DIAGNOSIS — I5043 Acute on chronic combined systolic (congestive) and diastolic (congestive) heart failure: Secondary | ICD-10-CM

## 2018-03-09 DIAGNOSIS — I10 Essential (primary) hypertension: Secondary | ICD-10-CM

## 2018-03-09 DIAGNOSIS — Z952 Presence of prosthetic heart valve: Secondary | ICD-10-CM | POA: Diagnosis not present

## 2018-03-09 MED ORDER — FUROSEMIDE 40 MG PO TABS: 60.0000 mg | ORAL_TABLET | Freq: Every day | ORAL | 3 refills | Status: DC

## 2018-03-09 MED ORDER — AMLODIPINE BESYLATE 10 MG PO TABS: 10.0000 mg | ORAL_TABLET | Freq: Every day | ORAL | 3 refills | Status: DC

## 2018-03-09 NOTE — Progress Notes (Signed)
Cardiology Office Note:    Date:  03/09/2018   ID:  [name], DOB 1934/03/01, MRN [id number]  PCP:  [name], MD  Cardiologist:  Sinclair Grooms, MD  Referring MD: [name], MD   Chief Complaint  Patient presents with  . Hospitalization Follow-up    CHF    History of Present Illness:    Robert Horn is a 84 y.o. male with a past medical history significant for chronic combined S/D CHF, HTN, HLD, colon cancer and severe aortic stenosis s/p TAVR (12/15/16).  EF was down, 25-30, in setting of severe aortic stenosis.  About a month after TAVR echocardiogram showed improvement in EF to 45-50% with mean gradient of 12 mmmHg.   He was admitted to the hospital in September for newly diagnosed colon cancer requiring partial colectomy.  Echo during that admission showed EF 35-40% with normally functioning TAVR.   He was last seen in the office on 02/03/2018 by [name], PA.  With reduced EF he was continued on Lasix 40 mg daily, low-dose Coreg unable to titrate further due to sinus bradycardia.  He was started on losartan 100 mg daily.  Mr. Venning went to the ED on 03/06/2018 with acute onset of shortness of breath while watching football.  Chest x-ray was concerning for mild interstitial edema.  D-dimer was elevated but the chest CT was negative for PE, did show pleural effusions and CHF.  BNP was 633.  Troponins were negative x2.  He was given IV Lasix and feeling back to his baseline so he was discharged home.  His Lasix was increased to twice daily for 3 days and then back to once daily.  Serum creatinine was elevated at 1.78. K+ 4.3  [name] is here today for close follow-up and labs with his wife who is very involved in his care. He took the 3 days of extra lasix - last today. Will go to once daily tomorrow.   Today his breathing is better, back to normal. He sleeps in a flat bed with no orthopnea currently. He sometimes awakens a little short of breath but he turns  over and is better. He denies chest pain/pressure/tightness or edema. He does not weigh daily. His wife has not been limiting his salt, she prepares all of the food.   Review of home medication list finds that the patient has been on Olmesartan from another source that we did not have on our list. He has been taking both Olmesartan and losartan.   Past Medical History:  Diagnosis Date  . CHF (congestive heart failure) ([place])   . Dyslipidemia 10/27/2015  . Dyspnea    w/ exertion   . Elevated PSA 10/27/2015  . Erectile dysfunction 10/27/2015  . Heart murmur   . Hypertension 10/27/2015  . Hypogonadism male 10/27/2015  . Obesity 10/27/2015  . Pneumonia 10/27/2015   pt states was 1980  . Rotator cuff tear 10/27/2015    Past Surgical History:  Procedure Laterality Date  . CARDIAC CATHETERIZATION N/A 11/14/2015   Procedure: Left Heart Cath and Coronary Angiography;  Surgeon: [name], MD;  Location: [facility];  Service: Cardiovascular;  Laterality: N/A;  . LAPAROSCOPIC PARTIAL COLECTOMY  12/15/2017   LAPAROSCOPIC PARTIAL COLECTOMY (N/A Abdomen)  . LAPAROSCOPIC PARTIAL COLECTOMY N/A 12/15/2017   Procedure: LAPAROSCOPIC PARTIAL COLECTOMY;  Surgeon: Erroll Luna, MD;  Location: [facility];  Service: General;  Laterality: N/A;  . TEE WITHOUT CARDIOVERSION N/A 12/15/2016   Procedure: TRANSESOPHAGEAL  ECHOCARDIOGRAM (TEE);  Surgeon: Sherren Mocha, MD;  Location: [facility];  Service: Open Heart Surgery;  Laterality: N/A;  . TRANSCATHETER AORTIC VALVE REPLACEMENT, TRANSFEMORAL N/A 12/15/2016   Procedure: TRANSCATHETER AORTIC VALVE REPLACEMENT, TRANSFEMORAL;  Surgeon: Sherren Mocha, MD;  Location: [facility];  Service: Open Heart Surgery;  Laterality: N/A;    Current Medications: Current Meds  Medication Sig  . amLODipine (NORVASC) 10 MG tablet Take 1 tablet (10 mg total) by mouth daily.  Marland Kitchen aspirin (ASPIRIN EC) 81 MG EC tablet Take 81 mg by mouth daily. Swallow whole.  . carvedilol (COREG)  3.125 MG tablet Take 1 tablet (3.125 mg total) by mouth 2 (two) times daily.  . ferrous sulfate 325 (65 FE) MG tablet Take 325 mg by mouth daily with breakfast.  . furosemide (LASIX) 40 MG tablet Take 1.5 tablets (60 mg total) by mouth daily.  . hydrALAZINE (APRESOLINE) 25 MG tablet TAKE 1 TABLET BY MOUTH EVERY 8 HOURS  . losartan (COZAAR) 100 MG tablet Take 1 tablet (100 mg total) by mouth daily.  . Multiple Vitamin (MULTIVITAMIN WITH MINERALS) TABS tablet Take 1 tablet by mouth daily.  Marland Kitchen omega-3 acid ethyl esters (LOVAZA) 1 g capsule TAKE ONE CAPSULE BY MOUTH EVERY DAY  . VITAMIN B COMPLEX-C PO Take 1 tablet by mouth daily.  . [DISCONTINUED] furosemide (LASIX) 40 MG tablet Take 1 tablet (40 mg total) by mouth daily.  . [DISCONTINUED] olmesartan (BENICAR) 40 MG tablet Take 40 mg by mouth daily.     Allergies:   Patient has no known allergies.   Social History   Socioeconomic History  . Marital status: Single    Spouse name: Not on file  . Number of children: Not on file  . Years of education: Not on file  . Highest education level: Not on file  Occupational History  . Not on file  Social Needs  . Financial resource strain: Not on file  . Food insecurity:    Worry: Not on file    Inability: Not on file  . Transportation needs:    Medical: Not on file    Non-medical: Not on file  Tobacco Use  . Smoking status: Former Smoker    Types: Cigarettes  . Smokeless tobacco: Never Used  Substance and Sexual Activity  . Alcohol use: Yes    Comment: rarely  . Drug use: No  . Sexual activity: Not on file  Lifestyle  . Physical activity:    Days per week: Not on file    Minutes per session: Not on file  . Stress: Not on file  Relationships  . Social connections:    Talks on phone: Not on file    Gets together: Not on file    Attends religious service: Not on file    Active member of club or organization: Not on file    Attends meetings of clubs or organizations: Not on file     Relationship status: Not on file  Other Topics Concern  . Not on file  Social History Narrative  . Not on file     Family History: The patient's family history includes Diabetes in his mother; Heart disease in his mother; Pulmonary embolism in his father. ROS:   Please see the history of present illness.     All other systems reviewed and are negative.  EKGs/Labs/Other Studies Reviewed:    The following studies were reviewed today:  Echocardiogram 12/10/2017 Study Conclusions - Left ventricle: The cavity size was normal. Wall  thickness was   normal. Systolic function was moderately reduced. The estimated   ejection fraction was in the range of 35% to 40%. Diffuse   hypokinesis. Doppler parameters are consistent with abnormal left   ventricular relaxation (grade 1 diastolic dysfunction). No   evidence of thrombus. - Aortic valve: A TAVR stent-valve bioprosthesis was present and   functioning normally. Valve area (VTI): 1.31 cm^2. Valve area   (Vmax): 1.31 cm^2. Valve area (Vmean): 1.34 cm^2. - Left atrium: The atrium was moderately dilated. - Atrial septum: No defect or patent foramen ovale was identified.  Impressions: - LV EF is lower than reported on the last study from October 2018,   but direct image comarison shows similar findings. TAVR gradients   are higher than in October 2018, but similar to those reported in   September 2018.  Echocardiogram 01/07/2017 Study Conclusions - Left ventricle: The cavity size was mildly dilated. Systolic   function was mildly reduced. The estimated ejection fraction was   in the range of 45% to 50%. Wall motion was normal; there were no   regional wall motion abnormalities. Features are consistent with   a pseudonormal left ventricular filling pattern, with concomitant   abnormal relaxation and increased filling pressure (grade 2   diastolic dysfunction). - Aortic valve: A stent-valve (TAVR) bioprosthesis was present and    functioning normally. Valve area (VTI): 1.41 cm^2. - Left atrium: The atrium was moderately to severely dilated.  Impressions: - Slight increase in TAVR gradients noted.   EKG:  EKG is not ordered today.    Recent Labs: 12/11/2017: ALT 11 12/12/2017: Magnesium 2.3 12/16/2017: TSH 0.505 03/06/2018: B Natriuretic Peptide 633.8; BUN 18; Creatinine, Ser 1.78; Hemoglobin 11.1; Platelets 163; Potassium 4.3; Sodium 141   Recent Lipid Panel No results found for: CHOL, TRIG, HDL, CHOLHDL, VLDL, LDLCALC, LDLDIRECT  Physical Exam:    VS:  BP 136/86 (BP Location: Left Arm, Patient Position: Sitting, Cuff Size: Normal)   Pulse (!) 48   Ht 6' (1.829 m)   Wt 219 lb 12.8 oz (99.7 kg)   SpO2 97%   BMI 29.81 kg/m     Wt Readings from Last 3 Encounters:  03/09/18 219 lb 12.8 oz (99.7 kg)  03/06/18 22 lb (9.979 kg)  02/03/18 220 lb 12.8 oz (100.2 kg)     Physical Exam  Constitutional: He is oriented to person, place, and time. He appears well-developed and well-nourished. No distress.  HENT:  Head: Normocephalic and atraumatic.  Neck: Normal range of motion. Neck supple. No JVD present.  Cardiovascular: Normal rate, regular rhythm and intact distal pulses. Exam reveals no gallop and no friction rub.  Murmur heard.  Systolic murmur is present with a grade of 2/6 at the upper right sternal border and upper left sternal border. Pulmonary/Chest: Effort normal and breath sounds normal. No respiratory distress. He has no wheezes. He has no rales.  Abdominal: Soft. Bowel sounds are normal.  Musculoskeletal: Normal range of motion. He exhibits no edema.  Neurological: He is alert and oriented to person, place, and time.  Skin: Skin is warm and dry.  Psychiatric: He has a normal mood and affect. His behavior is normal. Judgment and thought content normal.  Vitals reviewed.   ASSESSMENT:    1. Acute on chronic combined systolic and diastolic CHF (congestive heart failure) ([place])   2. AKI (acute  kidney injury) ([place])   3. S/P TAVR (transcatheter aortic valve replacement)   4. Essential hypertension  PLAN:    In order of problems listed above:  1.  Acute on chronic combined systolic and diastolic heart failure -EF down to 35-40% by echo in 12/17/2017.  Recent ER visit with acute shortness of breath, resolved with 1 dose of IV Lasix.  -Patient was advised to take Lasix twice daily for 3 days and then 60 mg daily (previously 40 mg daily) -His breathing is now back to normal and he has no orthopnea or peripheral edema -Continue low-dose carvedilol (unable to titrate due to bradycardia), continue losartan (pt was also taking olmesartan which I have asked him not to take) -I had a long discussion with the patient and his wife about management of heart failure.  I provided the heart failure zones handout, a weight log and heart failure education material.   -Low-sodium, heart healthy diet discussed.  His wife is concerned that she has been giving him too much salt.  She is requesting a nutrition consult for further education which I will place. -The patient and his wife would like close follow-up in about 2-3 weeks.  2. AKI -Creatinine increased to 1.78 after addition of losartan (patient was also taking olmesartan which was not on our list and ordered by another provider).  -I will discontinue the olmesartan, continue losartan and recheck BMet on Friday.  Plan was discussed with the patient and his wife and they are in agreement.  3. S/P TAVR 12/15/16 -Followed by TAVR clinic   4.  Hypertension -Blood pressure is elevated.  Patient's wife reports that it has been high at home as well.  His amlodipine had been discontinued to allow for addition of losartan.  With the discontinuation of olmesartan, I will add amlodipine back.  Continue losartan for now and check renal function on Friday.  5.  Colon cancer -S/P resection.  Followed by Dr. Benay Spice with good prognosis for long-term  survival.  Patient says he is doing well.   Medication Adjustments/Labs and Tests Ordered: Current medicines are reviewed at length with the patient today.  Concerns regarding medicines are outlined above. Labs and tests ordered and medication changes are outlined in the patient instructions below:  Patient Instructions  Medication Instructions:  STOP: Olmersartan  START: Amlodipine 10 MG daily  INCREASE: Lasix to 60 MG daily   If you need a refill on your cardiac medications before your next appointment, please call your pharmacy.   Lab work: FUTURE: BMET on 03/11/18  If you have labs (blood work) drawn today and your tests are completely normal, you will receive your results only by: Marland Kitchen MyChart Message (if you have MyChart) OR . A paper copy in the mail If you have any lab test that is abnormal or we need to change your treatment, we will call you to review the results.  Testing/Procedures: None   Follow-Up: You are scheduled to see Pecolia Ades NP on 03/31/17 @ 11:00 AM  You have been referred to see a nutritionist   Any Other Special Instructions Will Be Listed Below (If Applicable).   Low-Sodium Eating Plan Sodium, which is an element that makes up salt, helps you maintain a healthy balance of fluids in your body. Too much sodium can increase your blood pressure and cause fluid and waste to be held in your body. Your health care provider or dietitian may recommend following this plan if you have high blood pressure (hypertension), kidney disease, liver disease, or heart failure. Eating less sodium can help lower your blood pressure, reduce swelling,  and protect your heart, liver, and kidneys. What are tips for following this plan? General guidelines  Most people on this plan should limit their sodium intake to 1,500-2,000 mg (milligrams) of sodium each day. Reading food labels  The Nutrition Facts label lists the amount of sodium in one serving of the food. If you eat  more than one serving, you must multiply the listed amount of sodium by the number of servings.  Choose foods with less than 140 mg of sodium per serving.  Avoid foods with 300 mg of sodium or more per serving. Shopping  Look for lower-sodium products, often labeled as "low-sodium" or "no salt added."  Always check the sodium content even if foods are labeled as "unsalted" or "no salt added".  Buy fresh foods. ? Avoid canned foods and premade or frozen meals. ? Avoid canned, cured, or processed meats  Buy breads that have less than 80 mg of sodium per slice. Cooking  Eat more home-cooked food and less restaurant, buffet, and fast food.  Avoid adding salt when cooking. Use salt-free seasonings or herbs instead of table salt or sea salt. Check with your health care provider or pharmacist before using salt substitutes.  Cook with plant-based oils, such as canola, sunflower, or olive oil. Meal planning  When eating at a restaurant, ask that your food be prepared with less salt or no salt, if possible.  Avoid foods that contain MSG (monosodium glutamate). MSG is sometimes added to [place] food, bouillon, and some canned foods. What foods are recommended? The items listed may not be a complete list. Talk with your dietitian about what dietary choices are best for you. Grains Low-sodium cereals, including oats, puffed wheat and rice, and shredded wheat. Low-sodium crackers. Unsalted rice. Unsalted pasta. Low-sodium bread. Whole-grain breads and whole-grain pasta. Vegetables Fresh or frozen vegetables. "No salt added" canned vegetables. "No salt added" tomato sauce and paste. Low-sodium or reduced-sodium tomato and vegetable juice. Fruits Fresh, frozen, or canned fruit. Fruit juice. Meats and other protein foods Fresh or frozen (no salt added) meat, poultry, seafood, and fish. Low-sodium canned tuna and salmon. Unsalted nuts. Dried peas, beans, and lentils without added salt. Unsalted  canned beans. Eggs. Unsalted nut butters. Dairy Milk. Soy milk. Cheese that is naturally low in sodium, such as ricotta cheese, fresh mozzarella, or Swiss cheese Low-sodium or reduced-sodium cheese. Cream cheese. Yogurt. Fats and oils Unsalted butter. Unsalted margarine with no trans fat. Vegetable oils such as canola or olive oils. Seasonings and other foods Fresh and dried herbs and spices. Salt-free seasonings. Low-sodium mustard and ketchup. Sodium-free salad dressing. Sodium-free light mayonnaise. Fresh or refrigerated horseradish. Lemon juice. Vinegar. Homemade, reduced-sodium, or low-sodium soups. Unsalted popcorn and pretzels. Low-salt or salt-free chips. What foods are not recommended? The items listed may not be a complete list. Talk with your dietitian about what dietary choices are best for you. Grains Instant hot cereals. Bread stuffing, pancake, and biscuit mixes. Croutons. Seasoned rice or pasta mixes. Noodle soup cups. Boxed or frozen macaroni and cheese. Regular salted crackers. Self-rising flour. Vegetables Sauerkraut, pickled vegetables, and relishes. Olives. [place] fries. Onion rings. Regular canned vegetables (not low-sodium or reduced-sodium). Regular canned tomato sauce and paste (not low-sodium or reduced-sodium). Regular tomato and vegetable juice (not low-sodium or reduced-sodium). Frozen vegetables in sauces. Meats and other protein foods Meat or fish that is salted, canned, smoked, spiced, or pickled. Bacon, ham, sausage, hotdogs, corned beef, chipped beef, packaged lunch meats, salt pork, jerky, pickled herring, anchovies, regular  canned tuna, sardines, salted nuts. Dairy Processed cheese and cheese spreads. Cheese curds. Blue cheese. Feta cheese. String cheese. Regular cottage cheese. Buttermilk. Canned milk. Fats and oils Salted butter. Regular margarine. Ghee. Bacon fat. Seasonings and other foods Onion salt, garlic salt, seasoned salt, table salt, and sea salt.  Canned and packaged gravies. Worcestershire sauce. Tartar sauce. Barbecue sauce. Teriyaki sauce. Soy sauce, including reduced-sodium. Steak sauce. Fish sauce. Oyster sauce. Cocktail sauce. Horseradish that you find on the shelf. Regular ketchup and mustard. Meat flavorings and tenderizers. Bouillon cubes. Hot sauce and Tabasco sauce. Premade or packaged marinades. Premade or packaged taco seasonings. Relishes. Regular salad dressings. Salsa. Potato and tortilla chips. Corn chips and puffs. Salted popcorn and pretzels. Canned or dried soups. Pizza. Frozen entrees and pot pies. Summary  Eating less sodium can help lower your blood pressure, reduce swelling, and protect your heart, liver, and kidneys.  Most people on this plan should limit their sodium intake to 1,500-2,000 mg (milligrams) of sodium each day.  Canned, boxed, and frozen foods are high in sodium. Restaurant foods, fast foods, and pizza are also very high in sodium. You also get sodium by adding salt to food.  Try to cook at home, eat more fresh fruits and vegetables, and eat less fast food, canned, processed, or prepared foods. This information is not intended to replace advice given to you by your health care provider. Make sure you discuss any questions you have with your health care provider. Document Released: 09/05/2001 Document Revised: 03/09/2016 Document Reviewed: 03/09/2016 Elsevier Interactive Patient Education  2018 [place], Daune Perch, NP  03/09/2018 1:06 PM    [facility]

## 2018-03-09 NOTE — Patient Instructions (Addendum)
Medication Instructions:  STOP: Olmersartan  START: Amlodipine 10 MG daily  INCREASE: Lasix to 60 MG daily   If you need a refill on your cardiac medications before your next appointment, please call your pharmacy.   Lab work: FUTURE: BMET on 03/11/18  If you have labs (blood work) drawn today and your tests are completely normal, you will receive your results only by: Marland Kitchen MyChart Message (if you have MyChart) OR . A paper copy in the mail If you have any lab test that is abnormal or we need to change your treatment, we will call you to review the results.  Testing/Procedures: None   Follow-Up: You are scheduled to see Pecolia Ades NP on 03/31/17 @ 11:00 AM  You have been referred to see a nutritionist   Any Other Special Instructions Will Be Listed Below (If Applicable).   Low-Sodium Eating Plan Sodium, which is an element that makes up salt, helps you maintain a healthy balance of fluids in your body. Too much sodium can increase your blood pressure and cause fluid and waste to be held in your body. Your health care provider or dietitian may recommend following this plan if you have high blood pressure (hypertension), kidney disease, liver disease, or heart failure. Eating less sodium can help lower your blood pressure, reduce swelling, and protect your heart, liver, and kidneys. What are tips for following this plan? General guidelines  Most people on this plan should limit their sodium intake to 1,500-2,000 mg (milligrams) of sodium each day. Reading food labels  The Nutrition Facts label lists the amount of sodium in one serving of the food. If you eat more than one serving, you must multiply the listed amount of sodium by the number of servings.  Choose foods with less than 140 mg of sodium per serving.  Avoid foods with 300 mg of sodium or more per serving. Shopping  Look for lower-sodium products, often labeled as "low-sodium" or "no salt added."  Always check the  sodium content even if foods are labeled as "unsalted" or "no salt added".  Buy fresh foods. ? Avoid canned foods and premade or frozen meals. ? Avoid canned, cured, or processed meats  Buy breads that have less than 80 mg of sodium per slice. Cooking  Eat more home-cooked food and less restaurant, buffet, and fast food.  Avoid adding salt when cooking. Use salt-free seasonings or herbs instead of table salt or sea salt. Check with your health care provider or pharmacist before using salt substitutes.  Cook with plant-based oils, such as canola, sunflower, or olive oil. Meal planning  When eating at a restaurant, ask that your food be prepared with less salt or no salt, if possible.  Avoid foods that contain MSG (monosodium glutamate). MSG is sometimes added to [place] food, bouillon, and some canned foods. What foods are recommended? The items listed may not be a complete list. Talk with your dietitian about what dietary choices are best for you. Grains Low-sodium cereals, including oats, puffed wheat and rice, and shredded wheat. Low-sodium crackers. Unsalted rice. Unsalted pasta. Low-sodium bread. Whole-grain breads and whole-grain pasta. Vegetables Fresh or frozen vegetables. "No salt added" canned vegetables. "No salt added" tomato sauce and paste. Low-sodium or reduced-sodium tomato and vegetable juice. Fruits Fresh, frozen, or canned fruit. Fruit juice. Meats and other protein foods Fresh or frozen (no salt added) meat, poultry, seafood, and fish. Low-sodium canned tuna and salmon. Unsalted nuts. Dried peas, beans, and lentils without added salt. Unsalted  canned beans. Eggs. Unsalted nut butters. Dairy Milk. Soy milk. Cheese that is naturally low in sodium, such as ricotta cheese, fresh mozzarella, or Swiss cheese Low-sodium or reduced-sodium cheese. Cream cheese. Yogurt. Fats and oils Unsalted butter. Unsalted margarine with no trans fat. Vegetable oils such as canola or olive  oils. Seasonings and other foods Fresh and dried herbs and spices. Salt-free seasonings. Low-sodium mustard and ketchup. Sodium-free salad dressing. Sodium-free light mayonnaise. Fresh or refrigerated horseradish. Lemon juice. Vinegar. Homemade, reduced-sodium, or low-sodium soups. Unsalted popcorn and pretzels. Low-salt or salt-free chips. What foods are not recommended? The items listed may not be a complete list. Talk with your dietitian about what dietary choices are best for you. Grains Instant hot cereals. Bread stuffing, pancake, and biscuit mixes. Croutons. Seasoned rice or pasta mixes. Noodle soup cups. Boxed or frozen macaroni and cheese. Regular salted crackers. Self-rising flour. Vegetables Sauerkraut, pickled vegetables, and relishes. Olives. [place] fries. Onion rings. Regular canned vegetables (not low-sodium or reduced-sodium). Regular canned tomato sauce and paste (not low-sodium or reduced-sodium). Regular tomato and vegetable juice (not low-sodium or reduced-sodium). Frozen vegetables in sauces. Meats and other protein foods Meat or fish that is salted, canned, smoked, spiced, or pickled. Bacon, ham, sausage, hotdogs, corned beef, chipped beef, packaged lunch meats, salt pork, jerky, pickled herring, anchovies, regular canned tuna, sardines, salted nuts. Dairy Processed cheese and cheese spreads. Cheese curds. Blue cheese. Feta cheese. String cheese. Regular cottage cheese. Buttermilk. Canned milk. Fats and oils Salted butter. Regular margarine. Ghee. Bacon fat. Seasonings and other foods Onion salt, garlic salt, seasoned salt, table salt, and sea salt. Canned and packaged gravies. Worcestershire sauce. Tartar sauce. Barbecue sauce. Teriyaki sauce. Soy sauce, including reduced-sodium. Steak sauce. Fish sauce. Oyster sauce. Cocktail sauce. Horseradish that you find on the shelf. Regular ketchup and mustard. Meat flavorings and tenderizers. Bouillon cubes. Hot sauce and Tabasco sauce.  Premade or packaged marinades. Premade or packaged taco seasonings. Relishes. Regular salad dressings. Salsa. Potato and tortilla chips. Corn chips and puffs. Salted popcorn and pretzels. Canned or dried soups. Pizza. Frozen entrees and pot pies. Summary  Eating less sodium can help lower your blood pressure, reduce swelling, and protect your heart, liver, and kidneys.  Most people on this plan should limit their sodium intake to 1,500-2,000 mg (milligrams) of sodium each day.  Canned, boxed, and frozen foods are high in sodium. Restaurant foods, fast foods, and pizza are also very high in sodium. You also get sodium by adding salt to food.  Try to cook at home, eat more fresh fruits and vegetables, and eat less fast food, canned, processed, or prepared foods. This information is not intended to replace advice given to you by your health care provider. Make sure you discuss any questions you have with your health care provider. Document Released: 09/05/2001 Document Revised: 03/09/2016 Document Reviewed: 03/09/2016 Elsevier Interactive Patient Education  [employer].

## 2018-03-11 ENCOUNTER — Other Ambulatory Visit: Payer: Medicare Other | Admitting: *Deleted

## 2018-03-11 DIAGNOSIS — I10 Essential (primary) hypertension: Secondary | ICD-10-CM | POA: Diagnosis not present

## 2018-03-11 LAB — BASIC METABOLIC PANEL
BUN / CREAT RATIO: 13 (ref 10–24)
BUN: 18 mg/dL (ref 8–27)
CO2: 24 mmol/L (ref 20–29)
Calcium: 9.5 mg/dL (ref 8.6–10.2)
Chloride: 102 mmol/L (ref 96–106)
Creatinine, Ser: 1.43 mg/dL — ABNORMAL HIGH (ref 0.76–1.27)
GFR calc Af Amer: 52 mL/min/{1.73_m2} — ABNORMAL LOW (ref >59)
GFR calc non Af Amer: 45 mL/min/{1.73_m2} — ABNORMAL LOW (ref >59)
Glucose: 112 mg/dL — ABNORMAL HIGH (ref 65–99)
Potassium: 4.5 mmol/L (ref 3.5–5.2)
Sodium: 141 mmol/L (ref 134–144)

## 2018-03-11 NOTE — Telephone Encounter (Signed)
Pt saw Daune Perch, NP on 03/09/18

## 2018-03-15 ENCOUNTER — Other Ambulatory Visit: Payer: Self-pay | Admitting: Interventional Cardiology

## 2018-03-31 ENCOUNTER — Encounter: Payer: Self-pay | Admitting: Cardiology

## 2018-03-31 ENCOUNTER — Ambulatory Visit: Payer: Medicare Other | Admitting: Cardiology

## 2018-03-31 VITALS — BP 132/68 | HR 63 | Ht 72.0 in | Wt 219.4 lb

## 2018-03-31 DIAGNOSIS — I10 Essential (primary) hypertension: Secondary | ICD-10-CM

## 2018-03-31 DIAGNOSIS — N179 Acute kidney failure, unspecified: Secondary | ICD-10-CM

## 2018-03-31 DIAGNOSIS — Z952 Presence of prosthetic heart valve: Secondary | ICD-10-CM | POA: Diagnosis not present

## 2018-03-31 DIAGNOSIS — I5043 Acute on chronic combined systolic (congestive) and diastolic (congestive) heart failure: Secondary | ICD-10-CM | POA: Diagnosis not present

## 2018-03-31 LAB — BASIC METABOLIC PANEL
BUN/Creatinine Ratio: 14 (ref 10–24)
BUN: 20 mg/dL (ref 8–27)
CO2: 24 mmol/L (ref 20–29)
Calcium: 9.6 mg/dL (ref 8.6–10.2)
Chloride: 102 mmol/L (ref 96–106)
Creatinine, Ser: 1.44 mg/dL — ABNORMAL HIGH (ref 0.76–1.27)
GFR calc Af Amer: 52 mL/min/{1.73_m2} — ABNORMAL LOW (ref >59)
GFR, EST NON AFRICAN AMERICAN: 45 mL/min/{1.73_m2} — AB (ref >59)
Glucose: 84 mg/dL (ref 65–99)
Potassium: 4.3 mmol/L (ref 3.5–5.2)
Sodium: 141 mmol/L (ref 134–144)

## 2018-03-31 NOTE — Patient Instructions (Signed)
Medication Instructions:  Your physician recommends that you continue on your current medications as directed. Please refer to the Current Medication list given to you today.  If you need a refill on your cardiac medications before your next appointment, please call your pharmacy.   Lab work: TODAY: BMET  If you have labs (blood work) drawn today and your tests are completely normal, you will receive your results only by: Marland Kitchen MyChart Message (if you have MyChart) OR . A paper copy in the mail If you have any lab test that is abnormal or we need to change your treatment, we will call you to review the results.  Testing/Procedures: None  Follow-Up: You are scheduled to see Dr. [name] on 04/13/18 @ 2:00 PM  Any Other Special Instructions Will Be Listed Below (If Applicable).  You are doing very well. We will check your kidney function today. Continue with your exercise, building up to 30 minutes per day.

## 2018-03-31 NOTE — Progress Notes (Signed)
Cardiology Office Note:    Date:  03/31/2018   ID:  [name], DOB 07/25/1933, MRN [id number]  PCP:  [name], MD  Cardiologist:  Sinclair Grooms, MD  Referring MD: [name], MD   Chief Complaint  Patient presents with  . Follow-up    CHF   History of Present Illness:    Robert Horn is a 85 y.o. male with a past medical history significant for chronic combined S/D CHF, HTN, HLD, colon cancerand severe aortic stenosis s/p TAVR (12/15/16).  EF was down, 25-30, in setting of severe aortic stenosis.  About a month after TAVR echocardiogram showed improvement in EF to 45-50% with mean gradient of 12 mmmHg.   He was admitted to the hospital in September for newly diagnosed colon cancer requiring partial colectomy.  Echo during that admission showed EF 35-40% with normally functioning TAVR.   He was last seen in the office on 02/03/2018 by [name], PA.  With reduced EF he was continued on Lasix 40 mg daily, low-dose Coreg unable to titrate further due to sinus bradycardia.  He was started on losartan 100 mg daily.  Robert Horn went to the ED on 03/06/2018 with acute onset of shortness of breath while watching football.  Chest x-ray was concerning for mild interstitial edema.  D-dimer was elevated but the chest CT was negative for PE, did show pleural effusions and CHF.  BNP was 633.  Troponins were negative x2.  He was given IV Lasix and feeling back to his baseline so he was discharged home.  His Lasix was increased to twice daily for 3 days and then back to once daily.  Serum creatinine was elevated at 1.78. K+ 4.3.  I saw Robert Horn for close hospital follow-up on 03/09/2018.  He was breathing better and fluid status looked better.  He was continued on Lasix 40 mg daily.  It was discovered that he had been taking both olmesartan and losartan.  The olmesartan was discontinued.  Follow-up labs on 03/11/2018 showed improved renal function with serum creatinine down to 1.43  from 1.78.  Potassium was normal at 4.5.  The patient and his wife were given heart failure instructions.  His wife was going to reduce his sodium intake.  Robert Horn is here today for close follow up alone. He is feeling much better. He is up 15 minutes of walking outside and also jogs for about 100 yards. His breathing is good and he has no LE edema. His wife has cut down on the salt in his food. He has been limiting his liquid intake. No chest discomfort, orthopnea, palpitations, lightheadedness or syncope.   Home wts have been within a pound or 2.   Past Medical History:  Diagnosis Date  . CHF (congestive heart failure) ([place])   . Dyslipidemia 10/27/2015  . Dyspnea    w/ exertion   . Elevated PSA 10/27/2015  . Erectile dysfunction 10/27/2015  . Heart murmur   . Hypertension 10/27/2015  . Hypogonadism male 10/27/2015  . Obesity 10/27/2015  . Pneumonia 10/27/2015   pt states was 1982  . Rotator cuff tear 10/27/2015    Past Surgical History:  Procedure Laterality Date  . CARDIAC CATHETERIZATION N/A 11/14/2015   Procedure: Left Heart Cath and Coronary Angiography;  Surgeon: [name], MD;  Location: [facility];  Service: Cardiovascular;  Laterality: N/A;  . LAPAROSCOPIC PARTIAL COLECTOMY  12/15/2017   LAPAROSCOPIC PARTIAL COLECTOMY (N/A Abdomen)  . LAPAROSCOPIC PARTIAL COLECTOMY  N/A 12/15/2017   Procedure: LAPAROSCOPIC PARTIAL COLECTOMY;  Surgeon: Erroll Luna, MD;  Location: [facility];  Service: General;  Laterality: N/A;  . TEE WITHOUT CARDIOVERSION N/A 12/15/2016   Procedure: TRANSESOPHAGEAL ECHOCARDIOGRAM (TEE);  Surgeon: Sherren Mocha, MD;  Location: [facility];  Service: Open Heart Surgery;  Laterality: N/A;  . TRANSCATHETER AORTIC VALVE REPLACEMENT, TRANSFEMORAL N/A 12/15/2016   Procedure: TRANSCATHETER AORTIC VALVE REPLACEMENT, TRANSFEMORAL;  Surgeon: Sherren Mocha, MD;  Location: [facility];  Service: Open Heart Surgery;  Laterality: N/A;    Current Medications: Current  Meds  Medication Sig  . amLODipine (NORVASC) 10 MG tablet Take 1 tablet (10 mg total) by mouth daily.  Marland Kitchen aspirin (ASPIRIN EC) 81 MG EC tablet Take 81 mg by mouth daily. Swallow whole.  . carvedilol (COREG) 3.125 MG tablet Take 1 tablet (3.125 mg total) by mouth 2 (two) times daily.  . ferrous sulfate 325 (65 FE) MG tablet Take 325 mg by mouth daily with breakfast.  . furosemide (LASIX) 40 MG tablet Take 1.5 tablets (60 mg total) by mouth daily.  . hydrALAZINE (APRESOLINE) 25 MG tablet TAKE 1 TABLET BY MOUTH EVERY 8 HOURS  . losartan (COZAAR) 100 MG tablet Take 1 tablet (100 mg total) by mouth daily.  . Multiple Vitamin (MULTIVITAMIN WITH MINERALS) TABS tablet Take 1 tablet by mouth daily.  Marland Kitchen omega-3 acid ethyl esters (LOVAZA) 1 g capsule TAKE ONE CAPSULE BY MOUTH EVERY DAY  . VITAMIN B COMPLEX-C PO Take 1 tablet by mouth daily.     Allergies:   Patient has no known allergies.   Social History   Socioeconomic History  . Marital status: Single    Spouse name: Not on file  . Number of children: Not on file  . Years of education: Not on file  . Highest education level: Not on file  Occupational History  . Not on file  Social Needs  . Financial resource strain: Not on file  . Food insecurity:    Worry: Not on file    Inability: Not on file  . Transportation needs:    Medical: Not on file    Non-medical: Not on file  Tobacco Use  . Smoking status: Former Smoker    Types: Cigarettes  . Smokeless tobacco: Never Used  Substance and Sexual Activity  . Alcohol use: Yes    Comment: rarely  . Drug use: No  . Sexual activity: Not on file  Lifestyle  . Physical activity:    Days per week: Not on file    Minutes per session: Not on file  . Stress: Not on file  Relationships  . Social connections:    Talks on phone: Not on file    Gets together: Not on file    Attends religious service: Not on file    Active member of club or organization: Not on file    Attends meetings of clubs  or organizations: Not on file    Relationship status: Not on file  Other Topics Concern  . Not on file  Social History Narrative  . Not on file     Family History: The patient's family history includes Diabetes in his mother; Heart disease in his mother; Pulmonary embolism in his father. ROS:   Please see the history of present illness.     All other systems reviewed and are negative.  EKGs/Labs/Other Studies Reviewed:    The following studies were reviewed today:  Echocardiogram 12/10/2017 Study Conclusions - Left ventricle: The cavity size  was normal. Wall thickness was normal. Systolic function was moderately reduced. The estimated ejection fraction was in the range of 35% to 40%. Diffuse hypokinesis. Doppler parameters are consistent with abnormal left ventricular relaxation (grade 1 diastolic dysfunction). No evidence of thrombus. - Aortic valve: A TAVR stent-valve bioprosthesis was present and functioning normally. Valve area (VTI): 1.31 cm^2. Valve area (Vmax): 1.31 cm^2. Valve area (Vmean): 1.34 cm^2. - Left atrium: The atrium was moderately dilated. - Atrial septum: No defect or patent foramen ovale was identified.  Impressions: - LV EF is lower than reported on the last study from October 2018, but direct image comarison shows similar findings. TAVR gradients are higher than in October 2018, but similar to those reported in September 2018.  Echocardiogram 01/07/2017 Study Conclusions - Left ventricle: The cavity size was mildly dilated. Systolic function was mildly reduced. The estimated ejection fraction was in the range of 45% to 50%. Wall motion was normal; there were no regional wall motion abnormalities. Features are consistent with a pseudonormal left ventricular filling pattern, with concomitant abnormal relaxation and increased filling pressure (grade 2 diastolic dysfunction). - Aortic valve: A stent-valve (TAVR)  bioprosthesis was present and functioning normally. Valve area (VTI): 1.41 cm^2. - Left atrium: The atrium was moderately to severely dilated.  Impressions: - Slight increase in TAVR gradients noted.   EKG:  EKG is not ordered today.   Recent Labs: 12/11/2017: ALT 11 12/12/2017: Magnesium 2.3 12/16/2017: TSH 0.505 03/06/2018: B Natriuretic Peptide 633.8; Hemoglobin 11.1; Platelets 163 03/31/2018: BUN 20; Creatinine, Ser 1.44; Potassium 4.3; Sodium 141   Recent Lipid Panel No results found for: CHOL, TRIG, HDL, CHOLHDL, VLDL, LDLCALC, LDLDIRECT  Physical Exam:    VS:  BP 132/68   Pulse 63   Ht 6' (1.829 m)   Wt 219 lb 6.4 oz (99.5 kg)   SpO2 98%   BMI 29.76 kg/m     Wt Readings from Last 3 Encounters:  03/31/18 219 lb 6.4 oz (99.5 kg)  03/09/18 219 lb 12.8 oz (99.7 kg)  03/06/18 22 lb (9.979 kg)     Physical Exam  Constitutional: He is oriented to person, place, and time. He appears well-developed and well-nourished. No distress.  HENT:  Head: Normocephalic and atraumatic.  Neck: Normal range of motion. Neck supple. No JVD present.  Cardiovascular: Normal rate, regular rhythm, normal heart sounds and intact distal pulses. Exam reveals no gallop and no friction rub.  No murmur heard. Pulmonary/Chest: Effort normal and breath sounds normal. No respiratory distress. He has no wheezes. He has no rales.  Abdominal: Soft. Bowel sounds are normal.  Musculoskeletal: Normal range of motion.        General: No deformity or edema.  Neurological: He is alert and oriented to person, place, and time.  Skin: Skin is warm and dry.  Psychiatric: He has a normal mood and affect. His behavior is normal. Judgment and thought content normal.  Vitals reviewed.    ASSESSMENT:    1. Acute on chronic combined systolic and diastolic CHF (congestive heart failure) ([place])   2. AKI (acute kidney injury) ([place])   3. S/P TAVR (transcatheter aortic valve replacement)   4. Essential hypertension     PLAN:    In order of problems listed above:  1.  Acute on chronic combined systolic and diastolic heart failure -EF down to 35-40% by echo in 12/17/2017. -Now on Lasix 60 mg daily.  He continues on low-dose carvedilol (unable to titrate due to bradycardia) and ARB -  Low-sodium diet -Patient is doing very well, no edema or shortness of breath.  He is walking regularly and even has attempted some short jogs. -Continue current therapy -The patient has a prearranged appointment with Dr. [name], I offered to move that out 3 months but he wishes to keep this appointment.  2.  AKI -Creatinine improved to 1.43 from 1.78. -Follow-up [facility] on his increased dose of Lasix  3.  S/p TAVR 12/05/2016 -Followed by TAVR clinic  4.  Hypertension -blood pressure is well controlled  5.  Colon cancer -S/P resection.  Followed by Dr. Benay Spice with good prognosis for long-term survival.  Patient says he is doing well.   Medication Adjustments/Labs and Tests Ordered: Current medicines are reviewed at length with the patient today.  Concerns regarding medicines are outlined above. Labs and tests ordered and medication changes are outlined in the patient instructions below:  Patient Instructions  Medication Instructions:  Your physician recommends that you continue on your current medications as directed. Please refer to the Current Medication list given to you today.  If you need a refill on your cardiac medications before your next appointment, please call your pharmacy.   Lab work: TODAY: BMET  If you have labs (blood work) drawn today and your tests are completely normal, you will receive your results only by: Marland Kitchen MyChart Message (if you have MyChart) OR . A paper copy in the mail If you have any lab test that is abnormal or we need to change your treatment, we will call you to review the results.  Testing/Procedures: None  Follow-Up: You are scheduled to see Dr. [name] on 04/13/18 @ 2:00 PM  Any  Other Special Instructions Will Be Listed Below (If Applicable).  You are doing very well. We will check your kidney function today. Continue with your exercise, building up to 30 minutes per day.       Signed, Daune Perch, NP  03/31/2018 [place]

## 2018-04-08 ENCOUNTER — Ambulatory Visit: Payer: Medicare Other | Admitting: *Deleted

## 2018-04-12 NOTE — Progress Notes (Signed)
Cardiology Office Note:    Date:  04/13/2018   ID:  Robert Horn, DOB 12-Jan-1934, MRN [id number]  PCP:  [name], MD  Cardiologist:  Sinclair Grooms, MD   Referring MD: [name], MD   Chief Complaint  Patient presents with  . Congestive Heart Failure  . Cardiac Valve Problem    History of Present Illness:    Robert Horn is a 85 y.o. male with a hxof chronic combined S/D CHF, HTN, HLD, colon cancerand severe aortic stenosis s/p TAVR (12/15/16).EF was down, 25-30,in setting of severe aortic stenosis. About a monthafter TAVR echocardiogram showed improvement in EF to 45-50% with mean gradient of 12 mmmHg. Most recent LVEF 35 to 40%.  Recent episode of acute dyspnea requiring emergency room visit (?  Ventricular versus supraventricular arrhythmia -no arrhythmia documented)  Past Medical History:  Diagnosis Date  . CHF (congestive heart failure) ([place])   . Dyslipidemia 10/27/2015  . Dyspnea    w/ exertion   . Elevated PSA 10/27/2015  . Erectile dysfunction 10/27/2015  . Heart murmur   . Hypertension 10/27/2015  . Hypogonadism male 10/27/2015  . Obesity 10/27/2015  . Pneumonia 10/27/2015   pt states was 1982  . Rotator cuff tear 10/27/2015    Past Surgical History:  Procedure Laterality Date  . CARDIAC CATHETERIZATION N/A 11/14/2015   Procedure: Left Heart Cath and Coronary Angiography;  Surgeon: [name], MD;  Location: [facility];  Service: Cardiovascular;  Laterality: N/A;  . LAPAROSCOPIC PARTIAL COLECTOMY  12/15/2017   LAPAROSCOPIC PARTIAL COLECTOMY (N/A Abdomen)  . LAPAROSCOPIC PARTIAL COLECTOMY N/A 12/15/2017   Procedure: LAPAROSCOPIC PARTIAL COLECTOMY;  Surgeon: Erroll Luna, MD;  Location: [facility];  Service: General;  Laterality: N/A;  . TEE WITHOUT CARDIOVERSION N/A 12/15/2016   Procedure: TRANSESOPHAGEAL ECHOCARDIOGRAM (TEE);  Surgeon: Sherren Mocha, MD;  Location: [facility];  Service: Open Heart Surgery;  Laterality: N/A;  .  TRANSCATHETER AORTIC VALVE REPLACEMENT, TRANSFEMORAL N/A 12/15/2016   Procedure: TRANSCATHETER AORTIC VALVE REPLACEMENT, TRANSFEMORAL;  Surgeon: Sherren Mocha, MD;  Location: [facility];  Service: Open Heart Surgery;  Laterality: N/A;    Current Medications: Current Meds  Medication Sig  . amLODipine (NORVASC) 10 MG tablet Take 1 tablet (10 mg total) by mouth daily.  Marland Kitchen aspirin (ASPIRIN EC) 81 MG EC tablet Take 81 mg by mouth daily. Swallow whole.  . carvedilol (COREG) 6.25 MG tablet Take 1 tablet (6.25 mg total) by mouth 2 (two) times daily.  . ferrous sulfate 325 (65 FE) MG tablet Take 325 mg by mouth daily with breakfast.  . furosemide (LASIX) 40 MG tablet Take 1.5 tablets (60 mg total) by mouth daily.  . hydrALAZINE (APRESOLINE) 25 MG tablet TAKE 1 TABLET BY MOUTH EVERY 8 HOURS  . losartan (COZAAR) 100 MG tablet Take 1 tablet (100 mg total) by mouth daily.  . Multiple Vitamin (MULTIVITAMIN WITH MINERALS) TABS tablet Take 1 tablet by mouth daily.  Marland Kitchen omega-3 acid ethyl esters (LOVAZA) 1 g capsule TAKE ONE CAPSULE BY MOUTH EVERY DAY  . VITAMIN B COMPLEX-C PO Take 1 tablet by mouth daily.  . [DISCONTINUED] carvedilol (COREG) 3.125 MG tablet Take 1 tablet (3.125 mg total) by mouth 2 (two) times daily.     Allergies:   Patient has no known allergies.   Social History   Socioeconomic History  . Marital status: Single    Spouse name: Not on file  . Number of children: Not on file  . Years of education:  Not on file  . Highest education level: Not on file  Occupational History  . Not on file  Social Needs  . Financial resource strain: Not on file  . Food insecurity:    Worry: Not on file    Inability: Not on file  . Transportation needs:    Medical: Not on file    Non-medical: Not on file  Tobacco Use  . Smoking status: Former Smoker    Types: Cigarettes  . Smokeless tobacco: Never Used  Substance and Sexual Activity  . Alcohol use: Yes    Comment: rarely  . Drug use: No  .  Sexual activity: Not on file  Lifestyle  . Physical activity:    Days per week: Not on file    Minutes per session: Not on file  . Stress: Not on file  Relationships  . Social connections:    Talks on phone: Not on file    Gets together: Not on file    Attends religious service: Not on file    Active member of club or organization: Not on file    Attends meetings of clubs or organizations: Not on file    Relationship status: Not on file  Other Topics Concern  . Not on file  Social History Narrative  . Not on file     Family History: The patient's family history includes Diabetes in his mother; Heart disease in his mother; Pulmonary embolism in his father.  ROS:   Please see the history of present illness.    Symptoms of dyspnea.  None since diuretic adjustment in December/January.  All other systems reviewed and are negative.  EKGs/Labs/Other Studies Reviewed:    The following studies were reviewed today:  2D Doppler echocardiogram September 2019: Study Conclusions  - Left ventricle: The cavity size was normal. Wall thickness was   normal. Systolic function was moderately reduced. The estimated   ejection fraction was in the range of 35% to 40%. Diffuse   hypokinesis. Doppler parameters are consistent with abnormal left   ventricular relaxation (grade 1 diastolic dysfunction). No   evidence of thrombus. - Aortic valve: A TAVR stent-valve bioprosthesis was present and   functioning normally. Valve area (VTI): 1.31 cm^2. Valve area   (Vmax): 1.31 cm^2. Valve area (Vmean): 1.34 cm^2. - Left atrium: The atrium was moderately dilated. - Atrial septum: No defect or patent foramen ovale was identified.  Impressions:  - LV EF is lower than reported on the last study from October 2018,   but direct image comarison shows similar findings. TAVR gradients   are higher than in October 2018, but similar to those reported in   September 2018.  EKG:  EKG is repeated on  today's office visit.  Recent Labs: 12/11/2017: ALT 11 12/12/2017: Magnesium 2.3 12/16/2017: TSH 0.505 03/06/2018: B Natriuretic Peptide 633.8; Hemoglobin 11.1; Platelets 163 03/31/2018: BUN 20; Creatinine, Ser 1.44; Potassium 4.3; Sodium 141  Recent Lipid Panel No results found for: CHOL, TRIG, HDL, CHOLHDL, VLDL, LDLCALC, LDLDIRECT  Physical Exam:    VS:  BP 136/82   Pulse 60   Ht 6' (1.829 m)   Wt 224 lb 9.6 oz (101.9 kg)   SpO2 97%   BMI 30.46 kg/m     Wt Readings from Last 3 Encounters:  04/13/18 224 lb 9.6 oz (101.9 kg)  03/31/18 219 lb 6.4 oz (99.5 kg)  03/09/18 219 lb 12.8 oz (99.7 kg)     GEN: Moderate obesity.  Appears younger than stated  age.. No acute distress HEENT: Normal NECK: No JVD. LYMPHATICS: No lymphadenopathy CARDIAC: RRR.  1/6 to 2/6 systolic right upper sternal border murmur.  No diastolic murmur.  An S4 gallop but no S3 or edema. VASCULAR: Pulses are 2+ and symmetric in the radial and carotid., Bruits are absent in the carotids. RESPIRATORY:  Clear to auscultation without rales, wheezing or rhonchi  ABDOMEN: Soft, non-tender, non-distended, No pulsatile mass, MUSCULOSKELETAL: No deformity  SKIN: Warm and dry NEUROLOGIC:  Alert and oriented x 3 PSYCHIATRIC:  Normal affect   ASSESSMENT:    1. S/P TAVR (transcatheter aortic valve replacement)   2. Chronic combined systolic and diastolic heart failure ([place])   3. Essential hypertension   4. Malignant neoplasm of colon, unspecified part of colon ([place])   5. PVC's (premature ventricular contractions)   6. CKD (chronic kidney disease) stage 2, GFR 60-89 ml/min    PLAN:    In order of problems listed above:  1. Normally functioning aortic valve bioprosthesis placed with TAVI. 2. Chronic combined systolic and diastolic heart failure with intermittent episodes of acute volume overload related to dietary indiscretion and volume overload.  I agree with recent up titration of diuretic therapy from furosemide  40 mg/day to 60 mg/day.  Increase carvedilol to 6.25 mg p.o. twice daily.  I will see him again in approximately 6 weeks.  If stable and kidney function allows will transition from [facility] therapy to [facility].  Will notify us if any recurrence of dyspnea. 3. Blood pressure is adequate currently.  We discussed fluid intake and sodium restriction.  Fluid intake should be less than 1500 cc/day.  2 g sodium diet as discussed. 4. Not addressed 5. Not addressed 6. Kidney function will be reassessed on the next office visit.  Greater than 50% of the time during this office visit was spent in education, counseling, and coordination of care related to underlying disease process and testing as outlined.  Guideline directed therapy for left ventricular systolic dysfunction: Angiotensin receptor-neprilysin inhibitor (ARNI)-Entresto; beta-blocker therapy - carvedilol or metoprolol succinate; mineralocorticoid receptor antagonist (MRA) therapy -spironolactone or eplerenone.  These therapies have been shown to improve clinical outcomes including reduction of rehospitalization survival, and acute heart failure.    Medication Adjustments/Labs and Tests Ordered: Current medicines are reviewed at length with the patient today.  Concerns regarding medicines are outlined above.  No orders of the defined types were placed in this encounter.  Meds ordered this encounter  Medications  . carvedilol (COREG) 6.25 MG tablet    Sig: Take 1 tablet (6.25 mg total) by mouth 2 (two) times daily.    Dispense:  180 tablet    Refill:  3    Patient Instructions  Medication Instructions:  Your physician has recommended you make the following change in your medication:   INCREASE: carvedilol (coreg) to 6.25 mg by mouth twice a day   If you need a refill on your cardiac medications before your next appointment, please call your pharmacy.   Lab work: None ordered If you have labs (blood work) drawn today and your tests are  completely normal, you will receive your results only by: Marland Kitchen MyChart Message (if you have MyChart) OR . A paper copy in the mail If you have any lab test that is abnormal or we need to change your treatment, we will call you to review the results.  Testing/Procedures: None ordered  Follow-Up: . Your physician recommends that you schedule a follow-up appointment in: 1 MONTH with Dr.   (EKG to be done at this visit) .    Any Other Special Instructions Will Be Listed Below (If Applicable).     Signed, Sinclair Grooms, MD  04/13/2018 5:46 PM    [facility]

## 2018-04-13 ENCOUNTER — Encounter: Payer: Self-pay | Admitting: Interventional Cardiology

## 2018-04-13 ENCOUNTER — Ambulatory Visit: Payer: Medicare Other | Admitting: Interventional Cardiology

## 2018-04-13 VITALS — BP 136/82 | HR 60 | Ht 72.0 in | Wt 224.6 lb

## 2018-04-13 DIAGNOSIS — I493 Ventricular premature depolarization: Secondary | ICD-10-CM | POA: Diagnosis not present

## 2018-04-13 DIAGNOSIS — I5042 Chronic combined systolic (congestive) and diastolic (congestive) heart failure: Secondary | ICD-10-CM | POA: Diagnosis not present

## 2018-04-13 DIAGNOSIS — I10 Essential (primary) hypertension: Secondary | ICD-10-CM | POA: Diagnosis not present

## 2018-04-13 DIAGNOSIS — Z952 Presence of prosthetic heart valve: Secondary | ICD-10-CM | POA: Diagnosis not present

## 2018-04-13 DIAGNOSIS — C189 Malignant neoplasm of colon, unspecified: Secondary | ICD-10-CM | POA: Diagnosis not present

## 2018-04-13 DIAGNOSIS — N182 Chronic kidney disease, stage 2 (mild): Secondary | ICD-10-CM

## 2018-04-13 MED ORDER — CARVEDILOL 6.25 MG PO TABS: 6.2500 mg | ORAL_TABLET | Freq: Two times a day (BID) | ORAL | 3 refills | Status: DC

## 2018-04-13 NOTE — Patient Instructions (Signed)
Medication Instructions:  Your physician has recommended you make the following change in your medication:   INCREASE: carvedilol (coreg) to 6.25 mg by mouth twice a day   If you need a refill on your cardiac medications before your next appointment, please call your pharmacy.   Lab work: None ordered If you have labs (blood work) drawn today and your tests are completely normal, you will receive your results only by: Marland Kitchen MyChart Message (if you have MyChart) OR . A paper copy in the mail If you have any lab test that is abnormal or we need to change your treatment, we will call you to review the results.  Testing/Procedures: None ordered  Follow-Up: . Your physician recommends that you schedule a follow-up appointment in: 1 MONTH with Dr. [name] (EKG to be done at this visit) .    Any Other Special Instructions Will Be Listed Below (If Applicable).

## 2018-05-25 ENCOUNTER — Encounter: Payer: Self-pay | Admitting: Interventional Cardiology

## 2018-05-25 ENCOUNTER — Ambulatory Visit: Payer: Medicare Other | Admitting: Interventional Cardiology

## 2018-05-25 VITALS — BP 138/72 | HR 61 | Ht 72.0 in | Wt 225.4 lb

## 2018-05-25 DIAGNOSIS — I10 Essential (primary) hypertension: Secondary | ICD-10-CM | POA: Diagnosis not present

## 2018-05-25 DIAGNOSIS — I5042 Chronic combined systolic (congestive) and diastolic (congestive) heart failure: Secondary | ICD-10-CM | POA: Diagnosis not present

## 2018-05-25 DIAGNOSIS — I493 Ventricular premature depolarization: Secondary | ICD-10-CM

## 2018-05-25 DIAGNOSIS — Z952 Presence of prosthetic heart valve: Secondary | ICD-10-CM | POA: Diagnosis not present

## 2018-05-25 DIAGNOSIS — N182 Chronic kidney disease, stage 2 (mild): Secondary | ICD-10-CM | POA: Diagnosis not present

## 2018-05-25 MED ORDER — SACUBITRIL-VALSARTAN 24-26 MG PO TABS: 1.0000 | ORAL_TABLET | Freq: Two times a day (BID) | ORAL | 11 refills | Status: DC

## 2018-05-25 NOTE — Patient Instructions (Addendum)
Medication Instructions:  Your physician has recommended you make the following change in your medication:   1. STOP: losartan  2. START: entresto 24-26 mg tablet by mouth twice a day   Lab work: Your physician recommends that you return for lab work on 06/01/18 for BMET  If you have labs (blood work) drawn today and your tests are completely normal, you will receive your results only by: Marland Kitchen MyChart Message (if you have MyChart) OR . A paper copy in the mail If you have any lab test that is abnormal or we need to change your treatment, we will call you to review the results.  Testing/Procedures: None ordered  Follow-Up: . Follow up with Truitt Merle, NP on 06/01/18 at 11:00 PM   Any Other Special Instructions Will Be Listed Below (If Applicable).

## 2018-05-25 NOTE — Progress Notes (Signed)
Cardiology Office Note:    Date:  05/25/2018   ID:  Robert Horn, DOB 1934-01-04, MRN [id number]  PCP:  [name], MD  Cardiologist:  Sinclair Grooms, MD   Referring MD: [name], MD   Chief Complaint  Patient presents with  . Congestive Heart Failure    History of Present Illness:    Robert Horn is a 85 y.o. male with a hx of chronic combined S/D CHF, HTN, HLD, colon cancerand severe aortic stenosis s/p TAVR (12/15/16).EF was down, 25-30,in setting of severe aortic stenosis. About a monthafter TAVR echocardiogram showed improvement in EF to 45-50% with mean gradient of 12 mmmHg. Most recent LVEF 35 to 40%.  Recent episode of acute dyspnea requiring emergency room visit (?  Ventricular versus supraventricular arrhythmia -no arrhythmia documented)  Had an episode recently of near syncope at home.  It lasted less than 5 minutes.  Breathing is not quite as good as it was.  No angina or lower extremity swelling.  Not exercising.   Past Medical History:  Diagnosis Date  . CHF (congestive heart failure) ([place])   . Dyslipidemia 10/27/2015  . Dyspnea    w/ exertion   . Elevated PSA 10/27/2015  . Erectile dysfunction 10/27/2015  . Heart murmur   . Hypertension 10/27/2015  . Hypogonadism male 10/27/2015  . Obesity 10/27/2015  . Pneumonia 10/27/2015   pt states was 1982  . Rotator cuff tear 10/27/2015    Past Surgical History:  Procedure Laterality Date  . CARDIAC CATHETERIZATION N/A 11/14/2015   Procedure: Left Heart Cath and Coronary Angiography;  Surgeon: [name], MD;  Location: [facility];  Service: Cardiovascular;  Laterality: N/A;  . LAPAROSCOPIC PARTIAL COLECTOMY  12/15/2017   LAPAROSCOPIC PARTIAL COLECTOMY (N/A Abdomen)  . LAPAROSCOPIC PARTIAL COLECTOMY N/A 12/15/2017   Procedure: LAPAROSCOPIC PARTIAL COLECTOMY;  Surgeon: Erroll Luna, MD;  Location: [place] [facility];  Service: General;  Laterality: N/A;  . TEE WITHOUT CARDIOVERSION N/A  12/15/2016   Procedure: TRANSESOPHAGEAL ECHOCARDIOGRAM (TEE);  Surgeon: Sherren Mocha, MD;  Location: [facility];  Service: Open Heart Surgery;  Laterality: N/A;  . TRANSCATHETER AORTIC VALVE REPLACEMENT, TRANSFEMORAL N/A 12/15/2016   Procedure: TRANSCATHETER AORTIC VALVE REPLACEMENT, TRANSFEMORAL;  Surgeon: Sherren Mocha, MD;  Location: [facility];  Service: Open Heart Surgery;  Laterality: N/A;    Current Medications: Current Meds  Medication Sig  . amLODipine (NORVASC) 10 MG tablet Take 1 tablet (10 mg total) by mouth daily.  Marland Kitchen aspirin (ASPIRIN EC) 81 MG EC tablet Take 81 mg by mouth daily. Swallow whole.  . carvedilol (COREG) 3.125 MG tablet Take 3.125 mg by mouth 2 (two) times daily with a meal.  . ferrous sulfate 325 (65 FE) MG tablet Take 325 mg by mouth daily with breakfast.  . furosemide (LASIX) 40 MG tablet Take 1.5 tablets (60 mg total) by mouth daily.  . hydrALAZINE (APRESOLINE) 25 MG tablet TAKE 1 TABLET BY MOUTH EVERY 8 HOURS  . Multiple Vitamin (MULTIVITAMIN WITH MINERALS) TABS tablet Take 1 tablet by mouth daily.  Marland Kitchen omega-3 acid ethyl esters (LOVAZA) 1 g capsule TAKE ONE CAPSULE BY MOUTH EVERY DAY  . VITAMIN B COMPLEX-C PO Take 1 tablet by mouth daily.  . [DISCONTINUED] losartan (COZAAR) 100 MG tablet Take 1 tablet (100 mg total) by mouth daily.     Allergies:   Patient has no known allergies.   Social History   Socioeconomic History  . Marital status: Single    Spouse name: Not  on file  . Number of children: Not on file  . Years of education: Not on file  . Highest education level: Not on file  Occupational History  . Not on file  Social Needs  . Financial resource strain: Not on file  . Food insecurity:    Worry: Not on file    Inability: Not on file  . Transportation needs:    Medical: Not on file    Non-medical: Not on file  Tobacco Use  . Smoking status: Former Smoker    Types: Cigarettes  . Smokeless tobacco: Never Used  Substance and Sexual Activity  .  Alcohol use: Yes    Comment: rarely  . Drug use: No  . Sexual activity: Not on file  Lifestyle  . Physical activity:    Days per week: Not on file    Minutes per session: Not on file  . Stress: Not on file  Relationships  . Social connections:    Talks on phone: Not on file    Gets together: Not on file    Attends religious service: Not on file    Active member of club or organization: Not on file    Attends meetings of clubs or organizations: Not on file    Relationship status: Not on file  Other Topics Concern  . Not on file  Social History Narrative  . Not on file     Family History: The patient's family history includes Diabetes in his mother; Heart disease in his mother; Pulmonary embolism in his father.  ROS:   Please see the history of present illness.    Difficulty with balance and dizziness.  All other systems reviewed and are negative.  EKGs/Labs/Other Studies Reviewed:    The following studies were reviewed today: No new cardiac data  EKG:  EKG sinus rhythm, PR interval 212 ms, normal sinus rhythm with isolated PVCs.  Poor R wave progression V1 through V5.  Recent Labs: 12/11/2017: ALT 11 12/12/2017: Magnesium 2.3 12/16/2017: TSH 0.505 03/06/2018: B Natriuretic Peptide 633.8; Hemoglobin 11.1; Platelets 163 03/31/2018: BUN 20; Creatinine, Ser 1.44; Potassium 4.3; Sodium 141  Recent Lipid Panel No results found for: CHOL, TRIG, HDL, CHOLHDL, VLDL, LDLCALC, LDLDIRECT  Physical Exam:    VS:  BP 138/72   Pulse 61   Ht 6' (1.829 m)   Wt 225 lb 6.4 oz (102.2 kg)   SpO2 96%   BMI 30.57 kg/m     Wt Readings from Last 3 Encounters:  05/25/18 225 lb 6.4 oz (102.2 kg)  04/13/18 224 lb 9.6 oz (101.9 kg)  03/31/18 219 lb 6.4 oz (99.5 kg)     GEN: Bees. No acute distress HEENT: Normal NECK: No JVD. LYMPHATICS: No lymphadenopathy CARDIAC: RRR.  1/6 to 2/6 right upper sternal border crescendo decrescendo systolic murmur, S4 gallop, no edema VASCULAR: 2+ radial  and carotid pulses, no bruits RESPIRATORY:  Clear to auscultation without rales, wheezing or rhonchi  ABDOMEN: Soft, non-tender, non-distended, No pulsatile mass, MUSCULOSKELETAL: No deformity  SKIN: Warm and dry NEUROLOGIC:  Alert and oriented x 3 PSYCHIATRIC:  Normal affect   ASSESSMENT:    1. S/P TAVR (transcatheter aortic valve replacement)   2. Chronic combined systolic and diastolic heart failure ([place])   3. Essential hypertension   4. PVC's (premature ventricular contractions)   5. CKD (chronic kidney disease) stage 2, GFR 60-89 ml/min    PLAN:    In order of problems listed above:  1. Valve function appears normal  2. Discontinue losartan and start Entresto 24/26 mg p.o. twice daily tomorrow evening. 3. Target 130/80 mmHg.  May eventually need to stop hydralazine. 4. Not addressed 5. Will check kidney function in 1 week.   Medication Adjustments/Labs and Tests Ordered: Current medicines are reviewed at length with the patient today.  Concerns regarding medicines are outlined above.  Orders Placed This Encounter  Procedures  . Basic metabolic panel  . EKG 12-Lead   Meds ordered this encounter  Medications  . sacubitril-valsartan (ENTRESTO) 24-26 MG    Sig: Take 1 tablet by mouth 2 (two) times daily.    Dispense:  60 tablet    Refill:  11    Please Honor Card patient is presenting for Carmie Kanner: [id number]; RXGRP: [id number]; ENIDP: [id number]; ISSUER: [id number] ID: [id number]    Patient Instructions  Medication Instructions:  Your physician has recommended you make the following change in your medication:   1. STOP: losartan  2. START: entresto 24-26 mg tablet by mouth twice a day   Lab work: Your physician recommends that you return for lab work on 06/01/18 for BMET  If you have labs (blood work) drawn today and your tests are completely normal, you will receive your results only by: Marland Kitchen MyChart Message (if you have MyChart) OR . A paper copy in the mail If you have  any lab test that is abnormal or we need to change your treatment, we will call you to review the results.  Testing/Procedures: None ordered  Follow-Up: . Follow up with Truitt Merle, NP on 06/01/18 at 11:00 PM   Any Other Special Instructions Will Be Listed Below (If Applicable).     Signed, Sinclair Grooms, MD  05/25/2018 4:25 PM    [facility]

## 2018-06-01 ENCOUNTER — Other Ambulatory Visit: Payer: Self-pay | Admitting: *Deleted

## 2018-06-01 ENCOUNTER — Ambulatory Visit (INDEPENDENT_AMBULATORY_CARE_PROVIDER_SITE_OTHER): Payer: Medicare Other | Admitting: Nurse Practitioner

## 2018-06-01 ENCOUNTER — Other Ambulatory Visit: Payer: Medicare Other | Admitting: *Deleted

## 2018-06-01 ENCOUNTER — Encounter: Payer: Self-pay | Admitting: Nurse Practitioner

## 2018-06-01 ENCOUNTER — Other Ambulatory Visit: Payer: Medicare Other

## 2018-06-01 VITALS — BP 120/70 | HR 69 | Ht 72.0 in | Wt 225.8 lb

## 2018-06-01 DIAGNOSIS — I10 Essential (primary) hypertension: Secondary | ICD-10-CM | POA: Diagnosis not present

## 2018-06-01 DIAGNOSIS — I5042 Chronic combined systolic (congestive) and diastolic (congestive) heart failure: Secondary | ICD-10-CM | POA: Diagnosis not present

## 2018-06-01 DIAGNOSIS — Z952 Presence of prosthetic heart valve: Secondary | ICD-10-CM | POA: Diagnosis not present

## 2018-06-01 DIAGNOSIS — I493 Ventricular premature depolarization: Secondary | ICD-10-CM | POA: Diagnosis not present

## 2018-06-01 DIAGNOSIS — N182 Chronic kidney disease, stage 2 (mild): Secondary | ICD-10-CM | POA: Diagnosis not present

## 2018-06-01 LAB — BASIC METABOLIC PANEL
BUN/Creatinine Ratio: 14 (ref 10–24)
BUN: 20 mg/dL (ref 8–27)
CO2: 24 mmol/L (ref 20–29)
Calcium: 9.9 mg/dL (ref 8.6–10.2)
Chloride: 101 mmol/L (ref 96–106)
Creatinine, Ser: 1.45 mg/dL — ABNORMAL HIGH (ref 0.76–1.27)
GFR calc Af Amer: 51 mL/min/{1.73_m2} — ABNORMAL LOW (ref >59)
GFR calc non Af Amer: 45 mL/min/{1.73_m2} — ABNORMAL LOW (ref >59)
Glucose: 107 mg/dL — ABNORMAL HIGH (ref 65–99)
POTASSIUM: 4.1 mmol/L (ref 3.5–5.2)
Sodium: 140 mmol/L (ref 134–144)

## 2018-06-01 NOTE — Progress Notes (Addendum)
CARDIOLOGY OFFICE NOTE  Date:  06/01/2018    Robert Horn Date of Birth: 01/10/1934 Medical Record #[id number]  PCP:  [name], MD  Cardiologist:  [name]    Chief Complaint  Patient presents with  . Congestive Heart Failure    1 week check - seen for Dr. [name]    History of Present Illness: Robert Horn is a 85 y.o. male who presents today for a one week check. Seen for Dr. [name].   He has a history of chronic combined S/D CHF, HTN, HLD, colon cancerand severe aortic stenosis s/p TAVR (12/15/16).EF was down, 25-30,in setting of severe aortic stenosis. About a monthafter TAVR echocardiogram showed improvement in EF to 45-50% with mean gradient of 12 mmmHg.Most recent LVEF 35 to 40%. Recent episode of acute dyspnea requiring emergency room visit (? Ventricular versus supraventricular arrhythmia -no arrhythmia documented). Had an episode recently of near syncope at home.  It lasted less than 5 minutes.Has not recurred.   Saw Dr. [name] last week - breathing noted to not be as good - Entresto was started. Losartan was stopped. No chest pain noted.   Comes in today. Here alone. Feel good. No more shortness of breath. No swelling. Weight is unchanged. No chest pain. He has a multitude of questions about the Delene Loll - what it is suppose to be doing for him, is it long term, how much longer will he live if he takes it, what happens if he does not take it long term, what do we do since it costs too much, wasn't his Losartan working good enough and if not - why was he on it, why he has CKD listed as a diagnosis for him, says he was never told by Dr. [name] that he had issues with his kidneys, does not want to go on dialysis, etc.. He does not wish to take anything that might hurt his kidneys.    Past Medical History:  Diagnosis Date  . CHF (congestive heart failure) ([place])   . Dyslipidemia 10/27/2015  . Dyspnea    w/ exertion   . Elevated PSA 10/27/2015  . Erectile  dysfunction 10/27/2015  . Heart murmur   . Hypertension 10/27/2015  . Hypogonadism male 10/27/2015  . Obesity 10/27/2015  . Pneumonia 10/27/2015   pt states was 1982  . Rotator cuff tear 10/27/2015    Past Surgical History:  Procedure Laterality Date  . CARDIAC CATHETERIZATION N/A 11/14/2015   Procedure: Left Heart Cath and Coronary Angiography;  Surgeon: [name], MD;  Location: [facility];  Service: Cardiovascular;  Laterality: N/A;  . LAPAROSCOPIC PARTIAL COLECTOMY  12/15/2017   LAPAROSCOPIC PARTIAL COLECTOMY (N/A Abdomen)  . LAPAROSCOPIC PARTIAL COLECTOMY N/A 12/15/2017   Procedure: LAPAROSCOPIC PARTIAL COLECTOMY;  Surgeon: Erroll Luna, MD;  Location: [facility];  Service: General;  Laterality: N/A;  . TEE WITHOUT CARDIOVERSION N/A 12/15/2016   Procedure: TRANSESOPHAGEAL ECHOCARDIOGRAM (TEE);  Surgeon: Sherren Mocha, MD;  Location: [facility];  Service: Open Heart Surgery;  Laterality: N/A;  . TRANSCATHETER AORTIC VALVE REPLACEMENT, TRANSFEMORAL N/A 12/15/2016   Procedure: TRANSCATHETER AORTIC VALVE REPLACEMENT, TRANSFEMORAL;  Surgeon: Sherren Mocha, MD;  Location: [facility];  Service: Open Heart Surgery;  Laterality: N/A;     Medications: Current Meds  Medication Sig  . amLODipine (NORVASC) 10 MG tablet Take 1 tablet (10 mg total) by mouth daily.  Marland Kitchen aspirin (ASPIRIN EC) 81 MG EC tablet Take 81 mg by mouth daily. Swallow whole.  . carvedilol (COREG)  3.125 MG tablet Take 3.125 mg by mouth 2 (two) times daily with a meal.  . ferrous sulfate 325 (65 FE) MG tablet Take 325 mg by mouth daily with breakfast.  . furosemide (LASIX) 40 MG tablet Take 1.5 tablets (60 mg total) by mouth daily.  . hydrALAZINE (APRESOLINE) 25 MG tablet TAKE 1 TABLET BY MOUTH EVERY 8 HOURS  . Multiple Vitamin (MULTIVITAMIN WITH MINERALS) TABS tablet Take 1 tablet by mouth daily.  Marland Kitchen omega-3 acid ethyl esters (LOVAZA) 1 g capsule TAKE ONE CAPSULE BY MOUTH EVERY DAY  . sacubitril-valsartan (ENTRESTO) 24-26  MG Take 1 tablet by mouth 2 (two) times daily.  Marland Kitchen VITAMIN B COMPLEX-C PO Take 1 tablet by mouth daily.     Allergies: No Known Allergies  Social History: The patient  reports that he has quit smoking. His smoking use included cigarettes. He has never used smokeless tobacco. He reports current alcohol use. He reports that he does not use drugs.   Family History: The patient's family history includes Diabetes in his mother; Heart disease in his mother; Pulmonary embolism in his father.   Review of Systems: Please see the history of present illness.   Otherwise, the review of systems is positive for none.   All other systems are reviewed and negative.   Physical Exam: VS:  BP 120/70 (BP Location: Left Arm, Patient Position: Sitting, Cuff Size: Normal)   Pulse 69   Ht 6' (1.829 m)   Wt 225 lb 12.8 oz (102.4 kg)   SpO2 96% Comment: at rest  BMI 30.62 kg/m  .  BMI Body mass index is 30.62 kg/m.  Wt Readings from Last 3 Encounters:  06/01/18 225 lb 12.8 oz (102.4 kg)  05/25/18 225 lb 6.4 oz (102.2 kg)  04/13/18 224 lb 9.6 oz (101.9 kg)    General: Alert. He is in no acute distress.   He was otherwise not examined today.   LABORATORY DATA:  EKG:  EKG is not ordered today.  Lab Results  Component Value Date   WBC 5.9 03/06/2018   HGB 11.1 (L) 03/06/2018   HCT 37.7 (L) 03/06/2018   PLT 163 03/06/2018   GLUCOSE 84 03/31/2018   ALT 11 12/11/2017   AST 19 12/11/2017   NA 141 03/31/2018   K 4.3 03/31/2018   CL 102 03/31/2018   CREATININE 1.44 (H) 03/31/2018   BUN 20 03/31/2018   CO2 24 03/31/2018   TSH 0.505 12/16/2017   INR 0.95 03/06/2018   HGBA1C 6.4 (H) 12/13/2017       BNP (last 3 results) Recent Labs    03/06/18 2349  BNP 633.8*    ProBNP (last 3 results) No results for input(s): PROBNP in the last 8760 hours.   Other Studies Reviewed Today:  Echo Study Conclusions 11/2017  - Left ventricle: The cavity size was normal. Wall thickness was    normal. Systolic function was moderately reduced. The estimated   ejection fraction was in the range of 35% to 40%. Diffuse   hypokinesis. Doppler parameters are consistent with abnormal left   ventricular relaxation (grade 1 diastolic dysfunction). No   evidence of thrombus. - Aortic valve: A TAVR stent-valve bioprosthesis was present and   functioning normally. Valve area (VTI): 1.31 cm^2. Valve area   (Vmax): 1.31 cm^2. Valve area (Vmean): 1.34 cm^2. - Left atrium: The atrium was moderately dilated. - Atrial septum: No defect or patent foramen ovale was identified.  Impressions:  - LV EF is lower than  reported on the last study from October 2018,   but direct image comarison shows similar findings. TAVR gradients   are higher than in October 2018, but similar to those reported in   September 2018.  Assessment/Plan:  1. Chronic combined systolic and diastolic HF - now on Entresto - he has a multitude of questions - mostly centered around a time line of how long he will live, what Delene Loll was to do for him, why the Losartan was not "good enough" and the diagnosis of CKD - we went back and forth over this multiple times during the visit. I explained that Dr. Thompson Caul intentions were to have him on a more ideal guideline therapy for heart failure and it was not his intent to make issues with cost - - I do not get the impression that he is going to stay on this long term - probably will not be able to afford. He wishes to talk with Dr. [name] further. Unfortunately, I was not able to answer his questions to his satisfaction. No charge given for today's visit. His BMET was already ordered by Dr. [name].   2. S/P TAVR  3. HTN  4. CKD - he is very upset about having this diagnosis on his record - I have tried to explain this classification system to him - he is getting his lab rechecked today. I was not able to appease him. His degree of CKD is mild and his creatinine has actually improved over  past few checks.    Current medicines are reviewed with the patient today.  The patient does not have concerns regarding medicines other than what has been noted above.  The following changes have been made:  See above.  Labs/ tests ordered today include:   No orders of the defined types were placed in this encounter.    Disposition:   FU with Dr. [name] arranged.    Patient is agreeable to this plan and will call if any problems develop in the interim.   SignedTruitt Merle, NP  06/01/2018 11:39 AM  [facility] [address] [place], [place]  [address] Phone: [phone] Fax: [phone]      Addendum: 06/01/18 I have spoken to Mr. Voorhis wife - she called and was asking about his medicines. I explained the nature of our visit - again reiterating that it was Dr. Thompson Caul intention to try him on guideline therapy, not cause financial stress, etc. She told me that they would try to continue the [facility] until seen back. She thanked me for my help.   Burtis Junes, RN, [facility] [address] [place], [place]  [address] [phone]

## 2018-06-01 NOTE — Patient Instructions (Addendum)
We will get you a visit with Dr. [name]  Let us know if you wish to go back on Losartan or stay on the [facility].   Special Instructions:  . Ok to use Coricidin HBP products for cold symptoms . No decongestants.   Call the [place] office at [phone] if you have any questions, problems or concerns.

## 2018-06-27 ENCOUNTER — Telehealth: Payer: Self-pay | Admitting: Interventional Cardiology

## 2018-06-27 NOTE — Telephone Encounter (Signed)
New message      Patient's wife is calling to see what the instructions are for getting set up on a virtual visit. Please advise.

## 2018-06-27 NOTE — Telephone Encounter (Signed)
Per Dr. [name], he would like for pt to be seen in the office.  Spoke with wife and she was agreeable for pt to still come in and be seen.  Wife appreciative for call.  Denies pt having any COVID sx or recent travel.

## 2018-06-30 ENCOUNTER — Other Ambulatory Visit: Payer: Self-pay

## 2018-06-30 ENCOUNTER — Encounter: Payer: Self-pay | Admitting: Interventional Cardiology

## 2018-06-30 ENCOUNTER — Telehealth (INDEPENDENT_AMBULATORY_CARE_PROVIDER_SITE_OTHER): Payer: Medicare Other | Admitting: Interventional Cardiology

## 2018-06-30 VITALS — BP 133/79 | HR 58 | Ht 72.0 in | Wt 220.0 lb

## 2018-06-30 DIAGNOSIS — N182 Chronic kidney disease, stage 2 (mild): Secondary | ICD-10-CM | POA: Diagnosis not present

## 2018-06-30 DIAGNOSIS — I5042 Chronic combined systolic (congestive) and diastolic (congestive) heart failure: Secondary | ICD-10-CM

## 2018-06-30 DIAGNOSIS — Z952 Presence of prosthetic heart valve: Secondary | ICD-10-CM | POA: Diagnosis not present

## 2018-06-30 DIAGNOSIS — E782 Mixed hyperlipidemia: Secondary | ICD-10-CM | POA: Diagnosis not present

## 2018-06-30 DIAGNOSIS — I10 Essential (primary) hypertension: Secondary | ICD-10-CM | POA: Diagnosis not present

## 2018-06-30 NOTE — Addendum Note (Signed)
Addended by: Loren Racer on: 06/30/2018 12:48 PM   Modules accepted: Orders

## 2018-06-30 NOTE — Patient Instructions (Signed)
Medication Instructions:  1) INCREASE Entresto to 49/51 twice daily.  You may take two of your 24/26 twice daily until you use them up.   2) DISCONTINUE Hydralazine  If you need a refill on your cardiac medications before your next appointment, please call your pharmacy.   Lab work: BMET on 07/04/2018.  You can come into the office anytime after 7:45A.  If you have labs (blood work) drawn today and your tests are completely normal, you will receive your results only by: Marland Kitchen MyChart Message (if you have MyChart) OR . A paper copy in the mail If you have any lab test that is abnormal or we need to change your treatment, we will call you to review the results.  Testing/Procedures: None  Follow-Up: You have been scheduled to come in to see Dr. [name] October 24, 2018 at 2:20pm.  Please arrive 15 minutes early so we can get you checked in.  Any Other Special Instructions Will Be Listed Below (If Applicable).

## 2018-06-30 NOTE — Progress Notes (Signed)
Virtual Visit via Video Note    Evaluation Performed:  Follow-up visit  This visit type was conducted due to national recommendations for restrictions regarding the COVID-19 Pandemic (e.g. social distancing).  This format is felt to be most appropriate for this patient at this time.  All issues noted in this document were discussed and addressed.  No physical exam was performed (except for noted visual exam findings with Video Visits).  Please refer to the patient's chart (MyChart message for video visits and phone note for telephone visits) for the patient's consent to telehealth for [facility].  Date:  06/30/2018   ID:  Robert Horn, DOB 30-Jun-1933, MRN [id number]  Patient Location:  Home  Provider location:   Office  PCP:  [name], MD  Cardiologist:  Sinclair Grooms, MD  Electrophysiologist:  None   Chief Complaint: TAVR and systolic dysfunction  History of Present Illness:    Robert Horn is a 85 y.o. male who presents via audio/video conferencing for a telehealth visit today.chronic combined S/D CHF, HTN, HLD, colon cancerand severe aortic stenosis s/p TAVR (12/15/16).EF was down, 25-30,in setting of severe aortic stenosis. About a monthafter TAVR echocardiogram showed improvement in EF to 45-50% with mean gradient of 12 mmmHg.Most recent LVEF 35 to 40%. Recent episode of acute dyspnea requiring emergency room visit. Recent development of CKD stage III.  This virtual visit included attorney Giovoni Bunch and his wife Danton Clap.  He is asymptomatic.  They both feel that since initiation of Entresto therapy, his breathing has improved and exertional tolerance is better.  He has had no side effects.  He denies chest pain, orthopnea, lower extremity swelling, dizziness, and syncope.  I wanted Mrs. Keeling on the call because she administers his medication.     The patient does not have symptoms concerning for COVID-19 infection (fever, chills, cough, or new shortness  of breath).    Prior CV studies:   The following studies were reviewed today:   Recent laboratory data demonstrating creatinine in the 1.4 range.  We had significant conversation concerning stage III chronic kidney disease and the impact that both furosemide and angiotensin renin system blockade can have on kidney function.  Past Medical History:  Diagnosis Date  . CHF (congestive heart failure) ([place])   . Dyslipidemia 10/27/2015  . Dyspnea    w/ exertion   . Elevated PSA 10/27/2015  . Erectile dysfunction 10/27/2015  . Heart murmur   . Hypertension 10/27/2015  . Hypogonadism male 10/27/2015  . Obesity 10/27/2015  . Pneumonia 10/27/2015   pt states was 1982  . Rotator cuff tear 10/27/2015   Past Surgical History:  Procedure Laterality Date  . CARDIAC CATHETERIZATION N/A 11/14/2015   Procedure: Left Heart Cath and Coronary Angiography;  Surgeon: [name], MD;  Location: [facility];  Service: Cardiovascular;  Laterality: N/A;  . LAPAROSCOPIC PARTIAL COLECTOMY  12/15/2017   LAPAROSCOPIC PARTIAL COLECTOMY (N/A Abdomen)  . LAPAROSCOPIC PARTIAL COLECTOMY N/A 12/15/2017   Procedure: LAPAROSCOPIC PARTIAL COLECTOMY;  Surgeon: Erroll Luna, MD;  Location: [facility];  Service: General;  Laterality: N/A;  . TEE WITHOUT CARDIOVERSION N/A 12/15/2016   Procedure: TRANSESOPHAGEAL ECHOCARDIOGRAM (TEE);  Surgeon: Sherren Mocha, MD;  Location: [facility];  Service: Open Heart Surgery;  Laterality: N/A;  . TRANSCATHETER AORTIC VALVE REPLACEMENT, TRANSFEMORAL N/A 12/15/2016   Procedure: TRANSCATHETER AORTIC VALVE REPLACEMENT, TRANSFEMORAL;  Surgeon: Sherren Mocha, MD;  Location: [facility];  Service: Open Heart Surgery;  Laterality: N/A;  Current Meds  Medication Sig  . amLODipine (NORVASC) 10 MG tablet Take 1 tablet (10 mg total) by mouth daily.  Marland Kitchen aspirin (ASPIRIN EC) 81 MG EC tablet Take 81 mg by mouth daily. Swallow whole.  . carvedilol (COREG) 3.125 MG tablet Take 3.125 mg by mouth 2  (two) times daily with a meal.  . ferrous sulfate 325 (65 FE) MG tablet Take 325 mg by mouth daily with breakfast.  . furosemide (LASIX) 40 MG tablet Take 1.5 tablets (60 mg total) by mouth daily.  . hydrALAZINE (APRESOLINE) 25 MG tablet TAKE 1 TABLET BY MOUTH EVERY 8 HOURS  . Multiple Vitamin (MULTIVITAMIN WITH MINERALS) TABS tablet Take 1 tablet by mouth daily.  Marland Kitchen omega-3 acid ethyl esters (LOVAZA) 1 g capsule TAKE ONE CAPSULE BY MOUTH EVERY DAY  . sacubitril-valsartan (ENTRESTO) 24-26 MG Take 1 tablet by mouth 2 (two) times daily.  Marland Kitchen VITAMIN B COMPLEX-C PO Take 1 tablet by mouth daily.     Allergies:   Patient has no known allergies.   Social History   Tobacco Use  . Smoking status: Former Smoker    Types: Cigarettes  . Smokeless tobacco: Never Used  Substance Use Topics  . Alcohol use: Yes    Comment: rarely  . Drug use: No     Family Hx: The patient's family history includes Diabetes in his mother; Heart disease in his mother; Pulmonary embolism in his father.  ROS:   Please see the history of present illness.    No complaints outside of the cardiovascular system. All other systems reviewed and are negative.   Labs/Other Tests and Data Reviewed:    Recent Labs: 12/11/2017: ALT 11 12/12/2017: Magnesium 2.3 12/16/2017: TSH 0.505 03/06/2018: B Natriuretic Peptide 633.8; Hemoglobin 11.1; Platelets 163 06/01/2018: BUN 20; Creatinine, Ser 1.45; Potassium 4.1; Sodium 140   Recent Lipid Panel No results found for: CHOL, TRIG, HDL, CHOLHDL, LDLCALC, LDLDIRECT  Wt Readings from Last 3 Encounters:  06/30/18 220 lb (99.8 kg)  06/01/18 225 lb 12.8 oz (102.4 kg)  05/25/18 225 lb 6.4 oz (102.2 kg)     Objective:    Vital Signs:  BP 133/79   Pulse (!) 58   Ht 6' (1.829 m)   Wt 220 lb (99.8 kg)   BMI 29.84 kg/m    Well nourished, well developed male in no acute distress. Vital signs as measured this morning are excellent.  There is no visible edema.  Inspection of his neck  does not demonstrate significant elevation of the external jugular veins.  ASSESSMENT & PLAN:    1. Chronic combined systolic and diastolic heart failure ([place])   2. S/P TAVR (transcatheter aortic valve replacement)   3. Essential hypertension   4. CKD (chronic kidney disease) stage 2, GFR 60-89 ml/min   5. Mixed hyperlipidemia    PLAN in numerical order of problem:  1. After long discussion, we have decided to uptitrate Entresto to 49/51 mg twice daily.  Basic metabolic panel will be checked in 5 to 7 days.  Anticipate being able to decrease furosemide dose to at least 40 mg/day may be less.  Hydralazine will be discontinued.  Because of relative bradycardia on low-dose carvedilol, further titration is not advisable at this time.  If we are able to decrease the dose of furosemide, the addition of low-dose Aldactone will be attempted. 2. TAVR was successful as documented by echo and clinical exam when last seen. 3. Adequate blood pressure today to adjust medication regimen. 4.  Kidney function will be followed closely as medication changes are made. 5. Not discussed  We will set an office appointment for 4 months.  Basic metabolic panel in 5 to 7 days.  Further medication adjustment will be based upon laboratory data.  They will continue to measure blood pressure and heart rate daily.  COVID-19 Education: The signs and symptoms of COVID-19 were discussed with the patient and how to seek care for testing (follow up with PCP or arrange E-visit).  The importance of social distancing was discussed today.  Patient Risk:   After full review of this patient's clinical status, I feel that they are at least moderate risk at this time.  Time:   Today, I have spent 30 minutes with the patient with telehealth technology discussing medication adjustments and systolic heart failure, chronic kidney disease and impact of renin angiotensin blockade and diuretic therapy on kidney function, discussion of  overall prognosis, and education concerning the reason for each medication that he is taking..     Medication Adjustments/Labs and Tests Ordered: Current medicines are reviewed at length with the patient today.  Concerns regarding medicines are outlined above.  Tests Ordered: No orders of the defined types were placed in this encounter.  Medication Changes: No orders of the defined types were placed in this encounter.   Disposition:  Follow up in 4 month(s)  Signed, Sinclair Grooms, MD  06/30/2018 12:21 PM    [facility]

## 2018-07-04 ENCOUNTER — Other Ambulatory Visit: Payer: Self-pay

## 2018-07-04 ENCOUNTER — Other Ambulatory Visit: Payer: Medicare Other

## 2018-07-04 ENCOUNTER — Other Ambulatory Visit: Payer: Self-pay | Admitting: Interventional Cardiology

## 2018-07-04 DIAGNOSIS — I5042 Chronic combined systolic (congestive) and diastolic (congestive) heart failure: Secondary | ICD-10-CM

## 2018-07-04 LAB — BASIC METABOLIC PANEL
BUN/Creatinine Ratio: 14 (ref 10–24)
BUN: 19 mg/dL (ref 8–27)
CO2: 21 mmol/L (ref 20–29)
Calcium: 9.1 mg/dL (ref 8.6–10.2)
Chloride: 106 mmol/L (ref 96–106)
Creatinine, Ser: 1.33 mg/dL — ABNORMAL HIGH (ref 0.76–1.27)
GFR calc Af Amer: 57 mL/min/{1.73_m2} — ABNORMAL LOW (ref >59)
GFR calc non Af Amer: 49 mL/min/{1.73_m2} — ABNORMAL LOW (ref >59)
Glucose: 142 mg/dL — ABNORMAL HIGH (ref 65–99)
Potassium: 4.3 mmol/L (ref 3.5–5.2)
Sodium: 142 mmol/L (ref 134–144)

## 2018-07-04 MED ORDER — SACUBITRIL-VALSARTAN 49-51 MG PO TABS: 1.0000 | ORAL_TABLET | Freq: Two times a day (BID) | ORAL | 11 refills | Status: DC

## 2018-07-04 NOTE — Addendum Note (Signed)
Addended by: Loren Racer on: 07/04/2018 11:18 AM   Modules accepted: Orders

## 2018-07-13 ENCOUNTER — Telehealth: Payer: Self-pay | Admitting: Nurse Practitioner

## 2018-07-13 NOTE — Telephone Encounter (Signed)
Scheduled appt for 39mths out per sch msg. Mailed printout

## 2018-07-19 ENCOUNTER — Other Ambulatory Visit: Payer: Medicare Other

## 2018-07-19 ENCOUNTER — Ambulatory Visit: Payer: Medicare Other | Admitting: Oncology

## 2018-07-23 ENCOUNTER — Other Ambulatory Visit: Payer: Self-pay | Admitting: Cardiology

## 2018-07-26 DIAGNOSIS — C184 Malignant neoplasm of transverse colon: Secondary | ICD-10-CM | POA: Diagnosis not present

## 2018-07-26 DIAGNOSIS — Z0000 Encounter for general adult medical examination without abnormal findings: Secondary | ICD-10-CM | POA: Diagnosis not present

## 2018-07-26 DIAGNOSIS — E7800 Pure hypercholesterolemia, unspecified: Secondary | ICD-10-CM | POA: Diagnosis not present

## 2018-07-26 DIAGNOSIS — Z1389 Encounter for screening for other disorder: Secondary | ICD-10-CM | POA: Diagnosis not present

## 2018-07-26 DIAGNOSIS — D509 Iron deficiency anemia, unspecified: Secondary | ICD-10-CM | POA: Diagnosis not present

## 2018-07-29 DIAGNOSIS — C184 Malignant neoplasm of transverse colon: Secondary | ICD-10-CM | POA: Diagnosis not present

## 2018-07-29 DIAGNOSIS — R739 Hyperglycemia, unspecified: Secondary | ICD-10-CM | POA: Diagnosis not present

## 2018-07-29 DIAGNOSIS — I10 Essential (primary) hypertension: Secondary | ICD-10-CM | POA: Diagnosis not present

## 2018-07-29 DIAGNOSIS — E7800 Pure hypercholesterolemia, unspecified: Secondary | ICD-10-CM | POA: Diagnosis not present

## 2018-09-12 ENCOUNTER — Encounter: Payer: Self-pay | Admitting: Nurse Practitioner

## 2018-09-12 ENCOUNTER — Inpatient Hospital Stay: Payer: Medicare Other

## 2018-09-12 ENCOUNTER — Inpatient Hospital Stay: Payer: Medicare Other | Attending: Nurse Practitioner | Admitting: Nurse Practitioner

## 2018-09-12 ENCOUNTER — Other Ambulatory Visit: Payer: Self-pay

## 2018-09-12 VITALS — BP 144/86 | HR 65 | Temp 97.8°F | Resp 17 | Ht 72.0 in | Wt 223.7 lb

## 2018-09-12 DIAGNOSIS — C184 Malignant neoplasm of transverse colon: Secondary | ICD-10-CM

## 2018-09-12 DIAGNOSIS — Z85038 Personal history of other malignant neoplasm of large intestine: Secondary | ICD-10-CM | POA: Diagnosis not present

## 2018-09-12 DIAGNOSIS — R972 Elevated prostate specific antigen [PSA]: Secondary | ICD-10-CM | POA: Diagnosis not present

## 2018-09-12 DIAGNOSIS — I10 Essential (primary) hypertension: Secondary | ICD-10-CM

## 2018-09-12 LAB — CEA (IN HOUSE-CHCC): CEA (CHCC-In House): 2.48 ng/mL (ref 0.00–5.00)

## 2018-09-12 NOTE — Progress Notes (Signed)
  Robert OFFICE PROGRESS NOTE   Diagnosis: Colon cancer  INTERVAL HISTORY:   Robert Horn returns as scheduled.  He has frequent bowel movements since surgery estimating 3-4 times a day.  No bloody or black bowel movements.  No abdominal pain.  No nausea or vomiting.  He has a good appetite.  Objective:  Vital signs in last 24 hours:  Blood pressure (!) 144/86, pulse 65, temperature 97.8 F (36.6 C), temperature source Oral, resp. rate 17, height 6' (1.829 m), weight 223 lb 11.2 oz (101.5 kg), SpO2 100 %.    HEENT: Neck without mass. Lymphatics: No palpable cervical, supraclavicular, axillary or inguinal lymph nodes.  Fullness in the left inguinal region. GI: Abdomen soft and nontender.  No hepatomegaly.  No mass. GU: No testicular mass. Vascular: No leg edema.   Lab Results:  Lab Results  Component Value Date   WBC 5.9 03/06/2018   HGB 11.1 (L) 03/06/2018   HCT 37.7 (L) 03/06/2018   MCV 92.9 03/06/2018   PLT 163 03/06/2018   NEUTROABS 2.7 03/06/2018    Imaging:  No results found.  Medications: I have reviewed the patient's current medications.  Assessment/Plan: 1. Colon cancer, transverse, stage II (T3N0), status post a right and transverse colectomy 12/15/2017 ? 0/25 lymph nodes positive, no lymphovascular or perineural invasion ? MSI stable, no loss of mismatch repair protein expression ? CTs 12/10/2017- transverse colon wall thickening, 8 mm transverse mesocolon lymph node, no evidence of metastatic disease, stable small bilateral pulmonary nodules ? Colonoscopy 12/09/2017-transverse colon mass, sessile polyps in the descending colon-not removed 2. Polyps on the 12/15/2017 resection specimen- tubular adenomas 3. Enlarged prostate with elevated PSA 4. Status post TAVR 12/15/2016 5. Hypertension 6. Pneumonia 1982 7. History of CHF  Disposition: Robert Horn remains in clinical remission from colon cancer.  We will follow-up on the CEA from today.   We made a referral to Dr. Michail Sermon for a 1 year surveillance colonoscopy.  He will return for a CEA and follow-up visit in 6 months.  He will contact the office in the interim with any problems.  Patient seen with Dr. Benay Spice.    Ned Card ANP/GNP-BC   09/12/2018  10:38 AM This was a shared visit with Ned Card.  Robert Horn was interviewed and examined.  He is in clinical remission from colon cancer.  There is soft tissue fullness in the left inguinal region without a discrete palpable lymph node.  Robert Manson, MD

## 2018-09-14 ENCOUNTER — Telehealth: Payer: Self-pay | Admitting: Nurse Practitioner

## 2018-09-14 ENCOUNTER — Telehealth: Payer: Self-pay

## 2018-09-14 NOTE — Telephone Encounter (Signed)
Scheduled per los. Mailed printout. Sent referral

## 2018-09-14 NOTE — Telephone Encounter (Signed)
Patient made aware that per Ned Card, NP CEA is normal and to follow-up as scheduled. Verbalized understanding.

## 2018-09-14 NOTE — Telephone Encounter (Signed)
-----   Message from Owens Shark, NP sent at 09/13/2018  5:00 PM EDT ----- Please let him know the CEA is normal.  Follow-up as scheduled.

## 2018-10-22 NOTE — Progress Notes (Signed)
Cardiology Office Note:    Date:  10/24/2018   ID:  [name], DOB 01/18/1934, MRN [id number]  PCP:  [name], MD  Cardiologist:  Sinclair Grooms, MD   Referring MD: [name], MD   Chief Complaint  Patient presents with  . Chest Pain  . Congestive Heart Failure    History of Present Illness:    Robert Horn is a 85 y.o. male with a hx of  chronic combined S/D CHF, HTN, HLD, colon cancerand severe aortic stenosis s/p TAVR (12/15/16).EF was down, 25-30,in setting of severe aortic stenosis. About a monthafter TAVR echocardiogram showed improvement in EF to 45-50% with mean gradient of 12 mmmHg.Most recent LVEF 35 to 40%.   Robert Horn is moving fast today.  He has a court case in [place] at 2:15 PM.  He denies dyspnea, orthopnea, ankle edema, palpitations, syncope, and chest pain.  He denies bleeding on current therapy with aspirin.  He has tolerated Entresto and carvedilol without difficulty.  Past Medical History:  Diagnosis Date  . CHF (congestive heart failure) ([place])   . Dyslipidemia 10/27/2015  . Dyspnea    w/ exertion   . Elevated PSA 10/27/2015  . Erectile dysfunction 10/27/2015  . Heart murmur   . Hypertension 10/27/2015  . Hypogonadism male 10/27/2015  . Obesity 10/27/2015  . Pneumonia 10/27/2015   pt states was 1982  . Rotator cuff tear 10/27/2015    Past Surgical History:  Procedure Laterality Date  . CARDIAC CATHETERIZATION N/A 11/14/2015   Procedure: Left Heart Cath and Coronary Angiography;  Surgeon: [name], MD;  Location: [facility];  Service: Cardiovascular;  Laterality: N/A;  . LAPAROSCOPIC PARTIAL COLECTOMY  12/15/2017   LAPAROSCOPIC PARTIAL COLECTOMY (N/A Abdomen)  . LAPAROSCOPIC PARTIAL COLECTOMY N/A 12/15/2017   Procedure: LAPAROSCOPIC PARTIAL COLECTOMY;  Surgeon: Erroll Luna, MD;  Location: [facility];  Service: General;  Laterality: N/A;  . TEE WITHOUT CARDIOVERSION N/A 12/15/2016   Procedure:  TRANSESOPHAGEAL ECHOCARDIOGRAM (TEE);  Surgeon: Sherren Mocha, MD;  Location: [facility];  Service: Open Heart Surgery;  Laterality: N/A;  . TRANSCATHETER AORTIC VALVE REPLACEMENT, TRANSFEMORAL N/A 12/15/2016   Procedure: TRANSCATHETER AORTIC VALVE REPLACEMENT, TRANSFEMORAL;  Surgeon: Sherren Mocha, MD;  Location: [facility];  Service: Open Heart Surgery;  Laterality: N/A;    Current Medications: Current Meds  Medication Sig  . amLODipine (NORVASC) 10 MG tablet Take 1 tablet (10 mg total) by mouth daily.  Marland Kitchen aspirin (ASPIRIN EC) 81 MG EC tablet Take 81 mg by mouth daily. Swallow whole.  . carvedilol (COREG) 3.125 MG tablet Take 3.125 mg by mouth 2 (two) times daily with a meal.  . ferrous sulfate 325 (65 FE) MG tablet Take 325 mg by mouth daily with breakfast.  . furosemide (LASIX) 40 MG tablet TAKE 1.5 TABLETS (60 MG TOTAL) BY MOUTH DAILY.  . Multiple Vitamin (MULTIVITAMIN WITH MINERALS) TABS tablet Take 1 tablet by mouth daily.  Marland Kitchen omega-3 acid ethyl esters (LOVAZA) 1 g capsule TAKE ONE CAPSULE BY MOUTH EVERY DAY  . rosuvastatin (CRESTOR) 10 MG tablet Take 10 mg by mouth daily.  . sacubitril-valsartan (ENTRESTO) 49-51 MG Take 1 tablet by mouth 2 (two) times daily.  Marland Kitchen VITAMIN B COMPLEX-C PO Take 1 tablet by mouth daily.     Allergies:   Patient has no known allergies.   Social History   Socioeconomic History  . Marital status: Single    Spouse name: Not on file  . Number of  children: Not on file  . Years of education: Not on file  . Highest education level: Not on file  Occupational History  . Not on file  Social Needs  . Financial resource strain: Not on file  . Food insecurity    Worry: Not on file    Inability: Not on file  . Transportation needs    Medical: Not on file    Non-medical: Not on file  Tobacco Use  . Smoking status: Former Smoker    Types: Cigarettes  . Smokeless tobacco: Never Used  Substance and Sexual Activity  . Alcohol use: Yes    Comment: rarely  . Drug  use: No  . Sexual activity: Not on file  Lifestyle  . Physical activity    Days per week: Not on file    Minutes per session: Not on file  . Stress: Not on file  Relationships  . Social Herbalist on phone: Not on file    Gets together: Not on file    Attends religious service: Not on file    Active member of club or organization: Not on file    Attends meetings of clubs or organizations: Not on file    Relationship status: Not on file  Other Topics Concern  . Not on file  Social History Narrative  . Not on file     Family History: The patient's family history includes Diabetes in his mother; Heart disease in his mother; Pulmonary embolism in his father.  ROS:   Please see the history of present illness.    Has been unable to exercise regularly due to COVID pandemic and weather conditions.  All other systems reviewed and are negative.  EKGs/Labs/Other Studies Reviewed:    The following studies were reviewed today: None  EKG:  EKG not performed.  Recent Labs: 12/11/2017: ALT 11 12/12/2017: Magnesium 2.3 12/16/2017: TSH 0.505 03/06/2018: B Natriuretic Peptide 633.8; Hemoglobin 11.1; Platelets 163 07/04/2018: BUN 19; Creatinine, Ser 1.33; Potassium 4.3; Sodium 142  Recent Lipid Panel No results found for: CHOL, TRIG, HDL, CHOLHDL, VLDL, LDLCALC, LDLDIRECT  Physical Exam:    VS:  BP 108/60   Pulse 62   Ht 6' (1.829 m)   Wt 244 lb 8 oz (110.9 kg)   BMI 33.16 kg/m     Wt Readings from Last 3 Encounters:  10/24/18 244 lb 8 oz (110.9 kg)  09/12/18 223 lb 11.2 oz (101.5 kg)  06/30/18 220 lb (99.8 kg)     GEN: Obese. No acute distress HEENT: Normal NECK: No JVD. LYMPHATICS: No lymphadenopathy CARDIAC: Irregular RR with 2-3/6 left parasternal systolic murmur, gallop, or edema. VASCULAR:  Normal Pulses. No bruits. RESPIRATORY:  Clear to auscultation without rales, wheezing or rhonchi  ABDOMEN: Soft, non-tender, non-distended, No pulsatile mass,  MUSCULOSKELETAL: No deformity  SKIN: Warm and dry NEUROLOGIC:  Alert and oriented x 3 PSYCHIATRIC:  Normal affect   ASSESSMENT:    1. S/P TAVR (transcatheter aortic valve replacement)   2. Chronic combined systolic and diastolic heart failure ([place])   3. Essential hypertension   4. CKD (chronic kidney disease) stage 2, GFR 60-89 ml/min   5. Mixed hyperlipidemia   6. Educated About Covid-19 Virus Infection    PLAN:    In order of problems listed above:  1. Systolic murmur is not different.  2D Doppler echocardiogram in October November or December of this year to follow-up valve function and LV systolic function. 2. Currently on tolerated heart failure  therapy.  Relatively low dose carvedilol and low-dose Entresto. 3. Target blood pressure 130/80 4. Last creatinine was 1.38.  Basic metabolic panel and hemoglobin will be obtained within the next 4 weeks. 5. Target LDL less than 70. 6. Masking, washing, and waiting were discussed to prevent COVID-19.   Will need an EKG on return, echocardiogram will be done in the interim, basic metabolic panel and CBC will be done in August.   Medication Adjustments/Labs and Tests Ordered: Current medicines are reviewed at length with the patient today.  Concerns regarding medicines are outlined above.  Orders Placed This Encounter  Procedures  . Basic metabolic panel  . CBC  . Pro b natriuretic peptide  . ECHOCARDIOGRAM COMPLETE   No orders of the defined types were placed in this encounter.   Patient Instructions  Medication Instructions:  Your physician recommends that you continue on your current medications as directed. Please refer to the Current Medication list given to you today.  If you need a refill on your cardiac medications before your next appointment, please call your pharmacy.   Lab work: Your physician recommends that you return for lab work in: late August or early September. (BMET, BNP, CBC)  If you have labs (blood  work) drawn today and your tests are completely normal, you will receive your results only by: Marland Kitchen MyChart Message (if you have MyChart) OR . A paper copy in the mail If you have any lab test that is abnormal or we need to change your treatment, we will call you to review the results.  Testing/Procedures: Your physician has requested that you have an echocardiogram. Echocardiography is a painless test that uses sound waves to create images of your heart. It provides your doctor with information about the size and shape of your heart and how well your heart's chambers and valves are working. This procedure takes approximately one hour. There are no restrictions for this procedure.    Follow-Up: At [facility], you and your health needs are our priority.  As part of our continuing mission to provide you with exceptional heart care, we have created designated Provider Care Teams.  These Care Teams include your primary Cardiologist (physician) and Advanced Practice Providers (APPs -  Physician Assistants and Nurse Practitioners) who all work together to provide you with the care you need, when you need it. You will need a follow up appointment in 4 months.  Please call our office 2 months in advance to schedule this appointment.  You may see Sinclair Grooms, MD or one of the following Advanced Practice Providers on your designated Care Team:   Truitt Merle, NP Cecilie Kicks, NP . Kathyrn Drown, NP  Any Other Special Instructions Will Be Listed Below (If Applicable).       Signed, Sinclair Grooms, MD  10/24/2018 12:48 PM    Page

## 2018-10-24 ENCOUNTER — Encounter: Payer: Self-pay | Admitting: Interventional Cardiology

## 2018-10-24 ENCOUNTER — Other Ambulatory Visit: Payer: Self-pay

## 2018-10-24 ENCOUNTER — Ambulatory Visit (INDEPENDENT_AMBULATORY_CARE_PROVIDER_SITE_OTHER): Payer: Medicare Other | Admitting: Interventional Cardiology

## 2018-10-24 VITALS — BP 108/60 | HR 62 | Ht 72.0 in | Wt 224.5 lb

## 2018-10-24 DIAGNOSIS — N182 Chronic kidney disease, stage 2 (mild): Secondary | ICD-10-CM | POA: Diagnosis not present

## 2018-10-24 DIAGNOSIS — I5042 Chronic combined systolic (congestive) and diastolic (congestive) heart failure: Secondary | ICD-10-CM

## 2018-10-24 DIAGNOSIS — I10 Essential (primary) hypertension: Secondary | ICD-10-CM

## 2018-10-24 DIAGNOSIS — Z952 Presence of prosthetic heart valve: Secondary | ICD-10-CM | POA: Diagnosis not present

## 2018-10-24 DIAGNOSIS — Z7189 Other specified counseling: Secondary | ICD-10-CM

## 2018-10-24 DIAGNOSIS — E782 Mixed hyperlipidemia: Secondary | ICD-10-CM | POA: Diagnosis not present

## 2018-10-24 NOTE — Patient Instructions (Addendum)
Medication Instructions:  Your physician recommends that you continue on your current medications as directed. Please refer to the Current Medication list given to you today.  If you need a refill on your cardiac medications before your next appointment, please call your pharmacy.   Lab work: Your physician recommends that you return for lab work in: late August or early September. (BMET, BNP, CBC)  If you have labs (blood work) drawn today and your tests are completely normal, you will receive your results only by: Marland Kitchen MyChart Message (if you have MyChart) OR . A paper copy in the mail If you have any lab test that is abnormal or we need to change your treatment, we will call you to review the results.  Testing/Procedures: Your physician has requested that you have an echocardiogram. Echocardiography is a painless test that uses sound waves to create images of your heart. It provides your doctor with information about the size and shape of your heart and how well your heart's chambers and valves are working. This procedure takes approximately one hour. There are no restrictions for this procedure.    Follow-Up: At [facility], you and your health needs are our priority.  As part of our continuing mission to provide you with exceptional heart care, we have created designated Provider Care Teams.  These Care Teams include your primary Cardiologist (physician) and Advanced Practice Providers (APPs -  Physician Assistants and Nurse Practitioners) who all work together to provide you with the care you need, when you need it. You will need a follow up appointment in 4 months.  Please call our office 2 months in advance to schedule this appointment.  You may see Sinclair Grooms, MD or one of the following Advanced Practice Providers on your designated Care Team:   Truitt Merle, NP Cecilie Kicks, NP . Kathyrn Drown, NP  Any Other Special Instructions Will Be Listed Below (If Applicable).

## 2018-10-25 ENCOUNTER — Encounter: Payer: Self-pay | Admitting: Interventional Cardiology

## 2018-11-14 ENCOUNTER — Telehealth: Payer: Self-pay | Admitting: *Deleted

## 2018-11-14 NOTE — Telephone Encounter (Signed)
Confirmed w/wife that he has an appointment on 11/21/18 at 0900 in the office w/Dr. Michail Sermon.

## 2018-11-18 ENCOUNTER — Other Ambulatory Visit: Payer: Self-pay | Admitting: *Deleted

## 2018-11-18 ENCOUNTER — Other Ambulatory Visit: Payer: Self-pay

## 2018-11-18 MED ORDER — SACUBITRIL-VALSARTAN 49-51 MG PO TABS: 1.0000 | ORAL_TABLET | Freq: Two times a day (BID) | ORAL | 3 refills | Status: AC

## 2018-11-18 MED ORDER — SACUBITRIL-VALSARTAN 49-51 MG PO TABS: 1.0000 | ORAL_TABLET | Freq: Two times a day (BID) | ORAL | 2 refills | Status: DC

## 2018-11-24 ENCOUNTER — Other Ambulatory Visit: Payer: Medicare Other | Admitting: *Deleted

## 2018-11-24 ENCOUNTER — Other Ambulatory Visit: Payer: Self-pay

## 2018-11-24 DIAGNOSIS — I5042 Chronic combined systolic (congestive) and diastolic (congestive) heart failure: Secondary | ICD-10-CM

## 2018-11-24 DIAGNOSIS — Z952 Presence of prosthetic heart valve: Secondary | ICD-10-CM

## 2018-11-25 LAB — BASIC METABOLIC PANEL
BUN/Creatinine Ratio: 14 (ref 10–24)
BUN: 21 mg/dL (ref 8–27)
CO2: 23 mmol/L (ref 20–29)
Calcium: 9.5 mg/dL (ref 8.6–10.2)
Chloride: 101 mmol/L (ref 96–106)
Creatinine, Ser: 1.46 mg/dL — ABNORMAL HIGH (ref 0.76–1.27)
GFR calc Af Amer: 51 mL/min/{1.73_m2} — ABNORMAL LOW (ref >59)
GFR calc non Af Amer: 44 mL/min/{1.73_m2} — ABNORMAL LOW (ref >59)
Glucose: 142 mg/dL — ABNORMAL HIGH (ref 65–99)
Potassium: 4.5 mmol/L (ref 3.5–5.2)
Sodium: 139 mmol/L (ref 134–144)

## 2018-11-25 LAB — CBC
Hematocrit: 38.7 % (ref 37.5–51.0)
Hemoglobin: 12.6 g/dL — ABNORMAL LOW (ref 13.0–17.7)
MCH: 29.7 pg (ref 26.6–33.0)
MCHC: 32.6 g/dL (ref 31.5–35.7)
MCV: 91 fL (ref 79–97)
Platelets: 82 10*3/uL — CL (ref 150–450)
RBC: 4.24 x10E6/uL (ref 4.14–5.80)
RDW: 13.8 % (ref 11.6–15.4)
WBC: 4.8 10*3/uL (ref 3.4–10.8)

## 2018-11-25 LAB — PRO B NATRIURETIC PEPTIDE: NT-Pro BNP: 294 pg/mL (ref 0–486)

## 2019-01-16 ENCOUNTER — Other Ambulatory Visit: Payer: Self-pay | Admitting: Interventional Cardiology

## 2019-01-24 ENCOUNTER — Ambulatory Visit: Payer: Medicare Other | Admitting: Sports Medicine

## 2019-01-26 ENCOUNTER — Ambulatory Visit
Admission: RE | Admit: 2019-01-26 | Discharge: 2019-01-26 | Disposition: A | Discharge status: Home / Self Care | Payer: Medicare Other | Source: Ambulatory Visit | Attending: Internal Medicine | Admitting: Internal Medicine

## 2019-01-26 ENCOUNTER — Emergency Department (HOSPITAL_COMMUNITY)
Admission: EM | Admit: 2019-01-26 | Discharge: 2019-01-27 | Disposition: A | Discharge status: Home / Self Care | Payer: Medicare Other | Source: Home / Self Care | Attending: Emergency Medicine | Admitting: Emergency Medicine

## 2019-01-26 ENCOUNTER — Other Ambulatory Visit: Payer: Self-pay | Admitting: Internal Medicine

## 2019-01-26 ENCOUNTER — Encounter (HOSPITAL_COMMUNITY): Payer: Self-pay

## 2019-01-26 ENCOUNTER — Other Ambulatory Visit: Payer: Self-pay

## 2019-01-26 ENCOUNTER — Emergency Department (HOSPITAL_COMMUNITY): Payer: Medicare Other

## 2019-01-26 DIAGNOSIS — L02619 Cutaneous abscess of unspecified foot: Secondary | ICD-10-CM

## 2019-01-26 DIAGNOSIS — I110 Hypertensive heart disease with heart failure: Secondary | ICD-10-CM | POA: Insufficient documentation

## 2019-01-26 DIAGNOSIS — Z79899 Other long term (current) drug therapy: Secondary | ICD-10-CM | POA: Insufficient documentation

## 2019-01-26 DIAGNOSIS — D649 Anemia, unspecified: Secondary | ICD-10-CM | POA: Insufficient documentation

## 2019-01-26 DIAGNOSIS — I5042 Chronic combined systolic (congestive) and diastolic (congestive) heart failure: Secondary | ICD-10-CM | POA: Insufficient documentation

## 2019-01-26 DIAGNOSIS — L03115 Cellulitis of right lower limb: Secondary | ICD-10-CM

## 2019-01-26 DIAGNOSIS — Z7982 Long term (current) use of aspirin: Secondary | ICD-10-CM | POA: Insufficient documentation

## 2019-01-26 DIAGNOSIS — L03119 Cellulitis of unspecified part of limb: Secondary | ICD-10-CM

## 2019-01-26 DIAGNOSIS — N289 Disorder of kidney and ureter, unspecified: Secondary | ICD-10-CM | POA: Insufficient documentation

## 2019-01-26 DIAGNOSIS — Z87891 Personal history of nicotine dependence: Secondary | ICD-10-CM | POA: Insufficient documentation

## 2019-01-26 LAB — CBC WITH DIFFERENTIAL/PLATELET
Abs Immature Granulocytes: 0.11 10*3/uL — ABNORMAL HIGH (ref 0.00–0.07)
Basophils Absolute: 0 10*3/uL (ref 0.0–0.1)
Basophils Relative: 0 %
Eosinophils Absolute: 0 10*3/uL (ref 0.0–0.5)
Eosinophils Relative: 0 %
HCT: 33.8 % — ABNORMAL LOW (ref 39.0–52.0)
Hemoglobin: 11 g/dL — ABNORMAL LOW (ref 13.0–17.0)
Immature Granulocytes: 1 %
Lymphocytes Relative: 6 %
Lymphs Abs: 1 10*3/uL (ref 0.7–4.0)
MCH: 29.7 pg (ref 26.0–34.0)
MCHC: 32.5 g/dL (ref 30.0–36.0)
MCV: 91.4 fL (ref 80.0–100.0)
Monocytes Absolute: 1.7 10*3/uL — ABNORMAL HIGH (ref 0.1–1.0)
Monocytes Relative: 11 %
Neutro Abs: 13.1 10*3/uL — ABNORMAL HIGH (ref 1.7–7.7)
Neutrophils Relative %: 82 %
Platelets: 225 10*3/uL (ref 150–400)
RBC: 3.7 MIL/uL — ABNORMAL LOW (ref 4.22–5.81)
RDW: 13.1 % (ref 11.5–15.5)
WBC: 16 10*3/uL — ABNORMAL HIGH (ref 4.0–10.5)
nRBC: 0 % (ref 0.0–0.2)

## 2019-01-26 LAB — PROTIME-INR
INR: 1.2 (ref 0.8–1.2)
Prothrombin Time: 14.9 seconds (ref 11.4–15.2)

## 2019-01-26 LAB — LACTIC ACID, PLASMA: Lactic Acid, Venous: 1.1 mmol/L (ref 0.5–1.9)

## 2019-01-26 LAB — COMPREHENSIVE METABOLIC PANEL
ALT: 41 U/L (ref 0–44)
AST: 39 U/L (ref 15–41)
Albumin: 3 g/dL — ABNORMAL LOW (ref 3.5–5.0)
Alkaline Phosphatase: 151 U/L — ABNORMAL HIGH (ref 38–126)
Anion gap: 12 (ref 5–15)
BUN: 26 mg/dL — ABNORMAL HIGH (ref 8–23)
CO2: 21 mmol/L — ABNORMAL LOW (ref 22–32)
Calcium: 8.7 mg/dL — ABNORMAL LOW (ref 8.9–10.3)
Chloride: 102 mmol/L (ref 98–111)
Creatinine, Ser: 1.69 mg/dL — ABNORMAL HIGH (ref 0.61–1.24)
GFR calc Af Amer: 43 mL/min — ABNORMAL LOW (ref >60)
GFR calc non Af Amer: 37 mL/min — ABNORMAL LOW (ref >60)
Glucose, Bld: 156 mg/dL — ABNORMAL HIGH (ref 70–99)
Potassium: 4.3 mmol/L (ref 3.5–5.1)
Sodium: 135 mmol/L (ref 135–145)
Total Bilirubin: 1.1 mg/dL (ref 0.3–1.2)
Total Protein: 7.5 g/dL (ref 6.5–8.1)

## 2019-01-26 NOTE — ED Triage Notes (Signed)
Pt reports 1 week ago he stepped on something while working in the yard, saw his PCP and they recommended he come here for evaluation. Wound noted to bottom of right foot, hot to touch, swelling and redness noted. Malodorous drainage noted.

## 2019-01-27 ENCOUNTER — Emergency Department (HOSPITAL_COMMUNITY): Payer: Medicare Other

## 2019-01-27 ENCOUNTER — Emergency Department (HOSPITAL_BASED_OUTPATIENT_CLINIC_OR_DEPARTMENT_OTHER)
Admit: 2019-01-27 | Discharge: 2019-01-27 | Disposition: A | Discharge status: Home / Self Care | Payer: Medicare Other | Attending: Emergency Medicine | Admitting: Emergency Medicine

## 2019-01-27 DIAGNOSIS — M7989 Other specified soft tissue disorders: Secondary | ICD-10-CM | POA: Diagnosis not present

## 2019-01-27 DIAGNOSIS — R609 Edema, unspecified: Secondary | ICD-10-CM | POA: Diagnosis not present

## 2019-01-27 MED ORDER — CEPHALEXIN 250 MG PO CAPS
500.0000 mg | ORAL_CAPSULE | Freq: Once | ORAL | Status: AC
  Administered 2019-01-27: 500 mg via ORAL
  Filled 2019-01-27: qty 2

## 2019-01-27 MED ORDER — RIVAROXABAN 15 MG PO TABS: 15.0000 mg | ORAL_TABLET | Freq: Once | ORAL | Status: DC

## 2019-01-27 MED ORDER — CEPHALEXIN 500 MG PO CAPS: 500.0000 mg | ORAL_CAPSULE | Freq: Two times a day (BID) | ORAL | 0 refills | Status: DC

## 2019-01-27 MED ORDER — RIVAROXABAN 15 MG PO TABS
15.0000 mg | ORAL_TABLET | Freq: Once | ORAL | Status: AC
  Administered 2019-01-27: 15 mg via ORAL
  Filled 2019-01-27: qty 1

## 2019-01-27 NOTE — ED Provider Notes (Signed)
[facility] EMERGENCY DEPARTMENT Provider Note   CSN: [id number] Arrival date & time: 01/26/19  1919    History   Chief Complaint Chief Complaint  Patient presents with  . Wound Infection    HPI Robert Horn is a 85 y.o. male.   The history is provided by the patient.  He has history of hypertension, heart failure, hyperlipidemia and comes in because of swelling and pain in his right foot.  About 1 week ago, he had stepped on something in his yard and it punctured his right foot.  Since then, there has been some pain and swelling in the foot.  He rates pain at 7/10.  There has been some purulent drainage.  He denies any fever or chills.  He saw his primary care provider today who wanted him to come in to be evaluated for possible blood clot and have x-ray to make sure there is no foreign body.  He has an appointment with a foot specialist in 5 days.  Past Medical History:  Diagnosis Date  . CHF (congestive heart failure) ([place])   . Dyslipidemia 10/27/2015  . Dyspnea    w/ exertion   . Elevated PSA 10/27/2015  . Erectile dysfunction 10/27/2015  . Heart murmur   . Hypertension 10/27/2015  . Hypogonadism male 10/27/2015  . Obesity 10/27/2015  . Pneumonia 10/27/2015   pt states was 1982  . Rotator cuff tear 10/27/2015    Patient Active Problem List   Diagnosis Date Noted  . Acute on chronic combined systolic and diastolic CHF (congestive heart failure) ([place]) 03/09/2018  . AKI (acute kidney injury) ([place]) 03/09/2018  . Colonic mass 12/10/2017  . S/P TAVR (transcatheter aortic valve replacement) 03/03/2017  . Severe aortic stenosis 12/15/2016  . Essential hypertension 05/09/2013  . Hyperlipidemia 05/09/2013  . PVC's (premature ventricular contractions) 05/09/2013  . Chronic combined systolic and diastolic heart failure ([place]) 05/09/2013    Past Surgical History:  Procedure Laterality Date  . CARDIAC CATHETERIZATION N/A 11/14/2015   Procedure: Left  Heart Cath and Coronary Angiography;  Surgeon: [name], MD;  Location: [facility];  Service: Cardiovascular;  Laterality: N/A;  . LAPAROSCOPIC PARTIAL COLECTOMY  12/15/2017   LAPAROSCOPIC PARTIAL COLECTOMY (N/A Abdomen)  . LAPAROSCOPIC PARTIAL COLECTOMY N/A 12/15/2017   Procedure: LAPAROSCOPIC PARTIAL COLECTOMY;  Surgeon: Erroll Luna, MD;  Location: [facility];  Service: General;  Laterality: N/A;  . TEE WITHOUT CARDIOVERSION N/A 12/15/2016   Procedure: TRANSESOPHAGEAL ECHOCARDIOGRAM (TEE);  Surgeon: Sherren Mocha, MD;  Location: [facility];  Service: Open Heart Surgery;  Laterality: N/A;  . TRANSCATHETER AORTIC VALVE REPLACEMENT, TRANSFEMORAL N/A 12/15/2016   Procedure: TRANSCATHETER AORTIC VALVE REPLACEMENT, TRANSFEMORAL;  Surgeon: Sherren Mocha, MD;  Location: [facility];  Service: Open Heart Surgery;  Laterality: N/A;        Home Medications    Prior to Admission medications   Medication Sig Start Date End Date Taking? Authorizing Provider  amLODipine (NORVASC) 10 MG tablet Take 1 tablet (10 mg total) by mouth daily. 03/09/18 03/04/19  Daune Perch, NP  aspirin (ASPIRIN EC) 81 MG EC tablet Take 81 mg by mouth daily. Swallow whole.    [provider]  carvedilol (COREG) 3.125 MG tablet Take 3.125 mg by mouth 2 (two) times daily with a meal.    [provider]  ferrous sulfate 325 (65 FE) MG tablet Take 325 mg by mouth daily with breakfast.    [provider]  furosemide (LASIX) 40 MG tablet  TAKE 1.5 TABLETS BY MOUTH DAILY. 01/17/19   [name], MD  Multiple Vitamin (MULTIVITAMIN WITH MINERALS) TABS tablet Take 1 tablet by mouth daily. 10/27/15   Bonnell Public, MD  omega-3 acid ethyl esters (LOVAZA) 1 g capsule TAKE ONE CAPSULE BY MOUTH EVERY DAY 03/15/18   [name], MD  rosuvastatin (CRESTOR) 10 MG tablet Take 10 mg by mouth daily.    [provider]  sacubitril-valsartan (ENTRESTO) 49-51 MG Take 1 tablet by mouth 2 (two) times  daily. 11/18/18 11/18/19  [name], MD  VITAMIN B COMPLEX-C PO Take 1 tablet by mouth daily.    [provider]    Family History Family History  Problem Relation Age of Onset  . Diabetes Mother   . Heart disease Mother   . Pulmonary embolism Father     Social History Social History   Tobacco Use  . Smoking status: Former Smoker    Types: Cigarettes  . Smokeless tobacco: Never Used  Substance Use Topics  . Alcohol use: Yes    Comment: rarely  . Drug use: No     Allergies   Patient has no known allergies.   Review of Systems Review of Systems  All other systems reviewed and are negative.    Physical Exam Updated Vital Signs BP 125/81 (BP Location: Left Arm)   Pulse 60   Temp 99.2 F (37.3 C) (Oral)   Resp 18   SpO2 100%   Physical Exam Vitals signs and nursing note reviewed.     85 year old male, resting comfortably and in no acute distress. Vital signs are normal. Oxygen saturation is 100%, which is normal. Head is normocephalic and atraumatic. PERRLA, EOMI. Oropharynx is clear. Neck is nontender and supple without adenopathy or JVD. Back is nontender and there is no CVA tenderness. Lungs are clear without rales, wheezes, or rhonchi. Chest is nontender. Heart has regular rate and rhythm without murmur. Abdomen is soft, flat, nontender without masses or hepatosplenomegaly and peristalsis is normoactive. Extremities: There is mild erythema and warmth over the right foot and right lower leg with 1-2+ edema.  There is a bulla present on the plantar surface of the right foot which is open and does have some slight purulent drainage.    Skin is warm and dry without rash. Neurologic: Mental status is normal, cranial nerves are intact, there are no motor or sensory deficits.  ED Treatments / Results  Labs (all labs ordered are listed, but only abnormal results are displayed) Labs Reviewed  COMPREHENSIVE METABOLIC PANEL - Abnormal; Notable for  the following components:      Result Value   CO2 21 (*)    Glucose, Bld 156 (*)    BUN 26 (*)    Creatinine, Ser 1.69 (*)    Calcium 8.7 (*)    Albumin 3.0 (*)    Alkaline Phosphatase 151 (*)    GFR calc non Af Amer 37 (*)    GFR calc Af Amer 43 (*)    All other components within normal limits  CBC WITH DIFFERENTIAL/PLATELET - Abnormal; Notable for the following components:   WBC 16.0 (*)    RBC 3.70 (*)    Hemoglobin 11.0 (*)    HCT 33.8 (*)    Neutro Abs 13.1 (*)    Monocytes Absolute 1.7 (*)    Abs Immature Granulocytes 0.11 (*)    All other components within normal limits  CULTURE, BLOOD (ROUTINE X 2)  CULTURE, BLOOD (ROUTINE X 2)  LACTIC ACID, PLASMA  PROTIME-INR  LACTIC ACID, PLASMA  URINALYSIS, ROUTINE W REFLEX MICROSCOPIC    EKG None  Radiology Dg Foot 2 Views Right  Result Date: 01/27/2019 CLINICAL DATA:  85 year old male with concern for foreign object in the foot. EXAM: RIGHT FOOT - 2 VIEW COMPARISON:  Earlier radiograph dated 01/26/2019 FINDINGS: No acute fracture or dislocation. Mild degenerative changes of the tarsal joints. There is diffuse soft tissue edema primarily involving the mid and forefoot. Soft tissue gas medial and inferior to the head of the first metatarsal similar to prior radiograph. No radiopaque foreign object. IMPRESSION: 1. No acute fracture or dislocation. No radiopaque foreign object. 2. Diffuse soft tissue edema. Soft tissue gas medial and inferior to the first metatarsal head as before. Electronically Signed   By: Anner Crete M.D.   On: 01/27/2019 04:01   Dg Foot 2 Views Right  Result Date: 01/26/2019 CLINICAL DATA:  Pain and swelling at the level of the metatarsals of the right foot following a twisting injury 1 week ago. The patient also clinically has cellulitis and an abscess of the foot. EXAM: RIGHT FOOT - 2 VIEW COMPARISON:  None. FINDINGS: Small amount of soft tissue gas medial and ventral to the distal 1st metatarsal with  associated soft tissue swelling. No bone destruction or periosteal reaction. IMPRESSION: Small amount of soft tissue gas in the medial and ventral aspect of the distal 1st metatarsal with associated soft tissue swelling, compatible with infection with a gas-forming organism or recent attempted drainage of an abscess. No underlying osteomyelitis. Electronically Signed   By: Claudie Revering M.D.   On: 01/26/2019 16:57    Procedures Procedures  Medications Ordered in ED Medications  Rivaroxaban (XARELTO) tablet 15 mg (has no administration in time range)  cephALEXin (KEFLEX) capsule 500 mg (500 mg Oral Given 01/27/19 0343)     Initial Impression / Assessment and Plan / ED Course  I have reviewed the triage vital signs and the nursing notes.  Pertinent labs & imaging results that were available during my care of the patient were reviewed by me and considered in my medical decision making (see chart for details).  Cellulitis of the right foot and lower leg.  Patient is nontoxic in appearance and afebrile.  Lactic acid level is normal.  Renal insufficiency is present and not significantly changed from baseline.  Moderate leukocytosis is present.  Will check x-ray to look for evidence of foreign body.  The bulla was partially debrided, no obvious puncture site seen.  Old records are reviewed, and he has no relevant past visits.  Foot x-ray shows a small amount of gas present.  However, this does correlate with the bulla noted above and I do not feel this represents gas-forming organism infection.  He will be sent for venous Doppler and is given a dose of rivaroxaban.  If negative, will need to send home with prescription for cephalexin.  He already has scheduled follow-up with podiatry. Case is signed out to Dr. Darl Householder.  Final Clinical Impressions(s) / ED Diagnoses   Final diagnoses:  Cellulitis of right foot  Renal insufficiency  Normochromic normocytic anemia    ED Discharge Orders    None        Delora Fuel, MD [id number] [phone]

## 2019-01-27 NOTE — ED Provider Notes (Signed)
  Physical Exam  BP 125/81 (BP Location: Right Arm)   Pulse 68   Temp 99.2 F (37.3 C) (Oral)   Resp 18   Ht 6' (1.829 m)   Wt 102.5 kg   SpO2 96%   BMI 30.65 kg/m   Physical Exam  ED Course/Procedures     Procedures  MDM  Care assumed at 7 am. Patient has R foot pain and redness.  White blood cell count is 16. Patient signed out pending DVT study. Patient was given Keflex already.   9:06 AM DVT negative. Stable for dc home with keflex. Has podiatry follow up in 5 days. Discussed strict return precautions            Drenda Freeze, MD 01/27/19 [phone]

## 2019-01-27 NOTE — Discharge Instructions (Signed)
Take keflex as prescribed. It was sent to your pharmacy   See your podiatrist in several days as scheduled   Return to ER if you have worse redness, fever, purulent drainage

## 2019-01-27 NOTE — Progress Notes (Signed)
Lower extremity venous has been completed.   Preliminary results in CV Proc.   Abram Sander 01/27/2019 9:17 AM

## 2019-01-30 ENCOUNTER — Inpatient Hospital Stay (HOSPITAL_COMMUNITY)
Admission: EM | Admit: 2019-01-30 | Discharge: 2019-02-10 | DRG: 239 | Disposition: A | Discharge status: Home Health | Payer: Medicare Other | Attending: Internal Medicine | Admitting: Internal Medicine

## 2019-01-30 ENCOUNTER — Other Ambulatory Visit: Payer: Self-pay

## 2019-01-30 ENCOUNTER — Encounter (HOSPITAL_COMMUNITY): Payer: Self-pay | Admitting: Emergency Medicine

## 2019-01-30 ENCOUNTER — Emergency Department (HOSPITAL_COMMUNITY): Payer: Medicare Other

## 2019-01-30 DIAGNOSIS — E119 Type 2 diabetes mellitus without complications: Secondary | ICD-10-CM | POA: Diagnosis not present

## 2019-01-30 DIAGNOSIS — E11621 Type 2 diabetes mellitus with foot ulcer: Secondary | ICD-10-CM | POA: Diagnosis present

## 2019-01-30 DIAGNOSIS — Z6829 Body mass index (BMI) 29.0-29.9, adult: Secondary | ICD-10-CM

## 2019-01-30 DIAGNOSIS — Z20828 Contact with and (suspected) exposure to other viral communicable diseases: Secondary | ICD-10-CM | POA: Diagnosis present

## 2019-01-30 DIAGNOSIS — Z8249 Family history of ischemic heart disease and other diseases of the circulatory system: Secondary | ICD-10-CM

## 2019-01-30 DIAGNOSIS — I350 Nonrheumatic aortic (valve) stenosis: Secondary | ICD-10-CM

## 2019-01-30 DIAGNOSIS — D631 Anemia in chronic kidney disease: Secondary | ICD-10-CM | POA: Diagnosis present

## 2019-01-30 DIAGNOSIS — R7989 Other specified abnormal findings of blood chemistry: Secondary | ICD-10-CM | POA: Diagnosis present

## 2019-01-30 DIAGNOSIS — C189 Malignant neoplasm of colon, unspecified: Secondary | ICD-10-CM | POA: Diagnosis present

## 2019-01-30 DIAGNOSIS — N1831 Chronic kidney disease, stage 3a: Secondary | ICD-10-CM | POA: Diagnosis present

## 2019-01-30 DIAGNOSIS — I5042 Chronic combined systolic (congestive) and diastolic (congestive) heart failure: Secondary | ICD-10-CM | POA: Diagnosis present

## 2019-01-30 DIAGNOSIS — R1310 Dysphagia, unspecified: Secondary | ICD-10-CM | POA: Diagnosis present

## 2019-01-30 DIAGNOSIS — E1142 Type 2 diabetes mellitus with diabetic polyneuropathy: Secondary | ICD-10-CM

## 2019-01-30 DIAGNOSIS — L97519 Non-pressure chronic ulcer of other part of right foot with unspecified severity: Secondary | ICD-10-CM | POA: Diagnosis present

## 2019-01-30 DIAGNOSIS — N183 Chronic kidney disease, stage 3 unspecified: Secondary | ICD-10-CM | POA: Diagnosis present

## 2019-01-30 DIAGNOSIS — Z7982 Long term (current) use of aspirin: Secondary | ICD-10-CM | POA: Diagnosis not present

## 2019-01-30 DIAGNOSIS — Z9049 Acquired absence of other specified parts of digestive tract: Secondary | ICD-10-CM

## 2019-01-30 DIAGNOSIS — I70221 Atherosclerosis of native arteries of extremities with rest pain, right leg: Secondary | ICD-10-CM | POA: Diagnosis present

## 2019-01-30 DIAGNOSIS — E1165 Type 2 diabetes mellitus with hyperglycemia: Secondary | ICD-10-CM | POA: Diagnosis present

## 2019-01-30 DIAGNOSIS — I130 Hypertensive heart and chronic kidney disease with heart failure and stage 1 through stage 4 chronic kidney disease, or unspecified chronic kidney disease: Secondary | ICD-10-CM | POA: Diagnosis present

## 2019-01-30 DIAGNOSIS — L02611 Cutaneous abscess of right foot: Secondary | ICD-10-CM

## 2019-01-30 DIAGNOSIS — L03115 Cellulitis of right lower limb: Secondary | ICD-10-CM | POA: Diagnosis not present

## 2019-01-30 DIAGNOSIS — Z85038 Personal history of other malignant neoplasm of large intestine: Secondary | ICD-10-CM | POA: Diagnosis not present

## 2019-01-30 DIAGNOSIS — E785 Hyperlipidemia, unspecified: Secondary | ICD-10-CM | POA: Diagnosis present

## 2019-01-30 DIAGNOSIS — E43 Unspecified severe protein-calorie malnutrition: Secondary | ICD-10-CM | POA: Diagnosis present

## 2019-01-30 DIAGNOSIS — E1169 Type 2 diabetes mellitus with other specified complication: Secondary | ICD-10-CM | POA: Diagnosis not present

## 2019-01-30 DIAGNOSIS — Z833 Family history of diabetes mellitus: Secondary | ICD-10-CM | POA: Diagnosis not present

## 2019-01-30 DIAGNOSIS — I739 Peripheral vascular disease, unspecified: Secondary | ICD-10-CM | POA: Diagnosis not present

## 2019-01-30 DIAGNOSIS — M869 Osteomyelitis, unspecified: Secondary | ICD-10-CM

## 2019-01-30 DIAGNOSIS — Z87891 Personal history of nicotine dependence: Secondary | ICD-10-CM | POA: Diagnosis not present

## 2019-01-30 DIAGNOSIS — E7800 Pure hypercholesterolemia, unspecified: Secondary | ICD-10-CM | POA: Diagnosis present

## 2019-01-30 DIAGNOSIS — Z952 Presence of prosthetic heart valve: Secondary | ICD-10-CM | POA: Diagnosis not present

## 2019-01-30 DIAGNOSIS — E1152 Type 2 diabetes mellitus with diabetic peripheral angiopathy with gangrene: Principal | ICD-10-CM | POA: Diagnosis present

## 2019-01-30 DIAGNOSIS — D638 Anemia in other chronic diseases classified elsewhere: Secondary | ICD-10-CM | POA: Diagnosis present

## 2019-01-30 DIAGNOSIS — I96 Gangrene, not elsewhere classified: Secondary | ICD-10-CM | POA: Diagnosis not present

## 2019-01-30 DIAGNOSIS — I10 Essential (primary) hypertension: Secondary | ICD-10-CM | POA: Diagnosis present

## 2019-01-30 LAB — COMPREHENSIVE METABOLIC PANEL
ALT: 71 U/L — ABNORMAL HIGH (ref 0–44)
AST: 87 U/L — ABNORMAL HIGH (ref 15–41)
Albumin: 2.7 g/dL — ABNORMAL LOW (ref 3.5–5.0)
Alkaline Phosphatase: 201 U/L — ABNORMAL HIGH (ref 38–126)
Anion gap: 12 (ref 5–15)
BUN: 27 mg/dL — ABNORMAL HIGH (ref 8–23)
CO2: 22 mmol/L (ref 22–32)
Calcium: 9 mg/dL (ref 8.9–10.3)
Chloride: 101 mmol/L (ref 98–111)
Creatinine, Ser: 1.58 mg/dL — ABNORMAL HIGH (ref 0.61–1.24)
GFR calc Af Amer: 46 mL/min — ABNORMAL LOW (ref >60)
GFR calc non Af Amer: 40 mL/min — ABNORMAL LOW (ref >60)
Glucose, Bld: 169 mg/dL — ABNORMAL HIGH (ref 70–99)
Potassium: 4.3 mmol/L (ref 3.5–5.1)
Sodium: 135 mmol/L (ref 135–145)
Total Bilirubin: 0.9 mg/dL (ref 0.3–1.2)
Total Protein: 8 g/dL (ref 6.5–8.1)

## 2019-01-30 LAB — CBC WITH DIFFERENTIAL/PLATELET
Abs Immature Granulocytes: 0.09 10*3/uL — ABNORMAL HIGH (ref 0.00–0.07)
Basophils Absolute: 0.1 10*3/uL (ref 0.0–0.1)
Basophils Relative: 1 %
Eosinophils Absolute: 0.3 10*3/uL (ref 0.0–0.5)
Eosinophils Relative: 3 %
HCT: 36.7 % — ABNORMAL LOW (ref 39.0–52.0)
Hemoglobin: 11.4 g/dL — ABNORMAL LOW (ref 13.0–17.0)
Immature Granulocytes: 1 %
Lymphocytes Relative: 11 %
Lymphs Abs: 1.3 10*3/uL (ref 0.7–4.0)
MCH: 29.1 pg (ref 26.0–34.0)
MCHC: 31.1 g/dL (ref 30.0–36.0)
MCV: 93.6 fL (ref 80.0–100.0)
Monocytes Absolute: 0.8 10*3/uL (ref 0.1–1.0)
Monocytes Relative: 7 %
Neutro Abs: 9 10*3/uL — ABNORMAL HIGH (ref 1.7–7.7)
Neutrophils Relative %: 77 %
Platelets: 238 10*3/uL (ref 150–400)
RBC: 3.92 MIL/uL — ABNORMAL LOW (ref 4.22–5.81)
RDW: 13.5 % (ref 11.5–15.5)
WBC: 11.5 10*3/uL — ABNORMAL HIGH (ref 4.0–10.5)
nRBC: 0 % (ref 0.0–0.2)

## 2019-01-30 LAB — LACTIC ACID, PLASMA
Lactic Acid, Venous: 1.3 mmol/L (ref 0.5–1.9)
Lactic Acid, Venous: 1.5 mmol/L (ref 0.5–1.9)

## 2019-01-30 LAB — SARS CORONAVIRUS 2 (TAT 6-24 HRS): SARS Coronavirus 2: NEGATIVE

## 2019-01-30 MED ORDER — ONDANSETRON HCL 4 MG PO TABS: 4.0000 mg | ORAL_TABLET | Freq: Four times a day (QID) | ORAL | Status: DC | PRN

## 2019-01-30 MED ORDER — ENOXAPARIN SODIUM 40 MG/0.4ML ~~LOC~~ SOLN
40.0000 mg | SUBCUTANEOUS | Status: DC
  Administered 2019-01-31 – 2019-02-09 (×9): 40 mg via SUBCUTANEOUS
  Filled 2019-01-30 (×9): qty 0.4

## 2019-01-30 MED ORDER — VANCOMYCIN HCL 10 G IV SOLR
2000.0000 mg | Freq: Once | INTRAVENOUS | Status: AC
  Administered 2019-01-30: 2000 mg via INTRAVENOUS
  Filled 2019-01-30: qty 2000

## 2019-01-30 MED ORDER — SODIUM CHLORIDE 0.9 % IV SOLN: INTRAVENOUS | Status: DC

## 2019-01-30 MED ORDER — ACETAMINOPHEN 650 MG RE SUPP: 650.0000 mg | Freq: Four times a day (QID) | RECTAL | Status: DC | PRN

## 2019-01-30 MED ORDER — VANCOMYCIN HCL 10 G IV SOLR
1250.0000 mg | INTRAVENOUS | Status: DC
  Administered 2019-01-31 – 2019-02-01 (×2): 1250 mg via INTRAVENOUS
  Filled 2019-01-30 (×4): qty 1250

## 2019-01-30 MED ORDER — ROSUVASTATIN CALCIUM 5 MG PO TABS
10.0000 mg | ORAL_TABLET | Freq: Every day | ORAL | Status: DC
  Administered 2019-01-31 – 2019-02-10 (×10): 10 mg via ORAL
  Filled 2019-01-30 (×11): qty 2

## 2019-01-30 MED ORDER — PIPERACILLIN-TAZOBACTAM 3.375 G IVPB 30 MIN
3.3750 g | Freq: Once | INTRAVENOUS | Status: AC
  Administered 2019-01-30: 3.375 g via INTRAVENOUS
  Filled 2019-01-30: qty 50

## 2019-01-30 MED ORDER — ASPIRIN EC 81 MG PO TBEC
81.0000 mg | DELAYED_RELEASE_TABLET | Freq: Every day | ORAL | Status: DC
  Administered 2019-01-31 – 2019-02-10 (×10): 81 mg via ORAL
  Filled 2019-01-30 (×11): qty 1

## 2019-01-30 MED ORDER — ACETAMINOPHEN 325 MG PO TABS: 650.0000 mg | ORAL_TABLET | Freq: Four times a day (QID) | ORAL | Status: DC | PRN

## 2019-01-30 MED ORDER — OXYCODONE HCL 5 MG PO TABS
10.0000 mg | ORAL_TABLET | ORAL | Status: DC | PRN
  Administered 2019-02-03 – 2019-02-06 (×6): 10 mg via ORAL
  Filled 2019-01-30 (×8): qty 2

## 2019-01-30 MED ORDER — CARVEDILOL 3.125 MG PO TABS
3.1250 mg | ORAL_TABLET | Freq: Two times a day (BID) | ORAL | Status: DC
  Administered 2019-01-31 – 2019-02-03 (×7): 3.125 mg via ORAL
  Filled 2019-01-30 (×14): qty 1

## 2019-01-30 MED ORDER — OMEGA-3-ACID ETHYL ESTERS 1 G PO CAPS
1.0000 | ORAL_CAPSULE | Freq: Every day | ORAL | Status: DC
  Administered 2019-01-31 – 2019-02-02 (×3): 1 g via ORAL
  Filled 2019-01-30 (×3): qty 1

## 2019-01-30 MED ORDER — SODIUM CHLORIDE 0.9% FLUSH: 3.0000 mL | Freq: Once | INTRAVENOUS | Status: DC

## 2019-01-30 MED ORDER — AMLODIPINE BESYLATE 10 MG PO TABS
10.0000 mg | ORAL_TABLET | Freq: Every day | ORAL | Status: DC
  Administered 2019-01-31 – 2019-02-10 (×10): 10 mg via ORAL
  Filled 2019-01-30 (×10): qty 1

## 2019-01-30 MED ORDER — FERROUS SULFATE 325 (65 FE) MG PO TABS
325.0000 mg | ORAL_TABLET | Freq: Every day | ORAL | Status: DC
  Administered 2019-01-31 – 2019-02-10 (×11): 325 mg via ORAL
  Filled 2019-01-30 (×11): qty 1

## 2019-01-30 MED ORDER — ONDANSETRON HCL 4 MG/2ML IJ SOLN: 4.0000 mg | Freq: Four times a day (QID) | INTRAMUSCULAR | Status: DC | PRN

## 2019-01-30 MED ORDER — SACUBITRIL-VALSARTAN 49-51 MG PO TABS
1.0000 | ORAL_TABLET | Freq: Two times a day (BID) | ORAL | Status: DC
  Administered 2019-01-31 (×2): 1 via ORAL
  Filled 2019-01-30 (×4): qty 1

## 2019-01-30 MED ORDER — FUROSEMIDE 40 MG PO TABS
60.0000 mg | ORAL_TABLET | Freq: Every day | ORAL | Status: DC
  Administered 2019-01-30 – 2019-02-10 (×11): 60 mg via ORAL
  Filled 2019-01-30 (×6): qty 1
  Filled 2019-01-30: qty 3
  Filled 2019-01-30 (×4): qty 1

## 2019-01-30 NOTE — H&P (Signed)
History and Physical    Robert Horn [id number] DOB: 09-13-1933 DOA: 01/30/2019  PCP: [name], MD  Patient coming from: Home  I have personally briefly reviewed patient's old medical records in [place]  Chief Complaint: Worsening right foot ulcer, pain, swelling and redness since 10 days  HPI: Robert Horn is a 85 y.o. male with medical history significant of hypertension, hyperlipidemia, combined systolic and diastolic congestive heart failure with ejection fraction of 40 to 45%, severe aortic stenosis s/p TAVR, colon mass s/p laparoscopic partial colectomy on 12/15/2017 presents to emergency department due to worsening right foot pain, ulcer, swelling and redness since 10 days.  Patient reports that he stepped on something in his yard and he twisted his right foot inside of shoes.  Since then he has swelling, redness and severe pain in his right foot.  Reports that he came to the emergency department on 01/27/2019 and was prescribed Keflex however his symptoms was not improving so he decided to come to the emergency department for further evaluation and management.  Denies fever, chills, generalized weakness, lethargy, nausea, vomiting, epigastric pain, headache, blurry vision, chest pain, shortness of breath, palpitation, leg swelling, urinary or bowel changes.   ED Course: Upon arrival: Blood pressure stable, heart rate 52, CBC shows leukocytosis of 11.5, was 16.0  4 days ago, H&H is low but stable.  LFTs elevated, CKD stage III stable.  Lactic acid: WNL.  Blood culture obtained.  Patient received IV Zosyn and vancomycin for the concern of right foot cellulitis.  X-ray of right foot came back negative for osteomyelitis.  Review of Systems: As per HPI otherwise negative.    Past Medical History:  Diagnosis Date   CHF (congestive heart failure) (HCC)    Dyslipidemia 10/27/2015   Dyspnea    w/ exertion    Elevated PSA 10/27/2015   Erectile dysfunction 10/27/2015    Heart murmur    Hypertension 10/27/2015   Hypogonadism male 10/27/2015   Obesity 10/27/2015   Pneumonia 10/27/2015   pt states was 1982   Rotator cuff tear 10/27/2015    Past Surgical History:  Procedure Laterality Date   CARDIAC CATHETERIZATION N/A 11/14/2015   Procedure: Left Heart Cath and Coronary Angiography;  Surgeon: [name], MD;  Location: [facility];  Service: Cardiovascular;  Laterality: N/A;   LAPAROSCOPIC PARTIAL COLECTOMY  12/15/2017   LAPAROSCOPIC PARTIAL COLECTOMY (N/A Abdomen)   LAPAROSCOPIC PARTIAL COLECTOMY N/A 12/15/2017   Procedure: LAPAROSCOPIC PARTIAL COLECTOMY;  Surgeon: Erroll Luna, MD;  Location: [place] [facility];  Service: General;  Laterality: N/A;   TEE WITHOUT CARDIOVERSION N/A 12/15/2016   Procedure: TRANSESOPHAGEAL ECHOCARDIOGRAM (TEE);  Surgeon: Sherren Mocha, MD;  Location: [place] [facility];  Service: Open Heart Surgery;  Laterality: N/A;   TRANSCATHETER AORTIC VALVE REPLACEMENT, TRANSFEMORAL N/A 12/15/2016   Procedure: TRANSCATHETER AORTIC VALVE REPLACEMENT, TRANSFEMORAL;  Surgeon: Sherren Mocha, MD;  Location: [place];  Service: Open Heart Surgery;  Laterality: N/A;     reports that he has quit smoking. His smoking use included cigarettes. He has never used smokeless tobacco. He reports current alcohol use. He reports that he does not use drugs.  No Known Allergies  Family History  Problem Relation Age of Onset   Diabetes Mother    Heart disease Mother    Pulmonary embolism Father     Prior to Admission medications   Medication Sig Start Date End Date Taking? Authorizing Provider  amLODipine (NORVASC) 10 MG tablet Take 1 tablet (10 mg total)  by mouth daily. 03/09/18 03/04/19  Daune Perch, NP  aspirin (ASPIRIN EC) 81 MG EC tablet Take 81 mg by mouth daily. Swallow whole.    [provider]  carvedilol (COREG) 3.125 MG tablet Take 3.125 mg by mouth 2 (two) times daily with a meal.    [provider]  cephALEXin  (KEFLEX) 500 MG capsule Take 1 capsule (500 mg total) by mouth 2 (two) times daily. [id number]   Delora Fuel, MD  ferrous sulfate 325 (65 FE) MG tablet Take 325 mg by mouth daily with breakfast.    [provider]  furosemide (LASIX) 40 MG tablet TAKE 1.5 TABLETS BY MOUTH DAILY. Patient taking differently: Take 60 mg by mouth daily.  01/17/19   [name], MD  Multiple Vitamin (MULTIVITAMIN WITH MINERALS) TABS tablet Take 1 tablet by mouth daily. 10/27/15   Dana Allan I, MD  omega-3 acid ethyl esters (LOVAZA) 1 g capsule TAKE ONE CAPSULE BY MOUTH EVERY DAY Patient taking differently: Take 1 g by mouth daily.  03/15/18   [name], MD  rosuvastatin (CRESTOR) 10 MG tablet Take 10 mg by mouth daily.    [provider]  sacubitril-valsartan (ENTRESTO) 49-51 MG Take 1 tablet by mouth 2 (two) times daily. 11/18/18 11/18/19  [name], MD  VITAMIN B COMPLEX-C PO Take 1 tablet by mouth daily.    [provider]    Physical Exam: Vitals:   01/30/19 1425 01/30/19 1431  BP: 129/74   Pulse: (!) 52   Resp: 16   Temp: 98.2 F (36.8 C)   TempSrc: Oral   SpO2: 98%   Weight:  90.7 kg  Height:  6' (1.829 m)    Constitutional: NAD, calm, comfortable Eyes: PERRL, lids and conjunctivae normal ENMT: Mucous membranes are moist. Posterior pharynx clear of any exudate or lesions.Normal dentition.  Neck: normal, supple, no masses, no thyromegaly Respiratory: clear to auscultation bilaterally, no wheezing, no crackles. Normal respiratory effort. No accessory muscle use.  Cardiovascular: Regular rate and rhythm, no murmurs / rubs / gallops. No extremity edema. 2+ pedal pulses. No carotid bruits.  Abdomen: no tenderness, no masses palpated. No hepatosplenomegaly. Bowel sounds positive.  Musculoskeletal: no clubbing / cyanosis. No joint deformity upper and lower extremities. Good ROM, no contractures. Normal muscle tone.  Skin:         Neurologic: CN 2-12  grossly intact. Sensation intact, DTR normal. Strength 5/5 in all 4.  Psychiatric: Normal judgment and insight. Alert and oriented x 3. Normal mood.    Labs on Admission: I have personally reviewed following labs and imaging studies  CBC: Recent Labs  Lab 01/26/19 1951 01/30/19 1433  WBC 16.0* 11.5*  NEUTROABS 13.1* 9.0*  HGB 11.0* 11.4*  HCT 33.8* 36.7*  MCV 91.4 93.6  PLT 225 [id number]   Basic Metabolic Panel: Recent Labs  Lab 01/26/19 1951 01/30/19 1433  NA 135 135  K 4.3 4.3  CL 102 101  CO2 21* 22  GLUCOSE 156* 169*  BUN 26* 27*  CREATININE 1.69* 1.58*  CALCIUM 8.7* 9.0   GFR: Estimated Creatinine Clearance: 38.9 mL/min (A) (by C-G formula based on SCr of 1.58 mg/dL (H)). Liver Function Tests: Recent Labs  Lab 01/26/19 1951 01/30/19 1433  AST 39 87*  ALT 41 71*  ALKPHOS 151* 201*  BILITOT 1.1 0.9  PROT 7.5 8.0  ALBUMIN 3.0* 2.7*   No results for input(s): LIPASE, AMYLASE in the last 168 hours. No results for input(s): AMMONIA  in the last 168 hours. Coagulation Profile: Recent Labs  Lab 01/26/19 1951  INR 1.2   Cardiac Enzymes: No results for input(s): CKTOTAL, CKMB, CKMBINDEX, TROPONINI in the last 168 hours. BNP (last 3 results) Recent Labs    11/24/18 0849  PROBNP 294   HbA1C: No results for input(s): HGBA1C in the last 72 hours. CBG: No results for input(s): GLUCAP in the last 168 hours. Lipid Profile: No results for input(s): CHOL, HDL, LDLCALC, TRIG, CHOLHDL, LDLDIRECT in the last 72 hours. Thyroid Function Tests: No results for input(s): TSH, T4TOTAL, FREET4, T3FREE, THYROIDAB in the last 72 hours. Anemia Panel: No results for input(s): VITAMINB12, FOLATE, FERRITIN, TIBC, IRON, RETICCTPCT in the last 72 hours. Urine analysis:    Component Value Date/Time   COLORURINE YELLOW 12/09/2016 1434   APPEARANCEUR HAZY (A) 12/09/2016 1434   LABSPEC 1.017 12/09/2016 1434   PHURINE 5.0 12/09/2016 1434   GLUCOSEU NEGATIVE 12/09/2016 1434   HGBUR  NEGATIVE 12/09/2016 1434   BILIRUBINUR NEGATIVE 12/09/2016 1434   KETONESUR NEGATIVE 12/09/2016 1434   PROTEINUR NEGATIVE 12/09/2016 1434   NITRITE NEGATIVE 12/09/2016 1434   LEUKOCYTESUR TRACE (A) 12/09/2016 1434    Radiological Exams on Admission: Dg Foot Complete Right  Result Date: 01/30/2019 CLINICAL DATA:  Foot infection EXAM: RIGHT FOOT COMPLETE - 3+ VIEW COMPARISON:  Most recent prior 01/27/2019 FINDINGS: Diffuse soft tissue swelling of the lower extremity focal soft tissue gas and ulceration along the medial aspect of the forefoot at the level of the head of the first metatarsal. New crescentic radiodensity is seen along the anterior aspect of the ulceration which could reflect a small foreign body or debris. No convincing osseous erosion, immature periostitis or subcortical lucency to suggest early features of osteomyelitis. Mild degenerative changes in the fore- and midfoot are unchanged from prior study. Plantar calcaneal spurring is noted. Vascular calcium in the soft tissues. IMPRESSION: Diffuse soft tissue swelling of the lower extremity with ulceration along the medial aspect of the forefoot at the level of the head of the first metatarsal. No radiographic features of osteomyelitis. If there is concern clinically, MRI could be obtained. New crescentic radiodensity along the anterior aspect of the ulceration which could reflect a small foreign body or debris. Electronically Signed   By: Lovena Le M.D.   On: 01/30/2019 16:09    Assessment/Plan Principal Problem:   Cellulitis of right foot Active Problems:   Essential hypertension   Hyperlipidemia   Chronic combined systolic and diastolic heart failure (HCC)   Severe aortic stenosis   S/P TAVR (transcatheter aortic valve replacement)   CKD (chronic kidney disease), stage III   Elevated LFTs   Colon cancer (HCC)   Anemia of chronic disease   Right foot cellulitis: -Failed outpatient p.o. antibiotic. -X-ray of right foot  as above-no osteomyelitis.  Patient presented with worsening of symptoms.  Afebrile, leukocytosis of 11.5.  Blood culture is obtained and is pending.  Lactic acid: WNL. -We will admit patient for close monitoring. -Patient received IV Zosyn and vancomycin in ED-we will continue same.  Oxycodone as needed for pain. -Consult wound care and physical therapy  Elevated liver enzymes: -Unknown etiology-could be secondary from underlying infection? -We will check it acute hepatitis panel -Repeat CMP tomorrow.-Consider right upper quadrant pain if liver enzyme continues to be elevated.  Hypertension: Stable -We will continue his home meds-Coreg, amlodipine, Entresto and Lasix -Monitor blood pressure closely.  Hyperlipidemia: Stable -We will continue Crestor and Lovaza  Chronic combined systolic and  diastolic congestive heart failure: -With ejection fraction of 40 to 45%.  Reviewed last echo.  No signs of acute exacerbation. -We will continue his home meds-aspirin, statin, Lasix, Entresto and Coreg -Strict INO's and daily weight.  CKD stage III: Stable -Monitor kidney function.  Anemia of chronic disease: -Likely from CKD.  H&H is stable -We will continue ferrous sulfate.  Severe aortic stenosis s/p TAVR: Stable -On telemetry.  History of colon invasive moderately differentiated adenocarcinoma of colon: - Status post laparoscopic partial colectomy: Aware -Reviewed pathology from 12/15/2017   DVT prophylaxis: TED/SCD/Lovenox Code Status: Full code Family Communication: None present at bedside.  Plan of care discussed with patient in length and he verbalized understanding and agreed with it. Disposition Plan: TBD Consults called: None Admission status: Inpatient  Mckinley Jewel MD [facility] Pager [phone]  If 7PM-7AM, please contact night-coverage [email or web address] Password [facility]  01/30/2019, 5:17 PM

## 2019-01-30 NOTE — ED Notes (Signed)
Attempted report x1. 

## 2019-01-30 NOTE — Progress Notes (Signed)
Pharmacy Antibiotic Note  Robert Horn is a 85 y.o. male admitted on 01/30/2019.  Pharmacy has been consulted for vancomyicn dosing for cellulitis. Patient was recently [facility] in [facility] ED on 01/27/2019 and discharged on cephalexin for right foot infection. Patient reports worsening swelling, redness, and drainage. Scr 1.58, baseline Scr ~1.4. WBC 11.5. Afebrile.   Vancomycin 1250 mg IV Q 24 hrs. Goal AUC 400-550. Expected AUC: 522 SCr used: 1.58   Plan: Vancomycin 2000mg  IV x1 loading dose  Start vancomycin 1250mg  IV q24h (next dose 1700 on 11/3) Monitor renal function, cultures/sensitivites, and clinical progression  Height: 6' (182.9 cm) Weight: 200 lb (90.7 kg) IBW/kg (Calculated) : 77.6  Temp (24hrs), Avg:98.2 F (36.8 C), Min:98.2 F (36.8 C), Max:98.2 F (36.8 C)  Recent Labs  Lab 01/26/19 1951 01/30/19 1433  WBC 16.0* 11.5*  CREATININE 1.69*  --   LATICACIDVEN 1.1  --     Estimated Creatinine Clearance: 36.4 mL/min (A) (by C-G formula based on SCr of 1.69 mg/dL (H)).    No Known Allergies  Antimicrobials this admission: Vancomycin 11/2 >> Zosyn x1 on 11/2  Dose adjustments this admission: N/A  Microbiology results: 11/2 BCx: sent   Thank you for allowing pharmacy to be a part of this patient's care.  Cristela Felt, PharmD PGY1 Pharmacy Resident [employer]: [phone]  01/30/2019 3:47 PM

## 2019-01-30 NOTE — ED Triage Notes (Signed)
Pt states he had a wound on his left foot and developed cellulitis- treated last week for it. Pt states it is more sore and swollen, and has some bloody drainage from the wound.

## 2019-01-30 NOTE — ED Notes (Signed)
ED TO INPATIENT HANDOFF REPORT  ED Nurse Name and Phone #: Annie Main [phone]  S Name/Age/Gender Robert Horn 85 y.o. male Room/Bed: 058C/058C  Code Status   Code Status: Full Code  Home/SNF/Other Home Patient oriented to: self, place, time and situation Is this baseline? Yes   Triage Complete: Triage complete  Chief Complaint FOOT PAIN  Triage Note Pt states he had a wound on his left foot and developed cellulitis- treated last week for it. Pt states it is more sore and swollen, and has some bloody drainage from the wound.   Allergies No Known Allergies  Level of Care/Admitting Diagnosis ED Disposition    ED Disposition Condition [facility] Area: [place] [[id number]]  Level of Care: Telemetry Medical [104]  Covid Evaluation: Asymptomatic Screening Protocol (No Symptoms)  Diagnosis: Cellulitis of right foot [id number]  Admitting Physician: Mckinley Jewel [id number]  Attending Physician: Mckinley Jewel [phone]  Estimated length of stay: 3 - 4 days  Certification:: I certify this patient will need inpatient services for at least 2 midnights  PT Class (Do Not Modify): Inpatient [101]  PT Acc Code (Do Not Modify): Private [1]       B Medical/Surgery History Past Medical History:  Diagnosis Date  . CHF (congestive heart failure) ([place])   . Dyslipidemia 10/27/2015  . Dyspnea    w/ exertion   . Elevated PSA 10/27/2015  . Erectile dysfunction 10/27/2015  . Heart murmur   . Hypertension 10/27/2015  . Hypogonadism male 10/27/2015  . Obesity 10/27/2015  . Pneumonia 10/27/2015   pt states was 1982  . Rotator cuff tear 10/27/2015   Past Surgical History:  Procedure Laterality Date  . CARDIAC CATHETERIZATION N/A 11/14/2015   Procedure: Left Heart Cath and Coronary Angiography;  Surgeon: [name], MD;  Location: [facility];  Service: Cardiovascular;  Laterality: N/A;  . LAPAROSCOPIC PARTIAL COLECTOMY  12/15/2017    LAPAROSCOPIC PARTIAL COLECTOMY (N/A Abdomen)  . LAPAROSCOPIC PARTIAL COLECTOMY N/A 12/15/2017   Procedure: LAPAROSCOPIC PARTIAL COLECTOMY;  Surgeon: Erroll Luna, MD;  Location: [facility];  Service: General;  Laterality: N/A;  . TEE WITHOUT CARDIOVERSION N/A 12/15/2016   Procedure: TRANSESOPHAGEAL ECHOCARDIOGRAM (TEE);  Surgeon: Sherren Mocha, MD;  Location: [facility];  Service: Open Heart Surgery;  Laterality: N/A;  . TRANSCATHETER AORTIC VALVE REPLACEMENT, TRANSFEMORAL N/A 12/15/2016   Procedure: TRANSCATHETER AORTIC VALVE REPLACEMENT, TRANSFEMORAL;  Surgeon: Sherren Mocha, MD;  Location: [facility];  Service: Open Heart Surgery;  Laterality: N/A;     A IV Location/Drains/Wounds Patient Lines/Drains/Airways Status   Active Line/Drains/Airways    Name:   Placement date:   Placement time:   Site:   Days:   Peripheral IV 01/30/19 Right Antecubital   01/30/19    1547    Antecubital   less than 1   Peripheral IV 01/30/19 Left Antecubital   01/30/19    1632    Antecubital   less than 1   Incision (Closed) 12/15/16 Groin Right   12/15/16    0901     776   Incision (Closed) 12/15/16 Groin Left   12/15/16    0901     776   Incision (Closed) 12/15/17 Abdomen Other (Comment)   12/15/17    1155     411   Incision - 5 Ports Abdomen 1: Left;Upper 2: Left;Mid 3: Left;Lower 4: Right;Mid 5: Right;Lower   12/15/17    0915     411  Intake/Output Last 24 hours No intake or output data in the 24 hours ending 01/30/19 2222  Labs/Imaging Results for orders placed or performed during the hospital encounter of 01/30/19 (from the past 48 hour(s))  Lactic acid, plasma     Status: None   Collection Time: 01/30/19  2:33 PM  Result Value Ref Range   Lactic Acid, Venous 1.3 0.5 - 1.9 mmol/L    Comment: Performed at [facility], [address], [place], [place] [address]  Comprehensive metabolic panel     Status: Abnormal   Collection Time: 01/30/19  2:33 PM  Result Value Ref Range   Sodium 135 135 -  145 mmol/L   Potassium 4.3 3.5 - 5.1 mmol/L   Chloride 101 98 - 111 mmol/L   CO2 22 22 - 32 mmol/L   Glucose, Bld 169 (H) 70 - 99 mg/dL   BUN 27 (H) 8 - 23 mg/dL   Creatinine, Ser 1.58 (H) 0.61 - 1.24 mg/dL   Calcium 9.0 8.9 - 10.3 mg/dL   Total Protein 8.0 6.5 - 8.1 g/dL   Albumin 2.7 (L) 3.5 - 5.0 g/dL   AST 87 (H) 15 - 41 U/L   ALT 71 (H) 0 - 44 U/L   Alkaline Phosphatase 201 (H) 38 - 126 U/L   Total Bilirubin 0.9 0.3 - 1.2 mg/dL   GFR calc non Af Amer 40 (L) >60 mL/min   GFR calc Af Amer 46 (L) >60 mL/min   Anion gap 12 5 - 15    Comment: Performed at [facility], [place] [address] [place], [place] [address]  CBC with Differential     Status: Abnormal   Collection Time: 01/30/19  2:33 PM  Result Value Ref Range   WBC 11.5 (H) 4.0 - 10.5 K/uL   RBC 3.92 (L) 4.22 - 5.81 MIL/uL   Hemoglobin 11.4 (L) 13.0 - 17.0 g/dL   HCT 36.7 (L) 39.0 - 52.0 %   MCV 93.6 80.0 - 100.0 fL   MCH 29.1 26.0 - 34.0 pg   MCHC 31.1 30.0 - 36.0 g/dL   RDW 13.5 11.5 - 15.5 %   Platelets 238 150 - 400 K/uL   nRBC 0.0 0.0 - 0.2 %   Neutrophils Relative % 77 %   Neutro Abs 9.0 (H) 1.7 - 7.7 K/uL   Lymphocytes Relative 11 %   Lymphs Abs 1.3 0.7 - 4.0 K/uL   Monocytes Relative 7 %   Monocytes Absolute 0.8 0.1 - 1.0 K/uL   Eosinophils Relative 3 %   Eosinophils Absolute 0.3 0.0 - 0.5 K/uL   Basophils Relative 1 %   Basophils Absolute 0.1 0.0 - 0.1 K/uL   Immature Granulocytes 1 %   Abs Immature Granulocytes 0.09 (H) 0.00 - 0.07 K/uL    Comment: Performed at [facility] [address], [place], [place] [address]  SARS CORONAVIRUS 2 (TAT 6-24 HRS) Nasopharyngeal Nasopharyngeal Swab     Status: None   Collection Time: 01/30/19  3:50 PM   Specimen: Nasopharyngeal Swab  Result Value Ref Range   SARS Coronavirus 2 NEGATIVE NEGATIVE    Comment: (NOTE) SARS-CoV-2 target nucleic acids are NOT DETECTED. The SARS-CoV-2 RNA is generally detectable in upper and lower respiratory specimens during  the acute phase of infection. Negative results do not preclude SARS-CoV-2 infection, do not rule out co-infections with other pathogens, and should not be used as the sole basis for treatment or other patient management decisions.  Negative results must be combined with clinical observations, patient history, and epidemiological information. The expected result is Negative. Fact Sheet for Patients: SugarRoll.be Fact Sheet for Healthcare Providers: https://www.woods-[place].com/ This test is not yet approved or cleared by the [place] FDA and  has been authorized for detection and/or diagnosis of SARS-CoV-2 by FDA under an Emergency Use Authorization (EUA). This EUA will remain  in effect (meaning this test can be used) for the duration of the COVID-19 declaration under Section 56 4(b)(1) of the Act, 21 U.S.C. section 360bbb-3(b)(1), unless the authorization is terminated or revoked sooner. Performed at [facility], [place] [address], [place], [place] [address]   Lactic acid, plasma     Status: None   Collection Time: 01/30/19  4:02 PM  Result Value Ref Range   Lactic Acid, Venous 1.5 0.5 - 1.9 mmol/L    Comment: Performed at [facility] [address], [place], [place] [address]   Dg Foot Complete Right  Result Date: 01/30/2019 CLINICAL DATA:  Foot infection EXAM: RIGHT FOOT COMPLETE - 3+ VIEW COMPARISON:  Most recent prior 01/27/2019 FINDINGS: Diffuse soft tissue swelling of the lower extremity focal soft tissue gas and ulceration along the medial aspect of the forefoot at the level of the head of the first metatarsal. New crescentic radiodensity is seen along the anterior aspect of the ulceration which could reflect a small foreign body or debris. No convincing osseous erosion, immature periostitis or subcortical lucency to suggest early features of osteomyelitis. Mild degenerative changes in the fore- and midfoot are unchanged  from prior study. Plantar calcaneal spurring is noted. Vascular calcium in the soft tissues. IMPRESSION: Diffuse soft tissue swelling of the lower extremity with ulceration along the medial aspect of the forefoot at the level of the head of the first metatarsal. No radiographic features of osteomyelitis. If there is concern clinically, MRI could be obtained. New crescentic radiodensity along the anterior aspect of the ulceration which could reflect a small foreign body or debris. Electronically Signed   By: Lovena Le M.D.   On: 01/30/2019 16:09    Pending Labs Unresulted Labs (From admission, onward)    Start     Ordered   02/06/19 0500  Creatinine, serum  (enoxaparin (LOVENOX)    CrCl >/= 30 ml/min)  Weekly,   R    Comments: while on enoxaparin therapy    01/30/19 1647   01/31/19 0500  Comprehensive metabolic panel  Tomorrow morning,   R     01/30/19 1647   01/31/19 0500  CBC  Tomorrow morning,   R     01/30/19 1647   01/30/19 1649  Hepatitis panel, acute  Add-on,   AD     01/30/19 1648   01/30/19 1649  Lipid panel  Add-on,   AD     01/30/19 1649   01/30/19 1646  CBC  (enoxaparin (LOVENOX)    CrCl >/= 30 ml/min)  Once,   STAT    Comments: Baseline for enoxaparin therapy IF NOT ALREADY DRAWN.  Notify MD if PLT < 100 K.    01/30/19 1647   01/30/19 1646  Creatinine, serum  (enoxaparin (LOVENOX)    CrCl >/= 30 ml/min)  Once,   STAT    Comments: Baseline for enoxaparin therapy IF NOT ALREADY DRAWN.    01/30/19 1647   01/30/19 1443  Blood culture (routine x 2)  BLOOD CULTURE X 2,   STAT     01/30/19 1443  01/30/19 1433  Urinalysis, Routine w reflex microscopic  ONCE - STAT,   STAT     01/30/19 1432          Vitals/Pain Today's Vitals   01/30/19 1425 01/30/19 1431  BP: 129/74   Pulse: (!) 52   Resp: 16   Temp: 98.2 F (36.8 C)   TempSrc: Oral   SpO2: 98%   Weight:  90.7 kg  Height:  6' (1.829 m)  PainSc:  4     Isolation Precautions No active  isolations  Medications Medications  sodium chloride flush (NS) 0.9 % injection 3 mL (3 mLs Intravenous Not Given 01/30/19 1711)  vancomycin (VANCOCIN) 1,250 mg in sodium chloride 0.9 % 250 mL IVPB (has no administration in time range)  enoxaparin (LOVENOX) injection 40 mg (has no administration in time range)  acetaminophen (TYLENOL) tablet 650 mg (has no administration in time range)    Or  acetaminophen (TYLENOL) suppository 650 mg (has no administration in time range)  ondansetron (ZOFRAN) tablet 4 mg (has no administration in time range)    Or  ondansetron (ZOFRAN) injection 4 mg (has no administration in time range)  oxyCODONE (Oxy IR/ROXICODONE) immediate release tablet 10 mg (has no administration in time range)  aspirin EC tablet 81 mg (has no administration in time range)  amLODipine (NORVASC) tablet 10 mg (has no administration in time range)  carvedilol (COREG) tablet 3.125 mg (has no administration in time range)  furosemide (LASIX) tablet 60 mg (60 mg Oral Given 01/30/19 2220)  omega-3 acid ethyl esters (LOVAZA) capsule 1 g (has no administration in time range)  rosuvastatin (CRESTOR) tablet 10 mg (has no administration in time range)  sacubitril-valsartan (ENTRESTO) 49-51 mg per tablet (has no administration in time range)  ferrous sulfate tablet 325 mg (has no administration in time range)  piperacillin-tazobactam (ZOSYN) IVPB 3.375 g (0 g Intravenous Stopped 01/30/19 1855)  vancomycin (VANCOCIN) 2,000 mg in sodium chloride 0.9 % 500 mL IVPB (0 mg Intravenous Stopped 01/30/19 2215)    Mobility walks with person assist Moderate fall risk   Focused Assessments   R Recommendations: See Admitting Provider Note  Report given to:   Additional Notes:

## 2019-01-30 NOTE — ED Provider Notes (Addendum)
[facility] EMERGENCY DEPARTMENT Provider Note   CSN: [id number] Arrival date & time: 01/30/19  1422     History   Chief Complaint Chief Complaint  Patient presents with  . Wound Infection    HPI Robert Horn is a 85 y.o. male.     HPI  85 year old male presents with right foot infection.  He states overall it has been going on for about a week since he injured it.  He was seen here a few days ago and started on Keflex.  No fevers but he feels like the redness and swelling is worsening.  He has drainage from his foot.  Past Medical History:  Diagnosis Date  . CHF (congestive heart failure) ([place])   . Dyslipidemia 10/27/2015  . Dyspnea    w/ exertion   . Elevated PSA 10/27/2015  . Erectile dysfunction 10/27/2015  . Heart murmur   . Hypertension 10/27/2015  . Hypogonadism male 10/27/2015  . Obesity 10/27/2015  . Pneumonia 10/27/2015   pt states was 1982  . Rotator cuff tear 10/27/2015    Patient Active Problem List   Diagnosis Date Noted  . Cellulitis of right foot 01/30/2019  . CKD (chronic kidney disease), stage III 01/30/2019  . Elevated LFTs 01/30/2019  . Colon cancer ([place]) 01/30/2019  . Acute on chronic combined systolic and diastolic CHF (congestive heart failure) ([place]) 03/09/2018  . AKI (acute kidney injury) ([place]) 03/09/2018  . Colonic mass 12/10/2017  . S/P TAVR (transcatheter aortic valve replacement) 03/03/2017  . Severe aortic stenosis 12/15/2016  . Essential hypertension 05/09/2013  . Hyperlipidemia 05/09/2013  . PVC's (premature ventricular contractions) 05/09/2013  . Chronic combined systolic and diastolic heart failure ([place]) 05/09/2013    Past Surgical History:  Procedure Laterality Date  . CARDIAC CATHETERIZATION N/A 11/14/2015   Procedure: Left Heart Cath and Coronary Angiography;  Surgeon: [name], MD;  Location: [facility];  Service: Cardiovascular;  Laterality: N/A;  . LAPAROSCOPIC PARTIAL COLECTOMY   12/15/2017   LAPAROSCOPIC PARTIAL COLECTOMY (N/A Abdomen)  . LAPAROSCOPIC PARTIAL COLECTOMY N/A 12/15/2017   Procedure: LAPAROSCOPIC PARTIAL COLECTOMY;  Surgeon: Erroll Luna, MD;  Location: [facility];  Service: General;  Laterality: N/A;  . TEE WITHOUT CARDIOVERSION N/A 12/15/2016   Procedure: TRANSESOPHAGEAL ECHOCARDIOGRAM (TEE);  Surgeon: Sherren Mocha, MD;  Location: [facility];  Service: Open Heart Surgery;  Laterality: N/A;  . TRANSCATHETER AORTIC VALVE REPLACEMENT, TRANSFEMORAL N/A 12/15/2016   Procedure: TRANSCATHETER AORTIC VALVE REPLACEMENT, TRANSFEMORAL;  Surgeon: Sherren Mocha, MD;  Location: [facility];  Service: Open Heart Surgery;  Laterality: N/A;        Home Medications    Prior to Admission medications   Medication Sig Start Date End Date Taking? Authorizing Provider  amLODipine (NORVASC) 10 MG tablet Take 1 tablet (10 mg total) by mouth daily. 03/09/18 03/04/19  Daune Perch, NP  aspirin (ASPIRIN EC) 81 MG EC tablet Take 81 mg by mouth daily. Swallow whole.    [provider]  carvedilol (COREG) 3.125 MG tablet Take 3.125 mg by mouth 2 (two) times daily with a meal.    [provider]  cephALEXin (KEFLEX) 500 MG capsule Take 1 capsule (500 mg total) by mouth 2 (two) times daily. [id number]   Delora Fuel, MD  ferrous sulfate 325 (65 FE) MG tablet Take 325 mg by mouth daily with breakfast.    [provider]  furosemide (LASIX) 40 MG tablet TAKE 1.5 TABLETS BY MOUTH DAILY. Patient taking differently: Take 60  mg by mouth daily.  01/17/19   [name], MD  Multiple Vitamin (MULTIVITAMIN WITH MINERALS) TABS tablet Take 1 tablet by mouth daily. 10/27/15   Dana Allan I, MD  omega-3 acid ethyl esters (LOVAZA) 1 g capsule TAKE ONE CAPSULE BY MOUTH EVERY DAY Patient taking differently: Take 1 g by mouth daily.  03/15/18   [name], MD  rosuvastatin (CRESTOR) 10 MG tablet Take 10 mg by mouth daily.    [provider]  sacubitril-valsartan  (ENTRESTO) 49-51 MG Take 1 tablet by mouth 2 (two) times daily. 11/18/18 11/18/19  [name], MD  VITAMIN B COMPLEX-C PO Take 1 tablet by mouth daily.    [provider]    Family History Family History  Problem Relation Age of Onset  . Diabetes Mother   . Heart disease Mother   . Pulmonary embolism Father     Social History Social History   Tobacco Use  . Smoking status: Former Smoker    Types: Cigarettes  . Smokeless tobacco: Never Used  Substance Use Topics  . Alcohol use: Yes    Comment: rarely  . Drug use: No     Allergies   Patient has no known allergies.   Review of Systems Review of Systems  Constitutional: Negative for fever.  Musculoskeletal: Positive for arthralgias and joint swelling.  Skin: Positive for color change and wound.  All other systems reviewed and are negative.    Physical Exam Updated Vital Signs BP 129/74   Pulse (!) 52   Temp 98.2 F (36.8 C) (Oral)   Resp 16   Ht 6' (1.829 m)   Wt 90.7 kg   SpO2 98%   BMI 27.12 kg/m   Physical Exam Vitals signs and nursing note reviewed.  Constitutional:      Appearance: He is well-developed.  HENT:     Head: Normocephalic and atraumatic.     Right Ear: External ear normal.     Left Ear: External ear normal.     Nose: Nose normal.  Eyes:     General:        Right eye: No discharge.        Left eye: No discharge.  Neck:     Musculoskeletal: Neck supple.  Cardiovascular:     Rate and Rhythm: Normal rate and regular rhythm.     Pulses:          Dorsalis pedis pulses are detected w/ Doppler on the right side.  Pulmonary:     Effort: Pulmonary effort is normal.  Abdominal:     General: There is no distension.  Musculoskeletal:     Comments: See Picture. No significant tenderness. Diffuse swelling. Foul-smelling, thought not much drainage.  Skin:    General: Skin is warm and dry.  Neurological:     Mental Status: He is alert.  Psychiatric:        Mood and Affect: Mood  is not anxious.        ED Treatments / Results  Labs (all labs ordered are listed, but only abnormal results are displayed) Labs Reviewed  COMPREHENSIVE METABOLIC PANEL - Abnormal; Notable for the following components:      Result Value   Glucose, Bld 169 (*)    BUN 27 (*)    Creatinine, Ser 1.58 (*)    Albumin 2.7 (*)    AST 87 (*)    ALT 71 (*)    Alkaline Phosphatase 201 (*)  GFR calc non Af Amer 40 (*)    GFR calc Af Amer 46 (*)    All other components within normal limits  CBC WITH DIFFERENTIAL/PLATELET - Abnormal; Notable for the following components:   WBC 11.5 (*)    RBC 3.92 (*)    Hemoglobin 11.4 (*)    HCT 36.7 (*)    Neutro Abs 9.0 (*)    Abs Immature Granulocytes 0.09 (*)    All other components within normal limits  CULTURE, BLOOD (ROUTINE X 2)  CULTURE, BLOOD (ROUTINE X 2)  SARS CORONAVIRUS 2 (TAT 6-24 HRS)  LACTIC ACID, PLASMA  LACTIC ACID, PLASMA  URINALYSIS, ROUTINE W REFLEX MICROSCOPIC  CBC  CREATININE, SERUM  HEPATITIS PANEL, ACUTE  LIPID PANEL    EKG None  Radiology Dg Foot Complete Right  Result Date: 01/30/2019 CLINICAL DATA:  Foot infection EXAM: RIGHT FOOT COMPLETE - 3+ VIEW COMPARISON:  Most recent prior 01/27/2019 FINDINGS: Diffuse soft tissue swelling of the lower extremity focal soft tissue gas and ulceration along the medial aspect of the forefoot at the level of the head of the first metatarsal. New crescentic radiodensity is seen along the anterior aspect of the ulceration which could reflect a small foreign body or debris. No convincing osseous erosion, immature periostitis or subcortical lucency to suggest early features of osteomyelitis. Mild degenerative changes in the fore- and midfoot are unchanged from prior study. Plantar calcaneal spurring is noted. Vascular calcium in the soft tissues. IMPRESSION: Diffuse soft tissue swelling of the lower extremity with ulceration along the medial aspect of the forefoot at the level of the  head of the first metatarsal. No radiographic features of osteomyelitis. If there is concern clinically, MRI could be obtained. New crescentic radiodensity along the anterior aspect of the ulceration which could reflect a small foreign body or debris. Electronically Signed   By: Lovena Le M.D.   On: 01/30/2019 16:09    Procedures Procedures (including critical care time)  Medications Ordered in ED Medications  sodium chloride flush (NS) 0.9 % injection 3 mL (has no administration in time range)  piperacillin-tazobactam (ZOSYN) IVPB 3.375 g (has no administration in time range)  vancomycin (VANCOCIN) 2,000 mg in sodium chloride 0.9 % 500 mL IVPB (has no administration in time range)  vancomycin (VANCOCIN) 1,250 mg in sodium chloride 0.9 % 250 mL IVPB (has no administration in time range)  enoxaparin (LOVENOX) injection 40 mg (has no administration in time range)  acetaminophen (TYLENOL) tablet 650 mg (has no administration in time range)    Or  acetaminophen (TYLENOL) suppository 650 mg (has no administration in time range)  ondansetron (ZOFRAN) tablet 4 mg (has no administration in time range)    Or  ondansetron (ZOFRAN) injection 4 mg (has no administration in time range)     Initial Impression / Assessment and Plan / ED Course  I have reviewed the triage vital signs and the nursing notes.  Pertinent labs & imaging results that were available during my care of the patient were reviewed by me and considered in my medical decision making (see chart for details).        Patient has had worsening cellulitis of the right foot.  I do not see any obvious foreign bodies on external exam.  He will need IV antibiotics given he has failed outpatient antibiotics.  Hospitalist will admit.    Final Clinical Impressions(s) / ED Diagnoses   Final diagnoses:  Cellulitis of right foot    ED Discharge  Orders    None       Sherwood Gambler, MD 01/30/19 1655    Sherwood Gambler, MD  01/30/19 [phone]

## 2019-01-31 ENCOUNTER — Inpatient Hospital Stay (HOSPITAL_COMMUNITY): Payer: Medicare Other

## 2019-01-31 ENCOUNTER — Ambulatory Visit: Payer: Medicare Other | Admitting: Podiatry

## 2019-01-31 ENCOUNTER — Other Ambulatory Visit: Payer: Self-pay | Admitting: Physician Assistant

## 2019-01-31 DIAGNOSIS — E1142 Type 2 diabetes mellitus with diabetic polyneuropathy: Secondary | ICD-10-CM

## 2019-01-31 DIAGNOSIS — I96 Gangrene, not elsewhere classified: Secondary | ICD-10-CM

## 2019-01-31 DIAGNOSIS — I739 Peripheral vascular disease, unspecified: Secondary | ICD-10-CM

## 2019-01-31 DIAGNOSIS — M869 Osteomyelitis, unspecified: Secondary | ICD-10-CM

## 2019-01-31 DIAGNOSIS — E119 Type 2 diabetes mellitus without complications: Secondary | ICD-10-CM

## 2019-01-31 DIAGNOSIS — L02611 Cutaneous abscess of right foot: Secondary | ICD-10-CM

## 2019-01-31 DIAGNOSIS — I350 Nonrheumatic aortic (valve) stenosis: Secondary | ICD-10-CM

## 2019-01-31 DIAGNOSIS — E43 Unspecified severe protein-calorie malnutrition: Secondary | ICD-10-CM

## 2019-01-31 LAB — CBC
HCT: 32.2 % — ABNORMAL LOW (ref 39.0–52.0)
HCT: 31.8 % — ABNORMAL LOW (ref 39.0–52.0)
Hemoglobin: 10.3 g/dL — ABNORMAL LOW (ref 13.0–17.0)
Hemoglobin: 10.3 g/dL — ABNORMAL LOW (ref 13.0–17.0)
MCH: 29.1 pg (ref 26.0–34.0)
MCH: 29.5 pg (ref 26.0–34.0)
MCHC: 32 g/dL (ref 30.0–36.0)
MCHC: 32.4 g/dL (ref 30.0–36.0)
MCV: 91 fL (ref 80.0–100.0)
MCV: 91.1 fL (ref 80.0–100.0)
Platelets: UNDETERMINED 10*3/uL (ref 150–400)
Platelets: 185 10*3/uL (ref 150–400)
RBC: 3.54 MIL/uL — ABNORMAL LOW (ref 4.22–5.81)
RBC: 3.49 MIL/uL — ABNORMAL LOW (ref 4.22–5.81)
RDW: 13.2 % (ref 11.5–15.5)
RDW: 13.3 % (ref 11.5–15.5)
WBC: 10.6 10*3/uL — ABNORMAL HIGH (ref 4.0–10.5)
WBC: 10.3 10*3/uL (ref 4.0–10.5)
nRBC: 0 % (ref 0.0–0.2)
nRBC: 0 % (ref 0.0–0.2)

## 2019-01-31 LAB — HEPATITIS PANEL, ACUTE
HCV Ab: NONREACTIVE
Hep A IgM: NONREACTIVE
Hep B C IgM: NONREACTIVE
Hepatitis B Surface Ag: NONREACTIVE

## 2019-01-31 LAB — CULTURE, BLOOD (ROUTINE X 2)
Culture: NO GROWTH
Culture: NO GROWTH
Special Requests: ADEQUATE
Special Requests: ADEQUATE

## 2019-01-31 LAB — CREATININE, SERUM
Creatinine, Ser: 1.52 mg/dL — ABNORMAL HIGH (ref 0.61–1.24)
GFR calc Af Amer: 48 mL/min — ABNORMAL LOW (ref >60)
GFR calc non Af Amer: 42 mL/min — ABNORMAL LOW (ref >60)

## 2019-01-31 LAB — LIPID PANEL
Cholesterol: 110 mg/dL (ref 0–200)
HDL: 22 mg/dL — ABNORMAL LOW (ref >40)
LDL Cholesterol: 71 mg/dL (ref 0–99)
Total CHOL/HDL Ratio: 5 RATIO
Triglycerides: 87 mg/dL (ref <150)
VLDL: 17 mg/dL (ref 0–40)

## 2019-01-31 LAB — COMPREHENSIVE METABOLIC PANEL
ALT: 65 U/L — ABNORMAL HIGH (ref 0–44)
AST: 69 U/L — ABNORMAL HIGH (ref 15–41)
Albumin: 2.3 g/dL — ABNORMAL LOW (ref 3.5–5.0)
Alkaline Phosphatase: 170 U/L — ABNORMAL HIGH (ref 38–126)
Anion gap: 9 (ref 5–15)
BUN: 27 mg/dL — ABNORMAL HIGH (ref 8–23)
CO2: 21 mmol/L — ABNORMAL LOW (ref 22–32)
Calcium: 8.7 mg/dL — ABNORMAL LOW (ref 8.9–10.3)
Chloride: 108 mmol/L (ref 98–111)
Creatinine, Ser: 1.39 mg/dL — ABNORMAL HIGH (ref 0.61–1.24)
GFR calc Af Amer: 54 mL/min — ABNORMAL LOW (ref >60)
GFR calc non Af Amer: 47 mL/min — ABNORMAL LOW (ref >60)
Glucose, Bld: 120 mg/dL — ABNORMAL HIGH (ref 70–99)
Potassium: 4.4 mmol/L (ref 3.5–5.1)
Sodium: 138 mmol/L (ref 135–145)
Total Bilirubin: 0.6 mg/dL (ref 0.3–1.2)
Total Protein: 6.5 g/dL (ref 6.5–8.1)

## 2019-01-31 LAB — HEMOGLOBIN A1C
Hgb A1c MFr Bld: 7 % — ABNORMAL HIGH (ref 4.8–5.6)
Mean Plasma Glucose: 154.2 mg/dL

## 2019-01-31 MED ORDER — PIPERACILLIN-TAZOBACTAM 3.375 G IVPB
3.3750 g | Freq: Three times a day (TID) | INTRAVENOUS | Status: DC
  Administered 2019-01-31 – 2019-02-03 (×7): 3.375 g via INTRAVENOUS
  Filled 2019-01-31 (×7): qty 50

## 2019-01-31 NOTE — Consult Note (Signed)
[facility] Nurse wound consult note This consult is performed remotely using photography and with assistance from bedside RN. Please see photographs taken yesterday afternoon by Dr. Doristine Bosworth in the [facility] ED embedded into her note. Patient has failed outpatient antibiotic therapy. Reason for Consult: Infection (cellulitis) with full thickness wound to right foot, medial aspect (forefoot) with ulceration at dorsal aspect Wound type:Infectious Pressure Injury POA: N/A Measurement:Per flowsheet, Bedside RN Dante Gang documents wound as 3cm x 3.5 and with depth not abe to be determined due to necrotic wound bed Wound bed: Drainage (amount, consistency, odor)  Periwound: yellow/grey discoloration to the distal area (toward toe) indicative of infection Dressing procedure/placement/frequency:  I have provided Nursing staff with a conservative dressing (xeroform gauze) to be applied twice daily, each time after cleansing. Securement is to be with a few turns of Kerlix roll gauze/paper tape. X-rays have indicated that osteomyelitis is NOT present.  Recommend consultation with Orthopedics or Vascular Surgery for wound debridement.  If you agree, please Order/arrange.   Thank you for inviting [place] to consult on this patient.    [facility] nursing team will not follow, but will remain available to this patient, the nursing and medical teams.  Please re-consult if needed. Thanks, Maudie Flakes, MSN, RN, [place], Arther Abbott  Pager# [phone]

## 2019-01-31 NOTE — Progress Notes (Signed)
ABI's have has been completed. Preliminary results can be found in CV Proc through chart review.   01/31/19 11:41 AM Robert Horn RVT

## 2019-01-31 NOTE — Progress Notes (Signed)
Pt had a 9 beat run of vtach. No complaints voiced by pt when checked MD text paged

## 2019-01-31 NOTE — H&P (View-Only) (Signed)
ORTHOPAEDIC CONSULTATION  REQUESTING PHYSICIAN: Pahwani, Michell Heinrich, MD  Chief Complaint: Ulceration right great toe MTP joint.  HPI: Robert Horn is a 85 y.o. male who presents with necrotic ulcer cellulitis abscess and osteomyelitis right great toe MTP joint.  Patient has undiagnosed type 2 diabetes.  Patient states that he rolled his ankle and afterwards played a round of golf.  Patient states he started developing pain the great toe MTP joint.  He went to the emergency room he states he was there for 6 hours was given an antibiotic and was discharged to home.  Patient returns 2 days later with large necrotic ulceration of the right great toe MTP joint.  Patient states that he had a initial appointment with podiatry in several days for his toe.  Patient is not a smoker he has had no history of diabetes.  Past Medical History:  Diagnosis Date  . CHF (congestive heart failure) ([place])   . Dyslipidemia 10/27/2015  . Dyspnea    w/ exertion   . Elevated PSA 10/27/2015  . Erectile dysfunction 10/27/2015  . Heart murmur   . Hypertension 10/27/2015  . Hypogonadism male 10/27/2015  . Obesity 10/27/2015  . Pneumonia 10/27/2015   pt states was 1982  . Rotator cuff tear 10/27/2015   Past Surgical History:  Procedure Laterality Date  . CARDIAC CATHETERIZATION N/A 11/14/2015   Procedure: Left Heart Cath and Coronary Angiography;  Surgeon: [name], MD;  Location: [facility];  Service: Cardiovascular;  Laterality: N/A;  . LAPAROSCOPIC PARTIAL COLECTOMY  12/15/2017   LAPAROSCOPIC PARTIAL COLECTOMY (N/A Abdomen)  . LAPAROSCOPIC PARTIAL COLECTOMY N/A 12/15/2017   Procedure: LAPAROSCOPIC PARTIAL COLECTOMY;  Surgeon: Erroll Luna, MD;  Location: [facility];  Service: General;  Laterality: N/A;  . TEE WITHOUT CARDIOVERSION N/A 12/15/2016   Procedure: TRANSESOPHAGEAL ECHOCARDIOGRAM (TEE);  Surgeon: Sherren Mocha, MD;  Location: [facility];  Service: Open Heart Surgery;  Laterality: N/A;  .  TRANSCATHETER AORTIC VALVE REPLACEMENT, TRANSFEMORAL N/A 12/15/2016   Procedure: TRANSCATHETER AORTIC VALVE REPLACEMENT, TRANSFEMORAL;  Surgeon: Sherren Mocha, MD;  Location: [facility];  Service: Open Heart Surgery;  Laterality: N/A;   Social History   Socioeconomic History  . Marital status: Single    Spouse name: Not on file  . Number of children: Not on file  . Years of education: Not on file  . Highest education level: Not on file  Occupational History  . Not on file  Social Needs  . Financial resource strain: Not on file  . Food insecurity    Worry: Not on file    Inability: Not on file  . Transportation needs    Medical: Not on file    Non-medical: Not on file  Tobacco Use  . Smoking status: Former Smoker    Types: Cigarettes  . Smokeless tobacco: Never Used  Substance and Sexual Activity  . Alcohol use: Yes    Comment: rarely  . Drug use: No  . Sexual activity: Not on file  Lifestyle  . Physical activity    Days per week: Not on file    Minutes per session: Not on file  . Stress: Not on file  Relationships  . Social Herbalist on phone: Not on file    Gets together: Not on file    Attends religious service: Not on file    Active member of club or organization: Not on file    Attends meetings of clubs or organizations: Not  on file    Relationship status: Not on file  Other Topics Concern  . Not on file  Social History Narrative  . Not on file   Family History  Problem Relation Age of Onset  . Diabetes Mother   . Heart disease Mother   . Pulmonary embolism Father    - negative except otherwise stated in the family history section No Known Allergies Prior to Admission medications   Medication Sig Start Date End Date Taking? Authorizing Provider  amLODipine (NORVASC) 10 MG tablet Take 1 tablet (10 mg total) by mouth daily. 03/09/18 03/04/19 Yes Daune Perch, NP  aspirin (ASPIRIN EC) 81 MG EC tablet Take 81 mg by mouth daily. Swallow whole.   Yes  [provider]  carvedilol (COREG) 3.125 MG tablet Take 3.125 mg by mouth 2 (two) times daily with a meal.   Yes [provider]  cephALEXin (KEFLEX) 500 MG capsule Take 1 capsule (500 mg total) by mouth 2 (two) times daily. 123XX123  Yes Delora Fuel, MD  ferrous sulfate 325 (65 FE) MG tablet Take 325 mg by mouth daily with breakfast.   Yes [provider]  furosemide (LASIX) 40 MG tablet TAKE 1.5 TABLETS BY MOUTH DAILY. Patient taking differently: Take 60 mg by mouth daily.  01/17/19  Yes [name], MD  Multiple Vitamin (MULTIVITAMIN WITH MINERALS) TABS tablet Take 1 tablet by mouth daily. 10/27/15  Yes Dana Allan I, MD  omega-3 acid ethyl esters (LOVAZA) 1 g capsule TAKE ONE CAPSULE BY MOUTH EVERY DAY Patient taking differently: Take 1 g by mouth daily.  03/15/18  Yes [name], MD  rosuvastatin (CRESTOR) 10 MG tablet Take 10 mg by mouth daily.   Yes [provider]  sacubitril-valsartan (ENTRESTO) 49-51 MG Take 1 tablet by mouth 2 (two) times daily. 11/18/18 11/18/19 Yes [name], MD  VITAMIN B COMPLEX-C PO Take 1 tablet by mouth daily.   Yes [provider]   Dg Foot Complete Right  Result Date: 01/30/2019 CLINICAL DATA:  Foot infection EXAM: RIGHT FOOT COMPLETE - 3+ VIEW COMPARISON:  Most recent prior 01/27/2019 FINDINGS: Diffuse soft tissue swelling of the lower extremity focal soft tissue gas and ulceration along the medial aspect of the forefoot at the level of the head of the first metatarsal. New crescentic radiodensity is seen along the anterior aspect of the ulceration which could reflect a small foreign body or debris. No convincing osseous erosion, immature periostitis or subcortical lucency to suggest early features of osteomyelitis. Mild degenerative changes in the fore- and midfoot are unchanged from prior study. Plantar calcaneal spurring is noted. Vascular calcium in the soft tissues. IMPRESSION: Diffuse soft tissue  swelling of the lower extremity with ulceration along the medial aspect of the forefoot at the level of the head of the first metatarsal. No radiographic features of osteomyelitis. If there is concern clinically, MRI could be obtained. New crescentic radiodensity along the anterior aspect of the ulceration which could reflect a small foreign body or debris. Electronically Signed   By: Lovena Le M.D.   On: 01/30/2019 16:09   - pertinent xrays, CT, MRI studies were reviewed and independently interpreted  Positive ROS: All other systems have been reviewed and were otherwise negative with the exception of those mentioned in the HPI and as above.  Physical Exam: General: Alert, no acute distress Psychiatric: Patient is competent for consent with normal mood and affect Lymphatic: No axillary or cervical lymphadenopathy Cardiovascular: No pedal  edema Respiratory: No cyanosis, no use of accessory musculature GI: No organomegaly, abdomen is soft and non-tender    Images:  @ENCIMAGES @  Labs:  Lab Results  Component Value Date   HGBA1C 6.4 (H) 12/13/2017   HGBA1C 6.4 (H) 12/09/2016   HGBA1C 6.4 (H) 10/27/2015   REPTSTATUS PENDING 01/26/2019   CULT  01/26/2019    NO GROWTH 4 DAYS Performed at [facility], [place] [address] [place], [place] [address]     Lab Results  Component Value Date   ALBUMIN 2.3 (L) 01/31/2019   ALBUMIN 2.7 (L) 01/30/2019   ALBUMIN 3.0 (L) 01/26/2019    Neurologic: Patient does not have protective sensation bilateral lower extremities.   MUSCULOSKELETAL:   Skin: Examination patient has a large necrotic ulcer over the medial border right great toe MTP joint.  The necrotic ulcer extends down to bone consistent with osteomyelitis and gangrenous changes consistent with a circulatory problem.  Patient has a strong femoral pulse I cannot palpate a popliteal pulse or a dorsalis pedis pulse.  His foot is warm.  I ordered a hemoglobin A1c and his A1c is 7.0.   His albumin is 2.3 patient also has elevated chronic renal function and elevated liver function studies.  Assessment: Assessment: Undiagnosed type 2 diabetes with peripheral vascular disease with osteomyelitis and gangrenous abscess of the MTP joint right great toe.  Plan: Plan: I have ordered ankle-brachial indices, also recommending vascular vein surgery for consultation, they may need to get involved with the vascular status of the right lower extremity.  Patient also request a second opinion and I think vascular surgery would be an excellent source for a second opinion.  I discussed with the patient recommendations to proceed with removing the abscess necrotic tissue, with a first ray amputation, on Wednesday but could wait until Friday.  Patient states he understands he will discuss this with his wife who is coming up to the floor shortly and I gave him my cell phone number if he has any other questions.  Thank you for the consult and the opportunity to see Mr. Shakai Fieger, [place] [phone] 6:49 AM

## 2019-01-31 NOTE — Progress Notes (Addendum)
PROGRESS NOTE    Robert Horn  [id number] DOB: Jan 06, 1934 DOA: 01/30/2019 PCP: [name], MD  Brief Narrative: Robert Horn is a 85 y.o. male with medical history significant of hypertension, hyperlipidemia, combined systolic and diastolic congestive heart failure with ejection fraction of 40 to 45%, severe aortic stenosis s/p TAVR, colon mass s/p laparoscopic partial colectomy on 12/15/2017 presents to emergency department due to worsening right foot pain, ulcer, swelling and redness since 10 days.  Patient reports that he stepped on something in his yard and he twisted his right foot inside of shoes.  Since then he has swelling, redness and severe pain in his right foot.  Reports that he came to the emergency department on 01/27/2019 and was prescribed Keflex however his wound continued to worsen hence presented back to the ED again on 11/2 evening and was admitted  Assessment & Plan:   Right foot, necrotic wound with surrounding cellulitis -Unable to appreciate dorsalis pedis pulses -Unfortunately wound is very close to the bone, requested orthopedic consult, discussed with Dr. Sharol Given, also ordered ABIs, VVS consult -Continue IV vancomycin and Zosyn -Blood cultures negative x1 day  Hyperglycemia/New diabetes mellitus -Patient denies history of diabetes -Hemoglobin A1c is 7.0 which indicates diagnosis of new diabetes -Previous A1c's in 2019 were in the 6.4 range which are consistent with borderline diabetes  Chronic systolic and diastolic CHF -Last echo with EF of 40 to 45% -Clinically appears euvolemic, continue Lasix Entresto Coreg -Monitor volume status, monitor kidney function while on vancomycin and Entresto  Stage III chronic kidney disease -Stable  Anemia of chronic disease and iron deficiency -Stable, continue ferrous sulfate  History of severe aortic stenosis, s/p TAVR  History of adenocarcinoma of the colon -Treated with partial colectomy in 11/2017  Mildly  elevated LFTs -Could be fatty liver disease versus sepsis related, abdominal exam is benign, this is improving, monitor  DVT prophylaxis: Lovenox Code Status: Full code Family Communication: No family at bedside Disposition Plan:   Consultants:   Orthopedics Dr. Sharol Given  Vascular surgery   Procedures:   Antimicrobials: IV vancomycin and Zosyn from 11/2   Subjective: -Feels okay, has discomfort in his right foot  Objective: Vitals:   01/30/19 2249 01/30/19 2332 01/31/19 0220 01/31/19 0503  BP: 133/80   (!) 131/92  Pulse:   64 62  Resp: 18   18  Temp:    98.1 F (36.7 C)  TempSrc:    Oral  SpO2: 95%   97%  Weight:  90.7 kg    Height:  6' (1.829 m)      Intake/Output Summary (Last 24 hours) at 01/31/2019 1250 Last data filed at 01/31/2019 0400 Gross per 24 hour  Intake 0 ml  Output 300 ml  Net -300 ml   Filed Weights   01/30/19 1431 01/30/19 2332  Weight: 90.7 kg 90.7 kg    Examination:  General exam: Appears calm and comfortable, no distress, AAOx3 Respiratory system: Clear to auscultation Cardiovascular system: S1 & S2 heard, RRR  Gastrointestinal system: Abdomen is nondistended, soft and nontender.Normal bowel sounds heard. Central nervous system: Alert and oriented. No focal neurological deficits. Extremities: Right foot with necrotic wound bed, with surrounding sloughing skin Skin: As above Psychiatry: Judgement and insight appear normal. Mood & affect appropriate.     Data Reviewed:   CBC: Recent Labs  Lab 01/26/19 1951 01/30/19 1433 01/30/19 2356 01/31/19 0324  WBC 16.0* 11.5* 10.6* 10.3  NEUTROABS 13.1* 9.0*  --   --   HGB  11.0* 11.4* 10.3* 10.3*  HCT 33.8* 36.7* 32.2* 31.8*  MCV 91.4 93.6 91.0 91.1  PLT 225 238 PLATELET CLUMPS NOTED ON SMEAR, UNABLE TO ESTIMATE 123XX123   Basic Metabolic Panel: Recent Labs  Lab 01/26/19 1951 01/30/19 1433 01/30/19 2356 01/31/19 0324  NA 135 135  --  138  K 4.3 4.3  --  4.4  CL 102 101  --  108  CO2  21* 22  --  21*  GLUCOSE 156* 169*  --  120*  BUN 26* 27*  --  27*  CREATININE 1.69* 1.58* 1.52* 1.39*  CALCIUM 8.7* 9.0  --  8.7*   GFR: Estimated Creatinine Clearance: 44.2 mL/min (A) (by C-G formula based on SCr of 1.39 mg/dL (H)). Liver Function Tests: Recent Labs  Lab 01/26/19 1951 01/30/19 1433 01/31/19 0324  AST 39 87* 69*  ALT 41 71* 65*  ALKPHOS 151* 201* 170*  BILITOT 1.1 0.9 0.6  PROT 7.5 8.0 6.5  ALBUMIN 3.0* 2.7* 2.3*   No results for input(s): LIPASE, AMYLASE in the last 168 hours. No results for input(s): AMMONIA in the last 168 hours. Coagulation Profile: Recent Labs  Lab 01/26/19 1951  INR 1.2   Cardiac Enzymes: No results for input(s): CKTOTAL, CKMB, CKMBINDEX, TROPONINI in the last 168 hours. BNP (last 3 results) Recent Labs    11/24/18 0849  PROBNP 294   HbA1C: Recent Labs    01/31/19 0324  HGBA1C 7.0*   CBG: No results for input(s): GLUCAP in the last 168 hours. Lipid Profile: Recent Labs    01/30/19 2350  CHOL 110  HDL 22*  LDLCALC 71  TRIG 87  CHOLHDL 5.0   Thyroid Function Tests: No results for input(s): TSH, T4TOTAL, FREET4, T3FREE, THYROIDAB in the last 72 hours. Anemia Panel: No results for input(s): VITAMINB12, FOLATE, FERRITIN, TIBC, IRON, RETICCTPCT in the last 72 hours. Urine analysis:    Component Value Date/Time   COLORURINE YELLOW 12/09/2016 1434   APPEARANCEUR HAZY (A) 12/09/2016 1434   LABSPEC 1.017 12/09/2016 1434   PHURINE 5.0 12/09/2016 1434   GLUCOSEU NEGATIVE 12/09/2016 1434   HGBUR NEGATIVE 12/09/2016 1434   [place] 12/09/2016 1434   KETONESUR NEGATIVE 12/09/2016 1434   PROTEINUR NEGATIVE 12/09/2016 1434   NITRITE NEGATIVE 12/09/2016 1434   LEUKOCYTESUR TRACE (A) 12/09/2016 1434   Sepsis Labs: @LABRCNTIP (procalcitonin:4,lacticidven:4)  ) Recent Results (from the past 240 hour(s))  Culture, blood (Routine x 2)     Status: None   Collection Time: 01/26/19  7:30 PM   Specimen: BLOOD  RIGHT ARM  Result Value Ref Range Status   Specimen Description BLOOD RIGHT ARM  Final   Special Requests   Final    BOTTLES DRAWN AEROBIC AND ANAEROBIC Blood Culture adequate volume   Culture   Final    NO GROWTH 5 DAYS Performed at [facility], [place] [address] [place], [place] [address]    Report Status 01/31/2019 FINAL  Final  Culture, blood (Routine x 2)     Status: None   Collection Time: 01/26/19  7:40 PM   Specimen: BLOOD RIGHT HAND  Result Value Ref Range Status   Specimen Description BLOOD RIGHT HAND  Final   Special Requests   Final    BOTTLES DRAWN AEROBIC ONLY Blood Culture adequate volume   Culture   Final    NO GROWTH 5 DAYS Performed at [facility], [place] [address] [place], [place] [address]    Report Status 01/31/2019 FINAL  Final  Blood culture (routine x 2)     Status: None (Preliminary result)   Collection Time: 01/30/19  2:49 PM   Specimen: BLOOD  Result Value Ref Range Status   Specimen Description BLOOD LEFT ANTECUBITAL  Final   Special Requests   Final    BOTTLES DRAWN AEROBIC AND ANAEROBIC Blood Culture results may not be optimal due to an inadequate volume of blood received in culture bottles   Culture   Final    NO GROWTH < 24 HOURS Performed at [facility], [place]. [address], [place], [place] [address]    Report Status PENDING  Incomplete  Blood culture (routine x 2)     Status: None (Preliminary result)   Collection Time: 01/30/19  3:37 PM   Specimen: BLOOD  Result Value Ref Range Status   Specimen Description BLOOD RIGHT ANTECUBITAL  Final   Special Requests   Final    BOTTLES DRAWN AEROBIC AND ANAEROBIC Blood Culture adequate volume   Culture   Final    NO GROWTH < 24 HOURS Performed at [facility], [place] [address], [place], [place] [address]    Report Status PENDING  Incomplete  SARS CORONAVIRUS 2 (TAT 6-24 HRS) Nasopharyngeal Nasopharyngeal Swab     Status: None   Collection Time: 01/30/19  3:50 PM   Specimen:  Nasopharyngeal Swab  Result Value Ref Range Status   SARS Coronavirus 2 NEGATIVE NEGATIVE Final    Comment: (NOTE) SARS-CoV-2 target nucleic acids are NOT DETECTED. The SARS-CoV-2 RNA is generally detectable in upper and lower respiratory specimens during the acute phase of infection. Negative results do not preclude SARS-CoV-2 infection, do not rule out co-infections with other pathogens, and should not be used as the sole basis for treatment or other patient management decisions. Negative results must be combined with clinical observations, patient history, and epidemiological information. The expected result is Negative. Fact Sheet for Patients: SugarRoll.be Fact Sheet for Healthcare Providers: https://www.woods-mathews.com/ This test is not yet approved or cleared by the [place] FDA and  has been authorized for detection and/or diagnosis of SARS-CoV-2 by FDA under an Emergency Use Authorization (EUA). This EUA will remain  in effect (meaning this test can be used) for the duration of the COVID-19 declaration under Section 56 4(b)(1) of the Act, 21 U.S.C. section 360bbb-3(b)(1), unless the authorization is terminated or revoked sooner. Performed at [facility], [place] [address] [place], [place] [address]          Radiology Studies: Dg Foot Complete Right  Result Date: 01/30/2019 CLINICAL DATA:  Foot infection EXAM: RIGHT FOOT COMPLETE - 3+ VIEW COMPARISON:  Most recent prior 01/27/2019 FINDINGS: Diffuse soft tissue swelling of the lower extremity focal soft tissue gas and ulceration along the medial aspect of the forefoot at the level of the head of the first metatarsal. New crescentic radiodensity is seen along the anterior aspect of the ulceration which could reflect a small foreign body or debris. No convincing osseous erosion, immature periostitis or subcortical lucency to suggest early features of osteomyelitis. Mild  degenerative changes in the fore- and midfoot are unchanged from prior study. Plantar calcaneal spurring is noted. Vascular calcium in the soft tissues. IMPRESSION: Diffuse soft tissue swelling of the lower extremity with ulceration along the medial aspect of the forefoot at the level of the head of the first metatarsal. No radiographic features of osteomyelitis. If there is concern clinically, MRI could be obtained. New crescentic radiodensity along the  anterior aspect of the ulceration which could reflect a small foreign body or debris. Electronically Signed   By: Lovena Le M.D.   On: 01/30/2019 16:09   Vas [place] Burnard Bunting With/wo Tbi  Result Date: 01/31/2019 LOWER EXTREMITY DOPPLER STUDY Indications: Ulceration, and gangrene. High Risk Factors: Hypertension, hyperlipidemia, Diabetes.  Comparison Study: No prior studies. Performing Technologist: Carlos Levering Rvt  Examination Guidelines: A complete evaluation includes at minimum, Doppler waveform signals and systolic blood pressure reading at the level of bilateral brachial, anterior tibial, and posterior tibial arteries, when vessel segments are accessible. Bilateral testing is considered an integral part of a complete examination. Photoelectric Plethysmograph (PPG) waveforms and toe systolic pressure readings are included as required and additional duplex testing as needed. Limited examinations for reoccurring indications may be performed as noted.  ABI Findings: +---------+------------------+-----+----------+--------+ Right    Rt Pressure (mmHg)IndexWaveform  Comment  +---------+------------------+-----+----------+--------+ Brachial 127                    triphasic          +---------+------------------+-----+----------+--------+ PTA      111               0.80 monophasic         +---------+------------------+-----+----------+--------+ DP       95                0.68 monophasic          +---------+------------------+-----+----------+--------+ Great Toe55                0.40                    +---------+------------------+-----+----------+--------+ +---------+------------------+-----+----------+-------+ Left     Lt Pressure (mmHg)IndexWaveform  Comment +---------+------------------+-----+----------+-------+ Brachial 139                    triphasic         +---------+------------------+-----+----------+-------+ PTA      114               0.82 monophasic        +---------+------------------+-----+----------+-------+ DP       89                0.64 monophasic        +---------+------------------+-----+----------+-------+ Great Toe83                0.60                   +---------+------------------+-----+----------+-------+ +-------+-----------+-----------+------------+------------+ ABI/TBIToday's ABIToday's TBIPrevious ABIPrevious TBI +-------+-----------+-----------+------------+------------+ Right  0.8        0.4                                 +-------+-----------+-----------+------------+------------+ Left   0.82       0.6                                 +-------+-----------+-----------+------------+------------+  Summary: Right: Resting right ankle-brachial index indicates mild right lower extremity arterial disease. The right toe-brachial index is abnormal. Left: Resting left ankle-brachial index indicates mild left lower extremity arterial disease. The left toe-brachial index is abnormal.  *See table(s) above for measurements and observations.     Preliminary         Scheduled Meds: . amLODipine  10 mg Oral Daily  . aspirin  EC  81 mg Oral Daily  . carvedilol  3.125 mg Oral BID WC  . enoxaparin (LOVENOX) injection  40 mg Subcutaneous Q24H  . ferrous sulfate  325 mg Oral Q breakfast  . furosemide  60 mg Oral Daily  . omega-3 acid ethyl esters  1 capsule Oral Daily  . rosuvastatin  10 mg Oral Daily  . sacubitril-valsartan  1  tablet Oral BID  . sodium chloride flush  3 mL Intravenous Once   Continuous Infusions: . vancomycin       LOS: 1 day    Time spent: 28min    Domenic Polite, MD [facility]  01/31/2019, 12:50 PM

## 2019-01-31 NOTE — Evaluation (Addendum)
Physical Therapy Evaluation Patient Details Name: Robert Horn MRN: [id number] DOB: 1934-01-06 Today's Date: 01/31/2019   History of Present Illness  Robert Horn is a 85 y.o. male with medical history significant of hypertension, hyperlipidemia, combined systolic and diastolic congestive heart failure with ejection fraction of 40 to 45%, severe aortic stenosis s/p TAVR, colon mass s/p laparoscopic partial colectomy on 12/15/2017 presents to emergency department due to worsening right foot pain, ulcer, swelling and redness since 10 days.  Patient reports that he stepped on something in his yard and he twisted his right foot inside of shoes.  Since then he has swelling, redness and severe pain in his right foot.  Reports that he came to the emergency department on 01/27/2019 and was prescribed Keflex however his symptoms was not improving so he decided to come to the emergency department for further evaluation and management.  Clinical Impression  Pt admitted with above diagnosis. Pt was able to ambulate with min to min guard assist with RW with pt weight bearing on right heel for comfort.  Should progress well unless he has further complications with right foot.  Pt currently with functional limitations due to the deficits listed below (see PT Problem List). Pt will benefit from skilled PT to increase their independence and safety with mobility to allow discharge to the venue listed below.      Follow Up Recommendations Home health PT;Supervision/Assistance - 24 hour(SNF if not 24 hour care)    Equipment Recommendations  Rolling walker with 5" wheels;3in1 (PT)(possibly will need wheelchair)    Recommendations for Other Services       Precautions / Restrictions Precautions Precautions: Fall Restrictions Weight Bearing Restrictions: No      Mobility  Bed Mobility Overal bed mobility: Independent             General bed mobility comments: cues only  Transfers Overall transfer  level: Needs assistance Equipment used: Rolling walker (2 wheeled) Transfers: Sit to/from Stand Sit to Stand: Min guard         General transfer comment: Cues for hand placement  Ambulation/Gait Ambulation/Gait assistance: Min assist Gait Distance (Feet): 100 Feet Assistive device: Rolling walker (2 wheeled) Gait Pattern/deviations: Step-to pattern;Decreased stride length;Decreased step length - right;Decreased stance time - right;Decreased weight shift to right   Gait velocity interpretation: <1.31 ft/sec, indicative of household ambulator General Gait Details: Pt weight bearing on right heel for confort.  OVerall good safety except with turns somewhat impulsive and needed more cues and steadying assist.   Stairs            Wheelchair Mobility    Modified Rankin (Stroke Patients Only)       Balance Overall balance assessment: Needs assistance Sitting-balance support: No upper extremity supported;Feet supported Sitting balance-Leahy Scale: Fair     Standing balance support: Bilateral upper extremity supported;During functional activity Standing balance-Leahy Scale: Poor Standing balance comment: Pt was able to stand wtih RW with UE support for balance                             Pertinent Vitals/Pain Pain Assessment: 0-10 Pain Score: 3  Pain Location: right foot Pain Descriptors / Indicators: Aching;Grimacing;Guarding Pain Intervention(s): Limited activity within patient's tolerance;Monitored during session;Repositioned    Home Living Family/patient expects to be discharged to:: Private residence Living Arrangements: Spouse/significant other Available Help at Discharge: Family;Available PRN/intermittently(sometimes they work) Type of Home: House Home Access: Stairs to enter Entrance Stairs-Rails:  None Entrance Stairs-Number of Steps: 3 Home Layout: Multi-level;Bed/bath upstairs;1/2 bath on main level Home Equipment: Cane - single  point;Crutches      Prior Function Level of Independence: Independent               Hand Dominance   Dominant Hand: Right    Extremity/Trunk Assessment   Upper Extremity Assessment Upper Extremity Assessment: Defer to OT evaluation    Lower Extremity Assessment Lower Extremity Assessment: Generalized weakness    Cervical / Trunk Assessment Cervical / Trunk Assessment: Normal  Communication   Communication: No difficulties  Cognition Arousal/Alertness: Awake/alert Behavior During Therapy: WFL for tasks assessed/performed Overall Cognitive Status: Within Functional Limits for tasks assessed                                        General Comments General comments (skin integrity, edema, etc.): 52 bpm    Exercises General Exercises - Lower Extremity Ankle Circles/Pumps: AROM;Both;10 reps;Seated Quad Sets: AROM;Both;10 reps;Supine Long Arc Quad: AROM;Both;10 reps;Supine Heel Slides: AROM;Both;10 reps;Supine   Assessment/Plan    PT Assessment Patient needs continued PT services  PT Problem List Decreased activity tolerance;Decreased balance;Decreased mobility;Decreased knowledge of use of DME;Decreased safety awareness;Decreased knowledge of precautions;Cardiopulmonary status limiting activity;Decreased strength;Decreased range of motion;Pain       PT Treatment Interventions DME instruction;Gait training;Functional mobility training;Therapeutic activities;Therapeutic exercise;Balance training;Patient/family education;Stair training    PT Goals (Current goals can be found in the Care Plan section)  Acute Rehab PT Goals Patient Stated Goal: to go home PT Goal Formulation: With patient Time For Goal Achievement: 02/14/19 Potential to Achieve Goals: Good    Frequency Min 3X/week   Barriers to discharge        Co-evaluation               AM-PAC PT "6 Clicks" Mobility  Outcome Measure Help needed turning from your back to your side  while in a flat bed without using bedrails?: None Help needed moving from lying on your back to sitting on the side of a flat bed without using bedrails?: None Help needed moving to and from a bed to a chair (including a wheelchair)?: A Little Help needed standing up from a chair using your arms (e.g., wheelchair or bedside chair)?: A Little Help needed to walk in hospital room?: A Little Help needed climbing 3-5 steps with a railing? : A Lot 6 Click Score: 19    End of Session Equipment Utilized During Treatment: Gait belt Activity Tolerance: Patient tolerated treatment well Patient left: in chair;with call bell/phone within reach;with chair alarm set Nurse Communication: Mobility status PT Visit Diagnosis: Muscle weakness (generalized) (M62.81);Pain Pain - Right/Left: Right Pain - part of body: Ankle and joints of foot    Time: [id number] PT Time Calculation (min) (ACUTE ONLY): 29 min   Charges:   PT Evaluation $PT Eval Moderate Complexity: 1 Mod PT Treatments $Gait Training: 8-22 mins        Carley Strickling W,PT Acute Rehabilitation Services Pager:  [phone]  Office:  [facility] 01/31/2019, 10:58 AM

## 2019-01-31 NOTE — Consult Note (Signed)
ORTHOPAEDIC CONSULTATION  REQUESTING PHYSICIAN: Pahwani, Michell Heinrich, MD  Chief Complaint: Ulceration right great toe MTP joint.  HPI: Robert Horn is a 85 y.o. male who presents with necrotic ulcer cellulitis abscess and osteomyelitis right great toe MTP joint.  Patient has undiagnosed type 2 diabetes.  Patient states that he rolled his ankle and afterwards played a round of golf.  Patient states he started developing pain the great toe MTP joint.  He went to the emergency room he states he was there for 6 hours was given an antibiotic and was discharged to home.  Patient returns 2 days later with large necrotic ulceration of the right great toe MTP joint.  Patient states that he had a initial appointment with podiatry in several days for his toe.  Patient is not a smoker he has had no history of diabetes.  Past Medical History:  Diagnosis Date  . CHF (congestive heart failure) ([place])   . Dyslipidemia 10/27/2015  . Dyspnea    w/ exertion   . Elevated PSA 10/27/2015  . Erectile dysfunction 10/27/2015  . Heart murmur   . Hypertension 10/27/2015  . Hypogonadism male 10/27/2015  . Obesity 10/27/2015  . Pneumonia 10/27/2015   pt states was 1982  . Rotator cuff tear 10/27/2015   Past Surgical History:  Procedure Laterality Date  . CARDIAC CATHETERIZATION N/A 11/14/2015   Procedure: Left Heart Cath and Coronary Angiography;  Surgeon: [name], MD;  Location: [facility];  Service: Cardiovascular;  Laterality: N/A;  . LAPAROSCOPIC PARTIAL COLECTOMY  12/15/2017   LAPAROSCOPIC PARTIAL COLECTOMY (N/A Abdomen)  . LAPAROSCOPIC PARTIAL COLECTOMY N/A 12/15/2017   Procedure: LAPAROSCOPIC PARTIAL COLECTOMY;  Surgeon: Erroll Luna, MD;  Location: [facility];  Service: General;  Laterality: N/A;  . TEE WITHOUT CARDIOVERSION N/A 12/15/2016   Procedure: TRANSESOPHAGEAL ECHOCARDIOGRAM (TEE);  Surgeon: Sherren Mocha, MD;  Location: [facility];  Service: Open Heart Surgery;  Laterality: N/A;  .  TRANSCATHETER AORTIC VALVE REPLACEMENT, TRANSFEMORAL N/A 12/15/2016   Procedure: TRANSCATHETER AORTIC VALVE REPLACEMENT, TRANSFEMORAL;  Surgeon: Sherren Mocha, MD;  Location: [facility];  Service: Open Heart Surgery;  Laterality: N/A;   Social History   Socioeconomic History  . Marital status: Single    Spouse name: Not on file  . Number of children: Not on file  . Years of education: Not on file  . Highest education level: Not on file  Occupational History  . Not on file  Social Needs  . Financial resource strain: Not on file  . Food insecurity    Worry: Not on file    Inability: Not on file  . Transportation needs    Medical: Not on file    Non-medical: Not on file  Tobacco Use  . Smoking status: Former Smoker    Types: Cigarettes  . Smokeless tobacco: Never Used  Substance and Sexual Activity  . Alcohol use: Yes    Comment: rarely  . Drug use: No  . Sexual activity: Not on file  Lifestyle  . Physical activity    Days per week: Not on file    Minutes per session: Not on file  . Stress: Not on file  Relationships  . Social Herbalist on phone: Not on file    Gets together: Not on file    Attends religious service: Not on file    Active member of club or organization: Not on file    Attends meetings of clubs or organizations: Not  on file    Relationship status: Not on file  Other Topics Concern  . Not on file  Social History Narrative  . Not on file   Family History  Problem Relation Age of Onset  . Diabetes Mother   . Heart disease Mother   . Pulmonary embolism Father    - negative except otherwise stated in the family history section No Known Allergies Prior to Admission medications   Medication Sig Start Date End Date Taking? Authorizing Provider  amLODipine (NORVASC) 10 MG tablet Take 1 tablet (10 mg total) by mouth daily. 03/09/18 03/04/19 Yes Daune Perch, NP  aspirin (ASPIRIN EC) 81 MG EC tablet Take 81 mg by mouth daily. Swallow whole.   Yes  [provider]  carvedilol (COREG) 3.125 MG tablet Take 3.125 mg by mouth 2 (two) times daily with a meal.   Yes [provider]  cephALEXin (KEFLEX) 500 MG capsule Take 1 capsule (500 mg total) by mouth 2 (two) times daily. [id number]  Yes Delora Fuel, MD  ferrous sulfate 325 (65 FE) MG tablet Take 325 mg by mouth daily with breakfast.   Yes [provider]  furosemide (LASIX) 40 MG tablet TAKE 1.5 TABLETS BY MOUTH DAILY. Patient taking differently: Take 60 mg by mouth daily.  01/17/19  Yes [name], MD  Multiple Vitamin (MULTIVITAMIN WITH MINERALS) TABS tablet Take 1 tablet by mouth daily. 10/27/15  Yes Dana Allan I, MD  omega-3 acid ethyl esters (LOVAZA) 1 g capsule TAKE ONE CAPSULE BY MOUTH EVERY DAY Patient taking differently: Take 1 g by mouth daily.  03/15/18  Yes [name], MD  rosuvastatin (CRESTOR) 10 MG tablet Take 10 mg by mouth daily.   Yes [provider]  sacubitril-valsartan (ENTRESTO) 49-51 MG Take 1 tablet by mouth 2 (two) times daily. 11/18/18 11/18/19 Yes [name], MD  VITAMIN B COMPLEX-C PO Take 1 tablet by mouth daily.   Yes [provider]   Dg Foot Complete Right  Result Date: 01/30/2019 CLINICAL DATA:  Foot infection EXAM: RIGHT FOOT COMPLETE - 3+ VIEW COMPARISON:  Most recent prior 01/27/2019 FINDINGS: Diffuse soft tissue swelling of the lower extremity focal soft tissue gas and ulceration along the medial aspect of the forefoot at the level of the head of the first metatarsal. New crescentic radiodensity is seen along the anterior aspect of the ulceration which could reflect a small foreign body or debris. No convincing osseous erosion, immature periostitis or subcortical lucency to suggest early features of osteomyelitis. Mild degenerative changes in the fore- and midfoot are unchanged from prior study. Plantar calcaneal spurring is noted. Vascular calcium in the soft tissues. IMPRESSION: Diffuse soft tissue  swelling of the lower extremity with ulceration along the medial aspect of the forefoot at the level of the head of the first metatarsal. No radiographic features of osteomyelitis. If there is concern clinically, MRI could be obtained. New crescentic radiodensity along the anterior aspect of the ulceration which could reflect a small foreign body or debris. Electronically Signed   By: Lovena Le M.D.   On: 01/30/2019 16:09   - pertinent xrays, CT, MRI studies were reviewed and independently interpreted  Positive ROS: All other systems have been reviewed and were otherwise negative with the exception of those mentioned in the HPI and as above.  Physical Exam: General: Alert, no acute distress Psychiatric: Patient is competent for consent with normal mood and affect Lymphatic: No axillary or cervical lymphadenopathy Cardiovascular: No pedal  edema Respiratory: No cyanosis, no use of accessory musculature GI: No organomegaly, abdomen is soft and non-tender    Images:  @ENCIMAGES @  Labs:  Lab Results  Component Value Date   HGBA1C 6.4 (H) 12/13/2017   HGBA1C 6.4 (H) 12/09/2016   HGBA1C 6.4 (H) 10/27/2015   REPTSTATUS PENDING 01/26/2019   CULT  01/26/2019    NO GROWTH 4 DAYS Performed at [facility], [place] [address], [place], [place] [address]     Lab Results  Component Value Date   ALBUMIN 2.3 (L) 01/31/2019   ALBUMIN 2.7 (L) 01/30/2019   ALBUMIN 3.0 (L) 01/26/2019    Neurologic: Patient does not have protective sensation bilateral lower extremities.   MUSCULOSKELETAL:   Skin: Examination patient has a large necrotic ulcer over the medial border right great toe MTP joint.  The necrotic ulcer extends down to bone consistent with osteomyelitis and gangrenous changes consistent with a circulatory problem.  Patient has a strong femoral pulse I cannot palpate a popliteal pulse or a dorsalis pedis pulse.  His foot is warm.  I ordered a hemoglobin A1c and his A1c is 7.0.   His albumin is 2.3 patient also has elevated chronic renal function and elevated liver function studies.  Assessment: Assessment: Undiagnosed type 2 diabetes with peripheral vascular disease with osteomyelitis and gangrenous abscess of the MTP joint right great toe.  Plan: Plan: I have ordered ankle-brachial indices, also recommending vascular vein surgery for consultation, they may need to get involved with the vascular status of the right lower extremity.  Patient also request a second opinion and I think vascular surgery would be an excellent source for a second opinion.  I discussed with the patient recommendations to proceed with removing the abscess necrotic tissue, with a first ray amputation, on Wednesday but could wait until Friday.  Patient states he understands he will discuss this with his wife who is coming up to the floor shortly and I gave him my cell phone number if he has any other questions.  Thank you for the consult and the opportunity to see Robert Horn, [place] [phone] 6:49 AM

## 2019-01-31 NOTE — Consult Note (Signed)
[facility]  Patient name: Robert Horn MRN: [id number] DOB: 1934/03/23 Sex: male   REQUESTING PROVIDER:   Dr. Sharol Given   REASON FOR CONSULT:    Right foot ulcer  HISTORY OF PRESENT ILLNESS:   Robert Horn is a 85 y.o. male, who I have been requested to evaluate for arterial insufficiency in the setting of a right foot ulcer.  The patient reports that he twisted his right foot inside of his shoe several days ago.  He initially went to the emergency department and was placed on antibiotics.  He has had a relatively rapid deterioration and appearance of his foot.  He was seen by Dr. Sharol Given earlier today and vascular evaluation was recommended.  The patient denies any history of diabetes however his hemoglobin A1c is elevated to greater than 7.  He is status post TAVR for severe aortic stenosis.  He is also undergone partial colectomy for malignancy in 2019.  He does suffer from hypertension which is medically managed.  He has congestive heart failure.  He is a former smoker.  He takes a statin for hypercholesterolemia.  PAST MEDICAL HISTORY    Past Medical History:  Diagnosis Date  . CHF (congestive heart failure) ([place])   . Dyslipidemia 10/27/2015  . Dyspnea    w/ exertion   . Elevated PSA 10/27/2015  . Erectile dysfunction 10/27/2015  . Heart murmur   . Hypertension 10/27/2015  . Hypogonadism male 10/27/2015  . Obesity 10/27/2015  . Pneumonia 10/27/2015   pt states was 1982  . Rotator cuff tear 10/27/2015     FAMILY HISTORY   Family History  Problem Relation Age of Onset  . Diabetes Mother   . Heart disease Mother   . Pulmonary embolism Father     SOCIAL HISTORY:   Social History   Socioeconomic History  . Marital status: Single    Spouse name: Not on file  . Number of children: Not on file  . Years of education: Not on file  . Highest education level: Not on file  Occupational History  . Not on  file  Social Needs  . Financial resource strain: Not on file  . Food insecurity    Worry: Not on file    Inability: Not on file  . Transportation needs    Medical: Not on file    Non-medical: Not on file  Tobacco Use  . Smoking status: Former Smoker    Types: Cigarettes  . Smokeless tobacco: Never Used  Substance and Sexual Activity  . Alcohol use: Yes    Comment: rarely  . Drug use: No  . Sexual activity: Not on file  Lifestyle  . Physical activity    Days per week: Not on file    Minutes per session: Not on file  . Stress: Not on file  Relationships  . Social Herbalist on phone: Not on file    Gets together: Not on file    Attends religious service: Not on file    Active member of club or organization: Not on file    Attends meetings of clubs or organizations: Not on file    Relationship status: Not on file  . Intimate partner violence    Fear of current or ex partner: Not on file    Emotionally abused: Not on file    Physically abused: Not on file    Forced sexual activity: Not on file  Other Topics Concern  .  Not on file  Social History Narrative  . Not on file    ALLERGIES:    No Known Allergies  CURRENT MEDICATIONS:    Current Facility-Administered Medications  Medication Dose Route Frequency Provider Last Rate Last Dose  . acetaminophen (TYLENOL) tablet 650 mg  650 mg Oral Q6H PRN Pahwani, Rinka R, MD       Or  . acetaminophen (TYLENOL) suppository 650 mg  650 mg Rectal Q6H PRN Pahwani, Rinka R, MD      . amLODipine (NORVASC) tablet 10 mg  10 mg Oral Daily Pahwani, Rinka R, MD   10 mg at 01/31/19 1032  . aspirin EC tablet 81 mg  81 mg Oral Daily Pahwani, Rinka R, MD   81 mg at 01/31/19 1032  . carvedilol (COREG) tablet 3.125 mg  3.125 mg Oral BID WC Pahwani, Rinka R, MD   3.125 mg at 01/31/19 1704  . enoxaparin (LOVENOX) injection 40 mg  40 mg Subcutaneous Q24H Pahwani, Rinka R, MD   40 mg at 01/31/19 1704  . ferrous sulfate tablet 325 mg   325 mg Oral Q breakfast Pahwani, Rinka R, MD   325 mg at 01/31/19 0837  . furosemide (LASIX) tablet 60 mg  60 mg Oral Daily Pahwani, Rinka R, MD   60 mg at 01/31/19 1031  . omega-3 acid ethyl esters (LOVAZA) capsule 1 g  1 capsule Oral Daily Pahwani, Rinka R, MD   1 g at 01/31/19 1032  . ondansetron (ZOFRAN) tablet 4 mg  4 mg Oral Q6H PRN Pahwani, Rinka R, MD       Or  . ondansetron (ZOFRAN) injection 4 mg  4 mg Intravenous Q6H PRN Pahwani, Rinka R, MD      . oxyCODONE (Oxy IR/ROXICODONE) immediate release tablet 10 mg  10 mg Oral Q4H PRN Pahwani, Rinka R, MD      . piperacillin-tazobactam (ZOSYN) IVPB 3.375 g  3.375 g Intravenous Q8H Bertis Ruddy, RPH 12.5 mL/hr at 01/31/19 1356 3.375 g at 01/31/19 1356  . rosuvastatin (CRESTOR) tablet 10 mg  10 mg Oral Daily Pahwani, Rinka R, MD   10 mg at 01/31/19 1031  . sodium chloride flush (NS) 0.9 % injection 3 mL  3 mL Intravenous Once Sherwood Gambler, MD      . vancomycin (VANCOCIN) 1,250 mg in sodium chloride 0.9 % 250 mL IVPB  1,250 mg Intravenous Q24H Henri Medal, RPH 166.7 mL/hr at 01/31/19 1704 1,250 mg at 01/31/19 1704    REVIEW OF SYSTEMS:   [X]  denotes positive finding, [ ]  denotes negative finding Cardiac  Comments:  Chest pain or chest pressure:    Shortness of breath upon exertion:    Short of breath when lying flat:    Irregular heart rhythm:        Vascular    Pain in calf, thigh, or hip brought on by ambulation:    Pain in feet at night that wakes you up from your sleep:     Blood clot in your veins:    Leg swelling:  x       Pulmonary    Oxygen at home:    Productive cough:     Wheezing:         Neurologic    Sudden weakness in arms or legs:     Sudden numbness in arms or legs:     Sudden onset of difficulty speaking or slurred speech:    Temporary loss of vision in one eye:  Problems with dizziness:         Gastrointestinal    Blood in stool:      Vomited blood:         Genitourinary    Burning when  urinating:     Blood in urine:        Psychiatric    Major depression:         Hematologic    Bleeding problems:    Problems with blood clotting too easily:        Skin    Rashes or ulcers: x       Constitutional    Fever or chills:     PHYSICAL EXAM:   Vitals:   01/30/19 2332 01/31/19 0220 01/31/19 0503 01/31/19 1448  BP:   (!) 131/92 (!) 155/86  Pulse:  64 62 (!) 56  Resp:   18 18  Temp:   98.1 F (36.7 C) 99.2 F (37.3 C)  TempSrc:   Oral Oral  SpO2:   97% 100%  Weight: 90.7 kg     Height: 6' (1.829 m)       GENERAL: The patient is a well-nourished male, in no acute distress. The vital signs are documented above. CARDIAC: There is a regular rate and rhythm.  VASCULAR: Palpable femoral pulses, pedal pulses are nonpalpable PULMONARY: Nonlabored respirations ABDOMEN: Soft and non-tender with normal pitched bowel sounds.  MUSCULOSKELETAL: There are no major deformities or cyanosis. NEUROLOGIC: No focal weakness or paresthesias are detected. SKIN: See photo below PSYCHIATRIC: The patient has a normal affect.    STUDIES:   I have ordered and reviewed his vascular lab studies as follows: ABI/TBIToday's ABIToday's TBIPrevious ABIPrevious TBI +-------+-----------+-----------+------------+------------+ Right  0.8        0.4                                 +-------+-----------+-----------+------------+------------+ Left   0.82       0.6                                 +-------+-----------+-----------+------------+------------+  Right toe pressure is 55  ASSESSMENT and PLAN   Infected gangrenous right foot ulcer: The patient is likely going to need extensive debridement and possible amputation.  I think this is complicated by his arterial insufficiency.  I discussed with the patient and his wife that angiography is indicated in order to define his anatomy and to improve blood flow as indicated.  We discussed the risks of angiography including distal  embolization and bleeding from the cannulation site.  They had many questions which I answered.  They understand that this will be done by one of my partners, Dr. Carlis Abbott tomorrow.  The patient would like to be seen by cardiology given his cardiac history.  Certainly if he require surgical revascularization he would need cardiology clearance.  Dr. Sharol Given will manage the wound on his foot.  He will be n.p.o. after midnight for angiography.   Leia Alf, MD, FACS [facility] [phone] Pager [phone]

## 2019-01-31 NOTE — Plan of Care (Signed)

## 2019-02-01 ENCOUNTER — Inpatient Hospital Stay (HOSPITAL_COMMUNITY): Payer: Medicare Other

## 2019-02-01 ENCOUNTER — Encounter (HOSPITAL_COMMUNITY)
Admission: EM | Disposition: A | Discharge status: Home Health | Payer: Self-pay | Source: Home / Self Care | Attending: Internal Medicine

## 2019-02-01 DIAGNOSIS — I350 Nonrheumatic aortic (valve) stenosis: Secondary | ICD-10-CM

## 2019-02-01 HISTORY — PX: LOWER EXTREMITY ANGIOGRAPHY: CATH118251

## 2019-02-01 HISTORY — PX: ABDOMINAL AORTOGRAM: CATH118222

## 2019-02-01 HISTORY — PX: PERIPHERAL VASCULAR INTERVENTION: CATH118257

## 2019-02-01 LAB — CBC
HCT: 32.9 % — ABNORMAL LOW (ref 39.0–52.0)
Hemoglobin: 10.6 g/dL — ABNORMAL LOW (ref 13.0–17.0)
MCH: 29 pg (ref 26.0–34.0)
MCHC: 32.2 g/dL (ref 30.0–36.0)
MCV: 89.9 fL (ref 80.0–100.0)
Platelets: 217 10*3/uL (ref 150–400)
RBC: 3.66 MIL/uL — ABNORMAL LOW (ref 4.22–5.81)
RDW: 13.2 % (ref 11.5–15.5)
WBC: 8.8 10*3/uL (ref 4.0–10.5)
nRBC: 0 % (ref 0.0–0.2)

## 2019-02-01 LAB — BASIC METABOLIC PANEL
Anion gap: 10 (ref 5–15)
BUN: 24 mg/dL — ABNORMAL HIGH (ref 8–23)
CO2: 22 mmol/L (ref 22–32)
Calcium: 8.8 mg/dL — ABNORMAL LOW (ref 8.9–10.3)
Chloride: 108 mmol/L (ref 98–111)
Creatinine, Ser: 1.4 mg/dL — ABNORMAL HIGH (ref 0.61–1.24)
GFR calc Af Amer: 53 mL/min — ABNORMAL LOW (ref >60)
GFR calc non Af Amer: 46 mL/min — ABNORMAL LOW (ref >60)
Glucose, Bld: 111 mg/dL — ABNORMAL HIGH (ref 70–99)
Potassium: 4.5 mmol/L (ref 3.5–5.1)
Sodium: 140 mmol/L (ref 135–145)

## 2019-02-01 LAB — ECHOCARDIOGRAM COMPLETE
Height: 72 in
Weight: 3199.32 oz

## 2019-02-01 LAB — PROTIME-INR
INR: 1.1 (ref 0.8–1.2)
Prothrombin Time: 14.2 seconds (ref 11.4–15.2)

## 2019-02-01 SURGERY — LOWER EXTREMITY ANGIOGRAPHY
Anesthesia: LOCAL | Laterality: Right

## 2019-02-01 MED ORDER — SODIUM CHLORIDE 0.9 % IV SOLN: 250.0000 mL | INTRAVENOUS | Status: DC | PRN

## 2019-02-01 MED ORDER — SODIUM CHLORIDE 0.9 % IV SOLN: INTRAVENOUS | Status: AC

## 2019-02-01 MED ORDER — MIDAZOLAM HCL 2 MG/2ML IJ SOLN
INTRAMUSCULAR | Status: AC
  Filled 2019-02-01: qty 2

## 2019-02-01 MED ORDER — CLOPIDOGREL BISULFATE 75 MG PO TABS
75.0000 mg | ORAL_TABLET | Freq: Every day | ORAL | Status: DC
  Administered 2019-02-02 – 2019-02-10 (×9): 75 mg via ORAL
  Filled 2019-02-01 (×9): qty 1

## 2019-02-01 MED ORDER — CLOPIDOGREL BISULFATE 75 MG PO TABS: 300.0000 mg | ORAL_TABLET | Freq: Once | ORAL | Status: AC

## 2019-02-01 MED ORDER — IODIXANOL 320 MG/ML IV SOLN
INTRAVENOUS | Status: DC | PRN
  Administered 2019-02-01: 100 mL

## 2019-02-01 MED ORDER — HEPARIN SODIUM (PORCINE) 1000 UNIT/ML IJ SOLN
INTRAMUSCULAR | Status: DC | PRN
  Administered 2019-02-01: 9500 [IU] via INTRAVENOUS

## 2019-02-01 MED ORDER — FENTANYL CITRATE (PF) 100 MCG/2ML IJ SOLN
INTRAMUSCULAR | Status: AC
  Filled 2019-02-01: qty 2

## 2019-02-01 MED ORDER — FENTANYL CITRATE (PF) 100 MCG/2ML IJ SOLN
INTRAMUSCULAR | Status: DC | PRN
  Administered 2019-02-01: 25 ug via INTRAVENOUS

## 2019-02-01 MED ORDER — HEPARIN (PORCINE) IN NACL 1000-0.9 UT/500ML-% IV SOLN
INTRAVENOUS | Status: DC | PRN
  Administered 2019-02-01 (×2): 500 mL

## 2019-02-01 MED ORDER — SODIUM CHLORIDE 0.9% FLUSH
3.0000 mL | Freq: Two times a day (BID) | INTRAVENOUS | Status: DC
  Administered 2019-02-02 – 2019-02-10 (×7): 3 mL via INTRAVENOUS

## 2019-02-01 MED ORDER — ONDANSETRON HCL 4 MG/2ML IJ SOLN: 4.0000 mg | Freq: Four times a day (QID) | INTRAMUSCULAR | Status: DC | PRN

## 2019-02-01 MED ORDER — HEPARIN SODIUM (PORCINE) 1000 UNIT/ML IJ SOLN
INTRAMUSCULAR | Status: AC
  Filled 2019-02-01: qty 1

## 2019-02-01 MED ORDER — LABETALOL HCL 5 MG/ML IV SOLN: 10.0000 mg | INTRAVENOUS | Status: DC | PRN

## 2019-02-01 MED ORDER — ACETAMINOPHEN 325 MG PO TABS
650.0000 mg | ORAL_TABLET | ORAL | Status: DC | PRN
  Administered 2019-02-04 – 2019-02-07 (×3): 650 mg via ORAL
  Filled 2019-02-01 (×3): qty 2

## 2019-02-01 MED ORDER — CLOPIDOGREL BISULFATE 300 MG PO TABS
ORAL_TABLET | ORAL | Status: DC | PRN
  Administered 2019-02-01: 300 mg via ORAL

## 2019-02-01 MED ORDER — CLOPIDOGREL BISULFATE 300 MG PO TABS
ORAL_TABLET | ORAL | Status: AC
  Filled 2019-02-01: qty 1

## 2019-02-01 MED ORDER — SODIUM CHLORIDE 0.9% FLUSH: 3.0000 mL | INTRAVENOUS | Status: DC | PRN

## 2019-02-01 MED ORDER — HEPARIN (PORCINE) IN NACL 1000-0.9 UT/500ML-% IV SOLN
INTRAVENOUS | Status: AC
  Filled 2019-02-01: qty 500

## 2019-02-01 MED ORDER — LIDOCAINE HCL (PF) 1 % IJ SOLN
INTRAMUSCULAR | Status: AC
  Filled 2019-02-01: qty 30

## 2019-02-01 MED ORDER — HYDRALAZINE HCL 20 MG/ML IJ SOLN: 5.0000 mg | INTRAMUSCULAR | Status: DC | PRN

## 2019-02-01 MED ORDER — LIDOCAINE HCL (PF) 1 % IJ SOLN
INTRAMUSCULAR | Status: DC | PRN
  Administered 2019-02-01: 18 mL

## 2019-02-01 MED ORDER — SODIUM CHLORIDE 0.9 % IV SOLN
INTRAVENOUS | Status: DC
  Administered 2019-02-01: 06:00:00 via INTRAVENOUS

## 2019-02-01 MED ORDER — MIDAZOLAM HCL 2 MG/2ML IJ SOLN
INTRAMUSCULAR | Status: DC | PRN
  Administered 2019-02-01: 1 mg via INTRAVENOUS

## 2019-02-01 SURGICAL SUPPLY — 22 items
BALLN MUSTANG 5X120X135 (BALLOONS) ×3
BALLOON MUSTANG 5X120X135 (BALLOONS) IMPLANT
CATH CROSS OVER TEMPO 5F (CATHETERS) ×1 IMPLANT
CATH OMNI FLUSH 5F 65CM (CATHETERS) ×1 IMPLANT
CATH QUICKCROSS .035X135CM (MICROCATHETER) ×1 IMPLANT
CLOSURE MYNX CONTROL 6F/7F (Vascular Products) ×1 IMPLANT
DEVICE TORQUE .025-.038 (MISCELLANEOUS) ×1 IMPLANT
GLIDEWIRE ADV .035X260CM (WIRE) ×1 IMPLANT
GUIDEWIRE ANGLED .035X150CM (WIRE) ×1 IMPLANT
KIT ENCORE 26 ADVANTAGE (KITS) ×1 IMPLANT
KIT MICROPUNCTURE NIT STIFF (SHEATH) ×1 IMPLANT
KIT PV (KITS) ×3 IMPLANT
SHEATH FLEX ANSEL ANG 6F 45CM (SHEATH) ×1 IMPLANT
SHEATH PINNACLE 5F 10CM (SHEATH) ×1 IMPLANT
SHEATH PINNACLE 6F 10CM (SHEATH) ×1 IMPLANT
SHEATH PROBE COVER 6X72 (BAG) ×1 IMPLANT
STENT ELUVIA 6X120X130 (Permanent Stent) ×1 IMPLANT
SYR MEDRAD MARK V 150ML (SYRINGE) ×1 IMPLANT
TRANSDUCER W/STOPCOCK (MISCELLANEOUS) ×3 IMPLANT
TRAY PV CATH (CUSTOM PROCEDURE TRAY) ×3 IMPLANT
WIRE BENTSON .035X145CM (WIRE) ×1 IMPLANT
WIRE G V18X300CM (WIRE) ×1 IMPLANT

## 2019-02-01 NOTE — Progress Notes (Signed)
[facility]  Subjective  - Wound to right foot.  Seen by Dr. Trula Slade last night.   Objective 130/67 (!) 48 98.2 F (36.8 C) (Oral) 18 100%  Intake/Output Summary (Last 24 hours) at 02/01/2019 1149 Last data filed at 02/01/2019 0800 Gross per 24 hour  Intake 240 ml  Output -  Net 240 ml    Palpable femoral pulses bilaterally      Laboratory Lab Results: Recent Labs    01/31/19 0324 02/01/19 0159  WBC 10.3 8.8  HGB 10.3* 10.6*  HCT 31.8* 32.9*  PLT 185 217   BMET Recent Labs    01/31/19 0324 02/01/19 0159  NA 138 140  K 4.4 4.5  CL 108 108  CO2 21* 22  GLUCOSE 120* 111*  BUN 27* 24*  CREATININE 1.39* 1.40*  CALCIUM 8.7* 8.8*    COAG Lab Results  Component Value Date   INR 1.1 02/01/2019   INR 1.2 01/26/2019   INR 0.95 03/06/2018   No results found for: PTT  Assessment/Planning:  Plan aortogram, lower extremity arteriogram.  Right leg critical limb ischemia with tissue loss as pictured above.  Risks and benefits discussed with patient.  Marty Heck 02/01/2019 11:49 AM --

## 2019-02-01 NOTE — Op Note (Signed)
Patient name: Markez Ulrich MRN: [id number] DOB: [birth year] Sex: male  02/01/2019 Pre-operative Diagnosis: Critical limb ischemia of the right lower extremity with tissue loss Post-operative diagnosis:  Same Surgeon:  Marty Heck, MD Procedure Performed: 1.  Ultrasound-guided access of the left common femoral artery 2.  Aortogram 3.  Right lower extremity arteriogram with selection of third order branches 4.  Right SFA angioplasty with stent placement (angioplasty 5 mm x 120 mm Mustang, stent with 6 mm x 120 mm drug-coated Eluvia) 5.  Mynx closure of the left common femoral artery 6.  58 minutes of monitored moderate conscious sedation time  Indications: Patient is an 85 year old male that was seen in consultation yesterday by Dr. Trula Slade for tissue loss in the right lower extremity.  He had no palpable pedal pulses and had depressed ABIs of 0.8.  He presents today for right lower extremity arteriogram possible intervention after risk and benefits were discussed.  Findings:   Aortogram showed single right renal artery and two left renal arteries with no flow-limiting stenosis.  There were no flow-limiting stenosis in the aortoiliac segment.  The right external iliac was tortuous.  Right lower extremity arteriogram which is the side of interest showed a patent common femoral, profunda, proximal SFA.  In the mid to distal SFA there was approximate 120 mm length of SFA with greater than 90 to 95% stenosis that was heavily diseaed.  Patient had distal reconstitution of above and below-knee popliteal artery that was widely patent.  Patient had two-vessel runoff via peroneal and posterior tibial artery.  Anterior tibial occluded shortly after takeoff.  Dominant flow to the foot was via the posterior tibial artery.  The right SFA lesion was ultimately crossed and predilated with a 5 mm x 120 mm Mustang.  Ultimately elected to stent the lesion and a 6 mm x 120 mm drug-eluting Eluvia.  There  is no residual stenosis in the stent that is widely patent.  Patient has preserved two-vessel runoff via the peroneal and posterior tibial arteries.  He has a palpable posterior tibial pulse.   Procedure:  The patient was identified in the holding area and taken to room 8.  The patient was then placed supine on the table and prepped and draped in the usual sterile fashion.  A time out was called.  Ultrasound was used to evaluate the left common femoral artery.  It was patent .  A digital ultrasound image was acquired.  A micropuncture needle was used to access the left common femoral artery under ultrasound guidance.  An 018 wire was advanced without resistance and a micropuncture sheath was placed.  The 018 wire was removed and a benson wire was placed.  The micropuncture sheath was exchanged for a 5 french sheath.  An omniflush catheter was advanced over the wire to the level of L-1.  An abdominal angiogram was obtained.  Next, using a cross over catheter and a glide wire, the aortic bifurcation was crossed and the catheter was placed into theright external iliac artery and right runoff was obtained.  Ultimately elected to intervene on the right SFA lesion as described above.  Used a guidewire advantage down the right SFA and exchanged for a long 6 [place] Ansell sheath in the left groin over the aortic bifurcation.  Patient was given 100 units/kg heparin IV.  Ultimately used a 035 quick cross with a Glidewire advantage and crossed the SFA lesion into the above-knee popliteal artery.  Did a brief injection in  the catheter once we crossed the lesion to confirm we were in the true lumen.  Ultimately predilated the lesion once it was marked with a hand-injection arteriogram with a 5 mm x 120 mm Mustang to nominal pressure for 1 minute.  Another injection through the sheath showed improvement in the stenotic lesion but there was dissection in the artery had a fair amount of residual disease evident.  Elected to  stent this.  Ultimately placed a 6 mm x 120 mm Eluvia stent across the lesion and was deployed.  A 5 mm x 120 mm Mustang was then exchanged and inflated within the stent to nominal pressure for 2 minutes.  Another injection showed inline flow through the SFA stent with no residual stenosis and preserved runoff via the peroneal and posterior tibial artery in the right foot.  The stent was widely patent.  That point in time exchanged for a short 6 French sheath in the left groin.  All the wires and catheters were removed a mynx closure device was deployed in left common femoral artery and pressure was held for 10 minutes.  Plan: Patient will need dual antiplatelet therapy with aspirin and plavix.  Plavix loaded in cath lab today.   Marty Heck, MD [facility] Office: [phone] Pager: [place]

## 2019-02-01 NOTE — Progress Notes (Signed)
  Echocardiogram 2D Echocardiogram has been performed.  Robert Horn 02/01/2019, 11:28 AM

## 2019-02-01 NOTE — Progress Notes (Signed)
Physical Therapy Treatment Patient Details Name: Robert Horn MRN: [id number] DOB: [birth year] Today's Date: 02/01/2019    History of Present Illness Robert Horn is a 85 y.o. male with medical history significant of hypertension, hyperlipidemia, combined systolic and diastolic congestive heart failure with ejection fraction of 40 to 45%, severe aortic stenosis s/p TAVR, colon mass s/p laparoscopic partial colectomy on 12/15/2017 presents to emergency department due to worsening right foot pain, ulcer, swelling and redness since 10 days.  Patient reports that he stepped on something in his yard and he twisted his right foot inside of shoes.  Since then he has swelling, redness and severe pain in his right foot.  Reports that he came to the emergency department on 01/27/2019 and was prescribed Keflex however his symptoms was not improving so he decided to come to the emergency department for further evaluation and management.    PT Comments    Pt progressing with functional mobility this session, able to tolerate increased gait training with less UE support. Pt ambulated x 150 ft with RW min guard assist, x 100 ft using IV pole min guard assist and x 50 ft without an AD min assist. During gait pt maintained weightbearing on R heel for comfort, pt requiring increased assist for balance during gait without an AD. Will continue to follow acutely and progress gait and balance as appropriate. Continue to recommend home with homehealth therapy in order to maximize functional independence with mobility.   Follow Up Recommendations  Home health PT;Supervision/Assistance - 24 hour     Equipment Recommendations  Rolling walker with 5" wheels;3in1 (PT)    Recommendations for Other Services       Precautions / Restrictions Precautions Precautions: Fall Restrictions Weight Bearing Restrictions: No    Mobility  Bed Mobility Overal bed mobility: Independent                Transfers Overall  transfer level: Needs assistance Equipment used: Rolling walker (2 wheeled) Transfers: Sit to/from Stand Sit to Stand: Min guard         General transfer comment: Cues for hand placement  Ambulation/Gait Ambulation/Gait assistance: Min assist;Min guard Gait Distance (Feet): 150 Feet(+ 100 ft + 50 ft) Assistive device: Rolling walker (2 wheeled) Gait Pattern/deviations: Step-to pattern;Decreased stride length;Decreased step length - right;Decreased stance time - right;Decreased weight shift to right   Gait velocity interpretation: <1.31 ft/sec, indicative of household ambulator General Gait Details: Pt weight bearing on right heel for confort during gait.  Pt required min guard when ambulating with RW, also ambulated x 100 ft with IV pole and then x 50 ft without an AD,  needed more cues and steadying assist for gait without AD   Stairs             Wheelchair Mobility    Modified Rankin (Stroke Patients Only)       Balance Overall balance assessment: Needs assistance Sitting-balance support: No upper extremity supported;Feet supported Sitting balance-Leahy Scale: Fair     Standing balance support: Bilateral upper extremity supported;During functional activity Standing balance-Leahy Scale: Fair Standing balance comment: able to progress pt to short distance gait without AD/without UE support, min assist to stedy                            Cognition Arousal/Alertness: Awake/alert Behavior During Therapy: WFL for tasks assessed/performed Overall Cognitive Status: Within Functional Limits for tasks assessed  Exercises      General Comments        Pertinent Vitals/Pain Pain Assessment: Faces Faces Pain Scale: Hurts a little bit Pain Location: right foot Pain Descriptors / Indicators: Grimacing;Guarding Pain Intervention(s): Limited activity within patient's tolerance;Monitored during session     Home Living                      Prior Function            PT Goals (current goals can now be found in the care plan section) Progress towards PT goals: Progressing toward goals    Frequency    Min 3X/week      PT Plan Current plan remains appropriate    Co-evaluation              AM-PAC PT "6 Clicks" Mobility   Outcome Measure  Help needed turning from your back to your side while in a flat bed without using bedrails?: None Help needed moving from lying on your back to sitting on the side of a flat bed without using bedrails?: None Help needed moving to and from a bed to a chair (including a wheelchair)?: A Little Help needed standing up from a chair using your arms (e.g., wheelchair or bedside chair)?: A Little Help needed to walk in hospital room?: A Little Help needed climbing 3-5 steps with a railing? : A Little 6 Click Score: 20    End of Session Equipment Utilized During Treatment: Gait belt Activity Tolerance: Patient tolerated treatment well Patient left: in chair;with call bell/phone within reach;with chair alarm set Nurse Communication: Mobility status PT Visit Diagnosis: Muscle weakness (generalized) (M62.81);Pain Pain - Right/Left: Right Pain - part of body: Ankle and joints of foot     Time: [id number] PT Time Calculation (min) (ACUTE ONLY): 14 min  Charges:  $Gait Training: 8-22 mins                     Netta Corrigan, PT, DPT, CSRS Acute Rehab Office [place] 02/01/2019, 9:40 AM

## 2019-02-01 NOTE — Progress Notes (Signed)
Inpatient Diabetes Program Recommendations  AACE/ADA: New Consensus Statement on Inpatient Glycemic Control (2015)  Target Ranges:  Prepandial:   less than 140 mg/dL      Peak postprandial:   less than 180 mg/dL (1-2 hours)      Critically ill patients:  140 - 180 mg/dL   Lab Results  Component Value Date   GLUCAP 125 (H) 10/27/2015   HGBA1C 7.0 (H) 01/31/2019    Review of Glycemic Control Results for EAIN, LANSER (MRN [id number]) as of 02/01/2019 14:14  Ref. Range 02/01/2019 01:59  Glucose Latest Ref Range: 70 - 99 mg/dL 111 (H)   Diabetes history: new onset DM Outpatient Diabetes medications: none Current orders for Inpatient glycemic control: none  Inpatient Diabetes Program Recommendations:    Consider adding CBGs TID & HS.  Noted consult for new onset diabetes. Attempted to see x 2, pt in cath lab. Will reattempt on 11/5.   Thanks, Bronson Curb, MSN, RNC-OB Diabetes Coordinator [phone] (8a-5p)

## 2019-02-01 NOTE — Progress Notes (Signed)
PROGRESS NOTE    Robert Horn  [id number] DOB: 1933/06/24 DOA: 01/30/2019 PCP: [name], MD   Brief Narrative:  85 y.o.malewith medical history significant ofhypertension, hyperlipidemia, combined systolic and diastolic congestive heart failure with ejection fraction of 40 to 45%, severe aortic stenosis s/p TAVR, colon mass s/p laparoscopic partial colectomy on 12/15/2017 presents to emergency department due to worsening right foot pain, ulcer, swelling and redness since 10 days.  Failed outpatient Keflex treatment.  Found to have right foot necrotic wound for which orthopedic and vascular surgery were consulted.   Assessment & Plan:   Principal Problem:   Cellulitis of right foot Active Problems:   Essential hypertension   Hyperlipidemia   Chronic combined systolic and diastolic heart failure (HCC)   Severe aortic stenosis   S/P TAVR (transcatheter aortic valve replacement)   CKD (chronic kidney disease), stage III   Elevated LFTs   Colon cancer (HCC)   Anemia of chronic disease   Osteomyelitis of great toe of right foot (HCC)   Severe protein-calorie malnutrition (HCC)   Diabetic polyneuropathy associated with type 2 diabetes mellitus (HCC)   Cutaneous abscess of right foot  Right foot necrotic wound with surrounding cellulitis Peripheral vascular disease -Vascular and orthopedic, Dr. Sharol Given consulted. -Continue IV vancomycin and Zosyn. -ABI showing mild right lower and left lower extremity disease -Plans for arteriogram lower extremity today. -Eventually will require debridement by orthopedic. -On aspirin and statin -Normal saline at 100 cc/h, hydration with caution.  New diagnosis of diabetes mellitus type 2 -Hemoglobin A1c 7.0.  Insulin sliding scale and Accu-Chek.  Chronic congestive heart failure, reduced ejection fraction 40% -Appears clinically euvolemic.  Continue home medications including Lasix, Entresto and Coreg  CKD stage IIIa -Stable.   Essential hypertension -Norvasc 10 mg daily, Coreg 3.125 mg twice daily  Anemia of chronic disease and iron deficiency -Iron sulfate, bowel regimen  History of severe aortic stenosis status post TAVR History of adenocarcinoma of the colon status post partial colectomy thousand 19  DVT prophylaxis: Lovenox Code Status: Full code Family Communication: Attempted to call his wife but she did not answer therefore left her voicemail. Disposition Plan: Maintain hospital stay for further evaluation by vascular surgery and orthopedic.  Currently ongoing evaluation.  Consultants:   Vascular  Orthopedic  Procedures:     Antimicrobials:   Vancomycin day 2  Zosyn day 10   Subjective: Feels okay, no complaints.  Anxious about his arteriogram.  Review of Systems Otherwise negative except as per HPI, including: General: Denies fever, chills, night sweats or unintended weight loss. Resp: Denies cough, wheezing, shortness of breath. Cardiac: Denies chest pain, palpitations, orthopnea, paroxysmal nocturnal dyspnea. GI: Denies abdominal pain, nausea, vomiting, diarrhea or constipation GU: Denies dysuria, frequency, hesitancy or incontinence MS: Denies muscle aches, joint pain or swelling Neuro: Denies headache, neurologic deficits (focal weakness, numbness, tingling), abnormal gait Psych: Denies anxiety, depression, SI/HI/AVH Skin: Denies new rashes or lesions ID: Denies sick contacts, exotic exposures, travel  Objective: Vitals:   01/31/19 0220 01/31/19 0503 01/31/19 1448 01/31/19 2052  BP:  (!) 131/92 (!) 155/86 130/67  Pulse: 64 62 (!) 56 (!) 48  Resp:  18 18 18   Temp:  98.1 F (36.7 C) 99.2 F (37.3 C) 98.2 F (36.8 C)  TempSrc:  Oral Oral Oral  SpO2:  97% 100% 100%  Weight:      Height:        Intake/Output Summary (Last 24 hours) at 02/01/2019 1125 Last data filed at 02/01/2019 0800  Gross per 24 hour  Intake 240 ml  Output -  Net 240 ml   Filed Weights    01/30/19 1431 01/30/19 2332  Weight: 90.7 kg 90.7 kg    Examination:  General exam: Appears calm and comfortable  Respiratory system: Clear to auscultation. Respiratory effort normal. Cardiovascular system: S1 & S2 heard, RRR. No JVD, murmurs, rubs, gallops or clicks. No pedal edema. Gastrointestinal system: Abdomen is nondistended, soft and nontender. No organomegaly or masses felt. Normal bowel sounds heard. Central nervous system: Alert and oriented. No focal neurological deficits. Extremities: Symmetric 5 x 5 power. Skin: Right lower extremity dressing in place. Psychiatry: Judgement and insight appear normal. Mood & affect appropriate.     Data Reviewed:   CBC: Recent Labs  Lab 01/26/19 1951 01/30/19 1433 01/30/19 2356 01/31/19 0324 02/01/19 0159  WBC 16.0* 11.5* 10.6* 10.3 8.8  NEUTROABS 13.1* 9.0*  --   --   --   HGB 11.0* 11.4* 10.3* 10.3* 10.6*  HCT 33.8* 36.7* 32.2* 31.8* 32.9*  MCV 91.4 93.6 91.0 91.1 89.9  PLT 225 238 PLATELET CLUMPS NOTED ON SMEAR, UNABLE TO ESTIMATE 185 [id number]   Basic Metabolic Panel: Recent Labs  Lab 01/26/19 1951 01/30/19 1433 01/30/19 2356 01/31/19 0324 02/01/19 0159  NA 135 135  --  138 140  K 4.3 4.3  --  4.4 4.5  CL 102 101  --  108 108  CO2 21* 22  --  21* 22  GLUCOSE 156* 169*  --  120* 111*  BUN 26* 27*  --  27* 24*  CREATININE 1.69* 1.58* 1.52* 1.39* 1.40*  CALCIUM 8.7* 9.0  --  8.7* 8.8*   GFR: Estimated Creatinine Clearance: 43.9 mL/min (A) (by C-G formula based on SCr of 1.4 mg/dL (H)). Liver Function Tests: Recent Labs  Lab 01/26/19 1951 01/30/19 1433 01/31/19 0324  AST 39 87* 69*  ALT 41 71* 65*  ALKPHOS 151* 201* 170*  BILITOT 1.1 0.9 0.6  PROT 7.5 8.0 6.5  ALBUMIN 3.0* 2.7* 2.3*   No results for input(s): LIPASE, AMYLASE in the last 168 hours. No results for input(s): AMMONIA in the last 168 hours. Coagulation Profile: Recent Labs  Lab 01/26/19 1951 02/01/19 0159  INR 1.2 1.1   Cardiac Enzymes: No  results for input(s): CKTOTAL, CKMB, CKMBINDEX, TROPONINI in the last 168 hours. BNP (last 3 results) Recent Labs    11/24/18 0849  PROBNP 294   HbA1C: Recent Labs    01/31/19 0324  HGBA1C 7.0*   CBG: No results for input(s): GLUCAP in the last 168 hours. Lipid Profile: Recent Labs    01/30/19 2350  CHOL 110  HDL 22*  LDLCALC 71  TRIG 87  CHOLHDL 5.0   Thyroid Function Tests: No results for input(s): TSH, T4TOTAL, FREET4, T3FREE, THYROIDAB in the last 72 hours. Anemia Panel: No results for input(s): VITAMINB12, FOLATE, FERRITIN, TIBC, IRON, RETICCTPCT in the last 72 hours. Sepsis Labs: Recent Labs  Lab 01/26/19 1951 01/30/19 1433 01/30/19 1602  LATICACIDVEN 1.1 1.3 1.5    Recent Results (from the past 240 hour(s))  Culture, blood (Routine x 2)     Status: None   Collection Time: 01/26/19  7:30 PM   Specimen: BLOOD RIGHT ARM  Result Value Ref Range Status   Specimen Description BLOOD RIGHT ARM  Final   Special Requests   Final    BOTTLES DRAWN AEROBIC AND ANAEROBIC Blood Culture adequate volume   Culture   Final    NO  GROWTH 5 DAYS Performed at [facility], [place] [address] [place], [place] [address]    Report Status 01/31/2019 FINAL  Final  Culture, blood (Routine x 2)     Status: None   Collection Time: 01/26/19  7:40 PM   Specimen: BLOOD RIGHT HAND  Result Value Ref Range Status   Specimen Description BLOOD RIGHT HAND  Final   Special Requests   Final    BOTTLES DRAWN AEROBIC ONLY Blood Culture adequate volume   Culture   Final    NO GROWTH 5 DAYS Performed at [facility], [place] [address] [place], [place] [address]    Report Status 01/31/2019 FINAL  Final  Blood culture (routine x 2)     Status: None (Preliminary result)   Collection Time: 01/30/19  2:49 PM   Specimen: BLOOD  Result Value Ref Range Status   Specimen Description BLOOD LEFT ANTECUBITAL  Final   Special Requests   Final    BOTTLES DRAWN AEROBIC AND ANAEROBIC Blood  Culture results may not be optimal due to an inadequate volume of blood received in culture bottles   Culture   Final    NO GROWTH < 24 HOURS Performed at [facility], [place] [address] [place], [place] [address]    Report Status PENDING  Incomplete  Blood culture (routine x 2)     Status: None (Preliminary result)   Collection Time: 01/30/19  3:37 PM   Specimen: BLOOD  Result Value Ref Range Status   Specimen Description BLOOD RIGHT ANTECUBITAL  Final   Special Requests   Final    BOTTLES DRAWN AEROBIC AND ANAEROBIC Blood Culture adequate volume   Culture   Final    NO GROWTH < 24 HOURS Performed at [facility], [place] [address], [place], [place] [address]    Report Status PENDING  Incomplete  SARS CORONAVIRUS 2 (TAT 6-24 HRS) Nasopharyngeal Nasopharyngeal Swab     Status: None   Collection Time: 01/30/19  3:50 PM   Specimen: Nasopharyngeal Swab  Result Value Ref Range Status   SARS Coronavirus 2 NEGATIVE NEGATIVE Final    Comment: (NOTE) SARS-CoV-2 target nucleic acids are NOT DETECTED. The SARS-CoV-2 RNA is generally detectable in upper and lower respiratory specimens during the acute phase of infection. Negative results do not preclude SARS-CoV-2 infection, do not rule out co-infections with other pathogens, and should not be used as the sole basis for treatment or other patient management decisions. Negative results must be combined with clinical observations, patient history, and epidemiological information. The expected result is Negative. Fact Sheet for Patients: SugarRoll.be Fact Sheet for Healthcare Providers: https://www.woods-mathews.com/ This test is not yet approved or cleared by the [place] FDA and  has been authorized for detection and/or diagnosis of SARS-CoV-2 by FDA under an Emergency Use Authorization (EUA). This EUA will remain  in effect (meaning this test can be used) for the duration of the  COVID-19 declaration under Section 56 4(b)(1) of the Act, 21 U.S.C. section 360bbb-3(b)(1), unless the authorization is terminated or revoked sooner. Performed at [facility], [place] [address] [place], [place] [address]          Radiology Studies: Dg Foot Complete Right  Result Date: 01/30/2019 CLINICAL DATA:  Foot infection EXAM: RIGHT FOOT COMPLETE - 3+ VIEW COMPARISON:  Most recent prior 01/27/2019 FINDINGS: Diffuse soft tissue swelling of the lower extremity focal soft tissue gas and ulceration along the medial aspect of the  forefoot at the level of the head of the first metatarsal. New crescentic radiodensity is seen along the anterior aspect of the ulceration which could reflect a small foreign body or debris. No convincing osseous erosion, immature periostitis or subcortical lucency to suggest early features of osteomyelitis. Mild degenerative changes in the fore- and midfoot are unchanged from prior study. Plantar calcaneal spurring is noted. Vascular calcium in the soft tissues. IMPRESSION: Diffuse soft tissue swelling of the lower extremity with ulceration along the medial aspect of the forefoot at the level of the head of the first metatarsal. No radiographic features of osteomyelitis. If there is concern clinically, MRI could be obtained. New crescentic radiodensity along the anterior aspect of the ulceration which could reflect a small foreign body or debris. Electronically Signed   By: Lovena Le M.D.   On: 01/30/2019 16:09   Vas [place] Burnard Bunting With/wo Tbi  Result Date: 01/31/2019 LOWER EXTREMITY DOPPLER STUDY Indications: Ulceration, and gangrene. High Risk Factors: Hypertension, hyperlipidemia, Diabetes.  Comparison Study: No prior studies. Performing Technologist: Carlos Levering Rvt  Examination Guidelines: A complete evaluation includes at minimum, Doppler waveform signals and systolic blood pressure reading at the level of bilateral brachial, anterior tibial, and posterior  tibial arteries, when vessel segments are accessible. Bilateral testing is considered an integral part of a complete examination. Photoelectric Plethysmograph (PPG) waveforms and toe systolic pressure readings are included as required and additional duplex testing as needed. Limited examinations for reoccurring indications may be performed as noted.  ABI Findings: +---------+------------------+-----+----------+--------+ Right    Rt Pressure (mmHg)IndexWaveform  Comment  +---------+------------------+-----+----------+--------+ Brachial 127                    triphasic          +---------+------------------+-----+----------+--------+ PTA      111               0.80 monophasic         +---------+------------------+-----+----------+--------+ DP       95                0.68 monophasic         +---------+------------------+-----+----------+--------+ Great Toe55                0.40                    +---------+------------------+-----+----------+--------+ +---------+------------------+-----+----------+-------+ Left     Lt Pressure (mmHg)IndexWaveform  Comment +---------+------------------+-----+----------+-------+ Brachial 139                    triphasic         +---------+------------------+-----+----------+-------+ PTA      114               0.82 monophasic        +---------+------------------+-----+----------+-------+ DP       89                0.64 monophasic        +---------+------------------+-----+----------+-------+ Great Toe83                0.60                   +---------+------------------+-----+----------+-------+ +-------+-----------+-----------+------------+------------+ ABI/TBIToday's ABIToday's TBIPrevious ABIPrevious TBI +-------+-----------+-----------+------------+------------+ Right  0.8        0.4                                 +-------+-----------+-----------+------------+------------+  Left   0.82       0.6                                  +-------+-----------+-----------+------------+------------+  Summary: Right: Resting right ankle-brachial index indicates mild right lower extremity arterial disease. The right toe-brachial index is abnormal. Left: Resting left ankle-brachial index indicates mild left lower extremity arterial disease. The left toe-brachial index is abnormal.  *See table(s) above for measurements and observations.  Electronically signed by Harold Barban MD on 01/31/2019 at 5:40:47 PM.    Final         Scheduled Meds: . amLODipine  10 mg Oral Daily  . aspirin EC  81 mg Oral Daily  . carvedilol  3.125 mg Oral BID WC  . enoxaparin (LOVENOX) injection  40 mg Subcutaneous Q24H  . ferrous sulfate  325 mg Oral Q breakfast  . furosemide  60 mg Oral Daily  . omega-3 acid ethyl esters  1 capsule Oral Daily  . rosuvastatin  10 mg Oral Daily  . sodium chloride flush  3 mL Intravenous Once   Continuous Infusions: . sodium chloride 100 mL/hr at 02/01/19 0615  . piperacillin-tazobactam (ZOSYN)  IV 3.375 g (02/01/19 [id number])  . vancomycin 1,250 mg (01/31/19 1704)     LOS: 2 days   Time spent= 35 mins    Robert Tates Arsenio Loader, MD [facility]  If 7PM-7AM, please contact night-coverage  02/01/2019, 11:25 AM

## 2019-02-02 ENCOUNTER — Encounter (HOSPITAL_COMMUNITY): Payer: Self-pay | Admitting: Vascular Surgery

## 2019-02-02 ENCOUNTER — Other Ambulatory Visit (HOSPITAL_COMMUNITY): Payer: Medicare Other

## 2019-02-02 DIAGNOSIS — L03115 Cellulitis of right lower limb: Secondary | ICD-10-CM

## 2019-02-02 LAB — URINALYSIS, ROUTINE W REFLEX MICROSCOPIC
Bilirubin Urine: NEGATIVE
Glucose, UA: NEGATIVE mg/dL
Hgb urine dipstick: NEGATIVE
Ketones, ur: NEGATIVE mg/dL
Leukocytes,Ua: NEGATIVE
Nitrite: NEGATIVE
Protein, ur: NEGATIVE mg/dL
Specific Gravity, Urine: 1.015 (ref 1.005–1.030)
pH: 5 (ref 5.0–8.0)

## 2019-02-02 LAB — GLUCOSE, CAPILLARY
Glucose-Capillary: 108 mg/dL — ABNORMAL HIGH (ref 70–99)
Glucose-Capillary: 119 mg/dL — ABNORMAL HIGH (ref 70–99)
Glucose-Capillary: 95 mg/dL (ref 70–99)
Glucose-Capillary: 186 mg/dL — ABNORMAL HIGH (ref 70–99)

## 2019-02-02 LAB — MRSA PCR SCREENING: MRSA by PCR: NEGATIVE

## 2019-02-02 MED ORDER — VANCOMYCIN HCL IN DEXTROSE 1-5 GM/200ML-% IV SOLN
1000.0000 mg | INTRAVENOUS | Status: AC
  Administered 2019-02-02 – 2019-02-03 (×2): 1000 mg via INTRAVENOUS
  Filled 2019-02-02 (×2): qty 200

## 2019-02-02 MED ORDER — SACUBITRIL-VALSARTAN 49-51 MG PO TABS
1.0000 | ORAL_TABLET | Freq: Two times a day (BID) | ORAL | Status: DC
  Administered 2019-02-02 – 2019-02-10 (×15): 1 via ORAL
  Filled 2019-02-02 (×18): qty 1

## 2019-02-02 MED ORDER — CEFAZOLIN SODIUM-DEXTROSE 2-4 GM/100ML-% IV SOLN
2.0000 g | INTRAVENOUS | Status: AC
  Administered 2019-02-03: 2 g via INTRAVENOUS
  Filled 2019-02-02: qty 100

## 2019-02-02 MED ORDER — CHLORHEXIDINE GLUCONATE 4 % EX LIQD
60.0000 mL | Freq: Once | CUTANEOUS | Status: AC
  Administered 2019-02-03: 4 via TOPICAL
  Filled 2019-02-02: qty 60

## 2019-02-02 MED ORDER — POVIDONE-IODINE 10 % EX SWAB
2.0000 "application " | Freq: Once | CUTANEOUS | Status: DC
  Administered 2019-02-03: 2 via TOPICAL

## 2019-02-02 NOTE — Progress Notes (Addendum)
PROGRESS NOTE    Robert Horn  [id number] DOB: 19-Apr-1933 DOA: 01/30/2019 PCP: [name], MD   Brief Narrative:  85 y.o.malewith medical history significant ofhypertension, hyperlipidemia, heart failure with reduced ejection fraction (last echocardiogram with ejection fraction 40 to 45%),   severe aortic stenosis s/p TAVR, colon mass s/p laparoscopic partial colectomy on 12/15/2017 presents to emergency department due to worsening right foot pain, ulcer, swelling and redness since 10 days.  Failed outpatient Keflex treatment.  Found to have right foot necrotic wound for which orthopedic and vascular surgery were consulted.  He is status post right lower extremity revascularization by vascular surgery on 02/01/2019.  Orthopedic surgeon has evaluated and plans for fourth ray amputation tomorrow 02/03/2019.   Assessment & Plan:   Principal Problem:   Cellulitis of right foot Active Problems:   Essential hypertension   Hyperlipidemia   Chronic combined systolic and diastolic heart failure (HCC)   Severe aortic stenosis   S/P TAVR (transcatheter aortic valve replacement)   CKD (chronic kidney disease), stage III   Elevated LFTs   Colon cancer (HCC)   Anemia of chronic disease   Osteomyelitis of great toe of right foot (HCC)   Severe protein-calorie malnutrition (HCC)   Diabetic polyneuropathy associated with type 2 diabetes mellitus (HCC)   Cutaneous abscess of right foot  Right foot necrotic wound with surrounding cellulitis Peripheral vascular disease Vascular surgery and orthopedic is following. Dr. Sharol Given plan for procedure tomorrow.  Continue with  IV vancomycin and Zosyn. Status post  arteriogram right lower extremity with revascularization by Vascular surgery. Ortho plans for first ray amputation of right foot tomorrow 02/03/2019.   C/w aspirin and statin Continue with gentle IV fluid given HFrEF. Reassess fluid requirements tomorrow after surgical procedure.     Diabetes mellitus type 2, newly diagnosed.  Hemoglobin A1c 7.0 Continue with sliding scale insulin Fingersticks before meals and at bedtime Hypoglycemic protocol   Heart failure with reduced ejection fraction.  Last echocardiogram showed ejection fraction of 40%.  Appears stable and not in acute exacerbation. Continue with guideline directed medical therapy with Entresto, Coreg and high intensity statin. Low-sodium diet 2 g Water restriction to 1500 cc daily Daily weight  CKD stage IIIa.  Serum creatinine at baseline of 1.3-1.6. Continue to monitor renal function Avoid nephrotoxic agents.   Essential hypertension Continue with -Norvasc 10 mg daily, Coreg 3.125 mg twice daily  Anemia of chronic disease and iron deficiency Continue with iron supplementation and vitamin C.     History of severe aortic stenosis status post TAVR History of adenocarcinoma of the colon status post partial colectomy in 2019.  Stable.  No intervention at this time.  DVT prophylaxis: Lovenox Code Status: Full code Family Communication: Patients wife at bedside.                                               Disposition Plan: Orthopedics planning for surgical intervention tomorrow   Consultants:   Vascular  Orthopedic  Procedures: Right lower extremity angiogram with angioplasty/stent    Antimicrobials:   Vancomycin day 2  Zosyn day 10   Subjective: Patient is sitting out of bed, not in acute distress Just signed consent for his surgical intervention tomorrow by ortho.  Review of Systems Otherwise negative except as per HPI, including: General: Denies fever, chills, night sweats or unintended weight loss. Resp:  Denies cough, wheezing, shortness of breath. Cardiac: Denies chest pain, palpitations, orthopnea, paroxysmal nocturnal dyspnea. GI: Denies abdominal pain, nausea, vomiting, diarrhea or constipation GU: Denies dysuria, frequency, hesitancy or incontinence MS: Denies muscle  aches, joint pain or swelling Neuro: Denies headache, neurologic deficits (focal weakness, numbness, tingling), abnormal gait Psych: Denies anxiety, depression, SI/HI/AVH Skin: Denies new rashes or lesions ID: Denies sick contacts, exotic exposures, travel  Objective: Vitals:   02/02/19 0408 02/02/19 0500 02/02/19 1417 02/02/19 1500  BP:  131/65 (!) 142/66 (!) 142/72  Pulse:  (!) 50 60 (!) 56  Resp:  11 18 10   Temp:   97.6 F (36.4 C)   TempSrc:   Oral   SpO2:  98% 98% 100%  Weight: 100.7 kg     Height:        Intake/Output Summary (Last 24 hours) at 02/02/2019 1543 Last data filed at 02/02/2019 1300 Gross per 24 hour  Intake 1662.72 ml  Output 750 ml  Net 912.72 ml   Filed Weights   01/30/19 1431 01/30/19 2332 02/02/19 0408  Weight: 90.7 kg 90.7 kg 100.7 kg    Examination:  General exam: Appears calm and comfortable  Respiratory system: Clear to auscultation. Respiratory effort normal. Cardiovascular system: S1 & S2 heard, RRR. No JVD, murmurs, rubs, gallops or clicks. No pedal edema. Gastrointestinal system: Abdomen is nondistended, soft and nontender. No organomegaly or masses felt. Normal bowel sounds heard. Central nervous system: Alert and oriented. No focal neurological deficits. Extremities: Symmetric 5 x 5 power. Skin: Right lower extremity dressing in place. Psychiatry: Judgement and insight appear normal. Mood & affect appropriate.     Data Reviewed:   CBC: Recent Labs  Lab 01/26/19 1951 01/30/19 1433 01/30/19 2356 01/31/19 0324 02/01/19 0159  WBC 16.0* 11.5* 10.6* 10.3 8.8  NEUTROABS 13.1* 9.0*  --   --   --   HGB 11.0* 11.4* 10.3* 10.3* 10.6*  HCT 33.8* 36.7* 32.2* 31.8* 32.9*  MCV 91.4 93.6 91.0 91.1 89.9  PLT 225 238 PLATELET CLUMPS NOTED ON SMEAR, UNABLE TO ESTIMATE 185 [id number]   Basic Metabolic Panel: Recent Labs  Lab 01/26/19 1951 01/30/19 1433 01/30/19 2356 01/31/19 0324 02/01/19 0159  NA 135 135  --  138 140  K 4.3 4.3  --  4.4 4.5   CL 102 101  --  108 108  CO2 21* 22  --  21* 22  GLUCOSE 156* 169*  --  120* 111*  BUN 26* 27*  --  27* 24*  CREATININE 1.69* 1.58* 1.52* 1.39* 1.40*  CALCIUM 8.7* 9.0  --  8.7* 8.8*   GFR: Estimated Creatinine Clearance: 49.1 mL/min (A) (by C-G formula based on SCr of 1.4 mg/dL (H)). Liver Function Tests: Recent Labs  Lab 01/26/19 1951 01/30/19 1433 01/31/19 0324  AST 39 87* 69*  ALT 41 71* 65*  ALKPHOS 151* 201* 170*  BILITOT 1.1 0.9 0.6  PROT 7.5 8.0 6.5  ALBUMIN 3.0* 2.7* 2.3*   No results for input(s): LIPASE, AMYLASE in the last 168 hours. No results for input(s): AMMONIA in the last 168 hours. Coagulation Profile: Recent Labs  Lab 01/26/19 1951 02/01/19 0159  INR 1.2 1.1   Cardiac Enzymes: No results for input(s): CKTOTAL, CKMB, CKMBINDEX, TROPONINI in the last 168 hours. BNP (last 3 results) Recent Labs    11/24/18 0849  PROBNP 294   HbA1C: Recent Labs    01/31/19 0324  HGBA1C 7.0*   CBG: Recent Labs  Lab 02/02/19 0623 02/02/19 1241  GLUCAP 108* 119*   Lipid Profile: Recent Labs    01/30/19 2350  CHOL 110  HDL 22*  LDLCALC 71  TRIG 87  CHOLHDL 5.0   Thyroid Function Tests: No results for input(s): TSH, T4TOTAL, FREET4, T3FREE, THYROIDAB in the last 72 hours. Anemia Panel: No results for input(s): VITAMINB12, FOLATE, FERRITIN, TIBC, IRON, RETICCTPCT in the last 72 hours. Sepsis Labs: Recent Labs  Lab 01/26/19 1951 01/30/19 1433 01/30/19 1602  LATICACIDVEN 1.1 1.3 1.5    Recent Results (from the past 240 hour(s))  Culture, blood (Routine x 2)     Status: None   Collection Time: 01/26/19  7:30 PM   Specimen: BLOOD RIGHT ARM  Result Value Ref Range Status   Specimen Description BLOOD RIGHT ARM  Final   Special Requests   Final    BOTTLES DRAWN AEROBIC AND ANAEROBIC Blood Culture adequate volume   Culture   Final    NO GROWTH 5 DAYS Performed at [facility], [place] [address], [place], [place] [address]    Report Status  01/31/2019 FINAL  Final  Culture, blood (Routine x 2)     Status: None   Collection Time: 01/26/19  7:40 PM   Specimen: BLOOD RIGHT HAND  Result Value Ref Range Status   Specimen Description BLOOD RIGHT HAND  Final   Special Requests   Final    BOTTLES DRAWN AEROBIC ONLY Blood Culture adequate volume   Culture   Final    NO GROWTH 5 DAYS Performed at [facility], [place] [address] [place], [place] [address]    Report Status 01/31/2019 FINAL  Final  Blood culture (routine x 2)     Status: None (Preliminary result)   Collection Time: 01/30/19  2:49 PM   Specimen: BLOOD  Result Value Ref Range Status   Specimen Description BLOOD LEFT ANTECUBITAL  Final   Special Requests   Final    BOTTLES DRAWN AEROBIC AND ANAEROBIC Blood Culture results may not be optimal due to an inadequate volume of blood received in culture bottles   Culture   Final    NO GROWTH 3 DAYS Performed at [facility], [place] [address], [place], [place] [address]    Report Status PENDING  Incomplete  Blood culture (routine x 2)     Status: None (Preliminary result)   Collection Time: 01/30/19  3:37 PM   Specimen: BLOOD  Result Value Ref Range Status   Specimen Description BLOOD RIGHT ANTECUBITAL  Final   Special Requests   Final    BOTTLES DRAWN AEROBIC AND ANAEROBIC Blood Culture adequate volume   Culture   Final    NO GROWTH 3 DAYS Performed at [facility], [place] [address] [place],  [address]    Report Status PENDING  Incomplete  SARS CORONAVIRUS 2 (TAT 6-24 HRS) Nasopharyngeal Nasopharyngeal Swab     Status: None   Collection Time: 01/30/19  3:50 PM   Specimen: Nasopharyngeal Swab  Result Value Ref Range Status   SARS Coronavirus 2 NEGATIVE NEGATIVE Final    Comment: (NOTE) SARS-CoV-2 target nucleic acids are NOT DETECTED. The SARS-CoV-2 RNA is generally detectable in upper and lower respiratory specimens during the acute phase of infection. Negative results do not preclude  SARS-CoV-2 infection, do not rule out co-infections with other pathogens, and should not be used as the sole basis for treatment or other patient management decisions. Negative results must be combined with clinical observations, patient history, and  epidemiological information. The expected result is Negative. Fact Sheet for Patients: SugarRoll.be Fact Sheet for Healthcare Providers: https://www.woods-mathews.com/ This test is not yet approved or cleared by the [place] FDA and  has been authorized for detection and/or diagnosis of SARS-CoV-2 by FDA under an Emergency Use Authorization (EUA). This EUA will remain  in effect (meaning this test can be used) for the duration of the COVID-19 declaration under Section 56 4(b)(1) of the Act, 21 U.S.C. section 360bbb-3(b)(1), unless the authorization is terminated or revoked sooner. Performed at [facility], [place] [address] [place], [place] [address]          Radiology Studies: No results found.      Scheduled Meds:  amLODipine  10 mg Oral Daily   aspirin EC  81 mg Oral Daily   carvedilol  3.125 mg Oral BID WC   clopidogrel  75 mg Oral Q breakfast   enoxaparin (LOVENOX) injection  40 mg Subcutaneous Q24H   ferrous sulfate  325 mg Oral Q breakfast   furosemide  60 mg Oral Daily   omega-3 acid ethyl esters  1 capsule Oral Daily   rosuvastatin  10 mg Oral Daily   sodium chloride flush  3 mL Intravenous Once   sodium chloride flush  3 mL Intravenous Q12H   Continuous Infusions:  sodium chloride 100 mL/hr at 02/01/19 0615   sodium chloride     piperacillin-tazobactam (ZOSYN)  IV 3.375 g (02/02/19 0514)   vancomycin       LOS: 3 days   Total time spent for this encounter is 35 mins    Elie Confer, MD [facility] Pager: [phone]  If 7PM-7AM, please contact night-coverage  02/02/2019, 3:43 PM

## 2019-02-02 NOTE — Progress Notes (Signed)
Patient ID:  Robert Horn, male   DOB: 19-Oct-1933, 85 y.o.   MRN: [id number] Patient seen in follow-up for a necrotic ulceration right great toe MTP joint.  Patient is status post revascularization with excellent improvement of the circulation to the right lower extremity.  I have consulted patient and will plan for a right first ray amputation tomorrow Friday.  All questions were encouraged and answered.  Patient states he feels comfortable and wishes to proceed with surgery.

## 2019-02-02 NOTE — Progress Notes (Addendum)
[facility]  Subjective  - Doing well planned surgery with Dr. Sharol Given tomorrow.   Objective 135/69 (!) 50 98.1 F (36.7 C) (Oral) 11 98%  Intake/Output Summary (Last 24 hours) at 02/02/2019 0717 Last data filed at 02/02/2019 0600 Gross per 24 hour  Intake 1182.72 ml  Output 750 ml  Net 432.72 ml    Right doppler PT/DP brisk Left groin soft without hematoma Lungs non labored breathing  Assessment/Planning: POD # 1  Right SFA angioplasty with stent placement (angioplasty 5 mm x 120 mm Mustang, stent with 6 mm x 120 mm drug-coated Eluvia)   Right LE arterial flow patent Plan to return to the OR with Dr. Sharol Given tomorrow first ray amputation    Roxy Horseman 02/02/2019 7:17 AM --  Laboratory Lab Results: Recent Labs    01/31/19 0324 02/01/19 0159  WBC 10.3 8.8  HGB 10.3* 10.6*  HCT 31.8* 32.9*  PLT 185 217   BMET Recent Labs    01/31/19 0324 02/01/19 0159  NA 138 140  K 4.4 4.5  CL 108 108  CO2 21* 22  GLUCOSE 120* 111*  BUN 27* 24*  CREATININE 1.39* 1.40*  CALCIUM 8.7* 8.8*    COAG Lab Results  Component Value Date   INR 1.1 02/01/2019   INR 1.2 01/26/2019   INR 0.95 03/06/2018   No results found for: PTT  Agree with the above.  Excellent result with angio yesterday By Dr. Carlis Abbott.  Plan for amp with Dr Sharol Given tomorrow. Needs ASA and Plavix at discharge Follow  Up in 1 month Will sign off, please call with questions    Annamarie Major

## 2019-02-02 NOTE — Progress Notes (Signed)
Pharmacy Antibiotic Note  Robert Horn is a 85 y.o. male admitted on 01/30/2019.  Pharmacy has been consulted for vancomyicn dosing for cellulitis. Also on Zosyn per MD. Patient was recently seen in [facility] ED on 01/27/2019 and discharged on cephalexin for right foot infection. S/p aortogram, LE arteriogram 11/4 with plan for 1st ray amputation 11/6 per Ortho. SCr trend down to 1.4 - stable.   Plan: Adjust vancomycin to 1000 mg IV Q 24 hrs. Goal AUC 400-550. Expected AUC: 487 SCr used: 1.4 Zosyn 3.375g IV q8h (4h infusion) Monitor clinical progress, c/s, renal function, vancomycin levels as indicated F/u de-escalation plan/LOT post-op 11/6   Height: 6' (182.9 cm) Weight: 222 lb 0.1 oz (100.7 kg) IBW/kg (Calculated) : 77.6  Temp (24hrs), Avg:98.1 F (36.7 C), Min:98 F (36.7 C), Max:98.1 F (36.7 C)  Recent Labs  Lab 01/26/19 1951 01/30/19 1433 01/30/19 1602 01/30/19 2356 01/31/19 0324 02/01/19 0159  WBC 16.0* 11.5*  --  10.6* 10.3 8.8  CREATININE 1.69* 1.58*  --  1.52* 1.39* 1.40*  LATICACIDVEN 1.1 1.3 1.5  --   --   --     Estimated Creatinine Clearance: 49.1 mL/min (A) (by C-G formula based on SCr of 1.4 mg/dL (H)).    No Known Allergies  Antimicrobials this admission: Vancomycin 11/2 >> Zosyn x1 on 11/2; 11/3>>  Microbiology results: 11/2 BCx: ngtd   Elicia Lamp, PharmD, BCPS Please check AMION for all [facility] contact numbers Clinical Pharmacist 02/02/2019 10:49 AM

## 2019-02-02 NOTE — Progress Notes (Signed)
Patient no longer needs IV team at this time

## 2019-02-03 ENCOUNTER — Encounter (HOSPITAL_COMMUNITY)
Admission: EM | Disposition: A | Discharge status: Home Health | Payer: Self-pay | Source: Home / Self Care | Attending: Internal Medicine

## 2019-02-03 ENCOUNTER — Inpatient Hospital Stay (HOSPITAL_COMMUNITY): Payer: Medicare Other | Admitting: Certified Registered Nurse Anesthetist

## 2019-02-03 ENCOUNTER — Encounter (HOSPITAL_COMMUNITY): Payer: Self-pay

## 2019-02-03 HISTORY — PX: AMPUTATION: SHX166

## 2019-02-03 LAB — GLUCOSE, CAPILLARY
Glucose-Capillary: 110 mg/dL — ABNORMAL HIGH (ref 70–99)
Glucose-Capillary: 112 mg/dL — ABNORMAL HIGH (ref 70–99)
Glucose-Capillary: 98 mg/dL (ref 70–99)
Glucose-Capillary: 110 mg/dL — ABNORMAL HIGH (ref 70–99)
Glucose-Capillary: 183 mg/dL — ABNORMAL HIGH (ref 70–99)

## 2019-02-03 LAB — BASIC METABOLIC PANEL
Anion gap: 8 (ref 5–15)
BUN: 16 mg/dL (ref 8–23)
CO2: 23 mmol/L (ref 22–32)
Calcium: 8.5 mg/dL — ABNORMAL LOW (ref 8.9–10.3)
Chloride: 107 mmol/L (ref 98–111)
Creatinine, Ser: 1.32 mg/dL — ABNORMAL HIGH (ref 0.61–1.24)
GFR calc Af Amer: 57 mL/min — ABNORMAL LOW (ref >60)
GFR calc non Af Amer: 50 mL/min — ABNORMAL LOW (ref >60)
Glucose, Bld: 119 mg/dL — ABNORMAL HIGH (ref 70–99)
Potassium: 4.1 mmol/L (ref 3.5–5.1)
Sodium: 138 mmol/L (ref 135–145)

## 2019-02-03 LAB — MAGNESIUM: Magnesium: 2.2 mg/dL (ref 1.7–2.4)

## 2019-02-03 LAB — CBC
HCT: 31.2 % — ABNORMAL LOW (ref 39.0–52.0)
Hemoglobin: 9.8 g/dL — ABNORMAL LOW (ref 13.0–17.0)
MCH: 29 pg (ref 26.0–34.0)
MCHC: 31.4 g/dL (ref 30.0–36.0)
MCV: 92.3 fL (ref 80.0–100.0)
Platelets: 274 10*3/uL (ref 150–400)
RBC: 3.38 MIL/uL — ABNORMAL LOW (ref 4.22–5.81)
RDW: 13.2 % (ref 11.5–15.5)
WBC: 8.9 10*3/uL (ref 4.0–10.5)
nRBC: 0 % (ref 0.0–0.2)

## 2019-02-03 LAB — PHOSPHORUS: Phosphorus: 3.5 mg/dL (ref 2.5–4.6)

## 2019-02-03 SURGERY — AMPUTATION, FOOT, RAY
Anesthesia: Monitor Anesthesia Care | Site: Foot | Laterality: Right

## 2019-02-03 MED ORDER — PROPOFOL 10 MG/ML IV BOLUS
INTRAVENOUS | Status: AC
  Filled 2019-02-03: qty 20

## 2019-02-03 MED ORDER — METOCLOPRAMIDE HCL 5 MG PO TABS: 5.0000 mg | ORAL_TABLET | Freq: Three times a day (TID) | ORAL | Status: DC | PRN

## 2019-02-03 MED ORDER — BUPIVACAINE-EPINEPHRINE 0.25% -1:200000 IJ SOLN
INTRAMUSCULAR | Status: DC | PRN
  Administered 2019-02-03: 30 mL

## 2019-02-03 MED ORDER — LIVING WELL WITH DIABETES BOOK
Freq: Once | Status: AC
  Administered 2019-02-03: 17:00:00
  Filled 2019-02-03: qty 1

## 2019-02-03 MED ORDER — PIPERACILLIN-TAZOBACTAM 3.375 G IVPB
3.3750 g | Freq: Three times a day (TID) | INTRAVENOUS | Status: AC
  Administered 2019-02-03 – 2019-02-04 (×2): 3.375 g via INTRAVENOUS
  Filled 2019-02-03 (×2): qty 50

## 2019-02-03 MED ORDER — GABAPENTIN 300 MG PO CAPS
300.0000 mg | ORAL_CAPSULE | Freq: Three times a day (TID) | ORAL | Status: DC
  Administered 2019-02-03 – 2019-02-10 (×22): 300 mg via ORAL
  Filled 2019-02-03 (×22): qty 1

## 2019-02-03 MED ORDER — DOCUSATE SODIUM 100 MG PO CAPS
100.0000 mg | ORAL_CAPSULE | Freq: Two times a day (BID) | ORAL | Status: DC
  Administered 2019-02-04 – 2019-02-08 (×8): 100 mg via ORAL
  Filled 2019-02-03 (×12): qty 1

## 2019-02-03 MED ORDER — LIDOCAINE 2% (20 MG/ML) 5 ML SYRINGE
INTRAMUSCULAR | Status: AC
  Filled 2019-02-03: qty 5

## 2019-02-03 MED ORDER — PROPOFOL 500 MG/50ML IV EMUL
INTRAVENOUS | Status: DC | PRN
  Administered 2019-02-03: 100 ug/kg/min via INTRAVENOUS

## 2019-02-03 MED ORDER — METOCLOPRAMIDE HCL 5 MG/ML IJ SOLN: 5.0000 mg | Freq: Three times a day (TID) | INTRAMUSCULAR | Status: DC | PRN

## 2019-02-03 MED ORDER — FENTANYL CITRATE (PF) 250 MCG/5ML IJ SOLN
INTRAMUSCULAR | Status: AC
  Filled 2019-02-03: qty 5

## 2019-02-03 MED ORDER — ONDANSETRON HCL 4 MG/2ML IJ SOLN
INTRAMUSCULAR | Status: AC
  Filled 2019-02-03: qty 2

## 2019-02-03 MED ORDER — DEXAMETHASONE SODIUM PHOSPHATE 10 MG/ML IJ SOLN
INTRAMUSCULAR | Status: AC
  Filled 2019-02-03: qty 1

## 2019-02-03 MED ORDER — 0.9 % SODIUM CHLORIDE (POUR BTL) OPTIME
TOPICAL | Status: DC | PRN
  Administered 2019-02-03: 1000 mL

## 2019-02-03 MED ORDER — DEXAMETHASONE SODIUM PHOSPHATE 10 MG/ML IJ SOLN
INTRAMUSCULAR | Status: DC | PRN
  Administered 2019-02-03: 4 mg via INTRAVENOUS

## 2019-02-03 MED ORDER — ONDANSETRON HCL 4 MG/2ML IJ SOLN
INTRAMUSCULAR | Status: DC | PRN
  Administered 2019-02-03: 4 mg via INTRAVENOUS

## 2019-02-03 SURGICAL SUPPLY — 33 items
BLADE SAW SGTL MED 73X18.5 STR (BLADE) IMPLANT
BLADE SURG 21 STRL SS (BLADE) ×2 IMPLANT
BNDG COHESIVE 4X5 TAN STRL (GAUZE/BANDAGES/DRESSINGS) ×2 IMPLANT
BNDG GAUZE ELAST 4 BULKY (GAUZE/BANDAGES/DRESSINGS) ×2 IMPLANT
CANISTER WOUNDNEG PRESSURE 500 (CANNISTER) ×1 IMPLANT
COVER SURGICAL LIGHT HANDLE (MISCELLANEOUS) ×4 IMPLANT
COVER WAND RF STERILE (DRAPES) ×2 IMPLANT
DRAPE INCISE IOBAN 66X45 STRL (DRAPES) ×1 IMPLANT
DRAPE U-SHAPE 47X51 STRL (DRAPES) ×4 IMPLANT
DRSG ADAPTIC 3X8 NADH LF (GAUZE/BANDAGES/DRESSINGS) ×2 IMPLANT
DRSG PAD ABDOMINAL 8X10 ST (GAUZE/BANDAGES/DRESSINGS) ×4 IMPLANT
DURAPREP 26ML APPLICATOR (WOUND CARE) ×2 IMPLANT
ELECT REM PT RETURN 9FT ADLT (ELECTROSURGICAL) ×2
ELECTRODE REM PT RTRN 9FT ADLT (ELECTROSURGICAL) ×1 IMPLANT
GAUZE SPONGE 4X4 12PLY STRL (GAUZE/BANDAGES/DRESSINGS) ×2 IMPLANT
GLOVE BIOGEL PI IND STRL 9 (GLOVE) ×1 IMPLANT
GLOVE BIOGEL PI INDICATOR 9 (GLOVE) ×1
GLOVE SURG ORTHO 9.0 STRL STRW (GLOVE) ×2 IMPLANT
GOWN STRL REUS W/ TWL XL LVL3 (GOWN DISPOSABLE) ×2 IMPLANT
GOWN STRL REUS W/TWL XL LVL3 (GOWN DISPOSABLE) ×4
KIT BASIN OR (CUSTOM PROCEDURE TRAY) ×2 IMPLANT
KIT PREVENA INCISION MGT 13 (CANNISTER) ×1 IMPLANT
KIT TURNOVER KIT B (KITS) ×2 IMPLANT
NEEDLE HYPO 22GX1.5 SAFETY (NEEDLE) ×1 IMPLANT
NS IRRIG 1000ML POUR BTL (IV SOLUTION) ×2 IMPLANT
PACK ORTHO EXTREMITY (CUSTOM PROCEDURE TRAY) ×2 IMPLANT
PAD ARMBOARD 7.5X6 YLW CONV (MISCELLANEOUS) ×4 IMPLANT
STOCKINETTE IMPERVIOUS LG (DRAPES) IMPLANT
SUT ETHILON 2 0 PSLX (SUTURE) ×3 IMPLANT
SYR CONTROL 10ML LL (SYRINGE) ×1 IMPLANT
TOWEL GREEN STERILE (TOWEL DISPOSABLE) ×2 IMPLANT
TUBE CONNECTING 12X1/4 (SUCTIONS) ×2 IMPLANT
YANKAUER SUCT BULB TIP NO VENT (SUCTIONS) ×2 IMPLANT

## 2019-02-03 NOTE — Interval H&P Note (Signed)
History and Physical Interval Note:  02/03/2019 8:47 AM  Robert Horn  has presented today for surgery, with the diagnosis of Gangrene Right Foot.  The various methods of treatment have been discussed with the patient and family. After consideration of risks, benefits and other options for treatment, the patient has consented to  Procedure(s): RIGHT FOOT 1ST RAY AMPUTATION (Right) as a surgical intervention.  The patient's history has been reviewed, patient examined, no change in status, stable for surgery.  I have reviewed the patient's chart and labs.  Questions were answered to the patient's satisfaction.     Bevely Palmer Josefine Fuhr

## 2019-02-03 NOTE — Anesthesia Postprocedure Evaluation (Signed)
Anesthesia Post Note  Patient: George Haggart  Procedure(s) Performed: RIGHT FOOT 1ST RAY AMPUTATION (Right Foot)     Patient location during evaluation: PACU Anesthesia Type: MAC Level of consciousness: awake and alert Pain management: pain level controlled Vital Signs Assessment: post-procedure vital signs reviewed and stable Respiratory status: spontaneous breathing, nonlabored ventilation, respiratory function stable and patient connected to nasal cannula oxygen Cardiovascular status: stable and blood pressure returned to baseline Postop Assessment: no apparent nausea or vomiting Anesthetic complications: no    Last Vitals:  Vitals:   02/03/19 1152 02/03/19 1300  BP: 100/88 (!) 156/76  Pulse: (!) 47 (!) 52  Resp: 14 18  Temp: (!) 36.1 C   SpO2: 99% 97%    Last Pain:  Vitals:   02/03/19 1152  TempSrc:   PainSc: 0-No pain                 Praneeth Bussey DAVID

## 2019-02-03 NOTE — Progress Notes (Signed)
Physical Therapy Treatment Patient Details Name: Robert Horn MRN: [id number] DOB: 1933-09-09 Today's Date: 02/03/2019    History of Present Illness Diomar Breese is a 85 y.o. male with medical history significant of hypertension, hyperlipidemia, combined systolic and diastolic congestive heart failure with ejection fraction of 40 to 45%, severe aortic stenosis s/p TAVR, colon mass s/p laparoscopic partial colectomy on 12/15/2017 presents to emergency department due to worsening right foot pain, ulcer, swelling and redness since 10 days.  Patient reports that he stepped on something in his yard and he twisted his right foot inside of shoes.  Since then he has swelling, redness and severe pain in his right foot.  Reports that he came to the emergency department on 01/27/2019 and was prescribed Keflex however his symptoms was not improving so he decided to come to the emergency department for further evaluation and management.    PT Comments    Patient progressing well towards PT goals. Improved ambulation distance from prior session. Wanted to use RW for safety. Balance looked pretty good with only mild deficits noted with turning. HR ranged from 49-85 bpm. Pt plans to go to OR for right toe amputation today, 11/6. Will follow up post surgery to determine if any new needs arise. Will follow.    Follow Up Recommendations  Home health PT;Supervision/Assistance - 24 hour     Equipment Recommendations  Rolling walker with 5" wheels;3in1 (PT)    Recommendations for Other Services       Precautions / Restrictions Precautions Precautions: Fall Restrictions Weight Bearing Restrictions: No    Mobility  Bed Mobility Overal bed mobility: Modified Independent             General bed mobility comments: Able to get to EOB and into bed without issues.  Transfers Overall transfer level: Needs assistance Equipment used: Rolling walker (2 wheeled) Transfers: Sit to/from Stand Sit to Stand:  Supervision         General transfer comment: SUpervision for safety; cues for hand placement.  Ambulation/Gait Ambulation/Gait assistance: Min guard;Supervision Gait Distance (Feet): 400 Feet   Gait Pattern/deviations: Step-through pattern;Decreased stride length;Trunk flexed Gait velocity: 1.32 ft/sec Gait velocity interpretation: 1.31 - 2.62 ft/sec, indicative of limited community ambulator General Gait Details: Mildly unsteady gait with RW for support; mild difficulty with turns. No overt LOB. HR 49-85 bpm.   Stairs             Wheelchair Mobility    Modified Rankin (Stroke Patients Only)       Balance Overall balance assessment: Needs assistance Sitting-balance support: Feet supported;No upper extremity supported Sitting balance-Leahy Scale: Good     Standing balance support: During functional activity Standing balance-Leahy Scale: Fair                              Cognition Arousal/Alertness: Awake/alert Behavior During Therapy: WFL for tasks assessed/performed Overall Cognitive Status: Within Functional Limits for tasks assessed                                 General Comments: not forthcoming with conversation/info      Exercises      General Comments General comments (skin integrity, edema, etc.): pre activity BP 139/73, post activity BP 154/75      Pertinent Vitals/Pain Pain Assessment: No/denies pain    Home Living  Prior Function            PT Goals (current goals can now be found in the care plan section) Progress towards PT goals: Progressing toward goals    Frequency    Min 3X/week      PT Plan Current plan remains appropriate    Co-evaluation              AM-PAC PT "6 Clicks" Mobility   Outcome Measure  Help needed turning from your back to your side while in a flat bed without using bedrails?: None Help needed moving from lying on your back to sitting on  the side of a flat bed without using bedrails?: None Help needed moving to and from a bed to a chair (including a wheelchair)?: A Little Help needed standing up from a chair using your arms (e.g., wheelchair or bedside chair)?: A Little Help needed to walk in hospital room?: A Little Help needed climbing 3-5 steps with a railing? : A Little 6 Click Score: 20    End of Session Equipment Utilized During Treatment: Gait belt Activity Tolerance: Patient tolerated treatment well Patient left: in bed;with call bell/phone within reach Nurse Communication: Mobility status PT Visit Diagnosis: Muscle weakness (generalized) (M62.81)     Time: [id number] PT Time Calculation (min) (ACUTE ONLY): 16 min  Charges:  $Gait Training: 8-22 mins                     Marisa Severin, PT, DPT Acute Rehabilitation Services Pager [phone] Office [phone]       Marguarite Arbour A [place] 02/03/2019, 9:07 AM

## 2019-02-03 NOTE — Progress Notes (Addendum)
PROGRESS NOTE    Robert Horn  [id number] DOB: Jul 13, 1933 DOA: 01/30/2019 PCP: [name], MD   Brief Narrative:  85 y.o.malewith medical history significant ofhypertension, hyperlipidemia, heart failure with reduced ejection fraction (last echocardiogram with ejection fraction 40 to 45%),   severe aortic stenosis s/p TAVR, colon mass s/p laparoscopic partial colectomy on 12/15/2017 presents to emergency department due to worsening right foot pain, ulcer, swelling and redness since 10 days.  Failed outpatient Keflex treatment.  Found to have right foot necrotic wound for which orthopedic and vascular surgery were consulted.  He is status post right lower extremity revascularization by vascular surgery on 02/01/2019.  He is awaiting work trip for first ray amputation of right foot by orthopedics today.   Assessment & Plan:   Principal Problem:   Cellulitis of right foot Active Problems:   Essential hypertension   Hyperlipidemia   Chronic combined systolic and diastolic heart failure (HCC)   Severe aortic stenosis   S/P TAVR (transcatheter aortic valve replacement)   CKD (chronic kidney disease), stage III   Elevated LFTs   Colon cancer (HCC)   Anemia of chronic disease   Osteomyelitis of great toe of right foot (HCC)   Severe protein-calorie malnutrition (HCC)   Diabetic polyneuropathy associated with type 2 diabetes mellitus (HCC)   Cutaneous abscess of right foot  Right foot necrotic wound with surrounding cellulitis Peripheral vascular disease Vascular surgery and orthopedic is following. Dr. Sharol Given plan for procedure tomorrow.  Continue with  IV vancomycin and Zosyn. Status post  arteriogram right lower extremity with revascularization by Vascular surgery. Ortho plans for first ray amputation of right foot tomorrow 02/03/2019.   C/w aspirin and statin Continue with gentle IV fluid given HFrEF. Reassess fluid requirements tomorrow after surgical procedure.    Diabetes  mellitus type 2, newly diagnosed.  Hemoglobin A1c 7.0 Continue with sliding scale insulin Fingersticks before meals and at bedtime Hypoglycemic protocol   Heart failure with reduced ejection fraction.  Last echocardiogram showed ejection fraction of 40%.  Appears stable and not in acute exacerbation. Continue with guideline directed medical therapy with Entresto, Coreg and high intensity statin. Low-sodium diet 2 g Water restriction to 1500 cc daily Daily weight  CKD stage IIIa.  Serum creatinine at baseline of 1.3-1.6. Continue to monitor renal function Avoid nephrotoxic agents.   Essential hypertension Continue with -Norvasc 10 mg daily, Coreg 3.125 mg twice daily  Anemia of chronic disease and iron deficiency Continue with iron supplementation and vitamin C.     History of severe aortic stenosis status post TAVR History of adenocarcinoma of the colon status post partial colectomy in 2019.  Stable.  No intervention at this time.  DVT prophylaxis: Lovenox Code Status: Full code Family Communication: Patients wife at bedside.                                               Disposition Plan: Orthopedics planning for surgical intervention today.  Consultants:   Vascular  Orthopedic  Procedures: Right lower extremity angiogram with angioplasty/stent    Antimicrobials:   Vancomycin day 2  Zosyn day 10   Subjective: Patient is sitting out of bed, not in acute distress Just signed consent for his surgical intervention tomorrow by ortho.  Review of Systems Otherwise negative except as per HPI, including: General: Denies fever, chills, night sweats or unintended weight  loss. Resp: Denies cough, wheezing, shortness of breath. Cardiac: Denies chest pain, palpitations, orthopnea, paroxysmal nocturnal dyspnea. GI: Denies abdominal pain, nausea, vomiting, diarrhea or constipation GU: Denies dysuria, frequency, hesitancy or incontinence MS: Denies muscle aches, joint pain  or swelling Neuro: Denies headache, neurologic deficits (focal weakness, numbness, tingling), abnormal gait Psych: Denies anxiety, depression, SI/HI/AVH Skin: Denies new rashes or lesions ID: Denies sick contacts, exotic exposures, travel  Objective: Vitals:   02/03/19 1037 02/03/19 1122 02/03/19 1137 02/03/19 1152  BP:  121/61 135/65 100/88  Pulse:  (!) 48 (!) 53 (!) 47  Resp:  11 14 14   Temp:  (!) 97 F (36.1 C)  (!) 97 F (36.1 C)  TempSrc:      SpO2:  100% 97% 99%  Weight: 99.5 kg     Height: 6' (1.829 m)       Intake/Output Summary (Last 24 hours) at 02/03/2019 1246 Last data filed at 02/03/2019 1159 Gross per 24 hour  Intake 1161.57 ml  Output 1100 ml  Net 61.57 ml   Filed Weights   02/02/19 0408 02/03/19 0450 02/03/19 1037  Weight: 100.7 kg 99.5 kg 99.5 kg    Examination:  General exam: Appears calm and comfortable  Respiratory system: Clear to auscultation. Respiratory effort normal. Cardiovascular system: S1 & S2 heard, RRR. No JVD, murmurs, rubs, gallops or clicks. No pedal edema. Gastrointestinal system: Abdomen is nondistended, soft and nontender. No organomegaly or masses felt. Normal bowel sounds heard. Central nervous system: Alert and oriented. No focal neurological deficits. Extremities: Symmetric 5 x 5 power. Skin: Right lower extremity dressing in place. Psychiatry: Judgement and insight appear normal. Mood & affect appropriate.     Data Reviewed:   CBC: Recent Labs  Lab 01/30/19 1433 01/30/19 2356 01/31/19 0324 02/01/19 0159 02/03/19 0222  WBC 11.5* 10.6* 10.3 8.8 8.9  NEUTROABS 9.0*  --   --   --   --   HGB 11.4* 10.3* 10.3* 10.6* 9.8*  HCT 36.7* 32.2* 31.8* 32.9* 31.2*  MCV 93.6 91.0 91.1 89.9 92.3  PLT 238 PLATELET CLUMPS NOTED ON SMEAR, UNABLE TO ESTIMATE 185 217 [id number]   Basic Metabolic Panel: Recent Labs  Lab 01/30/19 1433 01/30/19 2356 01/31/19 0324 02/01/19 0159 02/03/19 0222  NA 135  --  138 140 138  K 4.3  --  4.4 4.5  4.1  CL 101  --  108 108 107  CO2 22  --  21* 22 23  GLUCOSE 169*  --  120* 111* 119*  BUN 27*  --  27* 24* 16  CREATININE 1.58* 1.52* 1.39* 1.40* 1.32*  CALCIUM 9.0  --  8.7* 8.8* 8.5*  MG  --   --   --   --  2.2  PHOS  --   --   --   --  3.5   GFR: Estimated Creatinine Clearance: 51.8 mL/min (A) (by C-G formula based on SCr of 1.32 mg/dL (H)). Liver Function Tests: Recent Labs  Lab 01/30/19 1433 01/31/19 0324  AST 87* 69*  ALT 71* 65*  ALKPHOS 201* 170*  BILITOT 0.9 0.6  PROT 8.0 6.5  ALBUMIN 2.7* 2.3*   No results for input(s): LIPASE, AMYLASE in the last 168 hours. No results for input(s): AMMONIA in the last 168 hours. Coagulation Profile: Recent Labs  Lab 02/01/19 0159  INR 1.1   Cardiac Enzymes: No results for input(s): CKTOTAL, CKMB, CKMBINDEX, TROPONINI in the last 168 hours. BNP (last 3 results) Recent Labs  11/24/18 0849  PROBNP 294   HbA1C: No results for input(s): HGBA1C in the last 72 hours. CBG: Recent Labs  Lab 02/02/19 2140 02/03/19 0617 02/03/19 1033 02/03/19 1124 02/03/19 1226  GLUCAP 186* 110* 98 112* 110*   Lipid Profile: No results for input(s): CHOL, HDL, LDLCALC, TRIG, CHOLHDL, LDLDIRECT in the last 72 hours. Thyroid Function Tests: No results for input(s): TSH, T4TOTAL, FREET4, T3FREE, THYROIDAB in the last 72 hours. Anemia Panel: No results for input(s): VITAMINB12, FOLATE, FERRITIN, TIBC, IRON, RETICCTPCT in the last 72 hours. Sepsis Labs: Recent Labs  Lab 01/30/19 1433 01/30/19 1602  LATICACIDVEN 1.3 1.5    Recent Results (from the past 240 hour(s))  Culture, blood (Routine x 2)     Status: None   Collection Time: 01/26/19  7:30 PM   Specimen: BLOOD RIGHT ARM  Result Value Ref Range Status   Specimen Description BLOOD RIGHT ARM  Final   Special Requests   Final    BOTTLES DRAWN AEROBIC AND ANAEROBIC Blood Culture adequate volume   Culture   Final    NO GROWTH 5 DAYS Performed at [facility], [address], [place], [place] [address]    Report Status 01/31/2019 FINAL  Final  Culture, blood (Routine x 2)     Status: None   Collection Time: 01/26/19  7:40 PM   Specimen: BLOOD RIGHT HAND  Result Value Ref Range Status   Specimen Description BLOOD RIGHT HAND  Final   Special Requests   Final    BOTTLES DRAWN AEROBIC ONLY Blood Culture adequate volume   Culture   Final    NO GROWTH 5 DAYS Performed at [facility], [place] [address] [place], [place] [address]    Report Status 01/31/2019 FINAL  Final  Blood culture (routine x 2)     Status: None (Preliminary result)   Collection Time: 01/30/19  2:49 PM   Specimen: BLOOD  Result Value Ref Range Status   Specimen Description BLOOD LEFT ANTECUBITAL  Final   Special Requests   Final    BOTTLES DRAWN AEROBIC AND ANAEROBIC Blood Culture results may not be optimal due to an inadequate volume of blood received in culture bottles   Culture   Final    NO GROWTH 3 DAYS Performed at [facility], [place] [address] [place], [place] [address]    Report Status PENDING  Incomplete  Blood culture (routine x 2)     Status: None (Preliminary result)   Collection Time: 01/30/19  3:37 PM   Specimen: BLOOD  Result Value Ref Range Status   Specimen Description BLOOD RIGHT ANTECUBITAL  Final   Special Requests   Final    BOTTLES DRAWN AEROBIC AND ANAEROBIC Blood Culture adequate volume   Culture   Final    NO GROWTH 3 DAYS Performed at [facility], [place] [address], [place], [place] [address]    Report Status PENDING  Incomplete  SARS CORONAVIRUS 2 (TAT 6-24 HRS) Nasopharyngeal Nasopharyngeal Swab     Status: None   Collection Time: 01/30/19  3:50 PM   Specimen: Nasopharyngeal Swab  Result Value Ref Range Status   SARS Coronavirus 2 NEGATIVE NEGATIVE Final    Comment: (NOTE) SARS-CoV-2 target nucleic acids are NOT DETECTED. The SARS-CoV-2 RNA is generally detectable in upper and lower respiratory specimens during the acute phase of  infection. Negative results do not preclude SARS-CoV-2 infection, do not rule out co-infections with other pathogens, and should  not be used as the sole basis for treatment or other patient management decisions. Negative results must be combined with clinical observations, patient history, and epidemiological information. The expected result is Negative. Fact Sheet for Patients: SugarRoll.be Fact Sheet for Healthcare Providers: https://www.woods-mathews.com/ This test is not yet approved or cleared by the [place] FDA and  has been authorized for detection and/or diagnosis of SARS-CoV-2 by FDA under an Emergency Use Authorization (EUA). This EUA will remain  in effect (meaning this test can be used) for the duration of the COVID-19 declaration under Section 56 4(b)(1) of the Act, 21 U.S.C. section 360bbb-3(b)(1), unless the authorization is terminated or revoked sooner. Performed at [facility], [place] [address], [place], [place] [address]   MRSA PCR Screening     Status: None   Collection Time: 02/02/19  6:53 PM   Specimen: Nasal Mucosa; Nasopharyngeal  Result Value Ref Range Status   MRSA by PCR NEGATIVE NEGATIVE Final    Comment:        The GeneXpert MRSA Assay (FDA approved for NASAL specimens only), is one component of a comprehensive MRSA colonization surveillance program. It is not intended to diagnose MRSA infection nor to guide or monitor treatment for MRSA infections. Performed at [facility], [place] [address], [place], [place] [address]          Radiology Studies: No results found.      Scheduled Meds: . amLODipine  10 mg Oral Daily  . aspirin EC  81 mg Oral Daily  . carvedilol  3.125 mg Oral BID WC  . clopidogrel  75 mg Oral Q breakfast  . docusate sodium  100 mg Oral BID  . enoxaparin (LOVENOX) injection  40 mg Subcutaneous Q24H  . ferrous sulfate  325 mg Oral Q breakfast  . furosemide  60  mg Oral Daily  . gabapentin  300 mg Oral TID  . rosuvastatin  10 mg Oral Daily  . sacubitril-valsartan  1 tablet Oral BID  . sodium chloride flush  3 mL Intravenous Once  . sodium chloride flush  3 mL Intravenous Q12H   Continuous Infusions: . sodium chloride    . vancomycin Stopped (02/02/19 2047)     LOS: 4 days   Total time spent for this encounter is 25 mins    Elie Confer, MD [facility] Pager: [phone]  If 7PM-7AM, please contact night-coverage  02/03/2019, 12:46 PM

## 2019-02-03 NOTE — Op Note (Signed)
02/03/2019  11:17 AM  PATIENT:  Robert Horn    PRE-OPERATIVE DIAGNOSIS:  Gangrene Right Foot  With osteomyelitis of the first metatarsal and abscess   POST-OPERATIVE DIAGNOSIS:  Same  PROCEDURE:  RIGHT FOOT 1ST RAY AMPUTATION Local tissue rearrangement for wound closure 10 x 4 cm. Application of Praveena wound VAC 13 cm  SURGEON:  [name], MD  PHYSICIAN ASSISTANT:None ANESTHESIA:   General  PREOPERATIVE INDICATIONS:  Robert Horn is a  85 y.o. male with a diagnosis of Gangrene Right Foot who failed conservative measures and elected for surgical management.    The risks benefits and alternatives were discussed with the patient preoperatively including but not limited to the risks of infection, bleeding, nerve injury, cardiopulmonary complications, the need for revision surgery, among others, and the patient was willing to proceed.  OPERATIVE IMPLANTS: Praveena wound VAC 13 cm  _0 @  OPERATIVE FINDINGS: Healthy tissue at margins.  OPERATIVE PROCEDURE: Patient was brought the operating room and underwent a MAC anesthetic.  The right lower extremity was then prepped using DuraPrep draped into a sterile field a timeout was called.  Patient underwent a local block with 30 cc of quarter percent Marcaine plain.  Elliptical incision was made around the ulcerative tissue in the first ray.  The first ray right foot and ulcerative tissue was resected in one block of tissue.  This left a wound that was 4 x 10 cm.  The wound was irrigated with normal saline the margins were healthy and viable.  Electrocautery was used for hemostasis.  Local tissue rearrangement was used to close the wound 4 x 10 cm.  A 13 cm wound VAC was applied this had a good suction fit patient was taken the PACU in stable condition.   DISCHARGE PLANNING:  Antibiotic duration: Continue antibiotics for 24 hours  Weightbearing: Nonweightbearing on the right  Pain medication: Opioid pathway  Dressing care/  Wound VAC: Continue wound VAC and transition to the [place] wound VAC at discharge  Ambulatory devices: Walker  Discharge to: Anticipate discharge to home  Follow-up: In the office 1 week post operative.

## 2019-02-03 NOTE — Progress Notes (Signed)
Physical Therapy Treatment Patient Details Name: Robert Horn MRN: [id number] DOB: 1934-03-25 Today's Date: 02/03/2019    History of Present Illness Robert Horn is a 85 y.o. male with medical history significant of hypertension, hyperlipidemia, combined systolic and diastolic congestive heart failure with ejection fraction of 40 to 45%, severe aortic stenosis s/p TAVR, colon mass s/p laparoscopic partial colectomy on 12/15/2017 presents to emergency department due to worsening right foot pain, ulcer, swelling and redness since 10 days.  Pt underwentRLE revascularization on 11/4 followed by R 1st ray amputation on 11/6, now NWB RLE.    PT Comments    PT performing re-evaluation as patient undergoing R 1st ray amputation and now NWB through RLE. Pt presents with deficits in functional mobility, gait, balance, and endurance. Pt is able to transfer and initiate hops for gait training with minA and use of RW. Pt and spouse inquisitive about possibility of use of knee scooter for mobility, pt will benefit from possible assessment with knee scooter next session, as well as further gait and transfer training to [facility] reinforce precautions.  Follow Up Recommendations  Home health PT;Supervision/Assistance - 24 hour     Equipment Recommendations  3in1 (PT);Other (comment)(knee scooter)    Recommendations for Other Services       Precautions / Restrictions Precautions Precautions: Fall Restrictions Weight Bearing Restrictions: Yes RLE Weight Bearing: Non weight bearing    Mobility  Bed Mobility Overal bed mobility: Independent                Transfers Overall transfer level: Needs assistance Equipment used: Rolling walker (2 wheeled) Transfers: Sit to/from [facility] Sit to Stand: Min assist Stand pivot transfers: Min assist       General transfer comment: Pt requiring cues for hand placement, rocking to gain momentum, and reinforcement of WB  precautions  Ambulation/Gait Ambulation/Gait assistance: Min assist Gait Distance (Feet): 1 Feet Assistive device: Rolling walker (2 wheeled) Gait Pattern/deviations: (hop to pattern) Gait velocity: reduced Gait velocity interpretation: <1.31 ft/sec, indicative of household ambulator General Gait Details: pt able to hop on LLE and use of BUE for 1-2 feet from bed to recliner   Stairs             Wheelchair Mobility    Modified Rankin (Stroke Patients Only)       Balance Overall balance assessment: Independent Sitting-balance support: Feet unsupported;Single extremity supported Sitting balance-Leahy Scale: Good     Standing balance support: Bilateral upper extremity supported Standing balance-Leahy Scale: Good Standing balance comment: close supervision                            Cognition Arousal/Alertness: Awake/alert Behavior During Therapy: WFL for tasks assessed/performed Overall Cognitive Status: Within Functional Limits for tasks assessed                                        Exercises      General Comments General comments (skin integrity, edema, etc.): VSS, room air      Pertinent Vitals/Pain Pain Assessment: Faces Faces Pain Scale: Hurts even more Pain Location: right foot Pain Descriptors / Indicators: Grimacing;Guarding Pain Intervention(s): Limited activity within patient's tolerance    Home Living                      Prior Function  PT Goals (current goals can now be found in the care plan section) Acute Rehab PT Goals Patient Stated Goal: to go home PT Goal Formulation: With patient/family Time For Goal Achievement: 02/17/19 Potential to Achieve Goals: Good Progress towards PT goals: Goals downgraded-see care plan    Frequency    Min 3X/week      PT Plan Current plan remains appropriate    Co-evaluation              AM-PAC PT "6 Clicks" Mobility   Outcome  Measure  Help needed turning from your back to your side while in a flat bed without using bedrails?: None Help needed moving from lying on your back to sitting on the side of a flat bed without using bedrails?: None Help needed moving to and from a bed to a chair (including a wheelchair)?: A Little Help needed standing up from a chair using your arms (e.g., wheelchair or bedside chair)?: A Little Help needed to walk in hospital room?: A Little Help needed climbing 3-5 steps with a railing? : A Lot 6 Click Score: 19    End of Session Equipment Utilized During Treatment: (none) Activity Tolerance: Patient tolerated treatment well Patient left: in bed;with call bell/phone within reach;with family/visitor present Nurse Communication: Mobility status PT Visit Diagnosis: Muscle weakness (generalized) (M62.81) Pain - Right/Left: Right Pain - part of body: Ankle and joints of foot     Time: [id number] PT Time Calculation (min) (ACUTE ONLY): 19 min  Charges:                        Zenaida Niece, PT, DPT Acute Rehabilitation Pager: [phone]    Zenaida Niece 02/03/2019, 3:36 PM

## 2019-02-03 NOTE — Anesthesia Procedure Notes (Signed)
Procedure Name: [place] Performed by: Milford Cage, CRNA Pre-anesthesia Checklist: Patient identified, Suction available, Emergency Drugs available, Patient being monitored and Timeout performed Patient Re-evaluated:Patient Re-evaluated prior to induction Oxygen Delivery Method: Simple face mask Placement Confirmation: positive ETCO2

## 2019-02-03 NOTE — Anesthesia Preprocedure Evaluation (Signed)
Anesthesia Evaluation  Patient identified by MRN, date of birth, ID band Patient awake    Reviewed: Allergy & Precautions, NPO status , Patient's Chart, lab work & pertinent test results  Airway Mallampati: I  TM Distance: >3 FB Neck ROM: Full    Dental   Pulmonary former smoker,    Pulmonary exam normal        Cardiovascular hypertension, Pt. on medications Normal cardiovascular exam     Neuro/Psych    GI/Hepatic   Endo/Other  diabetes, Type 2  Renal/GU      Musculoskeletal   Abdominal   Peds  Hematology   Anesthesia Other Findings   Reproductive/Obstetrics                             Anesthesia Physical Anesthesia Plan  ASA: III  Anesthesia Plan: MAC   Post-op Pain Management:    Induction: Intravenous  PONV Risk Score and Plan: 1  Airway Management Planned: Nasal Cannula  Additional Equipment:   Intra-op Plan:   Post-operative Plan:   Informed Consent: I have reviewed the patients History and Physical, chart, labs and discussed the procedure including the risks, benefits and alternatives for the proposed anesthesia with the patient or authorized representative who has indicated his/her understanding and acceptance.       Plan Discussed with: CRNA and Surgeon  Anesthesia Plan Comments:         Anesthesia Quick Evaluation

## 2019-02-03 NOTE — Transfer of Care (Signed)
Immediate Anesthesia Transfer of Care Note  Patient: Robert Horn  Procedure(s) Performed: RIGHT FOOT 1ST RAY AMPUTATION (Right Foot)  Patient Location: PACU  Anesthesia Type:MAC  Level of Consciousness: awake  Airway & Oxygen Therapy: Patient Spontanous Breathing  Post-op Assessment: Report given to RN and Post -op Vital signs reviewed and stable  Post vital signs: Reviewed and stable  Last Vitals:  Vitals Value Taken Time  BP 121/61 02/03/19 1122  Temp    Pulse 43 02/03/19 1124  Resp 13 02/03/19 1124  SpO2 100 % 02/03/19 1124  Vitals shown include unvalidated device data.  Last Pain:  Vitals:   02/03/19 1038  TempSrc:   PainSc: 0-No pain      Patients Stated Pain Goal: 4 (22/02/54 2706)  Complications: No apparent anesthesia complications

## 2019-02-03 NOTE — Progress Notes (Signed)
Orthopedic Tech Progress Note Patient Details:  Robert Horn 06-05-1933 [id number]  Ortho Devices Type of Ortho Device: Postop shoe/boot Ortho Device/Splint Location: RLE Ortho Device/Splint Interventions: Ordered, Application   Post Interventions Patient Tolerated: Well Instructions Provided: Care of device   Braulio Bosch 02/03/2019, 12:49 PM

## 2019-02-03 NOTE — Progress Notes (Signed)
Inpatient Diabetes Program Recommendations  AACE/ADA: New Consensus Statement on Inpatient Glycemic Control (2015)  Target Ranges:  Prepandial:   less than 140 mg/dL      Peak postprandial:   less than 180 mg/dL (1-2 hours)      Critically ill patients:  140 - 180 mg/dL   Lab Results  Component Value Date   GLUCAP 110 (H) 02/03/2019   HGBA1C 7.0 (H) 01/31/2019    Review of Glycemic Control  Spoke with pt and wife regarding new onset DM with HgbA1C of 7.0%. Pt sees Dr. Delfina Redwood for PCP and states he will follow-up there for his diabetes control. Discussed importance of diet, exercise and stress management with controlling blood sugars. Discussed portion control and eating variety of foods along with physical activity as pt can tolerate. Discussed monitoring blood sugars several times/week.   Will order Living Well with Diabetes book.  Blood sugars well-controlled at present.   Will follow.  Thank you. Lorenda Peck, RD, LDN, CDE Inpatient Diabetes Coordinator [phone]

## 2019-02-04 ENCOUNTER — Encounter (HOSPITAL_COMMUNITY): Payer: Self-pay | Admitting: Orthopedic Surgery

## 2019-02-04 LAB — COMPREHENSIVE METABOLIC PANEL
ALT: 31 U/L (ref 0–44)
AST: 24 U/L (ref 15–41)
Albumin: 2.2 g/dL — ABNORMAL LOW (ref 3.5–5.0)
Alkaline Phosphatase: 121 U/L (ref 38–126)
Anion gap: 7 (ref 5–15)
BUN: 16 mg/dL (ref 8–23)
CO2: 23 mmol/L (ref 22–32)
Calcium: 8.5 mg/dL — ABNORMAL LOW (ref 8.9–10.3)
Chloride: 105 mmol/L (ref 98–111)
Creatinine, Ser: 1.45 mg/dL — ABNORMAL HIGH (ref 0.61–1.24)
GFR calc Af Amer: 51 mL/min — ABNORMAL LOW (ref >60)
GFR calc non Af Amer: 44 mL/min — ABNORMAL LOW (ref >60)
Glucose, Bld: 158 mg/dL — ABNORMAL HIGH (ref 70–99)
Potassium: 4.4 mmol/L (ref 3.5–5.1)
Sodium: 135 mmol/L (ref 135–145)
Total Bilirubin: 0.4 mg/dL (ref 0.3–1.2)
Total Protein: 6.5 g/dL (ref 6.5–8.1)

## 2019-02-04 LAB — GLUCOSE, CAPILLARY
Glucose-Capillary: 107 mg/dL — ABNORMAL HIGH (ref 70–99)
Glucose-Capillary: 159 mg/dL — ABNORMAL HIGH (ref 70–99)
Glucose-Capillary: 128 mg/dL — ABNORMAL HIGH (ref 70–99)

## 2019-02-04 LAB — MAGNESIUM: Magnesium: 2.3 mg/dL (ref 1.7–2.4)

## 2019-02-04 LAB — CBC
HCT: 28.7 % — ABNORMAL LOW (ref 39.0–52.0)
Hemoglobin: 9 g/dL — ABNORMAL LOW (ref 13.0–17.0)
MCH: 28.8 pg (ref 26.0–34.0)
MCHC: 31.4 g/dL (ref 30.0–36.0)
MCV: 92 fL (ref 80.0–100.0)
Platelets: 307 10*3/uL (ref 150–400)
RBC: 3.12 MIL/uL — ABNORMAL LOW (ref 4.22–5.81)
RDW: 13.2 % (ref 11.5–15.5)
WBC: 12.9 10*3/uL — ABNORMAL HIGH (ref 4.0–10.5)
nRBC: 0 % (ref 0.0–0.2)

## 2019-02-04 LAB — CULTURE, BLOOD (ROUTINE X 2)
Culture: NO GROWTH
Culture: NO GROWTH
Special Requests: ADEQUATE

## 2019-02-04 LAB — PHOSPHORUS: Phosphorus: 3.8 mg/dL (ref 2.5–4.6)

## 2019-02-04 MED ORDER — SODIUM CHLORIDE 0.9 % IV SOLN
3.0000 g | Freq: Four times a day (QID) | INTRAVENOUS | Status: DC
  Administered 2019-02-04 – 2019-02-10 (×24): 3 g via INTRAVENOUS
  Filled 2019-02-04: qty 8
  Filled 2019-02-04 (×2): qty 3
  Filled 2019-02-04: qty 8
  Filled 2019-02-04: qty 3
  Filled 2019-02-04: qty 8
  Filled 2019-02-04 (×11): qty 3
  Filled 2019-02-04: qty 8
  Filled 2019-02-04 (×2): qty 3
  Filled 2019-02-04: qty 8
  Filled 2019-02-04: qty 3
  Filled 2019-02-04: qty 8
  Filled 2019-02-04 (×4): qty 3
  Filled 2019-02-04: qty 8
  Filled 2019-02-04 (×2): qty 3

## 2019-02-04 NOTE — Progress Notes (Signed)
Patient ID: Robert Horn, male   DOB: 06-05-1933, 85 y.o.   MRN: [id number] Patient is postoperative day 1 right great toe first ray amputation.  The wound VAC is clogged.  The wound VAC was removed there was clot beneath the dressing with some mild skin maceration.  A dry dressing was applied.  We will start dry dressing changes daily discussed the importance of elevation and strict nonweightbearing on the right.  Discussed the patient will be out of work for a minimum of 2 months.

## 2019-02-04 NOTE — Evaluation (Signed)
Occupational Therapy Evaluation Patient Details Name: Robert Horn MRN: [id number] DOB: [birth year] Today's Date: 02/04/2019    History of Present Illness Robert Horn is a 85 y.o. male with medical history significant of hypertension, hyperlipidemia, combined systolic and diastolic congestive heart failure with ejection fraction of 40 to 45%, severe aortic stenosis s/p TAVR, colon mass s/p laparoscopic partial colectomy on 12/15/2017 presents to emergency department due to worsening right foot pain, ulcer, swelling and redness since 10 days.  Pt underwentRLE revascularization on 11/4 followed by R 1st ray amputation on 11/6, now NWB RLE.   Clinical Impression   Pt PTA: Living home with family and pt reports independence with ADL and mobility. Pt limited by poor activity tolerance, pain and increased assistance required for ADL. Pt currently performing ADL functional mobility with RW hopping a few times, but unsafely due to NWB RLE. Pt set-upA to minA for UB ADL and modA overall for LB ADL especially in standing.  Pt would benefit from continued OT skilled services for ADL, mobility and safety. OT following acutely.      Follow Up Recommendations  Home health OT;Supervision/Assistance - 24 hour(initially)    Equipment Recommendations  3 in 1 bedside commode    Recommendations for Other Services       Precautions / Restrictions Precautions Precautions: Fall Restrictions Weight Bearing Restrictions: No RLE Weight Bearing: Non weight bearing      Mobility Bed Mobility Overal bed mobility: Independent             General bed mobility comments: Able to get to EOB and into bed without issues.  Transfers Overall transfer level: Needs assistance Equipment used: Rolling walker (2 wheeled) Transfers: Sit to/from Stand Sit to Stand: Min assist         General transfer comment: Pt continues to rock for momentum. Pt left in bed due to dressing changes required for RLE due to  excessive bleeding- RN aware.    Balance Overall balance assessment: Independent Sitting-balance support: Feet unsupported;Single extremity supported Sitting balance-Leahy Scale: Good     Standing balance support: Bilateral upper extremity supported Standing balance-Leahy Scale: Good Standing balance comment: close supervision                           ADL either performed or assessed with clinical judgement   ADL Overall ADL's : Needs assistance/impaired Eating/Feeding: Modified independent;Sitting   Grooming: Modified independent;Sitting   Upper Body Bathing: Modified independent;Sitting   Lower Body Bathing: Moderate assistance;Sitting/lateral leans;Sit to/from stand   Upper Body Dressing : Modified independent;Sitting   Lower Body Dressing: Moderate assistance;Sitting/lateral leans;Sit to/from stand   Toilet Transfer: Moderate assistance;Stand-pivot;Cueing for safety;BSC;RW   Toileting- Clothing Manipulation and Hygiene: Moderate assistance;Cueing for safety;Sitting/lateral lean;Sit to/from stand       Functional mobility during ADLs: Moderate assistance;Rolling walker;Cueing for safety;Cueing for sequencing General ADL Comments: Pt limited by activity tolerance, decreased mobility and increased assist required for ADL.     Vision Baseline Vision/History: Wears glasses Wears Glasses: Reading only Vision Assessment?: No apparent visual deficits     Perception     Praxis      Pertinent Vitals/Pain Pain Assessment: Faces Faces Pain Scale: Hurts little more Pain Location: right foot Pain Descriptors / Indicators: Grimacing;Guarding Pain Intervention(s): Monitored during session     Hand Dominance Right   Extremity/Trunk Assessment Upper Extremity Assessment Upper Extremity Assessment: Overall WFL for tasks assessed   Lower Extremity Assessment Lower Extremity Assessment: Defer  to PT evaluation;RLE deficits/detail RLE Deficits / Details: s/p R  great toe amputation   Cervical / Trunk Assessment Cervical / Trunk Assessment: Normal   Communication Communication Communication: No difficulties   Cognition Arousal/Alertness: Awake/alert Behavior During Therapy: WFL for tasks assessed/performed Overall Cognitive Status: Impaired/Different from baseline Area of Impairment: Safety/judgement                         Safety/Judgement: Decreased awareness of safety     General Comments: Pt aware of NWB status, but continued to attempt to hop with LLE and place RLE heel on ground to assist when asked to scoot LLE toward HOB in standing with RLE in air. Pt reporting pain and OTR asked pt to return to sitting as pt unable to perform mobility abiding by NWB status.   General Comments  Low HR 45 BPM at rest. Otherwise, stable VSs.    Exercises     Shoulder Instructions      Home Living Family/patient expects to be discharged to:: Private residence Living Arrangements: Spouse/significant other Available Help at Discharge: Family;Available PRN/intermittently Type of Home: House Home Access: Stairs to enter [employer] of Steps: 3 Entrance Stairs-Rails: None Home Layout: Multi-level;Bed/bath upstairs;1/2 bath on main level Alternate Level Stairs-Number of Steps: 8 Alternate Level Stairs-Rails: Right Bathroom Shower/Tub: Occupational psychologist: Standard     Home Equipment: Cane - single point;Crutches          Prior Functioning/Environment Level of Independence: Independent                 OT Problem List: Decreased activity tolerance;Impaired balance (sitting and/or standing);Decreased safety awareness;Pain      OT Treatment/Interventions: Self-care/ADL training;Therapeutic exercise;Energy conservation;Therapeutic activities;Patient/family education;Balance training    OT Goals(Current goals can be found in the care plan section) Acute Rehab OT Goals Patient Stated Goal: to go  home OT Goal Formulation: With patient Time For Goal Achievement: 02/18/19 Potential to Achieve Goals: Good ADL Goals Pt Will Perform Grooming: with min guard assist;standing Pt Will Perform Lower Body Dressing: with min guard assist;sitting/lateral leans;sit to/from stand Pt Will Transfer to Toilet: with min guard assist;squat pivot transfer;bedside commode Pt/caregiver will Perform Home Exercise Program: Increased strength;With theraband;Independently Additional ADL Goal #1: Pt will increase to supervisionA overall for ADL with good safety awareness and no verbal cues for NWB status on RLE.  OT Frequency: Min 2X/week   Barriers to D/C:            Co-evaluation              AM-PAC OT "6 Clicks" Daily Activity     Outcome Measure Help from another person eating meals?: None Help from another person taking care of personal grooming?: A Little Help from another person toileting, which includes using toliet, bedpan, or urinal?: A Little Help from another person bathing (including washing, rinsing, drying)?: A Lot Help from another person to put on and taking off regular upper body clothing?: None Help from another person to put on and taking off regular lower body clothing?: A Lot 6 Click Score: 18   End of Session Equipment Utilized During Treatment: Gait belt;Rolling walker Nurse Communication: Mobility status  Activity Tolerance: Patient limited by pain Patient left: in bed;with call bell/phone within reach;with bed alarm set  OT Visit Diagnosis: Unsteadiness on feet (R26.81);Muscle weakness (generalized) (M62.81)                Time: [id number]  OT Time Calculation (min): 16 min Charges:  OT General Charges $OT Visit: 1 Visit OT Evaluation $OT Eval Moderate Complexity: 1 Mod  Darryl Nestle) Marsa Aris OTR/L Acute Rehabilitation Services Pager: [phone] Office: [phone]   Audie Pinto 02/04/2019, 1:30 PM

## 2019-02-04 NOTE — Progress Notes (Signed)
Pt assisted to bedside commode by wife. Upon entering the room, pt's right foot dressing was completely saturated with blood and was dripping on the floor. Pt followed the NWB restriction, however, he did not have his foot elevated. Pt was assisted back to bed and a new dressing was applied. Right foot is elevated and pt was instructed to keep it elevated. Will continue to monitor.

## 2019-02-04 NOTE — Progress Notes (Signed)
PROGRESS NOTE    Robert Horn  [id number] DOB: 08/20/1933 DOA: 01/30/2019 PCP: [name], MD   Brief Narrative:  85 y.o.malewith medical history significant ofhypertension, hyperlipidemia, heart failure with reduced ejection fraction (last echocardiogram with ejection fraction 40 to 45%),   severe aortic stenosis s/p TAVR, colon mass s/p laparoscopic partial colectomy on 12/15/2017 presents to emergency department due to worsening right foot pain, ulcer, swelling and redness since 10 days.  Failed outpatient Keflex treatment.  Found to have right foot necrotic wound for which orthopedic and vascular surgery were consulted.  He is status post right lower extremity revascularization by vascular surgery on 02/01/2019.  He is status post first ray amputation of right foot by orthopedics on 02/03/2019. Wound VAC was removed today due to blood clot underneath and daily dry dressing as recommended by orthopedics. Patient requested for excuse duty for 2 months.  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  Will need excuse duty to submit to the court. Discussed with orthopedics about the issuance of excuse duty to the patient.   Assessment & Plan:   Principal Problem:   Cellulitis of right foot Active Problems:   Essential hypertension   Hyperlipidemia   Chronic combined systolic and diastolic heart failure (HCC)   Severe aortic stenosis   S/P TAVR (transcatheter aortic valve replacement)   CKD (chronic kidney disease), stage III   Elevated LFTs   Colon cancer (HCC)   Anemia of chronic disease   Osteomyelitis of great toe of right foot (HCC)   Severe protein-calorie malnutrition (HCC)   Diabetic polyneuropathy associated with type 2 diabetes mellitus (HCC)   Cutaneous abscess of right foot  Right foot necrotic wound with surrounding cellulitis Peripheral vascular disease Vascular surgery and orthopedic is following. Dr. Sharol Given  plan for procedure tomorrow.  Continue with  IV vancomycin and Zosyn. Status post  arteriogram right lower extremity with revascularization by Vascular surgery. He is status post first ray amputation of right foot tomorrow on  02/03/2019.   C/w aspirin and statin  Diabetes mellitus type 2, newly diagnosed.  Hemoglobin A1c 7.0 Continue with sliding scale insulin Fingersticks before meals and at bedtime Hypoglycemic protocol   Heart failure with reduced ejection fraction.  Last echocardiogram showed ejection fraction of 40%.  Appears stable and not in acute exacerbation. Continue with guideline directed medical therapy with Entresto, Coreg and high intensity statin. Low-sodium diet 2 g Water restriction to 1500 cc daily Daily weight  CKD stage IIIa.  Serum creatinine at baseline of 1.3-1.6. Continue to monitor renal function Avoid nephrotoxic agents.   Essential hypertension Continue with -Norvasc 10 mg daily, Coreg 3.125 mg twice daily  Anemia of chronic disease and iron deficiency Continue with iron supplementation and vitamin C.     History of severe aortic stenosis status post TAVR History of adenocarcinoma of the colon status post partial colectomy in 2019.  Stable.  No intervention at this time.  DVT prophylaxis: Lovenox Code Status: Full code Family Communication: Patients wife at bedside.                                               Disposition Plan: Orthopedics planning for surgical intervention today.  Consultants:   Vascular  Orthopedic  Procedures: Right lower extremity angiogram with angioplasty/stent  Antimicrobials:   Unasyn 3 g Q 6 hrly   Subjective: Patient is sitting out of bed, not in acute distress Just signed consent for his surgical intervention tomorrow by ortho.  Review of Systems Otherwise negative except as per HPI, including: General: Denies fever, chills, night sweats or unintended weight loss. Resp: Denies cough, wheezing,  shortness of breath. Cardiac: Denies chest pain, palpitations, orthopnea, paroxysmal nocturnal dyspnea. GI: Denies abdominal pain, nausea, vomiting, diarrhea or constipation GU: Denies dysuria, frequency, hesitancy or incontinence MS: Denies muscle aches, joint pain or swelling Neuro: Denies headache, neurologic deficits (focal weakness, numbness, tingling), abnormal gait Psych: Denies anxiety or depression. Skin: Denies new rashes or lesions  Objective: Vitals:   02/04/19 0000 02/04/19 0400 02/04/19 1001 02/04/19 1100  BP: 123/62 (!) 132/59 133/76 127/64  Pulse: (!) 48 (!) 41 (!) 47 (!) 45  Resp: 15 10 14 15   Temp: 98.1 F (36.7 C) 98.4 F (36.9 C)  98 F (36.7 C)  TempSrc: Oral Oral  Oral  SpO2: 98% 100% 100% 100%  Weight:      Height:        Intake/Output Summary (Last 24 hours) at 02/04/2019 1346 Last data filed at 02/04/2019 1105 Gross per 24 hour  Intake 240 ml  Output 500 ml  Net -260 ml   Filed Weights   02/02/19 0408 02/03/19 0450 02/03/19 1037  Weight: 100.7 kg 99.5 kg 99.5 kg    Examination:  General exam: Appears calm and comfortable  Respiratory system: Clear to auscultation. Respiratory effort normal. Cardiovascular system: S1 & S2 heard, RRR. No JVD, murmurs, rubs, gallops or clicks. No pedal edema. Gastrointestinal system: Abdomen is nondistended, soft and nontender. No organomegaly or masses felt. Normal bowel sounds heard. Central nervous system: Alert and oriented. No focal neurological deficits. Extremities: Symmetric 5 x 5 power. Skin: Right lower extremity dressing in place. Psychiatry: Judgement and insight appear normal. Mood & affect appropriate.     Data Reviewed:   CBC: Recent Labs  Lab 01/30/19 1433 01/30/19 2356 01/31/19 0324 02/01/19 0159 02/03/19 0222 02/04/19 0236  WBC 11.5* 10.6* 10.3 8.8 8.9 12.9*  NEUTROABS 9.0*  --   --   --   --   --   HGB 11.4* 10.3* 10.3* 10.6* 9.8* 9.0*  HCT 36.7* 32.2* 31.8* 32.9* 31.2* 28.7*  MCV  93.6 91.0 91.1 89.9 92.3 92.0  PLT 238 PLATELET CLUMPS NOTED ON SMEAR, UNABLE TO ESTIMATE 185 217 274 [id number]   Basic Metabolic Panel: Recent Labs  Lab 01/30/19 1433 01/30/19 2356 01/31/19 0324 02/01/19 0159 02/03/19 0222 02/04/19 0236  NA 135  --  138 140 138 135  K 4.3  --  4.4 4.5 4.1 4.4  CL 101  --  108 108 107 105  CO2 22  --  21* 22 23 23   GLUCOSE 169*  --  120* 111* 119* 158*  BUN 27*  --  27* 24* 16 16  CREATININE 1.58* 1.52* 1.39* 1.40* 1.32* 1.45*  CALCIUM 9.0  --  8.7* 8.8* 8.5* 8.5*  MG  --   --   --   --  2.2 2.3  PHOS  --   --   --   --  3.5 3.8   GFR: Estimated Creatinine Clearance: 47.2 mL/min (A) (by C-G formula based on SCr of 1.45 mg/dL (H)). Liver Function Tests: Recent Labs  Lab 01/30/19 1433 01/31/19 0324 02/04/19 0236  AST 87* 69* 24  ALT 71* 65* 31  ALKPHOS 201* 170* 121  BILITOT 0.9 0.6 0.4  PROT 8.0 6.5 6.5  ALBUMIN 2.7* 2.3* 2.2*   No results for input(s): LIPASE, AMYLASE in the last 168 hours. No results for input(s): AMMONIA in the last 168 hours. Coagulation Profile: Recent Labs  Lab 02/01/19 0159  INR 1.1   Cardiac Enzymes: No results for input(s): CKTOTAL, CKMB, CKMBINDEX, TROPONINI in the last 168 hours. BNP (last 3 results) Recent Labs    11/24/18 0849  PROBNP 294   HbA1C: No results for input(s): HGBA1C in the last 72 hours. CBG: Recent Labs  Lab 02/03/19 1124 02/03/19 1226 02/03/19 1635 02/04/19 0619 02/04/19 1102  GLUCAP 112* 110* 183* 107* 159*   Lipid Profile: No results for input(s): CHOL, HDL, LDLCALC, TRIG, CHOLHDL, LDLDIRECT in the last 72 hours. Thyroid Function Tests: No results for input(s): TSH, T4TOTAL, FREET4, T3FREE, THYROIDAB in the last 72 hours. Anemia Panel: No results for input(s): VITAMINB12, FOLATE, FERRITIN, TIBC, IRON, RETICCTPCT in the last 72 hours. Sepsis Labs: Recent Labs  Lab 01/30/19 1433 01/30/19 1602  LATICACIDVEN 1.3 1.5    Recent Results (from the past 240 hour(s))   Culture, blood (Routine x 2)     Status: None   Collection Time: 01/26/19  7:30 PM   Specimen: BLOOD RIGHT ARM  Result Value Ref Range Status   Specimen Description BLOOD RIGHT ARM  Final   Special Requests   Final    BOTTLES DRAWN AEROBIC AND ANAEROBIC Blood Culture adequate volume   Culture   Final    NO GROWTH 5 DAYS Performed at [facility], [address], [place], [place] [address]    Report Status 01/31/2019 FINAL  Final  Culture, blood (Routine x 2)     Status: None   Collection Time: 01/26/19  7:40 PM   Specimen: BLOOD RIGHT HAND  Result Value Ref Range Status   Specimen Description BLOOD RIGHT HAND  Final   Special Requests   Final    BOTTLES DRAWN AEROBIC ONLY Blood Culture adequate volume   Culture   Final    NO GROWTH 5 DAYS Performed at [facility], [place] [address], [place], [place] [address]    Report Status 01/31/2019 FINAL  Final  Blood culture (routine x 2)     Status: None   Collection Time: 01/30/19  2:49 PM   Specimen: BLOOD  Result Value Ref Range Status   Specimen Description BLOOD LEFT ANTECUBITAL  Final   Special Requests   Final    BOTTLES DRAWN AEROBIC AND ANAEROBIC Blood Culture results may not be optimal due to an inadequate volume of blood received in culture bottles   Culture   Final    NO GROWTH 5 DAYS Performed at [facility], [place] [address] [place], [place] [address]    Report Status 02/04/2019 FINAL  Final  Blood culture (routine x 2)     Status: None   Collection Time: 01/30/19  3:37 PM   Specimen: BLOOD  Result Value Ref Range Status   Specimen Description BLOOD RIGHT ANTECUBITAL  Final   Special Requests   Final    BOTTLES DRAWN AEROBIC AND ANAEROBIC Blood Culture adequate volume   Culture   Final    NO GROWTH 5 DAYS Performed at [facility], [place] [address], [place], [place] [address]    Report Status 02/04/2019 FINAL  Final  SARS CORONAVIRUS 2 (TAT 6-24 HRS) Nasopharyngeal Nasopharyngeal Swab      Status: None  Collection Time: 01/30/19  3:50 PM   Specimen: Nasopharyngeal Swab  Result Value Ref Range Status   SARS Coronavirus 2 NEGATIVE NEGATIVE Final    Comment: (NOTE) SARS-CoV-2 target nucleic acids are NOT DETECTED. The SARS-CoV-2 RNA is generally detectable in upper and lower respiratory specimens during the acute phase of infection. Negative results do not preclude SARS-CoV-2 infection, do not rule out co-infections with other pathogens, and should not be used as the sole basis for treatment or other patient management decisions. Negative results must be combined with clinical observations, patient history, and epidemiological information. The expected result is Negative. Fact Sheet for Patients: SugarRoll.be Fact Sheet for Healthcare Providers: https://www.woods-mathews.com/ This test is not yet approved or cleared by the [place] FDA and  has been authorized for detection and/or diagnosis of SARS-CoV-2 by FDA under an Emergency Use Authorization (EUA). This EUA will remain  in effect (meaning this test can be used) for the duration of the COVID-19 declaration under Section 56 4(b)(1) of the Act, 21 U.S.C. section 360bbb-3(b)(1), unless the authorization is terminated or revoked sooner. Performed at [facility], [place] [address], [place], [place] [address]   MRSA PCR Screening     Status: None   Collection Time: 02/02/19  6:53 PM   Specimen: Nasal Mucosa; Nasopharyngeal  Result Value Ref Range Status   MRSA by PCR NEGATIVE NEGATIVE Final    Comment:        The GeneXpert MRSA Assay (FDA approved for NASAL specimens only), is one component of a comprehensive MRSA colonization surveillance program. It is not intended to diagnose MRSA infection nor to guide or monitor treatment for MRSA infections. Performed at [facility], [place] [address] [place], [place] [address]          Radiology Studies: No  results found.      Scheduled Meds: . amLODipine  10 mg Oral Daily  . aspirin EC  81 mg Oral Daily  . carvedilol  3.125 mg Oral BID WC  . clopidogrel  75 mg Oral Q breakfast  . docusate sodium  100 mg Oral BID  . enoxaparin (LOVENOX) injection  40 mg Subcutaneous Q24H  . ferrous sulfate  325 mg Oral Q breakfast  . furosemide  60 mg Oral Daily  . gabapentin  300 mg Oral TID  . rosuvastatin  10 mg Oral Daily  . sacubitril-valsartan  1 tablet Oral BID  . sodium chloride flush  3 mL Intravenous Once  . sodium chloride flush  3 mL Intravenous Q12H            LOS: 5 days   Total time spent for this encounter is 25 mins    Elie Confer, MD [facility] Pager: [phone]  If 7PM-7AM, please contact night-coverage  02/04/2019, 1:46 PM

## 2019-02-05 ENCOUNTER — Encounter: Payer: Self-pay | Admitting: Internal Medicine

## 2019-02-05 ENCOUNTER — Encounter (HOSPITAL_COMMUNITY): Payer: Self-pay | Admitting: *Deleted

## 2019-02-05 LAB — CBC
HCT: 27.7 % — ABNORMAL LOW (ref 39.0–52.0)
Hemoglobin: 8.7 g/dL — ABNORMAL LOW (ref 13.0–17.0)
MCH: 29.5 pg (ref 26.0–34.0)
MCHC: 31.4 g/dL (ref 30.0–36.0)
MCV: 93.9 fL (ref 80.0–100.0)
Platelets: 304 10*3/uL (ref 150–400)
RBC: 2.95 MIL/uL — ABNORMAL LOW (ref 4.22–5.81)
RDW: 13.2 % (ref 11.5–15.5)
WBC: 9.3 10*3/uL (ref 4.0–10.5)
nRBC: 0 % (ref 0.0–0.2)

## 2019-02-05 LAB — COMPREHENSIVE METABOLIC PANEL
ALT: 30 U/L (ref 0–44)
AST: 27 U/L (ref 15–41)
Albumin: 2.2 g/dL — ABNORMAL LOW (ref 3.5–5.0)
Alkaline Phosphatase: 107 U/L (ref 38–126)
Anion gap: 6 (ref 5–15)
BUN: 17 mg/dL (ref 8–23)
CO2: 25 mmol/L (ref 22–32)
Calcium: 8.4 mg/dL — ABNORMAL LOW (ref 8.9–10.3)
Chloride: 107 mmol/L (ref 98–111)
Creatinine, Ser: 1.37 mg/dL — ABNORMAL HIGH (ref 0.61–1.24)
GFR calc Af Amer: 55 mL/min — ABNORMAL LOW (ref >60)
GFR calc non Af Amer: 47 mL/min — ABNORMAL LOW (ref >60)
Glucose, Bld: 133 mg/dL — ABNORMAL HIGH (ref 70–99)
Potassium: 4.4 mmol/L (ref 3.5–5.1)
Sodium: 138 mmol/L (ref 135–145)
Total Bilirubin: 0.2 mg/dL — ABNORMAL LOW (ref 0.3–1.2)
Total Protein: 6.8 g/dL (ref 6.5–8.1)

## 2019-02-05 LAB — GLUCOSE, CAPILLARY
Glucose-Capillary: 94 mg/dL (ref 70–99)
Glucose-Capillary: 138 mg/dL — ABNORMAL HIGH (ref 70–99)

## 2019-02-05 LAB — MAGNESIUM: Magnesium: 2.3 mg/dL (ref 1.7–2.4)

## 2019-02-05 LAB — PHOSPHORUS: Phosphorus: 3.8 mg/dL (ref 2.5–4.6)

## 2019-02-05 NOTE — Progress Notes (Signed)
Notified on-call physician, patient with +2 pauses also bradying down to upper 20s, non-sustained. Patient sleeping during events. No orders at this time, will continue to monitor.

## 2019-02-05 NOTE — Progress Notes (Signed)
[facility]  02/05/2019 Tenor Casa MRN: [id number]  Letter of excuse from work  Patient was admitted on 01/30/2019 for right toe infection. He had surgical intervention by orthopedic surgery and currently admitted in hospital for continued treatment.  He is expected to be out of work for 2 months starting from the date of surgery on 02/03/2019.  He is expected to resume work about January 2021   Thanks  Dr. Leonides Grills Pager: [phone] [facility].

## 2019-02-05 NOTE — Progress Notes (Signed)
PROGRESS NOTE    Robert Horn  [id number] DOB: Aug 07, 1933 DOA: 01/30/2019 PCP: [name], MD   Brief Narrative:  85 y.o.malewith medical history significant ofhypertension, hyperlipidemia, heart failure with reduced ejection fraction (last echocardiogram with ejection fraction 40 to 45%),   severe aortic stenosis s/p TAVR, colon mass s/p laparoscopic partial colectomy on 12/15/2017 presents to emergency department due to worsening right foot pain, ulcer, swelling and redness since 10 days.  Failed outpatient Keflex treatment.  Found to have right foot necrotic wound for which orthopedic and vascular surgery were consulted.  He is status post right lower extremity revascularization by vascular surgery on 02/01/2019.  He is status post first ray amputation of right foot by orthopedics on 02/03/2019. Wound VAC was removed today due to blood clot underneath and daily dry dressing as recommended by orthopedics. Patient requested for excuse duty for 2 months.  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  Will need excuse duty to submit to the court. Discussed with orthopedics about the issuance of excuse duty to the patient.   Assessment & Plan:   Principal Problem:   Cellulitis of right foot Active Problems:   Essential hypertension   Hyperlipidemia   Chronic combined systolic and diastolic heart failure (HCC)   Severe aortic stenosis   S/P TAVR (transcatheter aortic valve replacement)   CKD (chronic kidney disease), stage III   Elevated LFTs   Colon cancer (HCC)   Anemia of chronic disease   Osteomyelitis of great toe of right foot (HCC)   Severe protein-calorie malnutrition (HCC)   Diabetic polyneuropathy associated with type 2 diabetes mellitus (HCC)   Cutaneous abscess of right foot  Right foot necrotic wound with surrounding cellulitis Peripheral vascular disease Vascular surgery and orthopedic is following. Dr. Sharol Given  plan for procedure tomorrow.  Continue with  IV Unasyn 3 g every 6 hourly.   Status post  arteriogram right lower extremity with revascularization by Vascular surgery. He is status post first ray amputation of right foot on  02/03/2019.   C/w aspirin and statin Ortho managing -Dr. Sharol Given  Diabetes mellitus type 2, newly diagnosed.  Hemoglobin A1c 7.0 Continue with sliding scale insulin Fingersticks before meals and at bedtime Hypoglycemic protocol   Heart failure with reduced ejection fraction.  Last echocardiogram showed ejection fraction of 40%.  Appears stable and not in acute exacerbation. Continue with guideline directed medical therapy with Entresto, Coreg and high intensity statin. Low-sodium diet 2 g Water restriction to 1500 cc daily Daily weight  CKD stage IIIa.  Serum creatinine at baseline of 1.3-1.6. Continue to monitor renal function Avoid nephrotoxic agents.   Essential hypertension Continue with -Norvasc 10 mg daily, Coreg 3.125 mg twice daily  Anemia of chronic disease and iron deficiency Continue with iron supplementation and vitamin C.     History of severe aortic stenosis status post TAVR History of adenocarcinoma of the colon status post partial colectomy in 2019.  Stable.  No intervention at this time.  DVT prophylaxis: Lovenox subcute Code Status: Full code Family Communication: Patients wife at bedside.                                               Disposition Plan: Status post right first ray amputation by Ortho  Consultants:   Vascular  Orthopedic  Procedures:  Right lower extremity angiogram with angioplasty/stent  Right first ray amputation  Antimicrobials: IV Vanco and Zosyn, then discontinued  Unasyn 3 g Q 6 hrly   Subjective: Patient is sitting out of bed, not in acute distress Just signed consent for his surgical intervention tomorrow by ortho.  Review of Systems Otherwise negative except as per HPI, including: General: Denies  fever, chills, night sweats or unintended weight loss. Resp: Denies cough, wheezing, shortness of breath. Cardiac: Denies chest pain, palpitations, orthopnea, paroxysmal nocturnal dyspnea. GI: Denies abdominal pain, nausea, vomiting, diarrhea or constipation GU: Denies dysuria, frequency, hesitancy or incontinence MS: Denies muscle aches, joint pain or swelling Neuro: Denies headache, neurologic deficits (focal weakness, numbness, tingling), abnormal gait Psych: Denies anxiety or depression. Skin: Denies new rashes or lesions  Objective: Vitals:   02/05/19 0446 02/05/19 0800 02/05/19 0806 02/05/19 1110  BP: 131/66  (!) 159/67 (!) 152/87  Pulse: (!) 44   (!) 53  Resp: 13 15  16   Temp: 97.6 F (36.4 C)   97.7 F (36.5 C)  TempSrc: Oral   Oral  SpO2: 98%   100%  Weight: 104.3 kg     Height:        Intake/Output Summary (Last 24 hours) at 02/05/2019 1351 Last data filed at 02/05/2019 0949 Gross per 24 hour  Intake 0 ml  Output 1200 ml  Net -1200 ml   Filed Weights   02/03/19 0450 02/03/19 1037 02/05/19 0446  Weight: 99.5 kg 99.5 kg 104.3 kg    Examination:  General exam: Appears calm and comfortable  Respiratory system: Clear to auscultation. Respiratory effort normal. Cardiovascular system: S1 & S2 heard, RRR. No JVD, murmurs, rubs, gallops or clicks. No pedal edema. Gastrointestinal system: Abdomen is nondistended, soft and nontender. No organomegaly or masses felt. Normal bowel sounds heard. Central nervous system: Alert and oriented. No focal neurological deficits. Extremities: Symmetric 5 x 5 power. Skin: Right lower extremity dressing in place. Psychiatry: Judgement and insight appear normal. Mood & affect appropriate.     Data Reviewed:   CBC: Recent Labs  Lab 01/30/19 1433  01/31/19 0324 02/01/19 0159 02/03/19 0222 02/04/19 0236 02/05/19 0233  WBC 11.5*   < > 10.3 8.8 8.9 12.9* 9.3  NEUTROABS 9.0*  --   --   --   --   --   --   HGB 11.4*   < > 10.3*  10.6* 9.8* 9.0* 8.7*  HCT 36.7*   < > 31.8* 32.9* 31.2* 28.7* 27.7*  MCV 93.6   < > 91.1 89.9 92.3 92.0 93.9  PLT 238   < > 185 217 274 307 304   < > = values in this interval not displayed.   Basic Metabolic Panel: Recent Labs  Lab 01/31/19 0324 02/01/19 0159 02/03/19 0222 02/04/19 0236 02/05/19 0233  NA 138 140 138 135 138  K 4.4 4.5 4.1 4.4 4.4  CL 108 108 107 105 107  CO2 21* 22 23 23 25   GLUCOSE 120* 111* 119* 158* 133*  BUN 27* 24* 16 16 17   CREATININE 1.39* 1.40* 1.32* 1.45* 1.37*  CALCIUM 8.7* 8.8* 8.5* 8.5* 8.4*  MG  --   --  2.2 2.3 2.3  PHOS  --   --  3.5 3.8 3.8   GFR: Estimated Creatinine Clearance: 51 mL/min (A) (by C-G formula based on SCr of 1.37 mg/dL (H)). Liver Function Tests: Recent Labs  Lab 01/30/19 1433 01/31/19 0324 02/04/19 0236 02/05/19 [id number]  AST 87* 69* 24 27  ALT 71* 65* 31 30  ALKPHOS 201* 170* 121 107  BILITOT 0.9 0.6 0.4 0.2*  PROT 8.0 6.5 6.5 6.8  ALBUMIN 2.7* 2.3* 2.2* 2.2*   No results for input(s): LIPASE, AMYLASE in the last 168 hours. No results for input(s): AMMONIA in the last 168 hours. Coagulation Profile: Recent Labs  Lab 02/01/19 0159  INR 1.1   Cardiac Enzymes: No results for input(s): CKTOTAL, CKMB, CKMBINDEX, TROPONINI in the last 168 hours. BNP (last 3 results) Recent Labs    11/24/18 0849  PROBNP 294   HbA1C: No results for input(s): HGBA1C in the last 72 hours. CBG: Recent Labs  Lab 02/03/19 1635 02/04/19 0619 02/04/19 1102 02/04/19 1606 02/05/19 1106  GLUCAP 183* 107* 159* 128* 94   Lipid Profile: No results for input(s): CHOL, HDL, LDLCALC, TRIG, CHOLHDL, LDLDIRECT in the last 72 hours. Thyroid Function Tests: No results for input(s): TSH, T4TOTAL, FREET4, T3FREE, THYROIDAB in the last 72 hours. Anemia Panel: No results for input(s): VITAMINB12, FOLATE, FERRITIN, TIBC, IRON, RETICCTPCT in the last 72 hours. Sepsis Labs: Recent Labs  Lab 01/30/19 1433 01/30/19 1602  LATICACIDVEN 1.3 1.5     Recent Results (from the past 240 hour(s))  Culture, blood (Routine x 2)     Status: None   Collection Time: 01/26/19  7:30 PM   Specimen: BLOOD RIGHT ARM  Result Value Ref Range Status   Specimen Description BLOOD RIGHT ARM  Final   Special Requests   Final    BOTTLES DRAWN AEROBIC AND ANAEROBIC Blood Culture adequate volume   Culture   Final    NO GROWTH 5 DAYS Performed at [facility], [address], [place], [place] [address]    Report Status 01/31/2019 FINAL  Final  Culture, blood (Routine x 2)     Status: None   Collection Time: 01/26/19  7:40 PM   Specimen: BLOOD RIGHT HAND  Result Value Ref Range Status   Specimen Description BLOOD RIGHT HAND  Final   Special Requests   Final    BOTTLES DRAWN AEROBIC ONLY Blood Culture adequate volume   Culture   Final    NO GROWTH 5 DAYS Performed at [facility], [place] [address] [place], [place] [address]    Report Status 01/31/2019 FINAL  Final  Blood culture (routine x 2)     Status: None   Collection Time: 01/30/19  2:49 PM   Specimen: BLOOD  Result Value Ref Range Status   Specimen Description BLOOD LEFT ANTECUBITAL  Final   Special Requests   Final    BOTTLES DRAWN AEROBIC AND ANAEROBIC Blood Culture results may not be optimal due to an inadequate volume of blood received in culture bottles   Culture   Final    NO GROWTH 5 DAYS Performed at [facility], [place] [address], [place], [place] [address]    Report Status 02/04/2019 FINAL  Final  Blood culture (routine x 2)     Status: None   Collection Time: 01/30/19  3:37 PM   Specimen: BLOOD  Result Value Ref Range Status   Specimen Description BLOOD RIGHT ANTECUBITAL  Final   Special Requests   Final    BOTTLES DRAWN AEROBIC AND ANAEROBIC Blood Culture adequate volume   Culture   Final    NO GROWTH 5 DAYS Performed at [facility], [place] [address], [place], [place] [address]    Report Status 02/04/2019 FINAL  Final  SARS CORONAVIRUS 2 (TAT 6-24  HRS) Nasopharyngeal Nasopharyngeal Swab     Status: None   Collection Time: 01/30/19  3:50 PM   Specimen: Nasopharyngeal Swab  Result Value Ref Range Status   SARS Coronavirus 2 NEGATIVE NEGATIVE Final    Comment: (NOTE) SARS-CoV-2 target nucleic acids are NOT DETECTED. The SARS-CoV-2 RNA is generally detectable in upper and lower respiratory specimens during the acute phase of infection. Negative results do not preclude SARS-CoV-2 infection, do not rule out co-infections with other pathogens, and should not be used as the sole basis for treatment or other patient management decisions. Negative results must be combined with clinical observations, patient history, and epidemiological information. The expected result is Negative. Fact Sheet for Patients: SugarRoll.be Fact Sheet for Healthcare Providers: https://www.woods-mathews.com/ This test is not yet approved or cleared by the [place] FDA and  has been authorized for detection and/or diagnosis of SARS-CoV-2 by FDA under an Emergency Use Authorization (EUA). This EUA will remain  in effect (meaning this test can be used) for the duration of the COVID-19 declaration under Section 56 4(b)(1) of the Act, 21 U.S.C. section 360bbb-3(b)(1), unless the authorization is terminated or revoked sooner. Performed at [facility], [place] [address], [place], [place] [address]   MRSA PCR Screening     Status: None   Collection Time: 02/02/19  6:53 PM   Specimen: Nasal Mucosa; Nasopharyngeal  Result Value Ref Range Status   MRSA by PCR NEGATIVE NEGATIVE Final    Comment:        The GeneXpert MRSA Assay (FDA approved for NASAL specimens only), is one component of a comprehensive MRSA colonization surveillance program. It is not intended to diagnose MRSA infection nor to guide or monitor treatment for MRSA infections. Performed at [facility], [place] [address] [place], [place]  [address]          Radiology Studies: No results found.      Scheduled Meds: . amLODipine  10 mg Oral Daily  . aspirin EC  81 mg Oral Daily  . carvedilol  3.125 mg Oral BID WC  . clopidogrel  75 mg Oral Q breakfast  . docusate sodium  100 mg Oral BID  . enoxaparin (LOVENOX) injection  40 mg Subcutaneous Q24H  . ferrous sulfate  325 mg Oral Q breakfast  . furosemide  60 mg Oral Daily  . gabapentin  300 mg Oral TID  . rosuvastatin  10 mg Oral Daily  . sacubitril-valsartan  1 tablet Oral BID  . sodium chloride flush  3 mL Intravenous Once  . sodium chloride flush  3 mL Intravenous Q12H            LOS: 6 days   Total time spent for this encounter is 25 mins    Elie Confer, MD [facility] Pager: [phone]  If 7PM-7AM, please contact night-coverage  02/05/2019, 1:51 PM

## 2019-02-06 LAB — COMPREHENSIVE METABOLIC PANEL
ALT: 28 U/L (ref 0–44)
AST: 23 U/L (ref 15–41)
Albumin: 2.3 g/dL — ABNORMAL LOW (ref 3.5–5.0)
Alkaline Phosphatase: 106 U/L (ref 38–126)
Anion gap: 6 (ref 5–15)
BUN: 17 mg/dL (ref 8–23)
CO2: 27 mmol/L (ref 22–32)
Calcium: 8.7 mg/dL — ABNORMAL LOW (ref 8.9–10.3)
Chloride: 105 mmol/L (ref 98–111)
Creatinine, Ser: 1.32 mg/dL — ABNORMAL HIGH (ref 0.61–1.24)
GFR calc Af Amer: 57 mL/min — ABNORMAL LOW (ref >60)
GFR calc non Af Amer: 50 mL/min — ABNORMAL LOW (ref >60)
Glucose, Bld: 112 mg/dL — ABNORMAL HIGH (ref 70–99)
Potassium: 4.9 mmol/L (ref 3.5–5.1)
Sodium: 138 mmol/L (ref 135–145)
Total Bilirubin: 0.7 mg/dL (ref 0.3–1.2)
Total Protein: 6.9 g/dL (ref 6.5–8.1)

## 2019-02-06 LAB — CBC
HCT: 28 % — ABNORMAL LOW (ref 39.0–52.0)
Hemoglobin: 8.8 g/dL — ABNORMAL LOW (ref 13.0–17.0)
MCH: 29.6 pg (ref 26.0–34.0)
MCHC: 31.4 g/dL (ref 30.0–36.0)
MCV: 94.3 fL (ref 80.0–100.0)
Platelets: 324 10*3/uL (ref 150–400)
RBC: 2.97 MIL/uL — ABNORMAL LOW (ref 4.22–5.81)
RDW: 13.3 % (ref 11.5–15.5)
WBC: 9.6 10*3/uL (ref 4.0–10.5)
nRBC: 0 % (ref 0.0–0.2)

## 2019-02-06 LAB — PHOSPHORUS: Phosphorus: 3.9 mg/dL (ref 2.5–4.6)

## 2019-02-06 LAB — GLUCOSE, CAPILLARY: Glucose-Capillary: 103 mg/dL — ABNORMAL HIGH (ref 70–99)

## 2019-02-06 LAB — MAGNESIUM: Magnesium: 2.1 mg/dL (ref 1.7–2.4)

## 2019-02-06 NOTE — Progress Notes (Signed)
PROGRESS NOTE    Robert Horn  [id number] DOB: 1933-04-27 DOA: 01/30/2019 PCP: [name], MD   Brief Narrative:  85 y.o.malewith medical history significant ofhypertension, hyperlipidemia, heart failure with reduced ejection fraction (last echocardiogram with ejection fraction 40 to 45%),   severe aortic stenosis s/p TAVR, colon mass s/p laparoscopic partial colectomy on 12/15/2017 presents to emergency department due to worsening right foot pain, ulcer, swelling and redness since 10 days.  Failed outpatient Keflex treatment.  Found to have right foot necrotic wound for which orthopedic and vascular surgery were consulted.  He is status post right lower extremity revascularization by vascular surgery on 02/01/2019.  He is status post first ray amputation of right foot by orthopedics on 02/03/2019.   Assessment & Plan:   Principal Problem:   Cellulitis of right foot Active Problems:   Essential hypertension   Hyperlipidemia   Chronic combined systolic and diastolic heart failure (HCC)   Severe aortic stenosis   S/P TAVR (transcatheter aortic valve replacement)   CKD (chronic kidney disease), stage III   Elevated LFTs   Colon cancer (HCC)   Anemia of chronic disease   Osteomyelitis of great toe of right foot (HCC)   Severe protein-calorie malnutrition (HCC)   Diabetic polyneuropathy associated with type 2 diabetes mellitus (HCC)   Cutaneous abscess of right foot  Right foot necrotic wound with surrounding cellulitis Peripheral vascular disease Vascular surgery and orthopedic is following  Continue with  IV Unasyn 3 g every 6 hourly.   Status post  arteriogram right lower extremity with revascularization by Vascular surgery. He is status post first ray amputation of right foot on  02/03/2019.   C/w aspirin, Plavix and statin Ortho managing -Dr. Sharol Given  Diabetes mellitus type 2, newly diagnosed.  Hemoglobin A1c 7.0 Continue with sliding scale insulin Fingersticks before  meals and at bedtime Hypoglycemic protocol   Heart failure with reduced ejection fraction.  Last echocardiogram showed ejection fraction of 40%.  Appears stable and not in acute exacerbation. Continue with guideline directed medical therapy with Entresto, Coreg and high intensity statin. Low-sodium diet 2 g Water restriction to 1500 cc daily Daily weight  CKD stage IIIa.  Serum creatinine at baseline of 1.3-1.6. Continue to monitor renal function Avoid nephrotoxic agents.   Essential hypertension Continue with -Norvasc 10 mg daily, Coreg 3.125 mg twice daily  Anemia of chronic disease and iron deficiency Continue with iron supplementation and vitamin C.     History of severe aortic stenosis status post TAVR History of adenocarcinoma of the colon status post partial colectomy in 2019.  Stable.  No intervention at this time.  DVT prophylaxis: Lovenox subcute Code Status: Full code                                            Disposition Plan: Status post right first ray amputation by Ortho  Consultants:   Vascular  Orthopedic  Procedures:  Right lower extremity angiogram with angioplasty/stent  Right first ray amputation  Antimicrobials: IV Vanco and Zosyn, then discontinued  Unasyn 3 g Q 6 hrly   Subjective: Patient denies any acute complaints and states the pain in his foot is tolerable.  He denies any fevers or chills.  Objective: Vitals:   02/05/19 1950 02/06/19 0314 02/06/19 0811 02/06/19 1019  BP: 138/66 136/70 (!) 147/60   Pulse: (!) 51 (!) 51  Resp: 13 13  13   Temp: 98.1 F (36.7 C) 98.1 F (36.7 C) 97.9 F (36.6 C)   TempSrc: Oral Oral Oral   SpO2: 100% 98%  99%  Weight:  101.9 kg    Height:        Intake/Output Summary (Last 24 hours) at 02/06/2019 1532 Last data filed at 02/06/2019 0900 Gross per 24 hour  Intake 240 ml  Output 1135 ml  Net -895 ml   Filed Weights   02/03/19 1037 02/05/19 0446 02/06/19 0314  Weight: 99.5 kg 104.3 kg 101.9  kg    Examination:  General exam: Appears calm and comfortable  Respiratory system: Clear to auscultation. Respiratory effort normal. Cardiovascular system: S1 & S2 heard, RRR. No JVD, murmurs, rubs, gallops or clicks. No pedal edema. Gastrointestinal system: Abdomen is nondistended, soft and nontender. No organomegaly or masses felt. Normal bowel sounds heard. Central nervous system: Alert and oriented. No focal neurological deficits. Extremities: Symmetric 5 x 5 power. Skin: Right lower extremity dressing in place. Psychiatry: Judgement and insight appear normal. Mood & affect appropriate.     LOS: 7 days   Total time spent for this encounter is 25 mins   [name], MD [facility]  If 7PM-7AM, please contact night-coverage  02/06/2019, 3:32 PM

## 2019-02-06 NOTE — Progress Notes (Signed)
Physical Therapy Treatment Patient Details Name: Robert Horn MRN: [id number] DOB: 09/16/1933 Today's Date: 02/06/2019    History of Present Illness Robert Horn is a 85 y.o. male with medical history significant of hypertension, hyperlipidemia, combined systolic and diastolic congestive heart failure with ejection fraction of 40 to 45%, severe aortic stenosis s/p TAVR, colon mass s/p laparoscopic partial colectomy on 12/15/2017 presents to emergency department due to worsening right foot pain, ulcer, swelling and redness since 10 days.  Pt underwentRLE revascularization on 11/4 followed by R 1st ray amputation on 11/6, now NWB RLE.    PT Comments    Pt tolerated treatment well, initially hesitant to work with PT due to Ortho recommendations to elevate leg however this was clarified and pt is able to mobilize with PT, elevating leg again upon completion. Pt requires physical assistance for all OOB mobility to maintain balance and to ensure pt is abiding by NWB precautions on RLE. Pt hop to gait with RW is labored and pt is unbalance. Pt with improved balance utilizing scooter, however having difficulty navigating turns. Pt will continued to benefit form PT POC to continue progression of transfer and gait training to improve safety with OOB activity. Pt will also benefit from stair training in next 1-2 sessions.   Follow Up Recommendations  Home health PT;Supervision/Assistance - 24 hour     Equipment Recommendations  3in1 (PT);Other (comment)(knee scooter, pt already owns RW)    Recommendations for Other Services       Precautions / Restrictions Precautions Precautions: Fall Restrictions Weight Bearing Restrictions: Yes RLE Weight Bearing: Non weight bearing    Mobility  Bed Mobility Overal bed mobility: Independent                Transfers Overall transfer level: Needs assistance Equipment used: Rolling walker (2 wheeled)(scooter) Transfers: Sit to/from [employer] Sit to Stand: Min assist Stand pivot transfers: Min assist       General transfer comment: PT cueing for rocking to build momentum as well as hand placement for improved device management  Ambulation/Gait Ambulation/Gait assistance: Min assist Gait Distance (Feet): 20 Feet(20' with RW, 110' with scooter) Assistive device: Rolling walker (2 wheeled)(knee scooter) Gait Pattern/deviations: (hop to gait) Gait velocity: reduced Gait velocity interpretation: <1.31 ft/sec, indicative of household ambulator General Gait Details: short hop to gait, patient with increased sway during turns. Pt with improved stability utilizing knee scooter   Stairs             Wheelchair Mobility    Modified Rankin (Stroke Patients Only)       Balance Overall balance assessment: Needs assistance Sitting-balance support: Feet supported;No upper extremity supported Sitting balance-Leahy Scale: Good     Standing balance support: Bilateral upper extremity supported Standing balance-Leahy Scale: Fair Standing balance comment: minA with use of RW or scooter                            Cognition Arousal/Alertness: Awake/alert Behavior During Therapy: WFL for tasks assessed/performed Overall Cognitive Status: Within Functional Limits for tasks assessed                                 General Comments: pt is impulsive      Exercises General Exercises - Upper Extremity Shoulder Flexion: Strengthening;Both;20 reps;Supine;Theraband Theraband Level (Shoulder Flexion): Level 2 (Red) Shoulder Horizontal ABduction: Strengthening;Both;20 reps;Supine;Theraband Theraband Level (Shoulder  Horizontal Abduction): Level 2 (Red) Elbow Flexion: Strengthening;20 reps;Supine;Theraband Theraband Level (Elbow Flexion): Level 2 (Red) Elbow Extension: Strengthening;Both;20 reps;Supine;Theraband Theraband Level (Elbow Extension): Level 2 (Red)    General Comments         Pertinent Vitals/Pain Pain Assessment: No/denies pain Faces Pain Scale: Hurts little more Pain Location: right foot Pain Descriptors / Indicators: Sore Pain Intervention(s): Premedicated before session    Home Living                      Prior Function            PT Goals (current goals can now be found in the care plan section) Acute Rehab PT Goals Patient Stated Goal: to go home Progress towards PT goals: Progressing toward goals    Frequency    Min 3X/week      PT Plan Current plan remains appropriate    Co-evaluation              AM-PAC PT "6 Clicks" Mobility   Outcome Measure  Help needed turning from your back to your side while in a flat bed without using bedrails?: None Help needed moving from lying on your back to sitting on the side of a flat bed without using bedrails?: None Help needed moving to and from a bed to a chair (including a wheelchair)?: A Little Help needed standing up from a chair using your arms (e.g., wheelchair or bedside chair)?: A Little Help needed to walk in hospital room?: A Little Help needed climbing 3-5 steps with a railing? : A Lot 6 Click Score: 19    End of Session Equipment Utilized During Treatment: Gait belt Activity Tolerance: Patient tolerated treatment well Patient left: in bed;with call bell/phone within reach Nurse Communication: Mobility status PT Visit Diagnosis: Muscle weakness (generalized) (M62.81)     Time: [id number] PT Time Calculation (min) (ACUTE ONLY): 23 min  Charges:  $Gait Training: 8-22 mins $Therapeutic Activity: 8-22 mins                     Zenaida Niece, PT, DPT Acute Rehabilitation Pager: [phone]    Zenaida Niece 02/06/2019, 1:46 PM

## 2019-02-06 NOTE — Plan of Care (Signed)

## 2019-02-06 NOTE — Progress Notes (Signed)
POD 3 Right great toe amputation. Alert awake comfortable VSS Dressing changed . Some wound dehiscence 1-2 mm. Foot warm skin edges mild maceration. Central  Area of wound with healthy bleeding tissue  New dressing applied. Emphasize elevation.

## 2019-02-06 NOTE — Progress Notes (Signed)
Occupational Therapy Treatment Patient Details Name: Robert Horn MRN: [id number] DOB: 08-29-1933 Today's Date: 02/06/2019    History of present illness Robert Horn is a 85 y.o. male with medical history significant of hypertension, hyperlipidemia, combined systolic and diastolic congestive heart failure with ejection fraction of 40 to 45%, severe aortic stenosis s/p TAVR, colon mass s/p laparoscopic partial colectomy on 12/15/2017 presents to emergency department due to worsening right foot pain, ulcer, swelling and redness since 10 days.  Pt underwentRLE revascularization on 11/4 followed by R 1st ray amputation on 11/6, now NWB RLE.   OT comments  Pt declining OOB activity, fearful R foot will start bleeding again. Pt agreeable to bed level grooming followed by UE exercise with level 2 theraband.   Follow Up Recommendations  Home health OT;Supervision/Assistance - 24 hour(initially)    Equipment Recommendations  3 in 1 bedside commode    Recommendations for Other Services      Precautions / Restrictions Precautions Precautions: Fall Restrictions Weight Bearing Restrictions: Yes RLE Weight Bearing: Non weight bearing       Mobility Bed Mobility                  Transfers                      Balance                                           ADL either performed or assessed with clinical judgement   ADL       Grooming: Wash/dry hands;Wash/dry face;Oral care;Bed level;Set up                                       Vision       Perception     Praxis      Cognition Arousal/Alertness: Awake/alert Behavior During Therapy: [facility] for tasks assessed/performed Overall Cognitive Status: Within Functional Limits for tasks assessed(for tasks completed this visit)                                          Exercises Exercises: General Upper Extremity General Exercises - Upper Extremity Shoulder Flexion:  Strengthening;Both;20 reps;Supine;Theraband Theraband Level (Shoulder Flexion): Level 2 (Red) Shoulder Horizontal ABduction: Strengthening;Both;20 reps;Supine;Theraband Theraband Level (Shoulder Horizontal Abduction): Level 2 (Red) Elbow Flexion: Strengthening;20 reps;Supine;Theraband Theraband Level (Elbow Flexion): Level 2 (Red) Elbow Extension: Strengthening;Both;20 reps;Supine;Theraband Theraband Level (Elbow Extension): Level 2 (Red)   Shoulder Instructions       General Comments      Pertinent Vitals/ Pain       Pain Assessment: Faces Faces Pain Scale: Hurts little more Pain Location: right foot Pain Descriptors / Indicators: Sore Pain Intervention(s): Premedicated before session  Home Living                                          Prior Functioning/Environment              Frequency  Min 2X/week        Progress Toward Goals  OT Goals(current goals can now be  found in the care plan section)  Progress towards OT goals: Progressing toward goals  Acute Rehab OT Goals Patient Stated Goal: to go home OT Goal Formulation: With patient Time For Goal Achievement: 02/18/19 Potential to Achieve Goals: Good  Plan Discharge plan remains appropriate    Co-evaluation                 AM-PAC OT "6 Clicks" Daily Activity     Outcome Measure   Help from another person eating meals?: None Help from another person taking care of personal grooming?: A Little Help from another person toileting, which includes using toliet, bedpan, or urinal?: A Little Help from another person bathing (including washing, rinsing, drying)?: A Lot Help from another person to put on and taking off regular upper body clothing?: None Help from another person to put on and taking off regular lower body clothing?: A Lot 6 Click Score: 18    End of Session    OT Visit Diagnosis: Unsteadiness on feet (R26.81);Muscle weakness (generalized) (M62.81)   Activity  Tolerance Other (comment)(pt declining EOB or OOB for fear foot would bleed)   Patient Left in bed;with call bell/phone within reach;with nursing/sitter in room   Nurse Communication          Time: 1001-1015 OT Time Calculation (min): 14 min  Charges: OT General Charges $OT Visit: 1 Visit OT Treatments $Therapeutic Exercise: 8-22 mins  Nestor Lewandowsky, OTR/L Acute Rehabilitation Services Pager: [phone] Office: [phone]   [name] 02/06/2019, 12:28 PM

## 2019-02-06 NOTE — Care Management Important Message (Signed)
Important Message  Patient Details  Name: Robert Horn MRN: [id number] Date of Birth: March 09, 1934   Medicare Important Message Given:  Yes     Shelda Altes 02/06/2019, 12:29 PM

## 2019-02-07 NOTE — Plan of Care (Signed)

## 2019-02-07 NOTE — Progress Notes (Signed)
PROGRESS NOTE    Robert Horn  [id number] DOB: 18-Mar-1934 DOA: 01/30/2019 PCP: [name], MD   Brief Narrative:  85 y.o.malewith medical history significant ofhypertension, hyperlipidemia, heart failure with reduced ejection fraction (last echocardiogram with ejection fraction 40 to 45%),   severe aortic stenosis s/p TAVR, colon mass s/p laparoscopic partial colectomy on 12/15/2017 presents to emergency department due to worsening right foot pain, ulcer, swelling and redness since 10 days.  Failed outpatient Keflex treatment.  Found to have right foot necrotic wound for which orthopedic and vascular surgery were consulted.  He is status post right lower extremity revascularization by vascular surgery on 02/01/2019.  He is status post first ray amputation of right foot by orthopedics on 02/03/2019.   Assessment & Plan:   Principal Problem:   Cellulitis of right foot Active Problems:   Essential hypertension   Hyperlipidemia   Chronic combined systolic and diastolic heart failure (HCC)   Severe aortic stenosis   S/P TAVR (transcatheter aortic valve replacement)   CKD (chronic kidney disease), stage III   Elevated LFTs   Colon cancer (HCC)   Anemia of chronic disease   Osteomyelitis of great toe of right foot (HCC)   Severe protein-calorie malnutrition (HCC)   Diabetic polyneuropathy associated with type 2 diabetes mellitus (HCC)   Cutaneous abscess of right foot  Right foot necrotic wound with surrounding cellulitis Peripheral vascular disease Vascular surgery and orthopedic is following  Continue with  IV Unasyn 3 g every 6 hourly.   Status post  arteriogram right lower extremity with revascularization by Vascular surgery. He is status post first ray amputation of right foot on  02/03/2019.   C/w aspirin, Plavix and statin Ortho managing -Dr. Sharol Given  Diabetes mellitus type 2, newly diagnosed.  Hemoglobin A1c 7.0 Continue with sliding scale insulin Fingersticks before  meals and at bedtime Hypoglycemic protocol   Heart failure with reduced ejection fraction.  Last echocardiogram showed ejection fraction of 40%.  Appears stable and not in acute exacerbation. Continue with guideline directed medical therapy with Entresto, Coreg and high intensity statin. Low-sodium diet 2 g Water restriction to 1500 cc daily Daily weight  CKD stage IIIa.  Serum creatinine at baseline of 1.3-1.6. Continue to monitor renal function Avoid nephrotoxic agents.   Essential hypertension Continue with -Norvasc 10 mg daily, Coreg 3.125 mg twice daily  Anemia of chronic disease and iron deficiency Continue with iron supplementation and vitamin C.     History of severe aortic stenosis status post TAVR History of adenocarcinoma of the colon status post partial colectomy in 2019.  Stable.  No intervention at this time.  DVT prophylaxis: Lovenox subcute Code Status: Full code                                            Disposition Plan: Status post right first ray amputation by Ortho  Consultants:   Vascular  Orthopedic  Procedures:  Right lower extremity angiogram with angioplasty/stent  Right first ray amputation  Antimicrobials: IV Vanco and Zosyn, then discontinued  Unasyn 3 g Q 6 hrly   Subjective: Patient denies any acute complaints and states the pain in his foot is tolerable.  He denies any fevers or chills.  He inquires about going home.  Objective: Vitals:   02/06/19 1749 02/06/19 2000 02/07/19 0530 02/07/19 0606  BP: 140/65 138/72 137/69   Pulse: Marland Kitchen)  56 72 (!) 58   Resp: 18 14 12  (!) 8  Temp: 97.7 F (36.5 C) 98.3 F (36.8 C) 98.8 F (37.1 C)   TempSrc: Oral Oral Oral   SpO2: 95% 99% 94%   Weight:    98.8 kg  Height:        Intake/Output Summary (Last 24 hours) at 02/07/2019 1430 Last data filed at 02/07/2019 1319 Gross per 24 hour  Intake 960 ml  Output 1300 ml  Net -340 ml   Filed Weights   02/05/19 0446 02/06/19 0314 02/07/19 0606   Weight: 104.3 kg 101.9 kg 98.8 kg    Examination:  General exam: Appears calm and comfortable  Respiratory system: Clear to auscultation. Respiratory effort normal. Cardiovascular system: S1 & S2 heard, RRR. No JVD, murmurs, rubs, gallops or clicks. No pedal edema. Gastrointestinal system: Abdomen is nondistended, soft and nontender. No organomegaly or masses felt. Normal bowel sounds heard. Central nervous system: Alert and oriented. No focal neurological deficits. Extremities: Symmetric 5 x 5 power. Skin: Right lower extremity dressing in place. Psychiatry: Judgement and insight appear normal. Mood & affect appropriate.     LOS: 8 days   Total time spent for this encounter is 25 mins   [name], MD [facility]  If 7PM-7AM, please contact night-coverage  02/07/2019, 2:30 PM

## 2019-02-07 NOTE — Progress Notes (Signed)
Physical Therapy Treatment Patient Details Name: Robert Horn MRN: [id number] DOB: 03/10/1934 Today's Date: 02/07/2019    History of Present Illness Robert Horn is a 85 y.o. male with medical history significant of hypertension, hyperlipidemia, combined systolic and diastolic congestive heart failure with ejection fraction of 40 to 45%, severe aortic stenosis s/p TAVR, colon mass s/p laparoscopic partial colectomy on 12/15/2017 presents to emergency department due to worsening right foot pain, ulcer, swelling and redness since 10 days.  Pt underwentRLE revascularization on 11/4 followed by R 1st ray amputation on 11/6, now NWB RLE.    PT Comments    Patient progressing slowly towards PT goals. Tolerated gait training with use of RW for support; fatigues quickly and only able to tolerate about 20' max before giving out. Lengthy discussion re: best way to mobilize at home, home environment, which device is best etc. Pt reports his wife will check on RW he might have in the attic. Will likely need w/c for home. Reports his house is not setup for a scooter due to it being so small but would need a w/c for distances greater than 20 feet and for community use. Pt is a high fall risk when hopping with RW due to impulsivity and NWB status.  Will continue to follow and progress as tolerated. Will need to negotiate stairs prior to d/c. Will follow.   Follow Up Recommendations  Home health PT;Supervision/Assistance - 24 hour     Equipment Recommendations  Rolling walker with 5" wheels;3in1 (PT);Wheelchair (measurements PT);Wheelchair cushion (measurements PT)    Recommendations for Other Services       Precautions / Restrictions Precautions Precautions: Fall Restrictions Weight Bearing Restrictions: Yes RLE Weight Bearing: Non weight bearing    Mobility  Bed Mobility Overal bed mobility: Modified Independent             General bed mobility comments: Able to get to EOB and into bed  without issues.  Transfers Overall transfer level: Needs assistance Equipment used: Rolling walker (2 wheeled) Transfers: Sit to/from Stand Sit to Stand: Min guard         General transfer comment: Min guard for safety as pt with increased effort to stand and maintain NWB RLE.Use of momentum to stand. Mildly unsteady once upright.  Ambulation/Gait Ambulation/Gait assistance: Min assist Gait Distance (Feet): 20 Feet Assistive device: Rolling walker (2 wheeled) Gait Pattern/deviations: ("hop to") Gait velocity: unsafely fast esp towards end   General Gait Details: short hop to gait with cues for RW proximity/management and to decrease speed for safety. Fatigues quickly. Poorly able to self monitor symptoms/fatigue.   Stairs             Wheelchair Mobility    Modified Rankin (Stroke Patients Only)       Balance Overall balance assessment: Needs assistance Sitting-balance support: Feet supported;No upper extremity supported Sitting balance-Leahy Scale: Good     Standing balance support: During functional activity Standing balance-Leahy Scale: Poor Standing balance comment: Needs Ue support for static and dynamic standing                            Cognition Arousal/Alertness: Awake/alert Behavior During Therapy: WFL for tasks assessed/performed Overall Cognitive Status: Within Functional Limits for tasks assessed                                 General Comments: Mildly impulsive  Exercises      General Comments General comments (skin integrity, edema, etc.): VSS throughout, up to 101 bpm with mobility.      Pertinent Vitals/Pain Pain Assessment: No/denies pain    Home Living                      Prior Function            PT Goals (current goals can now be found in the care plan section) Progress towards PT goals: Progressing toward goals    Frequency    Min 3X/week      PT Plan Current plan remains  appropriate    Co-evaluation              AM-PAC PT "6 Clicks" Mobility   Outcome Measure  Help needed turning from your back to your side while in a flat bed without using bedrails?: None Help needed moving from lying on your back to sitting on the side of a flat bed without using bedrails?: None Help needed moving to and from a bed to a chair (including a wheelchair)?: A Little Help needed standing up from a chair using your arms (e.g., wheelchair or bedside chair)?: A Little Help needed to walk in hospital room?: A Little Help needed climbing 3-5 steps with a railing? : A Lot 6 Click Score: 19    End of Session Equipment Utilized During Treatment: Gait belt Activity Tolerance: Patient limited by fatigue Patient left: in bed;with call bell/phone within reach Nurse Communication: Mobility status PT Visit Diagnosis: Muscle weakness (generalized) (M62.81)     Time: [id number] PT Time Calculation (min) (ACUTE ONLY): 22 min  Charges:  $Gait Training: 8-22 mins                     Marisa Severin, PT, DPT Acute Rehabilitation Services Pager [phone] Office [phone]       [name] 02/07/2019, 10:06 AM

## 2019-02-08 LAB — CBC
HCT: 26.9 % — ABNORMAL LOW (ref 39.0–52.0)
Hemoglobin: 8.5 g/dL — ABNORMAL LOW (ref 13.0–17.0)
MCH: 29.2 pg (ref 26.0–34.0)
MCHC: 31.6 g/dL (ref 30.0–36.0)
MCV: 92.4 fL (ref 80.0–100.0)
Platelets: 325 10*3/uL (ref 150–400)
RBC: 2.91 MIL/uL — ABNORMAL LOW (ref 4.22–5.81)
RDW: 13.3 % (ref 11.5–15.5)
WBC: 7.4 10*3/uL (ref 4.0–10.5)
nRBC: 0 % (ref 0.0–0.2)

## 2019-02-08 LAB — BASIC METABOLIC PANEL
Anion gap: 7 (ref 5–15)
BUN: 19 mg/dL (ref 8–23)
CO2: 27 mmol/L (ref 22–32)
Calcium: 8.7 mg/dL — ABNORMAL LOW (ref 8.9–10.3)
Chloride: 105 mmol/L (ref 98–111)
Creatinine, Ser: 1.27 mg/dL — ABNORMAL HIGH (ref 0.61–1.24)
GFR calc Af Amer: >60 mL/min (ref >60)
GFR calc non Af Amer: 52 mL/min — ABNORMAL LOW (ref >60)
Glucose, Bld: 114 mg/dL — ABNORMAL HIGH (ref 70–99)
Potassium: 4.4 mmol/L (ref 3.5–5.1)
Sodium: 139 mmol/L (ref 135–145)

## 2019-02-08 NOTE — Progress Notes (Signed)
PROGRESS NOTE    Robert Horn  [id number] DOB: 01/02/1934 DOA: 01/30/2019 PCP: [name], MD   Brief Narrative:  85 y.o.malewith medical history significant ofhypertension, hyperlipidemia, heart failure with reduced ejection fraction (last echocardiogram with ejection fraction 40 to 45%),   severe aortic stenosis s/p TAVR, colon mass s/p laparoscopic partial colectomy on 12/15/2017 presents to emergency department due to worsening right foot pain, ulcer, swelling and redness since 10 days.  Failed outpatient Keflex treatment.  Found to have right foot necrotic wound for which orthopedic and vascular surgery were consulted.  He is status post right lower extremity revascularization by vascular surgery on 02/01/2019.  He is status post first ray amputation of right foot by orthopedics on 02/03/2019.   Assessment & Plan:   Principal Problem:   Cellulitis of right foot Active Problems:   Essential hypertension   Hyperlipidemia   Chronic combined systolic and diastolic heart failure (HCC)   Severe aortic stenosis   S/P TAVR (transcatheter aortic valve replacement)   CKD (chronic kidney disease), stage III   Elevated LFTs   Colon cancer (HCC)   Anemia of chronic disease   Osteomyelitis of great toe of right foot (HCC)   Severe protein-calorie malnutrition (HCC)   Diabetic polyneuropathy associated with type 2 diabetes mellitus (HCC)   Cutaneous abscess of right foot  Right foot necrotic wound with surrounding cellulitis Peripheral vascular disease Vascular surgery and orthopedic is following  Continue with  IV Unasyn 3 g every 6 hourly.   Status post  arteriogram right lower extremity with revascularization by Vascular surgery. He is status post first ray amputation of right foot on  02/03/2019.   C/w aspirin, Plavix and statin Patient will ultimately be discharged with home health when okay with Ortho Ortho managing -Dr. Sharol Given  Diabetes mellitus type 2, newly diagnosed.   Hemoglobin A1c 7.0 Continue with sliding scale insulin Fingersticks before meals and at bedtime Hypoglycemic protocol   Heart failure with reduced ejection fraction.  Last echocardiogram showed ejection fraction of 40%.  Appears stable and not in acute exacerbation. Continue with guideline directed medical therapy with Entresto, Coreg and high intensity statin. Low-sodium diet 2 g Water restriction to 1500 cc daily Daily weight  CKD stage IIIa.  Serum creatinine at baseline of 1.3-1.6. Continue to monitor renal function Avoid nephrotoxic agents.   Essential hypertension Continue with -Norvasc 10 mg daily, Coreg 3.125 mg twice daily  Anemia of chronic disease and iron deficiency Continue with iron supplementation and vitamin C.     History of severe aortic stenosis status post TAVR History of adenocarcinoma of the colon status post partial colectomy in 2019.  Stable.  No intervention at this time.  DVT prophylaxis: Lovenox subcute Code Status: Full code                                            Disposition Plan: Status post right first ray amputation by Ortho  Consultants:   Vascular  Orthopedic  Procedures:  Right lower extremity angiogram with angioplasty/stent  Right first ray amputation  Antimicrobials: IV Vanco and Zosyn, then discontinued  Unasyn 3 g Q 6 hrly   Subjective: No acute complaints today including pain in the right foot, fevers, chills, nausea, vomiting, diarrhea.  Objective: Vitals:   02/07/19 0606 02/07/19 2020 02/08/19 0620 02/08/19 0637  BP:  131/67 (!) 143/78   Pulse:  Marland Kitchen)  55 (!) 51   Resp: (!) 8 17 (!) 9 (!) 30  Temp:  98.3 F (36.8 C) 98 F (36.7 C)   TempSrc:  Oral Oral   SpO2:  97% 94%   Weight: 98.8 kg   97.7 kg  Height:        Intake/Output Summary (Last 24 hours) at 02/08/2019 1514 Last data filed at 02/08/2019 1351 Gross per 24 hour  Intake 880 ml  Output 975 ml  Net -95 ml   Filed Weights   02/06/19 0314 02/07/19  0606 02/08/19 0637  Weight: 101.9 kg 98.8 kg 97.7 kg    Examination:  General exam: Appears calm and comfortable  Respiratory system: Clear to auscultation. Respiratory effort normal. Cardiovascular system: S1 & S2 heard, RRR. No JVD, murmurs, rubs, gallops or clicks. No pedal edema. Gastrointestinal system: Abdomen is nondistended, soft and nontender. No organomegaly or masses felt. Normal bowel sounds heard. Central nervous system: Alert and oriented. No focal neurological deficits. Extremities: Symmetric 5 x 5 power. Skin: Right lower extremity dressing in place. Psychiatry: Judgement and insight appear normal. Mood & affect appropriate.     LOS: 9 days   Total time spent for this encounter is 25 mins   [name], MD [facility]  If 7PM-7AM, please contact night-coverage  02/08/2019, 3:14 PM

## 2019-02-08 NOTE — Plan of Care (Signed)

## 2019-02-08 NOTE — Plan of Care (Signed)

## 2019-02-08 NOTE — Progress Notes (Signed)
Occupational Therapy Treatment Patient Details Name: Robert Horn MRN: [id number] DOB: 10/26/1933 Today's Date: 02/08/2019    History of present illness Robert Horn is a 85 y.o. male with medical history significant of hypertension, hyperlipidemia, combined systolic and diastolic congestive heart failure with ejection fraction of 40 to 45%, severe aortic stenosis s/p TAVR, colon mass s/p laparoscopic partial colectomy on 12/15/2017 presents to emergency department due to worsening right foot pain, ulcer, swelling and redness since 10 days.  Pt underwentRLE revascularization on 11/4 followed by R 1st ray amputation on 11/6, now NWB RLE.   OT comments  Pt tolerated session well and maintained NWB throughout. Sat EOB for level 2 theraband exercises.  Stood x 2 and sidestepped via hopping and heel/toe alternating. Pt did mishear cue and stepped forward with LLE resulting in posterior LOB, requiring assistance to right himself; NWB was maintained  Follow Up Recommendations  Home health OT;Supervision/Assistance - 24 hour    Equipment Recommendations  3 in 1 bedside commode    Recommendations for Other Services      Precautions / Restrictions Precautions Precautions: Fall Restrictions RLE Weight Bearing: Non weight bearing       Mobility Bed Mobility Overal bed mobility: Modified Independent                Transfers   Equipment used: Rolling walker (2 wheeled)   Sit to Stand: Min guard         General transfer comment: min A to sidestep (hop and heel/toe to get towards [facility]).  Min misheard cues and hopped forward with L foot; mod A given for balance as he started to lean posteriorly    Balance                                           ADL either performed or assessed with clinical judgement   ADL                                          assisted wife with changing gown (2* lines)     Vision       Perception     Praxis       Cognition Arousal/Alertness: Awake/alert Behavior During Therapy: WFL for tasks assessed/performed                                   General Comments: Mildly impulsive.  [facility]        Exercises General Exercises - Upper Extremity Shoulder Flexion: Strengthening;Both;Supine;Theraband;10 reps Theraband Level (Shoulder Flexion): Level 2 (Red) Shoulder Horizontal ABduction: Strengthening;Both;Supine;Theraband;10 reps Theraband Level (Shoulder Horizontal Abduction): Level 2 (Red)   Shoulder Instructions       General Comments      Pertinent Vitals/ Pain       Pain Assessment: No/denies pain  Home Living                                          Prior Functioning/Environment              Frequency  Min 2X/week        Progress  Toward Goals  OT Goals(current goals can now be found in the care plan section)  Progress towards OT goals: Progressing toward goals     Plan      Co-evaluation                 AM-PAC OT "6 Clicks" Daily Activity     Outcome Measure   Help from another person eating meals?: None Help from another person taking care of personal grooming?: A Little Help from another person toileting, which includes using toliet, bedpan, or urinal?: A Little Help from another person bathing (including washing, rinsing, drying)?: A Lot Help from another person to put on and taking off regular upper body clothing?: A Little Help from another person to put on and taking off regular lower body clothing?: A Lot 6 Click Score: 17    End of Session    OT Visit Diagnosis: Unsteadiness on feet (R26.81);Muscle weakness (generalized) (M62.81)   Activity Tolerance Patient tolerated treatment well   Patient Left in bed;with call bell/phone within reach;with nursing/sitter in room   Nurse Communication          Time: [id number] OT Time Calculation (min): 28 min  Charges: OT General Charges $OT Visit: 1 Visit OT  Treatments $Therapeutic Activity: 8-22 mins $Therapeutic Exercise: 8-22 mins  Lesle Chris, OTR/L Acute Rehabilitation Services [phone] [facility] pager [phone] office 02/08/2019   Robert Horn 02/08/2019, 2:44 PM

## 2019-02-09 LAB — BASIC METABOLIC PANEL
Anion gap: 9 (ref 5–15)
BUN: 19 mg/dL (ref 8–23)
CO2: 25 mmol/L (ref 22–32)
Calcium: 8.9 mg/dL (ref 8.9–10.3)
Chloride: 107 mmol/L (ref 98–111)
Creatinine, Ser: 1.21 mg/dL (ref 0.61–1.24)
GFR calc Af Amer: >60 mL/min (ref >60)
GFR calc non Af Amer: 55 mL/min — ABNORMAL LOW (ref >60)
Glucose, Bld: 107 mg/dL — ABNORMAL HIGH (ref 70–99)
Potassium: 4.2 mmol/L (ref 3.5–5.1)
Sodium: 141 mmol/L (ref 135–145)

## 2019-02-09 LAB — CBC
HCT: 27.1 % — ABNORMAL LOW (ref 39.0–52.0)
Hemoglobin: 8.7 g/dL — ABNORMAL LOW (ref 13.0–17.0)
MCH: 29.8 pg (ref 26.0–34.0)
MCHC: 32.1 g/dL (ref 30.0–36.0)
MCV: 92.8 fL (ref 80.0–100.0)
Platelets: 325 10*3/uL (ref 150–400)
RBC: 2.92 MIL/uL — ABNORMAL LOW (ref 4.22–5.81)
RDW: 13.4 % (ref 11.5–15.5)
WBC: 6.6 10*3/uL (ref 4.0–10.5)
nRBC: 0 % (ref 0.0–0.2)

## 2019-02-09 NOTE — Progress Notes (Signed)
Physical Therapy Treatment Patient Details Name: Robert Horn MRN: [id number] DOB: 1933/07/25 Today's Date: 02/09/2019    History of Present Illness Robert Horn is a 85 y.o. male with medical history significant of hypertension, hyperlipidemia, combined systolic and diastolic congestive heart failure with ejection fraction of 40 to 45%, severe aortic stenosis s/p TAVR, colon mass s/p laparoscopic partial colectomy on 12/15/2017 presents to emergency department due to worsening right foot pain, ulcer, swelling and redness since 10 days.  Pt underwentRLE revascularization on 11/4 followed by R 1st ray amputation on 11/6, now NWB RLE.    PT Comments    Patient received in bed, pleasant and willing to participate in PT session today. Continues to perform bed mobility with Mod(I), required min guard for functional transfers from elevated bed with RW and NWB R LE. Introduced IT trainer- able to perform one step with MinA and RW while maintaining NWB R LE, however on second step became too fatigued and unable to maintain WB precautions. Then able to gait train approximately 18f to chair with RW and MinA, multiple rest breaks due to fatigue. Extensive education provided to patient, spouse, and RN about options for getting into the home- PT currently recommending WC bump technique (visually demonstrated with empty WC) as patient cannot maintain NWB precautions for more than a single stair, concerned that wife may need assist bumping chair up steps due to relative size difference between her and the patient as well.  RN to follow up with case manager and MD to assess options for assist with discharge (possible PTAR if insurance allows, will defer to case manager on that- or timing DC so spouse will have assist from family to bump WC up steps into home). Patient left up in chair with all needs met and spouse present this afternoon.   Follow Up Recommendations  Home health PT;Supervision/Assistance - 24  hour     Equipment Recommendations  Rolling walker with 5" wheels;3in1 (PT);Wheelchair (measurements PT);Wheelchair cushion (measurements PT)(elevating leg rests)    Recommendations for Other Services       Precautions / Restrictions Precautions Precautions: Fall Restrictions Weight Bearing Restrictions: Yes RLE Weight Bearing: Non weight bearing    Mobility  Bed Mobility Overal bed mobility: Modified Independent             General bed mobility comments: Able to get to EOB and into bed without issues.  Transfers Overall transfer level: Needs assistance Equipment used: Rolling walker (2 wheeled) Transfers: Sit to/from Stand Sit to Stand: Min guard;From elevated surface         General transfer comment: cues for hand placement and safety, sequencing  Ambulation/Gait Ambulation/Gait assistance: Min assist Gait Distance (Feet): 15 Feet Assistive device: Rolling walker (2 wheeled) Gait Pattern/deviations: (hop to gait pattern with RW) Gait velocity: decreased   General Gait Details: hop to gait with RW, very easily fatigued and mulitple standing rest breaks just for this short distance. Cues to maintain safety with fatigue.   Stairs Stairs: Yes Stairs assistance: Min assist Stair Management: No rails;With walker Number of Stairs: 2(2 single steps) General stair comments: 1 step performed 2 times with RW backwards ascent/forwards descent. On first attempt able to perform correctly with MinA for balance/RW management and maintain NWB R LE; on second attempt unable to maintain NWB due to fatigue.   Wheelchair Mobility    Modified Rankin (Stroke Patients Only)       Balance Overall balance assessment: Needs assistance Sitting-balance support: Feet supported;No upper extremity supported  Sitting balance-Leahy Scale: Normal     Standing balance support: Bilateral upper extremity supported;During functional activity Standing balance-Leahy Scale:  Fair Standing balance comment: Needs Ue support for static and dynamic standing                            Cognition Arousal/Alertness: Awake/alert Behavior During Therapy: WFL for tasks assessed/performed Overall Cognitive Status: Within Functional Limits for tasks assessed Area of Impairment: Safety/judgement                         Safety/Judgement: Decreased awareness of safety     General Comments: Mildly impulsive.  HOH      Exercises      General Comments General comments (skin integrity, edema, etc.): VSS      Pertinent Vitals/Pain Pain Assessment: No/denies pain Pain Score: 0-No pain Pain Intervention(s): Limited activity within patient's tolerance;Monitored during session    Home Living                      Prior Function            PT Goals (current goals can now be found in the care plan section) Acute Rehab PT Goals Patient Stated Goal: to go home PT Goal Formulation: With patient/family Time For Goal Achievement: 02/17/19 Potential to Achieve Goals: Good Progress towards PT goals: Progressing toward goals    Frequency    Min 3X/week      PT Plan Current plan remains appropriate    Co-evaluation              AM-PAC PT "6 Clicks" Mobility   Outcome Measure  Help needed turning from your back to your side while in a flat bed without using bedrails?: None Help needed moving from lying on your back to sitting on the side of a flat bed without using bedrails?: None Help needed moving to and from a bed to a chair (including a wheelchair)?: A Little Help needed standing up from a chair using your arms (e.g., wheelchair or bedside chair)?: A Little Help needed to walk in hospital room?: A Little Help needed climbing 3-5 steps with a railing? : A Lot 6 Click Score: 19    End of Session Equipment Utilized During Treatment: Gait belt Activity Tolerance: Patient limited by fatigue Patient left: in chair;with  call bell/phone within reach;with family/visitor present Nurse Communication: Mobility status;Other (comment)(barriers to DC/step performance) PT Visit Diagnosis: Muscle weakness (generalized) (M62.81) Pain - Right/Left: Right Pain - part of body: Ankle and joints of foot     Time: 1200-1257 PT Time Calculation (min) (ACUTE ONLY): 57 min  Charges:  $Gait Training: 23-37 mins $Self Care/Home Management: 23-37                     Windell Norfolk, DPT, CBIS  Supplemental Physical Therapist [place]    Pager [phone] Acute Rehab Office [phone]

## 2019-02-09 NOTE — Progress Notes (Signed)
PROGRESS NOTE    Robert Horn  [id number] DOB: Jul 29, 1933 DOA: 01/30/2019 PCP: [name], MD   Brief Narrative:  85 y.o.malewith medical history significant ofhypertension, hyperlipidemia, heart failure with reduced ejection fraction (last echocardiogram with ejection fraction 40 to 45%),   severe aortic stenosis s/p TAVR, colon mass s/p laparoscopic partial colectomy on 12/15/2017 presents to emergency department due to worsening right foot pain, ulcer, swelling and redness since 10 days.  Failed outpatient Keflex treatment.  Found to have right foot necrotic wound for which orthopedic and vascular surgery were consulted.  He is status post right lower extremity revascularization by vascular surgery on 02/01/2019.  He is status post first ray amputation of right foot by orthopedics on 02/03/2019.   Assessment & Plan:   Principal Problem:   Cellulitis of right foot Active Problems:   Essential hypertension   Hyperlipidemia   Chronic combined systolic and diastolic heart failure (HCC)   Severe aortic stenosis   S/P TAVR (transcatheter aortic valve replacement)   CKD (chronic kidney disease), stage III   Elevated LFTs   Colon cancer (HCC)   Anemia of chronic disease   Osteomyelitis of great toe of right foot (HCC)   Severe protein-calorie malnutrition (HCC)   Diabetic polyneuropathy associated with type 2 diabetes mellitus (HCC)   Cutaneous abscess of right foot  Right foot necrotic wound with surrounding cellulitis Peripheral vascular disease Vascular surgery and orthopedic is following  Continue with  IV Unasyn 3 g every 6 hourly.   Status post  arteriogram right lower extremity with revascularization by Vascular surgery. He is status post first ray amputation of right foot on  02/03/2019.   C/w aspirin, Plavix and statin Patient will ultimately be discharged with home health when okay with Ortho Ortho managing -Dr. Sharol Given  Diabetes mellitus type 2, newly diagnosed.   Hemoglobin A1c 7.0 Continue with sliding scale insulin Fingersticks before meals and at bedtime Hypoglycemic protocol   Heart failure with reduced ejection fraction.  Last echocardiogram showed ejection fraction of 40%.  Appears stable and not in acute exacerbation. Continue with guideline directed medical therapy with Entresto, Coreg and high intensity statin. Low-sodium diet 2 g Water restriction to 1500 cc daily Daily weight  CKD stage IIIa.  Serum creatinine at baseline of 1.3-1.6. Continue to monitor renal function Avoid nephrotoxic agents.   Essential hypertension Continue with -Norvasc 10 mg daily, Coreg 3.125 mg twice daily  Anemia of chronic disease and iron deficiency Continue with iron supplementation and vitamin C.     History of severe aortic stenosis status post TAVR History of adenocarcinoma of the colon status post partial colectomy in 2019.  Stable.  No intervention at this time.  DVT prophylaxis: Lovenox subcute Code Status: Full code                                            Disposition Plan: Home with home health for PT when okay with orthopedic surgery  Consultants:   Vascular  Orthopedic  Procedures:  Right lower extremity angiogram with angioplasty/stent  Right first ray amputation  Antimicrobials: IV Vanco and Zosyn, then discontinued  Unasyn 3 g Q 6 hrly   Subjective: No acute complaints today including pain in the right foot, fevers, chills, nausea, vomiting, diarrhea.  Objective: Vitals:   02/08/19 2005 02/09/19 0336 02/09/19 0752 02/09/19 1412  BP: 119/67 (!) 148/68 132/76  134/67  Pulse: (!) 55 (!) 53 (!) 50   Resp: 17 14 15    Temp: 98.4 F (36.9 C) 98 F (36.7 C) 97.8 F (36.6 C) 97.6 F (36.4 C)  TempSrc: Oral Oral Oral Oral  SpO2: 94% 98% 98%   Weight:  97.9 kg    Height:        Intake/Output Summary (Last 24 hours) at 02/09/2019 1553 Last data filed at 02/09/2019 1116 Gross per 24 hour  Intake 480 ml  Output 1450  ml  Net -970 ml   Filed Weights   02/07/19 0606 02/08/19 0637 02/09/19 0336  Weight: 98.8 kg 97.7 kg 97.9 kg    Examination:  General exam: Appears calm and comfortable  Respiratory system: Clear to auscultation. Respiratory effort normal. Cardiovascular system: S1 & S2 heard, RRR. No JVD, murmurs, rubs, gallops or clicks. No pedal edema. Gastrointestinal system: Abdomen is nondistended, soft and nontender. No organomegaly or masses felt. Normal bowel sounds heard. Central nervous system: Alert and oriented. No focal neurological deficits. Extremities: Symmetric 5 x 5 power. Skin: Right lower extremity dressing in place. Psychiatry: Judgement and insight appear normal. Mood & affect appropriate.     LOS: 10 days   Total time spent for this encounter is 25 mins   [name], MD [facility]  If 7PM-7AM, please contact night-coverage  02/09/2019, 3:53 PM

## 2019-02-10 ENCOUNTER — Telehealth: Payer: Self-pay | Admitting: Physician Assistant

## 2019-02-10 LAB — CBC
HCT: 27.6 % — ABNORMAL LOW (ref 39.0–52.0)
Hemoglobin: 8.7 g/dL — ABNORMAL LOW (ref 13.0–17.0)
MCH: 29.5 pg (ref 26.0–34.0)
MCHC: 31.5 g/dL (ref 30.0–36.0)
MCV: 93.6 fL (ref 80.0–100.0)
Platelets: 336 10*3/uL (ref 150–400)
RBC: 2.95 MIL/uL — ABNORMAL LOW (ref 4.22–5.81)
RDW: 13.4 % (ref 11.5–15.5)
WBC: 6 10*3/uL (ref 4.0–10.5)
nRBC: 0 % (ref 0.0–0.2)

## 2019-02-10 LAB — BASIC METABOLIC PANEL
Anion gap: 8 (ref 5–15)
BUN: 17 mg/dL (ref 8–23)
CO2: 25 mmol/L (ref 22–32)
Calcium: 8.9 mg/dL (ref 8.9–10.3)
Chloride: 107 mmol/L (ref 98–111)
Creatinine, Ser: 1.38 mg/dL — ABNORMAL HIGH (ref 0.61–1.24)
GFR calc Af Amer: 54 mL/min — ABNORMAL LOW (ref >60)
GFR calc non Af Amer: 47 mL/min — ABNORMAL LOW (ref >60)
Glucose, Bld: 113 mg/dL — ABNORMAL HIGH (ref 70–99)
Potassium: 4.4 mmol/L (ref 3.5–5.1)
Sodium: 140 mmol/L (ref 135–145)

## 2019-02-10 MED ORDER — GABAPENTIN 300 MG PO CAPS: 300.0000 mg | ORAL_CAPSULE | Freq: Three times a day (TID) | ORAL | 0 refills | Status: DC

## 2019-02-10 MED ORDER — CLOPIDOGREL BISULFATE 75 MG PO TABS: 75.0000 mg | ORAL_TABLET | Freq: Every day | ORAL | 0 refills | Status: DC

## 2019-02-10 NOTE — Telephone Encounter (Signed)
Verbal orders to D/C patient was given to [place] per physician orders

## 2019-02-10 NOTE — Telephone Encounter (Signed)
Lauren from [facility] called wanting to know if the patient is okay to be discharged.  CB#[phone].  Thank you.

## 2019-02-10 NOTE — Discharge Summary (Signed)
Physician Discharge Summary  Robert Horn [id number] DOB: 14-Jun-1933 DOA: 01/30/2019  PCP: [name], MD  Admit date: 01/30/2019 Discharge date: 02/10/2019  Admitted From: Home Disposition: Home  Recommendations for Outpatient Follow-up:  1. Follow up with PCP in 1-2 weeks 2. Please obtain BMP/CBC in one week  Home Health: Yes Equipment/Devices: Rolling walker with 5 inch wheels, 3 and 1, wheelchair, wheelchair cushion  Discharge Condition: Stable CODE STATUS: Full Diet recommendation: Heart Healthy / Carb Modified / Regular / Dysphagia   Brief/Interim Summary: 85 y.o.malewith medical history significant ofhypertension, hyperlipidemia, heart failure with reduced ejection fraction (last echocardiogram with ejection fraction 40 to 45%),   severe aortic stenosis s/p TAVR, colon mass s/p laparoscopic partial colectomy on 12/15/2017 presents to emergency department due to worsening right foot pain, ulcer, swelling and redness since 10 days.  Failed outpatient Keflex treatment.  Found to have right foot necrotic wound for which orthopedic and vascular surgery were consulted.  He is status post right lower extremity revascularization by vascular surgery on 02/01/2019.  He is status post first ray amputation of right foot by orthopedics on 02/03/2019.  He was continued on IV Unasyn through 02/10/2019 at which point orthopedic surgery felt it was stable for the patient to be discharged home without any antibiotics but close follow-up in their office.  He will continue with home health for physical therapy upon discharge.  Discharge Diagnoses:  Principal Problem:   Cellulitis of right foot Active Problems:   Essential hypertension   Hyperlipidemia   Chronic combined systolic and diastolic heart failure (HCC)   Severe aortic stenosis   S/P TAVR (transcatheter aortic valve replacement)   CKD (chronic kidney disease), stage III   Elevated LFTs   Colon cancer (HCC)   Anemia of chronic  disease   Osteomyelitis of great toe of right foot (HCC)   Severe protein-calorie malnutrition (HCC)   Diabetic polyneuropathy associated with type 2 diabetes mellitus ([place])   Cutaneous abscess of right foot    Discharge Instructions  Discharge Instructions    Diet - low sodium heart healthy   Complete by: As directed    Increase activity slowly   Complete by: As directed    Negative Pressure Wound Therapy - Incisional   Complete by: As directed    Show patient how to attach Provena pump . Will remove in office   Schedule Transitional Care Management Visit   Complete by: As directed    With PCP within 1-2 weeks     Allergies as of 02/10/2019   No Known Allergies     Medication List    STOP taking these medications   cephALEXin 500 MG capsule Commonly known as: KEFLEX     TAKE these medications   amLODipine 10 MG tablet Commonly known as: NORVASC Take 1 tablet (10 mg total) by mouth daily.   aspirin EC 81 MG EC tablet Generic drug: aspirin Take 81 mg by mouth daily. Swallow whole.   carvedilol 3.125 MG tablet Commonly known as: COREG Take 3.125 mg by mouth 2 (two) times daily with a meal.   clopidogrel 75 MG tablet Commonly known as: PLAVIX Take 1 tablet (75 mg total) by mouth daily with breakfast. Start taking on: February 11, 2019   ferrous sulfate 325 (65 FE) MG tablet Take 325 mg by mouth daily with breakfast.   furosemide 40 MG tablet Commonly known as: LASIX TAKE 1.5 TABLETS BY MOUTH DAILY. What changed: See the new instructions.   gabapentin 300 MG  capsule Commonly known as: NEURONTIN Take 1 capsule (300 mg total) by mouth 3 (three) times daily.   multivitamin with minerals Tabs tablet Take 1 tablet by mouth daily.   omega-3 acid ethyl esters 1 g capsule Commonly known as: LOVAZA TAKE ONE CAPSULE BY MOUTH EVERY DAY   rosuvastatin 10 MG tablet Commonly known as: CRESTOR Take 10 mg by mouth daily.   sacubitril-valsartan 49-51 MG Commonly  known as: ENTRESTO Take 1 tablet by mouth 2 (two) times daily.   VITAMIN B COMPLEX-C PO Take 1 tablet by mouth daily.            Durable Medical Equipment  (From admission, onward)         Start     Ordered   02/08/19 1700  For home use only DME 3 n 1  Once     02/08/19 1659         Follow-up Information    [name], MD Follow up in 1 week(s).   Specialty: Orthopedic Surgery Contact information: [place] [address] [phone]          No Known Allergies  Consultations:  Orthopedic surgery   Procedures/Studies: Dg Foot 2 Views Right  Result Date: 01/27/2019 CLINICAL DATA:  85 year old male with concern for foreign object in the foot. EXAM: RIGHT FOOT - 2 VIEW COMPARISON:  Earlier radiograph dated 01/26/2019 FINDINGS: No acute fracture or dislocation. Mild degenerative changes of the tarsal joints. There is diffuse soft tissue edema primarily involving the mid and forefoot. Soft tissue gas medial and inferior to the head of the first metatarsal similar to prior radiograph. No radiopaque foreign object. IMPRESSION: 1. No acute fracture or dislocation. No radiopaque foreign object. 2. Diffuse soft tissue edema. Soft tissue gas medial and inferior to the first metatarsal head as before. Electronically Signed   By: Anner Crete M.D.   On: 01/27/2019 04:01   Dg Foot 2 Views Right  Result Date: 01/26/2019 CLINICAL DATA:  Pain and swelling at the level of the metatarsals of the right foot following a twisting injury 1 week ago. The patient also clinically has cellulitis and an abscess of the foot. EXAM: RIGHT FOOT - 2 VIEW COMPARISON:  None. FINDINGS: Small amount of soft tissue gas medial and ventral to the distal 1st metatarsal with associated soft tissue swelling. No bone destruction or periosteal reaction. IMPRESSION: Small amount of soft tissue gas in the medial and ventral aspect of the distal 1st metatarsal with associated soft tissue  swelling, compatible with infection with a gas-forming organism or recent attempted drainage of an abscess. No underlying osteomyelitis. Electronically Signed   By: Claudie Revering M.D.   On: 01/26/2019 16:57   Dg Foot Complete Right  Result Date: 01/30/2019 CLINICAL DATA:  Foot infection EXAM: RIGHT FOOT COMPLETE - 3+ VIEW COMPARISON:  Most recent prior 01/27/2019 FINDINGS: Diffuse soft tissue swelling of the lower extremity focal soft tissue gas and ulceration along the medial aspect of the forefoot at the level of the head of the first metatarsal. New crescentic radiodensity is seen along the anterior aspect of the ulceration which could reflect a small foreign body or debris. No convincing osseous erosion, immature periostitis or subcortical lucency to suggest early features of osteomyelitis. Mild degenerative changes in the fore- and midfoot are unchanged from prior study. Plantar calcaneal spurring is noted. Vascular calcium in the soft tissues. IMPRESSION: Diffuse soft tissue swelling of the lower extremity with ulceration along the medial aspect  of the forefoot at the level of the head of the first metatarsal. No radiographic features of osteomyelitis. If there is concern clinically, MRI could be obtained. New crescentic radiodensity along the anterior aspect of the ulceration which could reflect a small foreign body or debris. Electronically Signed   By: Lovena Le M.D.   On: 01/30/2019 16:09   Vas [place] Burnard Bunting With/wo Tbi  Result Date: 01/31/2019 LOWER EXTREMITY DOPPLER STUDY Indications: Ulceration, and gangrene. High Risk Factors: Hypertension, hyperlipidemia, Diabetes.  Comparison Study: No prior studies. Performing Technologist: Carlos Levering Rvt  Examination Guidelines: A complete evaluation includes at minimum, Doppler waveform signals and systolic blood pressure reading at the level of bilateral brachial, anterior tibial, and posterior tibial arteries, when vessel segments are accessible. Bilateral  testing is considered an integral part of a complete examination. Photoelectric Plethysmograph (PPG) waveforms and toe systolic pressure readings are included as required and additional duplex testing as needed. Limited examinations for reoccurring indications may be performed as noted.  ABI Findings: +---------+------------------+-----+----------+--------+ Right    Rt Pressure (mmHg)IndexWaveform  Comment  +---------+------------------+-----+----------+--------+ Brachial 127                    triphasic          +---------+------------------+-----+----------+--------+ PTA      111               0.80 monophasic         +---------+------------------+-----+----------+--------+ DP       95                0.68 monophasic         +---------+------------------+-----+----------+--------+ Great Toe55                0.40                    +---------+------------------+-----+----------+--------+ +---------+------------------+-----+----------+-------+ Left     Lt Pressure (mmHg)IndexWaveform  Comment +---------+------------------+-----+----------+-------+ Brachial 139                    triphasic         +---------+------------------+-----+----------+-------+ PTA      114               0.82 monophasic        +---------+------------------+-----+----------+-------+ DP       89                0.64 monophasic        +---------+------------------+-----+----------+-------+ Great Toe83                0.60                   +---------+------------------+-----+----------+-------+ +-------+-----------+-----------+------------+------------+ ABI/TBIToday's ABIToday's TBIPrevious ABIPrevious TBI +-------+-----------+-----------+------------+------------+ Right  0.8        0.4                                 +-------+-----------+-----------+------------+------------+ Left   0.82       0.6                                  +-------+-----------+-----------+------------+------------+  Summary: Right: Resting right ankle-brachial index indicates mild right lower extremity arterial disease. The right toe-brachial index is abnormal. Left: Resting left ankle-brachial index indicates mild left lower extremity arterial disease. The left toe-brachial index is abnormal.  *  See table(s) above for measurements and observations.  Electronically signed by Harold Barban MD on 01/31/2019 at 5:40:47 PM.    Final    Vas [place] Lower Extremity Venous (dvt) (only [place])  Result Date: 01/27/2019  Lower Venous Study Indications: Edema, and Swelling.  Comparison Study: no prior Performing Technologist: Abram Sander RVS  Examination Guidelines: A complete evaluation includes B-mode imaging, spectral Doppler, color Doppler, and power Doppler as needed of all accessible portions of each vessel. Bilateral testing is considered an integral part of a complete examination. Limited examinations for reoccurring indications may be performed as noted.  +---------+---------------+---------+-----------+----------+--------------+ RIGHT    CompressibilityPhasicitySpontaneityPropertiesThrombus Aging +---------+---------------+---------+-----------+----------+--------------+ CFV      Full           Yes      Yes                                 +---------+---------------+---------+-----------+----------+--------------+ SFJ      Full                                                        +---------+---------------+---------+-----------+----------+--------------+ FV Prox  Full                                                        +---------+---------------+---------+-----------+----------+--------------+ FV Mid   Full                                                        +---------+---------------+---------+-----------+----------+--------------+ FV DistalFull                                                         +---------+---------------+---------+-----------+----------+--------------+ PFV      Full                                                        +---------+---------------+---------+-----------+----------+--------------+ POP      Full           Yes      Yes                                 +---------+---------------+---------+-----------+----------+--------------+ PTV      Full                                                        +---------+---------------+---------+-----------+----------+--------------+ PERO  Full                                                        +---------+---------------+---------+-----------+----------+--------------+   +----+---------------+---------+-----------+----------+--------------+ LEFTCompressibilityPhasicitySpontaneityPropertiesThrombus Aging +----+---------------+---------+-----------+----------+--------------+ CFV                                              Not visualized +----+---------------+---------+-----------+----------+--------------+     Summary: Right: There is no evidence of deep vein thrombosis in the lower extremity. No cystic structure found in the popliteal fossa.  *See table(s) above for measurements and observations. Electronically signed by Deitra Mayo MD on 01/27/2019 at 2:21:23 PM.    Final      Subjective: Patient denies any acute complaints and is excited about being discharged home today.  Discharge Exam: Vitals:   02/10/19 0900 02/10/19 1300  BP: 136/68   Pulse: (!) 55 (!) 54  Resp: 10 14  Temp: 98 F (36.7 C)   SpO2: 97%    Vitals:   02/10/19 0409 02/10/19 0412 02/10/19 0900 02/10/19 1300  BP: 138/73  136/68   Pulse:   (!) 55 (!) 54  Resp: 12  10 14   Temp: 98.1 F (36.7 C)  98 F (36.7 C)   TempSrc: Oral  Oral   SpO2: 92%  97%   Weight:  99.5 kg    Height:        General exam: Appears calm and comfortable  Respiratory system: Clear to auscultation. Respiratory effort  normal. Cardiovascular system: S1 & S2 heard, RRR. No JVD, murmurs, rubs, gallops or clicks. No pedal edema. Gastrointestinal system: Abdomen is nondistended, soft and nontender. No organomegaly or masses felt. Normal bowel sounds heard. Central nervous system: Alert and oriented. No focal neurological deficits. Extremities: Symmetric 5 x 5 power. Skin: Right lower extremity dressing in place. Psychiatry: Judgement and insight appear normal. Mood & affect appropriate.   The results of significant diagnostics from this hospitalization (including imaging, microbiology, ancillary and laboratory) are listed below for reference.     Microbiology: Recent Results (from the past 240 hour(s))  MRSA PCR Screening     Status: None   Collection Time: 02/02/19  6:53 PM   Specimen: Nasal Mucosa; Nasopharyngeal  Result Value Ref Range Status   MRSA by PCR NEGATIVE NEGATIVE Final    Comment:        The GeneXpert MRSA Assay (FDA approved for NASAL specimens only), is one component of a comprehensive MRSA colonization surveillance program. It is not intended to diagnose MRSA infection nor to guide or monitor treatment for MRSA infections. Performed at [facility], [place] [address] [place], [place] [address]      Labs: BNP (last 3 results) Recent Labs    03/06/18 2349  BNP [id number]*   Basic Metabolic Panel: Recent Labs  Lab 02/04/19 0236 02/05/19 0233 02/06/19 0227 02/08/19 0211 02/09/19 0257 02/10/19 0242  NA 135 138 138 139 141 140  K 4.4 4.4 4.9 4.4 4.2 4.4  CL 105 107 105 105 107 107  CO2 23 25 27 27 25 25   GLUCOSE 158* 133* 112* 114* 107* 113*  BUN 16 17 17 19 19 17   CREATININE 1.45* 1.37* 1.32* 1.27* 1.21 1.38*  CALCIUM 8.5* 8.4* 8.7* 8.7* 8.9 8.9  MG 2.3 2.3 2.1  --   --   --   PHOS 3.8 3.8 3.9  --   --   --    Liver Function Tests: Recent Labs  Lab 02/04/19 0236 02/05/19 0233 02/06/19 0227  AST 24 27 23   ALT 31 30 28   ALKPHOS 121 107 106  BILITOT 0.4 0.2*  0.7  PROT 6.5 6.8 6.9  ALBUMIN 2.2* 2.2* 2.3*   No results for input(s): LIPASE, AMYLASE in the last 168 hours. No results for input(s): AMMONIA in the last 168 hours. CBC: Recent Labs  Lab 02/05/19 0233 02/06/19 0227 02/08/19 0211 02/09/19 0257 02/10/19 0242  WBC 9.3 9.6 7.4 6.6 6.0  HGB 8.7* 8.8* 8.5* 8.7* 8.7*  HCT 27.7* 28.0* 26.9* 27.1* 27.6*  MCV 93.9 94.3 92.4 92.8 93.6  PLT 304 324 325 325 336   Cardiac Enzymes: No results for input(s): CKTOTAL, CKMB, CKMBINDEX, TROPONINI in the last 168 hours. BNP: Invalid input(s): POCBNP CBG: Recent Labs  Lab 02/04/19 1102 02/04/19 1606 02/05/19 1106 02/05/19 1606 02/06/19 1153  GLUCAP 159* 128* 94 138* 103*   D-Dimer No results for input(s): DDIMER in the last 72 hours. Hgb A1c No results for input(s): HGBA1C in the last 72 hours. Lipid Profile No results for input(s): CHOL, HDL, LDLCALC, TRIG, CHOLHDL, LDLDIRECT in the last 72 hours. Thyroid function studies No results for input(s): TSH, T4TOTAL, T3FREE, THYROIDAB in the last 72 hours.  Invalid input(s): FREET3 Anemia work up No results for input(s): VITAMINB12, FOLATE, FERRITIN, TIBC, IRON, RETICCTPCT in the last 72 hours. Urinalysis    Component Value Date/Time   COLORURINE YELLOW 02/02/2019 0802   APPEARANCEUR CLEAR 02/02/2019 0802   LABSPEC 1.015 02/02/2019 0802   PHURINE 5.0 02/02/2019 0802   GLUCOSEU NEGATIVE 02/02/2019 0802   HGBUR NEGATIVE 02/02/2019 0802   BILIRUBINUR NEGATIVE 02/02/2019 0802   KETONESUR NEGATIVE 02/02/2019 0802   PROTEINUR NEGATIVE 02/02/2019 0802   NITRITE NEGATIVE 02/02/2019 0802   LEUKOCYTESUR NEGATIVE 02/02/2019 0802   Sepsis Labs Invalid input(s): PROCALCITONIN,  WBC,  LACTICIDVEN Microbiology Recent Results (from the past 240 hour(s))  MRSA PCR Screening     Status: None   Collection Time: 02/02/19  6:53 PM   Specimen: Nasal Mucosa; Nasopharyngeal  Result Value Ref Range Status   MRSA by PCR NEGATIVE NEGATIVE Final     Comment:        The GeneXpert MRSA Assay (FDA approved for NASAL specimens only), is one component of a comprehensive MRSA colonization surveillance program. It is not intended to diagnose MRSA infection nor to guide or monitor treatment for MRSA infections. Performed at [facility], [place] [address], [place], [place] [address]      Time coordinating discharge: Over 30 minutes  SIGNED:   [name], MD  [facility] 02/10/2019, 3:13 PM   If 7PM-7AM, please contact night-coverage [email or web address] Password [email or web address]

## 2019-02-10 NOTE — Progress Notes (Signed)
Doing well denies Pain. Feels PT is "rough" Vss. Dressing changed. No evidence of cellulitis. No foul Odor. There is some superficial wound dehiscence on the distal 80% of the wound. It is about 1 cm. The wound edges do appear healthy and centrally there is healthy vascularized granulation tissue between the wound edges. Encourage elevation.  Will need follow up with Dr. Sharol Given next week

## 2019-02-10 NOTE — TOC Progression Note (Signed)
Transition of Care (TOC) - Progression Note  Marvetta Gibbons RN, BSN Transitions of Care Unit 4E- RN Case Manager [phone]   Patient Details  Name: Robert Horn MRN: [id number] Date of Birth: Dec 30, 1933  Transition of Care [facility]) CM/SW Contact  Dahlia Client, Romeo Rabon, RN Phone Number: 02/10/2019, 5:32 PM  Clinical Narrative:    Working on getting pt home today, per RN at bedside wife has confirmed they have 3n1 at home, need w/c for home- MD has been notified for order. Call made to [facility] with [employer]- however they do not have any w/c the size pt needs- call made to [facility]- which can deliver a w/c to the room tonight prior to discharge- need to f/u with wife for [facility] choice.      Barriers to Discharge: No Barriers Identified  Expected Discharge Plan and Services           Expected Discharge Date: 02/10/19               DME Arranged: [place] Rile rolling, Wheelchair manual DME Agency: ([facility])       [place] Arranged: PT HH Agency: [place] Date [place]: 02/10/19 Time [place]: 1656 Representative spoke with at [place]: [place] ([place]) Interventions    Readmission Risk Interventions No flowsheet data found.

## 2019-02-10 NOTE — TOC Transition Note (Signed)
Transition of Care [facility]) - CM/SW Discharge Note   Patient Details  Name: Robert Horn MRN: [id number] Date of Birth: 10/04/1933  Transition of Care [facility]) CM/SW Contact:  Zenon Mayo, RN Phone Number: 02/10/2019, 4:56 PM   Clinical Narrative:    NCM spoke with patient wife, she chose [facility] and [facility] from list, but neither one could take referral, NCM made referral to [facility] for [place] with Tommi Rumps he states soc will be Monday or Tuesday.  Wife states she is fine with [facility].  DME is set up with [facility].   Final next level of care: [place] Barriers to Discharge: No Barriers Identified   Patient Goals and CMS Choice Patient states their goals for this hospitalization and ongoing recovery are:: go home CMS Medicare.gov Compare Post Acute Care list provided to:: Patient Represenative (must comment)(wife) Choice offered to / list presented to : Spouse  Discharge Placement                       Discharge Plan and Services                DME Arranged: Walker rolling, Wheelchair manual DME Agency: ([facility])       [place] Arranged: PT HH Agency: [place] Date [place]: 02/10/19 Time [place]: 1656 Representative spoke with at [place]: [place] ([place]) Interventions     Readmission Risk Interventions No flowsheet data found.

## 2019-02-10 NOTE — Progress Notes (Signed)
Patient suffers from first ray amputation of right foot which impairs their ability to perform daily activities like ambulate and negotiate stairs in the home.  A walker alone will not resolve the issues with performing activities of daily living. A wheelchair will allow patient to safely perform daily activities.  The patient can self propel in the home or has a caregiver who can provide assistance.    Earney Navy, PTA Acute Rehabilitation Services Pager: [phone] Office: [phone]

## 2019-02-10 NOTE — Telephone Encounter (Signed)
Lauren was called and informed that patient do not need any antibiotics to go home with at this time and will need to follow up in our office next week.

## 2019-02-10 NOTE — Telephone Encounter (Signed)
Robert Horn with [facility] needs to speak with you again.  CB#[phone].  Thank you.

## 2019-02-14 ENCOUNTER — Telehealth: Payer: Self-pay

## 2019-02-14 NOTE — Telephone Encounter (Signed)
[facility] called and is asking what wound care orders are for the pt. He is s/p a 1st ray amputation and per the d/c paperwork from the hospital pt should be wearing a preveena wound vac and this should remain intact until his post op appt with [place] Thursday. To call back if the pump is alarming or is no longer attached then to do a dry dressing change daily until appt on Thursday. To call back with any questions.

## 2019-02-15 NOTE — Progress Notes (Signed)
Cardiology Office Note:    Date:  02/16/2019   ID:  Robert Horn, DOB 08-27-1933, MRN [id number]  PCP:  [name], MD  Cardiologist:  Sinclair Grooms, MD   Referring MD: [name], MD   Chief Complaint  Patient presents with  . Congestive Heart Failure  . Cardiac Valve Problem  . Irregular Heart Beat    History of Present Illness:    Robert Horn is a 85 y.o. male with a hx of chronic combined S/D CHF, HTN, HLD, colon cancerand severe aortic stenosis s/p TAVR (12/15/16).EF was down, 25-30,in setting of severe aortic stenosis. About a monthafter TAVR echocardiogram showed improvement in EF to 45-50% with mean gradient of 12 mmmHg.Most recent LVEF 35 to 40%.   Unfortunately, Robert Horn is status post amputation of his right great toe after he developed nonhealing ulcer and skin necrosis.  This was performed by Dr. Sharol Given.  The problem developed after he injured his toe by stepping in a hole.  He then played golf and its wall and became discolored.  1-1/2 to 2 days later the skin broke and infection and sloughed.  He was discharged from the hospital on his typical cardiac therapy and also had pentoxifylline, Plavix, and cephalexin started.  Past Medical History:  Diagnosis Date  . CHF (congestive heart failure) ([place])   . Dyslipidemia 10/27/2015  . Dyspnea    w/ exertion   . Elevated PSA 10/27/2015  . Erectile dysfunction 10/27/2015  . Heart murmur   . Hypertension 10/27/2015  . Hypogonadism male 10/27/2015  . Obesity 10/27/2015  . Pneumonia 10/27/2015   pt states was 1982  . Rotator cuff tear 10/27/2015    Past Surgical History:  Procedure Laterality Date  . ABDOMINAL AORTOGRAM N/A 02/01/2019   Procedure: ABDOMINAL AORTOGRAM;  Surgeon: Marty Heck, MD;  Location: [facility];  Service: Cardiovascular;  Laterality: N/A;  . AMPUTATION Right 02/03/2019   Procedure: RIGHT FOOT 1ST RAY AMPUTATION;  Surgeon: [name], MD;  Location: [facility];  Service: Orthopedics;  Laterality: Right;  . CARDIAC CATHETERIZATION N/A 11/14/2015   Procedure: Left Heart Cath and Coronary Angiography;  Surgeon: [name], MD;  Location: [facility];  Service: Cardiovascular;  Laterality: N/A;  . LAPAROSCOPIC PARTIAL COLECTOMY  12/15/2017   LAPAROSCOPIC PARTIAL COLECTOMY (N/A Abdomen)  . LAPAROSCOPIC PARTIAL COLECTOMY N/A 12/15/2017   Procedure: LAPAROSCOPIC PARTIAL COLECTOMY;  Surgeon: Erroll Luna, MD;  Location: [facility];  Service: General;  Laterality: N/A;  . LOWER EXTREMITY ANGIOGRAPHY Right 02/01/2019   Procedure: LOWER EXTREMITY ANGIOGRAPHY;  Surgeon: Marty Heck, MD;  Location: [facility];  Service: Cardiovascular;  Laterality: Right;  . PERIPHERAL VASCULAR INTERVENTION Right 02/01/2019   Procedure: PERIPHERAL VASCULAR INTERVENTION;  Surgeon: Marty Heck, MD;  Location: [facility];  Service: Cardiovascular;  Laterality: Right;  SFA  . TEE WITHOUT CARDIOVERSION N/A 12/15/2016   Procedure: TRANSESOPHAGEAL ECHOCARDIOGRAM (TEE);  Surgeon: Sherren Mocha, MD;  Location: [facility];  Service: Open Heart Surgery;  Laterality: N/A;  . TRANSCATHETER AORTIC VALVE REPLACEMENT, TRANSFEMORAL N/A 12/15/2016   Procedure: TRANSCATHETER AORTIC VALVE REPLACEMENT, TRANSFEMORAL;  Surgeon: Sherren Mocha, MD;  Location: [facility];  Service: Open Heart Surgery;  Laterality: N/A;    Current Medications: Current Meds  Medication Sig  . amLODipine (NORVASC) 10 MG tablet Take 1 tablet (10 mg total) by mouth daily.  Marland Kitchen aspirin (ASPIRIN EC) 81 MG EC tablet Take 81 mg by mouth daily.  Swallow whole.  . carvedilol (COREG) 3.125 MG tablet Take 3.125 mg by mouth 2 (two) times daily with a meal.  . clopidogrel (PLAVIX) 75 MG tablet Take 1 tablet (75 mg total) by mouth daily with breakfast.  . ferrous sulfate 325 (65 FE) MG tablet Take 325 mg by mouth daily with breakfast.  . furosemide (LASIX) 40 MG tablet TAKE 1.5 TABLETS BY MOUTH DAILY.  Marland Kitchen  gabapentin (NEURONTIN) 300 MG capsule Take 1 capsule (300 mg total) by mouth 3 (three) times daily.  . Multiple Vitamin (MULTIVITAMIN WITH MINERALS) TABS tablet Take 1 tablet by mouth daily.  . nitroGLYCERIN (NITRODUR - DOSED IN MG/24 HR) 0.2 mg/hr patch Place 1 patch (0.2 mg total) onto the skin daily.  Marland Kitchen omega-3 acid ethyl esters (LOVAZA) 1 g capsule TAKE ONE CAPSULE BY MOUTH EVERY DAY  . pentoxifylline (TRENTAL) 400 MG CR tablet Take 1 tablet (400 mg total) by mouth 3 (three) times daily with meals.  . rosuvastatin (CRESTOR) 10 MG tablet Take 10 mg by mouth daily.  . sacubitril-valsartan (ENTRESTO) 49-51 MG Take 1 tablet by mouth 2 (two) times daily.  Marland Kitchen VITAMIN B COMPLEX-C PO Take 1 tablet by mouth daily.     Allergies:   Patient has no known allergies.   Social History   Socioeconomic History  . Marital status: Single    Spouse name: Not on file  . Number of children: Not on file  . Years of education: Not on file  . Highest education level: Not on file  Occupational History  . Not on file  Social Needs  . Financial resource strain: Not on file  . Food insecurity    Worry: Not on file    Inability: Not on file  . Transportation needs    Medical: Not on file    Non-medical: Not on file  Tobacco Use  . Smoking status: Former Smoker    Types: Cigarettes  . Smokeless tobacco: Never Used  Substance and Sexual Activity  . Alcohol use: Yes    Comment: rarely  . Drug use: No  . Sexual activity: Not on file  Lifestyle  . Physical activity    Days per week: Not on file    Minutes per session: Not on file  . Stress: Not on file  Relationships  . Social Herbalist on phone: Not on file    Gets together: Not on file    Attends religious service: Not on file    Active member of club or organization: Not on file    Attends meetings of clubs or organizations: Not on file    Relationship status: Not on file  Other Topics Concern  . Not on file  Social History  Narrative  . Not on file     Family History: The patient's family history includes Diabetes in his mother; Heart disease in his mother; Pulmonary embolism in his father.  ROS:   Please see the history of present illness.    Increased urinary frequency all other systems reviewed and are negative.  EKGs/Labs/Other Studies Reviewed:    The following studies were reviewed today 2D Doppler echocardiogram 02/01/2019 IMPRESSIONS    1. Left ventricular ejection fraction, by visual estimation, is 45 to 50%. The left ventricle has mildly decreased function. There is no left ventricular hypertrophy.  2. Elevated left ventricular end-diastolic pressure.  3. Left ventricular diastolic parameters are consistent with Grade II diastolic dysfunction (pseudonormalization).  4. Global right ventricle has normal  systolic function.The right ventricular size is normal. No increase in right ventricular wall thickness.  5. Left atrial size was mildly dilated.  6. Right atrial size was normal.  7. Mild mitral annular calcification.  8. The mitral valve is normal in structure. Trace mitral valve regurgitation. No evidence of mitral stenosis.  9. The tricuspid valve is normal in structure. Tricuspid valve regurgitation is not demonstrated. 10. Edwards Sapien bioprosthetic, stented aortic valve (TAVR) valve is present in the aortic position. Aortic valve mean gradient measures 27.0 mmHg. Aortic valve peak gradient measures 48.3 mmHg. Dimensionless index 0.36. Peak velocity 386cm/sec. AVA  1.36cm2. There is no perivalvular AI. 11. The pulmonic valve was normal in structure. Pulmonic valve regurgitation is trivial. 12. The inferior vena cava is normal in size with greater than 50% respiratory variability, suggesting right atrial pressure of 3 mmHg.  EKG:  EKG normal sinus rhythm, vertical axis, incomplete right bundle, PVCs.  EKG performed February 2020  Recent Labs: 03/06/2018: B Natriuretic Peptide 633.8  11/24/2018: NT-Pro BNP 294 02/06/2019: ALT 28; Magnesium 2.1 02/10/2019: BUN 17; Creatinine, Ser 1.38; Hemoglobin 8.7; Platelets 336; Potassium 4.4; Sodium 140  Recent Lipid Panel    Component Value Date/Time   CHOL 110 01/30/2019 2350   TRIG 87 01/30/2019 2350   HDL 22 (L) 01/30/2019 2350   CHOLHDL 5.0 01/30/2019 2350   VLDL 17 01/30/2019 2350   LDLCALC 71 01/30/2019 2350    Physical Exam:    VS:  BP 126/62   Pulse (!) 49   Ht 6' (1.829 m)   SpO2 97%   BMI 29.75 kg/m     Wt Readings from Last 3 Encounters:  02/10/19 219 lb 5.7 oz (99.5 kg)  01/27/19 226 lb (102.5 kg)  10/24/18 224 lb 8 oz (101.8 kg)     GEN: He has lost weight.. No acute distress HEENT: Normal NECK: No JVD. LYMPHATICS: No lymphadenopathy CARDIAC: Irregular RR soft systolic right upper sternal border murmur.  There is no diastolic murmur, gallop, or edema. VASCULAR:  Normal Pulses. No bruits.  The right great toe is amputated and bandaged. RESPIRATORY:  Clear to auscultation without rales, wheezing or rhonchi  ABDOMEN: Soft, non-tender, non-distended, No pulsatile mass, MUSCULOSKELETAL: No deformity  SKIN: Warm and dry NEUROLOGIC:  Alert and oriented x 3 PSYCHIATRIC:  Normal affect   ASSESSMENT:    1. S/P TAVR (transcatheter aortic valve replacement)   2. Chronic combined systolic and diastolic heart failure ([place])   3. Essential hypertension   4. CKD (chronic kidney disease) stage 2, GFR 60-89 ml/min   5. Mixed hyperlipidemia   6. Educated about COVID-19 virus infection    PLAN:    In order of problems listed above:  1. Continued normal functioning TAVR aortic valve.  No clinical evidence to this point of valve infection despite the right lower extremity necrotic toe that was resected.  He is on cephalexin currently. 2. No evidence of volume overload. 3. Excellent blood pressure control. 4. Kidney function is mildly decreased with creatinine of 1.63. 5. Lipid status is adequately controlled  6. The 3W's is being practiced to avoid COVID-19 infection.  4 to 65-month follow-up.  Continue current therapy including pentoxifylline and Plavix.   Medication Adjustments/Labs and Tests Ordered: Current medicines are reviewed at length with the patient today.  Concerns regarding medicines are outlined above.  No orders of the defined types were placed in this encounter.  No orders of the defined types were placed in this encounter.  Patient Instructions  Medication Instructions:  Your physician recommends that you continue on your current medications as directed. Please refer to the Current Medication list given to you today.  *If you need a refill on your cardiac medications before your next appointment, please call your pharmacy*  Lab Work: None If you have labs (blood work) drawn today and your tests are completely normal, you will receive your results only by: Marland Kitchen MyChart Message (if you have MyChart) OR . A paper copy in the mail If you have any lab test that is abnormal or we need to change your treatment, we will call you to review the results.  Testing/Procedures: None  Follow-Up: At [facility], you and your health needs are our priority.  As part of our continuing mission to provide you with exceptional heart care, we have created designated Provider Care Teams.  These Care Teams include your primary Cardiologist (physician) and Advanced Practice Providers (APPs -  Physician Assistants and Nurse Practitioners) who all work together to provide you with the care you need, when you need it.  Your next appointment:   4-6 month(s)  The format for your next appointment:   In Person  Provider:   You may see Sinclair Grooms, MD or one of the following Advanced Practice Providers on your designated Care Team:    Truitt Merle, NP  Cecilie Kicks, NP  Kathyrn Drown, NP   Other Instructions      Signed, Sinclair Grooms, MD  02/16/2019 4:30 PM    [place]

## 2019-02-16 ENCOUNTER — Ambulatory Visit: Payer: Medicare Other | Admitting: Interventional Cardiology

## 2019-02-16 ENCOUNTER — Ambulatory Visit (INDEPENDENT_AMBULATORY_CARE_PROVIDER_SITE_OTHER): Payer: Medicare Other | Admitting: Physician Assistant

## 2019-02-16 ENCOUNTER — Encounter: Payer: Self-pay | Admitting: Interventional Cardiology

## 2019-02-16 ENCOUNTER — Other Ambulatory Visit: Payer: Self-pay

## 2019-02-16 VITALS — BP 126/62 | HR 49 | Ht 72.0 in

## 2019-02-16 DIAGNOSIS — I5042 Chronic combined systolic (congestive) and diastolic (congestive) heart failure: Secondary | ICD-10-CM | POA: Diagnosis not present

## 2019-02-16 DIAGNOSIS — I110 Hypertensive heart disease with heart failure: Secondary | ICD-10-CM

## 2019-02-16 DIAGNOSIS — Z952 Presence of prosthetic heart valve: Secondary | ICD-10-CM | POA: Diagnosis not present

## 2019-02-16 DIAGNOSIS — E782 Mixed hyperlipidemia: Secondary | ICD-10-CM

## 2019-02-16 DIAGNOSIS — N182 Chronic kidney disease, stage 2 (mild): Secondary | ICD-10-CM

## 2019-02-16 DIAGNOSIS — M869 Osteomyelitis, unspecified: Secondary | ICD-10-CM

## 2019-02-16 DIAGNOSIS — I10 Essential (primary) hypertension: Secondary | ICD-10-CM

## 2019-02-16 DIAGNOSIS — Z7189 Other specified counseling: Secondary | ICD-10-CM

## 2019-02-16 MED ORDER — NITROGLYCERIN 0.2 MG/HR TD PT24: 0.2000 mg | MEDICATED_PATCH | Freq: Every day | TRANSDERMAL | 12 refills | Status: DC

## 2019-02-16 MED ORDER — PENTOXIFYLLINE ER 400 MG PO TBCR: 400.0000 mg | EXTENDED_RELEASE_TABLET | Freq: Three times a day (TID) | ORAL | 3 refills | Status: DC

## 2019-02-16 NOTE — Patient Instructions (Signed)
Medication Instructions:  Your physician recommends that you continue on your current medications as directed. Please refer to the Current Medication list given to you today.  *If you need a refill on your cardiac medications before your next appointment, please call your pharmacy*  Lab Work: None If you have labs (blood work) drawn today and your tests are completely normal, you will receive your results only by: Marland Kitchen MyChart Message (if you have MyChart) OR . A paper copy in the mail If you have any lab test that is abnormal or we need to change your treatment, we will call you to review the results.  Testing/Procedures: None  Follow-Up: At [facility], you and your health needs are our priority.  As part of our continuing mission to provide you with exceptional heart care, we have created designated Provider Care Teams.  These Care Teams include your primary Cardiologist (physician) and Advanced Practice Providers (APPs -  Physician Assistants and Nurse Practitioners) who all work together to provide you with the care you need, when you need it.  Your next appointment:   4-6 month(s)  The format for your next appointment:   In Person  Provider:   You may see Sinclair Grooms, MD or one of the following Advanced Practice Providers on your designated Care Team:    Truitt Merle, NP  Cecilie Kicks, NP  Kathyrn Drown, NP   Other Instructions

## 2019-02-17 ENCOUNTER — Encounter: Payer: Self-pay | Admitting: Physician Assistant

## 2019-02-17 NOTE — Progress Notes (Signed)
Office Visit Note   Patient: Robert Horn           Date of Birth: January 11, 1934           MRN: [id number] Visit Date: 02/16/2019              Requested by: [name], MD [address] [place],  [place] [address] PCP: [name], MD  Chief Complaint  Patient presents with  . Right Foot - Routine Post Op    02/03/2019 right foot 1st      HPI: This is a pleasant 85 year old gentleman who is 2 weeks status post right first ray amputation.  He is doing well without any concerns.  He does not have any pain.  He is accompanied by his wife  Assessment & Plan: Visit Diagnoses: No diagnosis found.  Plan: The patient was seen today with Dr. Sharol Given.  We would like for him to complain VIVE compression socks we would like for him to wear them all the time changing them once every 24 hours and putting on a clean pair he may wash his foot but not soak it.  We have also given him a prescription for Trental and nitro glycerin patches and discussed how to use these. he will follow-up in 1 week  Follow-Up Instructions: No follow-ups on file.   Ortho Exam  Patient is alert, oriented, no adenopathy, well-dressed, normal affect, normal respiratory effort. Right foot: Surgical incision is healing there is one small area of dehiscence but improved since examination previously it has good central granulation tissue and healthy wound edges  Imaging: No results found. No images are attached to the encounter.  Labs: Lab Results  Component Value Date   HGBA1C 7.0 (H) 01/31/2019   HGBA1C 6.4 (H) 12/13/2017   HGBA1C 6.4 (H) 12/09/2016   REPTSTATUS 02/04/2019 FINAL 01/30/2019   CULT  01/30/2019    NO GROWTH 5 DAYS Performed at [facility], [place] [address], [place], [place] [address]      Lab Results  Component Value Date   ALBUMIN 2.3 (L) 02/06/2019   ALBUMIN 2.2 (L) 02/05/2019   ALBUMIN 2.2 (L) 02/04/2019    Lab Results  Component Value Date   MG 2.1 02/06/2019    MG 2.3 02/05/2019   MG 2.3 02/04/2019   No results found for: VD25OH  No results found for: PREALBUMIN CBC EXTENDED Latest Ref Rng & Units 02/10/2019 02/09/2019 02/08/2019  WBC 4.0 - 10.5 K/uL 6.0 6.6 7.4  RBC 4.22 - 5.81 MIL/uL 2.95(L) 2.92(L) 2.91(L)  HGB 13.0 - 17.0 g/dL 8.7(L) 8.7(L) 8.5(L)  HCT 39.0 - 52.0 % 27.6(L) 27.1(L) 26.9(L)  PLT 150 - 400 K/uL 336 325 325  NEUTROABS 1.7 - 7.7 K/uL - - -  LYMPHSABS 0.7 - 4.0 K/uL - - -     There is no height or weight on file to calculate BMI.  Orders:  No orders of the defined types were placed in this encounter.  Meds ordered this encounter  Medications  . pentoxifylline (TRENTAL) 400 MG CR tablet    Sig: Take 1 tablet (400 mg total) by mouth 3 (three) times daily with meals.    Dispense:  90 tablet    Refill:  3  . nitroGLYCERIN (NITRODUR - DOSED IN MG/24 HR) 0.2 mg/hr patch    Sig: Place 1 patch (0.2 mg total) onto the skin daily.    Dispense:  30 patch    Refill:  12  Procedures: No procedures performed  Clinical Data: No additional findings.  ROS:  All other systems negative, except as noted in the HPI. Review of Systems  Objective: Vital Signs: There were no vitals taken for this visit.  Specialty Comments:  No specialty comments available.  PMFS History: Patient Active Problem List   Diagnosis Date Noted  . Osteomyelitis of great toe of right foot ([place])   . Severe protein-calorie malnutrition ([place])   . Diabetic polyneuropathy associated with type 2 diabetes mellitus ([place])   . Cutaneous abscess of right foot   . Cellulitis of right foot 01/30/2019  . CKD (chronic kidney disease), stage III 01/30/2019  . Elevated LFTs 01/30/2019  . Colon cancer ([place]) 01/30/2019  . Anemia of chronic disease 01/30/2019  . Acute on chronic combined systolic and diastolic CHF (congestive heart failure) ([place]) 03/09/2018  . AKI (acute kidney injury) ([place]) 03/09/2018  . Colonic mass 12/10/2017  . S/P TAVR  (transcatheter aortic valve replacement) 03/03/2017  . Severe aortic stenosis 12/15/2016  . Essential hypertension 05/09/2013  . Hyperlipidemia 05/09/2013  . PVC's (premature ventricular contractions) 05/09/2013  . Chronic combined systolic and diastolic heart failure ([place]) 05/09/2013   Past Medical History:  Diagnosis Date  . CHF (congestive heart failure) ([place])   . Dyslipidemia 10/27/2015  . Dyspnea    w/ exertion   . Elevated PSA 10/27/2015  . Erectile dysfunction 10/27/2015  . Heart murmur   . Hypertension 10/27/2015  . Hypogonadism male 10/27/2015  . Obesity 10/27/2015  . Pneumonia 10/27/2015   pt states was 1982  . Rotator cuff tear 10/27/2015    Family History  Problem Relation Age of Onset  . Diabetes Mother   . Heart disease Mother   . Pulmonary embolism Father     Past Surgical History:  Procedure Laterality Date  . ABDOMINAL AORTOGRAM N/A 02/01/2019   Procedure: ABDOMINAL AORTOGRAM;  Surgeon: Marty Heck, MD;  Location: [facility];  Service: Cardiovascular;  Laterality: N/A;  . AMPUTATION Right 02/03/2019   Procedure: RIGHT FOOT 1ST RAY AMPUTATION;  Surgeon: [name], MD;  Location: [facility];  Service: Orthopedics;  Laterality: Right;  . CARDIAC CATHETERIZATION N/A 11/14/2015   Procedure: Left Heart Cath and Coronary Angiography;  Surgeon: [name], MD;  Location: [facility];  Service: Cardiovascular;  Laterality: N/A;  . LAPAROSCOPIC PARTIAL COLECTOMY  12/15/2017   LAPAROSCOPIC PARTIAL COLECTOMY (N/A Abdomen)  . LAPAROSCOPIC PARTIAL COLECTOMY N/A 12/15/2017   Procedure: LAPAROSCOPIC PARTIAL COLECTOMY;  Surgeon: Erroll Luna, MD;  Location: [facility];  Service: General;  Laterality: N/A;  . LOWER EXTREMITY ANGIOGRAPHY Right 02/01/2019   Procedure: LOWER EXTREMITY ANGIOGRAPHY;  Surgeon: Marty Heck, MD;  Location: [facility];  Service: Cardiovascular;  Laterality: Right;  . PERIPHERAL VASCULAR INTERVENTION Right  02/01/2019   Procedure: PERIPHERAL VASCULAR INTERVENTION;  Surgeon: Marty Heck, MD;  Location: [facility];  Service: Cardiovascular;  Laterality: Right;  SFA  . TEE WITHOUT CARDIOVERSION N/A 12/15/2016   Procedure: TRANSESOPHAGEAL ECHOCARDIOGRAM (TEE);  Surgeon: Sherren Mocha, MD;  Location: [facility];  Service: Open Heart Surgery;  Laterality: N/A;  . TRANSCATHETER AORTIC VALVE REPLACEMENT, TRANSFEMORAL N/A 12/15/2016   Procedure: TRANSCATHETER AORTIC VALVE REPLACEMENT, TRANSFEMORAL;  Surgeon: Sherren Mocha, MD;  Location: [facility];  Service: Open Heart Surgery;  Laterality: N/A;   Social History   Occupational History  . Not on file  Tobacco Use  . Smoking status: Former Smoker    Types:  Cigarettes  . Smokeless tobacco: Never Used  Substance and Sexual Activity  . Alcohol use: Yes    Comment: rarely  . Drug use: No  . Sexual activity: Not on file

## 2019-02-27 ENCOUNTER — Ambulatory Visit (INDEPENDENT_AMBULATORY_CARE_PROVIDER_SITE_OTHER): Payer: Medicare Other | Admitting: Orthopedic Surgery

## 2019-02-27 ENCOUNTER — Other Ambulatory Visit: Payer: Self-pay

## 2019-02-27 ENCOUNTER — Encounter: Payer: Self-pay | Admitting: Orthopedic Surgery

## 2019-02-27 VITALS — Ht 72.0 in | Wt 219.0 lb

## 2019-02-27 DIAGNOSIS — T8789 Other complications of amputation stump: Secondary | ICD-10-CM

## 2019-02-27 DIAGNOSIS — T8189XA Other complications of procedures, not elsewhere classified, initial encounter: Secondary | ICD-10-CM

## 2019-02-28 ENCOUNTER — Encounter: Payer: Self-pay | Admitting: Orthopedic Surgery

## 2019-02-28 NOTE — Progress Notes (Signed)
Office Visit Note   Patient: Robert Horn           Date of Birth: 1933/06/09           MRN: [id number] Visit Date: 02/27/2019              Requested by: [name], MD [address] [place] [address] [place],  [place] [address] PCP: [name], MD  Chief Complaint  Patient presents with  . Right Foot - Routine Post Op    02/03/19 right foot 1st ray amputation       HPI: Patient is an 85 year old gentleman status post right foot first ray amputation.  Patient had slow healing with wound edges ischemic changes and was started on Trental and a nitroglycerin patch.  Patient states that he is currently nonweightbearing using a medical compression sock he feels like he is improving.  Assessment & Plan: Visit Diagnoses:  1. Delayed surgical wound healing of foot amputation stump ([place])     Plan: Patient is showing excellent interval improvement will continue with current care reevaluate in 2 weeks.  Follow-Up Instructions: Return in about 2 weeks (around 03/13/2019).   Ortho Exam  Patient is alert, oriented, no adenopathy, well-dressed, normal affect, normal respiratory effort. Examination the black eschar is dried this was debrided there was good healthy viable epithelization beneath the eschar.  The wound edges are well approximated there is no odor no drainage no cellulitis no signs of infection.  Imaging: No results found. No images are attached to the encounter.  Labs: Lab Results  Component Value Date   HGBA1C 7.0 (H) 01/31/2019   HGBA1C 6.4 (H) 12/13/2017   HGBA1C 6.4 (H) 12/09/2016   REPTSTATUS 02/04/2019 FINAL 01/30/2019   CULT  01/30/2019    NO GROWTH 5 DAYS Performed at [facility], [place] [address] [place], [place] [address]      Lab Results  Component Value Date   ALBUMIN 2.3 (L) 02/06/2019   ALBUMIN 2.2 (L) 02/05/2019   ALBUMIN 2.2 (L) 02/04/2019    Lab Results  Component Value Date   MG 2.1 02/06/2019   MG 2.3 02/05/2019   MG 2.3  02/04/2019   No results found for: VD25OH  No results found for: PREALBUMIN CBC EXTENDED Latest Ref Rng & Units 02/10/2019 02/09/2019 02/08/2019  WBC 4.0 - 10.5 K/uL 6.0 6.6 7.4  RBC 4.22 - 5.81 MIL/uL 2.95(L) 2.92(L) 2.91(L)  HGB 13.0 - 17.0 g/dL 8.7(L) 8.7(L) 8.5(L)  HCT 39.0 - 52.0 % 27.6(L) 27.1(L) 26.9(L)  PLT 150 - 400 K/uL 336 325 325  NEUTROABS 1.7 - 7.7 K/uL - - -  LYMPHSABS 0.7 - 4.0 K/uL - - -     Body mass index is 29.7 kg/m.  Orders:  No orders of the defined types were placed in this encounter.  No orders of the defined types were placed in this encounter.    Procedures: No procedures performed  Clinical Data: No additional findings.  ROS:  All other systems negative, except as noted in the HPI. Review of Systems  Objective: Vital Signs: Ht 6' (1.829 m)   Wt 219 lb (99.3 kg)   BMI 29.70 kg/m   Specialty Comments:  No specialty comments available.  PMFS History: Patient Active Problem List   Diagnosis Date Noted  . Osteomyelitis of great toe of right foot ([place])   . Severe protein-calorie malnutrition ([place])   . Diabetic polyneuropathy associated with type 2 diabetes mellitus ([place])   . Cutaneous  abscess of right foot   . Cellulitis of right foot 01/30/2019  . CKD (chronic kidney disease), stage III 01/30/2019  . Elevated LFTs 01/30/2019  . Colon cancer ([place]) 01/30/2019  . Anemia of chronic disease 01/30/2019  . Acute on chronic combined systolic and diastolic CHF (congestive heart failure) ([place]) 03/09/2018  . AKI (acute kidney injury) ([place]) 03/09/2018  . Colonic mass 12/10/2017  . S/P TAVR (transcatheter aortic valve replacement) 03/03/2017  . Severe aortic stenosis 12/15/2016  . Essential hypertension 05/09/2013  . Hyperlipidemia 05/09/2013  . PVC's (premature ventricular contractions) 05/09/2013  . Chronic combined systolic and diastolic heart failure ([place]) 05/09/2013   Past Medical History:  Diagnosis Date  . CHF (congestive heart  failure) ([place])   . Dyslipidemia 10/27/2015  . Dyspnea    w/ exertion   . Elevated PSA 10/27/2015  . Erectile dysfunction 10/27/2015  . Heart murmur   . Hypertension 10/27/2015  . Hypogonadism male 10/27/2015  . Obesity 10/27/2015  . Pneumonia 10/27/2015   pt states was 1982  . Rotator cuff tear 10/27/2015    Family History  Problem Relation Age of Onset  . Diabetes Mother   . Heart disease Mother   . Pulmonary embolism Father     Past Surgical History:  Procedure Laterality Date  . ABDOMINAL AORTOGRAM N/A 02/01/2019   Procedure: ABDOMINAL AORTOGRAM;  Surgeon: Marty Heck, MD;  Location: [facility];  Service: Cardiovascular;  Laterality: N/A;  . AMPUTATION Right 02/03/2019   Procedure: RIGHT FOOT 1ST RAY AMPUTATION;  Surgeon: [name], MD;  Location: [facility];  Service: Orthopedics;  Laterality: Right;  . CARDIAC CATHETERIZATION N/A 11/14/2015   Procedure: Left Heart Cath and Coronary Angiography;  Surgeon: [name], MD;  Location: [facility];  Service: Cardiovascular;  Laterality: N/A;  . LAPAROSCOPIC PARTIAL COLECTOMY  12/15/2017   LAPAROSCOPIC PARTIAL COLECTOMY (N/A Abdomen)  . LAPAROSCOPIC PARTIAL COLECTOMY N/A 12/15/2017   Procedure: LAPAROSCOPIC PARTIAL COLECTOMY;  Surgeon: Erroll Luna, MD;  Location: [facility];  Service: General;  Laterality: N/A;  . LOWER EXTREMITY ANGIOGRAPHY Right 02/01/2019   Procedure: LOWER EXTREMITY ANGIOGRAPHY;  Surgeon: Marty Heck, MD;  Location: [facility];  Service: Cardiovascular;  Laterality: Right;  . PERIPHERAL VASCULAR INTERVENTION Right 02/01/2019   Procedure: PERIPHERAL VASCULAR INTERVENTION;  Surgeon: Marty Heck, MD;  Location: [facility];  Service: Cardiovascular;  Laterality: Right;  SFA  . TEE WITHOUT CARDIOVERSION N/A 12/15/2016   Procedure: TRANSESOPHAGEAL ECHOCARDIOGRAM (TEE);  Surgeon: Sherren Mocha, MD;  Location: [facility];  Service: Open Heart Surgery;  Laterality: N/A;   . TRANSCATHETER AORTIC VALVE REPLACEMENT, TRANSFEMORAL N/A 12/15/2016   Procedure: TRANSCATHETER AORTIC VALVE REPLACEMENT, TRANSFEMORAL;  Surgeon: Sherren Mocha, MD;  Location: [facility];  Service: Open Heart Surgery;  Laterality: N/A;   Social History   Occupational History  . Not on file  Tobacco Use  . Smoking status: Former Smoker    Types: Cigarettes  . Smokeless tobacco: Never Used  Substance and Sexual Activity  . Alcohol use: Yes    Comment: rarely  . Drug use: No  . Sexual activity: Not on file

## 2019-03-06 ENCOUNTER — Ambulatory Visit (INDEPENDENT_AMBULATORY_CARE_PROVIDER_SITE_OTHER): Payer: Medicare Other | Admitting: Orthopedic Surgery

## 2019-03-06 ENCOUNTER — Encounter: Payer: Self-pay | Admitting: Orthopedic Surgery

## 2019-03-06 ENCOUNTER — Other Ambulatory Visit: Payer: Self-pay

## 2019-03-06 VITALS — Ht 72.0 in | Wt 219.0 lb

## 2019-03-06 DIAGNOSIS — T8189XA Other complications of procedures, not elsewhere classified, initial encounter: Secondary | ICD-10-CM

## 2019-03-06 DIAGNOSIS — T8789 Other complications of amputation stump: Secondary | ICD-10-CM

## 2019-03-06 NOTE — Progress Notes (Signed)
Office Visit Note   Patient: Robert Horn           Date of Birth: 09/26/1933           MRN: [id number] Visit Date: 03/06/2019              Requested by: [name], MD [address] [place] [address] [place],  [place] [address] PCP: [name], MD  Chief Complaint  Patient presents with  . Right Foot - Routine Post Op    02/03/19 right foot 1st ray amputation       HPI: Patient is an 85 year old gentleman who presents in follow-up 4 weeks status post first ray amputation right foot.  Patient had delayed wound healing with wound dehiscence he was started on Trental nitroglycerin patch and compression stocking patient states he has been doing much better he denies any pain denies any drainage denies any redness.  Assessment & Plan: Visit Diagnoses:  1. Delayed surgical wound healing of foot amputation stump ([place])     Plan: The incision is well-healed patient did not want to have the sutures harvested today we will have them removed in 1 week.  Patient was given a prescription for biotech for extra-depth shoes custom orthotics spacer for the right shoe and a carbon plate for the right shoe.  He has completed his physical therapy.  Follow-Up Instructions: Return in about 1 week (around 03/13/2019).   Ortho Exam  Patient is alert, oriented, no adenopathy, well-dressed, normal affect, normal respiratory effort. Examination patient is showing excellent improvement after starting the Trental nitroglycerin and compression stocking.  The scab was removed and patient has excellent epithelization across the wound.  There is no redness no cellulitis no drainage no signs of infection.  Patient has dorsiflexion to neutral.  Patient states he like to have the sutures removed next visit.  Imaging: No results found. No images are attached to the encounter.  Labs: Lab Results  Component Value Date   HGBA1C 7.0 (H) 01/31/2019   HGBA1C 6.4 (H) 12/13/2017   HGBA1C 6.4 (H) 12/09/2016   REPTSTATUS 02/04/2019 FINAL 01/30/2019   CULT  01/30/2019    NO GROWTH 5 DAYS Performed at [facility], [place] [address], [place], [place] [address]      Lab Results  Component Value Date   ALBUMIN 2.3 (L) 02/06/2019   ALBUMIN 2.2 (L) 02/05/2019   ALBUMIN 2.2 (L) 02/04/2019    Lab Results  Component Value Date   MG 2.1 02/06/2019   MG 2.3 02/05/2019   MG 2.3 02/04/2019   No results found for: VD25OH  No results found for: PREALBUMIN CBC EXTENDED Latest Ref Rng & Units 02/10/2019 02/09/2019 02/08/2019  WBC 4.0 - 10.5 K/uL 6.0 6.6 7.4  RBC 4.22 - 5.81 MIL/uL 2.95(L) 2.92(L) 2.91(L)  HGB 13.0 - 17.0 g/dL 8.7(L) 8.7(L) 8.5(L)  HCT 39.0 - 52.0 % 27.6(L) 27.1(L) 26.9(L)  PLT 150 - 400 K/uL 336 325 325  NEUTROABS 1.7 - 7.7 K/uL - - -  LYMPHSABS 0.7 - 4.0 K/uL - - -     Body mass index is 29.7 kg/m.  Orders:  No orders of the defined types were placed in this encounter.  No orders of the defined types were placed in this encounter.    Procedures: No procedures performed  Clinical Data: No additional findings.  ROS:  All other systems negative, except as noted in the HPI. Review of Systems  Objective: Vital Signs: Ht 6' (1.829 m)  Wt 219 lb (99.3 kg)   BMI 29.70 kg/m   Specialty Comments:  No specialty comments available.  PMFS History: Patient Active Problem List   Diagnosis Date Noted  . Osteomyelitis of great toe of right foot ([place])   . Severe protein-calorie malnutrition ([place])   . Diabetic polyneuropathy associated with type 2 diabetes mellitus ([place])   . Cutaneous abscess of right foot   . Cellulitis of right foot 01/30/2019  . CKD (chronic kidney disease), stage III 01/30/2019  . Elevated LFTs 01/30/2019  . Colon cancer ([place]) 01/30/2019  . Anemia of chronic disease 01/30/2019  . Acute on chronic combined systolic and diastolic CHF (congestive heart failure) ([place]) 03/09/2018  . AKI (acute kidney injury) ([place]) 03/09/2018  . Colonic mass  12/10/2017  . S/P TAVR (transcatheter aortic valve replacement) 03/03/2017  . Severe aortic stenosis 12/15/2016  . Essential hypertension 05/09/2013  . Hyperlipidemia 05/09/2013  . PVC's (premature ventricular contractions) 05/09/2013  . Chronic combined systolic and diastolic heart failure ([place]) 05/09/2013   Past Medical History:  Diagnosis Date  . CHF (congestive heart failure) ([place])   . Dyslipidemia 10/27/2015  . Dyspnea    w/ exertion   . Elevated PSA 10/27/2015  . Erectile dysfunction 10/27/2015  . Heart murmur   . Hypertension 10/27/2015  . Hypogonadism male 10/27/2015  . Obesity 10/27/2015  . Pneumonia 10/27/2015   pt states was 1982  . Rotator cuff tear 10/27/2015    Family History  Problem Relation Age of Onset  . Diabetes Mother   . Heart disease Mother   . Pulmonary embolism Father     Past Surgical History:  Procedure Laterality Date  . ABDOMINAL AORTOGRAM N/A 02/01/2019   Procedure: ABDOMINAL AORTOGRAM;  Surgeon: Marty Heck, MD;  Location: [facility];  Service: Cardiovascular;  Laterality: N/A;  . AMPUTATION Right 02/03/2019   Procedure: RIGHT FOOT 1ST RAY AMPUTATION;  Surgeon: [name], MD;  Location: [facility];  Service: Orthopedics;  Laterality: Right;  . CARDIAC CATHETERIZATION N/A 11/14/2015   Procedure: Left Heart Cath and Coronary Angiography;  Surgeon: [name], MD;  Location: [facility];  Service: Cardiovascular;  Laterality: N/A;  . LAPAROSCOPIC PARTIAL COLECTOMY  12/15/2017   LAPAROSCOPIC PARTIAL COLECTOMY (N/A Abdomen)  . LAPAROSCOPIC PARTIAL COLECTOMY N/A 12/15/2017   Procedure: LAPAROSCOPIC PARTIAL COLECTOMY;  Surgeon: Erroll Luna, MD;  Location: [facility];  Service: General;  Laterality: N/A;  . LOWER EXTREMITY ANGIOGRAPHY Right 02/01/2019   Procedure: LOWER EXTREMITY ANGIOGRAPHY;  Surgeon: Marty Heck, MD;  Location: [facility];  Service: Cardiovascular;  Laterality: Right;  . PERIPHERAL VASCULAR  INTERVENTION Right 02/01/2019   Procedure: PERIPHERAL VASCULAR INTERVENTION;  Surgeon: Marty Heck, MD;  Location: [facility];  Service: Cardiovascular;  Laterality: Right;  SFA  . TEE WITHOUT CARDIOVERSION N/A 12/15/2016   Procedure: TRANSESOPHAGEAL ECHOCARDIOGRAM (TEE);  Surgeon: Sherren Mocha, MD;  Location: [facility];  Service: Open Heart Surgery;  Laterality: N/A;  . TRANSCATHETER AORTIC VALVE REPLACEMENT, TRANSFEMORAL N/A 12/15/2016   Procedure: TRANSCATHETER AORTIC VALVE REPLACEMENT, TRANSFEMORAL;  Surgeon: Sherren Mocha, MD;  Location: [facility];  Service: Open Heart Surgery;  Laterality: N/A;   Social History   Occupational History  . Not on file  Tobacco Use  . Smoking status: Former Smoker    Types: Cigarettes  . Smokeless tobacco: Never Used  Substance and Sexual Activity  . Alcohol use: Yes    Comment: rarely  . Drug use: No  .  Sexual activity: Not on file

## 2019-03-09 ENCOUNTER — Other Ambulatory Visit: Payer: Self-pay | Admitting: Interventional Cardiology

## 2019-03-09 ENCOUNTER — Other Ambulatory Visit: Payer: Self-pay | Admitting: Cardiology

## 2019-03-13 ENCOUNTER — Encounter: Payer: Self-pay | Admitting: Physician Assistant

## 2019-03-13 ENCOUNTER — Other Ambulatory Visit: Payer: Self-pay

## 2019-03-13 ENCOUNTER — Ambulatory Visit (INDEPENDENT_AMBULATORY_CARE_PROVIDER_SITE_OTHER): Payer: Medicare Other | Admitting: Orthopedic Surgery

## 2019-03-13 VITALS — Ht 72.0 in | Wt 219.0 lb

## 2019-03-13 DIAGNOSIS — T8789 Other complications of amputation stump: Secondary | ICD-10-CM

## 2019-03-13 DIAGNOSIS — T8189XA Other complications of procedures, not elsewhere classified, initial encounter: Secondary | ICD-10-CM

## 2019-03-13 NOTE — Progress Notes (Signed)
Office Visit Note   Patient: Robert Horn           Date of Birth: Dec 13, 1933           MRN: [id number] Visit Date: 03/13/2019              Requested by: [name], MD [address] [place] [address] [place],  [place] [address] PCP: [name], MD  Chief Complaint  Patient presents with  . Right Foot - Routine Post Op    02/03/19 right foot 1st ray amputation       HPI: Patient is an 85 year old gentleman presents in follow-up for right foot first ray amputation.  Patient feels like he is making excellent progress.  He is using a nitroglycerin patch to help with microcirculation.  Assessment & Plan: Visit Diagnoses:  1. Delayed surgical wound healing of foot amputation stump ([place])     Plan: We will plan to harvest the sutures today increase his activities as tolerated at reevaluation in 4 weeks evaluate for return to work at the end of January.  Follow-Up Instructions: Return in about 4 weeks (around 04/10/2019).   Ortho Exam  Patient is alert, oriented, no adenopathy, well-dressed, normal affect, normal respiratory effort. Examination the incision is well-healed.  There is no redness no cellulitis no wound dehiscence there is excellent interval improvement.  Sutures are harvested today.  Imaging: No results found. No images are attached to the encounter.  Labs: Lab Results  Component Value Date   HGBA1C 7.0 (H) 01/31/2019   HGBA1C 6.4 (H) 12/13/2017   HGBA1C 6.4 (H) 12/09/2016   REPTSTATUS 02/04/2019 FINAL 01/30/2019   CULT  01/30/2019    NO GROWTH 5 DAYS Performed at [facility], [place] [address], [place], [place] [address]      Lab Results  Component Value Date   ALBUMIN 2.3 (L) 02/06/2019   ALBUMIN 2.2 (L) 02/05/2019   ALBUMIN 2.2 (L) 02/04/2019    Lab Results  Component Value Date   MG 2.1 02/06/2019   MG 2.3 02/05/2019   MG 2.3 02/04/2019   No results found for: VD25OH  No results found for: PREALBUMIN CBC EXTENDED Latest Ref Rng &  Units 02/10/2019 02/09/2019 02/08/2019  WBC 4.0 - 10.5 K/uL 6.0 6.6 7.4  RBC 4.22 - 5.81 MIL/uL 2.95(L) 2.92(L) 2.91(L)  HGB 13.0 - 17.0 g/dL 8.7(L) 8.7(L) 8.5(L)  HCT 39.0 - 52.0 % 27.6(L) 27.1(L) 26.9(L)  PLT 150 - 400 K/uL 336 325 325  NEUTROABS 1.7 - 7.7 K/uL - - -  LYMPHSABS 0.7 - 4.0 K/uL - - -     Body mass index is 29.7 kg/m.  Orders:  No orders of the defined types were placed in this encounter.  No orders of the defined types were placed in this encounter.    Procedures: No procedures performed  Clinical Data: No additional findings.  ROS:  All other systems negative, except as noted in the HPI. Review of Systems  Objective: Vital Signs: Ht 6' (1.829 m)   Wt 219 lb (99.3 kg)   BMI 29.70 kg/m   Specialty Comments:  No specialty comments available.  PMFS History: Patient Active Problem List   Diagnosis Date Noted  . Osteomyelitis of great toe of right foot ([place])   . Severe protein-calorie malnutrition ([place])   . Diabetic polyneuropathy associated with type 2 diabetes mellitus ([place])   . Cutaneous abscess of right foot   . Cellulitis of right foot 01/30/2019  . CKD (  chronic kidney disease), stage III 01/30/2019  . Elevated LFTs 01/30/2019  . Colon cancer ([place]) 01/30/2019  . Anemia of chronic disease 01/30/2019  . Acute on chronic combined systolic and diastolic CHF (congestive heart failure) ([place]) 03/09/2018  . AKI (acute kidney injury) ([place]) 03/09/2018  . Colonic mass 12/10/2017  . S/P TAVR (transcatheter aortic valve replacement) 03/03/2017  . Severe aortic stenosis 12/15/2016  . Essential hypertension 05/09/2013  . Hyperlipidemia 05/09/2013  . PVC's (premature ventricular contractions) 05/09/2013  . Chronic combined systolic and diastolic heart failure ([place]) 05/09/2013   Past Medical History:  Diagnosis Date  . CHF (congestive heart failure) ([place])   . Dyslipidemia 10/27/2015  . Dyspnea    w/ exertion   . Elevated PSA 10/27/2015  . Erectile  dysfunction 10/27/2015  . Heart murmur   . Hypertension 10/27/2015  . Hypogonadism male 10/27/2015  . Obesity 10/27/2015  . Pneumonia 10/27/2015   pt states was 1982  . Rotator cuff tear 10/27/2015    Family History  Problem Relation Age of Onset  . Diabetes Mother   . Heart disease Mother   . Pulmonary embolism Father     Past Surgical History:  Procedure Laterality Date  . ABDOMINAL AORTOGRAM N/A 02/01/2019   Procedure: ABDOMINAL AORTOGRAM;  Surgeon: Marty Heck, MD;  Location: [facility];  Service: Cardiovascular;  Laterality: N/A;  . AMPUTATION Right 02/03/2019   Procedure: RIGHT FOOT 1ST RAY AMPUTATION;  Surgeon: [name], MD;  Location: [facility];  Service: Orthopedics;  Laterality: Right;  . CARDIAC CATHETERIZATION N/A 11/14/2015   Procedure: Left Heart Cath and Coronary Angiography;  Surgeon: [name], MD;  Location: [facility];  Service: Cardiovascular;  Laterality: N/A;  . LAPAROSCOPIC PARTIAL COLECTOMY  12/15/2017   LAPAROSCOPIC PARTIAL COLECTOMY (N/A Abdomen)  . LAPAROSCOPIC PARTIAL COLECTOMY N/A 12/15/2017   Procedure: LAPAROSCOPIC PARTIAL COLECTOMY;  Surgeon: Erroll Luna, MD;  Location: [facility];  Service: General;  Laterality: N/A;  . LOWER EXTREMITY ANGIOGRAPHY Right 02/01/2019   Procedure: LOWER EXTREMITY ANGIOGRAPHY;  Surgeon: Marty Heck, MD;  Location: [facility];  Service: Cardiovascular;  Laterality: Right;  . PERIPHERAL VASCULAR INTERVENTION Right 02/01/2019   Procedure: PERIPHERAL VASCULAR INTERVENTION;  Surgeon: Marty Heck, MD;  Location: [facility];  Service: Cardiovascular;  Laterality: Right;  SFA  . TEE WITHOUT CARDIOVERSION N/A 12/15/2016   Procedure: TRANSESOPHAGEAL ECHOCARDIOGRAM (TEE);  Surgeon: Sherren Mocha, MD;  Location: [facility];  Service: Open Heart Surgery;  Laterality: N/A;  . TRANSCATHETER AORTIC VALVE REPLACEMENT, TRANSFEMORAL N/A 12/15/2016   Procedure: TRANSCATHETER AORTIC VALVE  REPLACEMENT, TRANSFEMORAL;  Surgeon: Sherren Mocha, MD;  Location: [facility];  Service: Open Heart Surgery;  Laterality: N/A;   Social History   Occupational History  . Not on file  Tobacco Use  . Smoking status: Former Smoker    Types: Cigarettes  . Smokeless tobacco: Never Used  Substance and Sexual Activity  . Alcohol use: Yes    Comment: rarely  . Drug use: No  . Sexual activity: Not on file

## 2019-03-16 ENCOUNTER — Other Ambulatory Visit: Payer: Self-pay

## 2019-03-16 ENCOUNTER — Inpatient Hospital Stay: Payer: Medicare Other | Attending: Oncology | Admitting: Oncology

## 2019-03-16 ENCOUNTER — Inpatient Hospital Stay: Payer: Medicare Other

## 2019-03-16 ENCOUNTER — Telehealth: Payer: Self-pay | Admitting: *Deleted

## 2019-03-16 VITALS — BP 139/116 | HR 88 | Temp 98.5°F | Resp 17 | Ht 72.0 in | Wt 223.4 lb

## 2019-03-16 DIAGNOSIS — N400 Enlarged prostate without lower urinary tract symptoms: Secondary | ICD-10-CM | POA: Insufficient documentation

## 2019-03-16 DIAGNOSIS — C184 Malignant neoplasm of transverse colon: Secondary | ICD-10-CM | POA: Diagnosis not present

## 2019-03-16 DIAGNOSIS — Z85038 Personal history of other malignant neoplasm of large intestine: Secondary | ICD-10-CM | POA: Insufficient documentation

## 2019-03-16 LAB — CEA (IN HOUSE-CHCC): CEA (CHCC-In House): 1.96 ng/mL (ref 0.00–5.00)

## 2019-03-16 NOTE — Progress Notes (Signed)
Called Dr. Kathline Magic office at [facility] to obtain recent colonoscopy report. Left a message with medical records for return call.

## 2019-03-16 NOTE — Progress Notes (Signed)
  [facility] OFFICE PROGRESS NOTE   Diagnosis: Colon cancer  INTERVAL HISTORY:   [name] returns as scheduled.  Good appetite.  No difficulty with bowel function.  No bleeding.  He underwent a partial amputation of the right foot on 02/03/2019 secondary to gangrene with osteomyelitis.  He continues follow-up with Dr. Sharol Given.  He reports undergoing a colonoscopy several months ago (we do not have the report).  Objective:  Vital signs in last 24 hours:  Blood pressure (!) 139/116, pulse 88, temperature 98.5 F (36.9 C), temperature source Temporal, resp. rate 17, height 6' (1.829 m), weight 223 lb 6.4 oz (101.3 kg), SpO2 100 %.    Limited physical examination secondary to distancing with the Covid pandemic Lymphatics: No cervical, supraclavicular, axillary, or inguinal nodes GI: No hepatosplenomegaly, nontender, no mass Vascular: No leg edema, support stocking in place at the right lower leg, Musculoskeletal: Right foot boot   Lab Results:   Lab Results  Component Value Date   CEA1 2.48 09/12/2018     Medications: I have reviewed the patient's current medications.   Assessment/Plan: 1. Colon cancer, transverse, stage II (T3N0), status post a right and transverse colectomy 12/15/2017 ? 0/25 lymph nodes positive, no lymphovascular or perineural invasion ? MSI stable, no loss of mismatch repair protein expression ? CTs 12/10/2017- transverse colon wall thickening, 8 mm transverse mesocolon lymph node, no evidence of metastatic disease, stable small bilateral pulmonary nodules ? Colonoscopy 12/09/2017-transverse colon mass, sessile polyps in the descending colon-not removed 2. Polyps on the 12/15/2017 resection specimen- tubular adenomas 3. Enlarged prostate with elevated PSA 4. Status post TAVR 12/15/2016 5. Hypertension 6. Pneumonia 1982 7. History of CHF    Disposition: Mr. Kimrey is in clinical remission from colon cancer.  We will follow up on the CEA  from today.  He will return for an office visit and CEA in 6 months.  We will obtain the recent colonoscopy report  from Dr. Michail Sermon.  Betsy Coder, MD  03/16/2019  11:46 AM

## 2019-03-16 NOTE — Telephone Encounter (Signed)
Telephone call to patient. Message left for a return call. Patient had elevated blood pressure in the office today. Dr. Benay Spice would like him to have this rechecked and monitored.

## 2019-03-17 ENCOUNTER — Telehealth: Payer: Self-pay | Admitting: Oncology

## 2019-03-17 ENCOUNTER — Telehealth: Payer: Self-pay

## 2019-03-17 NOTE — Telephone Encounter (Signed)
TC to pt per Ned Card NP to let him know  CEA is normal. Pt verbalized understanding. No further problems or concerns at this time.

## 2019-03-17 NOTE — Telephone Encounter (Signed)
Scheduled per los. Called and left msg. Mailed printout  °

## 2019-03-22 ENCOUNTER — Other Ambulatory Visit: Payer: Self-pay

## 2019-03-22 DIAGNOSIS — I739 Peripheral vascular disease, unspecified: Secondary | ICD-10-CM

## 2019-03-27 NOTE — Progress Notes (Signed)
HISTORY AND PHYSICAL     CC:  follow up. Requesting Provider:  [name], MD  HPI: This is a 85 y.o. male who is here today for follow up.  He underwent aortogram on 02/01/2019 with right SFA angioplasty with stent placement by Dr. Carlis Abbott, which was done for non healing wound on the right foot. He is s/p right great toe/1st metatarsal amputation by Dr. Sharol Given on 11/06/202.  He is applying nitro patch to dorsum of right foot and cleaning daily with Dial soap.  He wears a post-op shoe.  He complains of soreness of the dorsum of the right foot.  The pt returns today for follow-up of aortogram.  Office visit conducted with patient's wife on speaker phone.  The pt is on a statin for cholesterol management.    The pt is on an aspirin.    Other AC:  Plavix The pt is on CCB, BB, ARB for hypertension.  The pt does not have diabetes. Tobacco hx:  remote   Past Medical History:  Diagnosis Date  . CHF (congestive heart failure) ([place])   . Dyslipidemia 10/27/2015  . Dyspnea    w/ exertion   . Elevated PSA 10/27/2015  . Erectile dysfunction 10/27/2015  . Heart murmur   . Hypertension 10/27/2015  . Hypogonadism male 10/27/2015  . Obesity 10/27/2015  . Pneumonia 10/27/2015   pt states was 1982  . Rotator cuff tear 10/27/2015    Past Surgical History:  Procedure Laterality Date  . ABDOMINAL AORTOGRAM N/A 02/01/2019   Procedure: ABDOMINAL AORTOGRAM;  Surgeon: Marty Heck, MD;  Location: [facility];  Service: Cardiovascular;  Laterality: N/A;  . AMPUTATION Right 02/03/2019   Procedure: RIGHT FOOT 1ST RAY AMPUTATION;  Surgeon: [name], MD;  Location: [facility];  Service: Orthopedics;  Laterality: Right;  . CARDIAC CATHETERIZATION N/A 11/14/2015   Procedure: Left Heart Cath and Coronary Angiography;  Surgeon: [name], MD;  Location: [facility];  Service: Cardiovascular;  Laterality: N/A;  . LAPAROSCOPIC PARTIAL COLECTOMY  12/15/2017   LAPAROSCOPIC PARTIAL  COLECTOMY (N/A Abdomen)  . LAPAROSCOPIC PARTIAL COLECTOMY N/A 12/15/2017   Procedure: LAPAROSCOPIC PARTIAL COLECTOMY;  Surgeon: Erroll Luna, MD;  Location: [facility];  Service: General;  Laterality: N/A;  . LOWER EXTREMITY ANGIOGRAPHY Right 02/01/2019   Procedure: LOWER EXTREMITY ANGIOGRAPHY;  Surgeon: Marty Heck, MD;  Location: [facility];  Service: Cardiovascular;  Laterality: Right;  . PERIPHERAL VASCULAR INTERVENTION Right 02/01/2019   Procedure: PERIPHERAL VASCULAR INTERVENTION;  Surgeon: Marty Heck, MD;  Location: [facility];  Service: Cardiovascular;  Laterality: Right;  SFA  . TEE WITHOUT CARDIOVERSION N/A 12/15/2016   Procedure: TRANSESOPHAGEAL ECHOCARDIOGRAM (TEE);  Surgeon: Sherren Mocha, MD;  Location: [facility];  Service: Open Heart Surgery;  Laterality: N/A;  . TRANSCATHETER AORTIC VALVE REPLACEMENT, TRANSFEMORAL N/A 12/15/2016   Procedure: TRANSCATHETER AORTIC VALVE REPLACEMENT, TRANSFEMORAL;  Surgeon: Sherren Mocha, MD;  Location: [facility];  Service: Open Heart Surgery;  Laterality: N/A;    No Known Allergies  Current Outpatient Medications  Medication Sig Dispense Refill  . amLODipine (NORVASC) 10 MG tablet TAKE 1 TABLET BY MOUTH EVERY DAY 90 tablet 3  . aspirin (ASPIRIN EC) 81 MG EC tablet Take 81 mg by mouth daily. Swallow whole.    . carvedilol (COREG) 3.125 MG tablet TAKE 1 TABLET (3.125 MG TOTAL) BY MOUTH 2 (TWO) TIMES DAILY. 180 tablet 3  . clopidogrel (PLAVIX) 75 MG tablet Take 1 tablet (  75 mg total) by mouth daily with breakfast. 30 tablet 0  . ferrous sulfate 325 (65 FE) MG tablet Take 325 mg by mouth daily with breakfast.    . furosemide (LASIX) 40 MG tablet TAKE 1.5 TABLETS BY MOUTH DAILY. 135 tablet 1  . gabapentin (NEURONTIN) 300 MG capsule Take 1 capsule (300 mg total) by mouth 3 (three) times daily. 90 capsule 0  . Multiple Vitamin (MULTIVITAMIN WITH MINERALS) TABS tablet Take 1 tablet by mouth daily. 30 tablet 1  . nitroGLYCERIN  (NITRODUR - DOSED IN MG/24 HR) 0.2 mg/hr patch Place 1 patch (0.2 mg total) onto the skin daily. 30 patch 12  . omega-3 acid ethyl esters (LOVAZA) 1 g capsule TAKE 1 CAPSULE BY MOUTH EVERY DAY 90 capsule 3  . pentoxifylline (TRENTAL) 400 MG CR tablet Take 1 tablet (400 mg total) by mouth 3 (three) times daily with meals. 90 tablet 3  . rosuvastatin (CRESTOR) 10 MG tablet Take 10 mg by mouth daily.    . sacubitril-valsartan (ENTRESTO) 49-51 MG Take 1 tablet by mouth 2 (two) times daily. 180 tablet 3  . VITAMIN B COMPLEX-C PO Take 1 tablet by mouth daily.     No current facility-administered medications for this visit.    Family History  Problem Relation Age of Onset  . Diabetes Mother   . Heart disease Mother   . Pulmonary embolism Father     Social History   Socioeconomic History  . Marital status: Single    Spouse name: Not on file  . Number of children: Not on file  . Years of education: Not on file  . Highest education level: Not on file  Occupational History  . Not on file  Tobacco Use  . Smoking status: Former Smoker    Types: Cigarettes  . Smokeless tobacco: Never Used  Substance and Sexual Activity  . Alcohol use: Yes    Comment: rarely  . Drug use: No  . Sexual activity: Not on file  Other Topics Concern  . Not on file  Social History Narrative  . Not on file   Social Determinants of Health   Financial Resource Strain:   . Difficulty of Paying Living Expenses: Not on file  Food Insecurity:   . Worried About Charity fundraiser in the Last Year: Not on file  . Ran Out of Food in the Last Year: Not on file  Transportation Needs:   . Lack of Transportation (Medical): Not on file  . Lack of Transportation (Non-Medical): Not on file  Physical Activity:   . Days of Exercise per Week: Not on file  . Minutes of Exercise per Session: Not on file  Stress:   . Feeling of Stress : Not on file  Social Connections:   . Frequency of Communication with Friends and  Family: Not on file  . Frequency of Social Gatherings with Friends and Family: Not on file  . Attends Religious Services: Not on file  . Active Member of Clubs or Organizations: Not on file  . Attends Archivist Meetings: Not on file  . Marital Status: Not on file  Intimate Partner Violence:   . Fear of Current or Ex-Partner: Not on file  . Emotionally Abused: Not on file  . Physically Abused: Not on file  . Sexually Abused: Not on file     REVIEW OF SYSTEMS:  Blood pressure (!) 149/89, pulse (!) 58, temperature 97.9 F (36.6 C), resp. rate 20, height 6' (1.829  m), weight 222 lb 14.4 oz (101.1 kg), SpO2 99 %.  [X]  denotes positive finding, [ ]  denotes negative finding Cardiac  Comments:  Chest pain or chest pressure:    Shortness of breath upon exertion:    Short of breath when lying flat:    Irregular heart rhythm:        Vascular    Pain in calf, thigh, or hip brought on by ambulation:    Pain in feet at night that wakes you up from your sleep:     Blood clot in your veins:    Leg swelling:  x Mild, on left      Pulmonary    Oxygen at home:    Productive cough:     Wheezing:         Neurologic    Sudden weakness in arms or legs:     Sudden numbness in arms or legs:     Sudden onset of difficulty speaking or slurred speech:    Temporary loss of vision in one eye:     Problems with dizziness:         Gastrointestinal    Blood in stool:     Vomited blood:         Genitourinary    Burning when urinating:     Blood in urine:        Psychiatric    Major depression:         Hematologic    Bleeding problems:    Problems with blood clotting too easily:        Skin    Rashes or ulcers:        Constitutional    Fever or chills:      PHYSICAL EXAMINATION:    General:  WDWN in NAD; vital signs documented above Gait: Not observed HENT: WNL, normocephalic Pulmonary: normal non-labored breathing , without Rales, rhonchi,  wheezing Cardiac: regular  HR, with  Murmurs; without carotid bruits Abdomen: soft, NT, no masses Skin: without rashes Vascular Exam/Pulses:  Right Left  DP bisphasic biphasic  PT biphasic absent   Extremities: without ischemic changes, without Gangrene , without cellulitis; without open wounds; right foot with mild dependent rubor and edema. Amp site with adherent crust, no drainage or tenderness Musculoskeletal: no muscle wasting or atrophy  Neurologic: A&O X 3;  No focal weakness or paresthesias are detected Psychiatric:  The pt has Normal affect.   Non-Invasive Vascular Imaging:   ABI's/TBI's on 03/28/2019: Right:  1.16 Left:  0.70   Arterial duplex on 03/28/2019: Right: Patent stent with no evidence of stenosis in the superficial femoral artery  Previous ABI's/TBI's on 01/31/2019: Right:  0.8 Left:  0.82  Venous duplex RLE 01/27/2019: Right: There is no evidence of deep vein thrombosis in the lower extremity. No cystic structure found in the popliteal fossa   ASSESSMENT/PLAN:: 85 y.o. male here for follow up for aortogram on 02/01/2019 with right SFA angioplasty with stent placement by Dr. Carlis Abbott, which was done for non healing wound on the right foot. His amputation sight is healing without breakdown.  Right LE ABI improved after SFA stent.  He will continue to see Dr. Sharol Given weekly and has an appointment on Monday    -We will follow-up in 6 months with repeat ABIs.     Risa Grill, PA-C [facility] [phone]  Clinic MD:   Carlis Abbott

## 2019-03-28 ENCOUNTER — Ambulatory Visit (INDEPENDENT_AMBULATORY_CARE_PROVIDER_SITE_OTHER): Payer: Medicare Other | Admitting: Physician Assistant

## 2019-03-28 ENCOUNTER — Other Ambulatory Visit: Payer: Self-pay

## 2019-03-28 ENCOUNTER — Ambulatory Visit (INDEPENDENT_AMBULATORY_CARE_PROVIDER_SITE_OTHER)
Admission: RE | Admit: 2019-03-28 | Discharge: 2019-03-28 | Disposition: A | Discharge status: Home / Self Care | Payer: Medicare Other | Source: Ambulatory Visit | Attending: Family | Admitting: Family

## 2019-03-28 ENCOUNTER — Ambulatory Visit (HOSPITAL_COMMUNITY)
Admission: RE | Admit: 2019-03-28 | Discharge: 2019-03-28 | Disposition: A | Discharge status: Home / Self Care | Payer: Medicare Other | Source: Ambulatory Visit | Attending: Family | Admitting: Family

## 2019-03-28 VITALS — BP 149/89 | HR 58 | Temp 97.9°F | Resp 20 | Ht 72.0 in | Wt 222.9 lb

## 2019-03-28 DIAGNOSIS — I739 Peripheral vascular disease, unspecified: Secondary | ICD-10-CM | POA: Insufficient documentation

## 2019-03-30 ENCOUNTER — Encounter: Payer: Medicare Other | Attending: Internal Medicine | Admitting: Registered"

## 2019-03-30 ENCOUNTER — Other Ambulatory Visit: Payer: Self-pay

## 2019-03-30 ENCOUNTER — Encounter: Payer: Self-pay | Admitting: Registered"

## 2019-03-30 DIAGNOSIS — E119 Type 2 diabetes mellitus without complications: Secondary | ICD-10-CM

## 2019-03-30 NOTE — Progress Notes (Signed)
Visit was completed via MyChart  Diabetes Self-Management Education  Visit Type: First/Initial  Appt. Start Time: 1030 Appt. End Time: [id number]  03/30/2019  [name], identified by name and date of birth, is a 85 y.o. male with a diagnosis of Diabetes: Type 2.   ASSESSMENT  There were no vitals taken for this visit. There is no height or weight on file to calculate BMI.   Patient and his wife connected via MyChart for video visit.   Patient's wife states she has made some changes to their diet and would like further guidance about what foods they should be eating and which to avoid.  Pt reports he is s/p amputation of toe and some of foot on Jan 30, 2019. Pt reports the amputation was d/t blocked artery in thigh and an stent was place. C/o swelling on left side of R foot and he his being followed by his MD.  Pt was very interested in how all his conditions and medications interacted with each other.  Diabetes Self-Management Education - 03/30/19 1157      Visit Information   Visit Type  First/Initial      Initial Visit   Diabetes Type  Type 2    Are you currently following a meal plan?  No    Are you taking your medications as prescribed?  Not on Medications    Date Diagnosed  Nov 2020      Health Coping   How would you rate your overall health?  Good      Psychosocial Assessment   Patient Belief/Attitude about Diabetes  Other (comment)   not one way or another, it's just medical information     Complications   Last HgB A1C per patient/outside source  6.6 %   per outside referral 02/21/19   How often do you check your blood sugar?  0 times/day (not testing)    Have you had a dilated eye exam in the past 12 months?  No    Have you had a dental exam in the past 12 months?  No    Are you checking your feet?  Yes    How many days per week are you checking your feet?  7      Exercise   Exercise Type  Light (walking / raking leaves)   PT exercises   How many days  per week to you exercise?  7      Patient Education   Previous Diabetes Education  No    Nutrition management   Role of diet in the treatment of diabetes and the relationship between the three main macronutrients and blood glucose level;Food label reading, portion sizes and measuring food.;Carbohydrate counting    Physical activity and exercise   Role of exercise on diabetes management, blood pressure control and cardiac health.    Monitoring  Identified appropriate SMBG and/or A1C goals.      Individualized Goals (developed by patient)   Nutrition  General guidelines for healthy choices and portions discussed      Outcomes   Expected Outcomes  Demonstrated interest in learning. Expect positive outcomes    Future DMSE  PRN    Program Status  Completed       Individualized Plan for Diabetes Self-Management Training:   Learning Objective:  Patient will have a greater understanding of diabetes self-management. Patient education plan is to attend individual and/or group sessions per assessed needs and concerns.   Plan:   Patient Instructions  Plan:  Aim for 2-3 Carb Choices per meal (30-45 grams) +/- 1 either way  Aim for 0-1 Carbs per snack if hungry  Include protein in moderation with your meals and snacks Consider reading food labels for Total Carbohydrate of foods Consider  increasing your activity daily as tolerated If you are curious about how your meal choices affect your blood sugar, you can purchase a glucose meter and occasionally check fasting blood sugar as well as 2 hours after a meal. Consider signing up for the ADA program for people with new diagnosis of diabetes   Expected Outcomes:  Demonstrated interest in learning. Expect positive outcomes  Education material provided: (via mail)  ADA - How to Thrive: A Guide for Your Journey with Diabetes, A1C conversion sheet and Carbohydrate counting sheet  If problems or questions, patient to contact team via:  Phone and  [place]  Future DSME appointment: PRN

## 2019-03-30 NOTE — Patient Instructions (Addendum)
Plan:  Aim for 2-3 Carb Choices per meal (30-45 grams) +/- 1 either way  Aim for 0-1 Carbs per snack if hungry  Include protein with your meals and snacks (Protein needs increased during time of healing from surgery) Consider reading food labels for Total Carbohydrate of foods Consider  increasing your activity daily as tolerated If you are curious about how your meal choices affect your blood sugar, you can purchase a glucose meter and occasionally check fasting blood sugar as well as 2 hours after a meal. Consider signing up for the ADA program for people with new diagnosis of diabetes

## 2019-04-03 ENCOUNTER — Encounter: Payer: Self-pay | Admitting: Physician Assistant

## 2019-04-03 ENCOUNTER — Ambulatory Visit (INDEPENDENT_AMBULATORY_CARE_PROVIDER_SITE_OTHER): Payer: Medicare Other | Admitting: Orthopedic Surgery

## 2019-04-03 ENCOUNTER — Other Ambulatory Visit: Payer: Self-pay

## 2019-04-03 VITALS — Ht 72.0 in | Wt 229.0 lb

## 2019-04-03 DIAGNOSIS — T8189XA Other complications of procedures, not elsewhere classified, initial encounter: Secondary | ICD-10-CM

## 2019-04-03 DIAGNOSIS — T8789 Other complications of amputation stump: Secondary | ICD-10-CM

## 2019-04-03 NOTE — Progress Notes (Signed)
Office Visit Note   Patient: Robert Horn           Date of Birth: 11/12/1933           MRN: [id number] Visit Date: 04/03/2019              Requested by: [name], MD [address] [place],  [place] [address] PCP: [name], MD  Chief Complaint  Patient presents with  . Right Foot - Routine Post Op    02/13/19 right foot 1st ray amputation       HPI: Patient is an 86 year old gentleman who is seen in follow-up status post right foot first ray amputation patient has had delayed healing he is currently using a nitroglycerin patch a compression stocking and is nonweightbearing with a kneeling scooter he states he still has some pain is concerned with the spot on the dorsum of the clawed second toe.  Patient is using Tylenol for pain.  Patient states he is not ready to return to work.  Assessment & Plan: Visit Diagnoses:  1. Delayed surgical wound healing of foot amputation stump ([place])     Plan: Patient was given a note for an additional 90 days out of work starting today.  Recommended that he could be starting weightbearing as tolerated he will follow-up with [facility] for his orthotic fitting.  Discussed that he can stop using the nitroglycerin patch.  Discussed that he could use a smaller compression sock currently in an extra-large and could use a large sock.  Follow-Up Instructions: Return in about 4 weeks (around 05/01/2019).   Ortho Exam  Patient is alert, oriented, no adenopathy, well-dressed, normal affect, normal respiratory effort. Examination the surgical incision is well-healed there is no redness no cellulitis no swelling no signs of infection.  He has dorsiflexion of the ankle to neutral no equinus contracture patient has had a vascular study which shows adequate circulation.  Imaging: No results found. No images are attached to the encounter.  Labs: Lab Results  Component Value Date   HGBA1C 7.0 (H) 01/31/2019   HGBA1C 6.4 (H) 12/13/2017     HGBA1C 6.4 (H) 12/09/2016   REPTSTATUS 02/04/2019 FINAL 01/30/2019   CULT  01/30/2019    NO GROWTH 5 DAYS Performed at [facility], [place] [address] [place], [place] [address]      Lab Results  Component Value Date   ALBUMIN 2.3 (L) 02/06/2019   ALBUMIN 2.2 (L) 02/05/2019   ALBUMIN 2.2 (L) 02/04/2019    Lab Results  Component Value Date   MG 2.1 02/06/2019   MG 2.3 02/05/2019   MG 2.3 02/04/2019   No results found for: VD25OH  No results found for: PREALBUMIN CBC EXTENDED Latest Ref Rng & Units 02/10/2019 02/09/2019 02/08/2019  WBC 4.0 - 10.5 K/uL 6.0 6.6 7.4  RBC 4.22 - 5.81 MIL/uL 2.95(L) 2.92(L) 2.91(L)  HGB 13.0 - 17.0 g/dL 8.7(L) 8.7(L) 8.5(L)  HCT 39.0 - 52.0 % 27.6(L) 27.1(L) 26.9(L)  PLT 150 - 400 K/uL 336 325 325  NEUTROABS 1.7 - 7.7 K/uL - - -  LYMPHSABS 0.7 - 4.0 K/uL - - -     Body mass index is 31.06 kg/m.  Orders:  No orders of the defined types were placed in this encounter.  No orders of the defined types were placed in this encounter.    Procedures: No procedures performed  Clinical Data: No additional findings.  ROS:  All other systems negative, except as noted  in the HPI. Review of Systems  Objective: Vital Signs: Ht 6' (1.829 m)   Wt 229 lb (103.9 kg)   BMI 31.06 kg/m   Specialty Comments:  No specialty comments available.  PMFS History: Patient Active Problem List   Diagnosis Date Noted  . Osteomyelitis of great toe of right foot ([place])   . Severe protein-calorie malnutrition ([place])   . Diabetic polyneuropathy associated with type 2 diabetes mellitus ([place])   . Cutaneous abscess of right foot   . Cellulitis of right foot 01/30/2019  . CKD (chronic kidney disease), stage III 01/30/2019  . Elevated LFTs 01/30/2019  . Colon cancer ([place]) 01/30/2019  . Anemia of chronic disease 01/30/2019  . Acute on chronic combined systolic and diastolic CHF (congestive heart failure) ([place]) 03/09/2018  . AKI (acute kidney injury)  ([place]) 03/09/2018  . Colonic mass 12/10/2017  . S/P TAVR (transcatheter aortic valve replacement) 03/03/2017  . Severe aortic stenosis 12/15/2016  . Essential hypertension 05/09/2013  . Hyperlipidemia 05/09/2013  . PVC's (premature ventricular contractions) 05/09/2013  . Chronic combined systolic and diastolic heart failure ([place]) 05/09/2013   Past Medical History:  Diagnosis Date  . CHF (congestive heart failure) ([place])   . Dyslipidemia 10/27/2015  . Dyspnea    w/ exertion   . Elevated PSA 10/27/2015  . Erectile dysfunction 10/27/2015  . Heart murmur   . Hypertension 10/27/2015  . Hypogonadism male 10/27/2015  . Obesity 10/27/2015  . Pneumonia 10/27/2015   pt states was 1982  . Rotator cuff tear 10/27/2015    Family History  Problem Relation Age of Onset  . Diabetes Mother   . Heart disease Mother   . Pulmonary embolism Father     Past Surgical History:  Procedure Laterality Date  . ABDOMINAL AORTOGRAM N/A 02/01/2019   Procedure: ABDOMINAL AORTOGRAM;  Surgeon: Marty Heck, MD;  Location: [facility];  Service: Cardiovascular;  Laterality: N/A;  . AMPUTATION Right 02/03/2019   Procedure: RIGHT FOOT 1ST RAY AMPUTATION;  Surgeon: [name], MD;  Location: [facility];  Service: Orthopedics;  Laterality: Right;  . CARDIAC CATHETERIZATION N/A 11/14/2015   Procedure: Left Heart Cath and Coronary Angiography;  Surgeon: [name], MD;  Location: [facility];  Service: Cardiovascular;  Laterality: N/A;  . LAPAROSCOPIC PARTIAL COLECTOMY  12/15/2017   LAPAROSCOPIC PARTIAL COLECTOMY (N/A Abdomen)  . LAPAROSCOPIC PARTIAL COLECTOMY N/A 12/15/2017   Procedure: LAPAROSCOPIC PARTIAL COLECTOMY;  Surgeon: Erroll Luna, MD;  Location: [facility];  Service: General;  Laterality: N/A;  . LOWER EXTREMITY ANGIOGRAPHY Right 02/01/2019   Procedure: LOWER EXTREMITY ANGIOGRAPHY;  Surgeon: Marty Heck, MD;  Location: [facility];  Service: Cardiovascular;  Laterality:  Right;  . PERIPHERAL VASCULAR INTERVENTION Right 02/01/2019   Procedure: PERIPHERAL VASCULAR INTERVENTION;  Surgeon: Marty Heck, MD;  Location: [facility];  Service: Cardiovascular;  Laterality: Right;  SFA  . TEE WITHOUT CARDIOVERSION N/A 12/15/2016   Procedure: TRANSESOPHAGEAL ECHOCARDIOGRAM (TEE);  Surgeon: Sherren Mocha, MD;  Location: [facility];  Service: Open Heart Surgery;  Laterality: N/A;  . TRANSCATHETER AORTIC VALVE REPLACEMENT, TRANSFEMORAL N/A 12/15/2016   Procedure: TRANSCATHETER AORTIC VALVE REPLACEMENT, TRANSFEMORAL;  Surgeon: Sherren Mocha, MD;  Location: [facility];  Service: Open Heart Surgery;  Laterality: N/A;   Social History   Occupational History  . Not on file  Tobacco Use  . Smoking status: Former Smoker    Types: Cigarettes  . Smokeless tobacco: Never Used  Substance and Sexual  Activity  . Alcohol use: Yes    Comment: rarely  . Drug use: No  . Sexual activity: Not on file

## 2019-04-05 IMAGING — CR DG CHEST 2V
2 series · 2 of 2 positions shown · non-contrast
Comparison: Chest x-ray of October 26, 2015

CLINICAL DATA: Preoperative examination prior cardiac valve
surgery. History of CHF, aortic stenosis, former smoker.

EXAM:
CHEST  2 VIEW

[w chest pa]
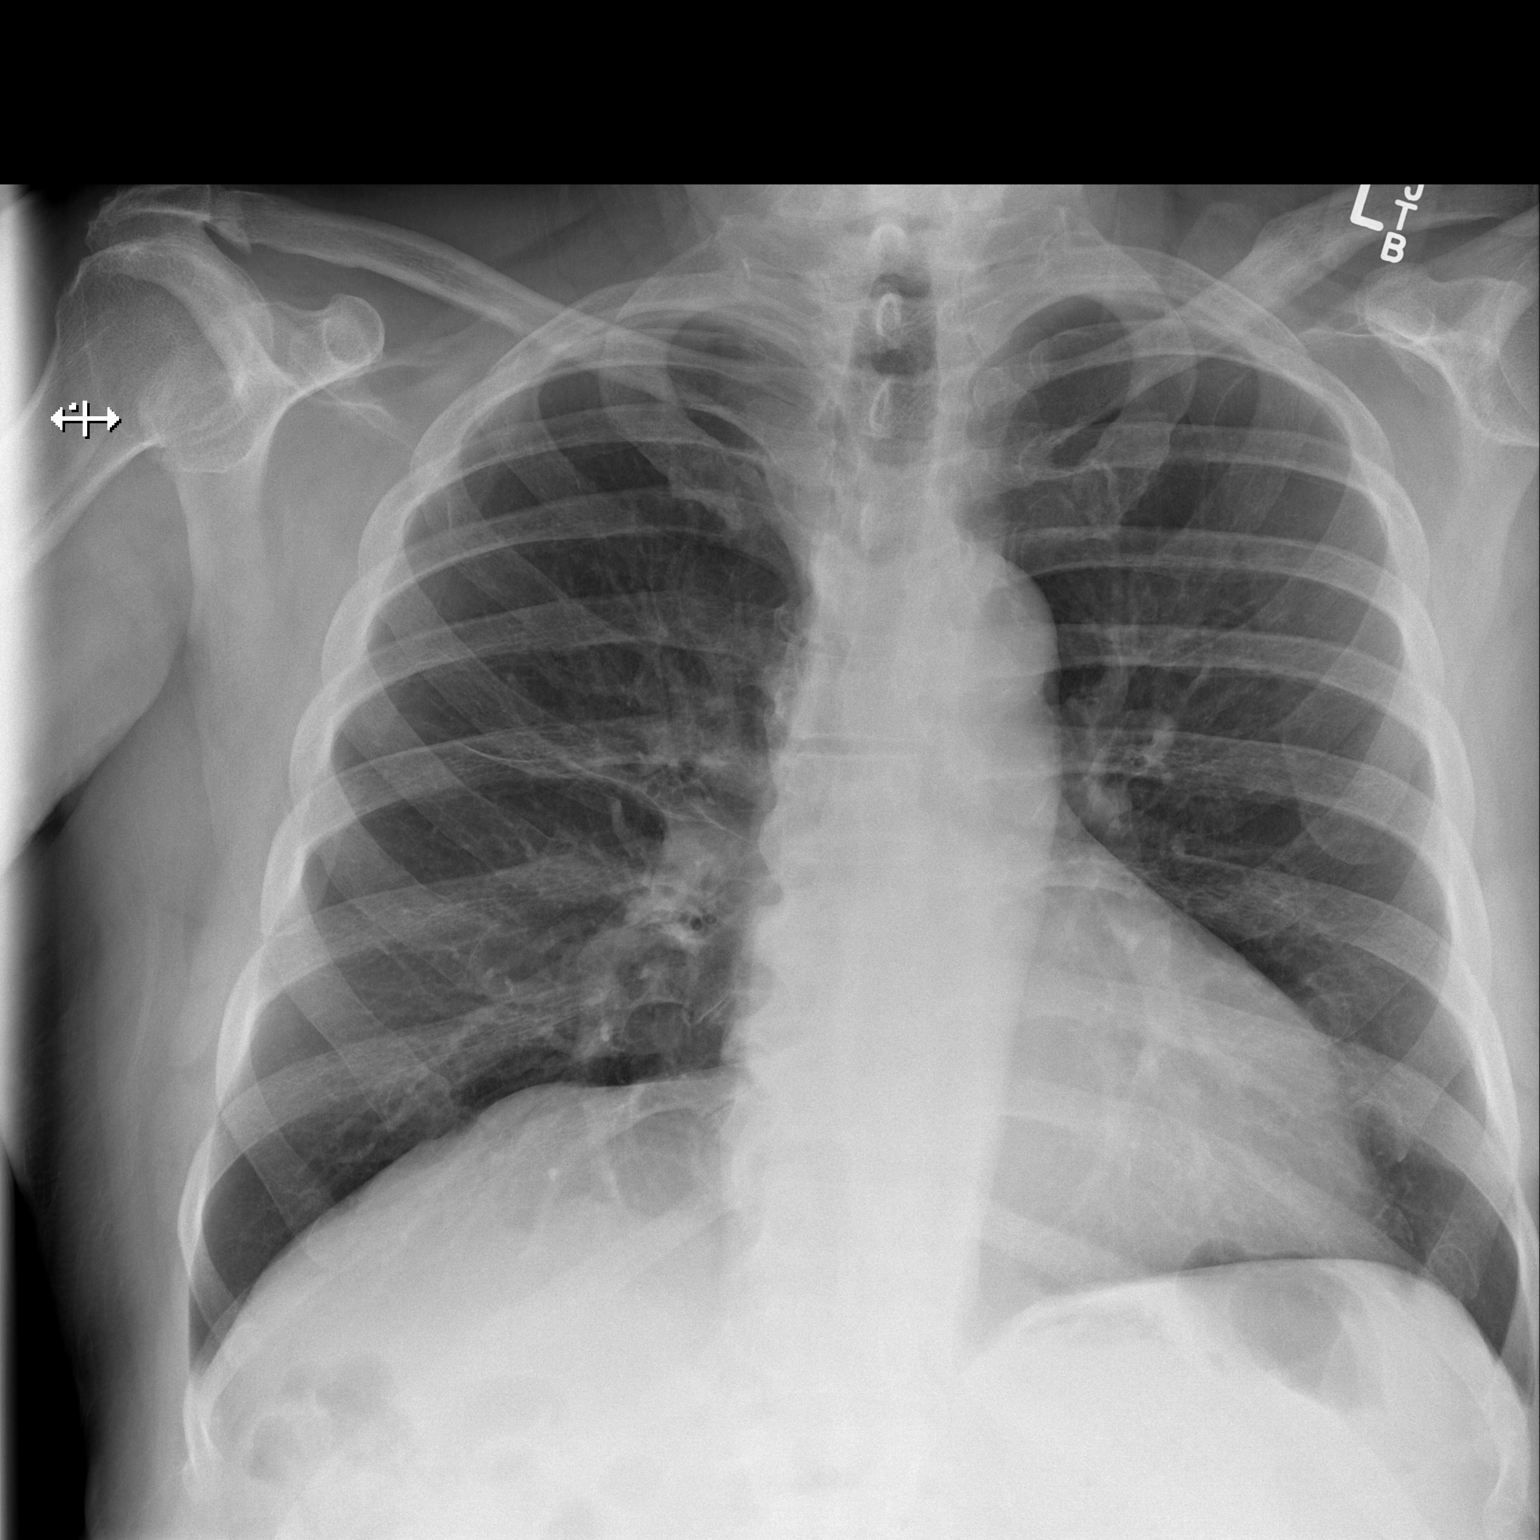

[w chest lat]
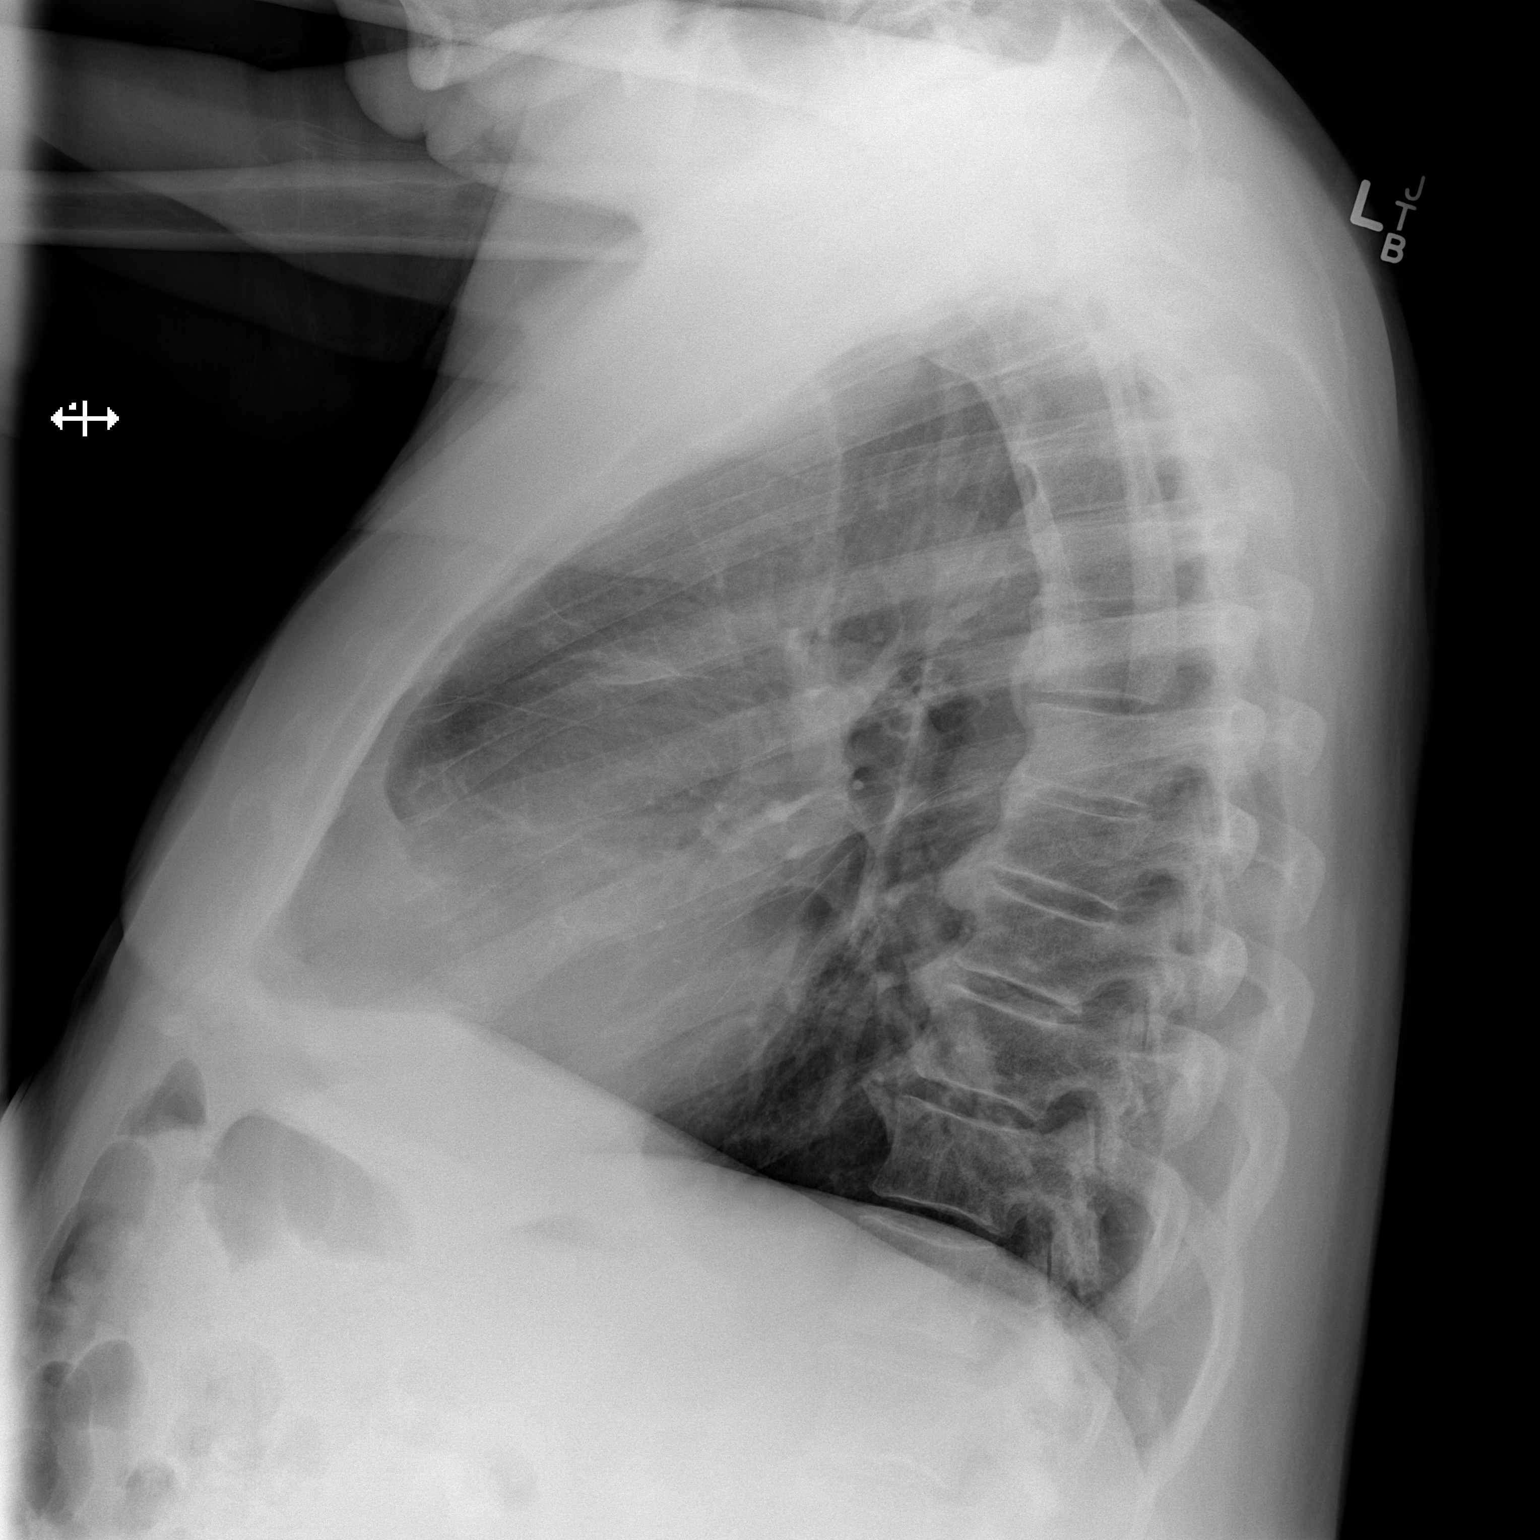

[2 of 2 positions shown; findings below may reference images not displayed]

FINDINGS: The lungs are adequately inflated. There is no focal infiltrate.
There is linear increased density in the right perihilar region
which suggests subsegmental atelectasis or scarring. The heart and
pulmonary vascularity are normal. There is calcification in the wall
of the thoracic aorta. The bony thorax exhibits no acute
abnormality.
IMPRESSION: Minimal perihilar subsegmental atelectasis or scarring on the right.
No CHF nor other acute cardiopulmonary abnormality.

Thoracic aortic atherosclerosis.

## 2019-04-07 ENCOUNTER — Other Ambulatory Visit (HOSPITAL_COMMUNITY): Payer: Self-pay | Admitting: Physician Assistant

## 2019-04-07 DIAGNOSIS — I739 Peripheral vascular disease, unspecified: Secondary | ICD-10-CM

## 2019-04-11 IMAGING — DX DG CHEST 1V PORT
1 series · 1 of 1 positions shown · non-contrast
Comparison: Chest x-ray 12/09/2016

CLINICAL DATA: Status post transcatheter aortic valve replacement.

EXAM:
PORTABLE CHEST 1 VIEW

[chest]
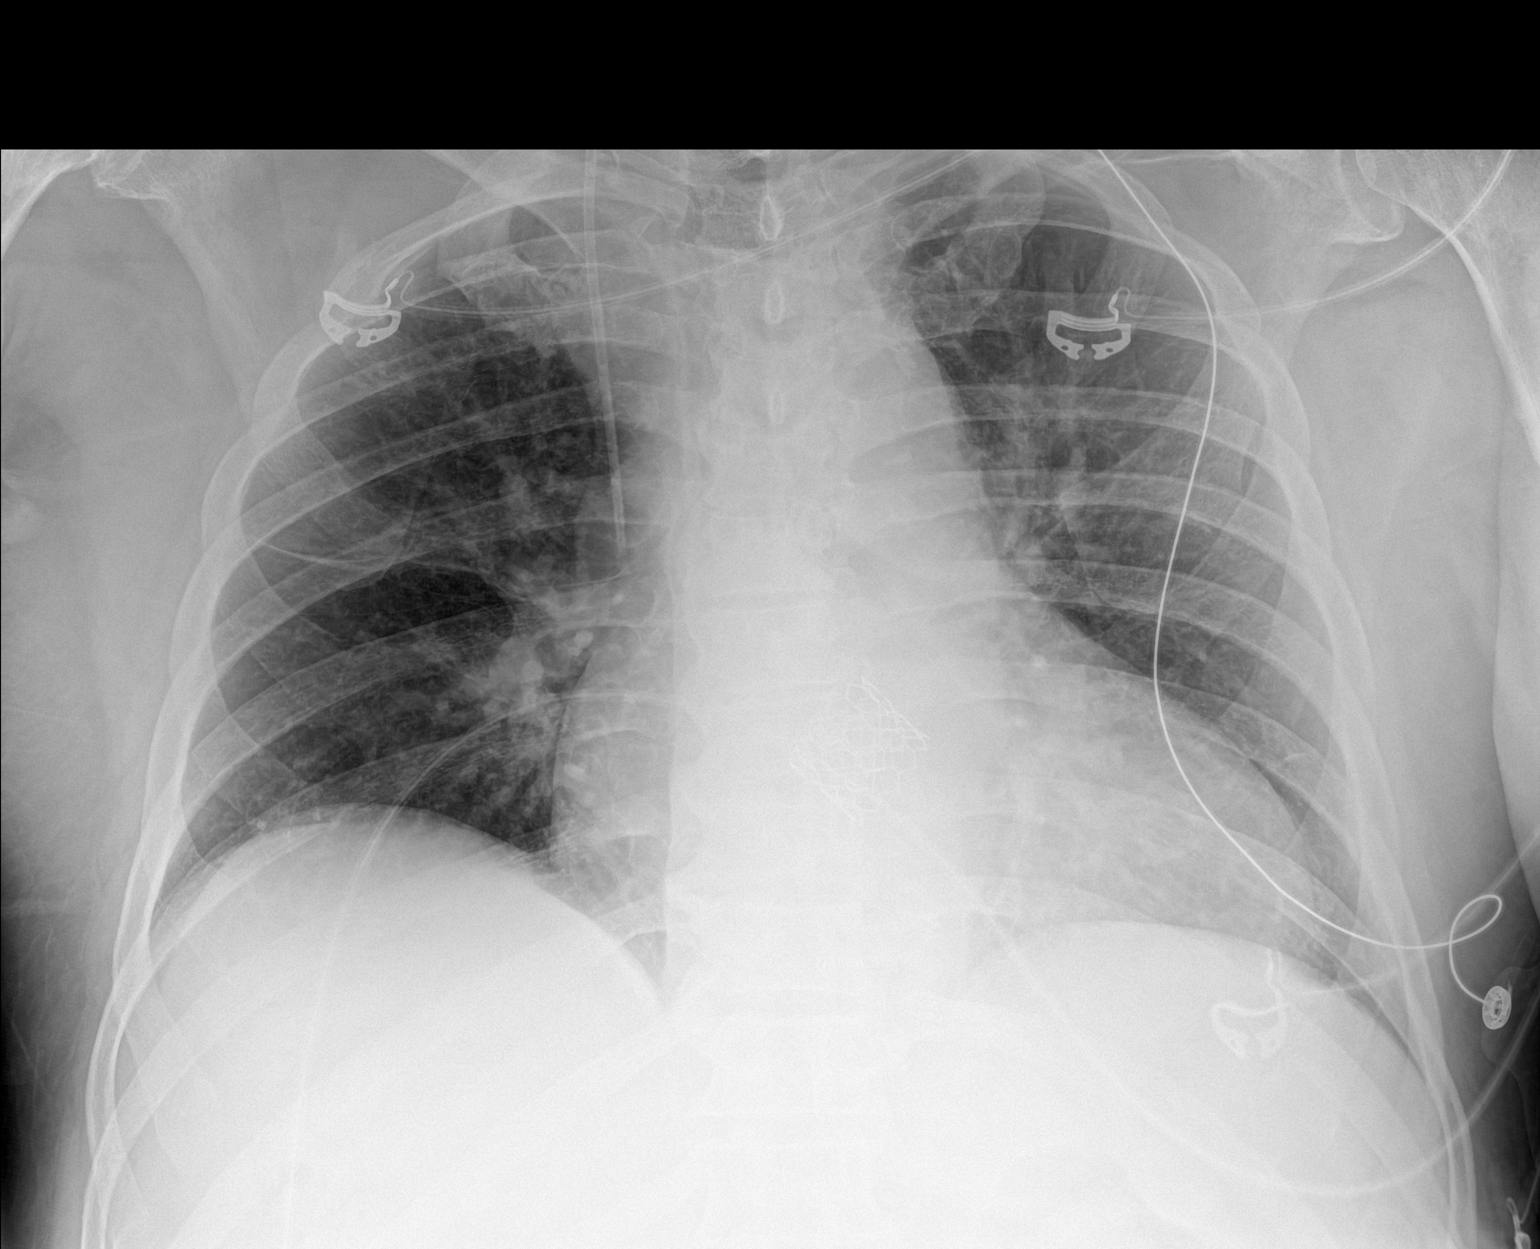

[1 of 1 positions shown; findings below may reference images not displayed]

FINDINGS: Aortic valve is noted. The right IJ center venous catheter tip is in
the mid distal SVC. The cardiac silhouette, mediastinal and hilar
contours are within normal limits given the AP projection. Streaky
areas of atelectasis but no edema, effusions or pneumothorax.
IMPRESSION: Postoperative changes with a aortic valve in place. No complicating
features are demonstrated.

Streaky areas of subsegmental atelectasis but no other significant
pulmonary findings.

## 2019-04-22 ENCOUNTER — Ambulatory Visit: Payer: Medicare Other | Attending: Internal Medicine

## 2019-04-22 DIAGNOSIS — Z23 Encounter for immunization: Secondary | ICD-10-CM | POA: Insufficient documentation

## 2019-04-22 NOTE — Progress Notes (Signed)
   Covid-19 Vaccination Clinic  Name:  Subhash Upshur    MRN: [id number] DOB: 21-Oct-1933  04/22/2019  Mr. Yoho was observed post Covid-19 immunization for 15 minutes without incidence. He was provided with Vaccine Information Sheet and instruction to access the V-Safe system.   Mr. Royal was instructed to call 911 with any severe reactions post vaccine: Marland Kitchen Difficulty breathing  . Swelling of your face and throat  . A fast heartbeat  . A bad rash all over your body  . Dizziness and weakness    Immunizations Administered    Name Date Dose VIS Date Route   Pfizer COVID-19 Vaccine 04/22/2019 12:04 PM 0.3 mL 03/10/2019 Intramuscular   Manufacturer: [place]   Lot: [id number]   Theresa: [id number]

## 2019-05-03 ENCOUNTER — Other Ambulatory Visit: Payer: Self-pay

## 2019-05-03 ENCOUNTER — Encounter: Payer: Self-pay | Admitting: Physician Assistant

## 2019-05-03 ENCOUNTER — Ambulatory Visit (INDEPENDENT_AMBULATORY_CARE_PROVIDER_SITE_OTHER): Payer: Medicare HMO | Admitting: Physician Assistant

## 2019-05-03 VITALS — Ht 72.0 in | Wt 229.0 lb

## 2019-05-03 DIAGNOSIS — M869 Osteomyelitis, unspecified: Secondary | ICD-10-CM

## 2019-05-03 NOTE — Progress Notes (Signed)
Office Visit Note   Patient: Robert Horn           Date of Birth: [birth year]           MRN: [id number] Visit Date: 05/03/2019              Requested by: [name], MD [address] [place],  [place] [address] PCP: [name], MD  Chief Complaint  Patient presents with  . Right Foot - Routine Post Op    02/13/19 right foot 1st ray amputation         HPI This is a pleasant gentleman who is here to follow-up on his right foot he is status post ray amputation he is actually doing well.  He has been wearing his compression socks.  He takes a break from them at night.  He is wondering if he can discontinue his [place] he does take aspirin   Assessment & Plan: Visit Diagnoses: No diagnosis found.  Plan: Follow-up in about 6 weeks.  He should continue to wear his socks full-time during the day  Follow-Up Instructions: No follow-ups on file.   Ortho Exam  Patient is alert, oriented, no adenopathy, well-dressed, normal affect, normal respiratory effort. Focused examination demonstrates very very small eschar along the incision line no surrounding erythema no drainage swelling is actually quite well controlled down his leg into his foot and toes.  No fluctuance no foul odor  Imaging: No results found. No images are attached to the encounter.  Labs: Lab Results  Component Value Date   HGBA1C 7.0 (H) 01/31/2019   HGBA1C 6.4 (H) 12/13/2017   HGBA1C 6.4 (H) 12/09/2016   REPTSTATUS 02/04/2019 FINAL 01/30/2019   CULT  01/30/2019    NO GROWTH 5 DAYS Performed at [facility], [place] [address], [place], [place] [address]      Lab Results  Component Value Date   ALBUMIN 2.3 (L) 02/06/2019   ALBUMIN 2.2 (L) 02/05/2019   ALBUMIN 2.2 (L) 02/04/2019    Lab Results  Component Value Date   MG 2.1 02/06/2019   MG 2.3 02/05/2019   MG 2.3 02/04/2019   No results found for: VD25OH  No results found for: PREALBUMIN CBC EXTENDED Latest Ref Rng &  Units 02/10/2019 02/09/2019 02/08/2019  WBC 4.0 - 10.5 K/uL 6.0 6.6 7.4  RBC 4.22 - 5.81 MIL/uL 2.95(L) 2.92(L) 2.91(L)  HGB 13.0 - 17.0 g/dL 8.7(L) 8.7(L) 8.5(L)  HCT 39.0 - 52.0 % 27.6(L) 27.1(L) 26.9(L)  PLT 150 - 400 K/uL 336 325 325  NEUTROABS 1.7 - 7.7 K/uL - - -  LYMPHSABS 0.7 - 4.0 K/uL - - -     Body mass index is 31.06 kg/m.  Orders:  No orders of the defined types were placed in this encounter.  No orders of the defined types were placed in this encounter.    Procedures: No procedures performed  Clinical Data: No additional findings.  ROS:  All other systems negative, except as noted in the HPI. Review of Systems  Objective: Vital Signs: Ht 6' (1.829 m)   Wt 229 lb (103.9 kg)   BMI 31.06 kg/m   Specialty Comments:  No specialty comments available.  PMFS History: Patient Active Problem List   Diagnosis Date Noted  . Osteomyelitis of great toe of right foot ([place])   . Severe protein-calorie malnutrition ([place])   . Diabetic polyneuropathy associated with type 2 diabetes mellitus ([place])   . Cutaneous abscess of right foot   .  Cellulitis of right foot 01/30/2019  . CKD (chronic kidney disease), stage III 01/30/2019  . Elevated LFTs 01/30/2019  . Colon cancer ([place]) 01/30/2019  . Anemia of chronic disease 01/30/2019  . Acute on chronic combined systolic and diastolic CHF (congestive heart failure) ([place]) 03/09/2018  . AKI (acute kidney injury) ([place]) 03/09/2018  . Colonic mass 12/10/2017  . S/P TAVR (transcatheter aortic valve replacement) 03/03/2017  . Severe aortic stenosis 12/15/2016  . Essential hypertension 05/09/2013  . Hyperlipidemia 05/09/2013  . PVC's (premature ventricular contractions) 05/09/2013  . Chronic combined systolic and diastolic heart failure ([place]) 05/09/2013   Past Medical History:  Diagnosis Date  . CHF (congestive heart failure) ([place])   . Dyslipidemia 10/27/2015  . Dyspnea    w/ exertion   . Elevated PSA 10/27/2015  .  Erectile dysfunction 10/27/2015  . Heart murmur   . Hypertension 10/27/2015  . Hypogonadism male 10/27/2015  . Obesity 10/27/2015  . Pneumonia 10/27/2015   pt states was 1982  . Rotator cuff tear 10/27/2015    Family History  Problem Relation Age of Onset  . Diabetes Mother   . Heart disease Mother   . Pulmonary embolism Father     Past Surgical History:  Procedure Laterality Date  . ABDOMINAL AORTOGRAM N/A 02/01/2019   Procedure: ABDOMINAL AORTOGRAM;  Surgeon: Marty Heck, MD;  Location: [facility];  Service: Cardiovascular;  Laterality: N/A;  . AMPUTATION Right 02/03/2019   Procedure: RIGHT FOOT 1ST RAY AMPUTATION;  Surgeon: [name], MD;  Location: [facility];  Service: Orthopedics;  Laterality: Right;  . CARDIAC CATHETERIZATION N/A 11/14/2015   Procedure: Left Heart Cath and Coronary Angiography;  Surgeon: [name], MD;  Location: [facility];  Service: Cardiovascular;  Laterality: N/A;  . LAPAROSCOPIC PARTIAL COLECTOMY  12/15/2017   LAPAROSCOPIC PARTIAL COLECTOMY (N/A Abdomen)  . LAPAROSCOPIC PARTIAL COLECTOMY N/A 12/15/2017   Procedure: LAPAROSCOPIC PARTIAL COLECTOMY;  Surgeon: Erroll Luna, MD;  Location: [facility];  Service: General;  Laterality: N/A;  . LOWER EXTREMITY ANGIOGRAPHY Right 02/01/2019   Procedure: LOWER EXTREMITY ANGIOGRAPHY;  Surgeon: Marty Heck, MD;  Location: [facility];  Service: Cardiovascular;  Laterality: Right;  . PERIPHERAL VASCULAR INTERVENTION Right 02/01/2019   Procedure: PERIPHERAL VASCULAR INTERVENTION;  Surgeon: Marty Heck, MD;  Location: [facility];  Service: Cardiovascular;  Laterality: Right;  SFA  . TEE WITHOUT CARDIOVERSION N/A 12/15/2016   Procedure: TRANSESOPHAGEAL ECHOCARDIOGRAM (TEE);  Surgeon: Sherren Mocha, MD;  Location: [facility];  Service: Open Heart Surgery;  Laterality: N/A;  . TRANSCATHETER AORTIC VALVE REPLACEMENT, TRANSFEMORAL N/A 12/15/2016   Procedure: TRANSCATHETER  AORTIC VALVE REPLACEMENT, TRANSFEMORAL;  Surgeon: Sherren Mocha, MD;  Location: [facility];  Service: Open Heart Surgery;  Laterality: N/A;   Social History   Occupational History  . Not on file  Tobacco Use  . Smoking status: Former Smoker    Types: Cigarettes  . Smokeless tobacco: Never Used  Substance and Sexual Activity  . Alcohol use: Yes    Comment: rarely  . Drug use: No  . Sexual activity: Not on file

## 2019-05-09 ENCOUNTER — Other Ambulatory Visit: Payer: Self-pay | Admitting: Physician Assistant

## 2019-05-10 DIAGNOSIS — E7800 Pure hypercholesterolemia, unspecified: Secondary | ICD-10-CM | POA: Diagnosis not present

## 2019-05-10 DIAGNOSIS — I5040 Unspecified combined systolic (congestive) and diastolic (congestive) heart failure: Secondary | ICD-10-CM | POA: Diagnosis not present

## 2019-05-10 DIAGNOSIS — E1159 Type 2 diabetes mellitus with other circulatory complications: Secondary | ICD-10-CM | POA: Diagnosis not present

## 2019-05-10 DIAGNOSIS — E1169 Type 2 diabetes mellitus with other specified complication: Secondary | ICD-10-CM | POA: Diagnosis not present

## 2019-05-10 DIAGNOSIS — C182 Malignant neoplasm of ascending colon: Secondary | ICD-10-CM | POA: Diagnosis not present

## 2019-05-10 DIAGNOSIS — N401 Enlarged prostate with lower urinary tract symptoms: Secondary | ICD-10-CM | POA: Diagnosis not present

## 2019-05-10 DIAGNOSIS — D509 Iron deficiency anemia, unspecified: Secondary | ICD-10-CM | POA: Diagnosis not present

## 2019-05-10 DIAGNOSIS — C184 Malignant neoplasm of transverse colon: Secondary | ICD-10-CM | POA: Diagnosis not present

## 2019-05-10 DIAGNOSIS — I10 Essential (primary) hypertension: Secondary | ICD-10-CM | POA: Diagnosis not present

## 2019-05-13 ENCOUNTER — Ambulatory Visit: Payer: Medicare HMO | Attending: Internal Medicine

## 2019-05-13 DIAGNOSIS — Z23 Encounter for immunization: Secondary | ICD-10-CM

## 2019-05-13 NOTE — Progress Notes (Signed)
   Covid-19 Vaccination Clinic  Name:  Robert Horn    MRN: [id number] DOB: 10-Nov-1933  05/13/2019  Mr. Canterberry was observed post Covid-19 immunization for 30 minutes based on pre-vaccination screening without incidence. He was provided with Vaccine Information Sheet and instruction to access the V-Safe system.   Mr. Roosevelt was instructed to call 911 with any severe reactions post vaccine: Marland Kitchen Difficulty breathing  . Swelling of your face and throat  . A fast heartbeat  . A bad rash all over your body  . Dizziness and weakness    Immunizations Administered    Name Date Dose VIS Date Route   Pfizer COVID-19 Vaccine 05/13/2019 11:46 AM 0.3 mL 03/10/2019 Intramuscular   Manufacturer: [place]   Lot: [id number]   [place]: [id number]

## 2019-05-28 DIAGNOSIS — I5040 Unspecified combined systolic (congestive) and diastolic (congestive) heart failure: Secondary | ICD-10-CM | POA: Diagnosis not present

## 2019-05-28 DIAGNOSIS — E7800 Pure hypercholesterolemia, unspecified: Secondary | ICD-10-CM | POA: Diagnosis not present

## 2019-05-28 DIAGNOSIS — I10 Essential (primary) hypertension: Secondary | ICD-10-CM | POA: Diagnosis not present

## 2019-05-28 DIAGNOSIS — E1159 Type 2 diabetes mellitus with other circulatory complications: Secondary | ICD-10-CM | POA: Diagnosis not present

## 2019-05-28 DIAGNOSIS — N401 Enlarged prostate with lower urinary tract symptoms: Secondary | ICD-10-CM | POA: Diagnosis not present

## 2019-05-28 DIAGNOSIS — E1169 Type 2 diabetes mellitus with other specified complication: Secondary | ICD-10-CM | POA: Diagnosis not present

## 2019-05-28 DIAGNOSIS — D509 Iron deficiency anemia, unspecified: Secondary | ICD-10-CM | POA: Diagnosis not present

## 2019-05-28 DIAGNOSIS — C184 Malignant neoplasm of transverse colon: Secondary | ICD-10-CM | POA: Diagnosis not present

## 2019-05-28 DIAGNOSIS — C182 Malignant neoplasm of ascending colon: Secondary | ICD-10-CM | POA: Diagnosis not present

## 2019-06-05 DIAGNOSIS — E1165 Type 2 diabetes mellitus with hyperglycemia: Secondary | ICD-10-CM | POA: Diagnosis not present

## 2019-06-05 DIAGNOSIS — I10 Essential (primary) hypertension: Secondary | ICD-10-CM | POA: Diagnosis not present

## 2019-06-05 DIAGNOSIS — E1159 Type 2 diabetes mellitus with other circulatory complications: Secondary | ICD-10-CM | POA: Diagnosis not present

## 2019-06-05 DIAGNOSIS — E7800 Pure hypercholesterolemia, unspecified: Secondary | ICD-10-CM | POA: Diagnosis not present

## 2019-06-14 ENCOUNTER — Other Ambulatory Visit: Payer: Self-pay

## 2019-06-14 ENCOUNTER — Ambulatory Visit: Payer: Medicare HMO | Admitting: Physician Assistant

## 2019-06-14 ENCOUNTER — Encounter: Payer: Self-pay | Admitting: Physician Assistant

## 2019-06-14 VITALS — Ht 72.0 in | Wt 229.0 lb

## 2019-06-14 DIAGNOSIS — M869 Osteomyelitis, unspecified: Secondary | ICD-10-CM

## 2019-06-14 NOTE — Progress Notes (Signed)
Office Visit Note   Patient: Robert Horn           Date of Birth: 01/30/1934           MRN: [id number] Visit Date: 06/14/2019              Requested by: [name], MD [address] [place],  [place] [address] PCP: [name], MD  Chief Complaint  Patient presents with  . Right Foot - Follow-up    02/13/19 right foot 1st ray amputation       HPI: This is a pleasant gentleman who is now 4 months status post right foot first ray amputation.  He has been walking a lot and using his VIVE compression socks.  He is in the process of having custom shoes made.  He does get swelling when he takes the sock off or with prolonged activity he wants to make sure that it is okay for him to walk  Assessment & Plan: Visit Diagnoses: No diagnosis found.  Plan: We discussed fillers and he is in the process of getting a new shoe.  I did give him a gel toe sleeve for his claw toe.  Told him he could try this but if it irritated things certainly he did not have to use it.  He will follow-up in 2 months and would like to do this with Dr. Sharol Given     Follow-Up Instructions: No follow-ups on file.   Ortho Exam  Patient is alert, oriented, no adenopathy, well-dressed, normal affect, normal respiratory effort. Focused examination demonstrates well-healed surgical incision.  There was 1 thickened scab at the proximal end of the incision this was peeled off easily to the reveal fresh healthy skin.  There was no drainage no surrounding erythema or fluctuance.  He also has a fixed second claw toe deformity.  From shoewear he does have a thickened scab over the PIP joint.  This tube was removed and had healthy skin beneath with just a spot of serous drainage no foul odor  Imaging: No results found. No images are attached to the encounter.  Labs: Lab Results  Component Value Date   HGBA1C 7.0 (H) 01/31/2019   HGBA1C 6.4 (H) 12/13/2017   HGBA1C 6.4 (H) 12/09/2016   REPTSTATUS  02/04/2019 FINAL 01/30/2019   CULT  01/30/2019    NO GROWTH 5 DAYS Performed at [facility], [place] [address], [place], [place] [address]      Lab Results  Component Value Date   ALBUMIN 2.3 (L) 02/06/2019   ALBUMIN 2.2 (L) 02/05/2019   ALBUMIN 2.2 (L) 02/04/2019    Lab Results  Component Value Date   MG 2.1 02/06/2019   MG 2.3 02/05/2019   MG 2.3 02/04/2019   No results found for: VD25OH  No results found for: PREALBUMIN CBC EXTENDED Latest Ref Rng & Units 02/10/2019 02/09/2019 02/08/2019  WBC 4.0 - 10.5 K/uL 6.0 6.6 7.4  RBC 4.22 - 5.81 MIL/uL 2.95(L) 2.92(L) 2.91(L)  HGB 13.0 - 17.0 g/dL 8.7(L) 8.7(L) 8.5(L)  HCT 39.0 - 52.0 % 27.6(L) 27.1(L) 26.9(L)  PLT 150 - 400 K/uL 336 325 325  NEUTROABS 1.7 - 7.7 K/uL - - -  LYMPHSABS 0.7 - 4.0 K/uL - - -     Body mass index is 31.06 kg/m.  Orders:  No orders of the defined types were placed in this encounter.  No orders of the defined types were placed in this encounter.  Procedures: No procedures performed  Clinical Data: No additional findings.  ROS:  All other systems negative, except as noted in the HPI. Review of Systems  Objective: Vital Signs: Ht 6' (1.829 m)   Wt 229 lb (103.9 kg)   BMI 31.06 kg/m   Specialty Comments:  No specialty comments available.  PMFS History: Patient Active Problem List   Diagnosis Date Noted  . Osteomyelitis of great toe of right foot ([place])   . Severe protein-calorie malnutrition ([place])   . Diabetic polyneuropathy associated with type 2 diabetes mellitus ([place])   . Cutaneous abscess of right foot   . Cellulitis of right foot 01/30/2019  . CKD (chronic kidney disease), stage III 01/30/2019  . Elevated LFTs 01/30/2019  . Colon cancer ([place]) 01/30/2019  . Anemia of chronic disease 01/30/2019  . Acute on chronic combined systolic and diastolic CHF (congestive heart failure) ([place]) 03/09/2018  . AKI (acute kidney injury) ([place]) 03/09/2018  . Colonic mass 12/10/2017    . S/P TAVR (transcatheter aortic valve replacement) 03/03/2017  . Severe aortic stenosis 12/15/2016  . Essential hypertension 05/09/2013  . Hyperlipidemia 05/09/2013  . PVC's (premature ventricular contractions) 05/09/2013  . Chronic combined systolic and diastolic heart failure ([place]) 05/09/2013   Past Medical History:  Diagnosis Date  . CHF (congestive heart failure) ([place])   . Dyslipidemia 10/27/2015  . Dyspnea    w/ exertion   . Elevated PSA 10/27/2015  . Erectile dysfunction 10/27/2015  . Heart murmur   . Hypertension 10/27/2015  . Hypogonadism male 10/27/2015  . Obesity 10/27/2015  . Pneumonia 10/27/2015   pt states was 1982  . Rotator cuff tear 10/27/2015    Family History  Problem Relation Age of Onset  . Diabetes Mother   . Heart disease Mother   . Pulmonary embolism Father     Past Surgical History:  Procedure Laterality Date  . ABDOMINAL AORTOGRAM N/A 02/01/2019   Procedure: ABDOMINAL AORTOGRAM;  Surgeon: Marty Heck, MD;  Location: [facility];  Service: Cardiovascular;  Laterality: N/A;  . AMPUTATION Right 02/03/2019   Procedure: RIGHT FOOT 1ST RAY AMPUTATION;  Surgeon: [name], MD;  Location: [facility];  Service: Orthopedics;  Laterality: Right;  . CARDIAC CATHETERIZATION N/A 11/14/2015   Procedure: Left Heart Cath and Coronary Angiography;  Surgeon: [name], MD;  Location: [facility];  Service: Cardiovascular;  Laterality: N/A;  . LAPAROSCOPIC PARTIAL COLECTOMY  12/15/2017   LAPAROSCOPIC PARTIAL COLECTOMY (N/A Abdomen)  . LAPAROSCOPIC PARTIAL COLECTOMY N/A 12/15/2017   Procedure: LAPAROSCOPIC PARTIAL COLECTOMY;  Surgeon: Erroll Luna, MD;  Location: [facility];  Service: General;  Laterality: N/A;  . LOWER EXTREMITY ANGIOGRAPHY Right 02/01/2019   Procedure: LOWER EXTREMITY ANGIOGRAPHY;  Surgeon: Marty Heck, MD;  Location: [facility];  Service: Cardiovascular;  Laterality: Right;  . PERIPHERAL VASCULAR INTERVENTION  Right 02/01/2019   Procedure: PERIPHERAL VASCULAR INTERVENTION;  Surgeon: Marty Heck, MD;  Location: [facility];  Service: Cardiovascular;  Laterality: Right;  SFA  . TEE WITHOUT CARDIOVERSION N/A 12/15/2016   Procedure: TRANSESOPHAGEAL ECHOCARDIOGRAM (TEE);  Surgeon: Sherren Mocha, MD;  Location: [facility];  Service: Open Heart Surgery;  Laterality: N/A;  . TRANSCATHETER AORTIC VALVE REPLACEMENT, TRANSFEMORAL N/A 12/15/2016   Procedure: TRANSCATHETER AORTIC VALVE REPLACEMENT, TRANSFEMORAL;  Surgeon: Sherren Mocha, MD;  Location: [facility];  Service: Open Heart Surgery;  Laterality: N/A;   Social History   Occupational History  . Not on file  Tobacco Use  .  Smoking status: Former Smoker    Types: Cigarettes  . Smokeless tobacco: Never Used  Substance and Sexual Activity  . Alcohol use: Yes    Comment: rarely  . Drug use: No  . Sexual activity: Not on file

## 2019-06-15 DIAGNOSIS — H903 Sensorineural hearing loss, bilateral: Secondary | ICD-10-CM | POA: Diagnosis not present

## 2019-06-20 ENCOUNTER — Encounter: Payer: Self-pay | Admitting: Family

## 2019-06-20 ENCOUNTER — Other Ambulatory Visit: Payer: Self-pay

## 2019-06-20 ENCOUNTER — Ambulatory Visit: Payer: Medicare HMO | Admitting: Family

## 2019-06-20 VITALS — Ht 72.0 in | Wt 229.0 lb

## 2019-06-20 DIAGNOSIS — M2041 Other hammer toe(s) (acquired), right foot: Secondary | ICD-10-CM

## 2019-06-20 DIAGNOSIS — L97511 Non-pressure chronic ulcer of other part of right foot limited to breakdown of skin: Secondary | ICD-10-CM

## 2019-06-20 MED ORDER — DOXYCYCLINE HYCLATE 100 MG PO TABS: 100.0000 mg | ORAL_TABLET | Freq: Two times a day (BID) | ORAL | 0 refills | Status: DC

## 2019-06-20 NOTE — Progress Notes (Signed)
Cardiology Office Note:    Date:  06/21/2019   ID:  Robert Horn, DOB 15-May-1933, MRN [id number]  PCP:  [name], MD  Cardiologist:  Sinclair Grooms, MD   Referring MD: [name], MD   Chief Complaint  Patient presents with  . Coronary Artery Disease  . Congestive Heart Failure    History of Present Illness:    Robert Horn is a 86 y.o. male with a hx of chronic combined S/D CHF, HTN, HLD, colon cancerand severe aortic stenosis s/p TAVR (12/15/16).EF was down, 25-30,in setting of severe aortic stenosis. About a monthafter TAVR echocardiogram showed improvement in EF to 45-50% with mean gradient of 12 mmmHg.Most recent LVEF 35 to 40%.S/P amputation right great toe and now with ulcer second toe.  [name] is doing well from cardiac standpoint.  No shortness of breath, orthopnea, edema, PND, or significant palpitations.  He denies chest pain.  He is not had syncope.  He is able to sleep without awakening or having issues.  He recently developed an ulcer on the second toe of the right foot.  The great toe was amputated because of osteomyelitis.  There are also developed possibly because of pressure related to the type shoes that he wears.  He has been trying to walk for exercise and noticed 1 day after going on a 2 mile walk that the toe was red and swollen.  It subsequently opened up and is now being treated with antibiotics under the direction of Dr. Sharol Given.  He is compliant with his current medical regimen.  He brought his home list.   Past Medical History:  Diagnosis Date  . CHF (congestive heart failure) ([place])   . Dyslipidemia 10/27/2015  . Dyspnea    w/ exertion   . Elevated PSA 10/27/2015  . Erectile dysfunction 10/27/2015  . Heart murmur   . Hypertension 10/27/2015  . Hypogonadism male 10/27/2015  . Obesity 10/27/2015  . Pneumonia 10/27/2015   pt states was 1982  . Rotator cuff tear 10/27/2015    Past Surgical History:  Procedure Laterality Date   . ABDOMINAL AORTOGRAM N/A 02/01/2019   Procedure: ABDOMINAL AORTOGRAM;  Surgeon: Marty Heck, MD;  Location: [facility];  Service: Cardiovascular;  Laterality: N/A;  . AMPUTATION Right 02/03/2019   Procedure: RIGHT FOOT 1ST RAY AMPUTATION;  Surgeon: [name], MD;  Location: [facility];  Service: Orthopedics;  Laterality: Right;  . CARDIAC CATHETERIZATION N/A 11/14/2015   Procedure: Left Heart Cath and Coronary Angiography;  Surgeon: [name], MD;  Location: [facility];  Service: Cardiovascular;  Laterality: N/A;  . LAPAROSCOPIC PARTIAL COLECTOMY  12/15/2017   LAPAROSCOPIC PARTIAL COLECTOMY (N/A Abdomen)  . LAPAROSCOPIC PARTIAL COLECTOMY N/A 12/15/2017   Procedure: LAPAROSCOPIC PARTIAL COLECTOMY;  Surgeon: Erroll Luna, MD;  Location: [facility];  Service: General;  Laterality: N/A;  . LOWER EXTREMITY ANGIOGRAPHY Right 02/01/2019   Procedure: LOWER EXTREMITY ANGIOGRAPHY;  Surgeon: Marty Heck, MD;  Location: [facility];  Service: Cardiovascular;  Laterality: Right;  . PERIPHERAL VASCULAR INTERVENTION Right 02/01/2019   Procedure: PERIPHERAL VASCULAR INTERVENTION;  Surgeon: Marty Heck, MD;  Location: [facility];  Service: Cardiovascular;  Laterality: Right;  SFA  . TEE WITHOUT CARDIOVERSION N/A 12/15/2016   Procedure: TRANSESOPHAGEAL ECHOCARDIOGRAM (TEE);  Surgeon: Sherren Mocha, MD;  Location: [facility];  Service: Open Heart Surgery;  Laterality: N/A;  . TRANSCATHETER AORTIC VALVE REPLACEMENT, TRANSFEMORAL N/A 12/15/2016   Procedure: TRANSCATHETER AORTIC VALVE  REPLACEMENT, TRANSFEMORAL;  Surgeon: Sherren Mocha, MD;  Location: [facility];  Service: Open Heart Surgery;  Laterality: N/A;    Current Medications: Current Meds  Medication Sig  . amLODipine (NORVASC) 10 MG tablet TAKE 1 TABLET BY MOUTH EVERY DAY  . aspirin (ASPIRIN EC) 81 MG EC tablet Take 81 mg by mouth daily. Swallow whole.  . carvedilol (COREG) 3.125 MG tablet TAKE 1 TABLET  (3.125 MG TOTAL) BY MOUTH 2 (TWO) TIMES DAILY.  Marland Kitchen clopidogrel (PLAVIX) 75 MG tablet Take 1 tablet (75 mg total) by mouth daily with breakfast.  . doxycycline (VIBRA-TABS) 100 MG tablet Take 1 tablet (100 mg total) by mouth 2 (two) times daily.  . ferrous sulfate 325 (65 FE) MG tablet Take 325 mg by mouth daily with breakfast.  . furosemide (LASIX) 40 MG tablet TAKE 1.5 TABLETS BY MOUTH DAILY.  Marland Kitchen glimepiride (AMARYL) 1 MG tablet Take 1 mg by mouth every morning.  . Multiple Vitamin (MULTIVITAMIN WITH MINERALS) TABS tablet Take 1 tablet by mouth daily.  Marland Kitchen omega-3 acid ethyl esters (LOVAZA) 1 g capsule TAKE 1 CAPSULE BY MOUTH EVERY DAY  . pentoxifylline (TRENTAL) 400 MG CR tablet TAKE 1 TABLET (400 MG TOTAL) BY MOUTH 3 (THREE) TIMES DAILY WITH MEALS.  . rosuvastatin (CRESTOR) 10 MG tablet Take 10 mg by mouth daily.  . sacubitril-valsartan (ENTRESTO) 49-51 MG Take 1 tablet by mouth 2 (two) times daily.  Marland Kitchen VITAMIN B COMPLEX-C PO Take 1 tablet by mouth daily.     Allergies:   Patient has no known allergies.   Social History   Socioeconomic History  . Marital status: Single    Spouse name: Not on file  . Number of children: Not on file  . Years of education: Not on file  . Highest education level: Not on file  Occupational History  . Not on file  Tobacco Use  . Smoking status: Former Smoker    Types: Cigarettes  . Smokeless tobacco: Never Used  Substance and Sexual Activity  . Alcohol use: Yes    Comment: rarely  . Drug use: No  . Sexual activity: Not on file  Other Topics Concern  . Not on file  Social History Narrative  . Not on file   Social Determinants of Health   Financial Resource Strain:   . Difficulty of Paying Living Expenses:   Food Insecurity:   . Worried About Charity fundraiser in the Last Year:   . Arboriculturist in the Last Year:   Transportation Needs:   . Film/video editor (Medical):   Marland Kitchen Lack of Transportation (Non-Medical):   Physical Activity:    . Days of Exercise per Week:   . Minutes of Exercise per Session:   Stress:   . Feeling of Stress :   Social Connections:   . Frequency of Communication with Friends and Family:   . Frequency of Social Gatherings with Friends and Family:   . Attends Religious Services:   . Active Member of Clubs or Organizations:   . Attends Archivist Meetings:   Marland Kitchen Marital Status:      Family History: The patient's family history includes Diabetes in his mother; Heart disease in his mother; Pulmonary embolism in his father.  ROS:   Please see the history of present illness.    Glimepiride was started for his diabetes.  This is being managed by Dr. Delfina Redwood.  All other systems reviewed and are negative.  EKGs/Labs/Other Studies Reviewed:  The following studies were reviewed today: BILATERAL LE Doppler 02/2019 Summary:  Right: Resting right ankle-brachial index is within normal range. No  evidence of significant right lower extremity arterial disease.   Left: Resting left ankle-brachial index indicates moderate left lower  extremity arterial disease. The left toe-brachial index is abnormal.    EKG:  EKG performed today demonstrates normal sinus rhythm, vertical axis, ventricular bigeminy, QS pattern V1 through V4.  No acute ST-T wave changes noted.  In comparison to EKG performed in February 2020, PVC activity is more frequent.  Recent Labs: 11/24/2018: NT-Pro BNP 294 02/06/2019: ALT 28; Magnesium 2.1 02/10/2019: BUN 17; Creatinine, Ser 1.38; Hemoglobin 8.7; Platelets 336; Potassium 4.4; Sodium 140  Recent Lipid Panel    Component Value Date/Time   CHOL 110 01/30/2019 2350   TRIG 87 01/30/2019 2350   HDL 22 (L) 01/30/2019 2350   CHOLHDL 5.0 01/30/2019 2350   VLDL 17 01/30/2019 2350   LDLCALC 71 01/30/2019 2350    Physical Exam:    VS:  BP 122/72   Pulse 70   Ht 6' (1.829 m)   Wt 227 lb (103 kg)   SpO2 97%   BMI 30.79 kg/m     Wt Readings from Last 3 Encounters:   06/21/19 227 lb (103 kg)  06/20/19 229 lb (103.9 kg)  06/14/19 229 lb (103.9 kg)     GEN: Obese.  Appears younger than stated age.  No acute distress HEENT: Normal NECK: No JVD. LYMPHATICS: No lymphadenopathy CARDIAC: Irregular RR with 2/6 crescendo decrescendo systolic murmur and no obvious gallop, or edema. VASCULAR:  Normal Pulses. No bruits. RESPIRATORY:  Clear to auscultation without rales, wheezing or rhonchi  ABDOMEN: Soft, non-tender, non-distended, No pulsatile mass, MUSCULOSKELETAL: No deformity  SKIN: Warm and dry NEUROLOGIC:  Alert and oriented x 3 PSYCHIATRIC:  Normal affect   ASSESSMENT:    1. S/P TAVR (transcatheter aortic valve replacement)   2. PAD (peripheral artery disease) ([place])   3. Chronic combined systolic and diastolic heart failure ([place])   4. CKD (chronic kidney disease) stage 2, GFR 60-89 ml/min   5. Essential hypertension   6. Mixed hyperlipidemia   7. Amputation of right great toe ([place])   8. Skin ulcer of second toe of right foot, limited to breakdown of skin ([place])   9. Educated about COVID-19 virus infection    PLAN:    In order of problems listed above:  1. Normal function of his TAVR valve. 2. Recent ankle-brachial index revealed normal right lower extremity index and mildly decreased left lower extremity index.  Duplex demonstrated patency of the right lower extremity stent. 3. No clinical evidence of volume overload.  Will speak with Dr. Delfina Redwood to determine if he will be possible to use an SGLT2 instead of glimepiride for sugar control. 4. An SGLT 2 will provide renal protection as well 5. Blood pressure is excellent.  Target at his age is 140/80 mmHg. 6. LDL target should be less than 70.  Statin intensity needs to be increased especially given lower extremity PAD. 7. Still having difficulty with his right lower extremity now having an ulcer on the second toe of his foot.  Encouraged that he utilize the orthopedic shoes recommended by Dr.  Sharol Given and may have to forego wearing his stylish dress shoes. 8. As noted above under 7. 9. He has received the COVID-19 vaccine.  Still practicing social distancing.  Overall education and awareness concerning secondary risk prevention was discussed in detail: LDL  less than 70, hemoglobin A1c less than 7, blood pressure target less than 130/80 mmHg, >150 minutes of moderate aerobic activity per week, avoidance of smoking, weight control (via diet and exercise), and continued surveillance/management of/for obstructive sleep apnea.  Guideline directed therapy for left ventricular systolic dysfunction: Angiotensin receptor-neprilysin inhibitor (ARNI)-Entresto; beta-blocker therapy - carvedilol or metoprolol succinate; mineralocorticoid receptor antagonist (MRA) therapy -spironolactone or eplerenone.  These therapies have been shown to improve clinical outcomes including reduction of rehospitalization survival, and acute heart failure. SGLT-2 has been shown to decrease risk of HF events (Dapagloflozin and Empagliflozin).    Medication Adjustments/Labs and Tests Ordered: Current medicines are reviewed at length with the patient today.  Concerns regarding medicines are outlined above.  No orders of the defined types were placed in this encounter.  No orders of the defined types were placed in this encounter.   Patient Instructions  Medication Instructions:  Your physician recommends that you continue on your current medications as directed. Please refer to the Current Medication list given to you today.  *If you need a refill on your cardiac medications before your next appointment, please call your pharmacy*   Lab Work: None If you have labs (blood work) drawn today and your tests are completely normal, you will receive your results only by: Marland Kitchen MyChart Message (if you have MyChart) OR . A paper copy in the mail If you have any lab test that is abnormal or we need to change your treatment, we  will call you to review the results.   Testing/Procedures: None   Follow-Up: At [facility], you and your health needs are our priority.  As part of our continuing mission to provide you with exceptional heart care, we have created designated Provider Care Teams.  These Care Teams include your primary Cardiologist (physician) and Advanced Practice Providers (APPs -  Physician Assistants and Nurse Practitioners) who all work together to provide you with the care you need, when you need it.  We recommend signing up for the patient portal called "MyChart".  Sign up information is provided on this After Visit Summary.  MyChart is used to connect with patients for Virtual Visits (Telemedicine).  Patients are able to view lab/test results, encounter notes, upcoming appointments, etc.  Non-urgent messages can be sent to your provider as well.   To learn more about what you can do with MyChart, go to [email or web address].    Your next appointment:   6 month(s)  The format for your next appointment:   In Person  Provider:   You may see Sinclair Grooms, MD or one of the following Advanced Practice Providers on your designated Care Team:    Truitt Merle, NP  Cecilie Kicks, NP  Kathyrn Drown, NP    Other Instructions      Signed, Sinclair Grooms, MD  06/21/2019 10:21 AM    [place]

## 2019-06-20 NOTE — Progress Notes (Signed)
Office Visit Note   Patient: Robert Horn           Date of Birth: [birth year]           MRN: [id number] Visit Date: 06/20/2019              Requested by: [name], MD [address] [place],   [address] PCP: [name], MD  Chief Complaint  Patient presents with  . Right Foot - Follow-up    02/13/19 right foot 1st ray amputation       HPI: The patient is an 86 year old gentleman seen today status post first ray amputation, right foot back in number.  He is concerned today about an ulcer over the dorsum of his IP joint second toe on the right foot today.  States this is been ongoing for weeks to months.  Unfortunately he has had swelling and drainage for the last 10 days.  No foul odor.  He has not had any subjective symptoms.  Assessment & Plan: Visit Diagnoses:  1. Other hammer toe(s) (acquired), right foot   2. Ulcer of toe of right foot, limited to breakdown of skin ([place])     Plan: We will call in antibiotic prescription.  Concern for acute osteomyelitis of the second toe.  With swelling and erythema.  Discussed getting shoewear with a wide shoe box he is currently awaiting some extra-depth orthopedic shoes.  He will continue daily dose of cleansing.  Begin antibacterial ointment dressing changes watch closely.  He will follow-up in 2 weeks.  Follow-Up Instructions: Return in about 2 weeks (around 07/04/2019).   Ortho Exam  Patient is alert, oriented, no adenopathy, well-dressed, normal affect, normal respiratory effort. On examination of the right foot there is first ray amputation is well-healed unfortunately he does have some swelling and mild erythema of his forefoot and second and third toes.  Over the IP joint of the second toe there is a 3 mm diameter ulcer this is 1 mm deep.  There is no exposed bone.  There is 1 drop of clear drainage there is no odor  Imaging: No results found. No images are attached to the encounter.  Labs: Lab  Results  Component Value Date   HGBA1C 7.0 (H) 01/31/2019   HGBA1C 6.4 (H) 12/13/2017   HGBA1C 6.4 (H) 12/09/2016   REPTSTATUS 02/04/2019 FINAL 01/30/2019   CULT  01/30/2019    NO GROWTH 5 DAYS Performed at [facility], [place] [address] [place],  [address]      Lab Results  Component Value Date   ALBUMIN 2.3 (L) 02/06/2019   ALBUMIN 2.2 (L) 02/05/2019   ALBUMIN 2.2 (L) 02/04/2019    Lab Results  Component Value Date   MG 2.1 02/06/2019   MG 2.3 02/05/2019   MG 2.3 02/04/2019   No results found for: VD25OH  No results found for: PREALBUMIN CBC EXTENDED Latest Ref Rng & Units 02/10/2019 02/09/2019 02/08/2019  WBC 4.0 - 10.5 K/uL 6.0 6.6 7.4  RBC 4.22 - 5.81 MIL/uL 2.95(L) 2.92(L) 2.91(L)  HGB 13.0 - 17.0 g/dL 8.7(L) 8.7(L) 8.5(L)  HCT 39.0 - 52.0 % 27.6(L) 27.1(L) 26.9(L)  PLT 150 - 400 K/uL 336 325 325  NEUTROABS 1.7 - 7.7 K/uL - - -  LYMPHSABS 0.7 - 4.0 K/uL - - -     Body mass index is 31.06 kg/m.  Orders:  No orders of the defined types were placed in this encounter.  No orders of the defined types were placed in this encounter.    Procedures: No procedures performed  Clinical Data: No additional findings.  ROS:  All other systems negative, except as noted in the HPI. Review of Systems  Objective: Vital Signs: Ht 6' (1.829 m)   Wt 229 lb (103.9 kg)   BMI 31.06 kg/m   Specialty Comments:  No specialty comments available.  PMFS History: Patient Active Problem List   Diagnosis Date Noted  . Osteomyelitis of great toe of right foot ([place])   . Severe protein-calorie malnutrition ([place])   . Diabetic polyneuropathy associated with type 2 diabetes mellitus ([place])   . Cutaneous abscess of right foot   . Cellulitis of right foot 01/30/2019  . CKD (chronic kidney disease), stage III 01/30/2019  . Elevated LFTs 01/30/2019  . Colon cancer ([place]) 01/30/2019  . Anemia of chronic disease 01/30/2019  . Acute on chronic combined systolic and  diastolic CHF (congestive heart failure) ([place]) 03/09/2018  . AKI (acute kidney injury) ([place]) 03/09/2018  . Colonic mass 12/10/2017  . S/P TAVR (transcatheter aortic valve replacement) 03/03/2017  . Severe aortic stenosis 12/15/2016  . Essential hypertension 05/09/2013  . Hyperlipidemia 05/09/2013  . PVC's (premature ventricular contractions) 05/09/2013  . Chronic combined systolic and diastolic heart failure ([place]) 05/09/2013   Past Medical History:  Diagnosis Date  . CHF (congestive heart failure) ([place])   . Dyslipidemia 10/27/2015  . Dyspnea    w/ exertion   . Elevated PSA 10/27/2015  . Erectile dysfunction 10/27/2015  . Heart murmur   . Hypertension 10/27/2015  . Hypogonadism male 10/27/2015  . Obesity 10/27/2015  . Pneumonia 10/27/2015   pt states was 1982  . Rotator cuff tear 10/27/2015    Family History  Problem Relation Age of Onset  . Diabetes Mother   . Heart disease Mother   . Pulmonary embolism Father     Past Surgical History:  Procedure Laterality Date  . ABDOMINAL AORTOGRAM N/A 02/01/2019   Procedure: ABDOMINAL AORTOGRAM;  Surgeon: Marty Heck, MD;  Location: [facility];  Service: Cardiovascular;  Laterality: N/A;  . AMPUTATION Right 02/03/2019   Procedure: RIGHT FOOT 1ST RAY AMPUTATION;  Surgeon: [name], MD;  Location: [facility];  Service: Orthopedics;  Laterality: Right;  . CARDIAC CATHETERIZATION N/A 11/14/2015   Procedure: Left Heart Cath and Coronary Angiography;  Surgeon: [name], MD;  Location: [facility];  Service: Cardiovascular;  Laterality: N/A;  . LAPAROSCOPIC PARTIAL COLECTOMY  12/15/2017   LAPAROSCOPIC PARTIAL COLECTOMY (N/A Abdomen)  . LAPAROSCOPIC PARTIAL COLECTOMY N/A 12/15/2017   Procedure: LAPAROSCOPIC PARTIAL COLECTOMY;  Surgeon: Erroll Luna, MD;  Location: [facility];  Service: General;  Laterality: N/A;  . LOWER EXTREMITY ANGIOGRAPHY Right 02/01/2019   Procedure: LOWER EXTREMITY ANGIOGRAPHY;  Surgeon: Marty Heck, MD;  Location: [facility];  Service: Cardiovascular;  Laterality: Right;  . PERIPHERAL VASCULAR INTERVENTION Right 02/01/2019   Procedure: PERIPHERAL VASCULAR INTERVENTION;  Surgeon: Marty Heck, MD;  Location: [facility];  Service: Cardiovascular;  Laterality: Right;  SFA  . TEE WITHOUT CARDIOVERSION N/A 12/15/2016   Procedure: TRANSESOPHAGEAL ECHOCARDIOGRAM (TEE);  Surgeon: Sherren Mocha, MD;  Location: [facility];  Service: Open Heart Surgery;  Laterality: N/A;  . TRANSCATHETER AORTIC VALVE REPLACEMENT, TRANSFEMORAL N/A 12/15/2016   Procedure: TRANSCATHETER AORTIC VALVE REPLACEMENT, TRANSFEMORAL;  Surgeon: Sherren Mocha, MD;  Location: [facility];  Service: Open Heart Surgery;  Laterality: N/A;   Social  History   Occupational History  . Not on file  Tobacco Use  . Smoking status: Former Smoker    Types: Cigarettes  . Smokeless tobacco: Never Used  Substance and Sexual Activity  . Alcohol use: Yes    Comment: rarely  . Drug use: No  . Sexual activity: Not on file

## 2019-06-21 ENCOUNTER — Encounter: Payer: Self-pay | Admitting: Interventional Cardiology

## 2019-06-21 ENCOUNTER — Ambulatory Visit: Payer: Medicare HMO | Admitting: Interventional Cardiology

## 2019-06-21 VITALS — BP 122/72 | HR 70 | Ht 72.0 in | Wt 227.0 lb

## 2019-06-21 DIAGNOSIS — N182 Chronic kidney disease, stage 2 (mild): Secondary | ICD-10-CM | POA: Diagnosis not present

## 2019-06-21 DIAGNOSIS — E782 Mixed hyperlipidemia: Secondary | ICD-10-CM | POA: Diagnosis not present

## 2019-06-21 DIAGNOSIS — Z89411 Acquired absence of right great toe: Secondary | ICD-10-CM | POA: Diagnosis not present

## 2019-06-21 DIAGNOSIS — I10 Essential (primary) hypertension: Secondary | ICD-10-CM

## 2019-06-21 DIAGNOSIS — I5042 Chronic combined systolic (congestive) and diastolic (congestive) heart failure: Secondary | ICD-10-CM | POA: Diagnosis not present

## 2019-06-21 DIAGNOSIS — L97511 Non-pressure chronic ulcer of other part of right foot limited to breakdown of skin: Secondary | ICD-10-CM

## 2019-06-21 DIAGNOSIS — Z7189 Other specified counseling: Secondary | ICD-10-CM

## 2019-06-21 DIAGNOSIS — S98111A Complete traumatic amputation of right great toe, initial encounter: Secondary | ICD-10-CM

## 2019-06-21 DIAGNOSIS — Z952 Presence of prosthetic heart valve: Secondary | ICD-10-CM

## 2019-06-21 DIAGNOSIS — I739 Peripheral vascular disease, unspecified: Secondary | ICD-10-CM | POA: Diagnosis not present

## 2019-06-21 NOTE — Addendum Note (Signed)
Addended by: Carylon Perches on: 06/21/2019 04:00 PM   Modules accepted: Orders

## 2019-06-21 NOTE — Patient Instructions (Signed)

## 2019-07-04 ENCOUNTER — Ambulatory Visit (HOSPITAL_COMMUNITY)
Admission: RE | Admit: 2019-07-04 | Discharge: 2019-07-04 | Disposition: A | Discharge status: Home / Self Care | Payer: Medicare HMO | Source: Ambulatory Visit | Attending: Orthopedic Surgery | Admitting: Orthopedic Surgery

## 2019-07-04 ENCOUNTER — Ambulatory Visit: Payer: Medicare HMO | Admitting: Orthopedic Surgery

## 2019-07-04 ENCOUNTER — Other Ambulatory Visit: Payer: Self-pay

## 2019-07-04 ENCOUNTER — Telehealth: Payer: Self-pay

## 2019-07-04 ENCOUNTER — Ambulatory Visit: Payer: Self-pay

## 2019-07-04 ENCOUNTER — Encounter: Payer: Self-pay | Admitting: Orthopedic Surgery

## 2019-07-04 DIAGNOSIS — I739 Peripheral vascular disease, unspecified: Secondary | ICD-10-CM

## 2019-07-04 DIAGNOSIS — M86171 Other acute osteomyelitis, right ankle and foot: Secondary | ICD-10-CM | POA: Insufficient documentation

## 2019-07-04 DIAGNOSIS — L97511 Non-pressure chronic ulcer of other part of right foot limited to breakdown of skin: Secondary | ICD-10-CM

## 2019-07-04 MED ORDER — DOXYCYCLINE HYCLATE 100 MG PO TABS: 100.0000 mg | ORAL_TABLET | Freq: Two times a day (BID) | ORAL | 0 refills | Status: DC

## 2019-07-04 NOTE — Telephone Encounter (Signed)
error 

## 2019-07-04 NOTE — Progress Notes (Signed)
Office Visit Note   Patient: Robert Horn           Date of Birth: Mar 19, 1934           MRN: [id number] Visit Date: 07/04/2019              Requested by: [name], MD [address] [place] [address] [place],  [place] [address] PCP: [name], MD  No chief complaint on file.     HPI: Patient is an 86 year old gentleman with peripheral vascular disease who is status post first ray amputation on the right that had delayed healing.  Patient developed a ulcer of the skin and was started on antibiotics for clawing of the second toe patient is seen today in follow-up he states he has had increased swelling of the second toe denies any fever chills denies claudication symptoms denies any pain with elevation of his leg.  Assessment & Plan: Visit Diagnoses:  1. Ulcer of toe of right foot, limited to breakdown of skin ([place])   2. PVD (peripheral vascular disease) ([place])   3. Acute osteomyelitis of toe, right (H[facility])     Plan: By Doppler patient does have decreased circulation from the study in December to the right lower extremity.  Patient now has an ulcer that extends down to the PIP joint of the right second toe and will require a second toe amputation.  Will make a urgent referral to vascular vein surgery for arterial evaluation of the right lower extremity.  Will refill the Trental and doxycycline.  Follow-Up Instructions: No follow-ups on file.   Ortho Exam  Patient is alert, oriented, no adenopathy, well-dressed, normal affect, normal respiratory effort. Examination patient has no ulcers on the left lower extremity.  By Doppler he has a good biphasic dorsalis pedis and posterior tibial pulse without ulcers or ischemic changes.  Examination of the right foot patient has a dampened posterior tibial and dorsalis pedis pulse previous ankle-brachial indices showed a triphasic waveform.  Patient has a ulcer over the PIP joint of the second toe with exposed joint consistent with  osteomyelitis.  There is sausage digit swelling consistent with infection.  Patient is currently on Trental and doxycycline.  The previous first ray amputation incision is well-healed.  Imaging: XR Foot 2 Views Right  Result Date: 07/04/2019 2 view radiographs of the right foot shows no evidence of a stress fracture clawing of the second toe with an ulcer over the PIP joint without destructive bony changes  No images are attached to the encounter.  Labs: Lab Results  Component Value Date   HGBA1C 7.0 (H) 01/31/2019   HGBA1C 6.4 (H) 12/13/2017   HGBA1C 6.4 (H) 12/09/2016   REPTSTATUS 02/04/2019 FINAL 01/30/2019   CULT  01/30/2019    NO GROWTH 5 DAYS Performed at [facility], [place] [address] [place], [place] [address]      Lab Results  Component Value Date   ALBUMIN 2.3 (L) 02/06/2019   ALBUMIN 2.2 (L) 02/05/2019   ALBUMIN 2.2 (L) 02/04/2019    Lab Results  Component Value Date   MG 2.1 02/06/2019   MG 2.3 02/05/2019   MG 2.3 02/04/2019   No results found for: VD25OH  No results found for: PREALBUMIN CBC EXTENDED Latest Ref Rng & Units 02/10/2019 02/09/2019 02/08/2019  WBC 4.0 - 10.5 K/uL 6.0 6.6 7.4  RBC 4.22 - 5.81 MIL/uL 2.95(L) 2.92(L) 2.91(L)  HGB 13.0 - 17.0 g/dL 8.7(L) 8.7(L) 8.5(L)  HCT 39.0 - 52.0 %  27.6(L) 27.1(L) 26.9(L)  PLT 150 - 400 K/uL 336 325 325  NEUTROABS 1.7 - 7.7 K/uL - - -  LYMPHSABS 0.7 - 4.0 K/uL - - -     There is no height or weight on file to calculate BMI.  Orders:  Orders Placed This Encounter  Procedures  . XR Foot 2 Views Right  . Ambulatory referral to Vascular Surgery   No orders of the defined types were placed in this encounter.    Procedures: No procedures performed  Clinical Data: No additional findings.  ROS:  All other systems negative, except as noted in the HPI. Review of Systems  Objective: Vital Signs: There were no vitals taken for this visit.  Specialty Comments:  No specialty comments  available.  PMFS History: Patient Active Problem List   Diagnosis Date Noted  . Osteomyelitis of great toe of right foot ([place])   . Severe protein-calorie malnutrition ([place])   . Diabetic polyneuropathy associated with type 2 diabetes mellitus ([place])   . Cutaneous abscess of right foot   . Cellulitis of right foot 01/30/2019  . CKD (chronic kidney disease), stage III 01/30/2019  . Elevated LFTs 01/30/2019  . Colon cancer ([place]) 01/30/2019  . Anemia of chronic disease 01/30/2019  . Acute on chronic combined systolic and diastolic CHF (congestive heart failure) ([place]) 03/09/2018  . AKI (acute kidney injury) ([place]) 03/09/2018  . Colonic mass 12/10/2017  . S/P TAVR (transcatheter aortic valve replacement) 03/03/2017  . Severe aortic stenosis 12/15/2016  . Essential hypertension 05/09/2013  . Hyperlipidemia 05/09/2013  . PVC's (premature ventricular contractions) 05/09/2013  . Chronic combined systolic and diastolic heart failure ([place]) 05/09/2013   Past Medical History:  Diagnosis Date  . CHF (congestive heart failure) ([place])   . Dyslipidemia 10/27/2015  . Dyspnea    w/ exertion   . Elevated PSA 10/27/2015  . Erectile dysfunction 10/27/2015  . Heart murmur   . Hypertension 10/27/2015  . Hypogonadism male 10/27/2015  . Obesity 10/27/2015  . Pneumonia 10/27/2015   pt states was 1982  . Rotator cuff tear 10/27/2015    Family History  Problem Relation Age of Onset  . Diabetes Mother   . Heart disease Mother   . Pulmonary embolism Father     Past Surgical History:  Procedure Laterality Date  . ABDOMINAL AORTOGRAM N/A 02/01/2019   Procedure: ABDOMINAL AORTOGRAM;  Surgeon: Marty Heck, MD;  Location: [facility];  Service: Cardiovascular;  Laterality: N/A;  . AMPUTATION Right 02/03/2019   Procedure: RIGHT FOOT 1ST RAY AMPUTATION;  Surgeon: [name], MD;  Location: [facility];  Service: Orthopedics;  Laterality: Right;  . CARDIAC CATHETERIZATION N/A 11/14/2015    Procedure: Left Heart Cath and Coronary Angiography;  Surgeon: [name], MD;  Location: [facility];  Service: Cardiovascular;  Laterality: N/A;  . LAPAROSCOPIC PARTIAL COLECTOMY  12/15/2017   LAPAROSCOPIC PARTIAL COLECTOMY (N/A Abdomen)  . LAPAROSCOPIC PARTIAL COLECTOMY N/A 12/15/2017   Procedure: LAPAROSCOPIC PARTIAL COLECTOMY;  Surgeon: Erroll Luna, MD;  Location: [facility];  Service: General;  Laterality: N/A;  . LOWER EXTREMITY ANGIOGRAPHY Right 02/01/2019   Procedure: LOWER EXTREMITY ANGIOGRAPHY;  Surgeon: Marty Heck, MD;  Location: [facility];  Service: Cardiovascular;  Laterality: Right;  . PERIPHERAL VASCULAR INTERVENTION Right 02/01/2019   Procedure: PERIPHERAL VASCULAR INTERVENTION;  Surgeon: Marty Heck, MD;  Location: [facility];  Service: Cardiovascular;  Laterality: Right;  SFA  . TEE WITHOUT CARDIOVERSION N/A  12/15/2016   Procedure: TRANSESOPHAGEAL ECHOCARDIOGRAM (TEE);  Surgeon: Sherren Mocha, MD;  Location: [facility];  Service: Open Heart Surgery;  Laterality: N/A;  . TRANSCATHETER AORTIC VALVE REPLACEMENT, TRANSFEMORAL N/A 12/15/2016   Procedure: TRANSCATHETER AORTIC VALVE REPLACEMENT, TRANSFEMORAL;  Surgeon: Sherren Mocha, MD;  Location: [facility];  Service: Open Heart Surgery;  Laterality: N/A;   Social History   Occupational History  . Not on file  Tobacco Use  . Smoking status: Former Smoker    Types: Cigarettes  . Smokeless tobacco: Never Used  Substance and Sexual Activity  . Alcohol use: Yes    Comment: rarely  . Drug use: No  . Sexual activity: Not on file

## 2019-07-06 ENCOUNTER — Other Ambulatory Visit: Payer: Self-pay | Admitting: Interventional Cardiology

## 2019-07-06 NOTE — Telephone Encounter (Signed)
Ok to fill 

## 2019-07-06 NOTE — Telephone Encounter (Signed)
Pt's pharmacy is requesting a refill on clopidogrel. This medication was prescribed in the hospital. Would Dr. [name] like to refill this medication? Please address

## 2019-07-07 ENCOUNTER — Telehealth (HOSPITAL_COMMUNITY): Payer: Self-pay

## 2019-07-07 NOTE — Telephone Encounter (Signed)

## 2019-07-10 ENCOUNTER — Other Ambulatory Visit: Payer: Self-pay

## 2019-07-10 ENCOUNTER — Ambulatory Visit: Payer: Medicare HMO | Admitting: Orthopedic Surgery

## 2019-07-10 ENCOUNTER — Encounter: Payer: Self-pay | Admitting: Orthopedic Surgery

## 2019-07-10 DIAGNOSIS — M86171 Other acute osteomyelitis, right ankle and foot: Secondary | ICD-10-CM | POA: Diagnosis not present

## 2019-07-10 NOTE — Progress Notes (Signed)
Office Visit Note   Patient: Robert Horn           Date of Birth: 1934-01-14           MRN: [id number] Visit Date: 07/10/2019              Requested by: [name], MD [address] [place] [address] [place],  [place] [address] PCP: [name], MD  Chief Complaint  Patient presents with  . Right Foot - Follow-up      HPI: Patient is an 86 year old gentleman who is status post right foot first ray amputation.  Postoperatively patient did have slow healing however this is eventually healed uneventfully.  Patient developed a ulcer over the PIP joint of the second toe with progressive clawing of the toe and tight shoewear has caused an ulcer that extends down to the PIP joint.  Patient is most recently status post ankle-brachial indices and states he has an appointment with vascular vein surgery tomorrow to evaluate his circulation.  Patient requests consultation with vascular vein surgery.  Assessment & Plan: Visit Diagnoses:  1. Acute osteomyelitis of toe, right ([place])     Plan: With the exposed joint of the second toe and fixed clawing discussed the patient's best option is to proceed with an amputation of the second toe through the MTP joint as an outpatient surgery.  Patient will call us tomorrow after his vascular consultation.  Follow-Up Instructions: Return in about 2 weeks (around 07/24/2019).   Ortho Exam  Patient is alert, oriented, no adenopathy, well-dressed, normal affect, normal respiratory effort.  Examination patient has palpable pulses he has sausage digit swelling of the second toe with fixed clawing.  The PIP joint has a dorsal ulcer with the joint exposed and drainage from the PIP joint.  There is no ascending cellulitis.  Patient has been on doxycycline and Trental.  Ankle-brachial indices shows good circulation.   Imaging: No results found. No images are attached to the encounter.  Labs: Lab Results  Component Value Date   HGBA1C 7.0 (H) 01/31/2019   HGBA1C 6.4 (H) 12/13/2017   HGBA1C 6.4 (H) 12/09/2016   REPTSTATUS 02/04/2019 FINAL 01/30/2019   CULT  01/30/2019    NO GROWTH 5 DAYS Performed at [facility], [place] [address] [place], [place] [address]      Lab Results  Component Value Date   ALBUMIN 2.3 (L) 02/06/2019   ALBUMIN 2.2 (L) 02/05/2019   ALBUMIN 2.2 (L) 02/04/2019    Lab Results  Component Value Date   MG 2.1 02/06/2019   MG 2.3 02/05/2019   MG 2.3 02/04/2019   No results found for: VD25OH  No results found for: PREALBUMIN CBC EXTENDED Latest Ref Rng & Units 02/10/2019 02/09/2019 02/08/2019  WBC 4.0 - 10.5 K/uL 6.0 6.6 7.4  RBC 4.22 - 5.81 MIL/uL 2.95(L) 2.92(L) 2.91(L)  HGB 13.0 - 17.0 g/dL 8.7(L) 8.7(L) 8.5(L)  HCT 39.0 - 52.0 % 27.6(L) 27.1(L) 26.9(L)  PLT 150 - 400 K/uL 336 325 325  NEUTROABS 1.7 - 7.7 K/uL - - -  LYMPHSABS 0.7 - 4.0 K/uL - - -     There is no height or weight on file to calculate BMI.  Orders:  No orders of the defined types were placed in this encounter.  No orders of the defined types were placed in this encounter.    Procedures: No procedures performed  Clinical Data: No additional findings.  ROS:  All other systems negative, except as noted  in the HPI. Review of Systems  Objective: Vital Signs: There were no vitals taken for this visit.  Specialty Comments:  No specialty comments available.  PMFS History: Patient Active Problem List   Diagnosis Date Noted  . Osteomyelitis of great toe of right foot ([place])   . Severe protein-calorie malnutrition ([place])   . Diabetic polyneuropathy associated with type 2 diabetes mellitus ([place])   . Cutaneous abscess of right foot   . Cellulitis of right foot 01/30/2019  . CKD (chronic kidney disease), stage III 01/30/2019  . Elevated LFTs 01/30/2019  . Colon cancer ([place]) 01/30/2019  . Anemia of chronic disease 01/30/2019  . Acute on chronic combined systolic and diastolic CHF (congestive heart failure) ([place]) 03/09/2018   . AKI (acute kidney injury) ([place]) 03/09/2018  . Colonic mass 12/10/2017  . S/P TAVR (transcatheter aortic valve replacement) 03/03/2017  . Severe aortic stenosis 12/15/2016  . Essential hypertension 05/09/2013  . Hyperlipidemia 05/09/2013  . PVC's (premature ventricular contractions) 05/09/2013  . Chronic combined systolic and diastolic heart failure ([place]) 05/09/2013   Past Medical History:  Diagnosis Date  . CHF (congestive heart failure) ([place])   . Dyslipidemia 10/27/2015  . Dyspnea    w/ exertion   . Elevated PSA 10/27/2015  . Erectile dysfunction 10/27/2015  . Heart murmur   . Hypertension 10/27/2015  . Hypogonadism male 10/27/2015  . Obesity 10/27/2015  . Pneumonia 10/27/2015   pt states was 1982  . Rotator cuff tear 10/27/2015    Family History  Problem Relation Age of Onset  . Diabetes Mother   . Heart disease Mother   . Pulmonary embolism Father     Past Surgical History:  Procedure Laterality Date  . ABDOMINAL AORTOGRAM N/A 02/01/2019   Procedure: ABDOMINAL AORTOGRAM;  Surgeon: Marty Heck, MD;  Location: [facility];  Service: Cardiovascular;  Laterality: N/A;  . AMPUTATION Right 02/03/2019   Procedure: RIGHT FOOT 1ST RAY AMPUTATION;  Surgeon: [name], MD;  Location: [facility];  Service: Orthopedics;  Laterality: Right;  . CARDIAC CATHETERIZATION N/A 11/14/2015   Procedure: Left Heart Cath and Coronary Angiography;  Surgeon: [name], MD;  Location: [facility];  Service: Cardiovascular;  Laterality: N/A;  . LAPAROSCOPIC PARTIAL COLECTOMY  12/15/2017   LAPAROSCOPIC PARTIAL COLECTOMY (N/A Abdomen)  . LAPAROSCOPIC PARTIAL COLECTOMY N/A 12/15/2017   Procedure: LAPAROSCOPIC PARTIAL COLECTOMY;  Surgeon: Erroll Luna, MD;  Location: [facility];  Service: General;  Laterality: N/A;  . LOWER EXTREMITY ANGIOGRAPHY Right 02/01/2019   Procedure: LOWER EXTREMITY ANGIOGRAPHY;  Surgeon: Marty Heck, MD;  Location: [facility];  Service:  Cardiovascular;  Laterality: Right;  . PERIPHERAL VASCULAR INTERVENTION Right 02/01/2019   Procedure: PERIPHERAL VASCULAR INTERVENTION;  Surgeon: Marty Heck, MD;  Location: [facility];  Service: Cardiovascular;  Laterality: Right;  SFA  . TEE WITHOUT CARDIOVERSION N/A 12/15/2016   Procedure: TRANSESOPHAGEAL ECHOCARDIOGRAM (TEE);  Surgeon: Sherren Mocha, MD;  Location: [facility];  Service: Open Heart Surgery;  Laterality: N/A;  . TRANSCATHETER AORTIC VALVE REPLACEMENT, TRANSFEMORAL N/A 12/15/2016   Procedure: TRANSCATHETER AORTIC VALVE REPLACEMENT, TRANSFEMORAL;  Surgeon: Sherren Mocha, MD;  Location: [facility];  Service: Open Heart Surgery;  Laterality: N/A;   Social History   Occupational History  . Not on file  Tobacco Use  . Smoking status: Former Smoker    Types: Cigarettes  . Smokeless tobacco: Never Used  Substance and Sexual Activity  . Alcohol use: Yes  Comment: rarely  . Drug use: No  . Sexual activity: Not on file

## 2019-07-11 ENCOUNTER — Ambulatory Visit (INDEPENDENT_AMBULATORY_CARE_PROVIDER_SITE_OTHER): Payer: Medicare HMO | Admitting: Physician Assistant

## 2019-07-11 VITALS — BP 144/77 | HR 54 | Temp 98.1°F | Resp 16 | Ht 72.0 in | Wt 221.0 lb

## 2019-07-11 DIAGNOSIS — I739 Peripheral vascular disease, unspecified: Secondary | ICD-10-CM | POA: Diagnosis not present

## 2019-07-11 NOTE — Progress Notes (Signed)
History of Present Illness:  Patient is a 86 y.o. year old male who presents for evaluation of PAD with new wound on  over the PIP joint of the second toe on the right foot.  He has had a previous right foot first ray amputation that has healed s/p aortogram on 02/01/2019 with right SFA angioplasty with stent placement by Dr. Carlis Abbott.    He denise fever and chills.  No drainage from the wound.  He has ordered orthopedic shoes with a high toe box to protect his toes.     He had ABI's ordered by Dr. Sharol Given 07/04/2019 which reported biphasic flow with calcified vessels.    He is on Aspirin, Plavix and Crestor daily.      Past Medical History:  Diagnosis Date  . CHF (congestive heart failure) ([place])   . Dyslipidemia 10/27/2015  . Dyspnea    w/ exertion   . Elevated PSA 10/27/2015  . Erectile dysfunction 10/27/2015  . Heart murmur   . Hypertension 10/27/2015  . Hypogonadism male 10/27/2015  . Obesity 10/27/2015  . Pneumonia 10/27/2015   pt states was 1982  . Rotator cuff tear 10/27/2015    Past Surgical History:  Procedure Laterality Date  . ABDOMINAL AORTOGRAM N/A 02/01/2019   Procedure: ABDOMINAL AORTOGRAM;  Surgeon: Marty Heck, MD;  Location: [facility];  Service: Cardiovascular;  Laterality: N/A;  . AMPUTATION Right 02/03/2019   Procedure: RIGHT FOOT 1ST RAY AMPUTATION;  Surgeon: [name], MD;  Location: [facility];  Service: Orthopedics;  Laterality: Right;  . CARDIAC CATHETERIZATION N/A 11/14/2015   Procedure: Left Heart Cath and Coronary Angiography;  Surgeon: [name], MD;  Location: [facility];  Service: Cardiovascular;  Laterality: N/A;  . LAPAROSCOPIC PARTIAL COLECTOMY  12/15/2017   LAPAROSCOPIC PARTIAL COLECTOMY (N/A Abdomen)  . LAPAROSCOPIC PARTIAL COLECTOMY N/A 12/15/2017   Procedure: LAPAROSCOPIC PARTIAL COLECTOMY;  Surgeon: Erroll Luna, MD;  Location: [facility];  Service: General;  Laterality: N/A;  . LOWER EXTREMITY ANGIOGRAPHY Right  02/01/2019   Procedure: LOWER EXTREMITY ANGIOGRAPHY;  Surgeon: Marty Heck, MD;  Location: [facility];  Service: Cardiovascular;  Laterality: Right;  . PERIPHERAL VASCULAR INTERVENTION Right 02/01/2019   Procedure: PERIPHERAL VASCULAR INTERVENTION;  Surgeon: Marty Heck, MD;  Location: [facility];  Service: Cardiovascular;  Laterality: Right;  SFA  . TEE WITHOUT CARDIOVERSION N/A 12/15/2016   Procedure: TRANSESOPHAGEAL ECHOCARDIOGRAM (TEE);  Surgeon: Sherren Mocha, MD;  Location: [facility];  Service: Open Heart Surgery;  Laterality: N/A;  . TRANSCATHETER AORTIC VALVE REPLACEMENT, TRANSFEMORAL N/A 12/15/2016   Procedure: TRANSCATHETER AORTIC VALVE REPLACEMENT, TRANSFEMORAL;  Surgeon: Sherren Mocha, MD;  Location: [facility];  Service: Open Heart Surgery;  Laterality: N/A;    ROS:   General:  No weight loss, Fever, chills  HEENT: No recent headaches, no nasal bleeding, no visual changes, no sore throat  Neurologic: No dizziness, blackouts, seizures. No recent symptoms of stroke or mini- stroke. No recent episodes of slurred speech, or temporary blindness.  Cardiac: No recent episodes of chest pain/pressure, no shortness of breath at rest.  No shortness of breath with exertion.  Denies history of atrial fibrillation or irregular heartbeat  Vascular: No history of rest pain in feet.  No history of claudication.  No history of non-healing ulcer, No history of DVT   Pulmonary: No home oxygen, no productive cough, no hemoptysis,  No asthma or wheezing  Musculoskeletal:  [ ]  Arthritis, [ ]   Low back pain,  [ ]  Joint pain  Hematologic:No history of hypercoagulable state.  No history of easy bleeding.  No history of anemia  Gastrointestinal: No hematochezia or melena,  No gastroesophageal reflux, no trouble swallowing  Urinary: [ ]  chronic Kidney disease, [ ]  on HD - [ ]  MWF or [ ]  TTHS, [ ]  Burning with urination, [ ]  Frequent urination, [ ]  Difficulty urinating;    Skin: No rashes  Psychological: No history of anxiety,  No history of depression  Social History Social History   Tobacco Use  . Smoking status: Former Smoker    Types: Cigarettes  . Smokeless tobacco: Never Used  Substance Use Topics  . Alcohol use: Yes    Comment: rarely  . Drug use: No    Family History Family History  Problem Relation Age of Onset  . Diabetes Mother   . Heart disease Mother   . Pulmonary embolism Father     Allergies  No Known Allergies   Current Outpatient Medications  Medication Sig Dispense Refill  . amLODipine (NORVASC) 10 MG tablet TAKE 1 TABLET BY MOUTH EVERY DAY 90 tablet 3  . aspirin (ASPIRIN EC) 81 MG EC tablet Take 81 mg by mouth daily. Swallow whole.    . carvedilol (COREG) 3.125 MG tablet TAKE 1 TABLET (3.125 MG TOTAL) BY MOUTH 2 (TWO) TIMES DAILY. 180 tablet 3  . clopidogrel (PLAVIX) 75 MG tablet TAKE 1 TABLET (75 MG TOTAL) BY MOUTH DAILY WITH BREAKFAST. 30 tablet 3  . doxycycline (VIBRA-TABS) 100 MG tablet Take 1 tablet (100 mg total) by mouth 2 (two) times daily. 28 tablet 0  . ferrous sulfate 325 (65 FE) MG tablet Take 325 mg by mouth daily with breakfast.    . furosemide (LASIX) 40 MG tablet TAKE 1.5 TABLETS BY MOUTH DAILY. 135 tablet 1  . glimepiride (AMARYL) 1 MG tablet Take 1 mg by mouth every morning.    . Multiple Vitamin (MULTIVITAMIN WITH MINERALS) TABS tablet Take 1 tablet by mouth daily. 30 tablet 1  . omega-3 acid ethyl esters (LOVAZA) 1 g capsule TAKE 1 CAPSULE BY MOUTH EVERY DAY 90 capsule 3  . pentoxifylline (TRENTAL) 400 MG CR tablet TAKE 1 TABLET (400 MG TOTAL) BY MOUTH 3 (THREE) TIMES DAILY WITH MEALS. 270 tablet 1  . rosuvastatin (CRESTOR) 10 MG tablet Take 10 mg by mouth daily.    . sacubitril-valsartan (ENTRESTO) 49-51 MG Take 1 tablet by mouth 2 (two) times daily. 180 tablet 3  . VITAMIN B COMPLEX-C PO Take 1 tablet by mouth daily.     No current facility-administered medications for this visit.     Physical Examination  Vitals:   07/11/19 0823  BP: (!) 144/77  Pulse: (!) 54  Resp: 16  Temp: 98.1 F (36.7 C)  SpO2: 98%  Weight: 221 lb (100.2 kg)  Height: 6' (1.829 m)    Body mass index is 29.97 kg/m.  General:  Alert and oriented, no acute distress HEENT: Normal Neck: No bruit or JVD Pulmonary: Clear to auscultation bilaterally Cardiac: Regular Rate and Rhythm without murmur Abdomen: Soft, non-tender, non-distended, no mass, no scars Skin: No rash Extremity Pulses:  2+ radial, brachial, femoral, right posterior tibial pulse is palpable.  Doppler signals left PT/DP/Peroneal.   Musculoskeletal: Right foot with hammer toes and edema in 2-5 digits  Neurologic: Upper and lower extremity motor 5/5 and symmetric  DATA:  ABI Findings:  +---------+------------------+-----+--------+---------+  Right  Rt Pressure (mmHg)IndexWaveformComment   +---------+------------------+-----+--------+---------+  Brachial 132                      +---------+------------------+-----+--------+---------+  PTA   165        1.25 biphasic       +---------+------------------+-----+--------+---------+  DP    156        1.18 biphasic       +---------+------------------+-----+--------+---------+  Great Toe                amputated  +---------+------------------+-----+--------+---------+   +---------+------------------+-----+----------+-------+  Left   Lt Pressure (mmHg)IndexWaveform Comment  +---------+------------------+-----+----------+-------+  Brachial 125                      +---------+------------------+-----+----------+-------+  PTA   99        0.75 monophasic      +---------+------------------+-----+----------+-------+  DP    92        0.70 monophasic      +---------+------------------+-----+----------+-------+  Great  Toe82        0.62 Abnormal       +---------+------------------+-----+----------+-------+   +-------+-----------+-----------+------------+------------+  ABI/TBIToday's ABIToday's TBIPrevious ABIPrevious TBI  +-------+-----------+-----------+------------+------------+  Right 1.25    amputated 1.16    amputated    +-------+-----------+-----------+------------+------------+  Left  0.75    0.62    0.70    0.53      +-------+-----------+-----------+------------+------------+       Summary:  Right: Resting right ankle-brachial index is within normal range. No  evidence of significant right lower extremity arterial disease.   Left: Resting left ankle-brachial index indicates moderate left lower  extremity arterial disease. The left toe-brachial index is abnormal.   Foot x ray 07/04/2019 2 view radiographs of the right foot shows no evidence of a stress  fracture clawing of the second toe with an ulcer over the PIP joint  without destructive bony changes   ASSESSMENT:  PAD new second ulcer right   s/p He underwent aortogram on 02/01/2019 with right SFA angioplasty with stent placement by Dr. Carlis Abbott, which was done for non healing wound on the right foot. He is s/p right great toe/1st metatarsal amputation by Dr. Sharol Given on 11/06/202.      PLAN:  He has a palpable pulse with patent stentand Biphasic flow on ABI's.  The ulcer appears superficial.  He will protect it with a dry dressing and improved shoe wear.    He has sufficent blood flow to heal this ulcer as long as it does not get infected. He is on Doxycycline prescribed by Dr. Sharol Given and has a f/u appt at the end of the month.  F/U 8-9 months with right LE arterial duplex and ABI's.   Roxy Horseman PA-C [facility] Office: [phone]

## 2019-07-12 ENCOUNTER — Encounter: Payer: Self-pay | Admitting: Orthopedic Surgery

## 2019-07-12 ENCOUNTER — Other Ambulatory Visit: Payer: Self-pay | Admitting: Interventional Cardiology

## 2019-07-12 ENCOUNTER — Other Ambulatory Visit: Payer: Self-pay | Admitting: *Deleted

## 2019-07-12 DIAGNOSIS — I739 Peripheral vascular disease, unspecified: Secondary | ICD-10-CM

## 2019-07-13 ENCOUNTER — Other Ambulatory Visit: Payer: Self-pay

## 2019-07-13 ENCOUNTER — Encounter: Payer: Self-pay | Admitting: Physician Assistant

## 2019-07-13 ENCOUNTER — Ambulatory Visit: Payer: Medicare HMO | Admitting: Physician Assistant

## 2019-07-13 DIAGNOSIS — L97511 Non-pressure chronic ulcer of other part of right foot limited to breakdown of skin: Secondary | ICD-10-CM

## 2019-07-13 MED ORDER — DOXYCYCLINE HYCLATE 100 MG PO TABS: 100.0000 mg | ORAL_TABLET | Freq: Two times a day (BID) | ORAL | 0 refills | Status: DC

## 2019-07-13 NOTE — Progress Notes (Signed)
Office Visit Note   Patient: Robert Horn           Date of Birth: 25-Mar-1934           MRN: [id number] Visit Date: 07/13/2019              Requested by: [name], MD [address] [place],  [place] [address] PCP: [name], MD  Chief Complaint  Patient presents with  . Right Foot - Pain, Follow-up      HPI: Patient presents for follow-up on his right second toe.  We have been following him for an ulcer over the PIP joint but did have some exposed bone.  He is currently on doxycycline.  He did visit with our vascular surgeons as well as his cardiologist.  His ABI study showed good flow to the right foot.  He also discussed things with his cardiologist who is supportive of him seeing if this could heal  Assessment & Plan: Visit Diagnoses: No diagnosis found.  Plan: Patient was seen today by Dr. Sharol Given had a long discussion with him regarding the viability of the toe.  The patient would like to just watch this for a short period of time to see if he sees improvement.  He understands that the best option may be a second toe amputation.  He will contact us if he wishes to move forward.  We have provided him with a refill of his doxycycline.  Follow-Up Instructions: No follow-ups on file.   Ortho Exam  Patient is alert, oriented, no adenopathy, well-dressed, normal affect, normal respiratory effort. Right second toe does have some fibrous tissue covering the PIP joint.  Still moderate swelling.  There is no ascending cellulitis or fluctuance.  There is no foul odor.  There is no ischemic changes.  Imaging: No results found. No images are attached to the encounter.  Labs: Lab Results  Component Value Date   HGBA1C 7.0 (H) 01/31/2019   HGBA1C 6.4 (H) 12/13/2017   HGBA1C 6.4 (H) 12/09/2016   REPTSTATUS 02/04/2019 FINAL 01/30/2019   CULT  01/30/2019    NO GROWTH 5 DAYS Performed at [facility], [place] [address], [place], [place] [address]       Lab Results  Component Value Date   ALBUMIN 2.3 (L) 02/06/2019   ALBUMIN 2.2 (L) 02/05/2019   ALBUMIN 2.2 (L) 02/04/2019    Lab Results  Component Value Date   MG 2.1 02/06/2019   MG 2.3 02/05/2019   MG 2.3 02/04/2019   No results found for: VD25OH  No results found for: PREALBUMIN CBC EXTENDED Latest Ref Rng & Units 02/10/2019 02/09/2019 02/08/2019  WBC 4.0 - 10.5 K/uL 6.0 6.6 7.4  RBC 4.22 - 5.81 MIL/uL 2.95(L) 2.92(L) 2.91(L)  HGB 13.0 - 17.0 g/dL 8.7(L) 8.7(L) 8.5(L)  HCT 39.0 - 52.0 % 27.6(L) 27.1(L) 26.9(L)  PLT 150 - 400 K/uL 336 325 325  NEUTROABS 1.7 - 7.7 K/uL - - -  LYMPHSABS 0.7 - 4.0 K/uL - - -     There is no height or weight on file to calculate BMI.  Orders:  No orders of the defined types were placed in this encounter.  Meds ordered this encounter  Medications  . doxycycline (VIBRA-TABS) 100 MG tablet    Sig: Take 1 tablet (100 mg total) by mouth 2 (two) times daily.    Dispense:  50 tablet    Refill:  0     Procedures: No procedures  performed  Clinical Data: No additional findings.  ROS:  All other systems negative, except as noted in the HPI. Review of Systems  Objective: Vital Signs: There were no vitals taken for this visit.  Specialty Comments:  No specialty comments available.  PMFS History: Patient Active Problem List   Diagnosis Date Noted  . Osteomyelitis of great toe of right foot ([place])   . Severe protein-calorie malnutrition ([place])   . Diabetic polyneuropathy associated with type 2 diabetes mellitus ([place])   . Cutaneous abscess of right foot   . Cellulitis of right foot 01/30/2019  . CKD (chronic kidney disease), stage III 01/30/2019  . Elevated LFTs 01/30/2019  . Colon cancer ([place]) 01/30/2019  . Anemia of chronic disease 01/30/2019  . Acute on chronic combined systolic and diastolic CHF (congestive heart failure) ([place]) 03/09/2018  . AKI (acute kidney injury) ([place]) 03/09/2018  . Colonic mass 12/10/2017  . S/P TAVR  (transcatheter aortic valve replacement) 03/03/2017  . Severe aortic stenosis 12/15/2016  . Essential hypertension 05/09/2013  . Hyperlipidemia 05/09/2013  . PVC's (premature ventricular contractions) 05/09/2013  . Chronic combined systolic and diastolic heart failure ([place]) 05/09/2013   Past Medical History:  Diagnosis Date  . CHF (congestive heart failure) ([place])   . Dyslipidemia 10/27/2015  . Dyspnea    w/ exertion   . Elevated PSA 10/27/2015  . Erectile dysfunction 10/27/2015  . Heart murmur   . Hypertension 10/27/2015  . Hypogonadism male 10/27/2015  . Obesity 10/27/2015  . Pneumonia 10/27/2015   pt states was 1982  . Rotator cuff tear 10/27/2015    Family History  Problem Relation Age of Onset  . Diabetes Mother   . Heart disease Mother   . Pulmonary embolism Father     Past Surgical History:  Procedure Laterality Date  . ABDOMINAL AORTOGRAM N/A 02/01/2019   Procedure: ABDOMINAL AORTOGRAM;  Surgeon: Marty Heck, MD;  Location: [facility];  Service: Cardiovascular;  Laterality: N/A;  . AMPUTATION Right 02/03/2019   Procedure: RIGHT FOOT 1ST RAY AMPUTATION;  Surgeon: [name], MD;  Location: [facility];  Service: Orthopedics;  Laterality: Right;  . CARDIAC CATHETERIZATION N/A 11/14/2015   Procedure: Left Heart Cath and Coronary Angiography;  Surgeon: [name], MD;  Location: [facility];  Service: Cardiovascular;  Laterality: N/A;  . LAPAROSCOPIC PARTIAL COLECTOMY  12/15/2017   LAPAROSCOPIC PARTIAL COLECTOMY (N/A Abdomen)  . LAPAROSCOPIC PARTIAL COLECTOMY N/A 12/15/2017   Procedure: LAPAROSCOPIC PARTIAL COLECTOMY;  Surgeon: Erroll Luna, MD;  Location: [facility];  Service: General;  Laterality: N/A;  . LOWER EXTREMITY ANGIOGRAPHY Right 02/01/2019   Procedure: LOWER EXTREMITY ANGIOGRAPHY;  Surgeon: Marty Heck, MD;  Location: [facility];  Service: Cardiovascular;  Laterality: Right;  . PERIPHERAL VASCULAR INTERVENTION Right  02/01/2019   Procedure: PERIPHERAL VASCULAR INTERVENTION;  Surgeon: Marty Heck, MD;  Location: [facility];  Service: Cardiovascular;  Laterality: Right;  SFA  . TEE WITHOUT CARDIOVERSION N/A 12/15/2016   Procedure: TRANSESOPHAGEAL ECHOCARDIOGRAM (TEE);  Surgeon: Sherren Mocha, MD;  Location: [facility];  Service: Open Heart Surgery;  Laterality: N/A;  . TRANSCATHETER AORTIC VALVE REPLACEMENT, TRANSFEMORAL N/A 12/15/2016   Procedure: TRANSCATHETER AORTIC VALVE REPLACEMENT, TRANSFEMORAL;  Surgeon: Sherren Mocha, MD;  Location: [facility];  Service: Open Heart Surgery;  Laterality: N/A;   Social History   Occupational History  . Not on file  Tobacco Use  . Smoking status: Former Smoker    Types: Cigarettes  .  Smokeless tobacco: Never Used  Substance and Sexual Activity  . Alcohol use: Yes    Comment: rarely  . Drug use: No  . Sexual activity: Not on file

## 2019-07-24 ENCOUNTER — Encounter: Payer: Self-pay | Admitting: Orthopedic Surgery

## 2019-07-27 ENCOUNTER — Encounter: Payer: Self-pay | Admitting: Orthopedic Surgery

## 2019-07-27 ENCOUNTER — Other Ambulatory Visit: Payer: Self-pay

## 2019-07-27 ENCOUNTER — Ambulatory Visit: Payer: Medicare HMO | Admitting: Orthopedic Surgery

## 2019-07-27 VITALS — Ht 72.0 in | Wt 221.0 lb

## 2019-07-27 DIAGNOSIS — M86171 Other acute osteomyelitis, right ankle and foot: Secondary | ICD-10-CM

## 2019-07-31 ENCOUNTER — Encounter: Payer: Self-pay | Admitting: Orthopedic Surgery

## 2019-07-31 NOTE — Progress Notes (Signed)
Office Visit Note   Patient: Robert Horn           Date of Birth: 01/06/1934           MRN: [id number] Visit Date: 07/27/2019              Requested by: [name], MD [address] [place],  [place] [address] PCP: [name], MD  Chief Complaint  Patient presents with  . Right Foot - Follow-up    2nd toe ulcer       HPI: Patient is an 86 year old gentleman who presents in follow-up for ulcer with exposed bone at the PIP joint right foot second toe status post first ray amputation.  Assessment & Plan: Visit Diagnoses:  1. Acute osteomyelitis of toe, right (HCC)     Plan: Reviewed options including surgery and nonsurgical options recommendation to proceed with amputation of the second toe.  Patient states he understands would like to proceed with surgery as soon as possible.  Follow-Up Instructions: Return in about 1 week (around 08/03/2019).   Ortho Exam  Patient is alert, oriented, no adenopathy, well-dressed, normal affect, normal respiratory effort. Examination patient has a good pulse the first ray amputation is healed well there is no redness no cellulitis no swelling in the foot.  Semination of the second toe there is an ulcer with exposed PIP joint of the second toe patient has undergone conservative therapy including antibiotic ointment and oral antibiotics without resolution.  Imaging: No results found. No images are attached to the encounter.  Labs: Lab Results  Component Value Date   HGBA1C 7.0 (H) 01/31/2019   HGBA1C 6.4 (H) 12/13/2017   HGBA1C 6.4 (H) 12/09/2016   REPTSTATUS 02/04/2019 FINAL 01/30/2019   CULT  01/30/2019    NO GROWTH 5 DAYS Performed at [facility], [place] [address] [place], [place] [address]      Lab Results  Component Value Date   ALBUMIN 2.3 (L) 02/06/2019   ALBUMIN 2.2 (L) 02/05/2019   ALBUMIN 2.2 (L) 02/04/2019    Lab Results  Component Value Date   MG 2.1 02/06/2019   MG 2.3 02/05/2019   MG 2.3 02/04/2019   No results found for: VD25OH  No results found for: PREALBUMIN CBC EXTENDED Latest Ref Rng & Units 02/10/2019 02/09/2019 02/08/2019  WBC 4.0 - 10.5 K/uL 6.0 6.6 7.4  RBC 4.22 - 5.81 MIL/uL 2.95(L) 2.92(L) 2.91(L)  HGB 13.0 - 17.0 g/dL 8.7(L) 8.7(L) 8.5(L)  HCT 39.0 - 52.0 % 27.6(L) 27.1(L) 26.9(L)  PLT 150 - 400 K/uL 336 325 325  NEUTROABS 1.7 - 7.7 K/uL - - -  LYMPHSABS 0.7 - 4.0 K/uL - - -     Body mass index is 29.97 kg/m.  Orders:  No orders of the defined types were placed in this encounter.  No orders of the defined types were placed in this encounter.    Procedures: No procedures performed  Clinical Data: No additional findings.  ROS:  All other systems negative, except as noted in the HPI. Review of Systems  Objective: Vital Signs: Ht 6' (1.829 m)   Wt 221 lb (100.2 kg)   BMI 29.97 kg/m   Specialty Comments:  No specialty comments available.  PMFS History: Patient Active Problem List   Diagnosis Date Noted  . Osteomyelitis of great toe of right foot ([place])   . Severe protein-calorie malnutrition ([place])   . Diabetic polyneuropathy associated with type 2 diabetes mellitus ([place])   .  Cutaneous abscess of right foot   . Cellulitis of right foot 01/30/2019  . CKD (chronic kidney disease), stage III 01/30/2019  . Elevated LFTs 01/30/2019  . Colon cancer ([place]) 01/30/2019  . Anemia of chronic disease 01/30/2019  . Acute on chronic combined systolic and diastolic CHF (congestive heart failure) ([place]) 03/09/2018  . AKI (acute kidney injury) ([place]) 03/09/2018  . Colonic mass 12/10/2017  . S/P TAVR (transcatheter aortic valve replacement) 03/03/2017  . Severe aortic stenosis 12/15/2016  . Essential hypertension 05/09/2013  . Hyperlipidemia 05/09/2013  . PVC's (premature ventricular contractions) 05/09/2013  . Chronic combined systolic and diastolic heart failure ([place]) 05/09/2013   Past Medical History:  Diagnosis Date  . CHF (congestive  heart failure) ([place])   . Dyslipidemia 10/27/2015  . Dyspnea    w/ exertion   . Elevated PSA 10/27/2015  . Erectile dysfunction 10/27/2015  . Heart murmur   . Hypertension 10/27/2015  . Hypogonadism male 10/27/2015  . Obesity 10/27/2015  . Pneumonia 10/27/2015   pt states was 1982  . Rotator cuff tear 10/27/2015    Family History  Problem Relation Age of Onset  . Diabetes Mother   . Heart disease Mother   . Pulmonary embolism Father     Past Surgical History:  Procedure Laterality Date  . ABDOMINAL AORTOGRAM N/A 02/01/2019   Procedure: ABDOMINAL AORTOGRAM;  Surgeon: Marty Heck, MD;  Location: [facility];  Service: Cardiovascular;  Laterality: N/A;  . AMPUTATION Right 02/03/2019   Procedure: RIGHT FOOT 1ST RAY AMPUTATION;  Surgeon: [name], MD;  Location: [facility];  Service: Orthopedics;  Laterality: Right;  . CARDIAC CATHETERIZATION N/A 11/14/2015   Procedure: Left Heart Cath and Coronary Angiography;  Surgeon: [name], MD;  Location: [facility];  Service: Cardiovascular;  Laterality: N/A;  . LAPAROSCOPIC PARTIAL COLECTOMY  12/15/2017   LAPAROSCOPIC PARTIAL COLECTOMY (N/A Abdomen)  . LAPAROSCOPIC PARTIAL COLECTOMY N/A 12/15/2017   Procedure: LAPAROSCOPIC PARTIAL COLECTOMY;  Surgeon: Erroll Luna, MD;  Location: [facility];  Service: General;  Laterality: N/A;  . LOWER EXTREMITY ANGIOGRAPHY Right 02/01/2019   Procedure: LOWER EXTREMITY ANGIOGRAPHY;  Surgeon: Marty Heck, MD;  Location: [facility];  Service: Cardiovascular;  Laterality: Right;  . PERIPHERAL VASCULAR INTERVENTION Right 02/01/2019   Procedure: PERIPHERAL VASCULAR INTERVENTION;  Surgeon: Marty Heck, MD;  Location: [facility];  Service: Cardiovascular;  Laterality: Right;  SFA  . TEE WITHOUT CARDIOVERSION N/A 12/15/2016   Procedure: TRANSESOPHAGEAL ECHOCARDIOGRAM (TEE);  Surgeon: Sherren Mocha, MD;  Location: [facility];  Service: Open Heart Surgery;   Laterality: N/A;  . TRANSCATHETER AORTIC VALVE REPLACEMENT, TRANSFEMORAL N/A 12/15/2016   Procedure: TRANSCATHETER AORTIC VALVE REPLACEMENT, TRANSFEMORAL;  Surgeon: Sherren Mocha, MD;  Location: [facility];  Service: Open Heart Surgery;  Laterality: N/A;   Social History   Occupational History  . Not on file  Tobacco Use  . Smoking status: Former Smoker    Types: Cigarettes  . Smokeless tobacco: Never Used  Substance and Sexual Activity  . Alcohol use: Yes    Comment: rarely  . Drug use: No  . Sexual activity: Not on file

## 2019-08-01 ENCOUNTER — Other Ambulatory Visit: Payer: Self-pay

## 2019-08-01 ENCOUNTER — Other Ambulatory Visit (HOSPITAL_COMMUNITY)
Admission: RE | Admit: 2019-08-01 | Discharge: 2019-08-01 | Disposition: A | Discharge status: Home / Self Care | Payer: Medicare HMO | Source: Ambulatory Visit | Attending: Orthopedic Surgery | Admitting: Orthopedic Surgery

## 2019-08-01 ENCOUNTER — Encounter (HOSPITAL_COMMUNITY): Payer: Self-pay | Admitting: Orthopedic Surgery

## 2019-08-01 ENCOUNTER — Other Ambulatory Visit: Payer: Self-pay | Admitting: Physician Assistant

## 2019-08-01 DIAGNOSIS — Z01812 Encounter for preprocedural laboratory examination: Secondary | ICD-10-CM | POA: Insufficient documentation

## 2019-08-01 DIAGNOSIS — Z20822 Contact with and (suspected) exposure to covid-19: Secondary | ICD-10-CM | POA: Diagnosis not present

## 2019-08-01 LAB — SARS CORONAVIRUS 2 (TAT 6-24 HRS): SARS Coronavirus 2: NEGATIVE

## 2019-08-01 NOTE — Progress Notes (Addendum)
Mr. Masser denies chest pain or shortness of breath, per wife. Mr. Warne has Mrs. Nathanyl Karagiannis speak to health care personnel  Patient tested negative for Covid today and has been in quarantine since that time.  Mr. Torain has type II diabetes, last A1C was 7.0.  Patient does not check CBGs.

## 2019-08-02 ENCOUNTER — Ambulatory Visit (HOSPITAL_COMMUNITY): Payer: Medicare HMO | Admitting: Anesthesiology

## 2019-08-02 ENCOUNTER — Encounter (HOSPITAL_COMMUNITY): Payer: Self-pay | Admitting: Orthopedic Surgery

## 2019-08-02 ENCOUNTER — Encounter (HOSPITAL_COMMUNITY)
Admission: RE | Disposition: A | Discharge status: Home / Self Care | Payer: Self-pay | Source: Home / Self Care | Attending: Orthopedic Surgery

## 2019-08-02 ENCOUNTER — Ambulatory Visit (HOSPITAL_COMMUNITY)
Admission: RE | Admit: 2019-08-02 | Discharge: 2019-08-02 | Disposition: A | Discharge status: Home / Self Care | Payer: Medicare HMO | Attending: Orthopedic Surgery | Admitting: Orthopedic Surgery

## 2019-08-02 ENCOUNTER — Ambulatory Visit (INDEPENDENT_AMBULATORY_CARE_PROVIDER_SITE_OTHER): Payer: Medicare HMO | Admitting: Physician Assistant

## 2019-08-02 ENCOUNTER — Encounter: Payer: Self-pay | Admitting: Family

## 2019-08-02 ENCOUNTER — Other Ambulatory Visit: Payer: Self-pay

## 2019-08-02 VITALS — Ht 72.0 in | Wt 222.0 lb

## 2019-08-02 DIAGNOSIS — I110 Hypertensive heart disease with heart failure: Secondary | ICD-10-CM | POA: Diagnosis not present

## 2019-08-02 DIAGNOSIS — M869 Osteomyelitis, unspecified: Secondary | ICD-10-CM

## 2019-08-02 DIAGNOSIS — Z952 Presence of prosthetic heart valve: Secondary | ICD-10-CM | POA: Insufficient documentation

## 2019-08-02 DIAGNOSIS — Z87891 Personal history of nicotine dependence: Secondary | ICD-10-CM | POA: Diagnosis not present

## 2019-08-02 DIAGNOSIS — Z20822 Contact with and (suspected) exposure to covid-19: Secondary | ICD-10-CM | POA: Insufficient documentation

## 2019-08-02 DIAGNOSIS — Z9049 Acquired absence of other specified parts of digestive tract: Secondary | ICD-10-CM | POA: Diagnosis not present

## 2019-08-02 DIAGNOSIS — E785 Hyperlipidemia, unspecified: Secondary | ICD-10-CM | POA: Diagnosis not present

## 2019-08-02 DIAGNOSIS — E1169 Type 2 diabetes mellitus with other specified complication: Secondary | ICD-10-CM | POA: Diagnosis not present

## 2019-08-02 DIAGNOSIS — Z6830 Body mass index (BMI) 30.0-30.9, adult: Secondary | ICD-10-CM | POA: Insufficient documentation

## 2019-08-02 DIAGNOSIS — E1151 Type 2 diabetes mellitus with diabetic peripheral angiopathy without gangrene: Secondary | ICD-10-CM | POA: Insufficient documentation

## 2019-08-02 DIAGNOSIS — I509 Heart failure, unspecified: Secondary | ICD-10-CM | POA: Diagnosis not present

## 2019-08-02 DIAGNOSIS — I350 Nonrheumatic aortic (valve) stenosis: Secondary | ICD-10-CM | POA: Diagnosis not present

## 2019-08-02 HISTORY — PX: AMPUTATION: SHX166

## 2019-08-02 HISTORY — DX: Peripheral vascular disease, unspecified: I73.9

## 2019-08-02 HISTORY — DX: Type 2 diabetes mellitus without complications: E11.9

## 2019-08-02 HISTORY — DX: Anemia, unspecified: D64.9

## 2019-08-02 LAB — BASIC METABOLIC PANEL
Anion gap: 8 (ref 5–15)
BUN: 24 mg/dL — ABNORMAL HIGH (ref 8–23)
CO2: 21 mmol/L — ABNORMAL LOW (ref 22–32)
Calcium: 9 mg/dL (ref 8.9–10.3)
Chloride: 109 mmol/L (ref 98–111)
Creatinine, Ser: 1.51 mg/dL — ABNORMAL HIGH (ref 0.61–1.24)
GFR calc Af Amer: 49 mL/min — ABNORMAL LOW (ref >60)
GFR calc non Af Amer: 42 mL/min — ABNORMAL LOW (ref >60)
Glucose, Bld: 101 mg/dL — ABNORMAL HIGH (ref 70–99)
Potassium: 4.3 mmol/L (ref 3.5–5.1)
Sodium: 138 mmol/L (ref 135–145)

## 2019-08-02 LAB — CBC
HCT: 38.5 % — ABNORMAL LOW (ref 39.0–52.0)
Hemoglobin: 12.1 g/dL — ABNORMAL LOW (ref 13.0–17.0)
MCH: 28.9 pg (ref 26.0–34.0)
MCHC: 31.4 g/dL (ref 30.0–36.0)
MCV: 91.9 fL (ref 80.0–100.0)
Platelets: 158 10*3/uL (ref 150–400)
RBC: 4.19 MIL/uL — ABNORMAL LOW (ref 4.22–5.81)
RDW: 14.4 % (ref 11.5–15.5)
WBC: 4.9 10*3/uL (ref 4.0–10.5)
nRBC: 0 % (ref 0.0–0.2)

## 2019-08-02 LAB — GLUCOSE, CAPILLARY
Glucose-Capillary: 113 mg/dL — ABNORMAL HIGH (ref 70–99)
Glucose-Capillary: 105 mg/dL — ABNORMAL HIGH (ref 70–99)

## 2019-08-02 SURGERY — AMPUTATION DIGIT
Anesthesia: General | Site: Foot | Laterality: Right

## 2019-08-02 MED ORDER — FENTANYL CITRATE (PF) 250 MCG/5ML IJ SOLN
INTRAMUSCULAR | Status: AC
  Filled 2019-08-02: qty 5

## 2019-08-02 MED ORDER — LACTATED RINGERS IV SOLN: INTRAVENOUS | Status: DC

## 2019-08-02 MED ORDER — CEFAZOLIN SODIUM-DEXTROSE 2-4 GM/100ML-% IV SOLN
2.0000 g | INTRAVENOUS | Status: AC
  Administered 2019-08-02: 2 g via INTRAVENOUS
  Filled 2019-08-02: qty 100

## 2019-08-02 MED ORDER — ONDANSETRON HCL 4 MG/2ML IJ SOLN
INTRAMUSCULAR | Status: DC | PRN
  Administered 2019-08-02: 4 mg via INTRAVENOUS

## 2019-08-02 MED ORDER — FENTANYL CITRATE (PF) 100 MCG/2ML IJ SOLN: 25.0000 ug | INTRAMUSCULAR | Status: DC | PRN

## 2019-08-02 MED ORDER — ACETAMINOPHEN 325 MG PO TABS: 325.0000 mg | ORAL_TABLET | ORAL | Status: DC | PRN

## 2019-08-02 MED ORDER — BUPIVACAINE HCL (PF) 0.25 % IJ SOLN
INTRAMUSCULAR | Status: AC
  Filled 2019-08-02: qty 30

## 2019-08-02 MED ORDER — OXYCODONE HCL 5 MG/5ML PO SOLN: 5.0000 mg | Freq: Once | ORAL | Status: DC | PRN

## 2019-08-02 MED ORDER — PROPOFOL 10 MG/ML IV BOLUS
INTRAVENOUS | Status: AC
  Filled 2019-08-02: qty 20

## 2019-08-02 MED ORDER — ONDANSETRON HCL 4 MG/2ML IJ SOLN: 4.0000 mg | Freq: Once | INTRAMUSCULAR | Status: DC | PRN

## 2019-08-02 MED ORDER — BUPIVACAINE HCL (PF) 0.25 % IJ SOLN
INTRAMUSCULAR | Status: DC | PRN
  Administered 2019-08-02: 10 mL

## 2019-08-02 MED ORDER — 0.9 % SODIUM CHLORIDE (POUR BTL) OPTIME
TOPICAL | Status: DC | PRN
  Administered 2019-08-02: 1000 mL

## 2019-08-02 MED ORDER — OXYCODONE-ACETAMINOPHEN 5-325 MG PO TABS: 1.0000 | ORAL_TABLET | ORAL | 0 refills | Status: AC | PRN

## 2019-08-02 MED ORDER — OXYCODONE HCL 5 MG PO TABS: 5.0000 mg | ORAL_TABLET | Freq: Once | ORAL | Status: DC | PRN

## 2019-08-02 MED ORDER — MEPERIDINE HCL 25 MG/ML IJ SOLN: 6.2500 mg | INTRAMUSCULAR | Status: DC | PRN

## 2019-08-02 MED ORDER — PROPOFOL 500 MG/50ML IV EMUL
INTRAVENOUS | Status: DC | PRN
  Administered 2019-08-02: 100 ug/kg/min via INTRAVENOUS

## 2019-08-02 MED ORDER — LIDOCAINE 2% (20 MG/ML) 5 ML SYRINGE
INTRAMUSCULAR | Status: DC | PRN
  Administered 2019-08-02: 60 mg via INTRAVENOUS

## 2019-08-02 MED ORDER — ACETAMINOPHEN 160 MG/5ML PO SOLN: 325.0000 mg | ORAL | Status: DC | PRN

## 2019-08-02 MED ORDER — FENTANYL CITRATE (PF) 100 MCG/2ML IJ SOLN
INTRAMUSCULAR | Status: DC | PRN
  Administered 2019-08-02: 50 ug via INTRAVENOUS

## 2019-08-02 SURGICAL SUPPLY — 29 items
BLADE SURG 21 STRL SS (BLADE) ×2 IMPLANT
BNDG CMPR 9X4 STRL LF SNTH (GAUZE/BANDAGES/DRESSINGS)
BNDG COHESIVE 4X5 TAN STRL (GAUZE/BANDAGES/DRESSINGS) ×2 IMPLANT
BNDG ESMARK 4X9 LF (GAUZE/BANDAGES/DRESSINGS) IMPLANT
BNDG GAUZE ELAST 4 BULKY (GAUZE/BANDAGES/DRESSINGS) ×1 IMPLANT
COVER SURGICAL LIGHT HANDLE (MISCELLANEOUS) ×3 IMPLANT
DRAPE U-SHAPE 47X51 STRL (DRAPES) ×2 IMPLANT
DRSG ADAPTIC 3X8 NADH LF (GAUZE/BANDAGES/DRESSINGS) ×1 IMPLANT
DRSG PAD ABDOMINAL 8X10 ST (GAUZE/BANDAGES/DRESSINGS) ×1 IMPLANT
DURAPREP 26ML APPLICATOR (WOUND CARE) ×2 IMPLANT
ELECT REM PT RETURN 9FT ADLT (ELECTROSURGICAL) ×2
ELECTRODE REM PT RTRN 9FT ADLT (ELECTROSURGICAL) ×1 IMPLANT
GAUZE SPONGE 4X4 12PLY STRL (GAUZE/BANDAGES/DRESSINGS) ×1 IMPLANT
GLOVE BIOGEL PI IND STRL 9 (GLOVE) ×1 IMPLANT
GLOVE BIOGEL PI INDICATOR 9 (GLOVE) ×1
GLOVE SURG ORTHO 9.0 STRL STRW (GLOVE) ×2 IMPLANT
GOWN STRL REUS W/ TWL XL LVL3 (GOWN DISPOSABLE) ×2 IMPLANT
GOWN STRL REUS W/TWL XL LVL3 (GOWN DISPOSABLE) ×4
KIT BASIN OR (CUSTOM PROCEDURE TRAY) ×2 IMPLANT
KIT TURNOVER KIT B (KITS) ×2 IMPLANT
MANIFOLD NEPTUNE II (INSTRUMENTS) ×2 IMPLANT
NDL HYPO 25GX1X1/2 BEV (NEEDLE) IMPLANT
NEEDLE 22X1 1/2 (OR ONLY) (NEEDLE) IMPLANT
NEEDLE HYPO 25GX1X1/2 BEV (NEEDLE) ×2 IMPLANT
NS IRRIG 1000ML POUR BTL (IV SOLUTION) ×2 IMPLANT
PACK ORTHO EXTREMITY (CUSTOM PROCEDURE TRAY) ×2 IMPLANT
SUT ETHILON 2 0 PSLX (SUTURE) ×1 IMPLANT
SYR CONTROL 10ML LL (SYRINGE) ×1 IMPLANT
TOWEL GREEN STERILE (TOWEL DISPOSABLE) ×2 IMPLANT

## 2019-08-02 NOTE — H&P (Signed)
Claus Duree is an 86 y.o. male.   Chief Comlaint: Right second Toe Osteomyelitis HPI: Patient is an 86 year old gentleman who presents in follow-up for ulcer with exposed bone at the PIP joint right foot second toe status post first ray amputation.  Past Medical History:  Diagnosis Date  . Anemia   . Cancer [facility]) 2019   colon- colectomy   . CHF (congestive heart failure) ([place])   . Diabetes mellitus without complication ([place])    Type II  . Dyslipidemia 10/27/2015  . Dyspnea    w/ exertion   . Elevated PSA 10/27/2015  . Erectile dysfunction 10/27/2015  . Heart murmur   . Hypertension 10/27/2015  . Hypogonadism male 10/27/2015  . Obesity 10/27/2015  . Peripheral vascular disease ([place])   . Pneumonia 10/27/2015   pt states was 1982  . Rotator cuff tear 10/27/2015    Past Surgical History:  Procedure Laterality Date  . ABDOMINAL AORTOGRAM N/A 02/01/2019   Procedure: ABDOMINAL AORTOGRAM;  Surgeon: Marty Heck, MD;  Location: [facility];  Service: Cardiovascular;  Laterality: N/A;  . AMPUTATION Right 02/03/2019   Procedure: RIGHT FOOT 1ST RAY AMPUTATION;  Surgeon: [name], MD;  Location: [facility];  Service: Orthopedics;  Laterality: Right;  . CARDIAC CATHETERIZATION N/A 11/14/2015   Procedure: Left Heart Cath and Coronary Angiography;  Surgeon: [name], MD;  Location: [facility];  Service: Cardiovascular;  Laterality: N/A;  . COLONOSCOPY    . LAPAROSCOPIC PARTIAL COLECTOMY  12/15/2017   LAPAROSCOPIC PARTIAL COLECTOMY (N/A Abdomen)  . LAPAROSCOPIC PARTIAL COLECTOMY N/A 12/15/2017   Procedure: LAPAROSCOPIC PARTIAL COLECTOMY;  Surgeon: Erroll Luna, MD;  Location: [facility];  Service: General;  Laterality: N/A;  . LOWER EXTREMITY ANGIOGRAPHY Right 02/01/2019   Procedure: LOWER EXTREMITY ANGIOGRAPHY;  Surgeon: Marty Heck, MD;  Location: [facility];  Service: Cardiovascular;  Laterality: Right;  . PERIPHERAL VASCULAR INTERVENTION Right  02/01/2019   Procedure: PERIPHERAL VASCULAR INTERVENTION;  Surgeon: Marty Heck, MD;  Location: [facility];  Service: Cardiovascular;  Laterality: Right;  SFA  . TEE WITHOUT CARDIOVERSION N/A 12/15/2016   Procedure: TRANSESOPHAGEAL ECHOCARDIOGRAM (TEE);  Surgeon: Sherren Mocha, MD;  Location: [facility];  Service: Open Heart Surgery;  Laterality: N/A;  . TRANSCATHETER AORTIC VALVE REPLACEMENT, TRANSFEMORAL N/A 12/15/2016   Procedure: TRANSCATHETER AORTIC VALVE REPLACEMENT, TRANSFEMORAL;  Surgeon: Sherren Mocha, MD;  Location: [facility];  Service: Open Heart Surgery;  Laterality: N/A;    Family History  Problem Relation Age of Onset  . Diabetes Mother   . Heart disease Mother   . Pulmonary embolism Father    Social History:  reports that he has quit smoking. His smoking use included cigarettes. He has never used smokeless tobacco. He reports current alcohol use. He reports that he does not use drugs.  Allergies: No Known Allergies  No medications prior to admission.    Results for orders placed or performed during the hospital encounter of 08/01/19 (from the past 48 hour(s))  SARS CORONAVIRUS 2 (TAT 6-24 HRS) Nasopharyngeal Nasopharyngeal Swab     Status: None   Collection Time: 08/01/19  9:49 AM   Specimen: Nasopharyngeal Swab  Result Value Ref Range   SARS Coronavirus 2 NEGATIVE NEGATIVE    Comment: (NOTE) SARS-CoV-2 target nucleic acids are NOT DETECTED. The SARS-CoV-2 RNA is generally detectable in upper and lower respiratory specimens during the acute phase of infection. Negative results do not preclude SARS-CoV-2 infection, do not rule  out co-infections with other pathogens, and should not be used as the sole basis for treatment or other patient management decisions. Negative results must be combined with clinical observations, patient history, and epidemiological information. The expected result is Negative. Fact Sheet for  Patients: SugarRoll.be Fact Sheet for Healthcare Providers: https://www.woods-mathews.com/ This test is not yet approved or cleared by the [place] FDA and  has been authorized for detection and/or diagnosis of SARS-CoV-2 by FDA under an Emergency Use Authorization (EUA). This EUA will remain  in effect (meaning this test can be used) for the duration of the COVID-19 declaration under Section 56 4(b)(1) of the Act, 21 U.S.C. section 360bbb-3(b)(1), unless the authorization is terminated or revoked sooner. Performed at [facility], [place] [address] [place], [place] [address]    No results found.  Review of Systems  All other systems reviewed and are negative.   There were no vitals taken for this visit. Physical Exam  Patient is alert, oriented, no adenopathy, well-dressed, normal affect, normal respiratory effort. Examination patient has a good pulse the first ray amputation is healed well there is no redness no cellulitis no swelling in the foot.  Semination of the second toe there is an ulcer with exposed PIP joint of the second toe patient has undergone conservative therapy including antibiotic ointment and oral antibiotics without resolution. Heart RRR Lungs Clear Assessment/Plan Plan: Reviewed options including surgery and nonsurgical options recommendation to proceed with amputation of the second toe.  Patient states he understands would like to proceed with surgery as soon as possible.  Bevely Palmer Beautifull Cisar, PA 08/02/2019, 6:45 AM

## 2019-08-02 NOTE — Discharge Instructions (Signed)
Monitored Anesthesia Care Anesthesia is a term that refers to techniques, procedures, and medicines that help a person stay safe and comfortable during a medical procedure. Monitored anesthesia care, or sedation, is one type of anesthesia. Your anesthesia specialist may recommend sedation if you will be having a procedure that does not require you to be unconscious, such as:  Cataract surgery.  A dental procedure.  A biopsy.  A colonoscopy. During the procedure, you may receive a medicine to help you relax (sedative). There are three levels of sedation:  Mild sedation. At this level, you may feel awake and relaxed. You will be able to follow directions.  Moderate sedation. At this level, you will be sleepy. You may not remember the procedure.  Deep sedation. At this level, you will be asleep. You will not remember the procedure. The more medicine you are given, the deeper your level of sedation will be. Depending on how you respond to the procedure, the anesthesia specialist may change your level of sedation or the type of anesthesia to fit your needs. An anesthesia specialist will monitor you closely during the procedure. Let your health care provider know about:  Any allergies you have.  All medicines you are taking, including vitamins, herbs, eye drops, creams, and over-the-counter medicines.  Any use of steroids (by mouth or as a cream).  Any problems you or family members have had with sedatives and anesthetic medicines.  Any blood disorders you have.  Any surgeries you have had.  Any medical conditions you have, such as sleep apnea.  Whether you are pregnant or may be pregnant.  Any use of cigarettes, alcohol, or street drugs. What are the risks? Generally, this is a safe procedure. However, problems may occur, including:  Getting too much medicine (oversedation).  Nausea.  Allergic reaction to medicines.  Trouble breathing. If this happens, a breathing tube may be  used to help with breathing. It will be removed when you are awake and breathing on your own.  Heart trouble.  Lung trouble. Before the procedure Staying hydrated Follow instructions from your health care provider about hydration, which may include:  Up to 2 hours before the procedure - you may continue to drink clear liquids, such as water, clear fruit juice, black coffee, and plain tea. Eating and drinking restrictions Follow instructions from your health care provider about eating and drinking, which may include:  8 hours before the procedure - stop eating heavy meals or foods such as meat, fried foods, or fatty foods.  6 hours before the procedure - stop eating light meals or foods, such as toast or cereal.  6 hours before the procedure - stop drinking milk or drinks that contain milk.  2 hours before the procedure - stop drinking clear liquids. Medicines Ask your health care provider about:  Changing or stopping your regular medicines. This is especially important if you are taking diabetes medicines or blood thinners.  Taking medicines such as aspirin and ibuprofen. These medicines can thin your blood. Do not take these medicines before your procedure if your health care provider instructs you not to. Tests and exams  You will have a physical exam.  You may have blood tests done to show: ? How well your kidneys and liver are working. ? How well your blood can clot. General instructions  Plan to have someone take you home from the hospital or clinic.  If you will be going home right after the procedure, plan to have someone with you  for 24 hours.  What happens during the procedure?  Your blood pressure, heart rate, breathing, level of pain and overall condition will be monitored.  An IV tube will be inserted into one of your veins.  Your anesthesia specialist will give you medicines as needed to keep you comfortable during the procedure. This may mean changing the  level of sedation.  The procedure will be performed. After the procedure  Your blood pressure, heart rate, breathing rate, and blood oxygen level will be monitored until the medicines you were given have worn off.  Do not drive for 24 hours if you received a sedative.  You may: ? Feel sleepy, clumsy, or nauseous. ? Feel forgetful about what happened after the procedure. ? Have a sore throat if you had a breathing tube during the procedure. ? Vomit. This information is not intended to replace advice given to you by your health care provider. Make sure you discuss any questions you have with your health care provider. Document Revised: 02/26/2017 Document Reviewed: 07/07/2015 Elsevier Patient Education  2020 [employer]  

## 2019-08-02 NOTE — Op Note (Signed)
08/02/2019  11:41 AM  PATIENT:  Robert Horn    PRE-OPERATIVE DIAGNOSIS:  Osteomyelitis Right 2nd Toe  POST-OPERATIVE DIAGNOSIS:  Same  PROCEDURE:  RIGHT 2ND TOE AMPUTATION  SURGEON:  [name], MD  PHYSICIAN ASSISTANT:None ANESTHESIA:   General  PREOPERATIVE INDICATIONS:  Robert Horn is a  86 y.o. male with a diagnosis of Osteomyelitis Right 2nd Toe who failed conservative measures and elected for surgical management.    The risks benefits and alternatives were discussed with the patient preoperatively including but not limited to the risks of infection, bleeding, nerve injury, cardiopulmonary complications, the need for revision surgery, among others, and the patient was willing to proceed.  OPERATIVE IMPLANTS: None  _0 @  OPERATIVE FINDINGS: Petechial bleeding of the amputation site no abscess or infection at the MTP joint  OPERATIVE PROCEDURE: Patient was brought the operating room underwent a MAC anesthetic.  The right lower extremity was then prepped using DuraPrep draped into a sterile field a timeout was called.  Patient underwent local anesthesia with 10 cc of quarter percent Marcaine plain.  A fishmouth incision was made just distal to the MTP joint.  The second toe was amputated through the MTP joint.  Electrocautery was used for hemostasis wound was irrigated with normal saline incision was closed using 2-0 nylon a sterile dressing was applied patient was taken the PACU in stable condition.   DISCHARGE PLANNING:  Antibiotic duration: Preoperative antibiotics  Weightbearing: Touchdown weightbearing on the right  Pain medication: Prescription for Percocet  Dressing care/ Wound VAC: Follow-up in the office in 1 week to change the dressing  Ambulatory devices: Walker or crutches  Discharge to: Home.  Follow-up: In the office 1 week post operative.

## 2019-08-02 NOTE — Anesthesia Preprocedure Evaluation (Signed)
Anesthesia Evaluation  Patient identified by MRN, date of birth, ID band Patient awake    Reviewed: Allergy & Precautions, H&P , NPO status , Patient's Chart, lab work & pertinent test results, reviewed documented beta blocker date and time   Airway Mallampati: I  TM Distance: >3 FB Neck ROM: Full    Dental no notable dental hx.    Pulmonary neg pulmonary ROS, former smoker,    Pulmonary exam normal breath sounds clear to auscultation       Cardiovascular Exercise Tolerance: Good hypertension, Pt. on medications + Peripheral Vascular Disease and +CHF  Normal cardiovascular exam Rhythm:regular Rate:Normal     Neuro/Psych negative neurological ROS  negative psych ROS   GI/Hepatic negative GI ROS, Neg liver ROS,   Endo/Other  diabetes, Type 2Morbid obesity  Renal/GU Renal disease  negative genitourinary   Musculoskeletal   Abdominal   Peds  Hematology  (+) Blood dyscrasia, anemia ,   Anesthesia Other Findings   Reproductive/Obstetrics negative OB ROS                             Anesthesia Physical  Anesthesia Plan  ASA: III  Anesthesia Plan: General   Post-op Pain Management:    Induction: Intravenous  PONV Risk Score and Plan: 1 and Ondansetron and Treatment may vary due to age or medical condition  Airway Management Planned: LMA  Additional Equipment:   Intra-op Plan:   Post-operative Plan: Extubation in OR  Informed Consent: I have reviewed the patients History and Physical, chart, labs and discussed the procedure including the risks, benefits and alternatives for the proposed anesthesia with the patient or authorized representative who has indicated his/her understanding and acceptance.     Dental Advisory Given  Plan Discussed with: CRNA, Surgeon and Anesthesiologist  Anesthesia Plan Comments: ( )        Anesthesia Quick Evaluation

## 2019-08-02 NOTE — Transfer of Care (Signed)
Immediate Anesthesia Transfer of Care Note  Patient: Robert Horn  Procedure(s) Performed: RIGHT 2ND TOE AMPUTATION (Right Foot)  Patient Location:    Anesthesia Type:MAC  Level of Consciousness: awake, alert  and oriented  Airway & Oxygen Therapy: Patient Spontanous Breathing and Patient connected to nasal cannula oxygen  Post-op Assessment: Report given to RN and Post -op Vital signs reviewed and stable  Post vital signs: Reviewed and stable  Last Vitals:  Vitals Value Taken Time  BP 107/58 08/02/19 1148  Temp    Pulse 44 08/02/19 1149  Resp 15 08/02/19 1149  SpO2 99 % 08/02/19 1149  Vitals shown include unvalidated device data.  Last Pain:  Vitals:   08/02/19 1033  TempSrc:   PainSc: 0-No pain      Patients Stated Pain Goal: 0 (03/00/92 3300)  Complications: No apparent anesthesia complications

## 2019-08-02 NOTE — Anesthesia Postprocedure Evaluation (Signed)
Anesthesia Post Note  Patient: Robert Horn  Procedure(s) Performed: RIGHT 2ND TOE AMPUTATION (Right Foot)     Patient location during evaluation: PACU Anesthesia Type: General Level of consciousness: awake and alert Pain management: pain level controlled Vital Signs Assessment: post-procedure vital signs reviewed and stable Respiratory status: spontaneous breathing, nonlabored ventilation, respiratory function stable and patient connected to nasal cannula oxygen Cardiovascular status: blood pressure returned to baseline and stable Postop Assessment: no apparent nausea or vomiting Anesthetic complications: no    Last Vitals:  Vitals:   08/02/19 1215 08/02/19 1230  BP: 119/66 131/63  Pulse: (!) 42 (!) 32  Resp: 10 11  Temp:  36.5 C  SpO2: 98% 100%    Last Pain:  Vitals:   08/02/19 1215  TempSrc:   PainSc: 0-No pain                 [name]

## 2019-08-02 NOTE — Progress Notes (Signed)
Office Visit Note   Patient: Robert Horn           Date of Birth: 08/14/1933           MRN: [id number] Visit Date: 08/02/2019              Requested by: [name], MD [address] [place] [address] [place],  [place] [address] PCP: [name], MD  Chief Complaint  Patient presents with  . Right Foot - Routine Post Op    08/02/19 second toe amputation       HPI: This is a pleasant gentleman who underwent a right second toe amputation today.  When he went home he had some bleeding through the dressing so he presents today for a dressing change Assessment & Plan: Visit Diagnoses: No diagnosis found.  Plan: A bulky compressive dressing was applied.  We discussed with him the importance of elevating his leg and taking it easy today.  We did just give him some extra dressings in case it bleeds through overnight.  If this happens he can call us in the morning and we will change the dressing again  Follow-Up Instructions: No follow-ups on file.   Ortho Exam  Patient is alert, oriented, no adenopathy, well-dressed, normal affect, normal respiratory focused examination of his right surgical wound well apposed wound edges no dehiscence mild soft tissue swelling there is some bleeding especially on the more midline area of the wound.  Imaging: No results found. No images are attached to the encounter.  Labs: Lab Results  Component Value Date   HGBA1C 7.0 (H) 01/31/2019   HGBA1C 6.4 (H) 12/13/2017   HGBA1C 6.4 (H) 12/09/2016   REPTSTATUS 02/04/2019 FINAL 01/30/2019   CULT  01/30/2019    NO GROWTH 5 DAYS Performed at [facility], [place] [address] [name], [place] [address]      Lab Results  Component Value Date   ALBUMIN 2.3 (L) 02/06/2019   ALBUMIN 2.2 (L) 02/05/2019   ALBUMIN 2.2 (L) 02/04/2019    Lab Results  Component Value Date   MG 2.1 02/06/2019   MG 2.3 02/05/2019   MG 2.3 02/04/2019   No results found for: VD25OH  No results found for:  PREALBUMIN CBC EXTENDED Latest Ref Rng & Units 08/02/2019 02/10/2019 02/09/2019  WBC 4.0 - 10.5 K/uL 4.9 6.0 6.6  RBC 4.22 - 5.81 MIL/uL 4.19(L) 2.95(L) 2.92(L)  HGB 13.0 - 17.0 g/dL 12.1(L) 8.7(L) 8.7(L)  HCT 39.0 - 52.0 % 38.5(L) 27.6(L) 27.1(L)  PLT 150 - 400 K/uL 158 336 325  NEUTROABS 1.7 - 7.7 K/uL - - -  LYMPHSABS 0.7 - 4.0 K/uL - - -     Body mass index is 30.11 kg/m.  Orders:  No orders of the defined types were placed in this encounter.  No orders of the defined types were placed in this encounter.    Procedures: No procedures performed  Clinical Data: No additional findings.  ROS:  All other systems negative, except as noted in the HPI. Review of Systems  Objective: Vital Signs: Ht 6' (1.829 m)   Wt 222 lb (100.7 kg)   BMI 30.11 kg/m   Specialty Comments:  No specialty comments available.  PMFS History: Patient Active Problem List   Diagnosis Date Noted  . Osteomyelitis of second toe of right foot ([place])   . Osteomyelitis of great toe of right foot ([place])   . Severe protein-calorie malnutrition ([place])   . Diabetic polyneuropathy associated with  type 2 diabetes mellitus ([place])   . Cutaneous abscess of right foot   . Cellulitis of right foot 01/30/2019  . CKD (chronic kidney disease), stage III 01/30/2019  . Elevated LFTs 01/30/2019  . Colon cancer ([place]) 01/30/2019  . Anemia of chronic disease 01/30/2019  . Acute on chronic combined systolic and diastolic CHF (congestive heart failure) ([employer] [place]) 03/09/2018  . AKI (acute kidney injury) ([place]) 03/09/2018  . Colonic mass 12/10/2017  . S/P TAVR (transcatheter aortic valve replacement) 03/03/2017  . Severe aortic stenosis 12/15/2016  . Essential hypertension 05/09/2013  . Hyperlipidemia 05/09/2013  . PVC's (premature ventricular contractions) 05/09/2013  . Chronic combined systolic and diastolic heart failure ([place]) 05/09/2013   Past Medical History:  Diagnosis Date  . Anemia   . Cancer [facility]) 2019   colon-  colectomy   . CHF (congestive heart failure) ([place])   . Diabetes mellitus without complication ([place])    Type II  . Dyslipidemia 10/27/2015  . Dyspnea    w/ exertion   . Elevated PSA 10/27/2015  . Erectile dysfunction 10/27/2015  . Heart murmur   . Hypertension 10/27/2015  . Hypogonadism male 10/27/2015  . Obesity 10/27/2015  . Peripheral vascular disease ([place])   . Pneumonia 10/27/2015   pt states was 1982  . Rotator cuff tear 10/27/2015    Family History  Problem Relation Age of Onset  . Diabetes Mother   . Heart disease Mother   . Pulmonary embolism Father     Past Surgical History:  Procedure Laterality Date  . ABDOMINAL AORTOGRAM N/A 02/01/2019   Procedure: ABDOMINAL AORTOGRAM;  Surgeon: Marty Heck, MD;  Location: [place] [facility];  Service: Cardiovascular;  Laterality: N/A;  . AMPUTATION Right 02/03/2019   Procedure: RIGHT FOOT 1ST RAY AMPUTATION;  Surgeon: [name], MD;  Location: [place];  Service: Orthopedics;  Laterality: Right;  . CARDIAC CATHETERIZATION N/A 11/14/2015   Procedure: Left Heart Cath and Coronary Angiography;  Surgeon: [name], MD;  Location: [place] [facility];  Service: Cardiovascular;  Laterality: N/A;  . COLONOSCOPY    . LAPAROSCOPIC PARTIAL COLECTOMY  12/15/2017   LAPAROSCOPIC PARTIAL COLECTOMY (N/A Abdomen)  . LAPAROSCOPIC PARTIAL COLECTOMY N/A 12/15/2017   Procedure: LAPAROSCOPIC PARTIAL COLECTOMY;  Surgeon: Erroll Luna, MD;  Location: [place];  Service: General;  Laterality: N/A;  . LOWER EXTREMITY ANGIOGRAPHY Right 02/01/2019   Procedure: LOWER EXTREMITY ANGIOGRAPHY;  Surgeon: Marty Heck, MD;  Location: [place] [facility];  Service: Cardiovascular;  Laterality: Right;  . PERIPHERAL VASCULAR INTERVENTION Right 02/01/2019   Procedure: PERIPHERAL VASCULAR INTERVENTION;  Surgeon: Marty Heck, MD;  Location: [place] [facility];  Service: Cardiovascular;  Laterality: Right;  SFA  . TEE WITHOUT CARDIOVERSION N/A  12/15/2016   Procedure: TRANSESOPHAGEAL ECHOCARDIOGRAM (TEE);  Surgeon: Sherren Mocha, MD;  Location: [place];  Service: Open Heart Surgery;  Laterality: N/A;  . TRANSCATHETER AORTIC VALVE REPLACEMENT, TRANSFEMORAL N/A 12/15/2016   Procedure: TRANSCATHETER AORTIC VALVE REPLACEMENT, TRANSFEMORAL;  Surgeon: Sherren Mocha, MD;  Location: [place];  Service: Open Heart Surgery;  Laterality: N/A;   Social History   Occupational History  . Not on file  Tobacco Use  . Smoking status: Former Smoker    Types: Cigarettes  . Smokeless tobacco: Never Used  Substance and Sexual Activity  . Alcohol use: Yes    Comment: rarely  . Drug use: No  . Sexual activity: Not on file

## 2019-08-09 ENCOUNTER — Ambulatory Visit: Payer: Medicare HMO | Admitting: Family

## 2019-08-11 ENCOUNTER — Ambulatory Visit (INDEPENDENT_AMBULATORY_CARE_PROVIDER_SITE_OTHER): Payer: Medicare HMO | Admitting: Family

## 2019-08-11 ENCOUNTER — Other Ambulatory Visit: Payer: Self-pay

## 2019-08-11 ENCOUNTER — Encounter: Payer: Self-pay | Admitting: Family

## 2019-08-11 DIAGNOSIS — M2041 Other hammer toe(s) (acquired), right foot: Secondary | ICD-10-CM

## 2019-08-11 DIAGNOSIS — M869 Osteomyelitis, unspecified: Secondary | ICD-10-CM

## 2019-08-11 NOTE — Progress Notes (Signed)
Post-Op Visit Note   Patient: Robert Horn           Date of Birth: Feb 21, 1934           MRN: [id number] Visit Date: 08/11/2019 PCP: [name], MD  Chief Complaint:  Chief Complaint  Patient presents with  . Right Foot - Routine Post Op    HPI:  HPI The patient is an 86 year old gentleman seen today status post right second toe amputation. Has been dressing with dry dressing, ace wrap and Vive compression stocking on top. Heel walking.  Ortho Exam Incision well approximated with sutures. Nearly fully healed. On 2 mm length with one drop of blood. No gaping. No erythema no odor.   Visit Diagnoses:  1. Osteomyelitis of second toe of right foot ([place])   2. Other hammer toe(s) (acquired), right foot     Plan: daily dial soap cleansing. May shower. May get wet. May advance weight bearing in home. Continue out of work no prolonged weight bearing or standing. Follow up for suture removal in one week.  Follow-Up Instructions: No follow-ups on file.   Imaging: No results found.  Orders:  No orders of the defined types were placed in this encounter.  No orders of the defined types were placed in this encounter.    PMFS History: Patient Active Problem List   Diagnosis Date Noted  . Osteomyelitis of second toe of right foot ([place])   . Osteomyelitis of great toe of right foot ([place])   . Severe protein-calorie malnutrition ([place])   . Diabetic polyneuropathy associated with type 2 diabetes mellitus ([place])   . Cutaneous abscess of right foot   . Cellulitis of right foot 01/30/2019  . CKD (chronic kidney disease), stage III 01/30/2019  . Elevated LFTs 01/30/2019  . Colon cancer ([place]) 01/30/2019  . Anemia of chronic disease 01/30/2019  . Acute on chronic combined systolic and diastolic CHF (congestive heart failure) ([place]) 03/09/2018  . AKI (acute kidney injury) ([place]) 03/09/2018  . Colonic mass 12/10/2017  . S/P TAVR (transcatheter aortic valve replacement) 03/03/2017  .  Severe aortic stenosis 12/15/2016  . Essential hypertension 05/09/2013  . Hyperlipidemia 05/09/2013  . PVC's (premature ventricular contractions) 05/09/2013  . Chronic combined systolic and diastolic heart failure ([place]) 05/09/2013   Past Medical History:  Diagnosis Date  . Anemia   . Cancer [facility]) 2019   colon- colectomy   . CHF (congestive heart failure) ([place])   . Diabetes mellitus without complication ([place])    Type II  . Dyslipidemia 10/27/2015  . Dyspnea    w/ exertion   . Elevated PSA 10/27/2015  . Erectile dysfunction 10/27/2015  . Heart murmur   . Hypertension 10/27/2015  . Hypogonadism male 10/27/2015  . Obesity 10/27/2015  . Peripheral vascular disease ([place])   . Pneumonia 10/27/2015   pt states was 1982  . Rotator cuff tear 10/27/2015    Family History  Problem Relation Age of Onset  . Diabetes Mother   . Heart disease Mother   . Pulmonary embolism Father     Past Surgical History:  Procedure Laterality Date  . ABDOMINAL AORTOGRAM N/A 02/01/2019   Procedure: ABDOMINAL AORTOGRAM;  Surgeon: Marty Heck, MD;  Location: [place] [facility];  Service: Cardiovascular;  Laterality: N/A;  . AMPUTATION Right 02/03/2019   Procedure: RIGHT FOOT 1ST RAY AMPUTATION;  Surgeon: [name], MD;  Location: [place];  Service: Orthopedics;  Laterality: Right;  . AMPUTATION Right 08/02/2019  Procedure: RIGHT 2ND TOE AMPUTATION;  Surgeon: [name], MD;  Location: [place];  Service: Orthopedics;  Laterality: Right;  . CARDIAC CATHETERIZATION N/A 11/14/2015   Procedure: Left Heart Cath and Coronary Angiography;  Surgeon: [name], MD;  Location: [place] [facility];  Service: Cardiovascular;  Laterality: N/A;  . COLONOSCOPY    . LAPAROSCOPIC PARTIAL COLECTOMY  12/15/2017   LAPAROSCOPIC PARTIAL COLECTOMY (N/A Abdomen)  . LAPAROSCOPIC PARTIAL COLECTOMY N/A 12/15/2017   Procedure: LAPAROSCOPIC PARTIAL COLECTOMY;  Surgeon: Erroll Luna, MD;  Location: [place];  Service:  General;  Laterality: N/A;  . LOWER EXTREMITY ANGIOGRAPHY Right 02/01/2019   Procedure: LOWER EXTREMITY ANGIOGRAPHY;  Surgeon: Marty Heck, MD;  Location: [place] [facility];  Service: Cardiovascular;  Laterality: Right;  . PERIPHERAL VASCULAR INTERVENTION Right 02/01/2019   Procedure: PERIPHERAL VASCULAR INTERVENTION;  Surgeon: Marty Heck, MD;  Location: [place] [facility];  Service: Cardiovascular;  Laterality: Right;  SFA  . TEE WITHOUT CARDIOVERSION N/A 12/15/2016   Procedure: TRANSESOPHAGEAL ECHOCARDIOGRAM (TEE);  Surgeon: Sherren Mocha, MD;  Location: [place];  Service: Open Heart Surgery;  Laterality: N/A;  . TRANSCATHETER AORTIC VALVE REPLACEMENT, TRANSFEMORAL N/A 12/15/2016   Procedure: TRANSCATHETER AORTIC VALVE REPLACEMENT, TRANSFEMORAL;  Surgeon: Sherren Mocha, MD;  Location: [place];  Service: Open Heart Surgery;  Laterality: N/A;   Social History   Occupational History  . Not on file  Tobacco Use  . Smoking status: Former Smoker    Types: Cigarettes  . Smokeless tobacco: Never Used  Substance and Sexual Activity  . Alcohol use: Yes    Comment: rarely  . Drug use: No  . Sexual activity: Not on file

## 2019-08-14 ENCOUNTER — Ambulatory Visit: Payer: Medicare HMO | Admitting: Physician Assistant

## 2019-08-14 ENCOUNTER — Encounter: Payer: Self-pay | Admitting: Orthopedic Surgery

## 2019-08-18 ENCOUNTER — Other Ambulatory Visit: Payer: Self-pay

## 2019-08-18 ENCOUNTER — Encounter: Payer: Self-pay | Admitting: Family

## 2019-08-18 ENCOUNTER — Ambulatory Visit (INDEPENDENT_AMBULATORY_CARE_PROVIDER_SITE_OTHER): Payer: Medicare HMO | Admitting: Family

## 2019-08-18 VITALS — Ht 72.0 in | Wt 222.0 lb

## 2019-08-18 DIAGNOSIS — M869 Osteomyelitis, unspecified: Secondary | ICD-10-CM

## 2019-08-18 DIAGNOSIS — M2041 Other hammer toe(s) (acquired), right foot: Secondary | ICD-10-CM

## 2019-08-18 NOTE — Progress Notes (Signed)
Post-Op Visit Note   Patient: Robert Horn           Date of Birth: Nov 03, 1933           MRN: [id number] Visit Date: 08/18/2019 PCP: [name], MD  Chief Complaint:  Chief Complaint  Patient presents with  . Right Foot - Routine Post Op    08/02/19 right 2nd toe amputation     HPI:  HPI The patient is a 86 year old gentleman seen today status post right foot second toe amputation on May 5 this is well-healed.  The sutures are in place he has been wearing a compression stocking around-the-clock. Ortho Exam Incision well approximated well-healed these sutures were harvested today without incident there is no gaping no erythema no sign of infection does have flexible clawing of the third and fourth toes  Visit Diagnoses:  1. Other hammer toe(s) (acquired), right foot   2. Osteomyelitis of second toe of right foot ([place])     Plan: We will continue with compression stocking.  Provided with a claw toe pad.  He will follow-up in office once more before releasing.  Follow-Up Instructions: Return in about 2 weeks (around 09/01/2019).   Imaging: No results found.  Orders:  No orders of the defined types were placed in this encounter.  No orders of the defined types were placed in this encounter.    PMFS History: Patient Active Problem List   Diagnosis Date Noted  . Osteomyelitis of second toe of right foot ([place])   . Osteomyelitis of great toe of right foot ([place])   . Severe protein-calorie malnutrition ([place])   . Diabetic polyneuropathy associated with type 2 diabetes mellitus ([place])   . Cutaneous abscess of right foot   . Cellulitis of right foot 01/30/2019  . CKD (chronic kidney disease), stage III 01/30/2019  . Elevated LFTs 01/30/2019  . Colon cancer ([place]) 01/30/2019  . Anemia of chronic disease 01/30/2019  . Acute on chronic combined systolic and diastolic CHF (congestive heart failure) ([place]) 03/09/2018  . AKI (acute kidney injury) ([place]) 03/09/2018  . Colonic mass  12/10/2017  . S/P TAVR (transcatheter aortic valve replacement) 03/03/2017  . Severe aortic stenosis 12/15/2016  . Essential hypertension 05/09/2013  . Hyperlipidemia 05/09/2013  . PVC's (premature ventricular contractions) 05/09/2013  . Chronic combined systolic and diastolic heart failure ([place]) 05/09/2013   Past Medical History:  Diagnosis Date  . Anemia   . Cancer [facility]) 2019   colon- colectomy   . CHF (congestive heart failure) ([place])   . Diabetes mellitus without complication ([place])    Type II  . Dyslipidemia 10/27/2015  . Dyspnea    w/ exertion   . Elevated PSA 10/27/2015  . Erectile dysfunction 10/27/2015  . Heart murmur   . Hypertension 10/27/2015  . Hypogonadism male 10/27/2015  . Obesity 10/27/2015  . Peripheral vascular disease ([place])   . Pneumonia 10/27/2015   pt states was 1982  . Rotator cuff tear 10/27/2015    Family History  Problem Relation Age of Onset  . Diabetes Mother   . Heart disease Mother   . Pulmonary embolism Father     Past Surgical History:  Procedure Laterality Date  . ABDOMINAL AORTOGRAM N/A 02/01/2019   Procedure: ABDOMINAL AORTOGRAM;  Surgeon: Marty Heck, MD;  Location: [place] [facility];  Service: Cardiovascular;  Laterality: N/A;  . AMPUTATION Right 02/03/2019   Procedure: RIGHT FOOT 1ST RAY AMPUTATION;  Surgeon: [name], MD;  Location: [place];  Service: Orthopedics;  Laterality: Right;  . AMPUTATION Right 08/02/2019   Procedure: RIGHT 2ND TOE AMPUTATION;  Surgeon: [name], MD;  Location: [place];  Service: Orthopedics;  Laterality: Right;  . CARDIAC CATHETERIZATION N/A 11/14/2015   Procedure: Left Heart Cath and Coronary Angiography;  Surgeon: [name], MD;  Location: [place] [facility];  Service: Cardiovascular;  Laterality: N/A;  . COLONOSCOPY    . LAPAROSCOPIC PARTIAL COLECTOMY  12/15/2017   LAPAROSCOPIC PARTIAL COLECTOMY (N/A Abdomen)  . LAPAROSCOPIC PARTIAL COLECTOMY N/A 12/15/2017   Procedure: LAPAROSCOPIC  PARTIAL COLECTOMY;  Surgeon: Erroll Luna, MD;  Location: [place];  Service: General;  Laterality: N/A;  . LOWER EXTREMITY ANGIOGRAPHY Right 02/01/2019   Procedure: LOWER EXTREMITY ANGIOGRAPHY;  Surgeon: Marty Heck, MD;  Location: [place] [facility];  Service: Cardiovascular;  Laterality: Right;  . PERIPHERAL VASCULAR INTERVENTION Right 02/01/2019   Procedure: PERIPHERAL VASCULAR INTERVENTION;  Surgeon: Marty Heck, MD;  Location: [place] [facility];  Service: Cardiovascular;  Laterality: Right;  SFA  . TEE WITHOUT CARDIOVERSION N/A 12/15/2016   Procedure: TRANSESOPHAGEAL ECHOCARDIOGRAM (TEE);  Surgeon: Sherren Mocha, MD;  Location: [place];  Service: Open Heart Surgery;  Laterality: N/A;  . TRANSCATHETER AORTIC VALVE REPLACEMENT, TRANSFEMORAL N/A 12/15/2016   Procedure: TRANSCATHETER AORTIC VALVE REPLACEMENT, TRANSFEMORAL;  Surgeon: Sherren Mocha, MD;  Location: [place];  Service: Open Heart Surgery;  Laterality: N/A;   Social History   Occupational History  . Not on file  Tobacco Use  . Smoking status: Former Smoker    Types: Cigarettes  . Smokeless tobacco: Never Used  Substance and Sexual Activity  . Alcohol use: Yes    Comment: rarely  . Drug use: No  . Sexual activity: Not on file

## 2019-08-31 DIAGNOSIS — S98311A Complete traumatic amputation of right midfoot, initial encounter: Secondary | ICD-10-CM | POA: Diagnosis not present

## 2019-09-04 DIAGNOSIS — E7800 Pure hypercholesterolemia, unspecified: Secondary | ICD-10-CM | POA: Diagnosis not present

## 2019-09-04 DIAGNOSIS — I739 Peripheral vascular disease, unspecified: Secondary | ICD-10-CM | POA: Diagnosis not present

## 2019-09-04 DIAGNOSIS — Z952 Presence of prosthetic heart valve: Secondary | ICD-10-CM | POA: Diagnosis not present

## 2019-09-04 DIAGNOSIS — Z85038 Personal history of other malignant neoplasm of large intestine: Secondary | ICD-10-CM | POA: Diagnosis not present

## 2019-09-04 DIAGNOSIS — E1159 Type 2 diabetes mellitus with other circulatory complications: Secondary | ICD-10-CM | POA: Diagnosis not present

## 2019-09-04 DIAGNOSIS — I10 Essential (primary) hypertension: Secondary | ICD-10-CM | POA: Diagnosis not present

## 2019-09-04 DIAGNOSIS — I5040 Unspecified combined systolic (congestive) and diastolic (congestive) heart failure: Secondary | ICD-10-CM | POA: Diagnosis not present

## 2019-09-13 ENCOUNTER — Telehealth: Payer: Self-pay | Admitting: Oncology

## 2019-09-13 NOTE — Telephone Encounter (Signed)
Rescheduled 06/17 appointment to 06/18 per patient's request.

## 2019-09-14 ENCOUNTER — Inpatient Hospital Stay: Payer: Medicare HMO | Admitting: Nurse Practitioner

## 2019-09-14 ENCOUNTER — Inpatient Hospital Stay: Payer: Medicare HMO

## 2019-09-15 ENCOUNTER — Encounter: Payer: Self-pay | Admitting: Nurse Practitioner

## 2019-09-15 ENCOUNTER — Inpatient Hospital Stay: Payer: Medicare HMO | Admitting: Nurse Practitioner

## 2019-09-15 ENCOUNTER — Other Ambulatory Visit: Payer: Self-pay

## 2019-09-15 ENCOUNTER — Inpatient Hospital Stay: Payer: Medicare HMO | Attending: Nurse Practitioner

## 2019-09-15 VITALS — BP 138/91 | HR 68 | Temp 98.2°F | Resp 16 | Ht 72.0 in | Wt 226.4 lb

## 2019-09-15 DIAGNOSIS — C184 Malignant neoplasm of transverse colon: Secondary | ICD-10-CM

## 2019-09-15 DIAGNOSIS — Z85038 Personal history of other malignant neoplasm of large intestine: Secondary | ICD-10-CM | POA: Insufficient documentation

## 2019-09-15 DIAGNOSIS — I110 Hypertensive heart disease with heart failure: Secondary | ICD-10-CM | POA: Insufficient documentation

## 2019-09-15 DIAGNOSIS — N400 Enlarged prostate without lower urinary tract symptoms: Secondary | ICD-10-CM | POA: Diagnosis not present

## 2019-09-15 DIAGNOSIS — I509 Heart failure, unspecified: Secondary | ICD-10-CM | POA: Insufficient documentation

## 2019-09-15 LAB — CEA (IN HOUSE-CHCC): CEA (CHCC-In House): 2.45 ng/mL (ref 0.00–5.00)

## 2019-09-15 NOTE — Progress Notes (Signed)
  [place] OFFICE PROGRESS NOTE   Diagnosis: Colon cancer  INTERVAL HISTORY:   Robert Horn returns as scheduled.  He underwent amputation of the right second toe due to osteomyelitis 08/02/2019.  He feels he is recovering well.  No change in bowel habits.  No abdominal pain.  No bleeding with bowel movements.  He has a good appetite.  Objective:  Vital signs in last 24 hours:  Blood pressure (!) 138/91, pulse 68, temperature 98.2 F (36.8 C), temperature source Temporal, resp. rate 16, height 6' (1.829 m), weight 226 lb 6.4 oz (102.7 kg), SpO2 100 %.    HEENT: Neck without mass. Lymphatics: No palpable cervical, supraclavicular, axillary, inguinal lymph nodes.  Fullness left medial inguinal region. Resp: Lungs clear bilaterally. Cardio: Regular rate and rhythm. GI: Abdomen soft and nontender.  No hepatomegaly.  No mass. Vascular: No leg edema.    Lab Results:  Lab Results  Component Value Date   WBC 4.9 08/02/2019   HGB 12.1 (L) 08/02/2019   HCT 38.5 (L) 08/02/2019   MCV 91.9 08/02/2019   PLT 158 08/02/2019   NEUTROABS 9.0 (H) 01/30/2019    Imaging:  No results found.  Medications: I have reviewed the patient's current medications.  Assessment/Plan: 1. Colon cancer, transverse, stage II (T3N0), status post a right and transverse colectomy 12/15/2017 ? 0/25 lymph nodes positive, no lymphovascular or perineural invasion ? MSI stable, no loss of mismatch repair protein expression ? CTs 12/10/2017-transverse colon wall thickening, 8 mm transverse mesocolon lymph node, no evidence of metastatic disease, stable small bilateral pulmonary nodules ? Colonoscopy 12/09/2017-transverse colon mass, sessile polyps in the descending colon-not removed 2. Polyps on the 12/15/2017 resection specimen-tubular adenomas 3. Enlarged prostate with elevated PSA 4. Status post TAVR 12/15/2016 5. Hypertension 6. Pneumonia 1982 7. History of CHF  Disposition: Robert Horn  remains in clinical remission from colon cancer.  We will follow-up on the CEA from today.  He will return for a CEA and follow-up visit in 6 months.    Ned Card ANP/GNP-BC   09/15/2019  3:07 PM

## 2019-09-19 ENCOUNTER — Telehealth: Payer: Self-pay | Admitting: *Deleted

## 2019-09-19 ENCOUNTER — Telehealth: Payer: Self-pay | Admitting: Nurse Practitioner

## 2019-09-19 NOTE — Telephone Encounter (Signed)
Left VM for patient to return call for lab results. °

## 2019-09-19 NOTE — Telephone Encounter (Signed)
Scheduled appts per 6/18 los. Left voicemail with appt date and time.  

## 2019-09-20 NOTE — Telephone Encounter (Signed)
Notified wife that CEA is still in normal range.

## 2019-09-27 DIAGNOSIS — I5040 Unspecified combined systolic (congestive) and diastolic (congestive) heart failure: Secondary | ICD-10-CM | POA: Diagnosis not present

## 2019-09-27 DIAGNOSIS — E1169 Type 2 diabetes mellitus with other specified complication: Secondary | ICD-10-CM | POA: Diagnosis not present

## 2019-09-27 DIAGNOSIS — D509 Iron deficiency anemia, unspecified: Secondary | ICD-10-CM | POA: Diagnosis not present

## 2019-09-27 DIAGNOSIS — C182 Malignant neoplasm of ascending colon: Secondary | ICD-10-CM | POA: Diagnosis not present

## 2019-09-27 DIAGNOSIS — E7800 Pure hypercholesterolemia, unspecified: Secondary | ICD-10-CM | POA: Diagnosis not present

## 2019-09-27 DIAGNOSIS — C184 Malignant neoplasm of transverse colon: Secondary | ICD-10-CM | POA: Diagnosis not present

## 2019-09-27 DIAGNOSIS — Z85038 Personal history of other malignant neoplasm of large intestine: Secondary | ICD-10-CM | POA: Diagnosis not present

## 2019-09-27 DIAGNOSIS — N401 Enlarged prostate with lower urinary tract symptoms: Secondary | ICD-10-CM | POA: Diagnosis not present

## 2019-09-27 DIAGNOSIS — E1159 Type 2 diabetes mellitus with other circulatory complications: Secondary | ICD-10-CM | POA: Diagnosis not present

## 2019-09-27 DIAGNOSIS — I10 Essential (primary) hypertension: Secondary | ICD-10-CM | POA: Diagnosis not present

## 2019-09-28 ENCOUNTER — Other Ambulatory Visit: Payer: Self-pay | Admitting: Interventional Cardiology

## 2019-12-05 ENCOUNTER — Other Ambulatory Visit: Payer: Self-pay | Admitting: Interventional Cardiology

## 2019-12-05 DIAGNOSIS — E1165 Type 2 diabetes mellitus with hyperglycemia: Secondary | ICD-10-CM | POA: Diagnosis not present

## 2019-12-05 DIAGNOSIS — R0789 Other chest pain: Secondary | ICD-10-CM | POA: Diagnosis not present

## 2019-12-05 DIAGNOSIS — E1159 Type 2 diabetes mellitus with other circulatory complications: Secondary | ICD-10-CM | POA: Diagnosis not present

## 2019-12-05 DIAGNOSIS — I739 Peripheral vascular disease, unspecified: Secondary | ICD-10-CM | POA: Diagnosis not present

## 2019-12-05 DIAGNOSIS — Z952 Presence of prosthetic heart valve: Secondary | ICD-10-CM | POA: Diagnosis not present

## 2019-12-05 DIAGNOSIS — E7800 Pure hypercholesterolemia, unspecified: Secondary | ICD-10-CM | POA: Diagnosis not present

## 2019-12-05 DIAGNOSIS — I10 Essential (primary) hypertension: Secondary | ICD-10-CM | POA: Diagnosis not present

## 2019-12-05 DIAGNOSIS — N1831 Chronic kidney disease, stage 3a: Secondary | ICD-10-CM | POA: Diagnosis not present

## 2019-12-06 NOTE — Telephone Encounter (Signed)
Pt's pharmacy is requesting a refill on Entresto. This medication has expired off of pt's medication list. Does pt still suppose to be taking this medication? If so can you please add medication back to pt's medication list. Please address

## 2019-12-12 DIAGNOSIS — Z85038 Personal history of other malignant neoplasm of large intestine: Secondary | ICD-10-CM | POA: Diagnosis not present

## 2019-12-12 DIAGNOSIS — I5040 Unspecified combined systolic (congestive) and diastolic (congestive) heart failure: Secondary | ICD-10-CM | POA: Diagnosis not present

## 2019-12-12 DIAGNOSIS — E1169 Type 2 diabetes mellitus with other specified complication: Secondary | ICD-10-CM | POA: Diagnosis not present

## 2019-12-12 DIAGNOSIS — E7800 Pure hypercholesterolemia, unspecified: Secondary | ICD-10-CM | POA: Diagnosis not present

## 2019-12-12 DIAGNOSIS — C182 Malignant neoplasm of ascending colon: Secondary | ICD-10-CM | POA: Diagnosis not present

## 2019-12-12 DIAGNOSIS — I10 Essential (primary) hypertension: Secondary | ICD-10-CM | POA: Diagnosis not present

## 2019-12-12 DIAGNOSIS — D509 Iron deficiency anemia, unspecified: Secondary | ICD-10-CM | POA: Diagnosis not present

## 2019-12-12 DIAGNOSIS — C184 Malignant neoplasm of transverse colon: Secondary | ICD-10-CM | POA: Diagnosis not present

## 2019-12-12 DIAGNOSIS — E1159 Type 2 diabetes mellitus with other circulatory complications: Secondary | ICD-10-CM | POA: Diagnosis not present

## 2019-12-12 DIAGNOSIS — N401 Enlarged prostate with lower urinary tract symptoms: Secondary | ICD-10-CM | POA: Diagnosis not present

## 2019-12-23 NOTE — Progress Notes (Signed)
Cardiology Office Note:    Date:  12/28/2019   ID:  Robert Horn, DOB 10/13/1933, MRN [id number]  PCP:  [name], MD  Cardiologist:  Sinclair Grooms, MD   Referring MD: [name], MD   Chief Complaint  Patient presents with  . Congestive Heart Failure  . Hyperlipidemia  . Hypertension    History of Present Illness:    Robert Horn is a 86 y.o. male with a hx of chronic combined S/D CHF, HTN, HLD, colon cancerand severe aortic stenosis s/p TAVR (12/15/16).EF was down, 25-30,in setting of severe aortic stenosis. About a monthafter TAVR echocardiogram showed improvement in EF to 45-50% with mean gradient of 12 mmmHg.Most recent LVEF 35 to 40%.S/P amputation right great toe and now with ulcer second toe.  Doing well.  Playing golf.  No shortness of breath.  He denies orthopnea and chest pain.  No episodes of syncope or near syncope.  Medication adjustments have been made.  He is on Jardiance 25 mg/day.  All in all he is quite pleased with how he is doing from a cardiac standpoint.  He is status post lower extremity toe amputation on his right foot after developing a nonhealing ulcer and osteomyelitis in the setting of unrecognized PAD.  Everything in his lower extremity has healed.  Past Medical History:  Diagnosis Date  . Anemia   . Cancer [facility]) 2019   colon- colectomy   . CHF (congestive heart failure) ([place])   . Diabetes mellitus without complication ([place])    Type II  . Dyslipidemia 10/27/2015  . Dyspnea    w/ exertion   . Elevated PSA 10/27/2015  . Erectile dysfunction 10/27/2015  . Heart murmur   . Hypertension 10/27/2015  . Hypogonadism male 10/27/2015  . Obesity 10/27/2015  . Peripheral vascular disease ([place])   . Pneumonia 10/27/2015   pt states was 1982  . Rotator cuff tear 10/27/2015    Past Surgical History:  Procedure Laterality Date  . ABDOMINAL AORTOGRAM N/A 02/01/2019   Procedure: ABDOMINAL AORTOGRAM;  Surgeon: Marty Heck,  MD;  Location: [place] [facility];  Service: Cardiovascular;  Laterality: N/A;  . AMPUTATION Right 02/03/2019   Procedure: RIGHT FOOT 1ST RAY AMPUTATION;  Surgeon: [name], MD;  Location: [place];  Service: Orthopedics;  Laterality: Right;  . AMPUTATION Right 08/02/2019   Procedure: RIGHT 2ND TOE AMPUTATION;  Surgeon: [name], MD;  Location: [place];  Service: Orthopedics;  Laterality: Right;  . CARDIAC CATHETERIZATION N/A 11/14/2015   Procedure: Left Heart Cath and Coronary Angiography;  Surgeon: [name], MD;  Location: [place] [facility];  Service: Cardiovascular;  Laterality: N/A;  . COLONOSCOPY    . LAPAROSCOPIC PARTIAL COLECTOMY  12/15/2017   LAPAROSCOPIC PARTIAL COLECTOMY (N/A Abdomen)  . LAPAROSCOPIC PARTIAL COLECTOMY N/A 12/15/2017   Procedure: LAPAROSCOPIC PARTIAL COLECTOMY;  Surgeon: Erroll Luna, MD;  Location: [place];  Service: General;  Laterality: N/A;  . LOWER EXTREMITY ANGIOGRAPHY Right 02/01/2019   Procedure: LOWER EXTREMITY ANGIOGRAPHY;  Surgeon: Marty Heck, MD;  Location: [place] [facility];  Service: Cardiovascular;  Laterality: Right;  . PERIPHERAL VASCULAR INTERVENTION Right 02/01/2019   Procedure: PERIPHERAL VASCULAR INTERVENTION;  Surgeon: Marty Heck, MD;  Location: [place] [facility];  Service: Cardiovascular;  Laterality: Right;  SFA  . TEE WITHOUT CARDIOVERSION N/A 12/15/2016   Procedure: TRANSESOPHAGEAL ECHOCARDIOGRAM (TEE);  Surgeon: Sherren Mocha, MD;  Location: [place];  Service: Open Heart Surgery;  Laterality: N/A;  .  TRANSCATHETER AORTIC VALVE REPLACEMENT, TRANSFEMORAL N/A 12/15/2016   Procedure: TRANSCATHETER AORTIC VALVE REPLACEMENT, TRANSFEMORAL;  Surgeon: Sherren Mocha, MD;  Location: [place];  Service: Open Heart Surgery;  Laterality: N/A;    Current Medications: Current Meds  Medication Sig  . amLODipine (NORVASC) 10 MG tablet TAKE 1 TABLET BY MOUTH EVERY DAY  . aspirin (ASPIRIN EC) 81 MG EC tablet Take 81 mg by mouth  daily. Swallow whole.  . carvedilol (COREG) 3.125 MG tablet TAKE 1 TABLET (3.125 MG TOTAL) BY MOUTH 2 (TWO) TIMES DAILY.  Marland Kitchen clopidogrel (PLAVIX) 75 MG tablet TAKE 1 TABLET (75 MG TOTAL) BY MOUTH DAILY WITH BREAKFAST.  Marland Kitchen ENTRESTO 49-51 MG TAKE 1 TABLET BY MOUTH TWICE A DAY  . ferrous sulfate 325 (65 FE) MG tablet Take 325 mg by mouth daily with breakfast.  . furosemide (LASIX) 40 MG tablet TAKE 1.5 TABLETS BY MOUTH DAILY.  Marland Kitchen JARDIANCE 25 MG TABS tablet Take 25 mg by mouth daily.  . Multiple Vitamin (MULTIVITAMIN WITH MINERALS) TABS tablet Take 1 tablet by mouth daily.  . naproxen sodium (ALEVE) 220 MG tablet Take 220 mg by mouth daily as needed (pain).  Marland Kitchen omega-3 acid ethyl esters (LOVAZA) 1 g capsule TAKE 1 CAPSULE BY MOUTH EVERY DAY  . oxyCODONE-acetaminophen (PERCOCET) 5-325 MG tablet Take 1 tablet by mouth every 4 (four) hours as needed.  . rosuvastatin (CRESTOR) 20 MG tablet Take 20 mg by mouth daily.  Marland Kitchen VITAMIN B COMPLEX-C PO Take 1 tablet by mouth daily.  . [DISCONTINUED] JARDIANCE 10 MG TABS tablet Take 10 mg by mouth daily.  . [DISCONTINUED] rosuvastatin (CRESTOR) 10 MG tablet Take 10 mg by mouth daily at 2 PM. takes at 1400     Allergies:   Patient has no known allergies.   Social History   Socioeconomic History  . Marital status: Single    Spouse name: Not on file  . Number of children: Not on file  . Years of education: Not on file  . Highest education level: Not on file  Occupational History  . Not on file  Tobacco Use  . Smoking status: Former Smoker    Types: Cigarettes  . Smokeless tobacco: Never Used  Vaping Use  . Vaping Use: Never used  Substance and Sexual Activity  . Alcohol use: Yes    Comment: rarely  . Drug use: No  . Sexual activity: Not on file  Other Topics Concern  . Not on file  Social History Narrative  . Not on file   Social Determinants of Health   Financial Resource Strain:   . Difficulty of Paying Living Expenses: Not on file  Food  Insecurity:   . Worried About Charity fundraiser in the Last Year: Not on file  . Ran Out of Food in the Last Year: Not on file  Transportation Needs:   . Lack of Transportation (Medical): Not on file  . Lack of Transportation (Non-Medical): Not on file  Physical Activity:   . Days of Exercise per Week: Not on file  . Minutes of Exercise per Session: Not on file  Stress:   . Feeling of Stress : Not on file  Social Connections:   . Frequency of Communication with Friends and Family: Not on file  . Frequency of Social Gatherings with Friends and Family: Not on file  . Attends Religious Services: Not on file  . Active Member of Clubs or Organizations: Not on file  . Attends Archivist Meetings:  Not on file  . Marital Status: Not on file     Family History: The patient's family history includes Diabetes in his mother; Heart disease in his mother; Pulmonary embolism in his father.  ROS:   Please see the history of present illness.    He is having overflow incontinence.  This is troubling to him.  He has a catch in his right side that occurs from time to time.  He was told when he was younger he had pleurisy.  He is having some phantom pain in the area of his amputated toes.  All other systems reviewed and are negative.  EKGs/Labs/Other Studies Reviewed:    The following studies were reviewed today:  Bilateral LE Doppler 06/2019: Summary:  Right: Resting right ankle-brachial index is within normal range. No  evidence of significant right lower extremity arterial disease.   Left: Resting left ankle-brachial index indicates moderate left lower  extremity arterial disease. The left toe-brachial index is abnormal.   EKG:  EKG not repeated  Recent Labs: 02/06/2019: ALT 28; Magnesium 2.1 08/02/2019: BUN 24; Creatinine, Ser 1.51; Hemoglobin 12.1; Platelets 158; Potassium 4.3; Sodium 138  Recent Lipid Panel    Component Value Date/Time   CHOL 110 01/30/2019 2350   TRIG 87  01/30/2019 2350   HDL 22 (L) 01/30/2019 2350   CHOLHDL 5.0 01/30/2019 2350   VLDL 17 01/30/2019 2350   LDLCALC 71 01/30/2019 2350    Physical Exam:    VS:  BP 132/72   Pulse (!) 58   Ht 6' (1.829 m)   Wt 225 lb 3.2 oz (102.2 kg)   SpO2 99%   BMI 30.54 kg/m     Wt Readings from Last 3 Encounters:  12/28/19 225 lb 3.2 oz (102.2 kg)  09/15/19 226 lb 6.4 oz (102.7 kg)  08/18/19 222 lb (100.7 kg)     GEN: Obese but looks healthy. No acute distress HEENT: Normal NECK: No JVD. LYMPHATICS: No lymphadenopathy CARDIAC: Slight intermittent irregularity but otherwise RR with low pitched right upper sternal systolic murmur, gallop, or edema. VASCULAR: Diminished pulses right posterior tibial and left posterior tibial.  Normal radial and carotid pulses. No bruits. RESPIRATORY:  Clear to auscultation without rales, wheezing or rhonchi  ABDOMEN: Soft, non-tender, non-distended, No pulsatile mass, MUSCULOSKELETAL: No deformity  SKIN: Warm and dry NEUROLOGIC:  Alert and oriented x 3 PSYCHIATRIC:  Normal affect   ASSESSMENT:    1. S/P TAVR (transcatheter aortic valve replacement)   2. Chronic combined systolic and diastolic heart failure ([place])   3. CKD (chronic kidney disease) stage 2, GFR 60-89 ml/min   4. PAD (peripheral artery disease) ([place])   5. Essential hypertension   6. Mixed hyperlipidemia   7. Educated about COVID-19 virus infection    PLAN:    In order of problems listed above:  1. Normal functioning based upon clinical exam.  No symptoms to suggest valve dysfunction. 2. Continue updated heart failure regimen which includes empagliflozin 25 mg daily, carvedilol 3.125 mg p.o. twice daily, Entresto 49/51 mg twice daily and furosemide 40 mg 1.5 tablets by mouth daily. 3. Continue Jardiance and continue monitoring. 4. Trental has been discontinued.  Consider low-dose rivaroxaban.  Will discuss with vascular surgery. 5. Continue aspirin and Plavix. 6. Norvasc 10 mg/day along  with heart failure therapy is maintaining a guideline directed blood pressure less than 140/80 mmHg.  A 2.3 g/day sodium limit is discussed with the patient. 7. Continue Crestor 20 mg/day.  This dose was  uptitrated by Dr. Delfina Redwood.  This is a great idea to get LDL less than 70.  In July it was 109. 8. He has been vaccinated and is doing well.  He is still practicing but at his own pace.  He is looking forward to a booster.  Guideline directed therapy for left ventricular systolic dysfunction: Angiotensin receptor-neprilysin inhibitor (ARNI)-Entresto; beta-blocker therapy - carvedilol, metoprolol succinate, or bisoprolol; mineralocorticoid receptor antagonist (MRA) therapy -spironolactone or eplerenone.  SGLT-2 agents -  Dapagliflozin [place]) or Empagliflozin (Jardiance).These therapies have been shown to improve clinical outcomes including reduction of rehospitalization, survival, and acute heart failure.  The patient will need a basic metabolic panel and brain natruretic peptide on follow-up in 6 months.  He is having overflow incontinence.  I recommended that he speak with Dr. Delfina Redwood about this and perhaps see a urologist.  Perhaps a alpha-blocker would be helpful in managing overflow incontinence.  Medication Adjustments/Labs and Tests Ordered: Current medicines are reviewed at length with the patient today.  Concerns regarding medicines are outlined above.  No orders of the defined types were placed in this encounter.  No orders of the defined types were placed in this encounter.   There are no Patient Instructions on file for this visit.   Signed, Sinclair Grooms, MD  12/28/2019 1:50 PM    [place]

## 2019-12-28 ENCOUNTER — Other Ambulatory Visit: Payer: Self-pay

## 2019-12-28 ENCOUNTER — Encounter: Payer: Self-pay | Admitting: Interventional Cardiology

## 2019-12-28 ENCOUNTER — Ambulatory Visit (INDEPENDENT_AMBULATORY_CARE_PROVIDER_SITE_OTHER): Payer: Medicare HMO | Admitting: Interventional Cardiology

## 2019-12-28 VITALS — BP 132/72 | HR 58 | Ht 72.0 in | Wt 225.2 lb

## 2019-12-28 DIAGNOSIS — N182 Chronic kidney disease, stage 2 (mild): Secondary | ICD-10-CM | POA: Diagnosis not present

## 2019-12-28 DIAGNOSIS — E782 Mixed hyperlipidemia: Secondary | ICD-10-CM

## 2019-12-28 DIAGNOSIS — I10 Essential (primary) hypertension: Secondary | ICD-10-CM

## 2019-12-28 DIAGNOSIS — I739 Peripheral vascular disease, unspecified: Secondary | ICD-10-CM

## 2019-12-28 DIAGNOSIS — Z952 Presence of prosthetic heart valve: Secondary | ICD-10-CM

## 2019-12-28 DIAGNOSIS — Z7189 Other specified counseling: Secondary | ICD-10-CM

## 2019-12-28 DIAGNOSIS — I5042 Chronic combined systolic (congestive) and diastolic (congestive) heart failure: Secondary | ICD-10-CM | POA: Diagnosis not present

## 2019-12-28 NOTE — Patient Instructions (Signed)

## 2020-01-11 ENCOUNTER — Other Ambulatory Visit: Payer: Self-pay | Admitting: Interventional Cardiology

## 2020-01-26 DIAGNOSIS — C184 Malignant neoplasm of transverse colon: Secondary | ICD-10-CM | POA: Diagnosis not present

## 2020-01-26 DIAGNOSIS — I10 Essential (primary) hypertension: Secondary | ICD-10-CM | POA: Diagnosis not present

## 2020-01-26 DIAGNOSIS — E1159 Type 2 diabetes mellitus with other circulatory complications: Secondary | ICD-10-CM | POA: Diagnosis not present

## 2020-01-26 DIAGNOSIS — D509 Iron deficiency anemia, unspecified: Secondary | ICD-10-CM | POA: Diagnosis not present

## 2020-01-26 DIAGNOSIS — Z85038 Personal history of other malignant neoplasm of large intestine: Secondary | ICD-10-CM | POA: Diagnosis not present

## 2020-01-26 DIAGNOSIS — I5040 Unspecified combined systolic (congestive) and diastolic (congestive) heart failure: Secondary | ICD-10-CM | POA: Diagnosis not present

## 2020-01-26 DIAGNOSIS — N401 Enlarged prostate with lower urinary tract symptoms: Secondary | ICD-10-CM | POA: Diagnosis not present

## 2020-01-26 DIAGNOSIS — C182 Malignant neoplasm of ascending colon: Secondary | ICD-10-CM | POA: Diagnosis not present

## 2020-01-26 DIAGNOSIS — E1169 Type 2 diabetes mellitus with other specified complication: Secondary | ICD-10-CM | POA: Diagnosis not present

## 2020-01-26 DIAGNOSIS — E7800 Pure hypercholesterolemia, unspecified: Secondary | ICD-10-CM | POA: Diagnosis not present

## 2020-01-29 DIAGNOSIS — D509 Iron deficiency anemia, unspecified: Secondary | ICD-10-CM | POA: Diagnosis not present

## 2020-01-29 DIAGNOSIS — R8290 Unspecified abnormal findings in urine: Secondary | ICD-10-CM | POA: Diagnosis not present

## 2020-01-29 DIAGNOSIS — Z85038 Personal history of other malignant neoplasm of large intestine: Secondary | ICD-10-CM | POA: Diagnosis not present

## 2020-01-29 DIAGNOSIS — I5040 Unspecified combined systolic (congestive) and diastolic (congestive) heart failure: Secondary | ICD-10-CM | POA: Diagnosis not present

## 2020-01-29 DIAGNOSIS — I10 Essential (primary) hypertension: Secondary | ICD-10-CM | POA: Diagnosis not present

## 2020-01-29 DIAGNOSIS — E7800 Pure hypercholesterolemia, unspecified: Secondary | ICD-10-CM | POA: Diagnosis not present

## 2020-01-29 DIAGNOSIS — I739 Peripheral vascular disease, unspecified: Secondary | ICD-10-CM | POA: Diagnosis not present

## 2020-01-29 DIAGNOSIS — E1159 Type 2 diabetes mellitus with other circulatory complications: Secondary | ICD-10-CM | POA: Diagnosis not present

## 2020-01-29 DIAGNOSIS — Z7984 Long term (current) use of oral hypoglycemic drugs: Secondary | ICD-10-CM | POA: Diagnosis not present

## 2020-02-09 DIAGNOSIS — C182 Malignant neoplasm of ascending colon: Secondary | ICD-10-CM | POA: Diagnosis not present

## 2020-02-09 DIAGNOSIS — C184 Malignant neoplasm of transverse colon: Secondary | ICD-10-CM | POA: Diagnosis not present

## 2020-02-09 DIAGNOSIS — I5040 Unspecified combined systolic (congestive) and diastolic (congestive) heart failure: Secondary | ICD-10-CM | POA: Diagnosis not present

## 2020-02-09 DIAGNOSIS — Z85038 Personal history of other malignant neoplasm of large intestine: Secondary | ICD-10-CM | POA: Diagnosis not present

## 2020-02-09 DIAGNOSIS — E1169 Type 2 diabetes mellitus with other specified complication: Secondary | ICD-10-CM | POA: Diagnosis not present

## 2020-02-09 DIAGNOSIS — D509 Iron deficiency anemia, unspecified: Secondary | ICD-10-CM | POA: Diagnosis not present

## 2020-02-09 DIAGNOSIS — I10 Essential (primary) hypertension: Secondary | ICD-10-CM | POA: Diagnosis not present

## 2020-02-09 DIAGNOSIS — E7800 Pure hypercholesterolemia, unspecified: Secondary | ICD-10-CM | POA: Diagnosis not present

## 2020-02-09 DIAGNOSIS — E1159 Type 2 diabetes mellitus with other circulatory complications: Secondary | ICD-10-CM | POA: Diagnosis not present

## 2020-02-09 DIAGNOSIS — N401 Enlarged prostate with lower urinary tract symptoms: Secondary | ICD-10-CM | POA: Diagnosis not present

## 2020-02-18 ENCOUNTER — Other Ambulatory Visit: Payer: Self-pay | Admitting: Interventional Cardiology

## 2020-02-19 ENCOUNTER — Other Ambulatory Visit: Payer: Self-pay

## 2020-02-19 MED ORDER — AMLODIPINE BESYLATE 10 MG PO TABS
10.0000 mg | ORAL_TABLET | Freq: Every day | ORAL | 2 refills | Status: DC
Start: 2020-02-19 — End: 2020-11-11

## 2020-02-27 ENCOUNTER — Other Ambulatory Visit: Payer: Self-pay | Admitting: Internal Medicine

## 2020-02-27 ENCOUNTER — Ambulatory Visit
Admission: RE | Admit: 2020-02-27 | Discharge: 2020-02-27 | Disposition: A | Discharge status: Home / Self Care | Payer: Medicare HMO | Source: Ambulatory Visit | Attending: Internal Medicine | Admitting: Internal Medicine

## 2020-02-27 DIAGNOSIS — M7731 Calcaneal spur, right foot: Secondary | ICD-10-CM | POA: Diagnosis not present

## 2020-02-27 DIAGNOSIS — S91301A Unspecified open wound, right foot, initial encounter: Secondary | ICD-10-CM | POA: Diagnosis not present

## 2020-02-27 DIAGNOSIS — M79671 Pain in right foot: Secondary | ICD-10-CM

## 2020-02-27 DIAGNOSIS — I708 Atherosclerosis of other arteries: Secondary | ICD-10-CM | POA: Diagnosis not present

## 2020-02-27 DIAGNOSIS — R32 Unspecified urinary incontinence: Secondary | ICD-10-CM | POA: Diagnosis not present

## 2020-02-28 ENCOUNTER — Ambulatory Visit: Payer: Medicare HMO | Admitting: Family

## 2020-02-28 ENCOUNTER — Encounter: Payer: Self-pay | Admitting: Family

## 2020-02-28 VITALS — Ht 72.0 in | Wt 225.0 lb

## 2020-02-28 DIAGNOSIS — M79671 Pain in right foot: Secondary | ICD-10-CM | POA: Diagnosis not present

## 2020-02-28 DIAGNOSIS — L97511 Non-pressure chronic ulcer of other part of right foot limited to breakdown of skin: Secondary | ICD-10-CM | POA: Diagnosis not present

## 2020-02-28 MED ORDER — SULFAMETHOXAZOLE-TRIMETHOPRIM 800-160 MG PO TABS: 1.0000 | ORAL_TABLET | Freq: Two times a day (BID) | ORAL | 0 refills | Status: DC

## 2020-02-28 MED ORDER — SILVER SULFADIAZINE 1 % EX CREA: 1.0000 "application " | TOPICAL_CREAM | Freq: Every day | CUTANEOUS | 0 refills | Status: DC

## 2020-02-28 NOTE — Progress Notes (Signed)
Office Visit Note   Patient: Robert Horn           Date of Birth: 27-Aug-1933           MRN: [id number] Visit Date: 02/28/2020              Requested by: [name], MD [address] [place],  [place] [address] PCP: [name], MD  Chief Complaint  Patient presents with   Right Foot - Routine Post Op    08/02/19 right 2nd toe amputation       HPI: The patient is an 86 year old gentleman seen today for evaluation of new ulceration to right foot. Has been in regular shoe wear with custom orthotics. Status post 1st and 2nd toe amputations with ray amputation.   Noticed Friday that he had a new blister to right foot, beneath ball of foot. Wife has noticed an odor and clear drainage.  Assessment & Plan: Visit Diagnoses: No diagnosis found.  Plan: begin daily dial soap cleansing. Silvadene dressing changes. Place on bactrim course. Elevate. Minimize weight bearing. Given a darco shoe to offload the forefoot.   Follow-Up Instructions: Return in about 2 weeks (around 03/13/2020).   Ortho Exam  Patient is alert, oriented, no adenopathy, well-dressed, normal affect, normal respiratory effort. On examination of the right foot there is maceration and blistering beneath the metatarsal heads, primarily the 3rd MT head. This was debrided with a 10 blade knife back to viable tissue. There is 50% granulation and 50% fibrinous exudative tissue in wound bed. Wound is 3 cm in diameter. There is no active drainage. No surrounding erythema. Mild odor. No ascending cellulitis.   Imaging: No results found. No images are attached to the encounter.  Labs: Lab Results  Component Value Date   HGBA1C 7.0 (H) 01/31/2019   HGBA1C 6.4 (H) 12/13/2017   HGBA1C 6.4 (H) 12/09/2016   REPTSTATUS 02/04/2019 FINAL 01/30/2019   CULT  01/30/2019    NO GROWTH 5 DAYS Performed at [facility], [place] [address], [place], [place] [address]      Lab Results  Component Value Date    ALBUMIN 2.3 (L) 02/06/2019   ALBUMIN 2.2 (L) 02/05/2019   ALBUMIN 2.2 (L) 02/04/2019    Lab Results  Component Value Date   MG 2.1 02/06/2019   MG 2.3 02/05/2019   MG 2.3 02/04/2019   No results found for: VD25OH  No results found for: PREALBUMIN CBC EXTENDED Latest Ref Rng & Units 08/02/2019 02/10/2019 02/09/2019  WBC 4.0 - 10.5 K/uL 4.9 6.0 6.6  RBC 4.22 - 5.81 MIL/uL 4.19(L) 2.95(L) 2.92(L)  HGB 13.0 - 17.0 g/dL 12.1(L) 8.7(L) 8.7(L)  HCT 39 - 52 % 38.5(L) 27.6(L) 27.1(L)  PLT 150 - 400 K/uL 158 336 325  NEUTROABS 1.7 - 7.7 K/uL - - -  LYMPHSABS 0.7 - 4.0 K/uL - - -     Body mass index is 30.52 kg/m.  Orders:  No orders of the defined types were placed in this encounter.  No orders of the defined types were placed in this encounter.    Procedures: No procedures performed  Clinical Data: No additional findings.  ROS:  All other systems negative, except as noted in the HPI. Review of Systems  Constitutional: Negative for chills and fever.  Cardiovascular: Negative for leg swelling.  Skin: Positive for color change and wound.    Objective: Vital Signs: Ht 6' (1.829 m)    Wt 225 lb (  102.1 kg)    BMI 30.52 kg/m   Specialty Comments:  No specialty comments available.  PMFS History: Patient Active Problem List   Diagnosis Date Noted   Osteomyelitis of second toe of right foot ([place])    Osteomyelitis of great toe of right foot ([facility])    Severe protein-calorie malnutrition ([place])    Diabetic polyneuropathy associated with type 2 diabetes mellitus ([place])    Cutaneous abscess of right foot    Cellulitis of right foot 01/30/2019   CKD (chronic kidney disease), stage III ([place]) 01/30/2019   Elevated LFTs 01/30/2019   Colon cancer ([place]) 01/30/2019   Anemia of chronic disease 01/30/2019   Acute on chronic combined systolic and diastolic CHF (congestive heart failure) ([place]) 03/09/2018   AKI (acute kidney injury) ([place]) 03/09/2018   Colonic mass  12/10/2017   S/P TAVR (transcatheter aortic valve replacement) 03/03/2017   Severe aortic stenosis 12/15/2016   Essential hypertension 05/09/2013   Hyperlipidemia 05/09/2013   PVC's (premature ventricular contractions) 05/09/2013   Chronic combined systolic and diastolic heart failure ([place]) 05/09/2013   Past Medical History:  Diagnosis Date   Anemia    Cancer ([place]) 2019   colon- colectomy    CHF (congestive heart failure) ([place])    Diabetes mellitus without complication ([place])    Type II   Dyslipidemia 10/27/2015   Dyspnea    w/ exertion    Elevated PSA 10/27/2015   Erectile dysfunction 10/27/2015   Heart murmur    Hypertension 10/27/2015   Hypogonadism male 10/27/2015   Obesity 10/27/2015   Peripheral vascular disease ([place])    Pneumonia 10/27/2015   pt states was 1982   Rotator cuff tear 10/27/2015    Family History  Problem Relation Age of Onset   Diabetes Mother    Heart disease Mother    Pulmonary embolism Father     Past Surgical History:  Procedure Laterality Date   ABDOMINAL AORTOGRAM N/A 02/01/2019   Procedure: ABDOMINAL AORTOGRAM;  Surgeon: Marty Heck, MD;  Location: [place] [facility];  Service: Cardiovascular;  Laterality: N/A;   AMPUTATION Right 02/03/2019   Procedure: RIGHT FOOT 1ST RAY AMPUTATION;  Surgeon: [name], MD;  Location: [place] [place];  Service: Orthopedics;  Laterality: Right;   AMPUTATION Right 08/02/2019   Procedure: RIGHT 2ND TOE AMPUTATION;  Surgeon: [name], MD;  Location: [facility];  Service: Orthopedics;  Laterality: Right;   CARDIAC CATHETERIZATION N/A 11/14/2015   Procedure: Left Heart Cath and Coronary Angiography;  Surgeon: [name], MD;  Location: [place] [facility];  Service: Cardiovascular;  Laterality: N/A;   COLONOSCOPY     LAPAROSCOPIC PARTIAL COLECTOMY  12/15/2017   LAPAROSCOPIC PARTIAL COLECTOMY (N/A Abdomen)   LAPAROSCOPIC PARTIAL COLECTOMY N/A 12/15/2017   Procedure: LAPAROSCOPIC  PARTIAL COLECTOMY;  Surgeon: Erroll Luna, MD;  Location: [place];  Service: General;  Laterality: N/A;   LOWER EXTREMITY ANGIOGRAPHY Right 02/01/2019   Procedure: LOWER EXTREMITY ANGIOGRAPHY;  Surgeon: Marty Heck, MD;  Location: [facility] [facility];  Service: Cardiovascular;  Laterality: Right;   PERIPHERAL VASCULAR INTERVENTION Right 02/01/2019   Procedure: PERIPHERAL VASCULAR INTERVENTION;  Surgeon: Marty Heck, MD;  Location: [facility] [place] [facility];  Service: Cardiovascular;  Laterality: Right;  SFA   TEE WITHOUT CARDIOVERSION N/A 12/15/2016   Procedure: TRANSESOPHAGEAL ECHOCARDIOGRAM (TEE);  Surgeon: Sherren Mocha, MD;  Location: [facility];  Service: Open Heart Surgery;  Laterality: N/A;   TRANSCATHETER AORTIC VALVE REPLACEMENT, TRANSFEMORAL N/A 12/15/2016  Procedure: TRANSCATHETER AORTIC VALVE REPLACEMENT, TRANSFEMORAL;  Surgeon: Sherren Mocha, MD;  Location: [facility];  Service: Open Heart Surgery;  Laterality: N/A;   Social History   Occupational History   Not on file  Tobacco Use   Smoking status: Former Smoker    Types: Cigarettes   Smokeless tobacco: Never Used  Scientific laboratory technician Use: Never used  Substance and Sexual Activity   Alcohol use: Yes    Comment: rarely   Drug use: No   Sexual activity: Not on file

## 2020-03-14 ENCOUNTER — Ambulatory Visit: Payer: Medicare HMO | Admitting: Orthopedic Surgery

## 2020-03-14 ENCOUNTER — Encounter: Payer: Self-pay | Admitting: Orthopedic Surgery

## 2020-03-14 VITALS — Ht 72.0 in | Wt 225.0 lb

## 2020-03-14 DIAGNOSIS — L97511 Non-pressure chronic ulcer of other part of right foot limited to breakdown of skin: Secondary | ICD-10-CM

## 2020-03-18 ENCOUNTER — Other Ambulatory Visit: Payer: Self-pay

## 2020-03-18 ENCOUNTER — Inpatient Hospital Stay: Payer: Medicare HMO

## 2020-03-18 ENCOUNTER — Inpatient Hospital Stay: Payer: Medicare HMO | Attending: Oncology | Admitting: Oncology

## 2020-03-18 VITALS — BP 140/69 | HR 74 | Temp 98.0°F | Resp 18 | Ht 72.0 in | Wt 227.5 lb

## 2020-03-18 DIAGNOSIS — Z85038 Personal history of other malignant neoplasm of large intestine: Secondary | ICD-10-CM | POA: Diagnosis not present

## 2020-03-18 DIAGNOSIS — I10 Essential (primary) hypertension: Secondary | ICD-10-CM | POA: Diagnosis not present

## 2020-03-18 DIAGNOSIS — Z89421 Acquired absence of other right toe(s): Secondary | ICD-10-CM | POA: Diagnosis not present

## 2020-03-18 DIAGNOSIS — Z86010 Personal history of colonic polyps: Secondary | ICD-10-CM | POA: Insufficient documentation

## 2020-03-18 DIAGNOSIS — N400 Enlarged prostate without lower urinary tract symptoms: Secondary | ICD-10-CM | POA: Diagnosis not present

## 2020-03-18 DIAGNOSIS — I509 Heart failure, unspecified: Secondary | ICD-10-CM | POA: Insufficient documentation

## 2020-03-18 DIAGNOSIS — C184 Malignant neoplasm of transverse colon: Secondary | ICD-10-CM | POA: Diagnosis not present

## 2020-03-18 LAB — CEA (IN HOUSE-CHCC): CEA (CHCC-In House): 2.6 ng/mL (ref 0.00–5.00)

## 2020-03-18 NOTE — Progress Notes (Signed)
  [place] OFFICE PROGRESS NOTE   Diagnosis: Colon cancer  INTERVAL HISTORY:   [name] returns as scheduled.  He feels well.  Good appetite.  No difficulty with bowel or bladder function.  He underwent amputation of the righ first and second toes by Dr. Sharol Given.  He has developed a "ulcer "at the right foot.  He is followed by Dr. Sharol Given.  He reports undergoing a colonoscopy last year by Dr. Michail Sermon.  Objective:  Vital signs in last 24 hours:  Blood pressure 140/69, pulse 74, temperature 98 F (36.7 C), temperature source Tympanic, resp. rate 18, height 6' (1.829 m), weight 227 lb 8 oz (103.2 kg), SpO2 100 %.   Lymphatics: No cervical, supraclavicular, axillary, or inguinal nodes Resp: Lungs clear bilaterally Cardio: Regular rate and rhythm GI: No hepatosplenomegaly, no mass, nontender Vascular: No leg edema    Portacath/PICC-without erythema  Lab Results:  Lab Results  Component Value Date   WBC 4.9 08/02/2019   HGB 12.1 (L) 08/02/2019   HCT 38.5 (L) 08/02/2019   MCV 91.9 08/02/2019   PLT 158 08/02/2019   NEUTROABS 9.0 (H) 01/30/2019    CMP  Lab Results  Component Value Date   NA 138 08/02/2019   K 4.3 08/02/2019   CL 109 08/02/2019   CO2 21 (L) 08/02/2019   GLUCOSE 101 (H) 08/02/2019   BUN 24 (H) 08/02/2019   CREATININE 1.51 (H) 08/02/2019   CALCIUM 9.0 08/02/2019   PROT 6.9 02/06/2019   ALBUMIN 2.3 (L) 02/06/2019   AST 23 02/06/2019   ALT 28 02/06/2019   ALKPHOS 106 02/06/2019   BILITOT 0.7 02/06/2019   GFRNONAA 42 (L) 08/02/2019   GFRAA 49 (L) 08/02/2019    Lab Results  Component Value Date   CEA1 2.60 03/18/2020    Medications: I have reviewed the patient's current medications.   Assessment/Plan: 1. Colon cancer, transverse, stage II (T3N0), status post a right and transverse colectomy 12/15/2017 ? 0/25 lymph nodes positive, no lymphovascular or perineural invasion ? MSI stable, no loss of mismatch repair protein  expression ? CTs 12/10/2017-transverse colon wall thickening, 8 mm transverse mesocolon lymph node, no evidence of metastatic disease, stable small bilateral pulmonary nodules ? Colonoscopy 12/09/2017-transverse colon mass, sessile polyps in the descending colon-not removed 2. Polyps on the 12/15/2017 resection specimen-tubular adenomas 3. Enlarged prostate with elevated PSA 4. Status post TAVR 12/15/2016 5. Hypertension 6. Pneumonia 1982 7. History of CHF 8. Right first and second toe amputations secondary to osteomyelitis November 2020 in May 2021    Disposition: Robert Horn is in clinical remission from colon cancer.  The CEA is normal.  He will return for an office visit and CEA in 6 months.  We will follow up on the most recent colonoscopy with Dr. Michail Sermon.  He is followed by Dr. Sharol Given for management of the right toe amputations and right foot ulcer.  He is scheduled for follow-up in the orthopedic clinic next month.    Betsy Coder, MD  03/18/2020  4:47 PM

## 2020-03-19 ENCOUNTER — Encounter: Payer: Self-pay | Admitting: Orthopedic Surgery

## 2020-03-19 ENCOUNTER — Telehealth: Payer: Self-pay

## 2020-03-19 ENCOUNTER — Telehealth: Payer: Self-pay | Admitting: Oncology

## 2020-03-19 NOTE — Progress Notes (Signed)
Office Visit Note   Patient: Robert Horn           Date of Birth: 09/29/1933           MRN: [id number] Visit Date: 03/14/2020              Requested by: [name], MD [address] [place],  [place] [address] PCP: [name], MD  Chief Complaint  Patient presents with  . Right Foot - Follow-up    Follow up foot ulcer       HPI: Patient is 7 months status post right foot second toe amputation.  Patient states he has developed a new ulcer with tenderness patient states he has completed his course of Bactrim DS.  Patient is inquiring whether he should start a new antibiotic.  Assessment & Plan: Visit Diagnoses:  1. Right foot ulcer, limited to breakdown of skin ([place])     Plan: Patient has a Wagner grade 1 ulcer beneath the third and fourth metatarsal heads we will apply a felt relieving donut no indication of infection.  Reviewed pressure offloading.  Follow-Up Instructions: Return in about 4 weeks (around 04/11/2020).   Ortho Exam  Patient is alert, oriented, no adenopathy, well-dressed, normal affect, normal respiratory effort. Examination patient has good pulses he has dorsiflexion the ankle 10 degrees past neutral he is status post the second toe and first ray amputation.  There is a Wagner grade 1 ulcer beneath the third and fourth metatarsal heads this ulcer is flat 10 mm in diameter 0.1 mm deep and has healthy granulation tissue.  A felt relieving donut was placed underneath his orthotics.  Imaging: No results found. No images are attached to the encounter.  Labs: Lab Results  Component Value Date   HGBA1C 7.0 (H) 01/31/2019   HGBA1C 6.4 (H) 12/13/2017   HGBA1C 6.4 (H) 12/09/2016   REPTSTATUS 02/04/2019 FINAL 01/30/2019   CULT  01/30/2019    NO GROWTH 5 DAYS Performed at [facility], [place] [address], [place], [place] [address]      Lab Results  Component Value Date   ALBUMIN 2.3 (L) 02/06/2019   ALBUMIN 2.2 (L) 02/05/2019    ALBUMIN 2.2 (L) 02/04/2019    Lab Results  Component Value Date   MG 2.1 02/06/2019   MG 2.3 02/05/2019   MG 2.3 02/04/2019   No results found for: VD25OH  No results found for: PREALBUMIN CBC EXTENDED Latest Ref Rng & Units 08/02/2019 02/10/2019 02/09/2019  WBC 4.0 - 10.5 K/uL 4.9 6.0 6.6  RBC 4.22 - 5.81 MIL/uL 4.19(L) 2.95(L) 2.92(L)  HGB 13.0 - 17.0 g/dL 12.1(L) 8.7(L) 8.7(L)  HCT 39.0 - 52.0 % 38.5(L) 27.6(L) 27.1(L)  PLT 150 - 400 K/uL 158 336 325  NEUTROABS 1.7 - 7.7 K/uL - - -  LYMPHSABS 0.7 - 4.0 K/uL - - -     Body mass index is 30.52 kg/m.  Orders:  No orders of the defined types were placed in this encounter.  No orders of the defined types were placed in this encounter.    Procedures: No procedures performed  Clinical Data: No additional findings.  ROS:  All other systems negative, except as noted in the HPI. Review of Systems  Objective: Vital Signs: Ht 6' (1.829 m)   Wt 225 lb (102.1 kg)   BMI 30.52 kg/m   Specialty Comments:  No specialty comments available.  PMFS History: Patient Active Problem List   Diagnosis Date Noted  .  Right foot ulcer, limited to breakdown of skin ([place]) 02/28/2020  . Osteomyelitis of second toe of right foot ([place])   . Osteomyelitis of great toe of right foot ([place])   . Severe protein-calorie malnutrition ([place])   . Diabetic polyneuropathy associated with type 2 diabetes mellitus ([place])   . Cutaneous abscess of right foot   . Cellulitis of right foot 01/30/2019  . CKD (chronic kidney disease), stage III ([place]) 01/30/2019  . Elevated LFTs 01/30/2019  . Colon cancer ([place]) 01/30/2019  . Anemia of chronic disease 01/30/2019  . Acute on chronic combined systolic and diastolic CHF (congestive heart failure) ([place]) 03/09/2018  . AKI (acute kidney injury) ([place]) 03/09/2018  . Colonic mass 12/10/2017  . S/P TAVR (transcatheter aortic valve replacement) 03/03/2017  . Severe aortic stenosis 12/15/2016  . Essential  hypertension 05/09/2013  . Hyperlipidemia 05/09/2013  . PVC's (premature ventricular contractions) 05/09/2013  . Chronic combined systolic and diastolic heart failure ([place]) 05/09/2013   Past Medical History:  Diagnosis Date  . Anemia   . Cancer [facility]) 2019   colon- colectomy   . CHF (congestive heart failure) ([place])   . Diabetes mellitus without complication ([place])    Type II  . Dyslipidemia 10/27/2015  . Dyspnea    w/ exertion   . Elevated PSA 10/27/2015  . Erectile dysfunction 10/27/2015  . Heart murmur   . Hypertension 10/27/2015  . Hypogonadism male 10/27/2015  . Obesity 10/27/2015  . Peripheral vascular disease ([place])   . Pneumonia 10/27/2015   pt states was 1982  . Rotator cuff tear 10/27/2015    Family History  Problem Relation Age of Onset  . Diabetes Mother   . Heart disease Mother   . Pulmonary embolism Father     Past Surgical History:  Procedure Laterality Date  . ABDOMINAL AORTOGRAM N/A 02/01/2019   Procedure: ABDOMINAL AORTOGRAM;  Surgeon: Marty Heck, MD;  Location: [facility];  Service: Cardiovascular;  Laterality: N/A;  . AMPUTATION Right 02/03/2019   Procedure: RIGHT FOOT 1ST RAY AMPUTATION;  Surgeon: [name], MD;  Location: [facility];  Service: Orthopedics;  Laterality: Right;  . AMPUTATION Right 08/02/2019   Procedure: RIGHT 2ND TOE AMPUTATION;  Surgeon: [name], MD;  Location: [facility];  Service: Orthopedics;  Laterality: Right;  . CARDIAC CATHETERIZATION N/A 11/14/2015   Procedure: Left Heart Cath and Coronary Angiography;  Surgeon: [name], MD;  Location: [facility];  Service: Cardiovascular;  Laterality: N/A;  . COLONOSCOPY    . LAPAROSCOPIC PARTIAL COLECTOMY  12/15/2017   LAPAROSCOPIC PARTIAL COLECTOMY (N/A Abdomen)  . LAPAROSCOPIC PARTIAL COLECTOMY N/A 12/15/2017   Procedure: LAPAROSCOPIC PARTIAL COLECTOMY;  Surgeon: Erroll Luna, MD;  Location: [facility];  Service: General;  Laterality: N/A;  . LOWER EXTREMITY  ANGIOGRAPHY Right 02/01/2019   Procedure: LOWER EXTREMITY ANGIOGRAPHY;  Surgeon: Marty Heck, MD;  Location: [facility];  Service: Cardiovascular;  Laterality: Right;  . PERIPHERAL VASCULAR INTERVENTION Right 02/01/2019   Procedure: PERIPHERAL VASCULAR INTERVENTION;  Surgeon: Marty Heck, MD;  Location: [facility];  Service: Cardiovascular;  Laterality: Right;  SFA  . TEE WITHOUT CARDIOVERSION N/A 12/15/2016   Procedure: TRANSESOPHAGEAL ECHOCARDIOGRAM (TEE);  Surgeon: Sherren Mocha, MD;  Location: [facility];  Service: Open Heart Surgery;  Laterality: N/A;  . TRANSCATHETER AORTIC VALVE REPLACEMENT, TRANSFEMORAL N/A 12/15/2016   Procedure: TRANSCATHETER AORTIC VALVE REPLACEMENT, TRANSFEMORAL;  Surgeon: Sherren Mocha, MD;  Location: [facility];  Service: Open Heart  Surgery;  Laterality: N/A;   Social History   Occupational History  . Not on file  Tobacco Use  . Smoking status: Former Smoker    Types: Cigarettes  . Smokeless tobacco: Never Used  Vaping Use  . Vaping Use: Never used  Substance and Sexual Activity  . Alcohol use: Yes    Comment: rarely  . Drug use: No  . Sexual activity: Not on file

## 2020-03-19 NOTE — Telephone Encounter (Signed)
Requested last colonoscopy from [facility] be faxed to [place].  Left voice message for their medical records department.

## 2020-03-19 NOTE — Telephone Encounter (Signed)
Scheduled per 12/20 los, patient has been called and notified. 

## 2020-03-21 ENCOUNTER — Other Ambulatory Visit: Payer: Self-pay | Admitting: Interventional Cardiology

## 2020-04-06 ENCOUNTER — Other Ambulatory Visit: Payer: Self-pay | Admitting: Interventional Cardiology

## 2020-04-11 ENCOUNTER — Ambulatory Visit: Payer: Medicare HMO | Admitting: Orthopedic Surgery

## 2020-04-11 DIAGNOSIS — L97511 Non-pressure chronic ulcer of other part of right foot limited to breakdown of skin: Secondary | ICD-10-CM | POA: Diagnosis not present

## 2020-04-12 ENCOUNTER — Encounter: Payer: Self-pay | Admitting: Orthopedic Surgery

## 2020-04-12 NOTE — Progress Notes (Signed)
Office Visit Note   Patient: Robert Horn           Date of Birth: [birth year]           MRN: [id number] Visit Date: 04/11/2020              Requested by: [name], MD [address] [place],  [place] [address] PCP: [name], MD  Chief Complaint  Patient presents with  . Right Foot - Follow-up      HPI: Patient is an 87 year old gentleman who presents complaining of a recurrent ulcer beneath the second and third metatarsal heads right foot status post first ray amputation.  Patient has extra-depth shoes custom orthotics and a felt relieving donut to unload the metatarsal heads.  Assessment & Plan: Visit Diagnoses:  1. Right foot ulcer, limited to breakdown of skin ([place])     Plan: Ulcer was debrided of skin and soft tissue patient will continue with his protective shoe wear reevaluate in 4 weeks for ulcer progression.  Follow-Up Instructions: Return in about 4 weeks (around 05/09/2020).   Ortho Exam  Patient is alert, oriented, no adenopathy, well-dressed, normal affect, normal respiratory effort. Examination patient has good ankle dorsiflexion about 20 degrees past neutral he has a Wagner grade 1 ulcer beneath the second and third metatarsal heads.  The ray amputation has healed well.  After informed consent a 10 blade knife was used to debride the skin and soft tissue back to healthy bleeding viable granulation tissue the ulcer was 5 mm in diameter prior to debridement after debridement the ulcer was 3 cm in diameter 3 mm deep there is no exposed bone or tendon Silver nitrate was used for hemostasis a Band-Aid was applied.  Imaging: No results found. No images are attached to the encounter.  Labs: Lab Results  Component Value Date   HGBA1C 7.0 (H) 01/31/2019   HGBA1C 6.4 (H) 12/13/2017   HGBA1C 6.4 (H) 12/09/2016   REPTSTATUS 02/04/2019 FINAL 01/30/2019   CULT  01/30/2019    NO GROWTH 5 DAYS Performed at [facility], [place] [address], [place], [place] [address]      Lab Results  Component Value Date   ALBUMIN 2.3 (L) 02/06/2019   ALBUMIN 2.2 (L) 02/05/2019   ALBUMIN 2.2 (L) 02/04/2019    Lab Results  Component Value Date   MG 2.1 02/06/2019   MG 2.3 02/05/2019   MG 2.3 02/04/2019   No results found for: VD25OH  No results found for: PREALBUMIN CBC EXTENDED Latest Ref Rng & Units 08/02/2019 02/10/2019 02/09/2019  WBC 4.0 - 10.5 K/uL 4.9 6.0 6.6  RBC 4.22 - 5.81 MIL/uL 4.19(L) 2.95(L) 2.92(L)  HGB 13.0 - 17.0 g/dL 12.1(L) 8.7(L) 8.7(L)  HCT 39.0 - 52.0 % 38.5(L) 27.6(L) 27.1(L)  PLT 150 - 400 K/uL 158 336 325  NEUTROABS 1.7 - 7.7 K/uL - - -  LYMPHSABS 0.7 - 4.0 K/uL - - -     There is no height or weight on file to calculate BMI.  Orders:  No orders of the defined types were placed in this encounter.  No orders of the defined types were placed in this encounter.    Procedures: No procedures performed  Clinical Data: No additional findings.  ROS:  All other systems negative, except as noted in the HPI. Review of Systems  Objective: Vital Signs: There were no vitals taken for this visit.  Specialty Comments:  No specialty comments available.  [place]  History: Patient Active Problem List   Diagnosis Date Noted  . Right foot ulcer, limited to breakdown of skin ([place]) 02/28/2020  . Osteomyelitis of second toe of right foot ([place])   . Osteomyelitis of great toe of right foot ([place])   . Severe protein-calorie malnutrition ([place])   . Diabetic polyneuropathy associated with type 2 diabetes mellitus ([place])   . Cutaneous abscess of right foot   . Cellulitis of right foot 01/30/2019  . CKD (chronic kidney disease), stage III ([place]) 01/30/2019  . Elevated LFTs 01/30/2019  . Colon cancer ([place]) 01/30/2019  . Anemia of chronic disease 01/30/2019  . Acute on chronic combined systolic and diastolic CHF (congestive heart failure) ([place]) 03/09/2018  . AKI (acute kidney injury) ([place]) 03/09/2018  . Colonic mass  12/10/2017  . S/P TAVR (transcatheter aortic valve replacement) 03/03/2017  . Severe aortic stenosis 12/15/2016  . Essential hypertension 05/09/2013  . Hyperlipidemia 05/09/2013  . PVC's (premature ventricular contractions) 05/09/2013  . Chronic combined systolic and diastolic heart failure ([place]) 05/09/2013   Past Medical History:  Diagnosis Date  . Anemia   . Cancer [facility]) 2019   colon- colectomy   . CHF (congestive heart failure) ([place])   . Diabetes mellitus without complication ([place])    Type II  . Dyslipidemia 10/27/2015  . Dyspnea    w/ exertion   . Elevated PSA 10/27/2015  . Erectile dysfunction 10/27/2015  . Heart murmur   . Hypertension 10/27/2015  . Hypogonadism male 10/27/2015  . Obesity 10/27/2015  . Peripheral vascular disease ([place])   . Pneumonia 10/27/2015   pt states was 1982  . Rotator cuff tear 10/27/2015    Family History  Problem Relation Age of Onset  . Diabetes Mother   . Heart disease Mother   . Pulmonary embolism Father     Past Surgical History:  Procedure Laterality Date  . ABDOMINAL AORTOGRAM N/A 02/01/2019   Procedure: ABDOMINAL AORTOGRAM;  Surgeon: Marty Heck, MD;  Location: [facility] [facility];  Service: Cardiovascular;  Laterality: N/A;  . AMPUTATION Right 02/03/2019   Procedure: RIGHT FOOT 1ST RAY AMPUTATION;  Surgeon: [name], MD;  Location: [place];  Service: Orthopedics;  Laterality: Right;  . AMPUTATION Right 08/02/2019   Procedure: RIGHT 2ND TOE AMPUTATION;  Surgeon: [name], MD;  Location: [facility];  Service: Orthopedics;  Laterality: Right;  . CARDIAC CATHETERIZATION N/A 11/14/2015   Procedure: Left Heart Cath and Coronary Angiography;  Surgeon: [name], MD;  Location: [facility];  Service: Cardiovascular;  Laterality: N/A;  . COLONOSCOPY    . LAPAROSCOPIC PARTIAL COLECTOMY  12/15/2017   LAPAROSCOPIC PARTIAL COLECTOMY (N/A Abdomen)  . LAPAROSCOPIC PARTIAL COLECTOMY N/A 12/15/2017   Procedure: LAPAROSCOPIC  PARTIAL COLECTOMY;  Surgeon: Erroll Luna, MD;  Location: [facility];  Service: General;  Laterality: N/A;  . LOWER EXTREMITY ANGIOGRAPHY Right 02/01/2019   Procedure: LOWER EXTREMITY ANGIOGRAPHY;  Surgeon: Marty Heck, MD;  Location: [facility];  Service: Cardiovascular;  Laterality: Right;  . PERIPHERAL VASCULAR INTERVENTION Right 02/01/2019   Procedure: PERIPHERAL VASCULAR INTERVENTION;  Surgeon: Marty Heck, MD;  Location: [facility];  Service: Cardiovascular;  Laterality: Right;  SFA  . TEE WITHOUT CARDIOVERSION N/A 12/15/2016   Procedure: TRANSESOPHAGEAL ECHOCARDIOGRAM (TEE);  Surgeon: Sherren Mocha, MD;  Location: [facility];  Service: Open Heart Surgery;  Laterality: N/A;  . TRANSCATHETER AORTIC VALVE REPLACEMENT, TRANSFEMORAL N/A 12/15/2016   Procedure: TRANSCATHETER AORTIC VALVE REPLACEMENT, TRANSFEMORAL;  Surgeon: Sherren Mocha, MD;  Location: [facility];  Service: Open Heart Surgery;  Laterality: N/A;   Social History   Occupational History  . Not on file  Tobacco Use  . Smoking status: Former Smoker    Types: Cigarettes  . Smokeless tobacco: Never Used  Vaping Use  . Vaping Use: Never used  Substance and Sexual Activity  . Alcohol use: Yes    Comment: rarely  . Drug use: No  . Sexual activity: Not on file

## 2020-05-09 ENCOUNTER — Ambulatory Visit: Payer: Medicare HMO | Admitting: Orthopedic Surgery

## 2020-05-09 DIAGNOSIS — L97511 Non-pressure chronic ulcer of other part of right foot limited to breakdown of skin: Secondary | ICD-10-CM | POA: Diagnosis not present

## 2020-05-14 DIAGNOSIS — E7800 Pure hypercholesterolemia, unspecified: Secondary | ICD-10-CM | POA: Diagnosis not present

## 2020-05-14 DIAGNOSIS — I5040 Unspecified combined systolic (congestive) and diastolic (congestive) heart failure: Secondary | ICD-10-CM | POA: Diagnosis not present

## 2020-05-14 DIAGNOSIS — C184 Malignant neoplasm of transverse colon: Secondary | ICD-10-CM | POA: Diagnosis not present

## 2020-05-14 DIAGNOSIS — L97518 Non-pressure chronic ulcer of other part of right foot with other specified severity: Secondary | ICD-10-CM | POA: Diagnosis not present

## 2020-05-14 DIAGNOSIS — E1169 Type 2 diabetes mellitus with other specified complication: Secondary | ICD-10-CM | POA: Diagnosis not present

## 2020-05-14 DIAGNOSIS — I739 Peripheral vascular disease, unspecified: Secondary | ICD-10-CM | POA: Diagnosis not present

## 2020-05-14 DIAGNOSIS — I10 Essential (primary) hypertension: Secondary | ICD-10-CM | POA: Diagnosis not present

## 2020-05-14 DIAGNOSIS — C182 Malignant neoplasm of ascending colon: Secondary | ICD-10-CM | POA: Diagnosis not present

## 2020-05-14 DIAGNOSIS — D509 Iron deficiency anemia, unspecified: Secondary | ICD-10-CM | POA: Diagnosis not present

## 2020-05-14 DIAGNOSIS — Z85038 Personal history of other malignant neoplasm of large intestine: Secondary | ICD-10-CM | POA: Diagnosis not present

## 2020-05-14 DIAGNOSIS — N401 Enlarged prostate with lower urinary tract symptoms: Secondary | ICD-10-CM | POA: Diagnosis not present

## 2020-05-14 DIAGNOSIS — Z7984 Long term (current) use of oral hypoglycemic drugs: Secondary | ICD-10-CM | POA: Diagnosis not present

## 2020-05-14 DIAGNOSIS — E1159 Type 2 diabetes mellitus with other circulatory complications: Secondary | ICD-10-CM | POA: Diagnosis not present

## 2020-05-14 DIAGNOSIS — E1165 Type 2 diabetes mellitus with hyperglycemia: Secondary | ICD-10-CM | POA: Diagnosis not present

## 2020-05-16 ENCOUNTER — Ambulatory Visit (HOSPITAL_COMMUNITY)
Admission: RE | Admit: 2020-05-16 | Discharge: 2020-05-16 | Disposition: A | Discharge status: Home / Self Care | Payer: Medicare HMO | Source: Ambulatory Visit | Attending: Vascular Surgery | Admitting: Vascular Surgery

## 2020-05-16 ENCOUNTER — Ambulatory Visit (INDEPENDENT_AMBULATORY_CARE_PROVIDER_SITE_OTHER)
Admission: RE | Admit: 2020-05-16 | Discharge: 2020-05-16 | Disposition: A | Discharge status: Home / Self Care | Payer: Medicare HMO | Source: Ambulatory Visit | Attending: Vascular Surgery | Admitting: Vascular Surgery

## 2020-05-16 ENCOUNTER — Ambulatory Visit: Payer: Medicare HMO | Admitting: Physician Assistant

## 2020-05-16 ENCOUNTER — Encounter: Payer: Self-pay | Admitting: Physician Assistant

## 2020-05-16 ENCOUNTER — Other Ambulatory Visit: Payer: Self-pay

## 2020-05-16 VITALS — BP 132/76 | HR 49 | Temp 97.9°F | Resp 20 | Ht 72.0 in | Wt 227.5 lb

## 2020-05-16 DIAGNOSIS — I739 Peripheral vascular disease, unspecified: Secondary | ICD-10-CM

## 2020-05-16 NOTE — Progress Notes (Signed)
Office Note     CC:  follow up Requesting Provider:  [name], MD  HPI: Robert Horn is a 87 y.o. (08-02-1933) male who presents for follow up of peripheral artery disease. He has had a previous right foot first ray amputation that has healed s/p aortogram on 02/01/2019 with right SFA angioplasty with stent placement by Dr. Carlis Abbott. At time of his last visit in April of 2021 he had a new right 2nd toe ulceration. This was felt to be superficial and he had patent stents on duplex with a stable ABI. His blood flow was felt to be sufficient to heal his wound. He was advised on protecting his feet and was instructed to keep follow up with Dr. Sharol Given. Dr. Sharol Given subsequently amputated his 2nd toe in May of 2021 and the wound healed very nicely. He states though that about 1 month ago he developed a pressure wound on the bottom of the right foot. Dr. Sharol Given has been managing the wound. He has been instructed to not bear weight and to also elevate his legs because of some recent swelling in the leg. He additionally wears 15-20 mmHg compression stockings prescribed by Dr. Sharol Given. He is currently using custom orthotics and also has a rolling single leg walker to keep weight bearing off of the right foot. He does not have any claudication or rest pain.   The pt is on a statin for cholesterol management.  The pt is on a daily aspirin.   Other AC: Plavix The pt is on CCB, diuretic for hypertension.   The pt is diabetic.  Tobacco hx:  Former smoker  Past Medical History:  Diagnosis Date  . Anemia   . Cancer [facility]) 2019   colon- colectomy   . CHF (congestive heart failure) ([place])   . Diabetes mellitus without complication ([place])    Type II  . Dyslipidemia 10/27/2015  . Dyspnea    w/ exertion   . Elevated PSA 10/27/2015  . Erectile dysfunction 10/27/2015  . Heart murmur   . Hypertension 10/27/2015  . Hypogonadism male 10/27/2015  . Obesity 10/27/2015  . Peripheral vascular disease ([place])   . Pneumonia  10/27/2015   pt states was 1982  . Rotator cuff tear 10/27/2015    Past Surgical History:  Procedure Laterality Date  . ABDOMINAL AORTOGRAM N/A 02/01/2019   Procedure: ABDOMINAL AORTOGRAM;  Surgeon: Marty Heck, MD;  Location: [place] [facility];  Service: Cardiovascular;  Laterality: N/A;  . AMPUTATION Right 02/03/2019   Procedure: RIGHT FOOT 1ST RAY AMPUTATION;  Surgeon: [name], MD;  Location: [place];  Service: Orthopedics;  Laterality: Right;  . AMPUTATION Right 08/02/2019   Procedure: RIGHT 2ND TOE AMPUTATION;  Surgeon: [name], MD;  Location: [place];  Service: Orthopedics;  Laterality: Right;  . CARDIAC CATHETERIZATION N/A 11/14/2015   Procedure: Left Heart Cath and Coronary Angiography;  Surgeon: [name], MD;  Location: [place] [facility];  Service: Cardiovascular;  Laterality: N/A;  . COLONOSCOPY    . LAPAROSCOPIC PARTIAL COLECTOMY  12/15/2017   LAPAROSCOPIC PARTIAL COLECTOMY (N/A Abdomen)  . LAPAROSCOPIC PARTIAL COLECTOMY N/A 12/15/2017   Procedure: LAPAROSCOPIC PARTIAL COLECTOMY;  Surgeon: Erroll Luna, MD;  Location: [place];  Service: General;  Laterality: N/A;  . LOWER EXTREMITY ANGIOGRAPHY Right 02/01/2019   Procedure: LOWER EXTREMITY ANGIOGRAPHY;  Surgeon: Marty Heck, MD;  Location: [place] [facility];  Service: Cardiovascular;  Laterality: Right;  . PERIPHERAL VASCULAR INTERVENTION Right 02/01/2019  Procedure: PERIPHERAL VASCULAR INTERVENTION;  Surgeon: Marty Heck, MD;  Location: [place] [facility];  Service: Cardiovascular;  Laterality: Right;  SFA  . TEE WITHOUT CARDIOVERSION N/A 12/15/2016   Procedure: TRANSESOPHAGEAL ECHOCARDIOGRAM (TEE);  Surgeon: Sherren Mocha, MD;  Location: [place];  Service: Open Heart Surgery;  Laterality: N/A;  . TRANSCATHETER AORTIC VALVE REPLACEMENT, TRANSFEMORAL N/A 12/15/2016   Procedure: TRANSCATHETER AORTIC VALVE REPLACEMENT, TRANSFEMORAL;  Surgeon: Sherren Mocha, MD;  Location: [place];  Service: Open  Heart Surgery;  Laterality: N/A;    Social History   Socioeconomic History  . Marital status: Single    Spouse name: Not on file  . Number of children: Not on file  . Years of education: Not on file  . Highest education level: Not on file  Occupational History  . Not on file  Tobacco Use  . Smoking status: Former Smoker    Types: Cigarettes  . Smokeless tobacco: Never Used  Vaping Use  . Vaping Use: Never used  Substance and Sexual Activity  . Alcohol use: Yes    Comment: rarely  . Drug use: No  . Sexual activity: Not on file  Other Topics Concern  . Not on file  Social History Narrative  . Not on file   Social Determinants of Health   Financial Resource Strain: Not on file  Food Insecurity: Not on file  Transportation Needs: Not on file  Physical Activity: Not on file  Stress: Not on file  Social Connections: Not on file  Intimate Partner Violence: Not on file    Family History  Problem Relation Age of Onset  . Diabetes Mother   . Heart disease Mother   . Pulmonary embolism Father     Current Outpatient Medications  Medication Sig Dispense Refill  . amLODipine (NORVASC) 10 MG tablet Take 1 tablet (10 mg total) by mouth daily. 90 tablet 2  . aspirin 81 MG EC tablet Take 81 mg by mouth daily. Swallow whole.    . carvedilol (COREG) 3.125 MG tablet TAKE 1 TABLET (3.125 MG TOTAL) BY MOUTH 2 (TWO) TIMES DAILY. 180 tablet 3  . Cholecalciferol (VITAMIN D) 50 MCG (2000 UT) tablet 1 tablet    . clopidogrel (PLAVIX) 75 MG tablet TAKE 1 TABLET (75 MG TOTAL) BY MOUTH DAILY WITH BREAKFAST. 90 tablet 3  . ENTRESTO 49-51 MG TAKE 1 TABLET BY MOUTH TWICE A DAY 180 tablet 3  . ferrous sulfate 325 (65 FE) MG tablet Take 325 mg by mouth daily with breakfast.    . furosemide (LASIX) 40 MG tablet TAKE 1.5 TABLETS BY MOUTH DAILY. 135 tablet 1  . gabapentin (NEURONTIN) 300 MG capsule 1 capsule    . JARDIANCE 25 MG TABS tablet Take 25 mg by mouth daily.    . Multiple Vitamin  (MULTIVITAMIN WITH MINERALS) TABS tablet Take 1 tablet by mouth daily. 30 tablet 1  . naproxen sodium (ALEVE) 220 MG tablet Take 220 mg by mouth daily as needed (pain).    Marland Kitchen omega-3 acid ethyl esters (LOVAZA) 1 g capsule TAKE 1 CAPSULE BY MOUTH EVERY DAY 90 capsule 3  . oxyCODONE-acetaminophen (PERCOCET) 5-325 MG tablet Take 1 tablet by mouth every 4 (four) hours as needed. 30 tablet 0  . rosuvastatin (CRESTOR) 20 MG tablet Take 20 mg by mouth daily.    . silver sulfADIAZINE (SILVADENE) 1 % cream Apply 1 application topically daily. 50 g 0  . VITAMIN B COMPLEX-C PO Take 1 tablet by mouth daily.  No current facility-administered medications for this visit.    No Known Allergies   REVIEW OF SYSTEMS:  [X]  denotes positive finding, [ ]  denotes negative finding Cardiac  Comments:  Chest pain or chest pressure:    Shortness of breath upon exertion:    Short of breath when lying flat:    Irregular heart rhythm:        Vascular    Pain in calf, thigh, or hip brought on by ambulation:    Pain in feet at night that wakes you up from your sleep:     Blood clot in your veins:    Leg swelling:  X       Pulmonary    Oxygen at home:    Productive cough:     Wheezing:         Neurologic    Sudden weakness in arms or legs:     Sudden numbness in arms or legs:     Sudden onset of difficulty speaking or slurred speech:    Temporary loss of vision in one eye:     Problems with dizziness:         Gastrointestinal    Blood in stool:     Vomited blood:         Genitourinary    Burning when urinating:     Blood in urine:        Psychiatric    Major depression:         Hematologic    Bleeding problems:    Problems with blood clotting too easily:        Skin    Rashes or ulcers:        Constitutional    Fever or chills:      PHYSICAL EXAMINATION:  Vitals:   05/16/20 1434  BP: 132/76  Pulse: (!) 49  Resp: 20  Temp: 97.9 F (36.6 C)  TempSrc: Temporal  SpO2: 98%   Weight: 227 lb 8 oz (103.2 kg)  Height: 6' (1.829 m)    General:  WDWN in NAD; vital signs documented above Gait: normal. Using rolling leg walker to offload weight bearing on right foot HENT: WNL, normocephalic Pulmonary: normal non-labored breathing , without wheezing Cardiac: regular HR, without  Murmurs without carotid bruit Abdomen: soft, NT, no masses Vascular Exam/Pulses:  Right Left  Radial 2+ (normal) 2+ (normal)  Femoral 2+ (normal) 2+ (normal)  Popliteal Not palpable Not palpable  DP Not palpable Not palpable  PT 2+ (normal) 2+ (normal)   Extremities: without ischemic changes, without Gangrene , without cellulitis; with open wound on plantar aspect of right foot as shown below. Bilateral feet warm and well perfused. 1st and 2nd toe amputation sites well healed.  Pressure ulceration on plantar aspect of right foot Musculoskeletal: no muscle wasting or atrophy  Neurologic: A&O X 3;  No focal weakness or paresthesias are detected Psychiatric:  The pt has Normal affect.   Non-Invasive Vascular Imaging:   +-------+-----------+-----------+------------+------------+  ABI/TBIToday's ABIToday's TBIPrevious ABIPrevious TBI  +-------+-----------+-----------+------------+------------+  Right 1.07    amputated 1.16    amputated    +-------+-----------+-----------+------------+------------+  Left  0.87    0.70    0.70    0.53      +-------+-----------+-----------+------------+------------+   Right lower extremity arterial duplex showed Patent stent with no visualized stenosis. Probable tibial occlusive disease. Biphasic and triphasic flow throughout until level of AT and peroneal with monophasic flow. Dominant runoff via the posterior tibial artery which had biphasic  flow.  ASSESSMENT/PLAN:: 87 y.o. male here for follow up for peripheral artery disease.  Right lower extremity arterial duplex shows patency of the right SFA stent with  triphasic and biphasic flow throughout.ABI showing monophasic PT flow which was previously biphasic. However, duplex shows biphasic flow in the PT. Known AT occlusion and diseased peroneal. He has sufficient blood flow to heal this. Discussed importance of not weightbearing on this area and also keeping feet protected. - He will keep follow up with Dr. Sharol Given on 2/24 for wound care - He knows to follow up sooner if he has any new or worsening symptoms - He will follow up with [place] in 3 months with repeat duplex and ABIs   Karoline Caldwell, PA-C [facility] [phone]  Clinic MD:  Dr. Oneida Alar Dr. Scot Dock

## 2020-05-17 ENCOUNTER — Encounter: Payer: Self-pay | Admitting: Orthopedic Surgery

## 2020-05-17 NOTE — Progress Notes (Signed)
Office Visit Note   Patient: Robert Horn           Date of Birth: 07-11-1933           MRN: [id number] Visit Date: 05/09/2020              Requested by: [name], MD [address] [place],  [place] [address] PCP: [name], MD  Chief Complaint  Patient presents with  . Right Foot - Follow-up      HPI: Patient is an 87 year old gentleman who is status post partial foot amputation right foot who was developed a ulcer beneath the third and fourth metatarsal heads.  He is currently wearing orthotics a felt relieving donut.  Assessment & Plan: Visit Diagnoses:  1. Right foot ulcer, limited to breakdown of skin ([place])     Plan: Continue with the felt donut and orthotics recommended a kneeling scooter to fully offload the ulcer.  Follow-Up Instructions: Return in about 2 weeks (around 05/23/2020).   Ortho Exam  Patient is alert, oriented, no adenopathy, well-dressed, normal affect, normal respiratory effort. On examination patient has a Wagner grade 1 ulcer beneath the third and fourth metatarsal heads of the right foot.  The ulcer is 10 mm in diameter prior to debridement after debridement the ulcer is 3 cm in diameter and 3 mm deep with healthy granulation tissue this was touched with silver nitrate and a dry dressing was applied  Imaging: No results found. No images are attached to the encounter.  Labs: Lab Results  Component Value Date   HGBA1C 7.0 (H) 01/31/2019   HGBA1C 6.4 (H) 12/13/2017   HGBA1C 6.4 (H) 12/09/2016   REPTSTATUS 02/04/2019 FINAL 01/30/2019   CULT  01/30/2019    NO GROWTH 5 DAYS Performed at [facility], [place] [address] [place], [place] [address]      Lab Results  Component Value Date   ALBUMIN 2.3 (L) 02/06/2019   ALBUMIN 2.2 (L) 02/05/2019   ALBUMIN 2.2 (L) 02/04/2019    Lab Results  Component Value Date   MG 2.1 02/06/2019   MG 2.3 02/05/2019   MG 2.3 02/04/2019   No results found for: VD25OH  No  results found for: PREALBUMIN CBC EXTENDED Latest Ref Rng & Units 08/02/2019 02/10/2019 02/09/2019  WBC 4.0 - 10.5 K/uL 4.9 6.0 6.6  RBC 4.22 - 5.81 MIL/uL 4.19(L) 2.95(L) 2.92(L)  HGB 13.0 - 17.0 g/dL 12.1(L) 8.7(L) 8.7(L)  HCT 39.0 - 52.0 % 38.5(L) 27.6(L) 27.1(L)  PLT 150 - 400 K/uL 158 336 325  NEUTROABS 1.7 - 7.7 K/uL - - -  LYMPHSABS 0.7 - 4.0 K/uL - - -     There is no height or weight on file to calculate BMI.  Orders:  No orders of the defined types were placed in this encounter.  No orders of the defined types were placed in this encounter.    Procedures: No procedures performed  Clinical Data: No additional findings.  ROS:  All other systems negative, except as noted in the HPI. Review of Systems  Objective: Vital Signs: There were no vitals taken for this visit.  Specialty Comments:  No specialty comments available.  PMFS History: Patient Active Problem List   Diagnosis Date Noted  . Right foot ulcer, limited to breakdown of skin ([place]) 02/28/2020  . Osteomyelitis of second toe of right foot ([place])   . Osteomyelitis of great toe of right foot ([place])   . Severe protein-calorie malnutrition (  [place])   . Diabetic polyneuropathy associated with type 2 diabetes mellitus ([place])   . Cutaneous abscess of right foot   . Cellulitis of right foot 01/30/2019  . CKD (chronic kidney disease), stage III ([place]) 01/30/2019  . Elevated LFTs 01/30/2019  . Colon cancer ([place]) 01/30/2019  . Anemia of chronic disease 01/30/2019  . Acute on chronic combined systolic and diastolic CHF (congestive heart failure) ([place]) 03/09/2018  . AKI (acute kidney injury) ([place]) 03/09/2018  . Colonic mass 12/10/2017  . S/P TAVR (transcatheter aortic valve replacement) 03/03/2017  . Severe aortic stenosis 12/15/2016  . Essential hypertension 05/09/2013  . Hyperlipidemia 05/09/2013  . PVC's (premature ventricular contractions) 05/09/2013  . Chronic combined systolic and diastolic heart failure ([place])  05/09/2013   Past Medical History:  Diagnosis Date  . Anemia   . Cancer [facility]) 2019   colon- colectomy   . CHF (congestive heart failure) ([place])   . Diabetes mellitus without complication ([place])    Type II  . Dyslipidemia 10/27/2015  . Dyspnea    w/ exertion   . Elevated PSA 10/27/2015  . Erectile dysfunction 10/27/2015  . Heart murmur   . Hypertension 10/27/2015  . Hypogonadism male 10/27/2015  . Obesity 10/27/2015  . Peripheral vascular disease ([place])   . Pneumonia 10/27/2015   pt states was 1982  . Rotator cuff tear 10/27/2015    Family History  Problem Relation Age of Onset  . Diabetes Mother   . Heart disease Mother   . Pulmonary embolism Father     Past Surgical History:  Procedure Laterality Date  . ABDOMINAL AORTOGRAM N/A 02/01/2019   Procedure: ABDOMINAL AORTOGRAM;  Surgeon: Marty Heck, MD;  Location: [place] [facility];  Service: Cardiovascular;  Laterality: N/A;  . AMPUTATION Right 02/03/2019   Procedure: RIGHT FOOT 1ST RAY AMPUTATION;  Surgeon: [name], MD;  Location: [place];  Service: Orthopedics;  Laterality: Right;  . AMPUTATION Right 08/02/2019   Procedure: RIGHT 2ND TOE AMPUTATION;  Surgeon: [name], MD;  Location: [place];  Service: Orthopedics;  Laterality: Right;  . CARDIAC CATHETERIZATION N/A 11/14/2015   Procedure: Left Heart Cath and Coronary Angiography;  Surgeon: [name], MD;  Location: [place] [facility];  Service: Cardiovascular;  Laterality: N/A;  . COLONOSCOPY    . LAPAROSCOPIC PARTIAL COLECTOMY  12/15/2017   LAPAROSCOPIC PARTIAL COLECTOMY (N/A Abdomen)  . LAPAROSCOPIC PARTIAL COLECTOMY N/A 12/15/2017   Procedure: LAPAROSCOPIC PARTIAL COLECTOMY;  Surgeon: Erroll Luna, MD;  Location: [place];  Service: General;  Laterality: N/A;  . LOWER EXTREMITY ANGIOGRAPHY Right 02/01/2019   Procedure: LOWER EXTREMITY ANGIOGRAPHY;  Surgeon: Marty Heck, MD;  Location: [place] [facility];  Service: Cardiovascular;  Laterality:  Right;  . PERIPHERAL VASCULAR INTERVENTION Right 02/01/2019   Procedure: PERIPHERAL VASCULAR INTERVENTION;  Surgeon: Marty Heck, MD;  Location: [place] [facility];  Service: Cardiovascular;  Laterality: Right;  SFA  . TEE WITHOUT CARDIOVERSION N/A 12/15/2016   Procedure: TRANSESOPHAGEAL ECHOCARDIOGRAM (TEE);  Surgeon: Sherren Mocha, MD;  Location: [place];  Service: Open Heart Surgery;  Laterality: N/A;  . TRANSCATHETER AORTIC VALVE REPLACEMENT, TRANSFEMORAL N/A 12/15/2016   Procedure: TRANSCATHETER AORTIC VALVE REPLACEMENT, TRANSFEMORAL;  Surgeon: Sherren Mocha, MD;  Location: [place];  Service: Open Heart Surgery;  Laterality: N/A;   Social History   Occupational History  . Not on file  Tobacco Use  . Smoking status: Former Smoker    Types: Cigarettes  . Smokeless tobacco: Never Used  Vaping Use  . Vaping Use: Never used  Substance and Sexual Activity  . Alcohol use: Yes    Comment: rarely  . Drug use: No  . Sexual activity: Not on file

## 2020-05-21 DIAGNOSIS — R3915 Urgency of urination: Secondary | ICD-10-CM | POA: Diagnosis not present

## 2020-05-21 DIAGNOSIS — R972 Elevated prostate specific antigen [PSA]: Secondary | ICD-10-CM | POA: Diagnosis not present

## 2020-05-22 ENCOUNTER — Other Ambulatory Visit: Payer: Self-pay

## 2020-05-22 DIAGNOSIS — I739 Peripheral vascular disease, unspecified: Secondary | ICD-10-CM

## 2020-05-23 ENCOUNTER — Ambulatory Visit: Payer: Medicare HMO | Admitting: Orthopedic Surgery

## 2020-05-23 DIAGNOSIS — L97511 Non-pressure chronic ulcer of other part of right foot limited to breakdown of skin: Secondary | ICD-10-CM

## 2020-05-24 ENCOUNTER — Encounter: Payer: Self-pay | Admitting: Orthopedic Surgery

## 2020-05-24 NOTE — Progress Notes (Signed)
Office Visit Note   Patient: Robert Horn           Date of Birth: [birth year]           MRN: [id number] Visit Date: 05/23/2020              Requested by: [name], MD [address] [place],  [place] [address] PCP: [name], MD  Chief Complaint  Patient presents with  . Right Foot - Follow-up      HPI: Patient is an 87 year old gentleman who presents in follow-up for plantar ulcer right foot first metatarsal head he is currently wearing compression stockings for his venous insufficiency and using a kneeling scooter at this time to unload pressure from his foot.  Assessment & Plan: Visit Diagnoses:  1. Right foot ulcer, limited to breakdown of skin ([place])     Plan: Patient is showing excellent improvement continue with the kneeling scooter minimize weightbearing.  Follow-Up Instructions: Return in about 2 weeks (around 06/06/2020).   Ortho Exam  Patient is alert, oriented, no adenopathy, well-dressed, normal affect, normal respiratory effort. Examination patient does have venous swelling but no open ulcers he has a small 3 mm ulcer beneath the first metatarsal head of the right foot this has 100% granulation tissue with no depth no signs of infection.  Imaging: No results found. No images are attached to the encounter.  Labs: Lab Results  Component Value Date   HGBA1C 7.0 (H) 01/31/2019   HGBA1C 6.4 (H) 12/13/2017   HGBA1C 6.4 (H) 12/09/2016   REPTSTATUS 02/04/2019 FINAL 01/30/2019   CULT  01/30/2019    NO GROWTH 5 DAYS Performed at [facility], [place] [address] [place], [place] [address]      Lab Results  Component Value Date   ALBUMIN 2.3 (L) 02/06/2019   ALBUMIN 2.2 (L) 02/05/2019   ALBUMIN 2.2 (L) 02/04/2019    Lab Results  Component Value Date   MG 2.1 02/06/2019   MG 2.3 02/05/2019   MG 2.3 02/04/2019   No results found for: VD25OH  No results found for: PREALBUMIN CBC EXTENDED Latest Ref Rng & Units 08/02/2019  02/10/2019 02/09/2019  WBC 4.0 - 10.5 K/uL 4.9 6.0 6.6  RBC 4.22 - 5.81 MIL/uL 4.19(L) 2.95(L) 2.92(L)  HGB 13.0 - 17.0 g/dL 12.1(L) 8.7(L) 8.7(L)  HCT 39.0 - 52.0 % 38.5(L) 27.6(L) 27.1(L)  PLT 150 - 400 K/uL 158 336 325  NEUTROABS 1.7 - 7.7 K/uL - - -  LYMPHSABS 0.7 - 4.0 K/uL - - -     There is no height or weight on file to calculate BMI.  Orders:  No orders of the defined types were placed in this encounter.  No orders of the defined types were placed in this encounter.    Procedures: No procedures performed  Clinical Data: No additional findings.  ROS:  All other systems negative, except as noted in the HPI. Review of Systems  Objective: Vital Signs: There were no vitals taken for this visit.  Specialty Comments:  No specialty comments available.  PMFS History: Patient Active Problem List   Diagnosis Date Noted  . Right foot ulcer, limited to breakdown of skin ([place]) 02/28/2020  . Osteomyelitis of second toe of right foot ([place])   . Osteomyelitis of great toe of right foot ([place])   . Severe protein-calorie malnutrition ([place])   . Diabetic polyneuropathy associated with type 2 diabetes mellitus ([place])   . Cutaneous abscess of right foot   .  Cellulitis of right foot 01/30/2019  . CKD (chronic kidney disease), stage III ([place]) 01/30/2019  . Elevated LFTs 01/30/2019  . Colon cancer ([place]) 01/30/2019  . Anemia of chronic disease 01/30/2019  . Acute on chronic combined systolic and diastolic CHF (congestive heart failure) ([place]) 03/09/2018  . AKI (acute kidney injury) ([place]) 03/09/2018  . Colonic mass 12/10/2017  . S/P TAVR (transcatheter aortic valve replacement) 03/03/2017  . Severe aortic stenosis 12/15/2016  . Essential hypertension 05/09/2013  . Hyperlipidemia 05/09/2013  . PVC's (premature ventricular contractions) 05/09/2013  . Chronic combined systolic and diastolic heart failure ([place]) 05/09/2013   Past Medical History:  Diagnosis Date  . Anemia   .  Cancer [facility]) 2019   colon- colectomy   . CHF (congestive heart failure) ([place])   . Diabetes mellitus without complication ([place])    Type II  . Dyslipidemia 10/27/2015  . Dyspnea    w/ exertion   . Elevated PSA 10/27/2015  . Erectile dysfunction 10/27/2015  . Heart murmur   . Hypertension 10/27/2015  . Hypogonadism male 10/27/2015  . Obesity 10/27/2015  . Peripheral vascular disease ([place])   . Pneumonia 10/27/2015   pt states was 1982  . Rotator cuff tear 10/27/2015    Family History  Problem Relation Age of Onset  . Diabetes Mother   . Heart disease Mother   . Pulmonary embolism Father     Past Surgical History:  Procedure Laterality Date  . ABDOMINAL AORTOGRAM N/A 02/01/2019   Procedure: ABDOMINAL AORTOGRAM;  Surgeon: Marty Heck, MD;  Location: [place] [facility];  Service: Cardiovascular;  Laterality: N/A;  . AMPUTATION Right 02/03/2019   Procedure: RIGHT FOOT 1ST RAY AMPUTATION;  Surgeon: [name], MD;  Location: [place];  Service: Orthopedics;  Laterality: Right;  . AMPUTATION Right 08/02/2019   Procedure: RIGHT 2ND TOE AMPUTATION;  Surgeon: [name], MD;  Location: [place];  Service: Orthopedics;  Laterality: Right;  . CARDIAC CATHETERIZATION N/A 11/14/2015   Procedure: Left Heart Cath and Coronary Angiography;  Surgeon: [name], MD;  Location: [place] [facility];  Service: Cardiovascular;  Laterality: N/A;  . COLONOSCOPY    . LAPAROSCOPIC PARTIAL COLECTOMY  12/15/2017   LAPAROSCOPIC PARTIAL COLECTOMY (N/A Abdomen)  . LAPAROSCOPIC PARTIAL COLECTOMY N/A 12/15/2017   Procedure: LAPAROSCOPIC PARTIAL COLECTOMY;  Surgeon: Erroll Luna, MD;  Location: [place];  Service: General;  Laterality: N/A;  . LOWER EXTREMITY ANGIOGRAPHY Right 02/01/2019   Procedure: LOWER EXTREMITY ANGIOGRAPHY;  Surgeon: Marty Heck, MD;  Location: [place] [facility];  Service: Cardiovascular;  Laterality: Right;  . PERIPHERAL VASCULAR INTERVENTION Right 02/01/2019   Procedure:  PERIPHERAL VASCULAR INTERVENTION;  Surgeon: Marty Heck, MD;  Location: [place] [facility];  Service: Cardiovascular;  Laterality: Right;  SFA  . TEE WITHOUT CARDIOVERSION N/A 12/15/2016   Procedure: TRANSESOPHAGEAL ECHOCARDIOGRAM (TEE);  Surgeon: Sherren Mocha, MD;  Location: [place];  Service: Open Heart Surgery;  Laterality: N/A;  . TRANSCATHETER AORTIC VALVE REPLACEMENT, TRANSFEMORAL N/A 12/15/2016   Procedure: TRANSCATHETER AORTIC VALVE REPLACEMENT, TRANSFEMORAL;  Surgeon: Sherren Mocha, MD;  Location: [place];  Service: Open Heart Surgery;  Laterality: N/A;   Social History   Occupational History  . Not on file  Tobacco Use  . Smoking status: Former Smoker    Types: Cigarettes  . Smokeless tobacco: Never Used  Vaping Use  . Vaping Use: Never used  Substance and Sexual Activity  . Alcohol use: Yes    Comment: rarely  .  Drug use: No  . Sexual activity: Not on file       

## 2020-06-03 DIAGNOSIS — E1159 Type 2 diabetes mellitus with other circulatory complications: Secondary | ICD-10-CM | POA: Diagnosis not present

## 2020-06-03 DIAGNOSIS — I739 Peripheral vascular disease, unspecified: Secondary | ICD-10-CM | POA: Diagnosis not present

## 2020-06-03 DIAGNOSIS — I5040 Unspecified combined systolic (congestive) and diastolic (congestive) heart failure: Secondary | ICD-10-CM | POA: Diagnosis not present

## 2020-06-03 DIAGNOSIS — N1831 Chronic kidney disease, stage 3a: Secondary | ICD-10-CM | POA: Diagnosis not present

## 2020-06-06 ENCOUNTER — Ambulatory Visit: Payer: Medicare HMO | Admitting: Orthopedic Surgery

## 2020-06-06 ENCOUNTER — Encounter: Payer: Self-pay | Admitting: Orthopedic Surgery

## 2020-06-06 DIAGNOSIS — L97511 Non-pressure chronic ulcer of other part of right foot limited to breakdown of skin: Secondary | ICD-10-CM | POA: Diagnosis not present

## 2020-06-06 NOTE — Progress Notes (Signed)
Office Visit Note   Patient: Robert Horn           Date of Birth: [birth year]           MRN: [id number] Visit Date: 06/06/2020              Requested by: [name], MD [address] [place] [address] [place],  [place] [address] PCP: [name], MD  Chief Complaint  Patient presents with  . Right Foot - Pain      HPI: Patient is a 87 year old gentleman who is seen in follow-up for Wagner grade 1 ulcer second metatarsal head right foot.  He is currently wearing knee-high compression stockings he states he has pain across the forefoot with weightbearing.  Assessment & Plan: Visit Diagnoses:  1. Right foot ulcer, limited to breakdown of skin [facility])     Plan: Patient will continue with his custom orthotics felt relieving donut and extra-depth shoes.  Follow-Up Instructions: Return in about 4 weeks (around 07/04/2020).   Ortho Exam  Patient is alert, oriented, no adenopathy, well-dressed, normal affect, normal respiratory effort. Examination patient has a strong posterior tibial pulse I cannot palpate a dorsalis pedis pulse there are no ischemic changes in his foot.  He has a Wagner grade 1 ulcer beneath a prominent second metatarsal head he has fixed clawing's of the lesser toes.  After informed consent a 10 blade knife was used to debride the skin and soft tissue back to healthy viable granulation tissue predebridement the ulcer is 5 mm in diameter after debridement this is 3 cm in diameter 0.1 mm deep with healthy granulation tissue no exposed bone or tendon.  Patient has a well fitting custom orthotic with a felt relieving donut to unload the metatarsal head.  Imaging: No results found. No images are attached to the encounter.  Labs: Lab Results  Component Value Date   HGBA1C 7.0 (H) 01/31/2019   HGBA1C 6.4 (H) 12/13/2017   HGBA1C 6.4 (H) 12/09/2016   REPTSTATUS 02/04/2019 FINAL 01/30/2019   CULT  01/30/2019    NO GROWTH 5 DAYS Performed at [facility],  [place] [address] [place],  [address]      Lab Results  Component Value Date   ALBUMIN 2.3 (L) 02/06/2019   ALBUMIN 2.2 (L) 02/05/2019   ALBUMIN 2.2 (L) 02/04/2019    Lab Results  Component Value Date   MG 2.1 02/06/2019   MG 2.3 02/05/2019   MG 2.3 02/04/2019   No results found for: VD25OH  No results found for: PREALBUMIN CBC EXTENDED Latest Ref Rng & Units 08/02/2019 02/10/2019 02/09/2019  WBC 4.0 - 10.5 K/uL 4.9 6.0 6.6  RBC 4.22 - 5.81 MIL/uL 4.19(L) 2.95(L) 2.92(L)  HGB 13.0 - 17.0 g/dL 12.1(L) 8.7(L) 8.7(L)  HCT 39.0 - 52.0 % 38.5(L) 27.6(L) 27.1(L)  PLT 150 - 400 K/uL 158 336 325  NEUTROABS 1.7 - 7.7 K/uL - - -  LYMPHSABS 0.7 - 4.0 K/uL - - -     There is no height or weight on file to calculate BMI.  Orders:  No orders of the defined types were placed in this encounter.  No orders of the defined types were placed in this encounter.    Procedures: No procedures performed  Clinical Data: No additional findings.  ROS:  All other systems negative, except as noted in the HPI. Review of Systems  Objective: Vital Signs: There were no vitals taken for this visit.  Specialty Comments:  No specialty  comments available.  PMFS History: Patient Active Problem List   Diagnosis Date Noted  . Right foot ulcer, limited to breakdown of skin ([place]) 02/28/2020  . Osteomyelitis of second toe of right foot ([place])   . Osteomyelitis of great toe of right foot ([place])   . Severe protein-calorie malnutrition ([place])   . Diabetic polyneuropathy associated with type 2 diabetes mellitus ([place])   . Cutaneous abscess of right foot   . Cellulitis of right foot 01/30/2019  . CKD (chronic kidney disease), stage III ([place]) 01/30/2019  . Elevated LFTs 01/30/2019  . Colon cancer ([place]) 01/30/2019  . Anemia of chronic disease 01/30/2019  . Acute on chronic combined systolic and diastolic CHF (congestive heart failure) ([place]) 03/09/2018  . AKI (acute kidney injury) ([place]) 03/09/2018  .  Colonic mass 12/10/2017  . S/P TAVR (transcatheter aortic valve replacement) 03/03/2017  . Severe aortic stenosis 12/15/2016  . Essential hypertension 05/09/2013  . Hyperlipidemia 05/09/2013  . PVC's (premature ventricular contractions) 05/09/2013  . Chronic combined systolic and diastolic heart failure ([place]) 05/09/2013   Past Medical History:  Diagnosis Date  . Anemia   . Cancer [facility]) 2019   colon- colectomy   . CHF (congestive heart failure) ([place])   . Diabetes mellitus without complication ([place])    Type II  . Dyslipidemia 10/27/2015  . Dyspnea    w/ exertion   . Elevated PSA 10/27/2015  . Erectile dysfunction 10/27/2015  . Heart murmur   . Hypertension 10/27/2015  . Hypogonadism male 10/27/2015  . Obesity 10/27/2015  . Peripheral vascular disease ([place])   . Pneumonia 10/27/2015   pt states was 1982  . Rotator cuff tear 10/27/2015    Family History  Problem Relation Age of Onset  . Diabetes Mother   . Heart disease Mother   . Pulmonary embolism Father     Past Surgical History:  Procedure Laterality Date  . ABDOMINAL AORTOGRAM N/A 02/01/2019   Procedure: ABDOMINAL AORTOGRAM;  Surgeon: Marty Heck, MD;  Location: [place] [facility];  Service: Cardiovascular;  Laterality: N/A;  . AMPUTATION Right 02/03/2019   Procedure: RIGHT FOOT 1ST RAY AMPUTATION;  Surgeon: [name], MD;  Location: [place];  Service: Orthopedics;  Laterality: Right;  . AMPUTATION Right 08/02/2019   Procedure: RIGHT 2ND TOE AMPUTATION;  Surgeon: [name], MD;  Location: [place];  Service: Orthopedics;  Laterality: Right;  . CARDIAC CATHETERIZATION N/A 11/14/2015   Procedure: Left Heart Cath and Coronary Angiography;  Surgeon: [name], MD;  Location: [place] [facility];  Service: Cardiovascular;  Laterality: N/A;  . COLONOSCOPY    . LAPAROSCOPIC PARTIAL COLECTOMY  12/15/2017   LAPAROSCOPIC PARTIAL COLECTOMY (N/A Abdomen)  . LAPAROSCOPIC PARTIAL COLECTOMY N/A 12/15/2017   Procedure:  LAPAROSCOPIC PARTIAL COLECTOMY;  Surgeon: Erroll Luna, MD;  Location: [place];  Service: General;  Laterality: N/A;  . LOWER EXTREMITY ANGIOGRAPHY Right 02/01/2019   Procedure: LOWER EXTREMITY ANGIOGRAPHY;  Surgeon: Marty Heck, MD;  Location: [place] [facility];  Service: Cardiovascular;  Laterality: Right;  . PERIPHERAL VASCULAR INTERVENTION Right 02/01/2019   Procedure: PERIPHERAL VASCULAR INTERVENTION;  Surgeon: Marty Heck, MD;  Location: [place] [facility];  Service: Cardiovascular;  Laterality: Right;  SFA  . TEE WITHOUT CARDIOVERSION N/A 12/15/2016   Procedure: TRANSESOPHAGEAL ECHOCARDIOGRAM (TEE);  Surgeon: Sherren Mocha, MD;  Location: [place];  Service: Open Heart Surgery;  Laterality: N/A;  . TRANSCATHETER AORTIC VALVE REPLACEMENT, TRANSFEMORAL N/A 12/15/2016   Procedure: TRANSCATHETER AORTIC  VALVE REPLACEMENT, TRANSFEMORAL;  Surgeon: Sherren Mocha, MD;  Location: [place];  Service: Open Heart Surgery;  Laterality: N/A;   Social History   Occupational History  . Not on file  Tobacco Use  . Smoking status: Former Smoker    Types: Cigarettes  . Smokeless tobacco: Never Used  Vaping Use  . Vaping Use: Never used  Substance and Sexual Activity  . Alcohol use: Yes    Comment: rarely  . Drug use: No  . Sexual activity: Not on file

## 2020-06-24 ENCOUNTER — Other Ambulatory Visit: Payer: Self-pay | Admitting: Interventional Cardiology

## 2020-06-25 ENCOUNTER — Other Ambulatory Visit: Payer: Self-pay | Admitting: Interventional Cardiology

## 2020-07-04 ENCOUNTER — Ambulatory Visit: Payer: Medicare HMO | Admitting: Orthopedic Surgery

## 2020-07-04 DIAGNOSIS — L97511 Non-pressure chronic ulcer of other part of right foot limited to breakdown of skin: Secondary | ICD-10-CM

## 2020-07-05 ENCOUNTER — Encounter: Payer: Self-pay | Admitting: Physician Assistant

## 2020-07-05 NOTE — Progress Notes (Signed)
Office Visit Note   Patient: Robert Horn           Date of Birth: September 03, 1933           MRN: [id number] Visit Date: 07/04/2020              Requested by: [name], MD [address] [place] [address] [place],  [place] [address] PCP: [name], MD  Chief Complaint  Patient presents with  . Right Foot - Pain, Follow-up      HPI: Patient is an 87 year old gentleman status post partial ray amputations on both feet with a persistent Wagner grade 1 ulcer beneath the third metatarsal head of the right foot.  Patient is wearing knee-high compression stockings and is doing exercises.  Assessment & Plan: Visit Diagnoses:  1. Right foot ulcer, limited to breakdown of skin ([place])     Plan: Recommend continue with his protective orthotics and felt relieving donut continue with minimizing weightbearing across the right foot to allow for wound healing.  Follow-Up Instructions: Return in about 4 weeks (around 08/01/2020).   Ortho Exam  Patient is alert, oriented, no adenopathy, well-dressed, normal affect, normal respiratory effort. Semination patient has a strong palpable posterior tibial pulse bilaterally he does not have a dorsalis pedis pulse that is palpable.  The left foot has no skin breakdown ulcers or calluses.  Patient has a Wagner grade 1 ulcer beneath the right foot third metatarsal head.  This is 10 mm in diameter 3 mm deep.  After informed consent a 10 blade knife was used to debride the skin and soft tissue back to healthy viable granulation tissue after debridement the ulcer is 2 cm in diameter 3 mm deep this does not probe to bone.  Patient has a good felt relieving donut underneath the custom orthotic right shoe.  Imaging: No results found. No images are attached to the encounter.  Labs: Lab Results  Component Value Date   HGBA1C 7.0 (H) 01/31/2019   HGBA1C 6.4 (H) 12/13/2017   HGBA1C 6.4 (H) 12/09/2016   REPTSTATUS 02/04/2019 FINAL 01/30/2019   CULT  01/30/2019     NO GROWTH 5 DAYS Performed at [facility], [place] [address], [place], [place] [address]      Lab Results  Component Value Date   ALBUMIN 2.3 (L) 02/06/2019   ALBUMIN 2.2 (L) 02/05/2019   ALBUMIN 2.2 (L) 02/04/2019    Lab Results  Component Value Date   MG 2.1 02/06/2019   MG 2.3 02/05/2019   MG 2.3 02/04/2019   No results found for: VD25OH  No results found for: PREALBUMIN CBC EXTENDED Latest Ref Rng & Units 08/02/2019 02/10/2019 02/09/2019  WBC 4.0 - 10.5 K/uL 4.9 6.0 6.6  RBC 4.22 - 5.81 MIL/uL 4.19(L) 2.95(L) 2.92(L)  HGB 13.0 - 17.0 g/dL 12.1(L) 8.7(L) 8.7(L)  HCT 39.0 - 52.0 % 38.5(L) 27.6(L) 27.1(L)  PLT 150 - 400 K/uL 158 336 325  NEUTROABS 1.7 - 7.7 K/uL - - -  LYMPHSABS 0.7 - 4.0 K/uL - - -     There is no height or weight on file to calculate BMI.  Orders:  No orders of the defined types were placed in this encounter.  No orders of the defined types were placed in this encounter.    Procedures: No procedures performed  Clinical Data: No additional findings.  ROS:  All other systems negative, except as noted in the HPI. Review of Systems  Objective: Vital Signs: There were no  vitals taken for this visit.  Specialty Comments:  No specialty comments available.  PMFS History: Patient Active Problem List   Diagnosis Date Noted  . Right foot ulcer, limited to breakdown of skin ([place]) 02/28/2020  . Osteomyelitis of second toe of right foot ([place])   . Osteomyelitis of great toe of right foot ([place])   . Severe protein-calorie malnutrition ([place])   . Diabetic polyneuropathy associated with type 2 diabetes mellitus ([place])   . Cutaneous abscess of right foot   . Cellulitis of right foot 01/30/2019  . CKD (chronic kidney disease), stage III ([place]) 01/30/2019  . Elevated LFTs 01/30/2019  . Colon cancer ([place]) 01/30/2019  . Anemia of chronic disease 01/30/2019  . Acute on chronic combined systolic and diastolic CHF (congestive heart failure) ([place])  03/09/2018  . AKI (acute kidney injury) ([place]) 03/09/2018  . Colonic mass 12/10/2017  . S/P TAVR (transcatheter aortic valve replacement) 03/03/2017  . Severe aortic stenosis 12/15/2016  . Essential hypertension 05/09/2013  . Hyperlipidemia 05/09/2013  . PVC's (premature ventricular contractions) 05/09/2013  . Chronic combined systolic and diastolic heart failure ([place]) 05/09/2013   Past Medical History:  Diagnosis Date  . Anemia   . Cancer [facility]) 2019   colon- colectomy   . CHF (congestive heart failure) ([place])   . Diabetes mellitus without complication ([place])    Type II  . Dyslipidemia 10/27/2015  . Dyspnea    w/ exertion   . Elevated PSA 10/27/2015  . Erectile dysfunction 10/27/2015  . Heart murmur   . Hypertension 10/27/2015  . Hypogonadism male 10/27/2015  . Obesity 10/27/2015  . Peripheral vascular disease ([place])   . Pneumonia 10/27/2015   pt states was 1982  . Rotator cuff tear 10/27/2015    Family History  Problem Relation Age of Onset  . Diabetes Mother   . Heart disease Mother   . Pulmonary embolism Father     Past Surgical History:  Procedure Laterality Date  . ABDOMINAL AORTOGRAM N/A 02/01/2019   Procedure: ABDOMINAL AORTOGRAM;  Surgeon: Marty Heck, MD;  Location: [place] [facility];  Service: Cardiovascular;  Laterality: N/A;  . AMPUTATION Right 02/03/2019   Procedure: RIGHT FOOT 1ST RAY AMPUTATION;  Surgeon: [name], MD;  Location: [place];  Service: Orthopedics;  Laterality: Right;  . AMPUTATION Right 08/02/2019   Procedure: RIGHT 2ND TOE AMPUTATION;  Surgeon: [name], MD;  Location: [place];  Service: Orthopedics;  Laterality: Right;  . CARDIAC CATHETERIZATION N/A 11/14/2015   Procedure: Left Heart Cath and Coronary Angiography;  Surgeon: [name], MD;  Location: [place] [facility];  Service: Cardiovascular;  Laterality: N/A;  . COLONOSCOPY    . LAPAROSCOPIC PARTIAL COLECTOMY  12/15/2017   LAPAROSCOPIC PARTIAL COLECTOMY (N/A Abdomen)  .  LAPAROSCOPIC PARTIAL COLECTOMY N/A 12/15/2017   Procedure: LAPAROSCOPIC PARTIAL COLECTOMY;  Surgeon: Erroll Luna, MD;  Location: [place];  Service: General;  Laterality: N/A;  . LOWER EXTREMITY ANGIOGRAPHY Right 02/01/2019   Procedure: LOWER EXTREMITY ANGIOGRAPHY;  Surgeon: Marty Heck, MD;  Location: [place] [facility];  Service: Cardiovascular;  Laterality: Right;  . PERIPHERAL VASCULAR INTERVENTION Right 02/01/2019   Procedure: PERIPHERAL VASCULAR INTERVENTION;  Surgeon: Marty Heck, MD;  Location: [place] [facility];  Service: Cardiovascular;  Laterality: Right;  SFA  . TEE WITHOUT CARDIOVERSION N/A 12/15/2016   Procedure: TRANSESOPHAGEAL ECHOCARDIOGRAM (TEE);  Surgeon: Sherren Mocha, MD;  Location: [place];  Service: Open Heart Surgery;  Laterality: N/A;  . TRANSCATHETER  AORTIC VALVE REPLACEMENT, TRANSFEMORAL N/A 12/15/2016   Procedure: TRANSCATHETER AORTIC VALVE REPLACEMENT, TRANSFEMORAL;  Surgeon: Sherren Mocha, MD;  Location: [place];  Service: Open Heart Surgery;  Laterality: N/A;   Social History   Occupational History  . Not on file  Tobacco Use  . Smoking status: Former Smoker    Types: Cigarettes  . Smokeless tobacco: Never Used  Vaping Use  . Vaping Use: Never used  Substance and Sexual Activity  . Alcohol use: Yes    Comment: rarely  . Drug use: No  . Sexual activity: Not on file

## 2020-08-01 ENCOUNTER — Encounter: Payer: Self-pay | Admitting: Orthopedic Surgery

## 2020-08-01 ENCOUNTER — Ambulatory Visit: Payer: Self-pay

## 2020-08-01 ENCOUNTER — Ambulatory Visit: Payer: Medicare HMO | Admitting: Orthopedic Surgery

## 2020-08-01 DIAGNOSIS — L97511 Non-pressure chronic ulcer of other part of right foot limited to breakdown of skin: Secondary | ICD-10-CM

## 2020-08-01 DIAGNOSIS — M79671 Pain in right foot: Secondary | ICD-10-CM | POA: Diagnosis not present

## 2020-08-01 NOTE — Progress Notes (Signed)
Office Visit Note   Patient: Robert Horn           Date of Birth: 10-07-1933           MRN: [id number] Visit Date: 08/01/2020              Requested by: [name], MD [address] [place] [address] [place],  [place] [address] PCP: [name], MD  Chief Complaint  Patient presents with  . Right Foot - Follow-up      HPI: Patient is an 87 year old gentleman who is status post first ray and second toe amputation with a persistent ulcer beneath the fourth metatarsal head patient has had felt relieving pads placed in his shoe wear he has undergone serial debridements for the ulcer.  Patient states he is on blood thinners for his heart.  Assessment & Plan: Visit Diagnoses:  1. Pain in right foot   2. Right foot ulcer, limited to breakdown of skin ([place])     Plan: We will provide him a [place] shoe he was placed in a medical compression stocking discussed the importance of minimizing weightbearing his wife was on the phone and she will try to get him into the kneeling scooter and try to get him off his foot.  Follow-Up Instructions: No follow-ups on file.   Ortho Exam  Patient is alert, oriented, no adenopathy, well-dressed, normal affect, normal respiratory effort. Examination patient has good pulses he has hypertrophic callus beneath the fourth metatarsal head ulcer.  After informed consent a 10 blade knife was used to debride the skin and soft tissue back to healthy viable granulation tissue the ulcer was 10 mm in diameter prior to debridement it was 2 cm after debridement and 2 mm deep this did not probe to bone.  There is good healthy bleeding granulation tissue and this was touched with silver nitrate patient does have Achilles tightness with dorsiflexion to neutral.  Imaging: XR Foot Complete Right  Result Date: 08/01/2020 Three-view radiographs of the right foot shows previous first ray and second toe amputation patient does have a prominent second metatarsal head however  his ulcer is beneath the fourth metatarsal head.  There are no destructive bony changes.  No images are attached to the encounter.  Labs: Lab Results  Component Value Date   HGBA1C 7.0 (H) 01/31/2019   HGBA1C 6.4 (H) 12/13/2017   HGBA1C 6.4 (H) 12/09/2016   REPTSTATUS 02/04/2019 FINAL 01/30/2019   CULT  01/30/2019    NO GROWTH 5 DAYS Performed at [facility], [place] [address] [place], [place] [address]      Lab Results  Component Value Date   ALBUMIN 2.3 (L) 02/06/2019   ALBUMIN 2.2 (L) 02/05/2019   ALBUMIN 2.2 (L) 02/04/2019    Lab Results  Component Value Date   MG 2.1 02/06/2019   MG 2.3 02/05/2019   MG 2.3 02/04/2019   No results found for: VD25OH  No results found for: PREALBUMIN CBC EXTENDED Latest Ref Rng & Units 08/02/2019 02/10/2019 02/09/2019  WBC 4.0 - 10.5 K/uL 4.9 6.0 6.6  RBC 4.22 - 5.81 MIL/uL 4.19(L) 2.95(L) 2.92(L)  HGB 13.0 - 17.0 g/dL 12.1(L) 8.7(L) 8.7(L)  HCT 39.0 - 52.0 % 38.5(L) 27.6(L) 27.1(L)  PLT 150 - 400 K/uL 158 336 325  NEUTROABS 1.7 - 7.7 K/uL - - -  LYMPHSABS 0.7 - 4.0 K/uL - - -     There is no height or weight on file to calculate BMI.  Orders:  Orders Placed This Encounter  Procedures  . XR Foot Complete Right   No orders of the defined types were placed in this encounter.    Procedures: No procedures performed  Clinical Data: No additional findings.  ROS:  All other systems negative, except as noted in the HPI. Review of Systems  Objective: Vital Signs: There were no vitals taken for this visit.  Specialty Comments:  No specialty comments available.  PMFS History: Patient Active Problem List   Diagnosis Date Noted  . Right foot ulcer, limited to breakdown of skin ([place]) 02/28/2020  . Osteomyelitis of second toe of right foot ([place])   . Osteomyelitis of great toe of right foot ([place])   . Severe protein-calorie malnutrition ([place])   . Diabetic polyneuropathy associated with type 2 diabetes mellitus ([place])    . Cutaneous abscess of right foot   . Cellulitis of right foot 01/30/2019  . CKD (chronic kidney disease), stage III ([place]) 01/30/2019  . Elevated LFTs 01/30/2019  . Colon cancer ([place]) 01/30/2019  . Anemia of chronic disease 01/30/2019  . Acute on chronic combined systolic and diastolic CHF (congestive heart failure) ([place]) 03/09/2018  . AKI (acute kidney injury) ([place]) 03/09/2018  . Colonic mass 12/10/2017  . S/P TAVR (transcatheter aortic valve replacement) 03/03/2017  . Severe aortic stenosis 12/15/2016  . Essential hypertension 05/09/2013  . Hyperlipidemia 05/09/2013  . PVC's (premature ventricular contractions) 05/09/2013  . Chronic combined systolic and diastolic heart failure ([place]) 05/09/2013   Past Medical History:  Diagnosis Date  . Anemia   . Cancer [facility]) 2019   colon- colectomy   . CHF (congestive heart failure) ([place])   . Diabetes mellitus without complication ([place])    Type II  . Dyslipidemia 10/27/2015  . Dyspnea    w/ exertion   . Elevated PSA 10/27/2015  . Erectile dysfunction 10/27/2015  . Heart murmur   . Hypertension 10/27/2015  . Hypogonadism male 10/27/2015  . Obesity 10/27/2015  . Peripheral vascular disease ([place])   . Pneumonia 10/27/2015   pt states was 1982  . Rotator cuff tear 10/27/2015    Family History  Problem Relation Age of Onset  . Diabetes Mother   . Heart disease Mother   . Pulmonary embolism Father     Past Surgical History:  Procedure Laterality Date  . ABDOMINAL AORTOGRAM N/A 02/01/2019   Procedure: ABDOMINAL AORTOGRAM;  Surgeon: Marty Heck, MD;  Location: [place] [facility];  Service: Cardiovascular;  Laterality: N/A;  . AMPUTATION Right 02/03/2019   Procedure: RIGHT FOOT 1ST RAY AMPUTATION;  Surgeon: [name], MD;  Location: [place];  Service: Orthopedics;  Laterality: Right;  . AMPUTATION Right 08/02/2019   Procedure: RIGHT 2ND TOE AMPUTATION;  Surgeon: [name], MD;  Location: [place];  Service: Orthopedics;   Laterality: Right;  . CARDIAC CATHETERIZATION N/A 11/14/2015   Procedure: Left Heart Cath and Coronary Angiography;  Surgeon: [name], MD;  Location: [place] [facility];  Service: Cardiovascular;  Laterality: N/A;  . COLONOSCOPY    . LAPAROSCOPIC PARTIAL COLECTOMY  12/15/2017   LAPAROSCOPIC PARTIAL COLECTOMY (N/A Abdomen)  . LAPAROSCOPIC PARTIAL COLECTOMY N/A 12/15/2017   Procedure: LAPAROSCOPIC PARTIAL COLECTOMY;  Surgeon: Erroll Luna, MD;  Location: [place];  Service: General;  Laterality: N/A;  . LOWER EXTREMITY ANGIOGRAPHY Right 02/01/2019   Procedure: LOWER EXTREMITY ANGIOGRAPHY;  Surgeon: Marty Heck, MD;  Location: [place] [facility];  Service: Cardiovascular;  Laterality: Right;  . PERIPHERAL VASCULAR INTERVENTION  Right 02/01/2019   Procedure: PERIPHERAL VASCULAR INTERVENTION;  Surgeon: Marty Heck, MD;  Location: [place] [facility];  Service: Cardiovascular;  Laterality: Right;  SFA  . TEE WITHOUT CARDIOVERSION N/A 12/15/2016   Procedure: TRANSESOPHAGEAL ECHOCARDIOGRAM (TEE);  Surgeon: Sherren Mocha, MD;  Location: [place];  Service: Open Heart Surgery;  Laterality: N/A;  . TRANSCATHETER AORTIC VALVE REPLACEMENT, TRANSFEMORAL N/A 12/15/2016   Procedure: TRANSCATHETER AORTIC VALVE REPLACEMENT, TRANSFEMORAL;  Surgeon: Sherren Mocha, MD;  Location: [place];  Service: Open Heart Surgery;  Laterality: N/A;   Social History   Occupational History  . Not on file  Tobacco Use  . Smoking status: Former Smoker    Types: Cigarettes  . Smokeless tobacco: Never Used  Vaping Use  . Vaping Use: Never used  Substance and Sexual Activity  . Alcohol use: Yes    Comment: rarely  . Drug use: No  . Sexual activity: Not on file

## 2020-08-02 DIAGNOSIS — E1165 Type 2 diabetes mellitus with hyperglycemia: Secondary | ICD-10-CM | POA: Diagnosis not present

## 2020-08-02 DIAGNOSIS — Z1389 Encounter for screening for other disorder: Secondary | ICD-10-CM | POA: Diagnosis not present

## 2020-08-02 DIAGNOSIS — E1159 Type 2 diabetes mellitus with other circulatory complications: Secondary | ICD-10-CM | POA: Diagnosis not present

## 2020-08-02 DIAGNOSIS — I739 Peripheral vascular disease, unspecified: Secondary | ICD-10-CM | POA: Diagnosis not present

## 2020-08-02 DIAGNOSIS — N401 Enlarged prostate with lower urinary tract symptoms: Secondary | ICD-10-CM | POA: Diagnosis not present

## 2020-08-02 DIAGNOSIS — Z0000 Encounter for general adult medical examination without abnormal findings: Secondary | ICD-10-CM | POA: Diagnosis not present

## 2020-08-02 DIAGNOSIS — E7800 Pure hypercholesterolemia, unspecified: Secondary | ICD-10-CM | POA: Diagnosis not present

## 2020-08-02 DIAGNOSIS — L97511 Non-pressure chronic ulcer of other part of right foot limited to breakdown of skin: Secondary | ICD-10-CM | POA: Diagnosis not present

## 2020-08-02 DIAGNOSIS — I5040 Unspecified combined systolic (congestive) and diastolic (congestive) heart failure: Secondary | ICD-10-CM | POA: Diagnosis not present

## 2020-08-02 DIAGNOSIS — Z85038 Personal history of other malignant neoplasm of large intestine: Secondary | ICD-10-CM | POA: Diagnosis not present

## 2020-08-02 DIAGNOSIS — N1831 Chronic kidney disease, stage 3a: Secondary | ICD-10-CM | POA: Diagnosis not present

## 2020-08-02 DIAGNOSIS — Z7984 Long term (current) use of oral hypoglycemic drugs: Secondary | ICD-10-CM | POA: Diagnosis not present

## 2020-08-02 DIAGNOSIS — I10 Essential (primary) hypertension: Secondary | ICD-10-CM | POA: Diagnosis not present

## 2020-08-13 ENCOUNTER — Ambulatory Visit: Payer: Medicare HMO | Admitting: Physician Assistant

## 2020-08-13 ENCOUNTER — Encounter: Payer: Self-pay | Admitting: Physician Assistant

## 2020-08-13 ENCOUNTER — Other Ambulatory Visit: Payer: Self-pay

## 2020-08-13 ENCOUNTER — Ambulatory Visit (INDEPENDENT_AMBULATORY_CARE_PROVIDER_SITE_OTHER)
Admission: RE | Admit: 2020-08-13 | Discharge: 2020-08-13 | Disposition: A | Discharge status: Home / Self Care | Payer: Medicare HMO | Source: Ambulatory Visit | Attending: Vascular Surgery | Admitting: Vascular Surgery

## 2020-08-13 ENCOUNTER — Ambulatory Visit (HOSPITAL_COMMUNITY)
Admission: RE | Admit: 2020-08-13 | Discharge: 2020-08-13 | Disposition: A | Discharge status: Home / Self Care | Payer: Medicare HMO | Source: Ambulatory Visit | Attending: Vascular Surgery | Admitting: Vascular Surgery

## 2020-08-13 VITALS — BP 128/80 | HR 70 | Temp 97.8°F | Resp 20 | Ht 72.0 in | Wt 230.0 lb

## 2020-08-13 DIAGNOSIS — I739 Peripheral vascular disease, unspecified: Secondary | ICD-10-CM

## 2020-08-13 NOTE — Progress Notes (Addendum)
Office Note     CC:  follow up Requesting Provider:  [name], MD  HPI: Robert Horn is a 87 y.o. (1933/05/11) male who presents for evaluation of PAD.  He underwent right SFA angioplasty and stent placement by Dr. Carlis Abbott on 02/01/2019.  Several days later he underwent first toe ray amputation by Dr. Sharol Given.  He has since also had second toe amputation on 08/02/2019 by Dr. Sharol Given.  Both amputations have since healed.  He is now caring for a pressure sore of his plantar foot.  He denies any rest pain or other tissue changes of right foot.  He was seen by Dr. Sharol Given this month who fitted him for a Darco shoe.  Patient states that he is trying to avoid all pressure to the area of his ulcer.  He continues to take aspirin, Plavix, statin daily.  He denies tobacco use.   Past Medical History:  Diagnosis Date  . Anemia   . Cancer [facility]) 2019   colon- colectomy   . CHF (congestive heart failure) ([place])   . Diabetes mellitus without complication ([place])    Type II  . Dyslipidemia 10/27/2015  . Dyspnea    w/ exertion   . Elevated PSA 10/27/2015  . Erectile dysfunction 10/27/2015  . Heart murmur   . Hypertension 10/27/2015  . Hypogonadism male 10/27/2015  . Obesity 10/27/2015  . Peripheral vascular disease ([place])   . Pneumonia 10/27/2015   pt states was 1982  . Rotator cuff tear 10/27/2015    Past Surgical History:  Procedure Laterality Date  . ABDOMINAL AORTOGRAM N/A 02/01/2019   Procedure: ABDOMINAL AORTOGRAM;  Surgeon: Marty Heck, MD;  Location: [place] [facility];  Service: Cardiovascular;  Laterality: N/A;  . AMPUTATION Right 02/03/2019   Procedure: RIGHT FOOT 1ST RAY AMPUTATION;  Surgeon: [name], MD;  Location: [place];  Service: Orthopedics;  Laterality: Right;  . AMPUTATION Right 08/02/2019   Procedure: RIGHT 2ND TOE AMPUTATION;  Surgeon: [name], MD;  Location: [place];  Service: Orthopedics;  Laterality: Right;  . CARDIAC CATHETERIZATION N/A 11/14/2015   Procedure:  Left Heart Cath and Coronary Angiography;  Surgeon: [name], MD;  Location: [place] [facility];  Service: Cardiovascular;  Laterality: N/A;  . COLONOSCOPY    . LAPAROSCOPIC PARTIAL COLECTOMY  12/15/2017   LAPAROSCOPIC PARTIAL COLECTOMY (N/A Abdomen)  . LAPAROSCOPIC PARTIAL COLECTOMY N/A 12/15/2017   Procedure: LAPAROSCOPIC PARTIAL COLECTOMY;  Surgeon: Erroll Luna, MD;  Location: [place];  Service: General;  Laterality: N/A;  . LOWER EXTREMITY ANGIOGRAPHY Right 02/01/2019   Procedure: LOWER EXTREMITY ANGIOGRAPHY;  Surgeon: Marty Heck, MD;  Location: [place] [facility];  Service: Cardiovascular;  Laterality: Right;  . PERIPHERAL VASCULAR INTERVENTION Right 02/01/2019   Procedure: PERIPHERAL VASCULAR INTERVENTION;  Surgeon: Marty Heck, MD;  Location: [place] [facility];  Service: Cardiovascular;  Laterality: Right;  SFA  . TEE WITHOUT CARDIOVERSION N/A 12/15/2016   Procedure: TRANSESOPHAGEAL ECHOCARDIOGRAM (TEE);  Surgeon: Sherren Mocha, MD;  Location: [place];  Service: Open Heart Surgery;  Laterality: N/A;  . TRANSCATHETER AORTIC VALVE REPLACEMENT, TRANSFEMORAL N/A 12/15/2016   Procedure: TRANSCATHETER AORTIC VALVE REPLACEMENT, TRANSFEMORAL;  Surgeon: Sherren Mocha, MD;  Location: [place];  Service: Open Heart Surgery;  Laterality: N/A;    Social History   Socioeconomic History  . Marital status: Single    Spouse name: Not on file  . Number of children: Not on file  . Years of education: Not on  file  . Highest education level: Not on file  Occupational History  . Not on file  Tobacco Use  . Smoking status: Former Smoker    Types: Cigarettes  . Smokeless tobacco: Never Used  Vaping Use  . Vaping Use: Never used  Substance and Sexual Activity  . Alcohol use: Yes    Comment: rarely  . Drug use: No  . Sexual activity: Not on file  Other Topics Concern  . Not on file  Social History Narrative  . Not on file   Social Determinants of Health   Financial  Resource Strain: Not on file  Food Insecurity: Not on file  Transportation Needs: Not on file  Physical Activity: Not on file  Stress: Not on file  Social Connections: Not on file  Intimate Partner Violence: Not on file    Family History  Problem Relation Age of Onset  . Diabetes Mother   . Heart disease Mother   . Pulmonary embolism Father     Current Outpatient Medications  Medication Sig Dispense Refill  . amLODipine (NORVASC) 10 MG tablet Take 1 tablet (10 mg total) by mouth daily. 90 tablet 2  . aspirin 81 MG EC tablet Take 81 mg by mouth daily. Swallow whole.    . carvedilol (COREG) 3.125 MG tablet TAKE 1 TABLET (3.125 MG TOTAL) BY MOUTH 2 (TWO) TIMES DAILY. 180 tablet 3  . Cholecalciferol (VITAMIN D) 50 MCG (2000 UT) tablet 1 tablet    . clopidogrel (PLAVIX) 75 MG tablet TAKE 1 TABLET (75 MG TOTAL) BY MOUTH DAILY WITH BREAKFAST. 90 tablet 3  . ENTRESTO 49-51 MG TAKE 1 TABLET BY MOUTH TWICE A DAY 180 tablet 3  . ferrous sulfate 325 (65 FE) MG tablet Take 325 mg by mouth daily with breakfast.    . finasteride (PROSCAR) 5 MG tablet Take 5 mg by mouth daily.    . furosemide (LASIX) 40 MG tablet TAKE 1.5 TABLETS BY MOUTH DAILY. 135 tablet 1  . JARDIANCE 25 MG TABS tablet Take 25 mg by mouth daily.    . Multiple Vitamin (MULTIVITAMIN WITH MINERALS) TABS tablet Take 1 tablet by mouth daily. 30 tablet 1  . naproxen sodium (ALEVE) 220 MG tablet Take 220 mg by mouth daily as needed (pain).    Marland Kitchen omega-3 acid ethyl esters (LOVAZA) 1 g capsule TAKE 1 CAPSULE BY MOUTH EVERY DAY 90 capsule 1  . rosuvastatin (CRESTOR) 20 MG tablet Take 20 mg by mouth daily.    . silver sulfADIAZINE (SILVADENE) 1 % cream Apply 1 application topically daily. 50 g 0  . tamsulosin (FLOMAX) 0.4 MG CAPS capsule Take 0.4 mg by mouth daily.    Marland Kitchen VITAMIN B COMPLEX-C PO Take 1 tablet by mouth daily.    Marland Kitchen gabapentin (NEURONTIN) 300 MG capsule 1 capsule (Patient not taking: Reported on 08/13/2020)     No current  facility-administered medications for this visit.    No Known Allergies   REVIEW OF SYSTEMS:   [X]  denotes positive finding, [ ]  denotes negative finding Cardiac  Comments:  Chest pain or chest pressure:    Shortness of breath upon exertion:    Short of breath when lying flat:    Irregular heart rhythm:        Vascular    Pain in calf, thigh, or hip brought on by ambulation:    Pain in feet at night that wakes you up from your sleep:     Blood clot in your veins:  Leg swelling:         Pulmonary    Oxygen at home:    Productive cough:     Wheezing:         Neurologic    Sudden weakness in arms or legs:     Sudden numbness in arms or legs:     Sudden onset of difficulty speaking or slurred speech:    Temporary loss of vision in one eye:     Problems with dizziness:         Gastrointestinal    Blood in stool:     Vomited blood:         Genitourinary    Burning when urinating:     Blood in urine:        Psychiatric    Major depression:         Hematologic    Bleeding problems:    Problems with blood clotting too easily:        Skin    Rashes or ulcers:        Constitutional    Fever or chills:      PHYSICAL EXAMINATION:  Vitals:   08/13/20 1407  BP: 128/80  Pulse: 70  Resp: 20  Temp: 97.8 F (36.6 C)  TempSrc: Temporal  SpO2: 96%  Weight: 230 lb (104.3 kg)  Height: 6' (1.829 m)    General:  WDWN in NAD; vital signs documented above Gait: Not observed HENT: WNL, normocephalic Pulmonary: normal non-labored breathing Cardiac: regular HR Abdomen: soft, NT, no masses Skin: without rashes Vascular Exam/Pulses:  Right Left  Radial 2+ (normal) 2+ (normal)  DP absent absent  PT 2+ (normal) absent   Extremities: pressure wound plantar foot without surrounding erythema or drainage Musculoskeletal: no muscle wasting or atrophy  Neurologic: A&O X 3;  No focal weakness or paresthesias are detected Psychiatric:  The pt has Normal  affect.   Non-Invasive Vascular Imaging:   Patent right SFA stent  Right  Rt Pressure (mmHg)IndexWaveformComment       +--------+------------------+-----+--------+-----------------+  [id number]                          +--------+------------------+-----+--------+-----------------+  ATA   125        0.92 biphasicMay be collateral  +--------+------------------+-----+--------+-----------------+  PTA   129        0.95 biphasic           +--------+------------------+-----+--------+-----------------+   +---------+------------------+-----+----------+-------+  Left   Lt Pressure (mmHg)IndexWaveform Comment  +---------+------------------+-----+----------+-------+  ATA   90        0.66 monophasic      +---------+------------------+-----+----------+-------+  PTA   105        0.77 monophasic      +---------+------------------+-----+----------+-------+  Great Toe12        0.09 Abnormal    ASSESSMENT/PLAN:: 87 y.o. male here for evaluation of PAD with history of SFA stent  -Right foot well-perfused with 2+ palpable PTA pulse -Arterial duplex demonstrates a widely patent SFA stent and diffuse tibial disease -Nothing further to offer from a vascular surgery standpoint -Continue wound care and heel weightbearing per Dr. Sharol Given -Continue aspirin, Plavix, statin daily -Recheck ABI and arterial duplex in 6 months   Dagoberto Ligas, PA-C [facility] [phone]  Clinic MD:   Carlis Abbott

## 2020-08-14 ENCOUNTER — Other Ambulatory Visit: Payer: Self-pay | Admitting: *Deleted

## 2020-08-14 DIAGNOSIS — I739 Peripheral vascular disease, unspecified: Secondary | ICD-10-CM

## 2020-08-27 DIAGNOSIS — I10 Essential (primary) hypertension: Secondary | ICD-10-CM | POA: Diagnosis not present

## 2020-08-27 DIAGNOSIS — E7800 Pure hypercholesterolemia, unspecified: Secondary | ICD-10-CM | POA: Diagnosis not present

## 2020-08-27 DIAGNOSIS — N401 Enlarged prostate with lower urinary tract symptoms: Secondary | ICD-10-CM | POA: Diagnosis not present

## 2020-08-27 DIAGNOSIS — D509 Iron deficiency anemia, unspecified: Secondary | ICD-10-CM | POA: Diagnosis not present

## 2020-08-27 DIAGNOSIS — N1831 Chronic kidney disease, stage 3a: Secondary | ICD-10-CM | POA: Diagnosis not present

## 2020-08-27 DIAGNOSIS — I130 Hypertensive heart and chronic kidney disease with heart failure and stage 1 through stage 4 chronic kidney disease, or unspecified chronic kidney disease: Secondary | ICD-10-CM | POA: Diagnosis not present

## 2020-08-27 DIAGNOSIS — E1159 Type 2 diabetes mellitus with other circulatory complications: Secondary | ICD-10-CM | POA: Diagnosis not present

## 2020-08-27 DIAGNOSIS — E1169 Type 2 diabetes mellitus with other specified complication: Secondary | ICD-10-CM | POA: Diagnosis not present

## 2020-08-27 DIAGNOSIS — I5040 Unspecified combined systolic (congestive) and diastolic (congestive) heart failure: Secondary | ICD-10-CM | POA: Diagnosis not present

## 2020-08-29 ENCOUNTER — Ambulatory Visit: Payer: Self-pay

## 2020-08-29 ENCOUNTER — Encounter: Payer: Self-pay | Admitting: Orthopedic Surgery

## 2020-08-29 ENCOUNTER — Ambulatory Visit: Payer: Medicare HMO | Admitting: Orthopedic Surgery

## 2020-08-29 VITALS — Ht 72.0 in | Wt 230.0 lb

## 2020-08-29 DIAGNOSIS — M86271 Subacute osteomyelitis, right ankle and foot: Secondary | ICD-10-CM

## 2020-08-29 DIAGNOSIS — M79671 Pain in right foot: Secondary | ICD-10-CM

## 2020-09-04 ENCOUNTER — Encounter: Payer: Self-pay | Admitting: Orthopedic Surgery

## 2020-09-08 ENCOUNTER — Encounter: Payer: Self-pay | Admitting: Orthopedic Surgery

## 2020-09-08 NOTE — Progress Notes (Signed)
Office Visit Note   Patient: Robert Horn           Date of Birth: 11/02/1933           MRN: [id number] Visit Date: 08/29/2020              Requested by: [name], MD [address] [place],  [place] [address] PCP: [name], MD  Chief Complaint  Patient presents with   Right Foot - Follow-up    S/p 1st ray amputation 2020 and 2nd toe amputation 2021      HPI: Patient is a 87 year old gentleman who is seen in follow-up for his right foot he is status post a first ray amputation and second toe amputation.  He is currently wearing a Darco shoe and a compression sock he states that the ulcer is not healing on the plantar aspect of the third metatarsal head.  Assessment & Plan: Visit Diagnoses:  1. Pain in right foot   2. Subacute osteomyelitis of right foot ([place])     Plan: Will set up a CT scan of the right forefoot to evaluate for osteomyelitis of the third metatarsal head.  Follow-Up Instructions: Return in about 2 weeks (around 09/12/2020).   Ortho Exam  Patient is alert, oriented, no adenopathy, well-dressed, normal affect, normal respiratory effort. Examination the ulcer beneath the third metatarsal head is deeper and probes to bone the surrounding soft tissue was debrided with a 10 blade knife there was good petechial bleeding there is no purulent drainage.  Radiograph showed no destructive bony changes.  Imaging: No results found. No images are attached to the encounter.  Labs: Lab Results  Component Value Date   HGBA1C 7.0 (H) 01/31/2019   HGBA1C 6.4 (H) 12/13/2017   HGBA1C 6.4 (H) 12/09/2016   REPTSTATUS 02/04/2019 FINAL 01/30/2019   CULT  01/30/2019    NO GROWTH 5 DAYS Performed at [facility], [place] [address], [place], [place] [address]      Lab Results  Component Value Date   ALBUMIN 2.3 (L) 02/06/2019   ALBUMIN 2.2 (L) 02/05/2019   ALBUMIN 2.2 (L) 02/04/2019    Lab Results  Component Value Date   MG 2.1 02/06/2019    MG 2.3 02/05/2019   MG 2.3 02/04/2019   No results found for: VD25OH  No results found for: PREALBUMIN CBC EXTENDED Latest Ref Rng & Units 08/02/2019 02/10/2019 02/09/2019  WBC 4.0 - 10.5 K/uL 4.9 6.0 6.6  RBC 4.22 - 5.81 MIL/uL 4.19(L) 2.95(L) 2.92(L)  HGB 13.0 - 17.0 g/dL 12.1(L) 8.7(L) 8.7(L)  HCT 39.0 - 52.0 % 38.5(L) 27.6(L) 27.1(L)  PLT 150 - 400 K/uL 158 336 325  NEUTROABS 1.7 - 7.7 K/uL - - -  LYMPHSABS 0.7 - 4.0 K/uL - - -     Body mass index is 31.19 kg/m.  Orders:  Orders Placed This Encounter  Procedures   XR Foot Complete Right   MR Foot Right w/o contrast   No orders of the defined types were placed in this encounter.    Procedures: No procedures performed  Clinical Data: No additional findings.  ROS:  All other systems negative, except as noted in the HPI. Review of Systems  Objective: Vital Signs: Ht 6' (1.829 m)   Wt 230 lb (104.3 kg)   BMI 31.19 kg/m   Specialty Comments:  No specialty comments available.  PMFS History: Patient Active Problem List   Diagnosis Date Noted   Right foot ulcer,  limited to breakdown of skin ([place]) 02/28/2020   Osteomyelitis of second toe of right foot ([place])    Osteomyelitis of great toe of right foot ([place])    Severe protein-calorie malnutrition ([place])    Diabetic polyneuropathy associated with type 2 diabetes mellitus ([place])    Cutaneous abscess of right foot    Cellulitis of right foot 01/30/2019   CKD (chronic kidney disease), stage III ([place]) 01/30/2019   Elevated LFTs 01/30/2019   Colon cancer ([place]) 01/30/2019   Anemia of chronic disease 01/30/2019   Acute on chronic combined systolic and diastolic CHF (congestive heart failure) ([place]) 03/09/2018   AKI (acute kidney injury) ([place]) 03/09/2018   Colonic mass 12/10/2017   S/P TAVR (transcatheter aortic valve replacement) 03/03/2017   Severe aortic stenosis 12/15/2016   Essential hypertension 05/09/2013   Hyperlipidemia 05/09/2013   PVC's (premature  ventricular contractions) 05/09/2013   Chronic combined systolic and diastolic heart failure ([place]) 05/09/2013   Past Medical History:  Diagnosis Date   Anemia    Cancer ([place]) 2019   colon- colectomy    CHF (congestive heart failure) ([place])    Diabetes mellitus without complication ([place])    Type II   Dyslipidemia 10/27/2015   Dyspnea    w/ exertion    Elevated PSA 10/27/2015   Erectile dysfunction 10/27/2015   Heart murmur    Hypertension 10/27/2015   Hypogonadism male 10/27/2015   Obesity 10/27/2015   Peripheral vascular disease ([place])    Pneumonia 10/27/2015   pt states was 1982   Rotator cuff tear 10/27/2015    Family History  Problem Relation Age of Onset   Diabetes Mother    Heart disease Mother    Pulmonary embolism Father     Past Surgical History:  Procedure Laterality Date   ABDOMINAL AORTOGRAM N/A 02/01/2019   Procedure: ABDOMINAL AORTOGRAM;  Surgeon: Marty Heck, MD;  Location: [place] [facility];  Service: Cardiovascular;  Laterality: N/A;   AMPUTATION Right 02/03/2019   Procedure: RIGHT FOOT 1ST RAY AMPUTATION;  Surgeon: [name], MD;  Location: [place];  Service: Orthopedics;  Laterality: Right;   AMPUTATION Right 08/02/2019   Procedure: RIGHT 2ND TOE AMPUTATION;  Surgeon: [name], MD;  Location: [place];  Service: Orthopedics;  Laterality: Right;   CARDIAC CATHETERIZATION N/A 11/14/2015   Procedure: Left Heart Cath and Coronary Angiography;  Surgeon: [name], MD;  Location: [place] [facility];  Service: Cardiovascular;  Laterality: N/A;   COLONOSCOPY     LAPAROSCOPIC PARTIAL COLECTOMY  12/15/2017   LAPAROSCOPIC PARTIAL COLECTOMY (N/A Abdomen)   LAPAROSCOPIC PARTIAL COLECTOMY N/A 12/15/2017   Procedure: LAPAROSCOPIC PARTIAL COLECTOMY;  Surgeon: Erroll Luna, MD;  Location: [place];  Service: General;  Laterality: N/A;   LOWER EXTREMITY ANGIOGRAPHY Right 02/01/2019   Procedure: LOWER EXTREMITY ANGIOGRAPHY;  Surgeon: Marty Heck, MD;   Location: [place];  Service: Cardiovascular;  Laterality: Right;   PERIPHERAL VASCULAR INTERVENTION Right 02/01/2019   Procedure: PERIPHERAL VASCULAR INTERVENTION;  Surgeon: Marty Heck, MD;  Location: [facility];  Service: Cardiovascular;  Laterality: Right;  SFA   TEE WITHOUT CARDIOVERSION N/A 12/15/2016   Procedure: TRANSESOPHAGEAL ECHOCARDIOGRAM (TEE);  Surgeon: Sherren Mocha, MD;  Location: [facility];  Service: Open Heart Surgery;  Laterality: N/A;   TRANSCATHETER AORTIC VALVE REPLACEMENT, TRANSFEMORAL N/A 12/15/2016   Procedure: TRANSCATHETER AORTIC VALVE REPLACEMENT, TRANSFEMORAL;  Surgeon: Sherren Mocha, MD;  Location: [facility];  Service: Open Heart Surgery;  Laterality:  N/A;   Social History   Occupational History   Not on file  Tobacco Use   Smoking status: Former    Pack years: 0.00    Types: Cigarettes   Smokeless tobacco: Never  Vaping Use   Vaping Use: Never used  Substance and Sexual Activity   Alcohol use: Yes    Comment: rarely   Drug use: No   Sexual activity: Not on file

## 2020-09-14 ENCOUNTER — Ambulatory Visit
Admission: RE | Admit: 2020-09-14 | Discharge: 2020-09-14 | Disposition: A | Discharge status: Home / Self Care | Payer: Medicare HMO | Source: Ambulatory Visit | Attending: Orthopedic Surgery | Admitting: Orthopedic Surgery

## 2020-09-14 ENCOUNTER — Other Ambulatory Visit: Payer: Self-pay

## 2020-09-14 DIAGNOSIS — R600 Localized edema: Secondary | ICD-10-CM | POA: Diagnosis not present

## 2020-09-14 DIAGNOSIS — L97519 Non-pressure chronic ulcer of other part of right foot with unspecified severity: Secondary | ICD-10-CM | POA: Diagnosis not present

## 2020-09-14 DIAGNOSIS — M86271 Subacute osteomyelitis, right ankle and foot: Secondary | ICD-10-CM

## 2020-09-17 ENCOUNTER — Other Ambulatory Visit: Payer: Self-pay

## 2020-09-17 ENCOUNTER — Other Ambulatory Visit: Payer: Medicare HMO

## 2020-09-17 ENCOUNTER — Inpatient Hospital Stay: Payer: Medicare HMO | Attending: Oncology | Admitting: Oncology

## 2020-09-17 ENCOUNTER — Inpatient Hospital Stay: Payer: Medicare HMO

## 2020-09-17 VITALS — BP 125/84 | HR 88 | Temp 98.2°F | Resp 20 | Ht 72.0 in | Wt 232.0 lb

## 2020-09-17 DIAGNOSIS — Z85038 Personal history of other malignant neoplasm of large intestine: Secondary | ICD-10-CM | POA: Diagnosis not present

## 2020-09-17 DIAGNOSIS — C184 Malignant neoplasm of transverse colon: Secondary | ICD-10-CM

## 2020-09-17 LAB — CEA (ACCESS): CEA (CHCC): 1.93 ng/mL (ref 0.00–5.00)

## 2020-09-17 NOTE — Progress Notes (Signed)
[facility] OFFICE PROGRESS NOTE   Diagnosis: Colon cancer  INTERVAL HISTORY:   Robert Horn returns as scheduled.  Good appetite.  No difficulty with bowel or bladder function.  He is followed by Dr. Sharol Given for management of a nonhealing ulcer and possible osteomyelitis at the right third metatarsal.  Objective:  Vital signs in last 24 hours:  Blood pressure 125/84, pulse 88, temperature 98.2 F (36.8 C), resp. rate 20, height 6' (1.829 m), weight 232 lb (105.2 kg), SpO2 96 %.    Lymphatics: No cervical, supraclavicular, axillary, or inguinal nodes Resp: Lungs clear bilaterally Cardio: Regular rate and rhythm GI: No hepatosplenomegaly, nontender, no mass Vascular: No leg edema   Lab Results:  Lab Results  Component Value Date   WBC 4.9 08/02/2019   HGB 12.1 (L) 08/02/2019   HCT 38.5 (L) 08/02/2019   MCV 91.9 08/02/2019   PLT 158 08/02/2019   NEUTROABS 9.0 (H) 01/30/2019    CMP  Lab Results  Component Value Date   NA 138 08/02/2019   K 4.3 08/02/2019   CL 109 08/02/2019   CO2 21 (L) 08/02/2019   GLUCOSE 101 (H) 08/02/2019   BUN 24 (H) 08/02/2019   CREATININE 1.51 (H) 08/02/2019   CALCIUM 9.0 08/02/2019   PROT 6.9 02/06/2019   ALBUMIN 2.3 (L) 02/06/2019   AST 23 02/06/2019   ALT 28 02/06/2019   ALKPHOS 106 02/06/2019   BILITOT 0.7 02/06/2019   GFRNONAA 42 (L) 08/02/2019   GFRAA 49 (L) 08/02/2019    Lab Results  Component Value Date   CEA1 2.60 03/18/2020    Imaging:  MR Foot Right w/o contrast  Result Date: 09/16/2020 CLINICAL DATA:  Chronic foot ulcer. Ulcer underneath the third metatarsal head. EXAM: MRI OF THE RIGHT FOREFOOT WITHOUT CONTRAST TECHNIQUE: Multiplanar, multisequence MR imaging of the right forefoot was performed. No intravenous contrast was administered. COMPARISON:  None. FINDINGS: Bones/Joint/Cartilage Prior amputation of the first ray and second phalanges. Soft tissue ulcer along the plantar aspect of the third  metatarsal head. Subcortical bone marrow edema within the third metatarsal head concerning for osteomyelitis versus reactive marrow edema. Moderate-severe osteoarthritis of the second tarsometatarsal joint with subchondral reactive marrow changes. No other marrow signal abnormality. Normal alignment. No joint effusion. Ligaments Collateral ligaments are intact.  Lisfranc ligament is intact. Muscles and Tendons Flexor, peroneal and extensor compartment tendons are intact. Generalized muscle atrophy of the plantar musculature. Soft tissue No fluid collection or hematoma.  No soft tissue mass. IMPRESSION: Soft tissue ulcer along the plantar aspect of the third metatarsal head. Subcortical bone marrow edema within the third metatarsal head concerning for osteomyelitis versus reactive marrow edema. No focal fluid collection to suggest an abscess. Electronically Signed   By: Kathreen Devoid   On: 09/16/2020 11:13    Medications: I have reviewed the patient's current medications.   Assessment/Plan: Colon cancer, transverse, stage II (T3N0), status post a right and transverse colectomy 12/15/2017 0/25 lymph nodes positive, no lymphovascular or perineural invasion MSI stable, no loss of mismatch repair protein expression CTs 12/10/2017- transverse colon wall thickening, 8 mm transverse mesocolon lymph node, no evidence of metastatic disease, stable small bilateral pulmonary nodules Colonoscopy 12/09/2017-transverse colon mass, sessile polyps in the descending colon-not removed Colonoscopy 01/13/2019-no polyps, healthy appearing mucosa at the colocolonic anastomosis Polyps on the 12/15/2017 resection specimen- tubular adenomas Enlarged prostate with elevated PSA Status post TAVR 12/15/2016 Hypertension Pneumonia 1982 History of CHF Right first and second toe amputations secondary to  osteomyelitis November 2020 in May 2021, right third metatarsal head ulcer/osteomyelitis followed by  orthopedics     Disposition: Robert Horn remains in clinical remission from colon cancer.  He is almost 3 years out from diagnosis.  We will follow-up on the CEA from today.  He will return for an office visit in 8 months.  He will continue follow-up with Dr. Sharol Given for management of the right foot ulcer.  He underwent a colonoscopy in October 2020.  He will discuss the indication for additional colonoscopy surveillance with Dr. Michail Sermon.  Betsy Coder, MD  09/17/2020  12:09 PM

## 2020-09-18 LAB — CEA (IN HOUSE-CHCC): CEA (CHCC-In House): 2.78 ng/mL (ref 0.00–5.00)

## 2020-09-23 ENCOUNTER — Telehealth: Payer: Self-pay

## 2020-09-23 NOTE — Telephone Encounter (Signed)
Called spoke with pt wife made her aware of most recent CEA results she will make pt aware also encouraged pt to call for any questions changes or concerns

## 2020-09-23 NOTE — Telephone Encounter (Signed)
-----   Message from Ladell Pier, MD sent at 09/17/2020  1:23 PM EDT ----- Please call patient, CEA is normal, follow-up as scheduled

## 2020-09-26 ENCOUNTER — Encounter: Payer: Self-pay | Admitting: Orthopedic Surgery

## 2020-09-29 ENCOUNTER — Other Ambulatory Visit: Payer: Self-pay | Admitting: Interventional Cardiology

## 2020-10-03 ENCOUNTER — Encounter: Payer: Self-pay | Admitting: Orthopedic Surgery

## 2020-10-03 ENCOUNTER — Ambulatory Visit (INDEPENDENT_AMBULATORY_CARE_PROVIDER_SITE_OTHER): Payer: Medicare HMO | Admitting: Orthopedic Surgery

## 2020-10-03 DIAGNOSIS — M86271 Subacute osteomyelitis, right ankle and foot: Secondary | ICD-10-CM | POA: Diagnosis not present

## 2020-10-04 ENCOUNTER — Other Ambulatory Visit: Payer: Self-pay | Admitting: Orthopedic Surgery

## 2020-10-04 ENCOUNTER — Telehealth: Payer: Self-pay

## 2020-10-04 MED ORDER — DOXYCYCLINE HYCLATE 100 MG PO TABS: 100.0000 mg | ORAL_TABLET | Freq: Two times a day (BID) | ORAL | 0 refills | Status: DC

## 2020-10-04 NOTE — Progress Notes (Unsigned)
I called the patient's wife and discussed the osteomyelitis of the third metatarsal head.  Recommended at a minimum resection of the infected bone ideally a transmetatarsal amputation to improve weightbearing and ambulation and minimize risk of future ulceration.  Patient and his wife wish to consider their options I have encouraged a second opinion.  Prescription called in for doxycycline.  I will follow-up in 3 weeks.

## 2020-10-04 NOTE — Telephone Encounter (Signed)
TC from [place] message left from [facility] [phone] in reference to medical records needed for Pt. Spoke with representative from [facility] who stated they needed a updated medication list.Gave updated fax number to receive forms.

## 2020-10-04 NOTE — Telephone Encounter (Signed)
Message sent in mychart please see below and call pt's wife to discuss  Dr. Sharol Given,  [name] shared with me the conversation from his visit with you today... he indicated that the MRI revealed infection in the bone and that no healing progress has taken place.  Since infection is indicaated, what medication can be done to assure that this does not lead to greater problems.  I'm assuming some type of antibotic can help... please advise. I would appreciate a next step response as your convenience. Best Regards. Gwynneth Munson

## 2020-10-07 DIAGNOSIS — L97511 Non-pressure chronic ulcer of other part of right foot limited to breakdown of skin: Secondary | ICD-10-CM | POA: Diagnosis not present

## 2020-10-07 DIAGNOSIS — N401 Enlarged prostate with lower urinary tract symptoms: Secondary | ICD-10-CM | POA: Diagnosis not present

## 2020-10-07 DIAGNOSIS — R937 Abnormal findings on diagnostic imaging of other parts of musculoskeletal system: Secondary | ICD-10-CM | POA: Diagnosis not present

## 2020-10-07 DIAGNOSIS — R198 Other specified symptoms and signs involving the digestive system and abdomen: Secondary | ICD-10-CM | POA: Diagnosis not present

## 2020-10-08 ENCOUNTER — Encounter: Payer: Self-pay | Admitting: *Deleted

## 2020-10-08 NOTE — Progress Notes (Signed)
Received faxed request from [facility] for records. Emailed request to HIM adding MRN to allow patient location. Requested a receipt confirmation.

## 2020-10-10 DIAGNOSIS — I10 Essential (primary) hypertension: Secondary | ICD-10-CM | POA: Diagnosis not present

## 2020-10-10 DIAGNOSIS — E7800 Pure hypercholesterolemia, unspecified: Secondary | ICD-10-CM | POA: Diagnosis not present

## 2020-10-10 DIAGNOSIS — N401 Enlarged prostate with lower urinary tract symptoms: Secondary | ICD-10-CM | POA: Diagnosis not present

## 2020-10-10 DIAGNOSIS — I5040 Unspecified combined systolic (congestive) and diastolic (congestive) heart failure: Secondary | ICD-10-CM | POA: Diagnosis not present

## 2020-10-11 ENCOUNTER — Encounter: Payer: Self-pay | Admitting: Oncology

## 2020-10-14 ENCOUNTER — Encounter: Payer: Self-pay | Admitting: *Deleted

## 2020-10-15 ENCOUNTER — Encounter: Payer: Self-pay | Admitting: Orthopedic Surgery

## 2020-10-15 NOTE — Progress Notes (Signed)
Office Visit Note   Patient: Robert Horn           Date of Birth: [birth year]           MRN: [id number] Visit Date: 10/03/2020              Requested by: [name], MD [address] [place],  [place] [address] PCP: [name], MD  Chief Complaint  Patient presents with   Right Foot - Follow-up      HPI: Patient is an 87 year old gentleman who presents in follow-up for ulceration right foot beneath the third metatarsal head he is status post an MRI scan.  Assessment & Plan: Visit Diagnoses:  1. Subacute osteomyelitis of right foot (HCC)     Plan: Recommended either a transmetatarsal amputation to even the distribution of pressure across the metatarsal heads versus a resection of the third metatarsal head that is the isolated area of osteomyelitis.  Patient states he does not want to pursue further amputation surgery I have recommended that he obtain consultation with [facility] for a second opinion.  Follow-Up Instructions: Return in about 3 weeks (around 10/24/2020).   Ortho Exam  Patient is alert, oriented, no adenopathy, well-dressed, normal affect, normal respiratory effort. Examination patient has a good dorsalis pedis pulse.  He has a chronic ulcer beneath the third metatarsal head that probes to bone.  There is no ascending cellulitis no purulent drainage.  Review of the MRI scan shows edema within the third metatarsal head consistent with osteomyelitis.  Imaging: No results found. No images are attached to the encounter.  Labs: Lab Results  Component Value Date   HGBA1C 7.0 (H) 01/31/2019   HGBA1C 6.4 (H) 12/13/2017   HGBA1C 6.4 (H) 12/09/2016   REPTSTATUS 02/04/2019 FINAL 01/30/2019   CULT  01/30/2019    NO GROWTH 5 DAYS Performed at [facility], [place] [address] [place], [place] [address]      Lab Results  Component Value Date   ALBUMIN 2.3 (L) 02/06/2019   ALBUMIN 2.2 (L) 02/05/2019   ALBUMIN 2.2 (L) 02/04/2019    Lab  Results  Component Value Date   MG 2.1 02/06/2019   MG 2.3 02/05/2019   MG 2.3 02/04/2019   No results found for: VD25OH  No results found for: PREALBUMIN CBC EXTENDED Latest Ref Rng & Units 08/02/2019 02/10/2019 02/09/2019  WBC 4.0 - 10.5 K/uL 4.9 6.0 6.6  RBC 4.22 - 5.81 MIL/uL 4.19(L) 2.95(L) 2.92(L)  HGB 13.0 - 17.0 g/dL 12.1(L) 8.7(L) 8.7(L)  HCT 39.0 - 52.0 % 38.5(L) 27.6(L) 27.1(L)  PLT 150 - 400 K/uL 158 336 325  NEUTROABS 1.7 - 7.7 K/uL - - -  LYMPHSABS 0.7 - 4.0 K/uL - - -     There is no height or weight on file to calculate BMI.  Orders:  No orders of the defined types were placed in this encounter.  No orders of the defined types were placed in this encounter.    Procedures: No procedures performed  Clinical Data: No additional findings.  ROS:  All other systems negative, except as noted in the HPI. Review of Systems  Objective: Vital Signs: There were no vitals taken for this visit.  Specialty Comments:  No specialty comments available.  PMFS History: Patient Active Problem List   Diagnosis Date Noted   Right foot ulcer, limited to breakdown of skin ([place]) 02/28/2020   Osteomyelitis of second toe of right foot ([place])  Osteomyelitis of great toe of right foot (HCC)    Severe protein-calorie malnutrition ([place])    Diabetic polyneuropathy associated with type 2 diabetes mellitus ([place])    Cutaneous abscess of right foot    Cellulitis of right foot 01/30/2019   CKD (chronic kidney disease), stage III ([place]) 01/30/2019   Elevated LFTs 01/30/2019   Colon cancer ([place]) 01/30/2019   Anemia of chronic disease 01/30/2019   Acute on chronic combined systolic and diastolic CHF (congestive heart failure) ([place]) 03/09/2018   AKI (acute kidney injury) ([place]) 03/09/2018   Colonic mass 12/10/2017   S/P TAVR (transcatheter aortic valve replacement) 03/03/2017   Severe aortic stenosis 12/15/2016   Essential hypertension 05/09/2013   Hyperlipidemia 05/09/2013    PVC's (premature ventricular contractions) 05/09/2013   Chronic combined systolic and diastolic heart failure ([place]) 05/09/2013   Past Medical History:  Diagnosis Date   Anemia    Cancer ([place]) 2019   colon- colectomy    CHF (congestive heart failure) ([place])    Diabetes mellitus without complication ([place])    Type II   Dyslipidemia 10/27/2015   Dyspnea    w/ exertion    Elevated PSA 10/27/2015   Erectile dysfunction 10/27/2015   Heart murmur    Hypertension 10/27/2015   Hypogonadism male 10/27/2015   Obesity 10/27/2015   Peripheral vascular disease ([place])    Pneumonia 10/27/2015   pt states was 1982   Rotator cuff tear 10/27/2015    Family History  Problem Relation Age of Onset   Diabetes Mother    Heart disease Mother    Pulmonary embolism Father     Past Surgical History:  Procedure Laterality Date   ABDOMINAL AORTOGRAM N/A 02/01/2019   Procedure: ABDOMINAL AORTOGRAM;  Surgeon: Marty Heck, MD;  Location: [facility];  Service: Cardiovascular;  Laterality: N/A;   AMPUTATION Right 02/03/2019   Procedure: RIGHT FOOT 1ST RAY AMPUTATION;  Surgeon: [name], MD;  Location: [facility];  Service: Orthopedics;  Laterality: Right;   AMPUTATION Right 08/02/2019   Procedure: RIGHT 2ND TOE AMPUTATION;  Surgeon: [name], MD;  Location: [facility];  Service: Orthopedics;  Laterality: Right;   CARDIAC CATHETERIZATION N/A 11/14/2015   Procedure: Left Heart Cath and Coronary Angiography;  Surgeon: [name], MD;  Location: [facility];  Service: Cardiovascular;  Laterality: N/A;   COLONOSCOPY     LAPAROSCOPIC PARTIAL COLECTOMY  12/15/2017   LAPAROSCOPIC PARTIAL COLECTOMY (N/A Abdomen)   LAPAROSCOPIC PARTIAL COLECTOMY N/A 12/15/2017   Procedure: LAPAROSCOPIC PARTIAL COLECTOMY;  Surgeon: Erroll Luna, MD;  Location: [facility];  Service: General;  Laterality: N/A;   LOWER EXTREMITY ANGIOGRAPHY Right 02/01/2019   Procedure: LOWER EXTREMITY ANGIOGRAPHY;  Surgeon: Marty Heck, MD;  Location: [facility];  Service: Cardiovascular;  Laterality: Right;   PERIPHERAL VASCULAR INTERVENTION Right 02/01/2019   Procedure: PERIPHERAL VASCULAR INTERVENTION;  Surgeon: Marty Heck, MD;  Location: [facility];  Service: Cardiovascular;  Laterality: Right;  SFA   TEE WITHOUT CARDIOVERSION N/A 12/15/2016   Procedure: TRANSESOPHAGEAL ECHOCARDIOGRAM (TEE);  Surgeon: Sherren Mocha, MD;  Location: [facility];  Service: Open Heart Surgery;  Laterality: N/A;   TRANSCATHETER AORTIC VALVE REPLACEMENT, TRANSFEMORAL N/A 12/15/2016   Procedure: TRANSCATHETER AORTIC VALVE REPLACEMENT, TRANSFEMORAL;  Surgeon: Sherren Mocha, MD;  Location: [facility];  Service: Open Heart Surgery;  Laterality: N/A;   Social History   Occupational History   Not on file  Tobacco Use   Smoking  status: Former    Types: Cigarettes   Smokeless tobacco: Never  Vaping Use   Vaping Use: Never used  Substance and Sexual Activity   Alcohol use: Yes    Comment: rarely   Drug use: No   Sexual activity: Not on file

## 2020-10-24 ENCOUNTER — Ambulatory Visit: Payer: Medicare HMO | Admitting: Orthopedic Surgery

## 2020-10-24 ENCOUNTER — Other Ambulatory Visit: Payer: Self-pay

## 2020-10-24 DIAGNOSIS — I10 Essential (primary) hypertension: Secondary | ICD-10-CM | POA: Diagnosis not present

## 2020-10-24 DIAGNOSIS — C184 Malignant neoplasm of transverse colon: Secondary | ICD-10-CM | POA: Diagnosis not present

## 2020-10-24 DIAGNOSIS — E1159 Type 2 diabetes mellitus with other circulatory complications: Secondary | ICD-10-CM | POA: Diagnosis not present

## 2020-10-24 DIAGNOSIS — M86271 Subacute osteomyelitis, right ankle and foot: Secondary | ICD-10-CM

## 2020-10-24 DIAGNOSIS — E1169 Type 2 diabetes mellitus with other specified complication: Secondary | ICD-10-CM | POA: Diagnosis not present

## 2020-10-24 DIAGNOSIS — E7800 Pure hypercholesterolemia, unspecified: Secondary | ICD-10-CM | POA: Diagnosis not present

## 2020-10-24 DIAGNOSIS — N1831 Chronic kidney disease, stage 3a: Secondary | ICD-10-CM | POA: Diagnosis not present

## 2020-10-24 DIAGNOSIS — I5040 Unspecified combined systolic (congestive) and diastolic (congestive) heart failure: Secondary | ICD-10-CM | POA: Diagnosis not present

## 2020-10-24 DIAGNOSIS — C182 Malignant neoplasm of ascending colon: Secondary | ICD-10-CM | POA: Diagnosis not present

## 2020-10-24 DIAGNOSIS — D509 Iron deficiency anemia, unspecified: Secondary | ICD-10-CM | POA: Diagnosis not present

## 2020-10-24 DIAGNOSIS — I130 Hypertensive heart and chronic kidney disease with heart failure and stage 1 through stage 4 chronic kidney disease, or unspecified chronic kidney disease: Secondary | ICD-10-CM | POA: Diagnosis not present

## 2020-10-29 ENCOUNTER — Encounter: Payer: Self-pay | Admitting: Orthopedic Surgery

## 2020-10-29 NOTE — Progress Notes (Signed)
Office Visit Note   Patient: Robert Horn           Date of Birth: 1933/11/01           MRN: [id number] Visit Date: 10/24/2020              Requested by: [name], MD [address] [place],  [place] [address] PCP: [name], MD  Chief Complaint  Patient presents with   Right Foot - Pain      HPI: Patient is an 87 year old gentleman is seen in follow-up for osteomyelitis right forefoot.  He is currently on doxycycline he has had radiographs and MRI scan showing the osteomyelitis.  He is currently full weightbearing in regular shoewear.  Assessment & Plan: Visit Diagnoses:  1. Subacute osteomyelitis of right foot ([place])     Plan: Discussed with the patient and his wife recommendation to proceed with a transmetatarsal amputation.  Risk and benefits were discussed including risk of the wound not healing with weightbearing.  Patient states he understands wished to proceed at this time wants to proceed with general anesthesia.  Patient states he wished to proceed with surgery on Friday, August 12.  We will plan for 23-hour observation.  Follow-Up Instructions: Return in about 3 weeks (around 11/14/2020).   Ortho Exam  Patient is alert, oriented, no adenopathy, well-dressed, normal affect, normal respiratory effort. Examination patient has a persistent ulcer that probes to bone there is no ascending cellulitis no crepitation no purulent drainage.  Imaging: No results found. No images are attached to the encounter.  Labs: Lab Results  Component Value Date   HGBA1C 7.0 (H) 01/31/2019   HGBA1C 6.4 (H) 12/13/2017   HGBA1C 6.4 (H) 12/09/2016   REPTSTATUS 02/04/2019 FINAL 01/30/2019   CULT  01/30/2019    NO GROWTH 5 DAYS Performed at [facility], [place] [address], [place], [place] [address]      Lab Results  Component Value Date   ALBUMIN 2.3 (L) 02/06/2019   ALBUMIN 2.2 (L) 02/05/2019   ALBUMIN 2.2 (L) 02/04/2019    Lab Results  Component  Value Date   MG 2.1 02/06/2019   MG 2.3 02/05/2019   MG 2.3 02/04/2019   No results found for: VD25OH  No results found for: PREALBUMIN CBC EXTENDED Latest Ref Rng & Units 08/02/2019 02/10/2019 02/09/2019  WBC 4.0 - 10.5 K/uL 4.9 6.0 6.6  RBC 4.22 - 5.81 MIL/uL 4.19(L) 2.95(L) 2.92(L)  HGB 13.0 - 17.0 g/dL 12.1(L) 8.7(L) 8.7(L)  HCT 39.0 - 52.0 % 38.5(L) 27.6(L) 27.1(L)  PLT 150 - 400 K/uL 158 336 325  NEUTROABS 1.7 - 7.7 K/uL - - -  LYMPHSABS 0.7 - 4.0 K/uL - - -     There is no height or weight on file to calculate BMI.  Orders:  No orders of the defined types were placed in this encounter.  No orders of the defined types were placed in this encounter.    Procedures: No procedures performed  Clinical Data: No additional findings.  ROS:  All other systems negative, except as noted in the HPI. Review of Systems  Objective: Vital Signs: There were no vitals taken for this visit.  Specialty Comments:  No specialty comments available.  PMFS History: Patient Active Problem List   Diagnosis Date Noted   Right foot ulcer, limited to breakdown of skin ([place]) 02/28/2020   Osteomyelitis of second toe of right foot (HCC)    Osteomyelitis of great toe of  right foot ([place])    Severe protein-calorie malnutrition ([place])    Diabetic polyneuropathy associated with type 2 diabetes mellitus ([place])    Cutaneous abscess of right foot    Cellulitis of right foot 01/30/2019   CKD (chronic kidney disease), stage III ([place]) 01/30/2019   Elevated LFTs 01/30/2019   Colon cancer ([place]) 01/30/2019   Anemia of chronic disease 01/30/2019   Acute on chronic combined systolic and diastolic CHF (congestive heart failure) ([place]) 03/09/2018   AKI (acute kidney injury) ([place]) 03/09/2018   Colonic mass 12/10/2017   S/P TAVR (transcatheter aortic valve replacement) 03/03/2017   Severe aortic stenosis 12/15/2016   Essential hypertension 05/09/2013   Hyperlipidemia 05/09/2013   PVC's (premature  ventricular contractions) 05/09/2013   Chronic combined systolic and diastolic heart failure ([place]) 05/09/2013   Past Medical History:  Diagnosis Date   Anemia    Cancer ([place]) 2019   colon- colectomy    CHF (congestive heart failure) ([place])    Diabetes mellitus without complication ([place])    Type II   Dyslipidemia 10/27/2015   Dyspnea    w/ exertion    Elevated PSA 10/27/2015   Erectile dysfunction 10/27/2015   Heart murmur    Hypertension 10/27/2015   Hypogonadism male 10/27/2015   Obesity 10/27/2015   Peripheral vascular disease ([place])    Pneumonia 10/27/2015   pt states was 1982   Rotator cuff tear 10/27/2015    Family History  Problem Relation Age of Onset   Diabetes Mother    Heart disease Mother    Pulmonary embolism Father     Past Surgical History:  Procedure Laterality Date   ABDOMINAL AORTOGRAM N/A 02/01/2019   Procedure: ABDOMINAL AORTOGRAM;  Surgeon: Marty Heck, MD;  Location: [facility];  Service: Cardiovascular;  Laterality: N/A;   AMPUTATION Right 02/03/2019   Procedure: RIGHT FOOT 1ST RAY AMPUTATION;  Surgeon: [name], MD;  Location: [facility];  Service: Orthopedics;  Laterality: Right;   AMPUTATION Right 08/02/2019   Procedure: RIGHT 2ND TOE AMPUTATION;  Surgeon: [name], MD;  Location: [facility];  Service: Orthopedics;  Laterality: Right;   CARDIAC CATHETERIZATION N/A 11/14/2015   Procedure: Left Heart Cath and Coronary Angiography;  Surgeon: [name], MD;  Location: [facility];  Service: Cardiovascular;  Laterality: N/A;   COLONOSCOPY     LAPAROSCOPIC PARTIAL COLECTOMY  12/15/2017   LAPAROSCOPIC PARTIAL COLECTOMY (N/A Abdomen)   LAPAROSCOPIC PARTIAL COLECTOMY N/A 12/15/2017   Procedure: LAPAROSCOPIC PARTIAL COLECTOMY;  Surgeon: Erroll Luna, MD;  Location: [facility];  Service: General;  Laterality: N/A;   LOWER EXTREMITY ANGIOGRAPHY Right 02/01/2019   Procedure: LOWER EXTREMITY ANGIOGRAPHY;  Surgeon: Marty Heck, MD;   Location: [facility];  Service: Cardiovascular;  Laterality: Right;   PERIPHERAL VASCULAR INTERVENTION Right 02/01/2019   Procedure: PERIPHERAL VASCULAR INTERVENTION;  Surgeon: Marty Heck, MD;  Location: [facility];  Service: Cardiovascular;  Laterality: Right;  SFA   TEE WITHOUT CARDIOVERSION N/A 12/15/2016   Procedure: TRANSESOPHAGEAL ECHOCARDIOGRAM (TEE);  Surgeon: Sherren Mocha, MD;  Location: [facility];  Service: Open Heart Surgery;  Laterality: N/A;   TRANSCATHETER AORTIC VALVE REPLACEMENT, TRANSFEMORAL N/A 12/15/2016   Procedure: TRANSCATHETER AORTIC VALVE REPLACEMENT, TRANSFEMORAL;  Surgeon: Sherren Mocha, MD;  Location: [facility];  Service: Open Heart Surgery;  Laterality: N/A;   Social History   Occupational History   Not on file  Tobacco Use   Smoking status: Former  Types: Cigarettes   Smokeless tobacco: Never  Vaping Use   Vaping Use: Never used  Substance and Sexual Activity   Alcohol use: Yes    Comment: rarely   Drug use: No   Sexual activity: Not on file

## 2020-10-30 ENCOUNTER — Telehealth: Payer: Self-pay | Admitting: Interventional Cardiology

## 2020-10-30 NOTE — Telephone Encounter (Signed)
Thanks for letting me know. Sorry to hear more surgery is needed. I will follow along.

## 2020-10-30 NOTE — Telephone Encounter (Signed)
  Per Patient schedule MyChart message:    Please share with Dr. [name] that Dr. Sharol Given has determined that more surgery is needed to promote healing of ulcer on right foot.  The surgery is scheduled for August 12th and  patient will be in the hospital for 24 hours.  Since we have been unable to see Dr. [name], we would like to at least keep him updated as to treatment plan.   Regards. Barnabas/[employer]

## 2020-10-31 ENCOUNTER — Other Ambulatory Visit: Payer: Self-pay | Admitting: Orthopedic Surgery

## 2020-11-04 ENCOUNTER — Encounter: Payer: Self-pay | Admitting: Orthopedic Surgery

## 2020-11-05 ENCOUNTER — Other Ambulatory Visit: Payer: Self-pay

## 2020-11-06 ENCOUNTER — Other Ambulatory Visit: Payer: Self-pay | Admitting: Physician Assistant

## 2020-11-07 ENCOUNTER — Other Ambulatory Visit: Payer: Self-pay

## 2020-11-07 ENCOUNTER — Encounter (HOSPITAL_COMMUNITY): Payer: Self-pay | Admitting: Orthopedic Surgery

## 2020-11-07 NOTE — Anesthesia Preprocedure Evaluation (Addendum)
Anesthesia Evaluation  Patient identified by MRN, date of birth, ID band Patient awake    Reviewed: Allergy & Precautions, NPO status , Patient's Chart, lab work & pertinent test results, reviewed documented beta blocker date and time   History of Anesthesia Complications Negative for: history of anesthetic complications  Airway Mallampati: II  TM Distance: >3 FB Neck ROM: Full    Dental  (+) Edentulous Upper, Partial Lower   Pulmonary asthma , former smoker,    Pulmonary exam normal        Cardiovascular hypertension, Pt. on medications and Pt. on home beta blockers + CAD (on Plavix) and + Peripheral Vascular Disease  Normal cardiovascular exam  Severe AS s/p TAVR 2018  TTE 2020: EF 45-50%, grade II DD, mild LAE, bioprosthetic aortic valve present    Neuro/Psych Anxiety    GI/Hepatic negative GI ROS, Neg liver ROS,   Endo/Other  diabetes, Type 2, Oral Hypoglycemic Agents  Renal/GU Renal InsufficiencyRenal disease  negative genitourinary   Musculoskeletal Osteomyelitis Right Foot   Abdominal   Peds  Hematology  (+) anemia ,   Anesthesia Other Findings Day of surgery medications reviewed with patient.  Reproductive/Obstetrics negative OB ROS                           Anesthesia Physical Anesthesia Plan  ASA: 3  Anesthesia Plan: General   Post-op Pain Management:    Induction: Intravenous  PONV Risk Score and Plan: 2 and Treatment may vary due to age or medical condition and Ondansetron  Airway Management Planned: LMA  Additional Equipment: None  Intra-op Plan:   Post-operative Plan: Extubation in OR  Informed Consent: I have reviewed the patients History and Physical, chart, labs and discussed the procedure including the risks, benefits and alternatives for the proposed anesthesia with the patient or authorized representative who has indicated his/her understanding and  acceptance.     Dental advisory given  Plan Discussed with: CRNA  Anesthesia Plan Comments:        Anesthesia Quick Evaluation

## 2020-11-07 NOTE — Progress Notes (Signed)
Mrs. Robert Horn is [name] and she speaks for patient.  Per Mrs. Robert Horn, patient has not had any chest pain or shortness of breath. Mrs. Robert Horn denies having any s/s of Covid in her household and no known exposure to Covid.   [name] cardiologist is Dr. [name], patient was seen in the office , he has not been able to schedule an appointment for 6 month follow up ,because no openings.  [name] will see a cardiology PA-C in Oct.  [name] has type II diabetes. Patient does not checked CBG.  [name] had taken today's dose of Jardiance, I gave Mrs. Robert Horn to have patient hold am dose of Jardiance.  [name] is [name] PCP and manages patient's diabetes and renal disease.

## 2020-11-08 ENCOUNTER — Ambulatory Visit (HOSPITAL_COMMUNITY): Payer: Medicare HMO | Admitting: Anesthesiology

## 2020-11-08 ENCOUNTER — Observation Stay (HOSPITAL_COMMUNITY)
Admission: RE | Admit: 2020-11-08 | Discharge: 2020-11-09 | Disposition: A | Discharge status: Home / Self Care | Payer: Medicare HMO | Attending: Orthopedic Surgery | Admitting: Orthopedic Surgery

## 2020-11-08 ENCOUNTER — Encounter (HOSPITAL_COMMUNITY): Payer: Self-pay | Admitting: Orthopedic Surgery

## 2020-11-08 ENCOUNTER — Other Ambulatory Visit: Payer: Self-pay

## 2020-11-08 ENCOUNTER — Encounter (HOSPITAL_COMMUNITY)
Admission: RE | Disposition: A | Discharge status: Home / Self Care | Payer: Self-pay | Source: Home / Self Care | Attending: Orthopedic Surgery

## 2020-11-08 DIAGNOSIS — M86171 Other acute osteomyelitis, right ankle and foot: Principal | ICD-10-CM | POA: Insufficient documentation

## 2020-11-08 DIAGNOSIS — Z7984 Long term (current) use of oral hypoglycemic drugs: Secondary | ICD-10-CM | POA: Diagnosis not present

## 2020-11-08 DIAGNOSIS — I2510 Atherosclerotic heart disease of native coronary artery without angina pectoris: Secondary | ICD-10-CM | POA: Insufficient documentation

## 2020-11-08 DIAGNOSIS — Z85038 Personal history of other malignant neoplasm of large intestine: Secondary | ICD-10-CM | POA: Diagnosis not present

## 2020-11-08 DIAGNOSIS — I509 Heart failure, unspecified: Secondary | ICD-10-CM | POA: Insufficient documentation

## 2020-11-08 DIAGNOSIS — N189 Chronic kidney disease, unspecified: Secondary | ICD-10-CM | POA: Diagnosis not present

## 2020-11-08 DIAGNOSIS — Z87891 Personal history of nicotine dependence: Secondary | ICD-10-CM | POA: Diagnosis not present

## 2020-11-08 DIAGNOSIS — M869 Osteomyelitis, unspecified: Secondary | ICD-10-CM | POA: Diagnosis not present

## 2020-11-08 DIAGNOSIS — I130 Hypertensive heart and chronic kidney disease with heart failure and stage 1 through stage 4 chronic kidney disease, or unspecified chronic kidney disease: Secondary | ICD-10-CM | POA: Insufficient documentation

## 2020-11-08 DIAGNOSIS — Z20822 Contact with and (suspected) exposure to covid-19: Secondary | ICD-10-CM | POA: Insufficient documentation

## 2020-11-08 DIAGNOSIS — L02611 Cutaneous abscess of right foot: Secondary | ICD-10-CM

## 2020-11-08 DIAGNOSIS — L97511 Non-pressure chronic ulcer of other part of right foot limited to breakdown of skin: Secondary | ICD-10-CM

## 2020-11-08 DIAGNOSIS — J45909 Unspecified asthma, uncomplicated: Secondary | ICD-10-CM | POA: Insufficient documentation

## 2020-11-08 DIAGNOSIS — E1142 Type 2 diabetes mellitus with diabetic polyneuropathy: Secondary | ICD-10-CM | POA: Diagnosis not present

## 2020-11-08 DIAGNOSIS — E1122 Type 2 diabetes mellitus with diabetic chronic kidney disease: Secondary | ICD-10-CM | POA: Diagnosis not present

## 2020-11-08 DIAGNOSIS — E1169 Type 2 diabetes mellitus with other specified complication: Secondary | ICD-10-CM | POA: Diagnosis not present

## 2020-11-08 DIAGNOSIS — E11621 Type 2 diabetes mellitus with foot ulcer: Secondary | ICD-10-CM | POA: Diagnosis not present

## 2020-11-08 HISTORY — DX: Atherosclerotic heart disease of native coronary artery without angina pectoris: I25.10

## 2020-11-08 HISTORY — DX: Anxiety disorder, unspecified: F41.9

## 2020-11-08 HISTORY — PX: AMPUTATION: SHX166

## 2020-11-08 HISTORY — DX: Unspecified asthma, uncomplicated: J45.909

## 2020-11-08 HISTORY — PX: APPLICATION OF WOUND VAC: SHX5189

## 2020-11-08 HISTORY — DX: Chronic kidney disease, unspecified: N18.9

## 2020-11-08 HISTORY — DX: Polyneuropathy, unspecified: G62.9

## 2020-11-08 LAB — CBC
HCT: 41.2 % (ref 39.0–52.0)
Hemoglobin: 13.5 g/dL (ref 13.0–17.0)
MCH: 29.9 pg (ref 26.0–34.0)
MCHC: 32.8 g/dL (ref 30.0–36.0)
MCV: 91.4 fL (ref 80.0–100.0)
Platelets: UNDETERMINED 10*3/uL (ref 150–400)
RBC: 4.51 MIL/uL (ref 4.22–5.81)
RDW: 13.7 % (ref 11.5–15.5)
WBC: 5.3 10*3/uL (ref 4.0–10.5)
nRBC: 0 % (ref 0.0–0.2)

## 2020-11-08 LAB — SARS CORONAVIRUS 2 BY RT PCR (HOSPITAL ORDER, PERFORMED IN ~~LOC~~ HOSPITAL LAB): SARS Coronavirus 2: NEGATIVE

## 2020-11-08 LAB — BASIC METABOLIC PANEL
Anion gap: 9 (ref 5–15)
BUN: 29 mg/dL — ABNORMAL HIGH (ref 8–23)
CO2: 22 mmol/L (ref 22–32)
Calcium: 9.1 mg/dL (ref 8.9–10.3)
Chloride: 108 mmol/L (ref 98–111)
Creatinine, Ser: 1.47 mg/dL — ABNORMAL HIGH (ref 0.61–1.24)
GFR, Estimated: 46 mL/min — ABNORMAL LOW (ref >60)
Glucose, Bld: 108 mg/dL — ABNORMAL HIGH (ref 70–99)
Potassium: 4.9 mmol/L (ref 3.5–5.1)
Sodium: 139 mmol/L (ref 135–145)

## 2020-11-08 LAB — GLUCOSE, CAPILLARY
Glucose-Capillary: 120 mg/dL — ABNORMAL HIGH (ref 70–99)
Glucose-Capillary: 119 mg/dL — ABNORMAL HIGH (ref 70–99)
Glucose-Capillary: 116 mg/dL — ABNORMAL HIGH (ref 70–99)

## 2020-11-08 SURGERY — AMPUTATION, FOOT, RAY
Anesthesia: General | Laterality: Right

## 2020-11-08 MED ORDER — 0.9 % SODIUM CHLORIDE (POUR BTL) OPTIME
TOPICAL | Status: DC | PRN
  Administered 2020-11-08: 1000 mL

## 2020-11-08 MED ORDER — ALUM & MAG HYDROXIDE-SIMETH 200-200-20 MG/5ML PO SUSP: 15.0000 mL | ORAL | Status: DC | PRN

## 2020-11-08 MED ORDER — FUROSEMIDE 40 MG PO TABS
60.0000 mg | ORAL_TABLET | Freq: Every day | ORAL | Status: DC
  Administered 2020-11-08 – 2020-11-09 (×2): 60 mg via ORAL
  Filled 2020-11-08 (×2): qty 1

## 2020-11-08 MED ORDER — JUVEN PO PACK
1.0000 | PACK | Freq: Two times a day (BID) | ORAL | Status: DC
  Administered 2020-11-09: 1 via ORAL
  Filled 2020-11-08: qty 1

## 2020-11-08 MED ORDER — ASCORBIC ACID 500 MG PO TABS
1000.0000 mg | ORAL_TABLET | Freq: Every day | ORAL | Status: DC
  Administered 2020-11-08 – 2020-11-09 (×2): 1000 mg via ORAL
  Filled 2020-11-08 (×2): qty 2

## 2020-11-08 MED ORDER — ORAL CARE MOUTH RINSE: 15.0000 mL | Freq: Once | OROMUCOSAL | Status: AC

## 2020-11-08 MED ORDER — PROPOFOL 10 MG/ML IV BOLUS
INTRAVENOUS | Status: DC | PRN
  Administered 2020-11-08: 200 mg via INTRAVENOUS

## 2020-11-08 MED ORDER — VITAMIN D 25 MCG (1000 UNIT) PO TABS
2000.0000 [IU] | ORAL_TABLET | Freq: Every day | ORAL | Status: DC
  Administered 2020-11-08 – 2020-11-09 (×2): 2000 [IU] via ORAL
  Filled 2020-11-08 (×4): qty 2

## 2020-11-08 MED ORDER — ASPIRIN EC 81 MG PO TBEC
81.0000 mg | DELAYED_RELEASE_TABLET | Freq: Every day | ORAL | Status: DC
  Administered 2020-11-09: 81 mg via ORAL
  Filled 2020-11-08: qty 1

## 2020-11-08 MED ORDER — ADULT MULTIVITAMIN W/MINERALS CH
1.0000 | ORAL_TABLET | Freq: Every day | ORAL | Status: DC
  Administered 2020-11-08 – 2020-11-09 (×2): 1 via ORAL
  Filled 2020-11-08 (×2): qty 1

## 2020-11-08 MED ORDER — LACTATED RINGERS IV SOLN: INTRAVENOUS | Status: DC

## 2020-11-08 MED ORDER — PROPOFOL 10 MG/ML IV BOLUS
INTRAVENOUS | Status: AC
  Filled 2020-11-08: qty 20

## 2020-11-08 MED ORDER — SODIUM CHLORIDE 0.9 % IV SOLN: INTRAVENOUS | Status: DC

## 2020-11-08 MED ORDER — ONDANSETRON HCL 4 MG/2ML IJ SOLN: 4.0000 mg | Freq: Four times a day (QID) | INTRAMUSCULAR | Status: DC | PRN

## 2020-11-08 MED ORDER — ONDANSETRON HCL 4 MG/2ML IJ SOLN
INTRAMUSCULAR | Status: AC
  Filled 2020-11-08: qty 2

## 2020-11-08 MED ORDER — ACETAMINOPHEN 325 MG PO TABS: 325.0000 mg | ORAL_TABLET | Freq: Four times a day (QID) | ORAL | Status: DC | PRN

## 2020-11-08 MED ORDER — SACUBITRIL-VALSARTAN 49-51 MG PO TABS
1.0000 | ORAL_TABLET | Freq: Two times a day (BID) | ORAL | Status: DC
  Administered 2020-11-08 – 2020-11-09 (×2): 1 via ORAL
  Filled 2020-11-08 (×2): qty 1

## 2020-11-08 MED ORDER — OXYCODONE HCL 5 MG PO TABS
ORAL_TABLET | ORAL | Status: AC
  Filled 2020-11-08: qty 1

## 2020-11-08 MED ORDER — CHLORHEXIDINE GLUCONATE 0.12 % MT SOLN
15.0000 mL | Freq: Once | OROMUCOSAL | Status: AC
  Administered 2020-11-08: 15 mL via OROMUCOSAL
  Filled 2020-11-08: qty 15

## 2020-11-08 MED ORDER — GUAIFENESIN-DM 100-10 MG/5ML PO SYRP: 15.0000 mL | ORAL_SOLUTION | ORAL | Status: DC | PRN

## 2020-11-08 MED ORDER — GLYCOPYRROLATE PF 0.2 MG/ML IJ SOSY
PREFILLED_SYRINGE | INTRAMUSCULAR | Status: AC
  Filled 2020-11-08: qty 1

## 2020-11-08 MED ORDER — PHENYLEPHRINE 40 MCG/ML (10ML) SYRINGE FOR IV PUSH (FOR BLOOD PRESSURE SUPPORT)
PREFILLED_SYRINGE | INTRAVENOUS | Status: AC
  Filled 2020-11-08: qty 10

## 2020-11-08 MED ORDER — OXYCODONE HCL 5 MG PO TABS
5.0000 mg | ORAL_TABLET | Freq: Once | ORAL | Status: AC | PRN
Start: 2020-11-08 — End: 2020-11-08
  Administered 2020-11-08: 5 mg via ORAL

## 2020-11-08 MED ORDER — ZINC SULFATE 220 (50 ZN) MG PO CAPS
220.0000 mg | ORAL_CAPSULE | Freq: Every day | ORAL | Status: DC
  Administered 2020-11-08 – 2020-11-09 (×2): 220 mg via ORAL
  Filled 2020-11-08 (×2): qty 1

## 2020-11-08 MED ORDER — POLYETHYLENE GLYCOL 3350 17 G PO PACK: 17.0000 g | PACK | Freq: Every day | ORAL | Status: DC | PRN

## 2020-11-08 MED ORDER — CLOPIDOGREL BISULFATE 75 MG PO TABS
75.0000 mg | ORAL_TABLET | Freq: Every day | ORAL | Status: DC
  Administered 2020-11-09: 75 mg via ORAL
  Filled 2020-11-08: qty 1

## 2020-11-08 MED ORDER — FENTANYL CITRATE (PF) 250 MCG/5ML IJ SOLN
INTRAMUSCULAR | Status: DC | PRN
  Administered 2020-11-08: 50 ug via INTRAVENOUS

## 2020-11-08 MED ORDER — OXYCODONE HCL 5 MG/5ML PO SOLN
5.0000 mg | Freq: Once | ORAL | Status: AC | PRN
Start: 2020-11-08 — End: 2020-11-08

## 2020-11-08 MED ORDER — BISACODYL 5 MG PO TBEC: 5.0000 mg | DELAYED_RELEASE_TABLET | Freq: Every day | ORAL | Status: DC | PRN

## 2020-11-08 MED ORDER — CEFAZOLIN SODIUM-DEXTROSE 2-4 GM/100ML-% IV SOLN
2.0000 g | Freq: Three times a day (TID) | INTRAVENOUS | Status: AC
  Administered 2020-11-08 – 2020-11-09 (×2): 2 g via INTRAVENOUS
  Filled 2020-11-08 (×2): qty 100

## 2020-11-08 MED ORDER — FENTANYL CITRATE (PF) 250 MCG/5ML IJ SOLN
INTRAMUSCULAR | Status: AC
  Filled 2020-11-08: qty 5

## 2020-11-08 MED ORDER — LIDOCAINE 2% (20 MG/ML) 5 ML SYRINGE
INTRAMUSCULAR | Status: AC
  Filled 2020-11-08: qty 5

## 2020-11-08 MED ORDER — EMPAGLIFLOZIN 25 MG PO TABS
25.0000 mg | ORAL_TABLET | Freq: Every day | ORAL | Status: DC
  Administered 2020-11-09: 25 mg via ORAL
  Filled 2020-11-08 (×3): qty 1

## 2020-11-08 MED ORDER — INSULIN ASPART 100 UNIT/ML IJ SOLN: 0.0000 [IU] | Freq: Three times a day (TID) | INTRAMUSCULAR | Status: DC

## 2020-11-08 MED ORDER — ROSUVASTATIN CALCIUM 20 MG PO TABS
20.0000 mg | ORAL_TABLET | Freq: Every day | ORAL | Status: DC
  Administered 2020-11-09: 20 mg via ORAL
  Filled 2020-11-08: qty 1

## 2020-11-08 MED ORDER — DEXAMETHASONE SODIUM PHOSPHATE 10 MG/ML IJ SOLN
INTRAMUSCULAR | Status: AC
  Filled 2020-11-08: qty 1

## 2020-11-08 MED ORDER — CEFAZOLIN SODIUM-DEXTROSE 2-4 GM/100ML-% IV SOLN
2.0000 g | INTRAVENOUS | Status: AC
  Administered 2020-11-08: 2 g via INTRAVENOUS
  Filled 2020-11-08: qty 100

## 2020-11-08 MED ORDER — CARVEDILOL 3.125 MG PO TABS
3.1250 mg | ORAL_TABLET | Freq: Two times a day (BID) | ORAL | Status: DC
  Administered 2020-11-08: 3.125 mg via ORAL
  Filled 2020-11-08 (×2): qty 1

## 2020-11-08 MED ORDER — HYDROMORPHONE HCL 1 MG/ML IJ SOLN
0.5000 mg | INTRAMUSCULAR | Status: DC | PRN
  Administered 2020-11-08: 0.5 mg via INTRAVENOUS
  Filled 2020-11-08: qty 0.5

## 2020-11-08 MED ORDER — PHENYLEPHRINE 40 MCG/ML (10ML) SYRINGE FOR IV PUSH (FOR BLOOD PRESSURE SUPPORT)
PREFILLED_SYRINGE | INTRAVENOUS | Status: DC | PRN
  Administered 2020-11-08: 80 ug via INTRAVENOUS
  Administered 2020-11-08: 120 ug via INTRAVENOUS

## 2020-11-08 MED ORDER — B COMPLEX-C PO TABS
1.0000 | ORAL_TABLET | Freq: Every day | ORAL | Status: DC
  Administered 2020-11-08 – 2020-11-09 (×2): 1 via ORAL
  Filled 2020-11-08 (×3): qty 1

## 2020-11-08 MED ORDER — ACETAMINOPHEN 500 MG PO TABS
1000.0000 mg | ORAL_TABLET | Freq: Once | ORAL | Status: AC
  Administered 2020-11-08: 1000 mg via ORAL
  Filled 2020-11-08: qty 2

## 2020-11-08 MED ORDER — PHENOL 1.4 % MT LIQD: 1.0000 | OROMUCOSAL | Status: DC | PRN

## 2020-11-08 MED ORDER — GLYCOPYRROLATE 0.2 MG/ML IJ SOLN
INTRAMUSCULAR | Status: DC | PRN
  Administered 2020-11-08: .2 mg via INTRAVENOUS

## 2020-11-08 MED ORDER — LACTATED RINGERS IV SOLN: INTRAVENOUS | Status: DC | PRN

## 2020-11-08 MED ORDER — OXYCODONE HCL 5 MG PO TABS
5.0000 mg | ORAL_TABLET | ORAL | Status: DC | PRN
  Administered 2020-11-08: 5 mg via ORAL
  Administered 2020-11-08 – 2020-11-09 (×2): 10 mg via ORAL
  Filled 2020-11-08: qty 2
  Filled 2020-11-08: qty 1
  Filled 2020-11-08: qty 2

## 2020-11-08 MED ORDER — PANTOPRAZOLE SODIUM 40 MG PO TBEC
40.0000 mg | DELAYED_RELEASE_TABLET | Freq: Every day | ORAL | Status: DC
  Administered 2020-11-08 – 2020-11-09 (×2): 40 mg via ORAL
  Filled 2020-11-08 (×2): qty 1

## 2020-11-08 MED ORDER — LIDOCAINE HCL (CARDIAC) PF 100 MG/5ML IV SOSY
PREFILLED_SYRINGE | INTRAVENOUS | Status: DC | PRN
  Administered 2020-11-08: 100 mg via INTRAVENOUS

## 2020-11-08 MED ORDER — VITAMIN B COMPLEX-C PO CAPS: ORAL_CAPSULE | Freq: Every day | ORAL | Status: DC

## 2020-11-08 MED ORDER — ONDANSETRON HCL 4 MG/2ML IJ SOLN
INTRAMUSCULAR | Status: DC | PRN
  Administered 2020-11-08: 4 mg via INTRAVENOUS

## 2020-11-08 MED ORDER — AMLODIPINE BESYLATE 10 MG PO TABS
10.0000 mg | ORAL_TABLET | Freq: Every day | ORAL | Status: DC
  Administered 2020-11-09: 10 mg via ORAL
  Filled 2020-11-08: qty 1

## 2020-11-08 MED ORDER — FENTANYL CITRATE (PF) 100 MCG/2ML IJ SOLN: 25.0000 ug | INTRAMUSCULAR | Status: DC | PRN

## 2020-11-08 MED ORDER — TAMSULOSIN HCL 0.4 MG PO CAPS
0.4000 mg | ORAL_CAPSULE | Freq: Every day | ORAL | Status: DC
  Administered 2020-11-08 – 2020-11-09 (×2): 0.4 mg via ORAL
  Filled 2020-11-08 (×2): qty 1

## 2020-11-08 MED ORDER — DOCUSATE SODIUM 100 MG PO CAPS
100.0000 mg | ORAL_CAPSULE | Freq: Every day | ORAL | Status: DC
  Administered 2020-11-09: 100 mg via ORAL
  Filled 2020-11-08: qty 1

## 2020-11-08 MED ORDER — FINASTERIDE 5 MG PO TABS
5.0000 mg | ORAL_TABLET | Freq: Every day | ORAL | Status: DC
  Administered 2020-11-08 – 2020-11-09 (×2): 5 mg via ORAL
  Filled 2020-11-08 (×2): qty 1

## 2020-11-08 MED ORDER — FERROUS SULFATE 325 (65 FE) MG PO TABS
325.0000 mg | ORAL_TABLET | Freq: Every day | ORAL | Status: DC
  Administered 2020-11-09: 325 mg via ORAL
  Filled 2020-11-08: qty 1

## 2020-11-08 MED ORDER — MAGNESIUM CITRATE PO SOLN
1.0000 | Freq: Once | ORAL | Status: DC | PRN
  Filled 2020-11-08: qty 296

## 2020-11-08 SURGICAL SUPPLY — 32 items
BAG COUNTER SPONGE SURGICOUNT (BAG) ×3 IMPLANT
BAG SPNG CNTER NS LX DISP (BAG) ×2
BLADE SAW SGTL MED 73X18.5 STR (BLADE) ×1 IMPLANT
BLADE SURG 21 STRL SS (BLADE) ×3 IMPLANT
BNDG COHESIVE 4X5 TAN STRL (GAUZE/BANDAGES/DRESSINGS) ×3 IMPLANT
BNDG GAUZE ELAST 4 BULKY (GAUZE/BANDAGES/DRESSINGS) ×3 IMPLANT
COVER SURGICAL LIGHT HANDLE (MISCELLANEOUS) ×6 IMPLANT
DRAPE DERMATAC (DRAPES) ×1 IMPLANT
DRAPE U-SHAPE 47X51 STRL (DRAPES) ×6 IMPLANT
DRESSING PEEL AND PLC PRVNA 13 (GAUZE/BANDAGES/DRESSINGS) IMPLANT
DRSG ADAPTIC 3X8 NADH LF (GAUZE/BANDAGES/DRESSINGS) ×3 IMPLANT
DRSG PAD ABDOMINAL 8X10 ST (GAUZE/BANDAGES/DRESSINGS) ×6 IMPLANT
DRSG PEEL AND PLACE PREVENA 13 (GAUZE/BANDAGES/DRESSINGS) ×3 IMPLANT
DURAPREP 26ML APPLICATOR (WOUND CARE) ×3 IMPLANT
ELECT REM PT RETURN 9FT ADLT (ELECTROSURGICAL) ×3 IMPLANT
ELECTRODE REM PT RTRN 9FT ADLT (ELECTROSURGICAL) ×2 IMPLANT
GAUZE SPONGE 4X4 12PLY STRL (GAUZE/BANDAGES/DRESSINGS) ×3 IMPLANT
GLOVE SURG ORTHO LTX SZ9 (GLOVE) ×3 IMPLANT
GLOVE SURG UNDER POLY LF SZ9 (GLOVE) ×3 IMPLANT
GOWN STRL REUS W/ TWL XL LVL3 (GOWN DISPOSABLE) ×4 IMPLANT
GOWN STRL REUS W/TWL XL LVL3 (GOWN DISPOSABLE) ×6
KIT BASIN OR (CUSTOM PROCEDURE TRAY) ×3 IMPLANT
KIT PREVENA INCISION MGT 13 (CANNISTER) ×1 IMPLANT
KIT TURNOVER KIT B (KITS) ×3 IMPLANT
NS IRRIG 1000ML POUR BTL (IV SOLUTION) ×3 IMPLANT
PACK ORTHO EXTREMITY (CUSTOM PROCEDURE TRAY) ×3 IMPLANT
PAD ARMBOARD 7.5X6 YLW CONV (MISCELLANEOUS) ×6 IMPLANT
STOCKINETTE IMPERVIOUS LG (DRAPES) IMPLANT
SUT ETHILON 2 0 PSLX (SUTURE) ×3 IMPLANT
TOWEL GREEN STERILE (TOWEL DISPOSABLE) ×3 IMPLANT
TUBE CONNECTING 12X1/4 (SUCTIONS) ×3 IMPLANT
YANKAUER SUCT BULB TIP NO VENT (SUCTIONS) ×3 IMPLANT

## 2020-11-08 NOTE — Care Management CC44 (Signed)
Condition Code 44 Documentation Completed  Patient Details  Name: Robert Horn MRN: [id number] Date of Birth: April 30, 1933   Condition Code 44 given:    Patient signature on Condition Code 44 notice:    Documentation of 2 MD's agreement:    Code 44 added to claim:       Laurena Slimmer, RN 11/08/2020, 5:29 PM

## 2020-11-08 NOTE — H&P (Signed)
Robert Horn is an 87 y.o. male.   Chief Complaint: Osteomyelitis Right Forefoot HPI: Patient is an 87 year old gentleman is seen in follow-up for osteomyelitis right forefoot.  He is currently on doxycycline he has had radiographs and MRI scan showing the osteomyelitis.  He is currently full weightbearing in regular shoewear.  Past Medical History:  Diagnosis Date   Anemia    Anxiety    situational- surgery   Asthma    as a child   Cancer (Robert Horn) 2019   colon- colectomy    CHF (congestive heart failure) ([place])    Chronic kidney disease    followed by Dr. Delfina [place]   Coronary artery disease    Diabetes mellitus without complication ([place])    Type II   Dyslipidemia 10/27/2015   Dyspnea    w/ exertion    Elevated PSA 10/27/2015   Erectile dysfunction 10/27/2015   Heart murmur    Hypertension 10/27/2015   Hypogonadism male 10/27/2015   Neuropathy    Obesity 10/27/2015   Peripheral vascular disease ([place])    Pneumonia 10/27/2015   pt states was 1982   Rotator cuff tear 10/27/2015    Past Surgical History:  Procedure Laterality Date   ABDOMINAL AORTOGRAM N/A 02/01/2019   Procedure: ABDOMINAL AORTOGRAM;  Surgeon: Marty Heck, MD;  Location: [facility];  Service: Cardiovascular;  Laterality: N/A;   AMPUTATION Right 02/03/2019   Procedure: RIGHT FOOT 1ST RAY AMPUTATION;  Surgeon: [name], MD;  Location: [facility];  Service: Orthopedics;  Laterality: Right;   AMPUTATION Right 08/02/2019   Procedure: RIGHT 2ND TOE AMPUTATION;  Surgeon: [name], MD;  Location: [facility];  Service: Orthopedics;  Laterality: Right;   CARDIAC CATHETERIZATION N/A 11/14/2015   Procedure: Left Heart Cath and Coronary Angiography;  Surgeon: [name], MD;  Location: [facility];  Service: Cardiovascular;  Laterality: N/A;   COLONOSCOPY     LAPAROSCOPIC PARTIAL COLECTOMY  12/15/2017   LAPAROSCOPIC PARTIAL COLECTOMY (N/A Abdomen)   LAPAROSCOPIC PARTIAL COLECTOMY N/A 12/15/2017    Procedure: LAPAROSCOPIC PARTIAL COLECTOMY;  Surgeon: Erroll Luna, MD;  Location: [facility];  Service: General;  Laterality: N/A;   LOWER EXTREMITY ANGIOGRAPHY Right 02/01/2019   Procedure: LOWER EXTREMITY ANGIOGRAPHY;  Surgeon: Marty Heck, MD;  Location: [facility];  Service: Cardiovascular;  Laterality: Right;   PERIPHERAL VASCULAR INTERVENTION Right 02/01/2019   Procedure: PERIPHERAL VASCULAR INTERVENTION;  Surgeon: Marty Heck, MD;  Location: [facility];  Service: Cardiovascular;  Laterality: Right;  SFA   TEE WITHOUT CARDIOVERSION N/A 12/15/2016   Procedure: TRANSESOPHAGEAL ECHOCARDIOGRAM (TEE);  Surgeon: Sherren Mocha, MD;  Location: [facility];  Service: Open Heart Surgery;  Laterality: N/A;   TRANSCATHETER AORTIC VALVE REPLACEMENT, TRANSFEMORAL N/A 12/15/2016   Procedure: TRANSCATHETER AORTIC VALVE REPLACEMENT, TRANSFEMORAL;  Surgeon: Sherren Mocha, MD;  Location: [facility];  Service: Open Heart Surgery;  Laterality: N/A;    Family History  Problem Relation Age of Onset   Diabetes Mother    Heart disease Mother    Pulmonary embolism Father    Social History:  reports that he has quit smoking. His smoking use included cigarettes. He has never used smokeless tobacco. He reports current alcohol use. He reports that he does not use drugs.  Allergies: No Known Allergies  No medications prior to admission.    No results found for this or any previous visit (from the past 48 hour(s)). No results found.  Review of Systems  All other systems reviewed and are negative.  There were no vitals taken for this visit. Physical Exam  Patient is alert, oriented, no adenopathy, well-dressed, normal affect, normal respiratory effort. Examination patient has a persistent ulcer that probes to bone there is no ascending cellulitis no crepitation no purulent drainage.Heart RRR Lungs Clear Assessment/Plan Plan: Discussed with the patient and his wife recommendation to  proceed with a transmetatarsal amputation.  Risk and benefits were discussed including risk of the wound not healing with weightbearing.  Patient states he understands wished to proceed at this time wants to proceed with general anesthesia.  Patient states he wished to proceed with surgery on Friday, August 12.  We will plan for 23-hour observation.  Bevely Palmer Claudell Wohler, PA 11/08/2020, 5:56 AM

## 2020-11-08 NOTE — Care Management Obs Status (Signed)
[facility] NOTIFICATION   Patient Details  Name: Lexton Hearn MRN: [id number] Date of Birth: 1934/02/26   Medicare Observation Status Notification Given:       Laurena Slimmer, RN 11/08/2020, 5:29 PM

## 2020-11-08 NOTE — Op Note (Signed)
11/08/2020  5:46 PM  PATIENT:  Robert Horn    PRE-OPERATIVE DIAGNOSIS:  Osteomyelitis Right Foot  POST-OPERATIVE DIAGNOSIS:  Same  PROCEDURE:  RIGHT TRANSMETATARSAL AMPUTATION, APPLICATION OF WOUND VAC  SURGEON:  [name], MD  PHYSICIAN ASSISTANT:None ANESTHESIA:   General  PREOPERATIVE INDICATIONS:  Robert Horn is a  87 y.o. male with a diagnosis of Osteomyelitis Right Foot who failed conservative measures and elected for surgical management.    The risks benefits and alternatives were discussed with the patient preoperatively including but not limited to the risks of infection, bleeding, nerve injury, cardiopulmonary complications, the need for revision surgery, among others, and the patient was willing to proceed.  OPERATIVE IMPLANTS: Praveena 13 cm wound VAC.  '@ENCIMAGES'$ @  OPERATIVE FINDINGS: Margins clear no infection at the level of amputation.  OPERATIVE PROCEDURE: Patient was brought the operating room after undergoing a regional anesthetic.  After adequate levels anesthesia were obtained patient's right lower extremity was prepped using DuraPrep draped into a sterile field a timeout was called.  A fishmouth incision was made just proximal to the plantar wound ulcer.  This was carried down to bone and a transmetatarsal amputation was performed with an oscillating saw.  Electrocautery was used for hemostasis.  The wound was irrigated with normal saline.  Incision closed using 2-0 nylon.  A Prevena 13 cm wound VAC was applied this had a good suction fit patient was taken the PACU in stable condition.   DISCHARGE PLANNING:  Antibiotic duration: 23-hour antibiotics  Weightbearing: Touchdown weightbearing on the right  Pain medication: Opioid pathway  Dressing care/ Wound VAC: Wound VAC for 1 week  Ambulatory devices: Walker or crutches  Discharge to: Discharge to home in 23 hours  Follow-up: In the office 1 week post operative.

## 2020-11-08 NOTE — Transfer of Care (Signed)
Immediate Anesthesia Transfer of Care Note  Patient: Robert Horn  Procedure(s) Performed: RIGHT TRANSMETATARSAL AMPUTATION (Right) APPLICATION OF WOUND VAC  Patient Location: PACU  Anesthesia Type:General  Level of Consciousness: drowsy, patient cooperative and responds to stimulation  Airway & Oxygen Therapy: Patient Spontanous Breathing and Patient connected to nasal cannula oxygen  Post-op Assessment: Report given to RN, Post -op Vital signs reviewed and stable and Patient moving all extremities X 4  Post vital signs: Reviewed and stable  Last Vitals:  Vitals Value Taken Time  BP 113/70 11/08/20 1331  Temp    Pulse 52 11/08/20 1332  Resp 21 11/08/20 1332  SpO2 99 % 11/08/20 1332  Vitals shown include unvalidated device data.  Last Pain:  Vitals:   11/08/20 1018  TempSrc:   PainSc: 0-No pain         Complications: No notable events documented.

## 2020-11-08 NOTE — Interval H&P Note (Signed)
History and Physical Interval Note:  11/08/2020 10:50 AM  Robert Horn  has presented today for surgery, with the diagnosis of Osteomyelitis Right Foot.  The various methods of treatment have been discussed with the patient and family. After consideration of risks, benefits and other options for treatment, the patient has consented to  Procedure(s): RIGHT TRANSMETATARSAL AMPUTATION (Right) as a surgical intervention.  The patient's history has been reviewed, patient examined, no change in status, stable for surgery.  I have reviewed the patient's chart and labs.  Questions were answered to the patient's satisfaction.     [name]

## 2020-11-08 NOTE — Anesthesia Postprocedure Evaluation (Signed)
Anesthesia Post Note  Patient: Robert Horn  Procedure(s) Performed: RIGHT TRANSMETATARSAL AMPUTATION (Right) APPLICATION OF WOUND VAC     Patient location during evaluation: PACU Anesthesia Type: General Level of consciousness: awake and alert and oriented Pain management: pain level controlled Vital Signs Assessment: post-procedure vital signs reviewed and stable Respiratory status: spontaneous breathing, nonlabored ventilation and respiratory function stable Cardiovascular status: blood pressure returned to baseline Postop Assessment: no apparent nausea or vomiting Anesthetic complications: no   No notable events documented.  Last Vitals:  Vitals:   11/08/20 1401 11/08/20 1415  BP: 121/68 121/72  Pulse: (!) 49 (!) 44  Resp: 19 18  Temp:  36.4 C  SpO2: 95% 99%    Last Pain:  Vitals:   11/08/20 1415  TempSrc:   PainSc: [place]

## 2020-11-08 NOTE — Progress Notes (Signed)
Orthopedic Tech Progress Note Patient Details:  Robert Horn 02-26-1934 [id number] Applied post op shoe to RLE. Ortho Devices Type of Ortho Device: Postop shoe/boot Ortho Device/Splint Location: RLE Ortho Device/Splint Interventions: Ordered, Application   Post Interventions Patient Tolerated: Well Instructions Provided: Adjustment of device  Petra Kuba 11/08/2020, 5:25 PM

## 2020-11-08 NOTE — Anesthesia Procedure Notes (Signed)
Procedure Name: LMA Insertion Date/Time: 11/08/2020 1:05 PM Performed by: Claris Che, CRNA Pre-anesthesia Checklist: Patient identified, Emergency Drugs available, Suction available, Patient being monitored and Timeout performed Patient Re-evaluated:Patient Re-evaluated prior to induction Oxygen Delivery Method: Circle system utilized Preoxygenation: Pre-oxygenation with 100% oxygen Induction Type: IV induction Ventilation: Mask ventilation without difficulty Tube size: 5.0 mm Number of attempts: 1 Placement Confirmation: positive ETCO2 and breath sounds checked- equal and bilateral Tube secured with: Tape Dental Injury: Teeth and Oropharynx as per pre-operative assessment

## 2020-11-09 ENCOUNTER — Encounter (HOSPITAL_COMMUNITY): Payer: Self-pay | Admitting: Orthopedic Surgery

## 2020-11-09 DIAGNOSIS — I2510 Atherosclerotic heart disease of native coronary artery without angina pectoris: Secondary | ICD-10-CM | POA: Diagnosis not present

## 2020-11-09 DIAGNOSIS — Z85038 Personal history of other malignant neoplasm of large intestine: Secondary | ICD-10-CM | POA: Diagnosis not present

## 2020-11-09 DIAGNOSIS — J45909 Unspecified asthma, uncomplicated: Secondary | ICD-10-CM | POA: Diagnosis not present

## 2020-11-09 DIAGNOSIS — E1122 Type 2 diabetes mellitus with diabetic chronic kidney disease: Secondary | ICD-10-CM | POA: Diagnosis not present

## 2020-11-09 DIAGNOSIS — N189 Chronic kidney disease, unspecified: Secondary | ICD-10-CM | POA: Diagnosis not present

## 2020-11-09 DIAGNOSIS — Z87891 Personal history of nicotine dependence: Secondary | ICD-10-CM | POA: Diagnosis not present

## 2020-11-09 DIAGNOSIS — I130 Hypertensive heart and chronic kidney disease with heart failure and stage 1 through stage 4 chronic kidney disease, or unspecified chronic kidney disease: Secondary | ICD-10-CM | POA: Diagnosis not present

## 2020-11-09 DIAGNOSIS — Z20822 Contact with and (suspected) exposure to covid-19: Secondary | ICD-10-CM | POA: Diagnosis not present

## 2020-11-09 DIAGNOSIS — I509 Heart failure, unspecified: Secondary | ICD-10-CM | POA: Diagnosis not present

## 2020-11-09 DIAGNOSIS — M86171 Other acute osteomyelitis, right ankle and foot: Secondary | ICD-10-CM | POA: Diagnosis not present

## 2020-11-09 LAB — CBC
HCT: 40.3 % (ref 39.0–52.0)
Hemoglobin: 13 g/dL (ref 13.0–17.0)
MCH: 29.3 pg (ref 26.0–34.0)
MCHC: 32.3 g/dL (ref 30.0–36.0)
MCV: 90.8 fL (ref 80.0–100.0)
Platelets: UNDETERMINED 10*3/uL (ref 150–400)
RBC: 4.44 MIL/uL (ref 4.22–5.81)
RDW: 13.7 % (ref 11.5–15.5)
WBC: 8.5 10*3/uL (ref 4.0–10.5)
nRBC: 0 % (ref 0.0–0.2)

## 2020-11-09 LAB — BASIC METABOLIC PANEL
Anion gap: 8 (ref 5–15)
BUN: 24 mg/dL — ABNORMAL HIGH (ref 8–23)
CO2: 24 mmol/L (ref 22–32)
Calcium: 8.9 mg/dL (ref 8.9–10.3)
Chloride: 105 mmol/L (ref 98–111)
Creatinine, Ser: 1.37 mg/dL — ABNORMAL HIGH (ref 0.61–1.24)
GFR, Estimated: 51 mL/min — ABNORMAL LOW (ref >60)
Glucose, Bld: 130 mg/dL — ABNORMAL HIGH (ref 70–99)
Potassium: 3.9 mmol/L (ref 3.5–5.1)
Sodium: 137 mmol/L (ref 135–145)

## 2020-11-09 LAB — HEMOGLOBIN A1C
Hgb A1c MFr Bld: 6.9 % — ABNORMAL HIGH (ref 4.8–5.6)
Mean Plasma Glucose: 151.33 mg/dL

## 2020-11-09 LAB — GLUCOSE, CAPILLARY: Glucose-Capillary: 113 mg/dL — ABNORMAL HIGH (ref 70–99)

## 2020-11-09 MED ORDER — OXYCODONE-ACETAMINOPHEN 5-325 MG PO TABS: 1.0000 | ORAL_TABLET | ORAL | 0 refills | Status: DC | PRN

## 2020-11-09 NOTE — Progress Notes (Signed)
   11/09/20 0231  Vitals  Temp 98.3 F (36.8 C)  Temp Source Axillary  BP (!) 145/85  MAP (mmHg) 103  BP Location Left Arm  BP Method Automatic  Patient Position (if appropriate) Lying  Pulse Rate (!) 52  Pulse Rate Source Dinamap  Resp 18  Level of Consciousness  Level of Consciousness Alert  MEWS COLOR  MEWS Score Color Green  Oxygen Therapy  SpO2 100 %  O2 Device Nasal Cannula  O2 Flow Rate (L/min) 2 L/min  MEWS Score  MEWS Temp 0  MEWS Systolic 0  MEWS Pulse 0  MEWS RR 0  MEWS LOC 0  MEWS Score 0

## 2020-11-09 NOTE — Progress Notes (Signed)
Patient ID: Robert Horn, male   DOB: [birth year], 87 y.o.   MRN: [id number] Patient without complaints this morning.  Patient states that the Dilaudid was too strong that he had last night but the Percocet was not strong enough.  The dressing is dry the wound VAC has minimal drainage.  Plan for discharge to home today after therapy.

## 2020-11-09 NOTE — Discharge Summary (Signed)
Discharge Diagnoses:  Active Problems:   Osteomyelitis of great toe of right foot ([place])   Surgeries: Procedure(s): RIGHT TRANSMETATARSAL AMPUTATION APPLICATION OF WOUND VAC on 11/08/2020    Consultants:   Discharged Condition: Improved  Hospital Course: Robert Horn is an 87 y.o. male who was admitted 11/08/2020 with a chief complaint of osteomyelitis right foot, with a final diagnosis of Osteomyelitis Right Foot.  Patient was brought to the operating room on 11/08/2020 and underwent Procedure(s): RIGHT TRANSMETATARSAL AMPUTATION APPLICATION OF WOUND VAC.    Patient was given perioperative antibiotics:  Anti-infectives (From admission, onward)    Start     Dose/Rate Route Frequency Ordered Stop   11/08/20 2100  ceFAZolin (ANCEF) IVPB 2g/100 mL premix        2 g 200 mL/hr over 30 Minutes Intravenous Every 8 hours 11/08/20 1643 11/09/20 0531   11/08/20 0930  ceFAZolin (ANCEF) IVPB 2g/100 mL premix        2 g 200 mL/hr over 30 Minutes Intravenous On call to O.R. 11/08/20 [id number] 11/08/20 1316     .  Patient was given sequential compression devices, early ambulation, and aspirin for DVT prophylaxis.  Recent vital signs: Patient Vitals for the past 24 hrs:  BP Temp Temp src Pulse Resp SpO2 Height Weight  11/09/20 0358 126/81 97.9 F (36.6 C) Oral (!) 55 18 100 % -- --  11/09/20 0300 (!) 142/69 -- -- (!) 56 20 98 % -- --  11/09/20 0231 (!) 145/85 98.3 F (36.8 C) Axillary (!) 52 18 100 % -- --  11/08/20 2340 140/77 98.6 F (37 C) Oral (!) 54 18 98 % -- --  11/08/20 1943 (!) 107/59 98.1 F (36.7 C) -- (!) 51 18 100 % -- --  11/08/20 1636 131/77 (!) 97.5 F (36.4 C) Oral (!) 55 20 100 % -- --  11/08/20 1615 (!) 142/89 -- -- (!) 56 20 98 % -- --  11/08/20 1600 134/83 -- -- (!) 48 17 97 % -- --  11/08/20 1530 131/71 -- -- (!) 49 13 100 % -- --  11/08/20 1500 129/65 -- -- (!) 50 18 99 % -- --  11/08/20 1445 130/80 -- -- (!) 53 12 98 % -- --  11/08/20 1415 121/72 97.6 F (36.4 C)  -- (!) 44 18 99 % -- --  11/08/20 1401 121/68 -- -- (!) 49 19 95 % -- --  11/08/20 1345 (!) 113/96 -- -- (!) 47 16 99 % -- --  11/08/20 1331 113/70 97.6 F (36.4 C) -- (!) 53 19 99 % -- --  11/08/20 0954 (!) 144/60 98.9 F (37.2 C) Oral (!) 58 18 98 % 6' (1.829 m) 103 kg  .  Recent laboratory studies: No results found.  Discharge Medications:   Allergies as of 11/09/2020   No Known Allergies      Medication List     TAKE these medications    amLODipine 10 MG tablet Commonly known as: NORVASC Take 1 tablet (10 mg total) by mouth daily.   aspirin 81 MG EC tablet Take 81 mg by mouth daily. Swallow whole.   carvedilol 3.125 MG tablet Commonly known as: COREG TAKE 1 TABLET (3.125 MG TOTAL) BY MOUTH 2 (TWO) TIMES DAILY.   clopidogrel 75 MG tablet Commonly known as: PLAVIX TAKE 1 TABLET (75 MG TOTAL) BY MOUTH DAILY WITH BREAKFAST.   doxycycline 100 MG tablet Commonly known as: VIBRA-TABS TAKE 1 TABLET BY MOUTH TWICE A DAY   Entresto 49-51  MG Generic drug: sacubitril-valsartan TAKE 1 TABLET BY MOUTH TWICE A DAY   ferrous sulfate 325 (65 FE) MG tablet Take 325 mg by mouth daily with breakfast.   finasteride 5 MG tablet Commonly known as: PROSCAR Take 5 mg by mouth daily.   furosemide 40 MG tablet Commonly known as: LASIX Take 1.5 tablets (60 mg total) by mouth daily. Please make yearly appt with Dr. [name] for September 2022 for future refills. Thank you 1st attempt   Jardiance 25 MG Tabs tablet Generic drug: empagliflozin Take 25 mg by mouth daily.   multivitamin with minerals Tabs tablet Take 1 tablet by mouth daily.   naproxen sodium 220 MG tablet Commonly known as: ALEVE Take 220 mg by mouth daily as needed (pain).   omega-3 acid ethyl esters 1 g capsule Commonly known as: LOVAZA TAKE 1 CAPSULE BY MOUTH EVERY DAY   oxyCODONE-acetaminophen 5-325 MG tablet Commonly known as: PERCOCET/ROXICET Take 1 tablet by mouth every 4 (four) hours as needed.    rosuvastatin 20 MG tablet Commonly known as: CRESTOR Take 20 mg by mouth daily.   silver sulfADIAZINE 1 % cream Commonly known as: Silvadene Apply 1 application topically daily.   tamsulosin 0.4 MG Caps capsule Commonly known as: FLOMAX Take 0.4 mg by mouth daily.   VITAMIN B COMPLEX-C PO Take 1 tablet by mouth daily.   Vitamin D 50 MCG (2000 UT) tablet Take 2,000 Units by mouth daily.               Discharge Care Instructions  (From admission, onward)           Start     Ordered   11/09/20 0000  Touch down weight bearing       Question Answer Comment  Laterality right   Extremity Lower      11/09/20 0900            Diagnostic Studies: No results found.  Patient benefited maximally from their hospital stay and there were no complications.     Disposition: Discharge disposition: 01-Home or Self Care      Discharge Instructions     Call MD / Call 911   Complete by: As directed    If you experience chest pain or shortness of breath, CALL 911 and be transported to the hospital emergency room.  If you develope a fever above 101 F, pus (white drainage) or increased drainage or redness at the wound, or calf pain, call your surgeon's office.   Constipation Prevention   Complete by: As directed    Drink plenty of fluids.  Prune juice may be helpful.  You may use a stool softener, such as Colace (over the counter) 100 mg twice a day.  Use MiraLax (over the counter) for constipation as needed.   Diet - low sodium heart healthy   Complete by: As directed    Increase activity slowly as tolerated   Complete by: As directed    Negative Pressure Wound Therapy - Incisional   Complete by: As directed    Show patient how to attach preveena   Negative Pressure Wound Therapy - Incisional   Complete by: As directed    Post-operative opioid taper instructions:   Complete by: As directed    POST-OPERATIVE OPIOID TAPER INSTRUCTIONS: It is important to wean off  of your opioid medication as soon as possible. If you do not need pain medication after your surgery it is ok to stop day one. Opioids include: Codeine,  Hydrocodone(Norco, Vicodin), Oxycodone(Percocet, oxycontin) and hydromorphone amongst others.  Long term and even short term use of opiods can cause: Increased pain response Dependence Constipation Depression Respiratory depression And more.  Withdrawal symptoms can include Flu like symptoms Nausea, vomiting And more Techniques to manage these symptoms Hydrate well Eat regular healthy meals Stay active Use relaxation techniques(deep breathing, meditating, yoga) Do Not substitute Alcohol to help with tapering If you have been on opioids for less than two weeks and do not have pain than it is ok to stop all together.  Plan to wean off of opioids This plan should start within one week post op of your joint replacement. Maintain the same interval or time between taking each dose and first decrease the dose.  Cut the total daily intake of opioids by one tablet each day Next start to increase the time between doses. The last dose that should be eliminated is the evening dose.      Touch down weight bearing   Complete by: As directed    Laterality: right   Extremity: Lower       Follow-up Information     Suzan Slick, NP Follow up in 1 week(s).   Specialty: Orthopedic Surgery Contact information: [address] [place] [address] [phone]                  Signed: [name] 11/09/2020, 9:00 AM

## 2020-11-09 NOTE — Significant Event (Signed)
Rapid Response Event Note   Reason for Call :  SOB, pt says he thinks he is having a heart attack  Initial Focused Assessment:  Pt lying in bed with eyes open, alert and oriented, in no distress. He says he is SOB but denies chest pain. He is able to speak in complete sentences. His lungs are CTA. Skin warm and dry.  T-98.3, HR-52(SB), bp-145/85, RR-18, SpO2-100% 2L [place].  Pt said he was given dilaudid earlier and it made woozy/dizzy.   Interventions:  EKG-SB with 1st degree AV block with PACs, incomplete R BBB. Anteroseptal infarct, age undetermined.  Plan of Care:  Pt is in no visible distress. He is having no pain. His EKG/VS are stable. His lungs are clear. Continue to monitor pt. Call RRT if further assistance needed.    Event Summary:   MD Notified:  Call [place], Elai Vanwyk Anderson, RN

## 2020-11-09 NOTE — Progress Notes (Signed)
Inpatient Rehab Admissions Coordinator:  Consult received. Note pt is under observation status at this time. Pt may not have the medical necessity to warrant an inpatient rehab stay if they remain observation. If status were to change to inpatient, AC will assess for candidacy.   Daritza Brees Graves Madden, MS, CCC-SLP Admissions Coordinator [phone]  

## 2020-11-09 NOTE — Progress Notes (Addendum)
   11/09/20 0233  Rapid Response Notification  Name of Rapid Response RN Notified [id number]  Date Rapid Response Notified 11/09/20  Time Rapid Response Notified [id number]   Patient states, "I'm having a heart attack". He c/o SOB, RR 20 - even and unlabored. O2 sat 94 on RA. Oxygen applied at 2 lpm/[place], 100 %. He just kept saying, "I'm sick, I don't feel good". He was demanding to see a cardiologist because "I have CHF".  Rapid Response at bedside to assess pt. Does not appear to be in acute distress. EKG done, no significant change from baseline. Ortho on call notified - awaiting response. Reassessed pt - observed patient watching TV. Patient received Dilaudid 0.5 mg PO earlier on the shift. States he felt woozy  after that. Educated pt that he doesn't have to take Dilaudid if he feels like it doesn't agree with him. It was given to him d/t as a breakthrough relief from metatarsal amputation, and it was not time for Oxycodone.  Will continue to monitor.

## 2020-11-09 NOTE — Progress Notes (Signed)
   11/09/20 0300  Assess: MEWS Score  BP (!) 142/69  Pulse Rate (!) 56  Resp 20  Level of Consciousness Alert  SpO2 98 %  O2 Device Nasal Cannula  O2 Flow Rate (L/min) 2 L/min  Assess: MEWS Score  MEWS Temp 0  MEWS Systolic 0  MEWS Pulse 0  MEWS RR 0  MEWS LOC 0  MEWS Score 0  MEWS Score Color Robert Horn

## 2020-11-09 NOTE — Evaluation (Signed)
Physical Therapy Evaluation Patient Details Name: Robert Horn MRN: [id number] DOB: Oct 09, 1933 Today's Date: 11/09/2020   History of Present Illness  The pt is an 87 yo male presenting for R transmet amputation with application of wound vac on 8/12 due to osteomyelitis of R foot. PMH includes: colon cancer, CHF, CKD, CAD, DM II, HTN, PVD, neuropathy, and prior amputations of R toes.   Clinical Impression  Pt in bed upon arrival of PT, agreeable to evaluation at this time. Prior to admission the pt was independent without need for AD for mobility in the home, reports intermittent use of knee scooter for community distances. The pt now presents with limitations in functional mobility, strength, muscular endurance, and power due to above dx, and will continue to benefit from skilled PT to address these deficits. The pt was able to demo good mobility in the bed, and completed initial sit-stand without LOB or need for assist, but fatigued after ~15 ft ambulation in the room with use of RW and was unable to continue while maintaining TDWB RLE. The pt also demos poor LE strength and control with eccentric lower to recliner, will benefit from HHPT to maximize functional endurance for mobility in the home, as well as to maximize safety with ambulation and transfers in the home.      Follow Up Recommendations Home health PT;Supervision for mobility/OOB    Equipment Recommendations  None recommended by PT (pt well equipped)    Recommendations for Other Services       Precautions / Restrictions Precautions Precautions: Fall Required Braces or Orthoses: Other Brace Other Brace: post-op shoe Restrictions Weight Bearing Restrictions: Yes RLE Weight Bearing: Touchdown weight bearing Other Position/Activity Restrictions: through heel only      Mobility  Bed Mobility Overal bed mobility: Modified Independent             General bed mobility comments: slightly increased time, no assist  needed    Transfers Overall transfer level: Needs assistance Equipment used: Rolling walker (2 wheeled) Transfers: Sit to/from Stand Sit to Stand: Supervision         General transfer comment: supervision for safety with cues to maintain R TDWB only through heel. pt with poor eccentric lower  Ambulation/Gait Ambulation/Gait assistance: Min guard Gait Distance (Feet): 15 Feet Assistive device: Rolling walker (2 wheeled) Gait Pattern/deviations: Step-to pattern Gait velocity: decreased Gait velocity interpretation: <1.31 ft/sec, indicative of household ambulator General Gait Details: pt with short steps with hop-to pattern progressing to TDWB through R heel with fatigue. limited to 15 ft due to fatigue with use of BUE to reduce wt through R foot  Stairs Stairs:  (discussed verbally, pt given handout)              Balance Overall balance assessment: Mild deficits observed, not formally tested                                           Pertinent Vitals/Pain Pain Assessment: 0-10 Pain Score: 3  Pain Location: R foot Pain Descriptors / Indicators: Grimacing;Discomfort Pain Intervention(s): Monitored during session;Limited activity within patient's tolerance;Repositioned    Home Living Family/patient expects to be discharged to:: Private residence Living Arrangements: Spouse/significant other Available Help at Discharge: Available 24 hours/day;Family (wife is retired but still does some Financial risk analyst) Type of Home: House Home Access: Stairs to enter Entrance Stairs-Rails: None Entrance Stairs-Number of Steps: front: 3  large steps (deep); back: 1, 2, and 2 steps to deck Home Layout: Multi-level;Able to live on main level with bedroom/bathroom Home Equipment: Walker - 2 wheels;Crutches;Cane - single point;Shower seat;Bedside commode (knee scooter)      Prior Function Level of Independence: Independent         Comments: still working part time as an  Materials engineer. still uses scooter at times for ease with community distances     Hand Dominance   Dominant Hand: Right    Extremity/Trunk Assessment   Upper Extremity Assessment Upper Extremity Assessment: Overall WFL for tasks assessed    Lower Extremity Assessment Lower Extremity Assessment: RLE deficits/detail RLE Deficits / Details: good strength at R hip and knee, limited assessment of R ankle due to NWB RLE: Unable to fully assess due to immobilization RLE Sensation: WNL RLE Coordination: WNL    Cervical / Trunk Assessment Cervical / Trunk Assessment: Normal  Communication   Communication: HOH  Cognition Arousal/Alertness: Awake/alert Behavior During Therapy: WFL for tasks assessed/performed Overall Cognitive Status: Within Functional Limits for tasks assessed                                        General Comments General comments (skin integrity, edema, etc.): VSS on RA, pt given handout for navigating stairs with RW    Exercises     Assessment/Plan    PT Assessment Patient needs continued PT services  PT Problem List Decreased strength;Decreased activity tolerance;Decreased balance;Decreased mobility       PT Treatment Interventions DME instruction;Gait training;Stair training;Functional mobility training;Therapeutic activities;Therapeutic exercise;Balance training;Patient/family education    PT Goals (Current goals can be found in the Care Plan section)  Acute Rehab PT Goals Patient Stated Goal: to go home PT Goal Formulation: With patient Time For Goal Achievement: 11/23/20 Potential to Achieve Goals: Good    Frequency Min 3X/week    AM-PAC PT "6 Clicks" Mobility  Outcome Measure Help needed turning from your back to your side while in a flat bed without using bedrails?: None Help needed moving from lying on your back to sitting on the side of a flat bed without using bedrails?: None Help needed moving to and from a bed to a chair  (including a wheelchair)?: A Little Help needed standing up from a chair using your arms (e.g., wheelchair or bedside chair)?: A Little Help needed to walk in hospital room?: A Little Help needed climbing 3-5 steps with a railing? : A Little 6 Click Score: 20    End of Session Equipment Utilized During Treatment: Gait belt (post-op shoe) Activity Tolerance: Patient limited by fatigue Patient left: in chair;with call bell/phone within reach;with nursing/sitter in room Nurse Communication: Mobility status PT Visit Diagnosis: Other abnormalities of gait and mobility (R26.89);Muscle weakness (generalized) (M62.81)    Time: [id number] PT Time Calculation (min) (ACUTE ONLY): 37 min   Charges:   PT Evaluation $PT Eval Low Complexity: 1 Low PT Treatments $Gait Training: 8-22 mins        Inocencio Homes, PT, DPT   Acute Rehabilitation Department Pager #: [phone]  Sandra Cockayne 11/09/2020, 9:03 AM

## 2020-11-09 NOTE — Progress Notes (Signed)
Upon doing the discharge medication review, I noticed doxycycline was marked to continue. PTA the patient was on doxycycline with a 30 day supply last picked up on 10/31/20. The doxycycline was written by Dr. Sharol Given for the patient's foot infection. However, the patient has now undergone a transmetatarsal amputation. The patient received post-op cefazolin, but no other antibiotics were given post-surgery.   I called the [place] line to clarify the doxycycline discharge instructions. The PA, [facility], called Dr. Sharol Given for clarification. Lurena Joiner informed me that the patient does not need the doxycycline anymore and could stop taking, but the patient had already discharged.  I called the patient's wife to inform her that the patient should not take doxycycline upon discharge, which is different than what the discharge instructions say. The wife verbalized understanding and was appreciative. She stated that she manages the patient's medications and that she would make sure he does not take doxycycline anymore unless directed to.  Shauna Hugh, PharmD, [place]  PGY-2 Pharmacy Resident 11/09/2020 12:48 PM  Please check [email or web address] for unit-specific pharmacy phone numbers.

## 2020-11-10 ENCOUNTER — Other Ambulatory Visit: Payer: Self-pay | Admitting: Interventional Cardiology

## 2020-11-11 ENCOUNTER — Encounter: Payer: Self-pay | Admitting: Orthopedic Surgery

## 2020-11-11 LAB — GLUCOSE, CAPILLARY: Glucose-Capillary: 86 mg/dL (ref 70–99)

## 2020-11-12 ENCOUNTER — Ambulatory Visit (INDEPENDENT_AMBULATORY_CARE_PROVIDER_SITE_OTHER): Payer: Medicare HMO | Admitting: Physician Assistant

## 2020-11-12 ENCOUNTER — Other Ambulatory Visit: Payer: Self-pay

## 2020-11-12 ENCOUNTER — Encounter: Payer: Self-pay | Admitting: Orthopedic Surgery

## 2020-11-12 DIAGNOSIS — M86271 Subacute osteomyelitis, right ankle and foot: Secondary | ICD-10-CM

## 2020-11-12 NOTE — Progress Notes (Signed)
Office Visit Note   Patient: Robert Horn           Date of Birth: 12-31-1933           MRN: [id number] Visit Date: 11/12/2020              Requested by: [name], MD [address] [place],  [place] [address] PCP: [name], MD  Chief Complaint  Patient presents with   Right Foot - Pain      HPI: Patient presents today 5 days status post right transmetatarsal amputation.  He comes in today because his wound VAC had stopped functioning and was beeping.  Assessment & Plan: Visit Diagnoses: No diagnosis found.  Plan: Clean dry dressing will be applied today.  He will have this changed by his wife daily they may use antibacterial soap and water but should not soak the wound.  Follow-up in 1 week.  Follow-Up Instructions: No follow-ups on file.   Ortho Exam  Patient is alert, oriented, no adenopathy, well-dressed, normal affect, normal respiratory effort. Examination wound VAC is not functioning.  Also has an odor.  Because of this the wound VAC was removed.  He has well apposed wound edges some bloody drainage no ascending cellulitis or signs of infection  Imaging: No results found. No images are attached to the encounter.  Labs: Lab Results  Component Value Date   HGBA1C 6.9 (H) 11/09/2020   HGBA1C 7.0 (H) 01/31/2019   HGBA1C 6.4 (H) 12/13/2017   REPTSTATUS 02/04/2019 FINAL 01/30/2019   CULT  01/30/2019    NO GROWTH 5 DAYS Performed at [facility], [place] [address] [place], [place] [address]      Lab Results  Component Value Date   ALBUMIN 2.3 (L) 02/06/2019   ALBUMIN 2.2 (L) 02/05/2019   ALBUMIN 2.2 (L) 02/04/2019    Lab Results  Component Value Date   MG 2.1 02/06/2019   MG 2.3 02/05/2019   MG 2.3 02/04/2019   No results found for: VD25OH  No results found for: PREALBUMIN CBC EXTENDED Latest Ref Rng & Units 11/09/2020 11/08/2020 08/02/2019  WBC 4.0 - 10.5 K/uL 8.5 5.3 4.9  RBC 4.22 - 5.81 MIL/uL 4.44 4.51 4.19(L)  HGB  13.0 - 17.0 g/dL 13.0 13.5 12.1(L)  HCT 39.0 - 52.0 % 40.3 41.2 38.5(L)  PLT 150 - 400 K/uL PLATELET CLUMPS NOTED ON SMEAR, UNABLE TO ESTIMATE PLATELET CLUMPS NOTED ON SMEAR, UNABLE TO ESTIMATE 158  NEUTROABS 1.7 - 7.7 K/uL - - -  LYMPHSABS 0.7 - 4.0 K/uL - - -     There is no height or weight on file to calculate BMI.  Orders:  No orders of the defined types were placed in this encounter.  No orders of the defined types were placed in this encounter.    Procedures: No procedures performed  Clinical Data: No additional findings.  ROS:  All other systems negative, except as noted in the HPI. Review of Systems  Objective: Vital Signs: There were no vitals taken for this visit.  Specialty Comments:  No specialty comments available.  PMFS History: Patient Active Problem List   Diagnosis Date Noted   Right foot ulcer, limited to breakdown of skin ([place]) 02/28/2020   Osteomyelitis of second toe of right foot ([place])    Osteomyelitis of great toe of right foot (HCC)    Severe protein-calorie malnutrition (HCC)    Diabetic polyneuropathy associated with type 2 diabetes mellitus ([place])  Cutaneous abscess of right foot    Cellulitis of right foot 01/30/2019   CKD (chronic kidney disease), stage III ([place]) 01/30/2019   Elevated LFTs 01/30/2019   Colon cancer ([place]) 01/30/2019   Anemia of chronic disease 01/30/2019   Acute on chronic combined systolic and diastolic CHF (congestive heart failure) ([place]) 03/09/2018   AKI (acute kidney injury) ([place]) 03/09/2018   Colonic mass 12/10/2017   S/P TAVR (transcatheter aortic valve replacement) 03/03/2017   Severe aortic stenosis 12/15/2016   Essential hypertension 05/09/2013   Hyperlipidemia 05/09/2013   PVC's (premature ventricular contractions) 05/09/2013   Chronic combined systolic and diastolic heart failure ([place]) 05/09/2013   Past Medical History:  Diagnosis Date   Anemia    Anxiety    situational- surgery   Asthma    as a  child   Cancer ([place]) 2019   colon- colectomy    CHF (congestive heart failure) ([place])    Chronic kidney disease    followed by Dr. Delfina Redwood   Coronary artery disease    Diabetes mellitus without complication ([place])    Type II   Dyslipidemia 10/27/2015   Dyspnea    w/ exertion    Elevated PSA 10/27/2015   Erectile dysfunction 10/27/2015   Heart murmur    Hypertension 10/27/2015   Hypogonadism male 10/27/2015   Neuropathy    Obesity 10/27/2015   Peripheral vascular disease ([place])    Pneumonia 10/27/2015   pt states was 1982   Rotator cuff tear 10/27/2015    Family History  Problem Relation Age of Onset   Diabetes Mother    Heart disease Mother    Pulmonary embolism Father     Past Surgical History:  Procedure Laterality Date   ABDOMINAL AORTOGRAM N/A 02/01/2019   Procedure: ABDOMINAL AORTOGRAM;  Surgeon: Marty Heck, MD;  Location: [facility];  Service: Cardiovascular;  Laterality: N/A;   AMPUTATION Right 02/03/2019   Procedure: RIGHT FOOT 1ST RAY AMPUTATION;  Surgeon: [name], MD;  Location: [facility];  Service: Orthopedics;  Laterality: Right;   AMPUTATION Right 08/02/2019   Procedure: RIGHT 2ND TOE AMPUTATION;  Surgeon: [name], MD;  Location: [facility];  Service: Orthopedics;  Laterality: Right;   AMPUTATION Right 11/08/2020   Procedure: RIGHT TRANSMETATARSAL AMPUTATION;  Surgeon: [name], MD;  Location: [facility];  Service: Orthopedics;  Laterality: Right;   APPLICATION OF WOUND VAC  11/08/2020   Procedure: APPLICATION OF WOUND VAC;  Surgeon: [name], MD;  Location: [facility];  Service: Orthopedics;;   CARDIAC CATHETERIZATION N/A 11/14/2015   Procedure: Left Heart Cath and Coronary Angiography;  Surgeon: [name], MD;  Location: [facility];  Service: Cardiovascular;  Laterality: N/A;   COLONOSCOPY     LAPAROSCOPIC PARTIAL COLECTOMY  12/15/2017   LAPAROSCOPIC PARTIAL COLECTOMY (N/A Abdomen)   LAPAROSCOPIC PARTIAL COLECTOMY N/A 12/15/2017    Procedure: LAPAROSCOPIC PARTIAL COLECTOMY;  Surgeon: Erroll Luna, MD;  Location: [facility];  Service: General;  Laterality: N/A;   LOWER EXTREMITY ANGIOGRAPHY Right 02/01/2019   Procedure: LOWER EXTREMITY ANGIOGRAPHY;  Surgeon: Marty Heck, MD;  Location: [facility];  Service: Cardiovascular;  Laterality: Right;   PERIPHERAL VASCULAR INTERVENTION Right 02/01/2019   Procedure: PERIPHERAL VASCULAR INTERVENTION;  Surgeon: Marty Heck, MD;  Location: [facility];  Service: Cardiovascular;  Laterality: Right;  SFA   TEE WITHOUT CARDIOVERSION N/A 12/15/2016   Procedure: TRANSESOPHAGEAL ECHOCARDIOGRAM (TEE);  Surgeon: Sherren Mocha, MD;  [facility];  Service: Open Heart Surgery;  Laterality: N/A;   TRANSCATHETER AORTIC VALVE REPLACEMENT, TRANSFEMORAL N/A 12/15/2016   Procedure: TRANSCATHETER AORTIC VALVE REPLACEMENT, TRANSFEMORAL;  Surgeon: Sherren Mocha, MD;  Location: [facility];  Service: Open Heart Surgery;  Laterality: N/A;   Social History   Occupational History   Not on file  Tobacco Use   Smoking status: Former    Types: Cigarettes   Smokeless tobacco: Never  Vaping Use   Vaping Use: Never used  Substance and Sexual Activity   Alcohol use: Yes    Comment: rarely   Drug use: No   Sexual activity: Not on file

## 2020-11-15 ENCOUNTER — Encounter: Payer: Medicare HMO | Admitting: Physician Assistant

## 2020-11-21 ENCOUNTER — Other Ambulatory Visit: Payer: Self-pay

## 2020-11-21 ENCOUNTER — Ambulatory Visit (INDEPENDENT_AMBULATORY_CARE_PROVIDER_SITE_OTHER): Payer: Medicare HMO | Admitting: Orthopedic Surgery

## 2020-11-21 ENCOUNTER — Encounter: Payer: Self-pay | Admitting: Orthopedic Surgery

## 2020-11-21 DIAGNOSIS — Z89431 Acquired absence of right foot: Secondary | ICD-10-CM

## 2020-11-21 MED ORDER — NITROGLYCERIN 0.2 MG/HR TD PT24: 0.2000 mg | MEDICATED_PATCH | Freq: Every day | TRANSDERMAL | 12 refills | Status: DC

## 2020-11-21 MED ORDER — PENTOXIFYLLINE ER 400 MG PO TBCR: 400.0000 mg | EXTENDED_RELEASE_TABLET | Freq: Three times a day (TID) | ORAL | 3 refills | Status: DC

## 2020-11-21 NOTE — Progress Notes (Signed)
Office Visit Note   Patient: Robert Horn           Date of Birth: [birth year]           MRN: [id number] Visit Date: 11/21/2020              Requested by: [name], MD [address] [place],  [place] [address] PCP: [name], MD  Chief Complaint  Patient presents with   Other    11/08/20 Right transmet amp      HPI: Patient is an 87 year old gentleman who presents in follow-up status post transmetatarsal amputation there is slight wound dehiscence slight amount of drainage  Assessment & Plan: Visit Diagnoses:  1. History of transmetatarsal amputation of right foot ([place])     Plan: Patient was given instructions to minimize weightbearing we will call in a prescription for Trental and a nitroglycerin patch.  Continue Dial soap cleansing and follow-up in a week  Follow-Up Instructions: Return in about 1 week (around 11/28/2020).   Ortho Exam  Patient is alert, oriented, no adenopathy, well-dressed, normal affect, normal respiratory effort. Examination there is a slight amount of swelling in his foot there is slight wound dehiscence about a millimeter across and 10 mm in length there is no cellulitis no odor no signs of infection.  Imaging: No results found. No images are attached to the encounter.  Labs: Lab Results  Component Value Date   HGBA1C 6.9 (H) 11/09/2020   HGBA1C 7.0 (H) 01/31/2019   HGBA1C 6.4 (H) 12/13/2017   REPTSTATUS 02/04/2019 FINAL 01/30/2019   CULT  01/30/2019    NO GROWTH 5 DAYS Performed at [facility], [place] [address], [place], [place] [address]      Lab Results  Component Value Date   ALBUMIN 2.3 (L) 02/06/2019   ALBUMIN 2.2 (L) 02/05/2019   ALBUMIN 2.2 (L) 02/04/2019    Lab Results  Component Value Date   MG 2.1 02/06/2019   MG 2.3 02/05/2019   MG 2.3 02/04/2019   No results found for: VD25OH  No results found for: PREALBUMIN CBC EXTENDED Latest Ref Rng & Units 11/09/2020 11/08/2020 08/02/2019  WBC 4.0  - 10.5 K/uL 8.5 5.3 4.9  RBC 4.22 - 5.81 MIL/uL 4.44 4.51 4.19(L)  HGB 13.0 - 17.0 g/dL 13.0 13.5 12.1(L)  HCT 39.0 - 52.0 % 40.3 41.2 38.5(L)  PLT 150 - 400 K/uL PLATELET CLUMPS NOTED ON SMEAR, UNABLE TO ESTIMATE PLATELET CLUMPS NOTED ON SMEAR, UNABLE TO ESTIMATE 158  NEUTROABS 1.7 - 7.7 K/uL - - -  LYMPHSABS 0.7 - 4.0 K/uL - - -     There is no height or weight on file to calculate BMI.  Orders:  No orders of the defined types were placed in this encounter.  Meds ordered this encounter  Medications   nitroGLYCERIN (NITRODUR - DOSED IN MG/24 HR) 0.2 mg/hr patch    Sig: Place 1 patch (0.2 mg total) onto the skin daily.    Dispense:  30 patch    Refill:  12   pentoxifylline (TRENTAL) 400 MG CR tablet    Sig: Take 1 tablet (400 mg total) by mouth 3 (three) times daily with meals.    Dispense:  90 tablet    Refill:  3     Procedures: No procedures performed  Clinical Data: No additional findings.  ROS:  All other systems negative, except as noted in the HPI. Review of Systems  Objective: Vital Signs: There were  no vitals taken for this visit.  Specialty Comments:  No specialty comments available.  PMFS History: Patient Active Problem List   Diagnosis Date Noted   Right foot ulcer, limited to breakdown of skin ([place]) 02/28/2020   Osteomyelitis of second toe of right foot ([place])    Osteomyelitis of great toe of right foot (HCC)    Severe protein-calorie malnutrition ([place])    Diabetic polyneuropathy associated with type 2 diabetes mellitus ([place])    Cutaneous abscess of right foot    Cellulitis of right foot 01/30/2019   CKD (chronic kidney disease), stage III ([place]) 01/30/2019   Elevated LFTs 01/30/2019   Colon cancer ([place]) 01/30/2019   Anemia of chronic disease 01/30/2019   Acute on chronic combined systolic and diastolic CHF (congestive heart failure) ([place]) 03/09/2018   AKI (acute kidney injury) ([place]) 03/09/2018   Colonic mass 12/10/2017   S/P TAVR (transcatheter  aortic valve replacement) 03/03/2017   Severe aortic stenosis 12/15/2016   Essential hypertension 05/09/2013   Hyperlipidemia 05/09/2013   PVC's (premature ventricular contractions) 05/09/2013   Chronic combined systolic and diastolic heart failure ([place]) 05/09/2013   Past Medical History:  Diagnosis Date   Anemia    Anxiety    situational- surgery   Asthma    as a child   Cancer ([place]) 2019   colon- colectomy    CHF (congestive heart failure) ([place])    Chronic kidney disease    followed by Dr. Delfina Redwood   Coronary artery disease    Diabetes mellitus without complication ([place])    Type II   Dyslipidemia 10/27/2015   Dyspnea    w/ exertion    Elevated PSA 10/27/2015   Erectile dysfunction 10/27/2015   Heart murmur    Hypertension 10/27/2015   Hypogonadism male 10/27/2015   Neuropathy    Obesity 10/27/2015   Peripheral vascular disease ([place])    Pneumonia 10/27/2015   pt states was 1982   Rotator cuff tear 10/27/2015    Family History  Problem Relation Age of Onset   Diabetes Mother    Heart disease Mother    Pulmonary embolism Father     Past Surgical History:  Procedure Laterality Date   ABDOMINAL AORTOGRAM N/A 02/01/2019   Procedure: ABDOMINAL AORTOGRAM;  Surgeon: Marty Heck, MD;  Location: [facility];  Service: Cardiovascular;  Laterality: N/A;   AMPUTATION Right 02/03/2019   Procedure: RIGHT FOOT 1ST RAY AMPUTATION;  Surgeon: [name], MD;  Location: [facility];  Service: Orthopedics;  Laterality: Right;   AMPUTATION Right 08/02/2019   Procedure: RIGHT 2ND TOE AMPUTATION;  Surgeon: [name], MD;  Location: [facility];  Service: Orthopedics;  Laterality: Right;   AMPUTATION Right 11/08/2020   Procedure: RIGHT TRANSMETATARSAL AMPUTATION;  Surgeon: [name], MD;  Location: [facility];  Service: Orthopedics;  Laterality: Right;   APPLICATION OF WOUND VAC  11/08/2020   Procedure: APPLICATION OF WOUND VAC;  Surgeon: [name], MD;  Location: [facility];   Service: Orthopedics;;   CARDIAC CATHETERIZATION N/A 11/14/2015   Procedure: Left Heart Cath and Coronary Angiography;  Surgeon: [name], MD;  Location: [facility];  Service: Cardiovascular;  Laterality: N/A;   COLONOSCOPY     LAPAROSCOPIC PARTIAL COLECTOMY  12/15/2017   LAPAROSCOPIC PARTIAL COLECTOMY (N/A Abdomen)   LAPAROSCOPIC PARTIAL COLECTOMY N/A 12/15/2017   Procedure: LAPAROSCOPIC PARTIAL COLECTOMY;  Surgeon: Erroll Luna, MD;  Location: [facility];  Service: General;  Laterality: N/A;  LOWER EXTREMITY ANGIOGRAPHY Right 02/01/2019   Procedure: LOWER EXTREMITY ANGIOGRAPHY;  Surgeon: Marty Heck, MD;  Location: [facility];  Service: Cardiovascular;  Laterality: Right;   PERIPHERAL VASCULAR INTERVENTION Right 02/01/2019   Procedure: PERIPHERAL VASCULAR INTERVENTION;  Surgeon: Marty Heck, MD;  Location: [facility];  Service: Cardiovascular;  Laterality: Right;  SFA   TEE WITHOUT CARDIOVERSION N/A 12/15/2016   Procedure: TRANSESOPHAGEAL ECHOCARDIOGRAM (TEE);  Surgeon: Sherren Mocha, MD;  Location: [facility];  Service: Open Heart Surgery;  Laterality: N/A;   TRANSCATHETER AORTIC VALVE REPLACEMENT, TRANSFEMORAL N/A 12/15/2016   Procedure: TRANSCATHETER AORTIC VALVE REPLACEMENT, TRANSFEMORAL;  Surgeon: Sherren Mocha, MD;  Location: [facility];  Service: Open Heart Surgery;  Laterality: N/A;   Social History   Occupational History   Not on file  Tobacco Use   Smoking status: Former    Types: Cigarettes   Smokeless tobacco: Never  Vaping Use   Vaping Use: Never used  Substance and Sexual Activity   Alcohol use: Yes    Comment: rarely   Drug use: No   Sexual activity: Not on file

## 2020-11-28 ENCOUNTER — Ambulatory Visit (INDEPENDENT_AMBULATORY_CARE_PROVIDER_SITE_OTHER): Payer: Medicare HMO | Admitting: Orthopedic Surgery

## 2020-11-28 DIAGNOSIS — Z89431 Acquired absence of right foot: Secondary | ICD-10-CM

## 2020-12-01 ENCOUNTER — Other Ambulatory Visit: Payer: Self-pay | Admitting: Orthopedic Surgery

## 2020-12-03 ENCOUNTER — Encounter: Payer: Self-pay | Admitting: Orthopedic Surgery

## 2020-12-03 NOTE — Progress Notes (Signed)
Office Visit Note   Patient: Robert Horn           Date of Birth: [birth year]           MRN: [id number] Visit Date: 11/28/2020              Requested by: [name], MD [address] [place],  [place] [address] PCP: [name], MD  Chief Complaint  Patient presents with   Right Foot - Routine Post Op    11/08/20 right transmet amputation       HPI: Patient is 3 weeks status post right transmetatarsal amputation is currently nonweightbearing with a scooter and a postoperative shoe.  Assessment & Plan: Visit Diagnoses:  1. History of transmetatarsal amputation of right foot ([place])     Plan: Continue nonweightbearing continue Dial soap cleansing and wearing the compression sock.  Follow-Up Instructions: Return in about 2 weeks (around 12/12/2020).   Ortho Exam  Patient is alert, oriented, no adenopathy, well-dressed, normal affect, normal respiratory effort. Examination of the wound shows progressive healing there is still a small area of wound dehiscence with a small drop of drainage but there is no cellulitis no purulence no exposed bone or tendon.  Imaging: No results found. No images are attached to the encounter.  Labs: Lab Results  Component Value Date   HGBA1C 6.9 (H) 11/09/2020   HGBA1C 7.0 (H) 01/31/2019   HGBA1C 6.4 (H) 12/13/2017   REPTSTATUS 02/04/2019 FINAL 01/30/2019   CULT  01/30/2019    NO GROWTH 5 DAYS Performed at [facility], [place] [address], [place], [place] [address]      Lab Results  Component Value Date   ALBUMIN 2.3 (L) 02/06/2019   ALBUMIN 2.2 (L) 02/05/2019   ALBUMIN 2.2 (L) 02/04/2019    Lab Results  Component Value Date   MG 2.1 02/06/2019   MG 2.3 02/05/2019   MG 2.3 02/04/2019   No results found for: VD25OH  No results found for: PREALBUMIN CBC EXTENDED Latest Ref Rng & Units 11/09/2020 11/08/2020 08/02/2019  WBC 4.0 - 10.5 K/uL 8.5 5.3 4.9  RBC 4.22 - 5.81 MIL/uL 4.44 4.51 4.19(L)  HGB 13.0 -  17.0 g/dL 13.0 13.5 12.1(L)  HCT 39.0 - 52.0 % 40.3 41.2 38.5(L)  PLT 150 - 400 K/uL PLATELET CLUMPS NOTED ON SMEAR, UNABLE TO ESTIMATE PLATELET CLUMPS NOTED ON SMEAR, UNABLE TO ESTIMATE 158  NEUTROABS 1.7 - 7.7 K/uL - - -  LYMPHSABS 0.7 - 4.0 K/uL - - -     There is no height or weight on file to calculate BMI.  Orders:  No orders of the defined types were placed in this encounter.  No orders of the defined types were placed in this encounter.    Procedures: No procedures performed  Clinical Data: No additional findings.  ROS:  All other systems negative, except as noted in the HPI. Review of Systems  Objective: Vital Signs: There were no vitals taken for this visit.  Specialty Comments:  No specialty comments available.  PMFS History: Patient Active Problem List   Diagnosis Date Noted   Right foot ulcer, limited to breakdown of skin ([place]) 02/28/2020   Osteomyelitis of second toe of right foot ([place])    Osteomyelitis of great toe of right foot (HCC)    Severe protein-calorie malnutrition (HCC)    Diabetic polyneuropathy associated with type 2 diabetes mellitus ([place])    Cutaneous abscess of right foot    Cellulitis  of right foot 01/30/2019   CKD (chronic kidney disease), stage III ([place]) 01/30/2019   Elevated LFTs 01/30/2019   Colon cancer ([place]) 01/30/2019   Anemia of chronic disease 01/30/2019   Acute on chronic combined systolic and diastolic CHF (congestive heart failure) ([place]) 03/09/2018   AKI (acute kidney injury) ([place]) 03/09/2018   Colonic mass 12/10/2017   S/P TAVR (transcatheter aortic valve replacement) 03/03/2017   Severe aortic stenosis 12/15/2016   Essential hypertension 05/09/2013   Hyperlipidemia 05/09/2013   PVC's (premature ventricular contractions) 05/09/2013   Chronic combined systolic and diastolic heart failure ([place]) 05/09/2013   Past Medical History:  Diagnosis Date   Anemia    Anxiety    situational- surgery   Asthma    as a child    Cancer ([place]) 2019   colon- colectomy    CHF (congestive heart failure) ([place])    Chronic kidney disease    followed by Dr. Delfina Redwood   Coronary artery disease    Diabetes mellitus without complication ([place])    Type II   Dyslipidemia 10/27/2015   Dyspnea    w/ exertion    Elevated PSA 10/27/2015   Erectile dysfunction 10/27/2015   Heart murmur    Hypertension 10/27/2015   Hypogonadism male 10/27/2015   Neuropathy    Obesity 10/27/2015   Peripheral vascular disease ([place])    Pneumonia 10/27/2015   pt states was 1982   Rotator cuff tear 10/27/2015    Family History  Problem Relation Age of Onset   Diabetes Mother    Heart disease Mother    Pulmonary embolism Father     Past Surgical History:  Procedure Laterality Date   ABDOMINAL AORTOGRAM N/A 02/01/2019   Procedure: ABDOMINAL AORTOGRAM;  Surgeon: Marty Heck, MD;  Location: [facility];  Service: Cardiovascular;  Laterality: N/A;   AMPUTATION Right 02/03/2019   Procedure: RIGHT FOOT 1ST RAY AMPUTATION;  Surgeon: [name], MD;  Location: [facility];  Service: Orthopedics;  Laterality: Right;   AMPUTATION Right 08/02/2019   Procedure: RIGHT 2ND TOE AMPUTATION;  Surgeon: [name], MD;  Location: [facility];  Service: Orthopedics;  Laterality: Right;   AMPUTATION Right 11/08/2020   Procedure: RIGHT TRANSMETATARSAL AMPUTATION;  Surgeon: [name], MD;  Location: [facility];  Service: Orthopedics;  Laterality: Right;   APPLICATION OF WOUND VAC  11/08/2020   Procedure: APPLICATION OF WOUND VAC;  Surgeon: [name], MD;  Location: [facility];  Service: Orthopedics;;   CARDIAC CATHETERIZATION N/A 11/14/2015   Procedure: Left Heart Cath and Coronary Angiography;  Surgeon: [name], MD;  Location: [facility];  Service: Cardiovascular;  Laterality: N/A;   COLONOSCOPY     LAPAROSCOPIC PARTIAL COLECTOMY  12/15/2017   LAPAROSCOPIC PARTIAL COLECTOMY (N/A Abdomen)   LAPAROSCOPIC PARTIAL COLECTOMY N/A 12/15/2017    Procedure: LAPAROSCOPIC PARTIAL COLECTOMY;  Surgeon: Erroll Luna, MD;  Location: [facility];  Service: General;  Laterality: N/A;   LOWER EXTREMITY ANGIOGRAPHY Right 02/01/2019   Procedure: LOWER EXTREMITY ANGIOGRAPHY;  Surgeon: Marty Heck, MD;  Location: [facility];  Service: Cardiovascular;  Laterality: Right;   PERIPHERAL VASCULAR INTERVENTION Right 02/01/2019   Procedure: PERIPHERAL VASCULAR INTERVENTION;  Surgeon: Marty Heck, MD;  Location: [facility];  Service: Cardiovascular;  Laterality: Right;  SFA   TEE WITHOUT CARDIOVERSION N/A 12/15/2016   Procedure: TRANSESOPHAGEAL ECHOCARDIOGRAM (TEE);  Surgeon: Sherren Mocha, MD;  Location: [facility];  Service: Open Heart Surgery;  Laterality: N/A;   TRANSCATHETER AORTIC VALVE REPLACEMENT, TRANSFEMORAL N/A 12/15/2016   Procedure: TRANSCATHETER AORTIC VALVE REPLACEMENT, TRANSFEMORAL;  Surgeon: Sherren Mocha, MD;  Location: [facility];  Service: Open Heart Surgery;  Laterality: N/A;   Social History   Occupational History   Not on file  Tobacco Use   Smoking status: Former    Types: Cigarettes   Smokeless tobacco: Never  Vaping Use   Vaping Use: Never used  Substance and Sexual Activity   Alcohol use: Yes    Comment: rarely   Drug use: No   Sexual activity: Not on file

## 2020-12-04 ENCOUNTER — Ambulatory Visit: Payer: Medicare HMO | Admitting: Physician Assistant

## 2020-12-09 ENCOUNTER — Ambulatory Visit (INDEPENDENT_AMBULATORY_CARE_PROVIDER_SITE_OTHER): Payer: Medicare HMO | Admitting: Orthopedic Surgery

## 2020-12-09 VITALS — Ht 72.0 in | Wt 227.0 lb

## 2020-12-09 DIAGNOSIS — Z89431 Acquired absence of right foot: Secondary | ICD-10-CM

## 2020-12-10 ENCOUNTER — Encounter: Payer: Self-pay | Admitting: Orthopedic Surgery

## 2020-12-10 NOTE — Progress Notes (Signed)
Office Visit Note   Patient: Robert Horn           Date of Birth: 04/24/1933           MRN: [id number] Visit Date: 12/09/2020              Requested by: [name], MD [address] [place],  [place] [address] PCP: [name], MD  Chief Complaint  Patient presents with   Right Foot - Follow-up    11/08/2020 right transmet amputation      HPI: Patient is an 87 year old gentleman who presents in follow-up status post transmetatarsal amputation.  He is about 4 weeks out.  Assessment & Plan: Visit Diagnoses:  1. History of transmetatarsal amputation of right foot ([place])     Plan: We will continue with nonweightbearing continue with the nitroglycerin patch and Trental.  Recommend he continue with compression.  Follow-Up Instructions: Return in about 1 week (around 12/16/2020).   Ortho Exam  Patient is alert, oriented, no adenopathy, well-dressed, normal affect, normal respiratory effort. Examination the wound bed shows progressive improvement there is still a small area middle aspect of the incision that is not completely healed.  There is no cellulitis no odor no drainage no signs of infection.  Imaging: No results found. No images are attached to the encounter.  Labs: Lab Results  Component Value Date   HGBA1C 6.9 (H) 11/09/2020   HGBA1C 7.0 (H) 01/31/2019   HGBA1C 6.4 (H) 12/13/2017   REPTSTATUS 02/04/2019 FINAL 01/30/2019   CULT  01/30/2019    NO GROWTH 5 DAYS Performed at [facility], [place] [address], [place], [place] [address]      Lab Results  Component Value Date   ALBUMIN 2.3 (L) 02/06/2019   ALBUMIN 2.2 (L) 02/05/2019   ALBUMIN 2.2 (L) 02/04/2019    Lab Results  Component Value Date   MG 2.1 02/06/2019   MG 2.3 02/05/2019   MG 2.3 02/04/2019   No results found for: VD25OH  No results found for: PREALBUMIN CBC EXTENDED Latest Ref Rng & Units 11/09/2020 11/08/2020 08/02/2019  WBC 4.0 - 10.5 K/uL 8.5 5.3 4.9  RBC 4.22 -  5.81 MIL/uL 4.44 4.51 4.19(L)  HGB 13.0 - 17.0 g/dL 13.0 13.5 12.1(L)  HCT 39.0 - 52.0 % 40.3 41.2 38.5(L)  PLT 150 - 400 K/uL PLATELET CLUMPS NOTED ON SMEAR, UNABLE TO ESTIMATE PLATELET CLUMPS NOTED ON SMEAR, UNABLE TO ESTIMATE 158  NEUTROABS 1.7 - 7.7 K/uL - - -  LYMPHSABS 0.7 - 4.0 K/uL - - -     Body mass index is 30.79 kg/m.  Orders:  No orders of the defined types were placed in this encounter.  No orders of the defined types were placed in this encounter.    Procedures: No procedures performed  Clinical Data: No additional findings.  ROS:  All other systems negative, except as noted in the HPI. Review of Systems  Objective: Vital Signs: Ht 6' (1.829 m)   Wt 227 lb (103 kg)   BMI 30.79 kg/m   Specialty Comments:  No specialty comments available.  PMFS History: Patient Active Problem List   Diagnosis Date Noted   Right foot ulcer, limited to breakdown of skin ([place]) 02/28/2020   Osteomyelitis of second toe of right foot ([place])    Osteomyelitis of great toe of right foot (HCC)    Severe protein-calorie malnutrition (HCC)    Diabetic polyneuropathy associated with type 2 diabetes mellitus ([place])  Cutaneous abscess of right foot    Cellulitis of right foot 01/30/2019   CKD (chronic kidney disease), stage III ([place]) 01/30/2019   Elevated LFTs 01/30/2019   Colon cancer ([place]) 01/30/2019   Anemia of chronic disease 01/30/2019   Acute on chronic combined systolic and diastolic CHF (congestive heart failure) ([place]) 03/09/2018   AKI (acute kidney injury) ([place]) 03/09/2018   Colonic mass 12/10/2017   S/P TAVR (transcatheter aortic valve replacement) 03/03/2017   Severe aortic stenosis 12/15/2016   Essential hypertension 05/09/2013   Hyperlipidemia 05/09/2013   PVC's (premature ventricular contractions) 05/09/2013   Chronic combined systolic and diastolic heart failure ([place]) 05/09/2013   Past Medical History:  Diagnosis Date   Anemia    Anxiety    situational-  surgery   Asthma    as a child   Cancer ([place]) 2019   colon- colectomy    CHF (congestive heart failure) ([place])    Chronic kidney disease    followed by Dr. Delfina Redwood   Coronary artery disease    Diabetes mellitus without complication ([place])    Type II   Dyslipidemia 10/27/2015   Dyspnea    w/ exertion    Elevated PSA 10/27/2015   Erectile dysfunction 10/27/2015   Heart murmur    Hypertension 10/27/2015   Hypogonadism male 10/27/2015   Neuropathy    Obesity 10/27/2015   Peripheral vascular disease ([place])    Pneumonia 10/27/2015   pt states was 1982   Rotator cuff tear 10/27/2015    Family History  Problem Relation Age of Onset   Diabetes Mother    Heart disease Mother    Pulmonary embolism Father     Past Surgical History:  Procedure Laterality Date   ABDOMINAL AORTOGRAM N/A 02/01/2019   Procedure: ABDOMINAL AORTOGRAM;  Surgeon: Marty Heck, MD;  Location: [facility];  Service: Cardiovascular;  Laterality: N/A;   AMPUTATION Right 02/03/2019   Procedure: RIGHT FOOT 1ST RAY AMPUTATION;  Surgeon: [name], MD;  Location: [facility];  Service: Orthopedics;  Laterality: Right;   AMPUTATION Right 08/02/2019   Procedure: RIGHT 2ND TOE AMPUTATION;  Surgeon: [name], MD;  Location: [facility];  Service: Orthopedics;  Laterality: Right;   AMPUTATION Right 11/08/2020   Procedure: RIGHT TRANSMETATARSAL AMPUTATION;  Surgeon: [name], MD;  Location: [facility];  Service: Orthopedics;  Laterality: Right;   APPLICATION OF WOUND VAC  11/08/2020   Procedure: APPLICATION OF WOUND VAC;  Surgeon: [name], MD;  Location: [facility];  Service: Orthopedics;;   CARDIAC CATHETERIZATION N/A 11/14/2015   Procedure: Left Heart Cath and Coronary Angiography;  Surgeon: [name], MD;  Location: [facility];  Service: Cardiovascular;  Laterality: N/A;   COLONOSCOPY     LAPAROSCOPIC PARTIAL COLECTOMY  12/15/2017   LAPAROSCOPIC PARTIAL COLECTOMY (N/A Abdomen)   LAPAROSCOPIC  PARTIAL COLECTOMY N/A 12/15/2017   Procedure: LAPAROSCOPIC PARTIAL COLECTOMY;  Surgeon: Erroll Luna, MD;  Location: [facility];  Service: General;  Laterality: N/A;   LOWER EXTREMITY ANGIOGRAPHY Right 02/01/2019   Procedure: LOWER EXTREMITY ANGIOGRAPHY;  Surgeon: Marty Heck, MD;  Location: [facility];  Service: Cardiovascular;  Laterality: Right;   PERIPHERAL VASCULAR INTERVENTION Right 02/01/2019   Procedure: PERIPHERAL VASCULAR INTERVENTION;  Surgeon: Marty Heck, MD;  Location: [facility];  Service: Cardiovascular;  Laterality: Right;  SFA   TEE WITHOUT CARDIOVERSION N/A 12/15/2016   Procedure: TRANSESOPHAGEAL ECHOCARDIOGRAM (TEE);  Surgeon: Sherren Mocha, MD;  [facility];  Service: Open Heart Surgery;  Laterality: N/A;   TRANSCATHETER AORTIC VALVE REPLACEMENT, TRANSFEMORAL N/A 12/15/2016   Procedure: TRANSCATHETER AORTIC VALVE REPLACEMENT, TRANSFEMORAL;  Surgeon: Sherren Mocha, MD;  Location: [facility];  Service: Open Heart Surgery;  Laterality: N/A;   Social History   Occupational History   Not on file  Tobacco Use   Smoking status: Former    Types: Cigarettes   Smokeless tobacco: Never  Vaping Use   Vaping Use: Never used  Substance and Sexual Activity   Alcohol use: Yes    Comment: rarely   Drug use: No   Sexual activity: Not on file

## 2020-12-14 ENCOUNTER — Other Ambulatory Visit: Payer: Self-pay | Admitting: Interventional Cardiology

## 2020-12-16 ENCOUNTER — Ambulatory Visit (INDEPENDENT_AMBULATORY_CARE_PROVIDER_SITE_OTHER): Payer: Medicare HMO | Admitting: Orthopedic Surgery

## 2020-12-16 ENCOUNTER — Encounter: Payer: Self-pay | Admitting: Orthopedic Surgery

## 2020-12-16 VITALS — Ht 72.0 in | Wt 227.0 lb

## 2020-12-16 DIAGNOSIS — Z89431 Acquired absence of right foot: Secondary | ICD-10-CM

## 2020-12-16 NOTE — Progress Notes (Signed)
Office Visit Note   Patient: Robert Horn           Date of Birth: Dec 07, 1933           MRN: [id number] Visit Date: 12/16/2020              Requested by: [name], MD [address] [place] [address] [place],  [place] [address] PCP: [name], MD  Chief Complaint  Patient presents with   Right Foot - Follow-up    11/08/2020 right transmet amputation      HPI: Patient is seen in follow-up status post transmetatarsal amputation on the right.  Patient is 5 weeks out from surgery.  Patient states that things continue to heal well he is nonweightbearing.  Assessment & Plan: Visit Diagnoses:  1. History of transmetatarsal amputation of right foot ([place])     Plan: Continue with the Trental I will continue the nitroglycerin we will harvest the sutures continue nonweightbearing.  Patient was given a note to remain out of work nonweightbearing on the right until November 12.  Follow-Up Instructions: Return in about 2 weeks (around 12/30/2020).   Ortho Exam  Patient is alert, oriented, no adenopathy, well-dressed, normal affect, normal respiratory effort. There is no swelling of the right foot he is wearing the compression sock.   the wound continues to heal well, there is 1 area that is gaped open is 5 mm x 1 mm and 1 mm deep.  There is no drainage no odor no cellulitis.  Imaging: No results found. No images are attached to the encounter.  Labs: Lab Results  Component Value Date   HGBA1C 6.9 (H) 11/09/2020   HGBA1C 7.0 (H) 01/31/2019   HGBA1C 6.4 (H) 12/13/2017   REPTSTATUS 02/04/2019 FINAL 01/30/2019   CULT  01/30/2019    NO GROWTH 5 DAYS Performed at [facility], [place] [address], [place], [place] [address]      Lab Results  Component Value Date   ALBUMIN 2.3 (L) 02/06/2019   ALBUMIN 2.2 (L) 02/05/2019   ALBUMIN 2.2 (L) 02/04/2019    Lab Results  Component Value Date   MG 2.1 02/06/2019   MG 2.3 02/05/2019   MG 2.3 02/04/2019   No results found for:  VD25OH  No results found for: PREALBUMIN CBC EXTENDED Latest Ref Rng & Units 11/09/2020 11/08/2020 08/02/2019  WBC 4.0 - 10.5 K/uL 8.5 5.3 4.9  RBC 4.22 - 5.81 MIL/uL 4.44 4.51 4.19(L)  HGB 13.0 - 17.0 g/dL 13.0 13.5 12.1(L)  HCT 39.0 - 52.0 % 40.3 41.2 38.5(L)  PLT 150 - 400 K/uL PLATELET CLUMPS NOTED ON SMEAR, UNABLE TO ESTIMATE PLATELET CLUMPS NOTED ON SMEAR, UNABLE TO ESTIMATE 158  NEUTROABS 1.7 - 7.7 K/uL - - -  LYMPHSABS 0.7 - 4.0 K/uL - - -     Body mass index is 30.79 kg/m.  Orders:  No orders of the defined types were placed in this encounter.  No orders of the defined types were placed in this encounter.    Procedures: No procedures performed  Clinical Data: No additional findings.  ROS:  All other systems negative, except as noted in the HPI. Review of Systems  Objective: Vital Signs: Ht 6' (1.829 m)   Wt 227 lb (103 kg)   BMI 30.79 kg/m   Specialty Comments:  No specialty comments available.  PMFS History: Patient Active Problem List   Diagnosis Date Noted   Right foot ulcer, limited to breakdown of skin ([place]) 02/28/2020  Osteomyelitis of second toe of right foot (HCC)    Osteomyelitis of great toe of right foot (HCC)    Severe protein-calorie malnutrition ([place])    Diabetic polyneuropathy associated with type 2 diabetes mellitus ([place])    Cutaneous abscess of right foot    Cellulitis of right foot 01/30/2019   CKD (chronic kidney disease), stage III ([place]) 01/30/2019   Elevated LFTs 01/30/2019   Colon cancer ([place]) 01/30/2019   Anemia of chronic disease 01/30/2019   Acute on chronic combined systolic and diastolic CHF (congestive heart failure) ([place]) 03/09/2018   AKI (acute kidney injury) ([place]) 03/09/2018   Colonic mass 12/10/2017   S/P TAVR (transcatheter aortic valve replacement) 03/03/2017   Severe aortic stenosis 12/15/2016   Essential hypertension 05/09/2013   Hyperlipidemia 05/09/2013   PVC's (premature ventricular contractions) 05/09/2013    Chronic combined systolic and diastolic heart failure ([place]) 05/09/2013   Past Medical History:  Diagnosis Date   Anemia    Anxiety    situational- surgery   Asthma    as a child   Cancer ([place]) 2019   colon- colectomy    CHF (congestive heart failure) ([place])    Chronic kidney disease    followed by Dr. Delfina Redwood   Coronary artery disease    Diabetes mellitus without complication ([place])    Type II   Dyslipidemia 10/27/2015   Dyspnea    w/ exertion    Elevated PSA 10/27/2015   Erectile dysfunction 10/27/2015   Heart murmur    Hypertension 10/27/2015   Hypogonadism male 10/27/2015   Neuropathy    Obesity 10/27/2015   Peripheral vascular disease ([place])    Pneumonia 10/27/2015   pt states was 1982   Rotator cuff tear 10/27/2015    Family History  Problem Relation Age of Onset   Diabetes Mother    Heart disease Mother    Pulmonary embolism Father     Past Surgical History:  Procedure Laterality Date   ABDOMINAL AORTOGRAM N/A 02/01/2019   Procedure: ABDOMINAL AORTOGRAM;  Surgeon: Marty Heck, MD;  Location: [facility];  Service: Cardiovascular;  Laterality: N/A;   AMPUTATION Right 02/03/2019   Procedure: RIGHT FOOT 1ST RAY AMPUTATION;  Surgeon: [name], MD;  Location: [facility];  Service: Orthopedics;  Laterality: Right;   AMPUTATION Right 08/02/2019   Procedure: RIGHT 2ND TOE AMPUTATION;  Surgeon: [name], MD;  Location: [facility];  Service: Orthopedics;  Laterality: Right;   AMPUTATION Right 11/08/2020   Procedure: RIGHT TRANSMETATARSAL AMPUTATION;  Surgeon: [name], MD;  Location: [facility];  Service: Orthopedics;  Laterality: Right;   APPLICATION OF WOUND VAC  11/08/2020   Procedure: APPLICATION OF WOUND VAC;  Surgeon: [name], MD;  Location: [facility];  Service: Orthopedics;;   CARDIAC CATHETERIZATION N/A 11/14/2015   Procedure: Left Heart Cath and Coronary Angiography;  Surgeon: [name], MD;  Location: [facility];  Service:  Cardiovascular;  Laterality: N/A;   COLONOSCOPY     LAPAROSCOPIC PARTIAL COLECTOMY  12/15/2017   LAPAROSCOPIC PARTIAL COLECTOMY (N/A Abdomen)   LAPAROSCOPIC PARTIAL COLECTOMY N/A 12/15/2017   Procedure: LAPAROSCOPIC PARTIAL COLECTOMY;  Surgeon: Erroll Luna, MD;  Location: [facility];  Service: General;  Laterality: N/A;   LOWER EXTREMITY ANGIOGRAPHY Right 02/01/2019   Procedure: LOWER EXTREMITY ANGIOGRAPHY;  Surgeon: Marty Heck, MD;  Location: [facility];  Service: Cardiovascular;  Laterality: Right;   PERIPHERAL VASCULAR INTERVENTION Right 02/01/2019   Procedure: PERIPHERAL  VASCULAR INTERVENTION;  Surgeon: Marty Heck, MD;  Location: [facility];  Service: Cardiovascular;  Laterality: Right;  SFA   TEE WITHOUT CARDIOVERSION N/A 12/15/2016   Procedure: TRANSESOPHAGEAL ECHOCARDIOGRAM (TEE);  Surgeon: Sherren Mocha, MD;  Location: [facility];  Service: Open Heart Surgery;  Laterality: N/A;   TRANSCATHETER AORTIC VALVE REPLACEMENT, TRANSFEMORAL N/A 12/15/2016   Procedure: TRANSCATHETER AORTIC VALVE REPLACEMENT, TRANSFEMORAL;  Surgeon: Sherren Mocha, MD;  Location: [facility];  Service: Open Heart Surgery;  Laterality: N/A;   Social History   Occupational History   Not on file  Tobacco Use   Smoking status: Former    Types: Cigarettes   Smokeless tobacco: Never  Vaping Use   Vaping Use: Never used  Substance and Sexual Activity   Alcohol use: Yes    Comment: rarely   Drug use: No   Sexual activity: Not on file

## 2020-12-18 ENCOUNTER — Telehealth: Payer: Self-pay | Admitting: Orthopedic Surgery

## 2020-12-18 NOTE — Telephone Encounter (Signed)
Note entered per last office note. I left voicemail for patient advising note will be placed at front desk for pick up.

## 2020-12-18 NOTE — Telephone Encounter (Signed)
Pt called requesting a revised need new work note. Pt states he need work note to say he need to be completely out of work until Nov 12,2022. Please call patient about this matter and ready for pick up at [phone].

## 2020-12-27 ENCOUNTER — Other Ambulatory Visit: Payer: Self-pay | Admitting: Interventional Cardiology

## 2020-12-29 ENCOUNTER — Other Ambulatory Visit: Payer: Self-pay | Admitting: Interventional Cardiology

## 2020-12-30 ENCOUNTER — Ambulatory Visit (INDEPENDENT_AMBULATORY_CARE_PROVIDER_SITE_OTHER): Payer: Medicare HMO | Admitting: Orthopedic Surgery

## 2020-12-30 ENCOUNTER — Encounter: Payer: Self-pay | Admitting: Orthopedic Surgery

## 2020-12-30 DIAGNOSIS — Z89431 Acquired absence of right foot: Secondary | ICD-10-CM

## 2020-12-30 NOTE — Progress Notes (Signed)
Office Visit Note   Patient: Robert Horn           Date of Birth: 1933-07-28           MRN: [id number] Visit Date: 12/30/2020              Requested by: [name], MD [address] [place] [address] [place],  [place] [address] PCP: [name], MD  Chief Complaint  Patient presents with   Right Foot - Routine Post Op    11/08/20 right transmet amputation       HPI: Patient is an 87 year old gentleman who presents status post right transmetatarsal amputation he has been using nitroglycerin patches and Trental  Assessment & Plan: Visit Diagnoses:  1. History of transmetatarsal amputation of right foot ([place])     Plan: Continue with the nitroglycerin patch increase his activities as tolerated he will have his wife examine his foot to ensure the foot is healing well if there is any loss of progress he will get back on the kneeling scooter.  Patient was given a prescription for biotech for extra-depth shoes custom orthotics spacer and a carbon plate.  Follow-Up Instructions: Return in about 2 weeks (around 01/13/2021).   Ortho Exam  Patient is alert, oriented, no adenopathy, well-dressed, normal affect, normal respiratory effort. Examination the callus was pared from the surgical incision there is a very small wound opening 1 mm diameter about 10 mm in length there is no drainage no cellulitis no tenderness to palpation no signs of infection.  Imaging: No results found. No images are attached to the encounter.  Labs: Lab Results  Component Value Date   HGBA1C 6.9 (H) 11/09/2020   HGBA1C 7.0 (H) 01/31/2019   HGBA1C 6.4 (H) 12/13/2017   REPTSTATUS 02/04/2019 FINAL 01/30/2019   CULT  01/30/2019    NO GROWTH 5 DAYS Performed at [facility], [place] [address], [place], [place] [address]      Lab Results  Component Value Date   ALBUMIN 2.3 (L) 02/06/2019   ALBUMIN 2.2 (L) 02/05/2019   ALBUMIN 2.2 (L) 02/04/2019    Lab Results  Component Value Date   MG 2.1  02/06/2019   MG 2.3 02/05/2019   MG 2.3 02/04/2019   No results found for: VD25OH  No results found for: PREALBUMIN CBC EXTENDED Latest Ref Rng & Units 11/09/2020 11/08/2020 08/02/2019  WBC 4.0 - 10.5 K/uL 8.5 5.3 4.9  RBC 4.22 - 5.81 MIL/uL 4.44 4.51 4.19(L)  HGB 13.0 - 17.0 g/dL 13.0 13.5 12.1(L)  HCT 39.0 - 52.0 % 40.3 41.2 38.5(L)  PLT 150 - 400 K/uL PLATELET CLUMPS NOTED ON SMEAR, UNABLE TO ESTIMATE PLATELET CLUMPS NOTED ON SMEAR, UNABLE TO ESTIMATE 158  NEUTROABS 1.7 - 7.7 K/uL - - -  LYMPHSABS 0.7 - 4.0 K/uL - - -     There is no height or weight on file to calculate BMI.  Orders:  No orders of the defined types were placed in this encounter.  No orders of the defined types were placed in this encounter.    Procedures: No procedures performed  Clinical Data: No additional findings.  ROS:  All other systems negative, except as noted in the HPI. Review of Systems  Objective: Vital Signs: There were no vitals taken for this visit.  Specialty Comments:  No specialty comments available.  PMFS History: Patient Active Problem List   Diagnosis Date Noted   Right foot ulcer, limited to breakdown of skin ([place]) 02/28/2020  Osteomyelitis of second toe of right foot (HCC)    Osteomyelitis of great toe of right foot (HCC)    Severe protein-calorie malnutrition ([place])    Diabetic polyneuropathy associated with type 2 diabetes mellitus ([place])    Cutaneous abscess of right foot    Cellulitis of right foot 01/30/2019   CKD (chronic kidney disease), stage III ([place]) 01/30/2019   Elevated LFTs 01/30/2019   Colon cancer ([place]) 01/30/2019   Anemia of chronic disease 01/30/2019   Acute on chronic combined systolic and diastolic CHF (congestive heart failure) ([place]) 03/09/2018   AKI (acute kidney injury) ([place]) 03/09/2018   Colonic mass 12/10/2017   S/P TAVR (transcatheter aortic valve replacement) 03/03/2017   Severe aortic stenosis 12/15/2016   Essential hypertension 05/09/2013    Hyperlipidemia 05/09/2013   PVC's (premature ventricular contractions) 05/09/2013   Chronic combined systolic and diastolic heart failure ([place]) 05/09/2013   Past Medical History:  Diagnosis Date   Anemia    Anxiety    situational- surgery   Asthma    as a child   Cancer ([place]) 2019   colon- colectomy    CHF (congestive heart failure) ([place])    Chronic kidney disease    followed by Dr. Delfina Redwood   Coronary artery disease    Diabetes mellitus without complication ([place])    Type II   Dyslipidemia 10/27/2015   Dyspnea    w/ exertion    Elevated PSA 10/27/2015   Erectile dysfunction 10/27/2015   Heart murmur    Hypertension 10/27/2015   Hypogonadism male 10/27/2015   Neuropathy    Obesity 10/27/2015   Peripheral vascular disease ([place])    Pneumonia 10/27/2015   pt states was 1982   Rotator cuff tear 10/27/2015    Family History  Problem Relation Age of Onset   Diabetes Mother    Heart disease Mother    Pulmonary embolism Father     Past Surgical History:  Procedure Laterality Date   ABDOMINAL AORTOGRAM N/A 02/01/2019   Procedure: ABDOMINAL AORTOGRAM;  Surgeon: Marty Heck, MD;  Location: [facility];  Service: Cardiovascular;  Laterality: N/A;   AMPUTATION Right 02/03/2019   Procedure: RIGHT FOOT 1ST RAY AMPUTATION;  Surgeon: [name], MD;  Location: [facility];  Service: Orthopedics;  Laterality: Right;   AMPUTATION Right 08/02/2019   Procedure: RIGHT 2ND TOE AMPUTATION;  Surgeon: [name], MD;  Location: [facility];  Service: Orthopedics;  Laterality: Right;   AMPUTATION Right 11/08/2020   Procedure: RIGHT TRANSMETATARSAL AMPUTATION;  Surgeon: [name], MD;  Location: [facility];  Service: Orthopedics;  Laterality: Right;   APPLICATION OF WOUND VAC  11/08/2020   Procedure: APPLICATION OF WOUND VAC;  Surgeon: [name], MD;  Location: [facility];  Service: Orthopedics;;   CARDIAC CATHETERIZATION N/A 11/14/2015   Procedure: Left Heart Cath and Coronary  Angiography;  Surgeon: [name], MD;  Location: [facility];  Service: Cardiovascular;  Laterality: N/A;   COLONOSCOPY     LAPAROSCOPIC PARTIAL COLECTOMY  12/15/2017   LAPAROSCOPIC PARTIAL COLECTOMY (N/A Abdomen)   LAPAROSCOPIC PARTIAL COLECTOMY N/A 12/15/2017   Procedure: LAPAROSCOPIC PARTIAL COLECTOMY;  Surgeon: Erroll Luna, MD;  Location: [facility];  Service: General;  Laterality: N/A;   LOWER EXTREMITY ANGIOGRAPHY Right 02/01/2019   Procedure: LOWER EXTREMITY ANGIOGRAPHY;  Surgeon: Marty Heck, MD;  Location: [facility];  Service: Cardiovascular;  Laterality: Right;   PERIPHERAL VASCULAR INTERVENTION Right 02/01/2019   Procedure: PERIPHERAL  VASCULAR INTERVENTION;  Surgeon: Marty Heck, MD;  Location: [facility];  Service: Cardiovascular;  Laterality: Right;  SFA   TEE WITHOUT CARDIOVERSION N/A 12/15/2016   Procedure: TRANSESOPHAGEAL ECHOCARDIOGRAM (TEE);  Surgeon: Sherren Mocha, MD;  Location: [facility];  Service: Open Heart Surgery;  Laterality: N/A;   TRANSCATHETER AORTIC VALVE REPLACEMENT, TRANSFEMORAL N/A 12/15/2016   Procedure: TRANSCATHETER AORTIC VALVE REPLACEMENT, TRANSFEMORAL;  Surgeon: Sherren Mocha, MD;  Location: [facility];  Service: Open Heart Surgery;  Laterality: N/A;   Social History   Occupational History   Not on file  Tobacco Use   Smoking status: Former    Types: Cigarettes   Smokeless tobacco: Never  Vaping Use   Vaping Use: Never used  Substance and Sexual Activity   Alcohol use: Yes    Comment: rarely   Drug use: No   Sexual activity: Not on file

## 2021-01-01 ENCOUNTER — Other Ambulatory Visit: Payer: Self-pay

## 2021-01-01 ENCOUNTER — Ambulatory Visit: Payer: Medicare HMO | Admitting: Physician Assistant

## 2021-01-01 ENCOUNTER — Encounter: Payer: Self-pay | Admitting: Physician Assistant

## 2021-01-01 VITALS — BP 122/40 | HR 76 | Ht 72.0 in | Wt 233.6 lb

## 2021-01-01 DIAGNOSIS — I10 Essential (primary) hypertension: Secondary | ICD-10-CM

## 2021-01-01 DIAGNOSIS — I350 Nonrheumatic aortic (valve) stenosis: Secondary | ICD-10-CM | POA: Diagnosis not present

## 2021-01-01 DIAGNOSIS — E782 Mixed hyperlipidemia: Secondary | ICD-10-CM | POA: Diagnosis not present

## 2021-01-01 DIAGNOSIS — I5020 Unspecified systolic (congestive) heart failure: Secondary | ICD-10-CM

## 2021-01-01 DIAGNOSIS — Z89431 Acquired absence of right foot: Secondary | ICD-10-CM

## 2021-01-01 DIAGNOSIS — Z952 Presence of prosthetic heart valve: Secondary | ICD-10-CM

## 2021-01-01 MED ORDER — FUROSEMIDE 40 MG PO TABS: ORAL_TABLET | ORAL | 2 refills | Status: DC

## 2021-01-01 NOTE — Progress Notes (Signed)
Cardiology Office Note:    Date:  01/01/2021   ID:  [name], DOB [birth year], MRN [id number]  PCP:  [name], MD   [facility] Providers Cardiologist:  Sinclair Grooms, MD     Referring MD: [name], MD   Chief Complaint:  F/u for CHF, hx of TAVR    Patient Profile:   Robert Horn is a 87 y.o. male with:  HFmrEF (heart failure with mildly reduced ejection fraction)   EF 25-30 in setting of severe AS Post TAVR>>EF 45-50 Echocardiogram 01/2019:  EF 45-50 Aortic stenosis S/p TAVR in 11/2016 Chronic kidney disease  Hypertension  Hyperlipidemia  Colon CA S/p R foot amputation Diabetes mellitus  Peripheral arterial disease  S/p R SFA stent 01/2019 (Dr. Carlis Abbott)   Prior CV studies: Echocardiogram 02/01/2019 EF 45-50, GRII DD, normal RVSF, mild LAE, mild MAC, trace MR, s/p TAVR with mean gradient 27 and no PVL, trivial PI  Carotid US 11/13/2016 Bilateral ICA 1-39  Cardiac catheterization 11/14/15 There is moderate to severe left ventricular systolic dysfunction. LV end diastolic pressure is moderately elevated. The left ventricular ejection fraction is 35-45% by visual estimate. There is moderate aortic valve stenosis. There is trivial (1+) aortic regurgitation. Calcific aortic valve disease with mild aortic regurgitation and mild to moderate aortic stenosis. 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 Widely patent normal appearing coronary arteries. Estimated left ventricular ejection fraction is 30-35% with elevated end-diastolic pressure and global hypokinesis. Findings are compatible with nonischemic cardiomyopathy Mild pulmonary hypertension    History of Present Illness: [name] was last seen by Dr. [name] in 9/21.  He returns for follow-up.  Of note, he recently had right transmetatarsal amputation secondary to osteomyelitis.  He was just cleared to wear a shoe.  He still has a lot of pain in that foot.  He has not had  chest pain, significant shortness of breath, syncope, orthopnea, leg edema.        Past Medical History:  Diagnosis Date   Anemia    Anxiety    situational- surgery   Asthma    as a child   Cancer ([place]) 2019   colon- colectomy    CHF (congestive heart failure) ([place])    Chronic kidney disease    followed by Dr. Delfina Redwood   Coronary artery disease    Diabetes mellitus without complication ([place])    Type II   Dyslipidemia 10/27/2015   Dyspnea    w/ exertion    Elevated PSA 10/27/2015   Erectile dysfunction 10/27/2015   Heart murmur    Hypertension 10/27/2015   Hypogonadism male 10/27/2015   Neuropathy    Obesity 10/27/2015   Peripheral vascular disease ([place])    Pneumonia 10/27/2015   pt states was 1982   Rotator cuff tear 10/27/2015   Current Medications: Current Meds  Medication Sig   amLODipine (NORVASC) 10 MG tablet TAKE 1 TABLET BY MOUTH EVERY DAY   aspirin 81 MG EC tablet Take 81 mg by mouth daily. Swallow whole.   carvedilol (COREG) 3.125 MG tablet TAKE 1 TABLET (3.125 MG TOTAL) BY MOUTH 2 (TWO) TIMES DAILY.   Cholecalciferol (VITAMIN D) 50 MCG (2000 UT) tablet Take 2,000 Units by mouth daily.   clopidogrel (PLAVIX) 75 MG tablet TAKE 1 TABLET (75 MG TOTAL) BY MOUTH DAILY WITH BREAKFAST.   ferrous sulfate 325 (65 FE) MG tablet Take 325 mg by mouth daily with breakfast.   finasteride (PROSCAR) 5 MG  tablet Take 5 mg by mouth daily.   JARDIANCE 25 MG TABS tablet Take 25 mg by mouth daily.   Multiple Vitamin (MULTIVITAMIN WITH MINERALS) TABS tablet Take 1 tablet by mouth daily.   naproxen sodium (ALEVE) 220 MG tablet Take 220 mg by mouth daily as needed (pain).   nitroGLYCERIN (NITRODUR - DOSED IN MG/24 HR) 0.2 mg/hr patch Place 1 patch (0.2 mg total) onto the skin daily.   omega-3 acid ethyl esters (LOVAZA) 1 g capsule TAKE 1 CAPSULE BY MOUTH EVERY DAY   pentoxifylline (TRENTAL) 400 MG CR tablet Take 1 tablet (400 mg total) by mouth 3 (three) times daily with meals.    rosuvastatin (CRESTOR) 20 MG tablet Take 20 mg by mouth daily.   sacubitril-valsartan (ENTRESTO) 49-51 MG Take 1 tablet by mouth 2 (two) times daily. Please keep upcoming appt for future refills. Thank you   tamsulosin (FLOMAX) 0.4 MG CAPS capsule Take 0.4 mg by mouth daily.   VITAMIN B COMPLEX-C PO Take 1 tablet by mouth daily.   [DISCONTINUED] furosemide (LASIX) 40 MG tablet TAKE 1.5 TABLETS DAILY. PLEASE MAKE YEARLY APPT WITH DR. [name] FOR SEPTEMBER 2022 FOR FUTURE REFILLS    Allergies:   Patient has no known allergies.   Social History   Tobacco Use   Smoking status: Former    Types: Cigarettes   Smokeless tobacco: Never  Vaping Use   Vaping Use: Never used  Substance Use Topics   Alcohol use: Yes    Comment: rarely   Drug use: No    Family Hx: The patient's family history includes Diabetes in his mother; Heart disease in his mother; Pulmonary embolism in his father.  Review of Systems  Gastrointestinal:  Negative for hematochezia.  Genitourinary:  Negative for hematuria.    EKGs/Labs/Other Test Reviewed:    EKG:  EKG is not ordered today.  The ekg ordered today demonstrates n/a Electrocardiogram from 11/08/2020 was personally reviewed and demonstrates sinus rhythm, heart rate 61, first-degree AV block, PR 262, no ST-T wave changes, incomplete right bundle branch block, QTC 436  Recent Labs: 11/09/2020: BUN 24; Creatinine, Ser 1.37; Hemoglobin 13.0; Platelets PLATELET CLUMPS NOTED ON SMEAR, UNABLE TO ESTIMATE; Potassium 3.9; Sodium 137   Recent Lipid Panel Lab Results  Component Value Date/Time   CHOL 110 01/30/2019 11:50 PM   TRIG 87 01/30/2019 11:50 PM   HDL 22 (L) 01/30/2019 11:50 PM   LDLCALC 71 01/30/2019 11:50 PM   Labs obtained through [facility] - personally reviewed and interpreted: 08/02/2020: Total cholesterol 188, triglycerides 153, HDL 57, LDL 104, ALT 14, TSH 2.18  Risk Assessment/Calculations:          Physical Exam:    VS:  BP (!) 122/40   Pulse 76    Ht 6' (1.829 m)   Wt 233 lb 9.6 oz (106 kg)   SpO2 95%   BMI 31.68 kg/m     Wt Readings from Last 3 Encounters:  01/01/21 233 lb 9.6 oz (106 kg)  12/16/20 227 lb (103 kg)  12/09/20 227 lb (103 kg)    Constitutional:      Appearance: Healthy appearance. Not in distress.  Pulmonary:     Effort: Pulmonary effort is normal.     Breath sounds: No wheezing. No rales.  Cardiovascular:     Normal rate. Regularly irregular rhythm. Normal S1. Normal S2.      Murmurs: There is no murmur.  Edema:    Peripheral edema absent.  Abdominal:  Palpations: Abdomen is soft.  Musculoskeletal:     Cervical back: Neck supple. Skin:    General: Skin is warm and dry.  Neurological:     General: No focal deficit present.     Mental Status: Alert and oriented to person, place and time.     Cranial Nerves: Cranial nerves are intact.       ASSESSMENT & PLAN:   1. Severe aortic stenosis 2. S/P TAVR (transcatheter aortic valve replacement) Overall stable.  His surgery was in 2018.  His last echo was in 2020.  His mean AV gradient at that time was 27 mmHg.  I will arrange a follow-up echocardiogram prior to his next visit.  Continue SBE prophylaxis.  Follow-up with Dr. [name] as planned.  3. HFmrEF (heart failure with mildly reduced EF) EF 45-50 by echocardiogram in November 2020.  NYHA II-IIb.  Volume status appears stable.  Continue carvedilol 3.125 mg twice daily, furosemide 60 mg daily, Entresto 49/51 mg twice daily.  Obtain follow-up echocardiogram as outlined above.  4. Essential hypertension Blood pressure is well controlled.  Continue amlodipine 10 mg daily, carvedilol 3.125 mg twice daily, furosemide 60 mg daily, Entresto 49/51 mg twice daily.  5. Mixed hyperlipidemia Continue rosuvastatin 20 mg daily, omega-3 fatty acids 1 g daily.  6. S/P transmetatarsal amputation of foot, right ([place]) As noted, he is still having some pain from his amputation but this seems to be getting better.        Dispo:  Return in about 3 months (around 04/03/2021) for Routine Follow Up w/ Dr. [name] .   Medication Adjustments/Labs and Tests Ordered: Current medicines are reviewed at length with the patient today.  Concerns regarding medicines are outlined above.  Tests Ordered: Orders Placed This Encounter  Procedures   ECHOCARDIOGRAM COMPLETE   Medication Changes: Meds ordered this encounter  Medications   furosemide (LASIX) 40 MG tablet    Sig: TAKE 1.5 TABLETS DAILY.    Dispense:  135 tablet    Refill:  2   Signed, Richardson Dopp, PA-C  01/01/2021 4:56 PM    [facility] [place], [place], [place]  [address] Phone: [phone]; Fax: [phone]

## 2021-01-01 NOTE — Patient Instructions (Signed)
Medication Instructions:   Your physician recommends that you continue on your current medications as directed. Please refer to the Current Medication list given to you today.  *If you need a refill on your cardiac medications before your next appointment, please call your pharmacy*   Lab Work:  -NONE  If you have labs (blood work) drawn today and your tests are completely normal, you will receive your results only by: [place] (if you have MyChart) OR A paper copy in the mail If you have any lab test that is abnormal or we need to change your treatment, we will call you to review the results.   Testing/Procedures: Your physician has requested that you have an echocardiogram. Echocardiography is a painless test that uses sound waves to create images of your heart. It provides your doctor with information about the size and shape of your heart and how well your heart's chambers and valves are working. This procedure takes approximately one hour. There are no restrictions for this procedure.    Follow-Up: At [facility], you and your health needs are our priority.  As part of our continuing mission to provide you with exceptional heart care, we have created designated Provider Care Teams.  These Care Teams include your primary Cardiologist (physician) and Advanced Practice Providers (APPs -  Physician Assistants and Nurse Practitioners) who all work together to provide you with the care you need, when you need it.  We recommend signing up for the patient portal called "MyChart".  Sign up information is provided on this After Visit Summary.  MyChart is used to connect with patients for Virtual Visits (Telemedicine).  Patients are able to view lab/test results, encounter notes, upcoming appointments, etc.  Non-urgent messages can be sent to your provider as well.   To learn more about what you can do with MyChart, go to [email or web address].    Your next appointment:   3  month(s)  The format for your next appointment:   In Person  Provider:   Daneen Schick, MD   Other Instructions

## 2021-01-12 ENCOUNTER — Other Ambulatory Visit: Payer: Self-pay | Admitting: Interventional Cardiology

## 2021-01-16 ENCOUNTER — Ambulatory Visit (INDEPENDENT_AMBULATORY_CARE_PROVIDER_SITE_OTHER): Payer: Medicare HMO | Admitting: Orthopedic Surgery

## 2021-01-16 DIAGNOSIS — Z89431 Acquired absence of right foot: Secondary | ICD-10-CM

## 2021-01-20 ENCOUNTER — Encounter: Payer: Self-pay | Admitting: Orthopedic Surgery

## 2021-01-20 NOTE — Progress Notes (Signed)
Office Visit Note   Patient: Robert Horn           Date of Birth: 1933-08-10           MRN: [id number] Visit Date: 01/16/2021              Requested by: [name], MD [address] [place],  [place] [id number] PCP: [name], MD  Chief Complaint  Patient presents with   Right Foot - Follow-up    TMA 11/08/20      HPI: Patient is a 87 year old gentleman who presents 2 months status post right transmetatarsal amputation.  Patient states the foot is a little tender but denies any other problems.  Assessment & Plan: Visit Diagnoses:  1. History of transmetatarsal amputation of right foot [facility])     Plan: Patient will advance to orthopedic shoes a prescription was provided for [employer] for a spacer carbon plate orthotics and extra-depth shoes.  Patient may continue with the [place] and nitroglycerin as needed.  Follow-Up Instructions: Return in about 2 months (around 03/18/2021).   Ortho Exam  Patient is alert, oriented, no adenopathy, well-dressed, normal affect, normal respiratory effort. Examination the transmetatarsal amputation is well-healed the callus was pared there is no ulcers no drainage no cellulitis.  Imaging: No results found. No images are attached to the encounter.  Labs: Lab Results  Component Value Date   HGBA1C 6.9 (H) 11/09/2020   HGBA1C 7.0 (H) 01/31/2019   HGBA1C 6.4 (H) 12/13/2017   REPTSTATUS 02/04/2019 FINAL 01/30/2019   CULT  01/30/2019    NO GROWTH 5 DAYS Performed at [facility], [place] [address], [place], [place] [address]      Lab Results  Component Value Date   ALBUMIN 2.3 (L) 02/06/2019   ALBUMIN 2.2 (L) 02/05/2019   ALBUMIN 2.2 (L) 02/04/2019    Lab Results  Component Value Date   MG 2.1 02/06/2019   MG 2.3 02/05/2019   MG 2.3 02/04/2019   No results found for: VD25OH  No results found for: PREALBUMIN CBC EXTENDED Latest Ref Rng & Units 11/09/2020 11/08/2020 08/02/2019  WBC 4.0 - 10.5 K/uL 8.5  5.3 4.9  RBC 4.22 - 5.81 MIL/uL 4.44 4.51 4.19(L)  HGB 13.0 - 17.0 g/dL 13.0 13.5 12.1(L)  HCT 39.0 - 52.0 % 40.3 41.2 38.5(L)  PLT 150 - 400 K/uL PLATELET CLUMPS NOTED ON SMEAR, UNABLE TO ESTIMATE PLATELET CLUMPS NOTED ON SMEAR, UNABLE TO ESTIMATE 158  NEUTROABS 1.7 - 7.7 K/uL - - -  LYMPHSABS 0.7 - 4.0 K/uL - - -     There is no height or weight on file to calculate BMI.  Orders:  No orders of the defined types were placed in this encounter.  No orders of the defined types were placed in this encounter.    Procedures: No procedures performed  Clinical Data: No additional findings.  ROS:  All other systems negative, except as noted in the HPI. Review of Systems  Objective: Vital Signs: There were no vitals taken for this visit.  Specialty Comments:  No specialty comments available.  PMFS History: Patient Active Problem List   Diagnosis Date Noted   Right foot ulcer, limited to breakdown of skin ([place]) 02/28/2020   Osteomyelitis of second toe of right foot ([place])    Osteomyelitis of great toe of right foot ([facility])    Severe protein-calorie malnutrition ([facility])    Diabetic polyneuropathy associated with type 2 diabetes mellitus ([place])  Cutaneous abscess of right foot    Cellulitis of right foot 01/30/2019   CKD (chronic kidney disease), stage III ([place]) 01/30/2019   Elevated LFTs 01/30/2019   Colon cancer ([place]) 01/30/2019   Anemia of chronic disease 01/30/2019   Acute on chronic combined systolic and diastolic CHF (congestive heart failure) ([place]) 03/09/2018   AKI (acute kidney injury) ([place]) 03/09/2018   Colonic mass 12/10/2017   S/P TAVR (transcatheter aortic valve replacement) 03/03/2017   Severe aortic stenosis 12/15/2016   Essential hypertension 05/09/2013   Hyperlipidemia 05/09/2013   PVC's (premature ventricular contractions) 05/09/2013   Chronic combined systolic and diastolic heart failure ([place]) 05/09/2013   Past Medical History:  Diagnosis Date   Anemia     Anxiety    situational- surgery   Asthma    as a child   Cancer ([place]) 2019   colon- colectomy    CHF (congestive heart failure) ([place])    Chronic kidney disease    followed by Dr. Delfina Redwood   Coronary artery disease    Diabetes mellitus without complication ([place])    Type II   Dyslipidemia 10/27/2015   Dyspnea    w/ exertion    Elevated PSA 10/27/2015   Erectile dysfunction 10/27/2015   Heart murmur    Hypertension 10/27/2015   Hypogonadism male 10/27/2015   Neuropathy    Obesity 10/27/2015   Peripheral vascular disease ([place])    Pneumonia 10/27/2015   pt states was 1982   Rotator cuff tear 10/27/2015    Family History  Problem Relation Age of Onset   Diabetes Mother    Heart disease Mother    Pulmonary embolism Father     Past Surgical History:  Procedure Laterality Date   ABDOMINAL AORTOGRAM N/A 02/01/2019   Procedure: ABDOMINAL AORTOGRAM;  Surgeon: Marty Heck, MD;  Location: [facility];  Service: Cardiovascular;  Laterality: N/A;   AMPUTATION Right 02/03/2019   Procedure: RIGHT FOOT 1ST RAY AMPUTATION;  Surgeon: [name], MD;  Location: [facility];  Service: Orthopedics;  Laterality: Right;   AMPUTATION Right 08/02/2019   Procedure: RIGHT 2ND TOE AMPUTATION;  Surgeon: [name], MD;  Location: [facility];  Service: Orthopedics;  Laterality: Right;   AMPUTATION Right 11/08/2020   Procedure: RIGHT TRANSMETATARSAL AMPUTATION;  Surgeon: [name], MD;  Location: [facility];  Service: Orthopedics;  Laterality: Right;   APPLICATION OF WOUND VAC  11/08/2020   Procedure: APPLICATION OF WOUND VAC;  Surgeon: [name], MD;  Location: [facility];  Service: Orthopedics;;   CARDIAC CATHETERIZATION N/A 11/14/2015   Procedure: Left Heart Cath and Coronary Angiography;  Surgeon: [name], MD;  Location: [facility];  Service: Cardiovascular;  Laterality: N/A;   COLONOSCOPY     LAPAROSCOPIC PARTIAL COLECTOMY  12/15/2017   LAPAROSCOPIC PARTIAL COLECTOMY (N/A  Abdomen)   LAPAROSCOPIC PARTIAL COLECTOMY N/A 12/15/2017   Procedure: LAPAROSCOPIC PARTIAL COLECTOMY;  Surgeon: Erroll Luna, MD;  Location: [facility];  Service: General;  Laterality: N/A;   LOWER EXTREMITY ANGIOGRAPHY Right 02/01/2019   Procedure: LOWER EXTREMITY ANGIOGRAPHY;  Surgeon: Marty Heck, MD;  Location: [facility];  Service: Cardiovascular;  Laterality: Right;   PERIPHERAL VASCULAR INTERVENTION Right 02/01/2019   Procedure: PERIPHERAL VASCULAR INTERVENTION;  Surgeon: Marty Heck, MD;  Location: [facility];  Service: Cardiovascular;  Laterality: Right;  SFA   TEE WITHOUT CARDIOVERSION N/A 12/15/2016   Procedure: TRANSESOPHAGEAL ECHOCARDIOGRAM (TEE);  Surgeon: Sherren Mocha, MD;  Location: [facility];  Service: Open Heart Surgery;  Laterality: N/A;   TRANSCATHETER AORTIC VALVE REPLACEMENT, TRANSFEMORAL N/A 12/15/2016   Procedure: TRANSCATHETER AORTIC VALVE REPLACEMENT, TRANSFEMORAL;  Surgeon: Sherren Mocha, MD;  Location: [facility];  Service: Open Heart Surgery;  Laterality: N/A;   Social History   Occupational History   Not on file  Tobacco Use   Smoking status: Former    Types: Cigarettes   Smokeless tobacco: Never  Vaping Use   Vaping Use: Never used  Substance and Sexual Activity   Alcohol use: Yes    Comment: rarely   Drug use: No   Sexual activity: Not on file

## 2021-01-26 ENCOUNTER — Other Ambulatory Visit: Payer: Self-pay | Admitting: Interventional Cardiology

## 2021-01-27 ENCOUNTER — Other Ambulatory Visit: Payer: Self-pay | Admitting: Interventional Cardiology

## 2021-02-04 DIAGNOSIS — I5040 Unspecified combined systolic (congestive) and diastolic (congestive) heart failure: Secondary | ICD-10-CM | POA: Diagnosis not present

## 2021-02-04 DIAGNOSIS — I10 Essential (primary) hypertension: Secondary | ICD-10-CM | POA: Diagnosis not present

## 2021-02-04 DIAGNOSIS — E7800 Pure hypercholesterolemia, unspecified: Secondary | ICD-10-CM | POA: Diagnosis not present

## 2021-02-04 DIAGNOSIS — E1122 Type 2 diabetes mellitus with diabetic chronic kidney disease: Secondary | ICD-10-CM | POA: Diagnosis not present

## 2021-02-04 DIAGNOSIS — E1165 Type 2 diabetes mellitus with hyperglycemia: Secondary | ICD-10-CM | POA: Diagnosis not present

## 2021-02-04 DIAGNOSIS — I739 Peripheral vascular disease, unspecified: Secondary | ICD-10-CM | POA: Diagnosis not present

## 2021-02-04 DIAGNOSIS — Z952 Presence of prosthetic heart valve: Secondary | ICD-10-CM | POA: Diagnosis not present

## 2021-02-04 DIAGNOSIS — E1159 Type 2 diabetes mellitus with other circulatory complications: Secondary | ICD-10-CM | POA: Diagnosis not present

## 2021-02-04 DIAGNOSIS — N1831 Chronic kidney disease, stage 3a: Secondary | ICD-10-CM | POA: Diagnosis not present

## 2021-02-07 ENCOUNTER — Other Ambulatory Visit: Payer: Self-pay | Admitting: Interventional Cardiology

## 2021-02-11 ENCOUNTER — Other Ambulatory Visit: Payer: Self-pay | Admitting: Physician Assistant

## 2021-02-15 ENCOUNTER — Other Ambulatory Visit: Payer: Self-pay | Admitting: Orthopedic Surgery

## 2021-02-17 NOTE — Telephone Encounter (Signed)
S/p transmet amputation . This is healed how long should pt remain on trental?

## 2021-02-24 ENCOUNTER — Ambulatory Visit (HOSPITAL_COMMUNITY)
Admission: RE | Admit: 2021-02-24 | Discharge: 2021-02-24 | Disposition: A | Discharge status: Home / Self Care | Payer: Medicare HMO | Source: Ambulatory Visit | Attending: Surgery | Admitting: Surgery

## 2021-02-24 ENCOUNTER — Ambulatory Visit (INDEPENDENT_AMBULATORY_CARE_PROVIDER_SITE_OTHER)
Admission: RE | Admit: 2021-02-24 | Discharge: 2021-02-24 | Disposition: A | Discharge status: Home / Self Care | Payer: Medicare HMO | Source: Ambulatory Visit | Attending: Surgery | Admitting: Surgery

## 2021-02-24 ENCOUNTER — Other Ambulatory Visit: Payer: Self-pay

## 2021-02-24 ENCOUNTER — Ambulatory Visit: Payer: Medicare HMO | Admitting: Physician Assistant

## 2021-02-24 ENCOUNTER — Encounter: Payer: Self-pay | Admitting: Orthopedic Surgery

## 2021-02-24 ENCOUNTER — Ambulatory Visit (INDEPENDENT_AMBULATORY_CARE_PROVIDER_SITE_OTHER): Payer: Medicare HMO | Admitting: Orthopedic Surgery

## 2021-02-24 ENCOUNTER — Encounter: Payer: Self-pay | Admitting: Physician Assistant

## 2021-02-24 VITALS — BP 116/76 | HR 83 | Temp 98.0°F | Resp 20 | Ht 72.0 in | Wt 229.5 lb

## 2021-02-24 DIAGNOSIS — I739 Peripheral vascular disease, unspecified: Secondary | ICD-10-CM | POA: Diagnosis not present

## 2021-02-24 DIAGNOSIS — Z89431 Acquired absence of right foot: Secondary | ICD-10-CM

## 2021-02-24 DIAGNOSIS — S98921D Partial traumatic amputation of right foot, level unspecified, subsequent encounter: Secondary | ICD-10-CM | POA: Diagnosis not present

## 2021-02-24 DIAGNOSIS — L97511 Non-pressure chronic ulcer of other part of right foot limited to breakdown of skin: Secondary | ICD-10-CM

## 2021-02-24 NOTE — Progress Notes (Signed)
HISTORY AND PHYSICAL     CC:  follow up. Requesting Provider:  [name], MD  HPI: This is a 87 y.o. male who is here today for follow up for PAD.   He underwent right SFA angioplasty and stent placement by Dr. Carlis Abbott on 02/01/2019.  Several days later he underwent first toe ray amputation by Dr. Sharol Given.  He has since also had second toe amputation on 08/02/2019 by Dr. Sharol Given.  On 11/08/2020, he underwent right TMA and application of wound vac also by Dr. Sharol Given.    Pt was last seen in our office May 2022 and at that time, he was not having any rest pain or other tissue changes other than a sore on the plantar surface.  He was taking plavix/asa/statin daily.  He had a palpable rigth PT pulse and his arterial duplex revealed a widely patent SFA stent and diffuse tibial disease.  There was nothing further to offer pt from vascular standpoint.  He was scheduled for ABI and RLE arterial duplex in 6 months.   He was seen by Dr. Sharol Given on 01/16/2021 and at that time, his TMA was healing and he was given Rx for spacer carbon plate orthotics and extra depth shoes and to continue with Trental and NTG prn.     The pt returns today for follow up.  He states he has some tingling in his feet at night but no rest pain.  He denies any cramping in his legs with walking.  He states he developed a scab on the TMA site after he last saw Dr. Sharol Given.  He thinks it might be getting better.  It is not painful.  He continues to wear the darco shoe.    The pt is on a statin for cholesterol management.    The pt is on an aspirin.    Other AC:  Plavix The pt is on CCB, ARB, BB for hypertension.  The pt does have diabetes. Tobacco hx:  former   Past Medical History:  Diagnosis Date   Anemia    Anxiety    situational- surgery   Asthma    as a child   Cancer ([place]) 2019   colon- colectomy    CHF (congestive heart failure) ([place])    Chronic kidney disease    followed by Dr. Delfina Redwood   Coronary artery disease    Diabetes  mellitus without complication ([place])    Type II   Dyslipidemia 10/27/2015   Dyspnea    w/ exertion    Elevated PSA 10/27/2015   Erectile dysfunction 10/27/2015   Heart murmur    Hypertension 10/27/2015   Hypogonadism male 10/27/2015   Neuropathy    Obesity 10/27/2015   Peripheral vascular disease ([place])    Pneumonia 10/27/2015   pt states was 1982   Rotator cuff tear 10/27/2015    Past Surgical History:  Procedure Laterality Date   ABDOMINAL AORTOGRAM N/A 02/01/2019   Procedure: ABDOMINAL AORTOGRAM;  Surgeon: Marty Heck, MD;  Location: [facility];  Service: Cardiovascular;  Laterality: N/A;   AMPUTATION Right 02/03/2019   Procedure: RIGHT FOOT 1ST RAY AMPUTATION;  Surgeon: [name], MD;  Location: [place];  Service: Orthopedics;  Laterality: Right;   AMPUTATION Right 08/02/2019   Procedure: RIGHT 2ND TOE AMPUTATION;  Surgeon: [name], MD;  Location: [place];  Service: Orthopedics;  Laterality: Right;   AMPUTATION Right 11/08/2020   Procedure: RIGHT TRANSMETATARSAL AMPUTATION;  Surgeon: [name], MD;  Location: [place];  Service: Orthopedics;  Laterality: Right;   APPLICATION OF WOUND VAC  11/08/2020   Procedure: APPLICATION OF WOUND VAC;  Surgeon: [name], MD;  Location: [facility];  Service: Orthopedics;;   CARDIAC CATHETERIZATION N/A 11/14/2015   Procedure: Left Heart Cath and Coronary Angiography;  Surgeon: [name], MD;  Location: [facility];  Service: Cardiovascular;  Laterality: N/A;   COLONOSCOPY     LAPAROSCOPIC PARTIAL COLECTOMY  12/15/2017   LAPAROSCOPIC PARTIAL COLECTOMY (N/A Abdomen)   LAPAROSCOPIC PARTIAL COLECTOMY N/A 12/15/2017   Procedure: LAPAROSCOPIC PARTIAL COLECTOMY;  Surgeon: Erroll Luna, MD;  Location: [place];  Service: General;  Laterality: N/A;   LOWER EXTREMITY ANGIOGRAPHY Right 02/01/2019   Procedure: LOWER EXTREMITY ANGIOGRAPHY;  Surgeon: Marty Heck, MD;  Location: [facility];  Service:  Cardiovascular;  Laterality: Right;   PERIPHERAL VASCULAR INTERVENTION Right 02/01/2019   Procedure: PERIPHERAL VASCULAR INTERVENTION;  Surgeon: Marty Heck, MD;  Location: [facility];  Service: Cardiovascular;  Laterality: Right;  SFA   TEE WITHOUT CARDIOVERSION N/A 12/15/2016   Procedure: TRANSESOPHAGEAL ECHOCARDIOGRAM (TEE);  Surgeon: Sherren Mocha, MD;  Location: [facility];  Service: Open Heart Surgery;  Laterality: N/A;   TRANSCATHETER AORTIC VALVE REPLACEMENT, TRANSFEMORAL N/A 12/15/2016   Procedure: TRANSCATHETER AORTIC VALVE REPLACEMENT, TRANSFEMORAL;  Surgeon: Sherren Mocha, MD;  Location: [place];  Service: Open Heart Surgery;  Laterality: N/A;    No Known Allergies  Current Outpatient Medications  Medication Sig Dispense Refill   amLODipine (NORVASC) 10 MG tablet TAKE 1 TABLET BY MOUTH EVERY DAY 90 tablet 2   aspirin 81 MG EC tablet Take 81 mg by mouth daily. Swallow whole.     carvedilol (COREG) 3.125 MG tablet TAKE 1 TABLET (3.125 MG TOTAL) BY MOUTH 2 (TWO) TIMES DAILY. 180 tablet 3   Cholecalciferol (VITAMIN D) 50 MCG (2000 UT) tablet Take 2,000 Units by mouth daily.     clopidogrel (PLAVIX) 75 MG tablet TAKE 1 TABLET (75 MG TOTAL) BY MOUTH DAILY WITH BREAKFAST. 90 tablet 3   ferrous sulfate 325 (65 FE) MG tablet Take 325 mg by mouth daily with breakfast.     finasteride (PROSCAR) 5 MG tablet Take 5 mg by mouth daily.     furosemide (LASIX) 40 MG tablet TAKE 1.5 TABLETS DAILY. 135 tablet 2   JARDIANCE 25 MG TABS tablet Take 25 mg by mouth daily.     LOVAZA 1 g capsule TAKE 1 CAPSULE BY MOUTH EVERY DAY 90 capsule 3   Multiple Vitamin (MULTIVITAMIN WITH MINERALS) TABS tablet Take 1 tablet by mouth daily. 30 tablet 1   naproxen sodium (ALEVE) 220 MG tablet Take 220 mg by mouth daily as needed (pain).     nitroGLYCERIN (NITRODUR - DOSED IN MG/24 HR) 0.2 mg/hr patch Place 1 patch (0.2 mg total) onto the skin daily. 30 patch 12   pentoxifylline (TRENTAL) 400 MG CR tablet  TAKE 1 TABLET BY MOUTH 3 TIMES DAILY WITH MEALS. 270 tablet 1   rosuvastatin (CRESTOR) 20 MG tablet Take 20 mg by mouth daily.     sacubitril-valsartan (ENTRESTO) 49-51 MG Take 1 tablet by mouth 2 (two) times daily. 60 tablet 11   tamsulosin (FLOMAX) 0.4 MG CAPS capsule Take 0.4 mg by mouth daily.     VITAMIN B COMPLEX-C PO Take 1 tablet by mouth daily.     No current facility-administered medications for this visit.    Family History  Problem Relation Age  of Onset   Diabetes Mother    Heart disease Mother    Pulmonary embolism Father     Social History   Socioeconomic History   Marital status: Married    Spouse name: Not on file   Number of children: Not on file   Years of education: Not on file   Highest education level: Not on file  Occupational History   Not on file  Tobacco Use   Smoking status: Former    Types: Cigarettes   Smokeless tobacco: Never  Vaping Use   Vaping Use: Never used  Substance and Sexual Activity   Alcohol use: Yes    Comment: rarely   Drug use: No   Sexual activity: Not on file  Other Topics Concern   Not on file  Social History Narrative   Not on file   Social Determinants of Health   Financial Resource Strain: Not on file  Food Insecurity: Not on file  Transportation Needs: Not on file  Physical Activity: Not on file  Stress: Not on file  Social Connections: Not on file  Intimate Partner Violence: Not on file     REVIEW OF SYSTEMS:   [X]  denotes positive finding, [ ]  denotes negative finding Cardiac  Comments:  Chest pain or chest pressure:    Shortness of breath upon exertion:    Short of breath when lying flat:    Irregular heart rhythm:        Vascular    Pain in calf, thigh, or hip brought on by ambulation:    Pain in feet at night that wakes you up from your sleep:     Blood clot in your veins:    Leg swelling:         Pulmonary    Oxygen at home:    Productive cough:     Wheezing:         Neurologic    Sudden  weakness in arms or legs:     Sudden numbness in arms or legs:     Sudden onset of difficulty speaking or slurred speech:    Temporary loss of vision in one eye:     Problems with dizziness:         Gastrointestinal    Blood in stool:     Vomited blood:         Genitourinary    Burning when urinating:     Blood in urine:        Psychiatric    Major depression:         Hematologic    Bleeding problems:    Problems with blood clotting too easily:        Skin    Rashes or ulcers:        Constitutional    Fever or chills:      PHYSICAL EXAMINATION:  Today's Vitals   02/24/21 0900  BP: 116/76  Pulse: 83  Resp: 20  Temp: 98 F (36.7 C)  TempSrc: Temporal  SpO2: 98%  Weight: 229 lb 8 oz (104.1 kg)  Height: 6' (1.829 m)   Body mass index is 31.13 kg/m.   General:  WDWN in NAD; vital signs documented above Gait: Not observed HENT: WNL, normocephalic; HOH Pulmonary: normal non-labored breathing , without wheezing Cardiac: regular HR, with Murmur; without carotid bruits Abdomen: soft, NT, no masses; aortic pulse is not palpable Skin: without rashes Vascular Exam/Pulses:  Right Left  Radial 2+ (normal) 2+ (normal)  DP  Faintly palpable Monophasic doppler  PT Brisk biphasic doppler Brisk monophasic doppler  Peroneal Monophasic doppler Monophasic doppler   Extremities: right TMA with scab centrally otherwise healing; no drainage present   Musculoskeletal: no muscle wasting or atrophy  Neurologic: A&O X 3 Psychiatric:  The pt has Normal affect.   Non-Invasive Vascular Imaging:   ABI's/TBI's on 02/24/2021: Right:  1.07/na - Great toe pressure: na Left:  0.71/0.48 - Great toe pressure: 68  RLE Arterial duplex on 02/24/2021: +-----------+--------+-----+--------+----------+--------+  RIGHT      PSV cm/sRatioStenosisWaveform  Comments  +-----------+--------+-----+--------+----------+--------+  CFA Distal 88                   triphasic            +-----------+--------+-----+--------+----------+--------+  DFA        58                   biphasic            +-----------+--------+-----+--------+----------+--------+  SFA Prox   58                   biphasic            +-----------+--------+-----+--------+----------+--------+  SFA Mid    62                   biphasic            +-----------+--------+-----+--------+----------+--------+  SFA Distal 60                   biphasic            +-----------+--------+-----+--------+----------+--------+  POP Prox   45                   biphasic            +-----------+--------+-----+--------+----------+--------+  POP Distal 47                   biphasic            +-----------+--------+-----+--------+----------+--------+  ATA Distal 11                   monophasic          +-----------+--------+-----+--------+----------+--------+  PTA Distal 64                   triphasic           +-----------+--------+-----+--------+----------+--------+  PERO Distal36                   biphasic            +-----------+--------+-----+--------+----------+--------+   Right Stent(s):  +---------------+--++--------++  Prox to Stent  62biphasic  +---------------+--++--------++  Proximal Stent 72biphasic  +---------------+--++--------++  Mid Stent      91biphasic  +---------------+--++--------++  Distal Stent   83biphasic  +---------------+--++--------++  Distal to Stent70biphasic  +---------------+--++--------++      Summary:  Right: Patent stent with no stenosis.   Previous ABI's/TBI's on 08/13/2020: Right:  0.95/NA - Great toe pressure: n/a Left:  0.77/0.09 - Great toe pressure:  12  Previous arterial duplex on 08/13/2020: +-----------+--------+-----+--------+--------+------------------+  RIGHT      PSV cm/sRatioStenosisWaveformComments             +-----------+--------+-----+--------+--------+------------------+  DFA        78                   biphasic                    +-----------+--------+-----+--------+--------+------------------+  SFA Prox   125                  biphasic                    +-----------+--------+-----+--------+--------+------------------+  SFA Mid    94                   biphasic                    +-----------+--------+-----+--------+--------+------------------+  POP Prox   89                   biphasic                    +-----------+--------+-----+--------+--------+------------------+  POP Distal 78                   biphasic                    +-----------+--------+-----+--------+--------+------------------+  ATA Distal 24                   biphasicsignificant plaque  +-----------+--------+-----+--------+--------+------------------+  PTA Distal 87                   biphasic                    +-----------+--------+-----+--------+--------+------------------+  PERO Distal61                   biphasic                    +-----------+--------+-----+--------+--------+------------------+   Right Stent(s):  +---------------+--------+--------+--------+--------+  Distal SFA     PSV cm/sStenosisWaveformComments  +---------------+--------+--------+--------+--------+  Prox to Stent  102                               +---------------+--------+--------+--------+--------+  Proximal Stent 87                                +---------------+--------+--------+--------+--------+  Mid Stent      139                               +---------------+--------+--------+--------+--------+  Distal Stent   131                               +---------------+--------+--------+--------+--------+  Distal to [id number]                               +---------------+--------+--------+--------+--------+   Summary:  Right: Patent SFA stent with  no evidence of stenosis.     ASSESSMENT/PLAN:: 87 y.o. male here for follow up for PAD.   He underwent right SFA angioplasty and stent placement by Dr. Carlis Abbott on 02/01/2019.  Several days later he underwent first toe ray amputation by Dr. Sharol Given.  He has since also had second toe amputation on 08/02/2019 by Dr. Sharol Given.  On 11/08/2020, he underwent right TMA and application of wound vac also by Dr. Sharol Given.    -pt's ABI essentially unchanged and SFA stent patent without evidence of stenosis.   -given he has scab on his right  TMA, will have them move up the appt with Dr. Sharol Given from 12/20 so he can be seen sooner.  Given this, will have pt f/u in 3 months with repeat studies.  -continue statin/asa/plavix   Leontine Locket, [facility] [phone]  Clinic MD:   Trula Slade

## 2021-02-24 NOTE — Progress Notes (Signed)
Office Visit Note   Patient: Robert Horn           Date of Birth: [birth year]           MRN: [id number] Visit Date: 02/24/2021              Requested by: [name], MD [address] [place],  [place] [address] PCP: [name], MD  Chief Complaint  Patient presents with   Right Foot - Wound Check      HPI: Patient is an 87 year old gentleman who is status post transmetatarsal amputation of the left.  Patient was at the vascular surgery office today for ankle-brachial indices.  He states that he was referred to our office urgently for the ulcer over the residual limb.  Assessment & Plan: Visit Diagnoses:  1. History of transmetatarsal amputation of right foot ([place])   2. Right foot ulcer, limited to breakdown of skin ([place])     Plan: Patient will continue with his compression stocking and protective shoe.  Follow-up after the first of the year.  He will discontinue the Trental  Follow-Up Instructions: Return in about 4 weeks (around 03/24/2021).   Ortho Exam  Patient is alert, oriented, no adenopathy, well-dressed, normal affect, normal respiratory effort. Examination patient has hypertrophic callus with ulceration over the transmetatarsal amputation.  After informed consent a 10 blade knife was used to debride the skin and soft tissue back to healthy viable granulation tissue this was touched with silver nitrate.  Before debridement the ulcer is 1 cm in diameter after debridement the ulcer is 2 x 4 cm and 3 mm deep.  Patient has a good orthotic with spacer and a carbon plate.  Imaging: VAS [place] ABI WITH/WO TBI  Result Date: 02/24/2021  LOWER EXTREMITY DOPPLER STUDY Patient Name:  Robert Horn  Date of Exam:   02/24/2021 Medical Rec #: [id number]      Accession #:    [id number] Date of Birth: 1933/05/09      Patient Gender: M Patient Age:   23 years Exam Location:  [facility] Procedure:      VAS [place] ABI WITH/WO TBI Referring Phys: Dagoberto Ligas --------------------------------------------------------------------------------   Vascular Interventions: Right SFA stent on 02/01/19. Performing Technologist: Ralene Cork RVT  Examination Guidelines: A complete evaluation includes at minimum, Doppler waveform signals and systolic blood pressure reading at the level of bilateral brachial, anterior tibial, and posterior tibial arteries, when vessel segments are accessible. Bilateral testing is considered an integral part of a complete examination. Photoelectric Plethysmograph (PPG) waveforms and toe systolic pressure readings are included as required and additional duplex testing as needed. Limited examinations for reoccurring indications may be performed as noted.  ABI Findings: +---------+------------------+-----+---------+--------+ Right    Rt Pressure (mmHg)IndexWaveform Comment  +---------+------------------+-----+---------+--------+ Brachial 123                                      +---------+------------------+-----+---------+--------+ PTA      152               1.06 triphasic         +---------+------------------+-----+---------+--------+ DP       154               1.07 biphasic          +---------+------------------+-----+---------+--------+ Great Toe  amp      +---------+------------------+-----+---------+--------+ +---------+------------------+-----+----------+-------+ Left     Lt Pressure (mmHg)IndexWaveform  Comment +---------+------------------+-----+----------+-------+ Brachial 144                                      +---------+------------------+-----+----------+-------+ PTA      102               0.71 monophasic        +---------+------------------+-----+----------+-------+ DP       90                0.62 monophasic        +---------+------------------+-----+----------+-------+ Great Toe69                0.48                    +---------+------------------+-----+----------+-------+ +-------+-----------+-----------+------------+------------+ ABI/TBIToday's ABIToday's TBIPrevious ABIPrevious TBI +-------+-----------+-----------+------------+------------+ Right  1.07                  0.95                     +-------+-----------+-----------+------------+------------+ Left   0.71       0.48       0.77        0.09         +-------+-----------+-----------+------------+------------+  Previous ABI on 08/13/20.  Summary: Right: Resting right ankle-brachial index is within normal range. No evidence of significant right lower extremity arterial disease. Left: Resting left ankle-brachial index indicates moderate left lower extremity arterial disease. The left toe-brachial index is abnormal.  *See table(s) above for measurements and observations.     Preliminary    VAS [place] LOWER EXTREMITY ARTERIAL DUPLEX  Result Date: 02/24/2021 LOWER EXTREMITY ARTERIAL DUPLEX STUDY Patient Name:  Robert Horn  Date of Exam:   02/24/2021 Medical Rec #: [id number]      Accession #:    [id number] Date of Birth: May 02, 1933      Patient Gender: M Patient Age:   43 years Exam Location:  [facility] Procedure:      VAS [place] LOWER EXTREMITY ARTERIAL DUPLEX Referring Phys: Dagoberto Ligas --------------------------------------------------------------------------------   Vascular Interventions: Right SFA stent on 02/01/19. Current ABI:            Right: 1.07 Left: 0.48 Comparison Study: 08/13/20: Patent stent with no stenosis. Performing Technologist: Ralene Cork RVT  Examination Guidelines: A complete evaluation includes B-mode imaging, spectral Doppler, color Doppler, and power Doppler as needed of all accessible portions of each vessel. Bilateral testing is considered an integral part of a complete examination. Limited examinations for reoccurring indications may be performed as noted.   +-----------+--------+-----+--------+----------+--------+ RIGHT      PSV cm/sRatioStenosisWaveform  Comments +-----------+--------+-----+--------+----------+--------+ CFA Distal 88                   triphasic          +-----------+--------+-----+--------+----------+--------+ DFA        58                   biphasic           +-----------+--------+-----+--------+----------+--------+ SFA Prox   58                   biphasic           +-----------+--------+-----+--------+----------+--------+ SFA Mid    62  biphasic           +-----------+--------+-----+--------+----------+--------+ SFA Distal 60                   biphasic           +-----------+--------+-----+--------+----------+--------+ POP Prox   45                   biphasic           +-----------+--------+-----+--------+----------+--------+ POP Distal 47                   biphasic           +-----------+--------+-----+--------+----------+--------+ ATA Distal 11                   monophasic         +-----------+--------+-----+--------+----------+--------+ PTA Distal 64                   triphasic          +-----------+--------+-----+--------+----------+--------+ PERO Distal36                   biphasic           +-----------+--------+-----+--------+----------+--------+  Right Stent(s): +---------------+--++--------++ Prox to Stent  62biphasic +---------------+--++--------++ Proximal Stent 72biphasic +---------------+--++--------++ Mid Stent      91biphasic +---------------+--++--------++ Distal Stent   83biphasic +---------------+--++--------++ Distal to Stent70biphasic +---------------+--++--------++  Summary: Right: Patent stent with no stenosis.  See table(s) above for measurements and observations.    Preliminary    No images are attached to the encounter.  Labs: Lab Results  Component Value Date   HGBA1C 6.9 (H) 11/09/2020   HGBA1C  7.0 (H) 01/31/2019   HGBA1C 6.4 (H) 12/13/2017   REPTSTATUS 02/04/2019 FINAL 01/30/2019   CULT  01/30/2019    NO GROWTH 5 DAYS Performed at [facility], [place] [address], [place], [place] [address]      Lab Results  Component Value Date   ALBUMIN 2.3 (L) 02/06/2019   ALBUMIN 2.2 (L) 02/05/2019   ALBUMIN 2.2 (L) 02/04/2019    Lab Results  Component Value Date   MG 2.1 02/06/2019   MG 2.3 02/05/2019   MG 2.3 02/04/2019   No results found for: VD25OH  No results found for: PREALBUMIN CBC EXTENDED Latest Ref Rng & Units 11/09/2020 11/08/2020 08/02/2019  WBC 4.0 - 10.5 K/uL 8.5 5.3 4.9  RBC 4.22 - 5.81 MIL/uL 4.44 4.51 4.19(L)  HGB 13.0 - 17.0 g/dL 13.0 13.5 12.1(L)  HCT 39.0 - 52.0 % 40.3 41.2 38.5(L)  PLT 150 - 400 K/uL PLATELET CLUMPS NOTED ON SMEAR, UNABLE TO ESTIMATE PLATELET CLUMPS NOTED ON SMEAR, UNABLE TO ESTIMATE 158  NEUTROABS 1.7 - 7.7 K/uL - - -  LYMPHSABS 0.7 - 4.0 K/uL - - -     There is no height or weight on file to calculate BMI.  Orders:  No orders of the defined types were placed in this encounter.  No orders of the defined types were placed in this encounter.    Procedures: No procedures performed  Clinical Data: No additional findings.  ROS:  All other systems negative, except as noted in the HPI. Review of Systems  Objective: Vital Signs: There were no vitals taken for this visit.  Specialty Comments:  No specialty comments available.  PMFS History: Patient Active Problem List   Diagnosis Date Noted   Right foot ulcer, limited to breakdown of skin ([place]) 02/28/2020   Osteomyelitis of  second toe of right foot ([place])    Osteomyelitis of great toe of right foot ([facility])    Severe protein-calorie malnutrition ([place])    Diabetic polyneuropathy associated with type 2 diabetes mellitus ([place])    Cutaneous abscess of right foot    Cellulitis of right foot 01/30/2019   CKD (chronic kidney disease), stage III ([place]) 01/30/2019   Elevated LFTs  01/30/2019   Colon cancer ([place]) 01/30/2019   Anemia of chronic disease 01/30/2019   Acute on chronic combined systolic and diastolic CHF (congestive heart failure) ([place]) 03/09/2018   AKI (acute kidney injury) ([place]) 03/09/2018   Colonic mass 12/10/2017   S/P TAVR (transcatheter aortic valve replacement) 03/03/2017   Severe aortic stenosis 12/15/2016   Essential hypertension 05/09/2013   Hyperlipidemia 05/09/2013   PVC's (premature ventricular contractions) 05/09/2013   Chronic combined systolic and diastolic heart failure ([place]) 05/09/2013   Past Medical History:  Diagnosis Date   Anemia    Anxiety    situational- surgery   Asthma    as a child   Cancer ([place]) 2019   colon- colectomy    CHF (congestive heart failure) ([place])    Chronic kidney disease    followed by Dr. Delfina Redwood   Coronary artery disease    Diabetes mellitus without complication ([place])    Type II   Dyslipidemia 10/27/2015   Dyspnea    w/ exertion    Elevated PSA 10/27/2015   Erectile dysfunction 10/27/2015   Heart murmur    Hypertension 10/27/2015   Hypogonadism male 10/27/2015   Neuropathy    Obesity 10/27/2015   Peripheral vascular disease ([place])    Pneumonia 10/27/2015   pt states was 1982   Rotator cuff tear 10/27/2015    Family History  Problem Relation Age of Onset   Diabetes Mother    Heart disease Mother    Pulmonary embolism Father     Past Surgical History:  Procedure Laterality Date   ABDOMINAL AORTOGRAM N/A 02/01/2019   Procedure: ABDOMINAL AORTOGRAM;  Surgeon: Marty Heck, MD;  Location: [facility];  Service: Cardiovascular;  Laterality: N/A;   AMPUTATION Right 02/03/2019   Procedure: RIGHT FOOT 1ST RAY AMPUTATION;  Surgeon: [name], MD;  Location: [facility];  Service: Orthopedics;  Laterality: Right;   AMPUTATION Right 08/02/2019   Procedure: RIGHT 2ND TOE AMPUTATION;  Surgeon: [name], MD;  Location: [facility];  Service: Orthopedics;  Laterality: Right;   AMPUTATION  Right 11/08/2020   Procedure: RIGHT TRANSMETATARSAL AMPUTATION;  Surgeon: [name], MD;  Location: [facility];  Service: Orthopedics;  Laterality: Right;   APPLICATION OF WOUND VAC  11/08/2020   Procedure: APPLICATION OF WOUND VAC;  Surgeon: [name], MD;  Location: [facility];  Service: Orthopedics;;   CARDIAC CATHETERIZATION N/A 11/14/2015   Procedure: Left Heart Cath and Coronary Angiography;  Surgeon: [name], MD;  Location: [facility];  Service: Cardiovascular;  Laterality: N/A;   COLONOSCOPY     LAPAROSCOPIC PARTIAL COLECTOMY  12/15/2017   LAPAROSCOPIC PARTIAL COLECTOMY (N/A Abdomen)   LAPAROSCOPIC PARTIAL COLECTOMY N/A 12/15/2017   Procedure: LAPAROSCOPIC PARTIAL COLECTOMY;  Surgeon: Erroll Luna, MD;  Location: [facility];  Service: General;  Laterality: N/A;   LOWER EXTREMITY ANGIOGRAPHY Right 02/01/2019   Procedure: LOWER EXTREMITY ANGIOGRAPHY;  Surgeon: Marty Heck, MD;  Location: [facility];  Service: Cardiovascular;  Laterality: Right;   PERIPHERAL VASCULAR INTERVENTION Right 02/01/2019   Procedure: PERIPHERAL VASCULAR INTERVENTION;  Surgeon: Marty Heck, MD;  Location: [facility];  Service: Cardiovascular;  Laterality: Right;  SFA   TEE WITHOUT CARDIOVERSION N/A 12/15/2016   Procedure: TRANSESOPHAGEAL ECHOCARDIOGRAM (TEE);  Surgeon: Sherren Mocha, MD;  Location: [facility];  Service: Open Heart Surgery;  Laterality: N/A;   TRANSCATHETER AORTIC VALVE REPLACEMENT, TRANSFEMORAL N/A 12/15/2016   Procedure: TRANSCATHETER AORTIC VALVE REPLACEMENT, TRANSFEMORAL;  Surgeon: Sherren Mocha, MD;  Location: [facility];  Service: Open Heart Surgery;  Laterality: N/A;   Social History   Occupational History   Not on file  Tobacco Use   Smoking status: Former    Types: Cigarettes   Smokeless tobacco: Never  Vaping Use   Vaping Use: Never used  Substance and Sexual Activity   Alcohol use: Yes    Comment: rarely   Drug use: No   Sexual activity: Not on  file

## 2021-02-27 ENCOUNTER — Other Ambulatory Visit: Payer: Self-pay

## 2021-02-27 DIAGNOSIS — I739 Peripheral vascular disease, unspecified: Secondary | ICD-10-CM

## 2021-03-11 ENCOUNTER — Other Ambulatory Visit: Payer: Self-pay | Admitting: Interventional Cardiology

## 2021-03-12 ENCOUNTER — Observation Stay (HOSPITAL_COMMUNITY)
Admission: EM | Admit: 2021-03-12 | Discharge: 2021-03-13 | Disposition: A | Discharge status: Home / Self Care | Payer: Medicare HMO | Attending: Internal Medicine | Admitting: Internal Medicine

## 2021-03-12 ENCOUNTER — Other Ambulatory Visit: Payer: Self-pay

## 2021-03-12 ENCOUNTER — Encounter (HOSPITAL_COMMUNITY): Payer: Self-pay | Admitting: Emergency Medicine

## 2021-03-12 ENCOUNTER — Observation Stay (HOSPITAL_COMMUNITY): Payer: Medicare HMO

## 2021-03-12 ENCOUNTER — Emergency Department (HOSPITAL_COMMUNITY): Payer: Medicare HMO

## 2021-03-12 DIAGNOSIS — J45909 Unspecified asthma, uncomplicated: Secondary | ICD-10-CM | POA: Insufficient documentation

## 2021-03-12 DIAGNOSIS — I129 Hypertensive chronic kidney disease with stage 1 through stage 4 chronic kidney disease, or unspecified chronic kidney disease: Secondary | ICD-10-CM | POA: Insufficient documentation

## 2021-03-12 DIAGNOSIS — E1122 Type 2 diabetes mellitus with diabetic chronic kidney disease: Secondary | ICD-10-CM | POA: Insufficient documentation

## 2021-03-12 DIAGNOSIS — I509 Heart failure, unspecified: Secondary | ICD-10-CM | POA: Diagnosis not present

## 2021-03-12 DIAGNOSIS — I10 Essential (primary) hypertension: Secondary | ICD-10-CM | POA: Diagnosis present

## 2021-03-12 DIAGNOSIS — N183 Chronic kidney disease, stage 3 unspecified: Secondary | ICD-10-CM | POA: Diagnosis present

## 2021-03-12 DIAGNOSIS — Z79899 Other long term (current) drug therapy: Secondary | ICD-10-CM | POA: Insufficient documentation

## 2021-03-12 DIAGNOSIS — I5042 Chronic combined systolic (congestive) and diastolic (congestive) heart failure: Secondary | ICD-10-CM | POA: Diagnosis present

## 2021-03-12 DIAGNOSIS — Z7984 Long term (current) use of oral hypoglycemic drugs: Secondary | ICD-10-CM | POA: Insufficient documentation

## 2021-03-12 DIAGNOSIS — I5043 Acute on chronic combined systolic (congestive) and diastolic (congestive) heart failure: Secondary | ICD-10-CM | POA: Insufficient documentation

## 2021-03-12 DIAGNOSIS — I639 Cerebral infarction, unspecified: Secondary | ICD-10-CM | POA: Diagnosis not present

## 2021-03-12 DIAGNOSIS — E119 Type 2 diabetes mellitus without complications: Secondary | ICD-10-CM

## 2021-03-12 DIAGNOSIS — Z20822 Contact with and (suspected) exposure to covid-19: Secondary | ICD-10-CM | POA: Insufficient documentation

## 2021-03-12 DIAGNOSIS — I2510 Atherosclerotic heart disease of native coronary artery without angina pectoris: Secondary | ICD-10-CM | POA: Diagnosis not present

## 2021-03-12 DIAGNOSIS — R531 Weakness: Secondary | ICD-10-CM | POA: Diagnosis not present

## 2021-03-12 DIAGNOSIS — G459 Transient cerebral ischemic attack, unspecified: Principal | ICD-10-CM | POA: Insufficient documentation

## 2021-03-12 DIAGNOSIS — Z743 Need for continuous supervision: Secondary | ICD-10-CM | POA: Diagnosis not present

## 2021-03-12 DIAGNOSIS — R4781 Slurred speech: Secondary | ICD-10-CM | POA: Diagnosis not present

## 2021-03-12 DIAGNOSIS — Z7982 Long term (current) use of aspirin: Secondary | ICD-10-CM | POA: Insufficient documentation

## 2021-03-12 DIAGNOSIS — G319 Degenerative disease of nervous system, unspecified: Secondary | ICD-10-CM | POA: Diagnosis not present

## 2021-03-12 DIAGNOSIS — Z952 Presence of prosthetic heart valve: Secondary | ICD-10-CM

## 2021-03-12 DIAGNOSIS — Z87891 Personal history of nicotine dependence: Secondary | ICD-10-CM | POA: Diagnosis not present

## 2021-03-12 DIAGNOSIS — Z85038 Personal history of other malignant neoplasm of large intestine: Secondary | ICD-10-CM | POA: Diagnosis not present

## 2021-03-12 LAB — COMPREHENSIVE METABOLIC PANEL
ALT: 19 U/L (ref 0–44)
AST: 31 U/L (ref 15–41)
Albumin: 3.7 g/dL (ref 3.5–5.0)
Alkaline Phosphatase: 48 U/L (ref 38–126)
Anion gap: 5 (ref 5–15)
BUN: 27 mg/dL — ABNORMAL HIGH (ref 8–23)
CO2: 23 mmol/L (ref 22–32)
Calcium: 9 mg/dL (ref 8.9–10.3)
Chloride: 108 mmol/L (ref 98–111)
Creatinine, Ser: 1.89 mg/dL — ABNORMAL HIGH (ref 0.61–1.24)
GFR, Estimated: 34 mL/min — ABNORMAL LOW (ref >60)
Glucose, Bld: 157 mg/dL — ABNORMAL HIGH (ref 70–99)
Potassium: 3.9 mmol/L (ref 3.5–5.1)
Sodium: 136 mmol/L (ref 135–145)
Total Bilirubin: 0.5 mg/dL (ref 0.3–1.2)
Total Protein: 7.1 g/dL (ref 6.5–8.1)

## 2021-03-12 LAB — POCT I-STAT, CHEM 8
BUN: 31 mg/dL — ABNORMAL HIGH (ref 8–23)
Calcium, Ion: 1.13 mmol/L — ABNORMAL LOW (ref 1.15–1.40)
Chloride: 108 mmol/L (ref 98–111)
Creatinine, Ser: 1.8 mg/dL — ABNORMAL HIGH (ref 0.61–1.24)
Glucose, Bld: 150 mg/dL — ABNORMAL HIGH (ref 70–99)
HCT: 43 % (ref 39.0–52.0)
Hemoglobin: 14.6 g/dL (ref 13.0–17.0)
Potassium: 4 mmol/L (ref 3.5–5.1)
Sodium: 142 mmol/L (ref 135–145)
TCO2: 24 mmol/L (ref 22–32)

## 2021-03-12 LAB — DIFFERENTIAL
Abs Immature Granulocytes: 0.02 10*3/uL (ref 0.00–0.07)
Basophils Absolute: 0 10*3/uL (ref 0.0–0.1)
Basophils Relative: 1 %
Eosinophils Absolute: 0.1 10*3/uL (ref 0.0–0.5)
Eosinophils Relative: 1 %
Immature Granulocytes: 0 %
Lymphocytes Relative: 25 %
Lymphs Abs: 1.5 10*3/uL (ref 0.7–4.0)
Monocytes Absolute: 1.2 10*3/uL — ABNORMAL HIGH (ref 0.1–1.0)
Monocytes Relative: 21 %
Neutro Abs: 3 10*3/uL (ref 1.7–7.7)
Neutrophils Relative %: 52 %

## 2021-03-12 LAB — CBC
HCT: 41.7 % (ref 39.0–52.0)
Hemoglobin: 13.4 g/dL (ref 13.0–17.0)
MCH: 29.6 pg (ref 26.0–34.0)
MCHC: 32.1 g/dL (ref 30.0–36.0)
MCV: 92.3 fL (ref 80.0–100.0)
Platelets: UNDETERMINED 10*3/uL (ref 150–400)
RBC: 4.52 MIL/uL (ref 4.22–5.81)
RDW: 14.6 % (ref 11.5–15.5)
WBC: 5.7 10*3/uL (ref 4.0–10.5)
nRBC: 0 % (ref 0.0–0.2)

## 2021-03-12 LAB — PROTIME-INR
INR: 1 (ref 0.8–1.2)
Prothrombin Time: 13.3 seconds (ref 11.4–15.2)

## 2021-03-12 LAB — APTT: aPTT: 29 seconds (ref 24–36)

## 2021-03-12 LAB — CBG MONITORING, ED: Glucose-Capillary: 153 mg/dL — ABNORMAL HIGH (ref 70–99)

## 2021-03-12 MED ORDER — OMEGA-3-ACID ETHYL ESTERS 1 G PO CAPS
1.0000 g | ORAL_CAPSULE | Freq: Every day | ORAL | Status: DC
  Administered 2021-03-13: 1 g via ORAL
  Filled 2021-03-12: qty 1

## 2021-03-12 MED ORDER — SENNOSIDES-DOCUSATE SODIUM 8.6-50 MG PO TABS: 1.0000 | ORAL_TABLET | Freq: Every evening | ORAL | Status: DC | PRN

## 2021-03-12 MED ORDER — STROKE: EARLY STAGES OF RECOVERY BOOK: Freq: Once | Status: DC

## 2021-03-12 MED ORDER — SACUBITRIL-VALSARTAN 49-51 MG PO TABS
1.0000 | ORAL_TABLET | Freq: Two times a day (BID) | ORAL | Status: DC
  Administered 2021-03-13 (×2): 1 via ORAL
  Filled 2021-03-12 (×3): qty 1

## 2021-03-12 MED ORDER — CLOPIDOGREL BISULFATE 75 MG PO TABS: 75.0000 mg | ORAL_TABLET | Freq: Every day | ORAL | Status: DC

## 2021-03-12 MED ORDER — CARVEDILOL 3.125 MG PO TABS
3.1250 mg | ORAL_TABLET | Freq: Two times a day (BID) | ORAL | Status: DC
  Filled 2021-03-12: qty 1

## 2021-03-12 MED ORDER — ACETAMINOPHEN 160 MG/5ML PO SOLN: 650.0000 mg | ORAL | Status: DC | PRN

## 2021-03-12 MED ORDER — SODIUM CHLORIDE 0.9% FLUSH: 3.0000 mL | INTRAVENOUS | Status: DC | PRN

## 2021-03-12 MED ORDER — FUROSEMIDE 20 MG PO TABS
40.0000 mg | ORAL_TABLET | Freq: Every day | ORAL | Status: DC
  Filled 2021-03-12: qty 2

## 2021-03-12 MED ORDER — SODIUM CHLORIDE 0.9 % IV SOLN: 250.0000 mL | INTRAVENOUS | Status: DC | PRN

## 2021-03-12 MED ORDER — EMPAGLIFLOZIN 25 MG PO TABS
25.0000 mg | ORAL_TABLET | Freq: Every day | ORAL | Status: DC
  Administered 2021-03-13: 25 mg via ORAL
  Filled 2021-03-12: qty 1

## 2021-03-12 MED ORDER — TAMSULOSIN HCL 0.4 MG PO CAPS
0.4000 mg | ORAL_CAPSULE | Freq: Every day | ORAL | Status: DC
  Administered 2021-03-13: 0.4 mg via ORAL
  Filled 2021-03-12: qty 1

## 2021-03-12 MED ORDER — SODIUM CHLORIDE 0.9% FLUSH
3.0000 mL | Freq: Once | INTRAVENOUS | Status: AC
  Administered 2021-03-12: 21:00:00 3 mL via INTRAVENOUS

## 2021-03-12 MED ORDER — SODIUM CHLORIDE 0.9 % IV BOLUS
1000.0000 mL | Freq: Once | INTRAVENOUS | Status: AC
  Administered 2021-03-12: 22:00:00 1000 mL via INTRAVENOUS

## 2021-03-12 MED ORDER — ASPIRIN EC 81 MG PO TBEC: 81.0000 mg | DELAYED_RELEASE_TABLET | Freq: Every day | ORAL | Status: DC

## 2021-03-12 MED ORDER — SODIUM CHLORIDE 0.9% FLUSH
3.0000 mL | Freq: Two times a day (BID) | INTRAVENOUS | Status: DC
  Administered 2021-03-12 – 2021-03-13 (×2): 3 mL via INTRAVENOUS

## 2021-03-12 MED ORDER — ACETAMINOPHEN 325 MG PO TABS: 650.0000 mg | ORAL_TABLET | ORAL | Status: DC | PRN

## 2021-03-12 MED ORDER — AMLODIPINE BESYLATE 5 MG PO TABS: 10.0000 mg | ORAL_TABLET | Freq: Every day | ORAL | Status: DC

## 2021-03-12 MED ORDER — ACETAMINOPHEN 650 MG RE SUPP: 650.0000 mg | RECTAL | Status: DC | PRN

## 2021-03-12 NOTE — Consult Note (Signed)
Neurology Consultation  Reason for Consult: Code Stroke Referring Physician: Shirlyn Goltz, MD  CC: Acute onset L sided weakness  History is obtained from: EMS  HPI: Robert Horn is a 87 y.o. male with hx CHF, HTN, DM, HLD who was brought in by EMS as a code stroke for acute onset L facial droop, dysarthria, and L sided weakness.  LKW 19:30. NIHSS 2. Head CT negative for acute intracranial pathology. Pt was not a candidate for IV thrombolytic since his symptoms have resolved.  LKW: 19:30 tpa given?: no, resolution of symptoms Premorbid modified Rankin scale (mRS): 0 0-Completely asymptomatic and back to baseline post-stroke 1-No significant post stroke disability and can perform usual duties with stroke symptoms 2-Slight disability-UNABLE to perform all activities but does not need assistance  3-Moderate disability-requires help but walks WITHOUT assistance 4-Needs assistance to walk and tend to bodily needs 5-Severe disability-bedridden, incontinent, needs constant attention 6- Death  NIHSS 1a Level of Conscious.:  1b LOC Questions:  1c LOC Commands:  2 Best Gaze:  3 Visual:  4 Facial Palsy:  5a Motor Arm - left:  5b Motor Arm - Right:  6a Motor Leg - Left:  6b Motor Leg - Right:  7 Limb Ataxia:  8 Sensory:  9 Best Language:  10 Dysarthria:  11 Extinct. and Inatten.:  TOTAL: 2   Past Medical History:  Diagnosis Date   Anemia    Anxiety    situational- surgery   Asthma    as a child   Cancer ([place]) 2019   colon- colectomy    CHF (congestive heart failure) ([place])    Chronic kidney disease    followed by Dr. Delfina Redwood   Coronary artery disease    Diabetes mellitus without complication ([place])    Type II   Dyslipidemia 10/27/2015   Dyspnea    w/ exertion    Elevated PSA 10/27/2015   Erectile dysfunction 10/27/2015   Heart murmur    Hypertension 10/27/2015   Hypogonadism male 10/27/2015   Neuropathy    Obesity 10/27/2015   Peripheral vascular disease ([place])     Pneumonia 10/27/2015   pt states was 1982   Rotator cuff tear 10/27/2015    Family History  Problem Relation Age of Onset   Diabetes Mother    Heart disease Mother    Pulmonary embolism Father     Social History:   reports that he has quit smoking. His smoking use included cigarettes. He has never used smokeless tobacco. He reports current alcohol use. He reports that he does not use drugs.  Medications No current facility-administered medications for this encounter.  Current Outpatient Medications:    amLODipine (NORVASC) 10 MG tablet, TAKE 1 TABLET BY MOUTH EVERY DAY, Disp: 90 tablet, Rfl: 2   aspirin 81 MG EC tablet, Take 81 mg by mouth daily. Swallow whole., Disp: , Rfl:    carvedilol (COREG) 3.125 MG tablet, TAKE 1 TABLET (3.125 MG TOTAL) BY MOUTH 2 (TWO) TIMES DAILY., Disp: 180 tablet, Rfl: 3   Cholecalciferol (VITAMIN D) 50 MCG (2000 UT) tablet, Take 2,000 Units by mouth daily., Disp: , Rfl:    clopidogrel (PLAVIX) 75 MG tablet, TAKE 1 TABLET BY MOUTH DAILY WITH BREAKFAST., Disp: 90 tablet, Rfl: 3   ferrous sulfate 325 (65 FE) MG tablet, Take 325 mg by mouth daily with breakfast., Disp: , Rfl:    finasteride (PROSCAR) 5 MG tablet, Take 5 mg by mouth daily., Disp: , Rfl:    furosemide (LASIX) 40 MG  tablet, TAKE 1.5 TABLETS DAILY., Disp: 135 tablet, Rfl: 2   JARDIANCE 25 MG TABS tablet, Take 25 mg by mouth daily., Disp: , Rfl:    LOVAZA 1 g capsule, TAKE 1 CAPSULE BY MOUTH EVERY DAY, Disp: 90 capsule, Rfl: 3   Multiple Vitamin (MULTIVITAMIN WITH MINERALS) TABS tablet, Take 1 tablet by mouth daily., Disp: 30 tablet, Rfl: 1   naproxen sodium (ALEVE) 220 MG tablet, Take 220 mg by mouth daily as needed (pain)., Disp: , Rfl:    nitroGLYCERIN (NITRODUR - DOSED IN MG/24 HR) 0.2 mg/hr patch, Place 1 patch (0.2 mg total) onto the skin daily., Disp: 30 patch, Rfl: 12   pentoxifylline (TRENTAL) 400 MG CR tablet, TAKE 1 TABLET BY MOUTH 3 TIMES DAILY WITH MEALS., Disp: 270 tablet, Rfl: 1    rosuvastatin (CRESTOR) 20 MG tablet, Take 20 mg by mouth daily., Disp: , Rfl:    sacubitril-valsartan (ENTRESTO) 49-51 MG, Take 1 tablet by mouth 2 (two) times daily., Disp: 60 tablet, Rfl: 11   tamsulosin (FLOMAX) 0.4 MG CAPS capsule, Take 0.4 mg by mouth daily., Disp: , Rfl:    VITAMIN B COMPLEX-C PO, Take 1 tablet by mouth daily., Disp: , Rfl:   ROS:    General ROS: negative for - chills, fatigue, fever, night sweats, weight gain or weight loss Psychological ROS: negative for - behavioral disorder, hallucinations, memory difficulties, mood swings or suicidal ideation Ophthalmic ROS: negative for - blurry vision, double vision, eye pain or loss of vision ENT ROS: negative for - epistaxis, nasal discharge, oral lesions, sore throat, tinnitus or vertigo Allergy and Immunology ROS: negative for - hives or itchy/watery eyes Hematological and Lymphatic ROS: negative for - bleeding problems, bruising or swollen lymph nodes Endocrine ROS: negative for - galactorrhea, hair pattern changes, polydipsia/polyuria or temperature intolerance Respiratory ROS: negative for - cough, hemoptysis, shortness of breath or wheezing Cardiovascular ROS: negative for - chest pain, dyspnea on exertion, edema or irregular heartbeat Gastrointestinal ROS: negative for - abdominal pain, diarrhea, hematemesis, nausea/vomiting or stool incontinence Genito-Urinary ROS: negative for - dysuria, hematuria, incontinence or urinary frequency/urgency Musculoskeletal ROS: negative for - joint swelling or muscular weakness Neurological ROS: as noted in HPI Dermatological ROS: negative for rash and skin lesion changes  Exam: Current vital signs: BP 131/75    Pulse 61    Temp 98.4 F (36.9 C) (Oral)    Resp 10    Wt 103.2 kg    SpO2 98%    BMI 30.86 kg/m  Vital signs in last 24 hours: Temp:  [98.4 F (36.9 C)] 98.4 F (36.9 C) (12/14 2107) Pulse Rate:  [56-63] 61 (12/14 2200) Resp:  [10-20] 10 (12/14 2200) BP:  (125-143)/(75-119) 131/75 (12/14 2200) SpO2:  [97 %-99 %] 98 % (12/14 2200) Weight:  [103.2 kg] 103.2 kg (12/14 2000)   Constitutional: Appears well-developed and well-nourished.  Psych: Affect appropriate to situation Eyes: No scleral injection HENT: No OP obstrucion Head: Normocephalic.  Cardiovascular: Normal rate and regular rhythm.  Respiratory: Effort normal, non-labored breathing GI: Soft.  No distension. There is no tenderness.  Skin: WDI  Neuro:  AAOx2, fluent speech, mild dysarthria, follows commands EOMI, PERRL, no facial asymmetry Tongue and palate midline Nl bulk and tone, no drift Moves all ext antigravity Sensation grossly intact   Labs Reviewed  CBC    Component Value Date/Time   WBC 5.7 03/12/2021 2044   RBC 4.52 03/12/2021 2044   HGB 14.6 03/12/2021 2050   HGB 12.6 (L) 11/24/2018  0849   HCT 43.0 03/12/2021 2050   HCT 38.7 11/24/2018 0849   PLT PLATELET CLUMPS NOTED ON SMEAR, UNABLE TO ESTIMATE 03/12/2021 2044   PLT 82 (LL) 11/24/2018 0849   MCV 92.3 03/12/2021 2044   MCV 91 11/24/2018 0849   MCH 29.6 03/12/2021 2044   MCHC 32.1 03/12/2021 2044   RDW 14.6 03/12/2021 2044   RDW 13.8 11/24/2018 0849   LYMPHSABS 1.5 03/12/2021 2044   MONOABS 1.2 (H) 03/12/2021 2044   EOSABS 0.1 03/12/2021 2044   BASOSABS 0.0 03/12/2021 2044    CMP     Component Value Date/Time   NA 142 03/12/2021 2050   NA 139 11/24/2018 0849   K 4.0 03/12/2021 2050   CL 108 03/12/2021 2050   CO2 23 03/12/2021 2044   GLUCOSE 150 (H) 03/12/2021 2050   BUN 31 (H) 03/12/2021 2050   BUN 21 11/24/2018 0849   CREATININE 1.80 (H) 03/12/2021 2050   CREATININE 1.25 (H) 12/17/2015 0825   CALCIUM 9.0 03/12/2021 2044   PROT 7.1 03/12/2021 2044   ALBUMIN 3.7 03/12/2021 2044   AST 31 03/12/2021 2044   ALT 19 03/12/2021 2044   ALKPHOS 48 03/12/2021 2044   BILITOT 0.5 03/12/2021 2044   GFRNONAA 34 (L) 03/12/2021 2044   GFRAA 49 (L) 08/02/2019 1056    Lipid Panel     Component  Value Date/Time   CHOL 110 01/30/2019 2350   TRIG 87 01/30/2019 2350   HDL 22 (L) 01/30/2019 2350   CHOLHDL 5.0 01/30/2019 2350   VLDL 17 01/30/2019 2350   LDLCALC 71 01/30/2019 2350     Imaging  CT-scan of the brain No aucte intracranial pathology  Assessment: 87 yo M with hx CHF, HTN, DM, HLD who was brought in by EMS as a code stroke for acute onset L facial droop, dysarthria, and L sided weakness  Impression: TIA vs Stroke  Recommendations: -MRI of the brain without contrast -MRA Head and neck  -Transthoracic Echo, may need TEE and loop monitor  - cont ASA and Plavix 75 mg daily  -cont crestor -BP goal: permissive HTN upto 220/120 mmHg -HBAIC and Lipid profile -Telemetry monitoring -Frequent neuro checks -NPO until passes stroke swallow screen -PT/OT # please page stroke NP  Or  PA  Or MD from 8am -4 pm  as this patient from this time will be  followed by the stroke.   You can look them up on [email or web address]  Password [email or web address]   Total time spent 91min  Ray Church, MD Attending Neurologist

## 2021-03-12 NOTE — ED Notes (Signed)
Pt undressed and ready for MRI.

## 2021-03-12 NOTE — ED Provider Notes (Signed)
[facility] EMERGENCY DEPARTMENT Provider Note   CSN: [id number] Arrival date & time: 03/12/21  2043  An emergency department physician performed an initial assessment on this suspected stroke patient at 2045.  History Chief Complaint  Patient presents with   Code Stroke    Robert Horn is a 87 y.o. male hx of CHF, CKD, DM, here presenting with left facial droop and left arm droop and slurred speech.  Patient apparently came home around 7 PM after a long day of work.  Patient did not eat much today.  Patient was at the dinner table around 730 and had a cute onset of left facial droop.  Wife states that he seemed to be drooling out of the left side of his mouth.  Also was noted to have left arm drift and left leg drift and slurred speech.  Patient is on Plavix for CAD.  Had no previous strokes in the past  The history is provided by the patient.      Past Medical History:  Diagnosis Date   Anemia    Anxiety    situational- surgery   Asthma    as a child   Cancer ([place]) 2019   colon- colectomy    CHF (congestive heart failure) ([place])    Chronic kidney disease    followed by Dr. Delfina Redwood   Coronary artery disease    Diabetes mellitus without complication ([place])    Type II   Dyslipidemia 10/27/2015   Dyspnea    w/ exertion    Elevated PSA 10/27/2015   Erectile dysfunction 10/27/2015   Heart murmur    Hypertension 10/27/2015   Hypogonadism male 10/27/2015   Neuropathy    Obesity 10/27/2015   Peripheral vascular disease ([place])    Pneumonia 10/27/2015   pt states was 1982   Rotator cuff tear 10/27/2015    Patient Active Problem List   Diagnosis Date Noted   TIA (transient ischemic attack) 03/12/2021   Diabetes mellitus type 2, controlled ([place]) 03/12/2021   Right foot ulcer, limited to breakdown of skin ([place]) 02/28/2020   Osteomyelitis of second toe of right foot ([place])    Osteomyelitis of great toe of right foot ([facility])    Severe protein-calorie  malnutrition ([place])    Diabetic polyneuropathy associated with type 2 diabetes mellitus ([place])    Cutaneous abscess of right foot    Cellulitis of right foot 01/30/2019   CKD (chronic kidney disease), stage III ([place]) 01/30/2019   Elevated LFTs 01/30/2019   Colon cancer ([place]) 01/30/2019   Anemia of chronic disease 01/30/2019   Acute on chronic combined systolic and diastolic CHF (congestive heart failure) ([place]) 03/09/2018   AKI (acute kidney injury) ([place]) 03/09/2018   Colonic mass 12/10/2017   S/P TAVR (transcatheter aortic valve replacement) 03/03/2017   Severe aortic stenosis 12/15/2016   Essential hypertension 05/09/2013   Hyperlipidemia 05/09/2013   PVC's (premature ventricular contractions) 05/09/2013   Chronic combined systolic and diastolic heart failure ([place]) 05/09/2013    Past Surgical History:  Procedure Laterality Date   ABDOMINAL AORTOGRAM N/A 02/01/2019   Procedure: ABDOMINAL AORTOGRAM;  Surgeon: Marty Heck, MD;  Location: [facility];  Service: Cardiovascular;  Laterality: N/A;   AMPUTATION Right 02/03/2019   Procedure: RIGHT FOOT 1ST RAY AMPUTATION;  Surgeon: [name], MD;  Location: [facility];  Service: Orthopedics;  Laterality: Right;   AMPUTATION Right 08/02/2019   Procedure: RIGHT 2ND TOE AMPUTATION;  Surgeon: [name], MD;  Location: [facility];  Service: Orthopedics;  Laterality: Right;   AMPUTATION Right 11/08/2020   Procedure: RIGHT TRANSMETATARSAL AMPUTATION;  Surgeon: [name], MD;  Location: [facility];  Service: Orthopedics;  Laterality: Right;   APPLICATION OF WOUND VAC  11/08/2020   Procedure: APPLICATION OF WOUND VAC;  Surgeon: [name], MD;  Location: [facility];  Service: Orthopedics;;   CARDIAC CATHETERIZATION N/A 11/14/2015   Procedure: Left Heart Cath and Coronary Angiography;  Surgeon: [name], MD;  Location: [facility];  Service: Cardiovascular;  Laterality: N/A;   COLONOSCOPY     LAPAROSCOPIC PARTIAL COLECTOMY   12/15/2017   LAPAROSCOPIC PARTIAL COLECTOMY (N/A Abdomen)   LAPAROSCOPIC PARTIAL COLECTOMY N/A 12/15/2017   Procedure: LAPAROSCOPIC PARTIAL COLECTOMY;  Surgeon: Erroll Luna, MD;  Location: [facility];  Service: General;  Laterality: N/A;   LOWER EXTREMITY ANGIOGRAPHY Right 02/01/2019   Procedure: LOWER EXTREMITY ANGIOGRAPHY;  Surgeon: Marty Heck, MD;  Location: [facility];  Service: Cardiovascular;  Laterality: Right;   PERIPHERAL VASCULAR INTERVENTION Right 02/01/2019   Procedure: PERIPHERAL VASCULAR INTERVENTION;  Surgeon: Marty Heck, MD;  Location: [facility];  Service: Cardiovascular;  Laterality: Right;  SFA   TEE WITHOUT CARDIOVERSION N/A 12/15/2016   Procedure: TRANSESOPHAGEAL ECHOCARDIOGRAM (TEE);  Surgeon: Sherren Mocha, MD;  Location: [facility];  Service: Open Heart Surgery;  Laterality: N/A;   TRANSCATHETER AORTIC VALVE REPLACEMENT, TRANSFEMORAL N/A 12/15/2016   Procedure: TRANSCATHETER AORTIC VALVE REPLACEMENT, TRANSFEMORAL;  Surgeon: Sherren Mocha, MD;  Location: [facility];  Service: Open Heart Surgery;  Laterality: N/A;       Family History  Problem Relation Age of Onset   Diabetes Mother    Heart disease Mother    Pulmonary embolism Father     Social History   Tobacco Use   Smoking status: Former    Types: Cigarettes   Smokeless tobacco: Never  Vaping Use   Vaping Use: Never used  Substance Use Topics   Alcohol use: Yes    Comment: rarely   Drug use: No    Home Medications Prior to Admission medications   Medication Sig Start Date End Date Taking? Authorizing Provider  amLODipine (NORVASC) 10 MG tablet TAKE 1 TABLET BY MOUTH EVERY DAY 11/11/20   [name], MD  aspirin 81 MG EC tablet Take 81 mg by mouth daily. Swallow whole.    [provider]  carvedilol (COREG) 3.125 MG tablet TAKE 1 TABLET (3.125 MG TOTAL) BY MOUTH 2 (TWO) TIMES DAILY. 02/07/21   [name], MD  Cholecalciferol (VITAMIN D) 50 MCG (2000 UT) tablet  Take 2,000 Units by mouth daily.    [provider]  clopidogrel (PLAVIX) 75 MG tablet TAKE 1 TABLET BY MOUTH DAILY WITH BREAKFAST. 03/11/21   [name], MD  ferrous sulfate 325 (65 FE) MG tablet Take 325 mg by mouth daily with breakfast.    [provider]  finasteride (PROSCAR) 5 MG tablet Take 5 mg by mouth daily. 05/21/20   [provider]  furosemide (LASIX) 40 MG tablet TAKE 1.5 TABLETS DAILY. 01/01/21   Kathlen Mody, Scott T, PA-C  JARDIANCE 25 MG TABS tablet Take 25 mg by mouth daily. 12/06/19   [provider]  LOVAZA 1 g capsule TAKE 1 CAPSULE BY MOUTH EVERY DAY 01/27/21   [name], MD  Multiple Vitamin (MULTIVITAMIN WITH MINERALS) TABS tablet Take 1 tablet by mouth daily. 10/27/15   Bonnell Public, MD  naproxen sodium (ALEVE) 220 MG tablet Take 220 mg by mouth daily as needed (pain).    [provider]  nitroGLYCERIN (NITRODUR - DOSED IN MG/24 HR) 0.2 mg/hr patch Place 1 patch (0.2 mg total) onto the skin daily. 11/21/20   [name], MD  pentoxifylline (TRENTAL) 400 MG CR tablet TAKE 1 TABLET BY MOUTH 3 TIMES DAILY WITH MEALS. 02/18/21   Suzan Slick, NP  rosuvastatin (CRESTOR) 20 MG tablet Take 20 mg by mouth daily. 12/06/19   [provider]  sacubitril-valsartan (ENTRESTO) 49-51 MG Take 1 tablet by mouth 2 (two) times daily. 01/13/21   [name], MD  tamsulosin (FLOMAX) 0.4 MG CAPS capsule Take 0.4 mg by mouth daily. 05/21/20   [provider]  VITAMIN B COMPLEX-C PO Take 1 tablet by mouth daily.    [provider]    Allergies    Patient has no known allergies.  Review of Systems   Review of Systems  Neurological:  Positive for facial asymmetry, speech difficulty and weakness.  All other systems reviewed and are negative.  Physical Exam Updated Vital Signs BP (!) 137/91    Pulse 68    Temp 98.4 F (36.9 C) (Oral)    Resp 14    Wt 103.2 kg    SpO2 99%    BMI 30.86 kg/m   Physical  Exam Vitals and nursing note reviewed.  Constitutional:      Comments: Chronically ill  HENT:     Head: Normocephalic.     Nose: Nose normal.     Mouth/Throat:     Mouth: Mucous membranes are moist.  Eyes:     Extraocular Movements: Extraocular movements intact.     Pupils: Pupils are equal, round, and reactive to light.  Cardiovascular:     Rate and Rhythm: Normal rate and regular rhythm.     Pulses: Normal pulses.     Heart sounds: Normal heart sounds.  Pulmonary:     Effort: Pulmonary effort is normal.     Breath sounds: Normal breath sounds.  Abdominal:     General: Abdomen is flat.     Palpations: Abdomen is soft.  Musculoskeletal:        General: Normal range of motion.     Cervical back: Normal range of motion and neck supple.  Skin:    General: Skin is warm.     Capillary Refill: Capillary refill takes less than 2 seconds.  Neurological:     Mental Status: He is alert.     Comments: Patient has no obvious facial droop.  Patient has 4/5 left bicep flexion, patient has 5 out of 5 strength in left leg and right leg and right arm.  Patient has normal finger-to-nose bilaterally.   Psychiatric:        Mood and Affect: Mood normal.        Behavior: Behavior normal.    ED Results / Procedures / Treatments   Labs (all labs ordered are listed, but only abnormal results are displayed) Labs Reviewed  DIFFERENTIAL - Abnormal; Notable for the following components:      Result Value   Monocytes Absolute 1.2 (*)    All other components within normal limits  COMPREHENSIVE METABOLIC PANEL - Abnormal; Notable for the following components:   Glucose, Bld 157 (*)    BUN 27 (*)    Creatinine, Ser 1.89 (*)    GFR, Estimated 34 (*)    All other components within normal  limits  CBG MONITORING, ED - Abnormal; Notable for the following components:   Glucose-Capillary 153 (*)    All other components within normal limits  POCT I-STAT, CHEM 8 - Abnormal; Notable for the following  components:   BUN 31 (*)    Creatinine, Ser 1.80 (*)    Glucose, Bld 150 (*)    Calcium, Ion 1.13 (*)    All other components within normal limits  RESP PANEL BY RT-PCR (FLU A&B, COVID) ARPGX2  PROTIME-INR  APTT  CBC  HEMOGLOBIN A1C  LIPID PANEL  I-STAT CHEM 8, ED    EKG None  Radiology CT HEAD CODE STROKE WO CONTRAST  Result Date: 03/12/2021 CLINICAL DATA:  Code stroke. EXAM: CT HEAD WITHOUT CONTRAST TECHNIQUE: Contiguous axial images were obtained from the base of the skull through the vertex without intravenous contrast. COMPARISON:  None. FINDINGS: Brain: No evidence of acute infarction, hemorrhage, cerebral edema, mass, mass effect, or midline shift. Ventricles and sulci are normal for age. No extra-axial fluid collection. Periventricular white matter changes, likely the sequela of chronic small vessel ischemic disease. Generalized atrophy, without lobar predominance. Vascular: No hyperdense vessel or unexpected calcification. Skull: Normal. Negative for fracture or focal lesion. Sinuses/Orbits: No acute finding. Other: The mastoid air cells are well aerated. ASPECTS [facility] Stroke Program Early CT Score) - Ganglionic level infarction (caudate, lentiform nuclei, internal capsule, insula, M1-M3 cortex): 7 - Supraganglionic infarction (M4-M6 cortex): 3 Total score (0-10 with 10 being normal): 10 IMPRESSION: 1. No acute intracranial process. 2. ASPECTS is 10 Code stroke imaging results were communicated on 03/12/2021 at 9:04 pm to provider [place] via secure text paging. Electronically Signed   By: Merilyn Baba M.D.   On: 03/12/2021 21:04    Procedures Procedures   Medications Ordered in ED Medications  aspirin EC tablet 81 mg (has no administration in time range)  amLODipine (NORVASC) tablet 10 mg (has no administration in time range)  carvedilol (COREG) tablet 3.125 mg (has no administration in time range)  furosemide (LASIX) tablet 40 mg (has no administration in time range)   omega-3 acid ethyl esters (LOVAZA) capsule 1 g (has no administration in time range)  sacubitril-valsartan (ENTRESTO) 49-51 mg per tablet (has no administration in time range)  empagliflozin (JARDIANCE) tablet 25 mg (has no administration in time range)  tamsulosin (FLOMAX) capsule 0.4 mg (has no administration in time range)  clopidogrel (PLAVIX) tablet 75 mg (has no administration in time range)   stroke: mapping our early stages of recovery book (0 each Does not apply Hold 03/12/21 2326)  acetaminophen (TYLENOL) tablet 650 mg (has no administration in time range)    Or  acetaminophen (TYLENOL) 160 MG/5ML solution 650 mg (has no administration in time range)    Or  acetaminophen (TYLENOL) suppository 650 mg (has no administration in time range)  senna-docusate (Senokot-S) tablet 1 tablet (has no administration in time range)  sodium chloride flush (NS) 0.9 % injection 3 mL (3 mLs Intravenous Given 03/12/21 2326)  sodium chloride flush (NS) 0.9 % injection 3 mL (has no administration in time range)  0.9 %  sodium chloride infusion (has no administration in time range)  sodium chloride flush (NS) 0.9 % injection 3 mL (3 mLs Intravenous Given 03/12/21 2125)  sodium chloride 0.9 % bolus 1,000 mL (0 mLs Intravenous Stopped 03/12/21 2310)    ED Course  I have reviewed the triage vital signs and the nursing notes.  Pertinent labs & imaging results that were available during my  care of the patient were reviewed by me and considered in my medical decision making (see chart for details).    MDM Rules/Calculators/A&P                           Robert Horn is a 87 y.o. male here with slurred speech and possible left facial droop and left sided weakness.  Patient's facial droop has resolved.  Patient now has only mild left-sided weakness on my exam. Code stroke was activated by EMS.    10 PM Patient's CT head unremarkable.  Labs showed mild AKI.  Patient received IV fluids.  Per neurology,  patient needs to be admitted for stroke work-up.   Final Clinical Impression(s) / ED Diagnoses Final diagnoses:  None    Rx / DC Orders ED Discharge Orders     None        Drenda Freeze, MD 03/12/21 2332

## 2021-03-12 NOTE — ED Notes (Signed)
Family updated as to patient's status and at bedside with pt at this time.

## 2021-03-12 NOTE — ED Triage Notes (Signed)
Presents from home for L facial droop, L arm drift, L leg drift, confusion, slurred speech. LSW 1930. En route, sx cleared with exception of L facial droop and slurred speech. Takes plavix.

## 2021-03-12 NOTE — H&P (Signed)
History and Physical    Robert Horn:[id number] DOB: 08-Aug-1933 DOA: 03/12/2021  PCP: [name], MD   Patient coming from: Home  Chief Complaint:   Slurred speech and drooping of face  HPI: Robert Horn is a 87 y.o. male with medical history significant for DMT2, CHF, HTN, CAD, amputation part of right foot, cardiac valve replacement who presented for evaluation of sudden onset of drooping face and slurred speech.  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  He arrived home around 7:00 and around 730 he was eating dinner with his wife when she noticed he had slurred speech with some mild left facial droop and a difficulty getting his spoon to his mouth.  He immediately called 911 and by the time he arrived patient had resolution of the drooping face and his speech was returned to normal.  He was brought to the emergency room as a code stroke and evaluated by neurology.  CT of the head was negative for hemorrhage or acute intracranial pathology.  He states that this time he feels well and at his baseline level.  Not any recent fever or illness.  Ports he has not been sleeping well with his current work schedule.   ED Course: [name] has been hemodynamically stable in the emergency room.  Symptoms have resolved at this time and patient was not a candidate for IV thrombolytic therapy per neurology.  CBC is unremarkable.  Sodium 142 potassium 4.0 chloride 108 creatinine 1.80 from baseline of 1.35-1.45.  BUN 31 glucose 150, INR 1.0.  Neurology recommended patient be kept night for further stroke work-up.  Hospitalist service asked to evaluate patient  Review of Systems:  General: Denies fever, chills, weight loss, night sweats.  Denies dizziness.  Denies change in appetite HENT: Denies head trauma, headache, denies change in hearing, tinnitus.  Denies nasal congestion or bleeding.  Denies sore throat.  Denies difficulty swallowing Eyes: Denies blurry vision, pain in  eye, drainage.  Denies discoloration of eyes. Neck: Denies pain.  Denies swelling.  Denies pain with movement. Cardiovascular: Denies chest pain, palpitations.  Denies edema.  Denies orthopnea Respiratory: Denies shortness of breath, cough.  Denies wheezing.  Denies sputum production Gastrointestinal: Denies abdominal pain, swelling.  Denies nausea, vomiting, diarrhea.  Denies melena.  Denies hematemesis. Musculoskeletal: Denies limitation of movement.  Denies deformity or swelling. Denies arthralgias or myalgias. Genitourinary: Denies pelvic pain.  Denies urinary frequency or hesitancy.  Denies dysuria.  Skin: Denies rash.  Denies petechiae, purpura, ecchymosis. Neurological: Denies syncope. Denies seizure activity. Denies paresthesia. Denies visual change. Psychiatric: Denies depression, anxiety. Denies hallucinations.  Past Medical History:  Diagnosis Date   Anemia    Anxiety    situational- surgery   Asthma    as a child   Cancer ([place]) 2019   colon- colectomy    CHF (congestive heart failure) ([place])    Chronic kidney disease    followed by Dr. Delfina Redwood   Coronary artery disease    Diabetes mellitus without complication ([place])    Type II   Dyslipidemia 10/27/2015   Dyspnea    w/ exertion    Elevated PSA 10/27/2015   Erectile dysfunction 10/27/2015   Heart murmur    Hypertension 10/27/2015   Hypogonadism male 10/27/2015   Neuropathy    Obesity 10/27/2015   Peripheral vascular disease ([place])    Pneumonia 10/27/2015   pt states was 1982   Rotator cuff tear 10/27/2015    Past Surgical History:  Procedure Laterality Date   ABDOMINAL AORTOGRAM N/A 02/01/2019   Procedure: ABDOMINAL AORTOGRAM;  Surgeon: Marty Heck, MD;  Location: [facility];  Service: Cardiovascular;  Laterality: N/A;   AMPUTATION Right 02/03/2019   Procedure: RIGHT FOOT 1ST RAY AMPUTATION;  Surgeon: [name], MD;  Location: [facility];  Service: Orthopedics;  Laterality: Right;   AMPUTATION  Right 08/02/2019   Procedure: RIGHT 2ND TOE AMPUTATION;  Surgeon: [name], MD;  Location: [facility];  Service: Orthopedics;  Laterality: Right;   AMPUTATION Right 11/08/2020   Procedure: RIGHT TRANSMETATARSAL AMPUTATION;  Surgeon: [name], MD;  Location: [facility];  Service: Orthopedics;  Laterality: Right;   APPLICATION OF WOUND VAC  11/08/2020   Procedure: APPLICATION OF WOUND VAC;  Surgeon: [name], MD;  Location: [facility];  Service: Orthopedics;;   CARDIAC CATHETERIZATION N/A 11/14/2015   Procedure: Left Heart Cath and Coronary Angiography;  Surgeon: [name], MD;  Location: [facility];  Service: Cardiovascular;  Laterality: N/A;   COLONOSCOPY     LAPAROSCOPIC PARTIAL COLECTOMY  12/15/2017   LAPAROSCOPIC PARTIAL COLECTOMY (N/A Abdomen)   LAPAROSCOPIC PARTIAL COLECTOMY N/A 12/15/2017   Procedure: LAPAROSCOPIC PARTIAL COLECTOMY;  Surgeon: Erroll Luna, MD;  Location: [facility];  Service: General;  Laterality: N/A;   LOWER EXTREMITY ANGIOGRAPHY Right 02/01/2019   Procedure: LOWER EXTREMITY ANGIOGRAPHY;  Surgeon: Marty Heck, MD;  Location: [facility];  Service: Cardiovascular;  Laterality: Right;   PERIPHERAL VASCULAR INTERVENTION Right 02/01/2019   Procedure: PERIPHERAL VASCULAR INTERVENTION;  Surgeon: Marty Heck, MD;  Location: [facility];  Service: Cardiovascular;  Laterality: Right;  SFA   TEE WITHOUT CARDIOVERSION N/A 12/15/2016   Procedure: TRANSESOPHAGEAL ECHOCARDIOGRAM (TEE);  Surgeon: Sherren Mocha, MD;  Location: [facility];  Service: Open Heart Surgery;  Laterality: N/A;   TRANSCATHETER AORTIC VALVE REPLACEMENT, TRANSFEMORAL N/A 12/15/2016   Procedure: TRANSCATHETER AORTIC VALVE REPLACEMENT, TRANSFEMORAL;  Surgeon: Sherren Mocha, MD;  Location: [facility];  Service: Open Heart Surgery;  Laterality: N/A;    Social History  reports that he has quit smoking. His smoking use included cigarettes. He has never used smokeless tobacco. He reports  current alcohol use. He reports that he does not use drugs.  No Known Allergies  Family History  Problem Relation Age of Onset   Diabetes Mother    Heart disease Mother    Pulmonary embolism Father      Prior to Admission medications   Medication Sig Start Date End Date Taking? Authorizing Provider  amLODipine (NORVASC) 10 MG tablet TAKE 1 TABLET BY MOUTH EVERY DAY 11/11/20   [name], MD  aspirin 81 MG EC tablet Take 81 mg by mouth daily. Swallow whole.    [provider]  carvedilol (COREG) 3.125 MG tablet TAKE 1 TABLET (3.125 MG TOTAL) BY MOUTH 2 (TWO) TIMES DAILY. 02/07/21   [name], MD  Cholecalciferol (VITAMIN D) 50 MCG (2000 UT) tablet Take 2,000 Units by mouth daily.    [provider]  clopidogrel (PLAVIX) 75 MG tablet TAKE 1 TABLET BY MOUTH DAILY WITH BREAKFAST. 03/11/21   [name], MD  ferrous sulfate 325 (65 FE) MG tablet Take 325 mg by mouth daily with breakfast.    [provider]  finasteride (PROSCAR) 5 MG tablet Take 5 mg by mouth daily. 05/21/20   [provider]  furosemide (LASIX) 40 MG tablet TAKE 1.5 TABLETS DAILY. 01/01/21   Kathlen Mody,  Scott T, PA-C  JARDIANCE 25 MG TABS tablet Take 25 mg by mouth daily. 12/06/19   [provider]  LOVAZA 1 g capsule TAKE 1 CAPSULE BY MOUTH EVERY DAY 01/27/21   [name], MD  Multiple Vitamin (MULTIVITAMIN WITH MINERALS) TABS tablet Take 1 tablet by mouth daily. 10/27/15   Dana Allan I, MD  naproxen sodium (ALEVE) 220 MG tablet Take 220 mg by mouth daily as needed (pain).    [provider]  nitroGLYCERIN (NITRODUR - DOSED IN MG/24 HR) 0.2 mg/hr patch Place 1 patch (0.2 mg total) onto the skin daily. 11/21/20   [name], MD  pentoxifylline (TRENTAL) 400 MG CR tablet TAKE 1 TABLET BY MOUTH 3 TIMES DAILY WITH MEALS. 02/18/21   Suzan Slick, NP  rosuvastatin (CRESTOR) 20 MG tablet Take 20 mg by mouth daily. 12/06/19   [provider]   sacubitril-valsartan (ENTRESTO) 49-51 MG Take 1 tablet by mouth 2 (two) times daily. 01/13/21   [name], MD  tamsulosin (FLOMAX) 0.4 MG CAPS capsule Take 0.4 mg by mouth daily. 05/21/20   [provider]  VITAMIN B COMPLEX-C PO Take 1 tablet by mouth daily.    [provider]    Physical Exam: Vitals:   03/12/21 2108 03/12/21 2115 03/12/21 2145 03/12/21 2200  BP:  (!) 143/119 133/80 131/75  Pulse:  63 (!) 56 61  Resp: 20 15 16 10   Temp:      TempSrc:      SpO2:  97% 99% 98%  Weight:        Constitutional: NAD, calm, comfortable Vitals:   03/12/21 2108 03/12/21 2115 03/12/21 2145 03/12/21 2200  BP:  (!) 143/119 133/80 131/75  Pulse:  63 (!) 56 61  Resp: 20 15 16 10   Temp:      TempSrc:      SpO2:  97% 99% 98%  Weight:       General: WDWN, Alert and oriented x3.  Eyes: EOMI, PERRL, conjunctivae normal.  Sclera nonicteric. No nystagmus HENT:  [place]/AT, external ears normal.  Nares patent without epistasis.  Mucous membranes are moist. Posterior pharynx clear of any exudate or lesions. Tongue midline with normal extension. Neck: Soft, normal range of motion, supple, no masses, Trachea midline Respiratory: clear to auscultation bilaterally, no wheezing, no crackles. Normal respiratory effort. No accessory muscle use.  Cardiovascular: Regular rate and rhythm, no murmurs / rubs / gallops. No extremity edema. 2+ pedal pulses. No carotid bruits.  Abdomen: Soft, no tenderness, nondistended, no rebound or guarding.  No masses palpated. Bowel sounds normoactive Musculoskeletal: FROM. no cyanosis. No joint deformity upper and lower extremities. Normal muscle tone.  Skin: Warm, dry, intact no rashes, lesions, ulcers. No induration Neurologic: CN 2-12 grossly intact.  Normal speech.  Sensation intact, patella DTR +2 bilaterally. Strength 5/5 in all extremities. No pronator drift. No deviation of tongue.  Psychiatric: Normal judgment and insight.  Normal mood.    Labs  on Admission: I have personally reviewed following labs and imaging studies  CBC: Recent Labs  Lab 03/12/21 2044 03/12/21 2050  WBC 5.7  --   NEUTROABS 3.0  --   HGB 13.4 14.6  HCT 41.7 43.0  MCV 92.3  --   PLT PLATELET CLUMPS NOTED ON SMEAR, UNABLE TO ESTIMATE  --     Basic Metabolic Panel: Recent Labs  Lab 03/12/21 2044 03/12/21 2050  NA 136 142  K 3.9 4.0  CL 108 108  CO2  23  --   GLUCOSE 157* 150*  BUN 27* 31*  CREATININE 1.89* 1.80*  CALCIUM 9.0  --     GFR: Estimated Creatinine Clearance: 37.3 mL/min (A) (by C-G formula based on SCr of 1.8 mg/dL (H)).  Liver Function Tests: Recent Labs  Lab 03/12/21 2044  AST 31  ALT 19  ALKPHOS 48  BILITOT 0.5  PROT 7.1  ALBUMIN 3.7    Urine analysis:    Component Value Date/Time   COLORURINE YELLOW 02/02/2019 0802   APPEARANCEUR CLEAR 02/02/2019 0802   LABSPEC 1.015 02/02/2019 0802   PHURINE 5.0 02/02/2019 0802   GLUCOSEU NEGATIVE 02/02/2019 0802   HGBUR NEGATIVE 02/02/2019 0802   BILIRUBINUR NEGATIVE 02/02/2019 0802   KETONESUR NEGATIVE 02/02/2019 0802   PROTEINUR NEGATIVE 02/02/2019 0802   NITRITE NEGATIVE 02/02/2019 0802   LEUKOCYTESUR NEGATIVE 02/02/2019 0802    Radiological Exams on Admission: CT HEAD CODE STROKE WO CONTRAST  Result Date: 03/12/2021 CLINICAL DATA:  Code stroke. EXAM: CT HEAD WITHOUT CONTRAST TECHNIQUE: Contiguous axial images were obtained from the base of the skull through the vertex without intravenous contrast. COMPARISON:  None. FINDINGS: Brain: No evidence of acute infarction, hemorrhage, cerebral edema, mass, mass effect, or midline shift. Ventricles and sulci are normal for age. No extra-axial fluid collection. Periventricular white matter changes, likely the sequela of chronic small vessel ischemic disease. Generalized atrophy, without lobar predominance. Vascular: No hyperdense vessel or unexpected calcification. Skull: Normal. Negative for fracture or focal lesion.  Sinuses/Orbits: No acute finding. Other: The mastoid air cells are well aerated. ASPECTS [facility] Stroke Program Early CT Score) - Ganglionic level infarction (caudate, lentiform nuclei, internal capsule, insula, M1-M3 cortex): 7 - Supraganglionic infarction (M4-M6 cortex): 3 Total score (0-10 with 10 being normal): 10 IMPRESSION: 1. No acute intracranial process. 2. ASPECTS is 10 Code stroke imaging results were communicated on 03/12/2021 at 9:04 pm to provider [place] via secure text paging. Electronically Signed   By: Merilyn Baba M.D.   On: 03/12/2021 21:04    EKG: Independently reviewed.  EKG shows normal sinus rhythm with no acute ST elevation or depression.  QTc 474  Assessment/Plan Principal Problem:   TIA (transient ischemic attack) Patient be observed on medical telemetry floor for TIA.  Will obtain MRI of brain to rule out acute ischemic CVA.  Obtain MRA head to rule out LVO. Will evaluate carotids for large vessel occlusion/stenosis. Obtain echocardiogram to evaluate for PFO, wall motion and ejection fraction. Hypertension of 220/110 will be allowed for 24 hours per stroke protocol.  After which blood pressure will be slowly reduced to goal level. Antiplatelet therapy with aspirin daily. Continue statin therapy.  Check lipid panel.  Neurochecks per stroke protocol  Active Problems:   CKD (chronic kidney disease), stage III  Stable.     Essential hypertension Continue Coreg, Entresto. Monitor BP.     Diabetes mellitus type 2, controlled  Continue Jardiance. Check HgbA1c    Chronic combined systolic and diastolic heart failure Coreg, Entresto, Lasix.  Echocardiogram ordered Continue Jardiance. Check lipid panel    S/P TAVR (transcatheter aortic valve replacement) Continue aspirin and Plavix   DVT prophylaxis: SCDs for DVT prophylaxis while undergoing CVA workup  Code Status:   Full Code  Family Communication:  Diagnosis and plan discussed with patient and his wife who is  at bedside.  They verbalized understanding agree with plan.  Questions answered.  Further recommendations to follow as clinical indicated Disposition Plan:   Patient is from:  Home  Anticipated DC to:  Home  Anticipated DC date:  Anticipate less than 2 midnight stay   Consults called:  Neurology  Admission status:  Observation   Yevonne Aline Herny Scurlock MD [facility]  How to contact the [facility] Attending or Consulting provider [place] or covering provider during after hours [place], for this patient?   Check the care team in [facility] and look for a) attending/consulting [facility] provider listed and b) the [facility] team listed Log into [email or web address] and use [place]'s universal password to access. If you do not have the password, please contact the hospital operator. Locate the [facility] provider you are looking for under [facility] and page to a number that you can be directly reached. If you still have difficulty reaching the provider, please page the [facility] (Director on Call) for the Hospitalists listed on amion for assistance.  03/12/2021, 10:54 PM

## 2021-03-12 NOTE — ED Notes (Signed)
Patient transported to MRI 

## 2021-03-13 ENCOUNTER — Observation Stay (HOSPITAL_BASED_OUTPATIENT_CLINIC_OR_DEPARTMENT_OTHER): Payer: Medicare HMO

## 2021-03-13 ENCOUNTER — Other Ambulatory Visit (HOSPITAL_COMMUNITY): Payer: Self-pay

## 2021-03-13 DIAGNOSIS — G459 Transient cerebral ischemic attack, unspecified: Secondary | ICD-10-CM | POA: Diagnosis not present

## 2021-03-13 DIAGNOSIS — Z952 Presence of prosthetic heart valve: Secondary | ICD-10-CM | POA: Diagnosis not present

## 2021-03-13 DIAGNOSIS — I10 Essential (primary) hypertension: Secondary | ICD-10-CM

## 2021-03-13 DIAGNOSIS — I5042 Chronic combined systolic (congestive) and diastolic (congestive) heart failure: Secondary | ICD-10-CM

## 2021-03-13 DIAGNOSIS — I679 Cerebrovascular disease, unspecified: Secondary | ICD-10-CM | POA: Diagnosis not present

## 2021-03-13 LAB — ECHOCARDIOGRAM COMPLETE
AR max vel: 0.55 cm2
AV Area VTI: 0.52 cm2
AV Area mean vel: 0.54 cm2
AV Mean grad: 24.5 mmHg
AV Peak grad: 42.5 mmHg
Ao pk vel: 3.26 m/s
Area-P 1/2: 3.02 cm2
Calc EF: 47.3 %
S' Lateral: 4.3 cm
Single Plane A2C EF: 52.7 %
Single Plane A4C EF: 41 %
Weight: 3640.24 oz

## 2021-03-13 LAB — LIPID PANEL
Cholesterol: 180 mg/dL (ref 0–200)
HDL: 61 mg/dL (ref >40)
LDL Cholesterol: 103 mg/dL — ABNORMAL HIGH (ref 0–99)
Total CHOL/HDL Ratio: 3 RATIO
Triglycerides: 78 mg/dL (ref <150)
VLDL: 16 mg/dL (ref 0–40)

## 2021-03-13 LAB — HEMOGLOBIN A1C
Hgb A1c MFr Bld: 6.9 % — ABNORMAL HIGH (ref 4.8–5.6)
Mean Plasma Glucose: 151.33 mg/dL

## 2021-03-13 LAB — RESP PANEL BY RT-PCR (FLU A&B, COVID) ARPGX2
Influenza A by PCR: NEGATIVE
Influenza B by PCR: NEGATIVE
SARS Coronavirus 2 by RT PCR: NEGATIVE

## 2021-03-13 MED ORDER — ROSUVASTATIN CALCIUM 20 MG PO TABS
40.0000 mg | ORAL_TABLET | Freq: Every day | ORAL | Status: DC
  Administered 2021-03-13: 40 mg via ORAL
  Filled 2021-03-13: qty 2

## 2021-03-13 MED ORDER — CLOPIDOGREL BISULFATE 75 MG PO TABS: 75.0000 mg | ORAL_TABLET | Freq: Every day | ORAL | Status: DC

## 2021-03-13 MED ORDER — CLOPIDOGREL BISULFATE 300 MG PO TABS
300.0000 mg | ORAL_TABLET | Freq: Once | ORAL | Status: AC
  Administered 2021-03-13: 300 mg via ORAL
  Filled 2021-03-13: qty 1

## 2021-03-13 MED ORDER — ASPIRIN 81 MG PO CHEW: 81.0000 mg | CHEWABLE_TABLET | Freq: Every day | ORAL | Status: DC

## 2021-03-13 MED ORDER — AMLODIPINE BESYLATE 5 MG PO TABS: 10.0000 mg | ORAL_TABLET | Freq: Every day | ORAL | Status: DC

## 2021-03-13 MED ORDER — TICAGRELOR 90 MG PO TABS: 90.0000 mg | ORAL_TABLET | Freq: Two times a day (BID) | ORAL | Status: DC

## 2021-03-13 MED ORDER — TICAGRELOR 90 MG PO TABS: 90.0000 mg | ORAL_TABLET | Freq: Two times a day (BID) | ORAL | 0 refills | Status: DC

## 2021-03-13 MED ORDER — ASPIRIN EC 325 MG PO TBEC
325.0000 mg | DELAYED_RELEASE_TABLET | Freq: Every day | ORAL | Status: DC
  Administered 2021-03-13: 325 mg via ORAL
  Filled 2021-03-13: qty 1

## 2021-03-13 MED ORDER — TICAGRELOR 90 MG PO TABS
90.0000 mg | ORAL_TABLET | Freq: Two times a day (BID) | ORAL | 0 refills | Status: DC
  Filled 2021-03-13: qty 60, 30d supply, fill #0

## 2021-03-13 NOTE — Progress Notes (Signed)
°  Echocardiogram 2D Echocardiogram has been performed.  Elmer Ramp 03/13/2021, 12:37 PM

## 2021-03-13 NOTE — Evaluation (Signed)
Physical Therapy Evaluation Patient Details Name: Robert Horn MRN: [id number] DOB: 21-Feb-1934 Today's Date: 03/13/2021  History of Present Illness  Pt presented to ED 12/14 with slurred speech and facial droop. MRI and CT negative. PMH includes: rt transmet amputation. colon cancer, CHF, CKD, CAD, DM II, HTN, PVD, neuropathy.  Clinical Impression  Pt doing well with mobility and no further PT needed.  Ready for dc from PT standpoint.        Recommendations for follow up therapy are one component of a multi-disciplinary discharge planning process, led by the attending physician.  Recommendations may be updated based on patient status, additional functional criteria and insurance authorization.  Follow Up Recommendations No PT follow up    Assistance Recommended at Discharge None  Functional Status Assessment Patient has not had a recent decline in their functional status  Equipment Recommendations  None recommended by PT    Recommendations for Other Services       Precautions / Restrictions Precautions Precautions: None      Mobility  Bed Mobility Overal bed mobility: Modified Independent             General bed mobility comments: HOB elevated    Transfers Overall transfer level: Independent Equipment used: None                    Ambulation/Gait Ambulation/Gait assistance: Independent Gait Distance (Feet): 250 Feet Assistive device: None Gait Pattern/deviations: WFL(Within Functional Limits) Gait velocity: normal Gait velocity interpretation: >4.37 ft/sec, indicative of normal walking speed   General Gait Details: Steady gait  Stairs            Wheelchair Mobility    Modified Rankin (Stroke Patients Only) Modified Rankin (Stroke Patients Only) Pre-Morbid Rankin Score: No symptoms Modified Rankin: No symptoms     Balance Overall balance assessment: Independent (able to turn, stop, and pick up object from floor without difficulty)                                            Pertinent Vitals/Pain Pain Assessment: No/denies pain    Home Living Family/patient expects to be discharged to:: Private residence Living Arrangements: Spouse/significant other Available Help at Discharge: Available 24 hours/day;Family Type of Home: House Home Access: Stairs to enter Entrance Stairs-Rails: None Entrance Stairs-Number of Steps: front: 3 large steps (deep); back: 1, 2, and 2 steps to deck   Home Layout: Multi-level;Able to live on main level with bedroom/bathroom Home Equipment: BSC/3in1;Shower seat;Cane - single point;Rolling Walker (2 wheels);Electric scooter      Prior Function Prior Level of Function : Independent/Modified Independent;Working/employed;Driving             Mobility Comments: No assistive device       Hand Dominance   Dominant Hand: Right    Extremity/Trunk Assessment   Upper Extremity Assessment Upper Extremity Assessment: Defer to OT evaluation    Lower Extremity Assessment Lower Extremity Assessment: Overall WFL for tasks assessed    Cervical / Trunk Assessment Cervical / Trunk Assessment: Normal  Communication   Communication: HOH  Cognition Arousal/Alertness: Awake/alert Behavior During Therapy: WFL for tasks assessed/performed Overall Cognitive Status: Within Functional Limits for tasks assessed  General Comments General comments (skin integrity, edema, etc.): VSS. Not orthostatic. See vitals flow sheet.    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 PT Goal Formulation: All assessment and education complete, DC therapy     Frequency     Barriers to discharge        Co-evaluation               AM-PAC PT "6 Clicks" Mobility  Outcome Measure Help needed turning from your back to your side while in a flat bed without using bedrails?: None Help needed moving from lying on your back to sitting on the side of a flat bed without using bedrails?: None Help needed moving to and from a bed to a chair (including a wheelchair)?: None Help needed standing up from a chair using your arms (e.g., wheelchair or bedside chair)?: None Help needed to walk in hospital room?: None Help needed climbing 3-5 steps with a railing? : None 6 Click Score: 24    End of Session   Activity Tolerance: Patient tolerated treatment well Patient left: in chair;with call bell/phone within reach Nurse Communication: Mobility status PT Visit Diagnosis: Other symptoms and signs involving the nervous system (R29.898)    Time: 8937-3428 PT Time Calculation (min) (ACUTE ONLY): 14 min   Charges:   PT Evaluation $PT Eval Low Complexity: 1 Low          [place] Pager [phone] Office [place] 03/13/2021, 9:33 AM

## 2021-03-13 NOTE — ED Notes (Signed)
Pt assisted up to [facility] to have BM and then back to stretcher. Secured with side rails up. No acute changes noted. Pt denies any complaints. Will continue to monitor.

## 2021-03-13 NOTE — Evaluation (Signed)
Occupational Therapy Evaluation and Discharge Patient Details Name: Robert Horn MRN: [id number] DOB: [birth year] Today's Date: 03/13/2021   History of Present Illness Pt presented to ED 12/14 with slurred speech and facial droop. MRI and CT negative. PMH includes: rt transmet amputation. colon cancer, CHF, CKD, CAD, DM II, HTN, PVD, neuropathy.   Clinical Impression   Pt is functioning independently with stable VS throughout session. No OT needs.      Recommendations for follow up therapy are one component of a multi-disciplinary discharge planning process, led by the attending physician.  Recommendations may be updated based on patient status, additional functional criteria and insurance authorization.   Follow Up Recommendations  No OT follow up    Assistance Recommended at Discharge None  Functional Status Assessment     Equipment Recommendations  None recommended by OT    Recommendations for Other Services       Precautions / Restrictions Precautions Precautions: None      Mobility Bed Mobility Overal bed mobility: Modified Independent             General bed mobility comments: HOB elevated    Transfers Overall transfer level: Independent Equipment used: None                      Balance Overall balance assessment: Independent (able to turn, stop, and pick up object from floor without difficulty)                                         ADL either performed or assessed with clinical judgement   ADL Overall ADL's : Independent                                             Vision Ability to See in Adequate Light: 0 Adequate Patient Visual Report: No change from baseline       Perception     Praxis      Pertinent Vitals/Pain Pain Assessment: No/denies pain     Hand Dominance Right   Extremity/Trunk Assessment Upper Extremity Assessment Upper Extremity Assessment: Overall WFL for tasks assessed    Lower Extremity Assessment Lower Extremity Assessment: Overall WFL for tasks assessed   Cervical / Trunk Assessment Cervical / Trunk Assessment: Normal   Communication Communication Communication: HOH   Cognition Arousal/Alertness: Awake/alert Behavior During Therapy: WFL for tasks assessed/performed Overall Cognitive Status: Within Functional Limits for tasks assessed                                       General Comments  VSS. Not orthostatic. See vitals flow sheet.    Exercises     Shoulder Instructions      Home Living Family/patient expects to be discharged to:: Private residence Living Arrangements: Spouse/significant other Available Help at Discharge: Available 24 hours/day;Family Type of Home: House Home Access: Stairs to enter [employer] of Steps: front: 3 large steps (deep); back: 1, 2, and 2 steps to deck Entrance Stairs-Rails: None Home Layout: Multi-level;Able to live on main level with bedroom/bathroom     Bathroom Shower/Tub: Occupational psychologist: Standard     Home Equipment: BSC/3in1;Shower seat;Cane - single  point;Rolling Walker (2 wheels);Electric scooter          Prior Functioning/Environment Prior Level of Function : Independent/Modified Independent;Working/employed;Driving             Mobility Comments: No assistive device ADLs Comments: drives, works as an Geophysicist/field seismologist Problem List:        OT Treatment/Interventions:      OT Goals(Current goals can be found in the care plan section)    OT Frequency:     Barriers to D/C:            Co-evaluation              AM-PAC OT "6 Clicks" Daily Activity     Outcome Measure Help from another person eating meals?: None Help from another person taking care of personal grooming?: None Help from another person toileting, which includes using toliet, bedpan, or urinal?: None Help from another person bathing (including washing,  rinsing, drying)?: None Help from another person to put on and taking off regular upper body clothing?: None Help from another person to put on and taking off regular lower body clothing?: None 6 Click Score: 24   End of Session Nurse Communication: Mobility status  Activity Tolerance: Patient tolerated treatment well Patient left: in chair;with call bell/phone within reach  OT Visit Diagnosis: Muscle weakness (generalized) (M62.81)                Time: 0354-6568 OT Time Calculation (min): 15 min Charges:  OT General Charges $OT Visit: 1 Visit OT Evaluation $OT Eval Low Complexity: 1 Low  Robert Horn, OTR/L Acute Rehabilitation Services Pager: [phone] Office: [phone]   Robert Horn 03/13/2021, 11:05 AM

## 2021-03-13 NOTE — Progress Notes (Addendum)
STROKE TEAM PROGRESS NOTE   INTERVAL HISTORY Patient is seen in his room with his wife and daughter at the bedside.  Yesterday when he was eating dinner, he noticed some food drip from the right side of his mouth (his wife reports that he had a facial droop on the left side) with difficulty bringing his spoon to his mouth.  Some slurring of speech was also noted.  His wife called 75 and he was brought to the ED as a code stroke.  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  Symptoms resolved spontaneously upon arrival to the ED, and CT and MRI revealed no acute abnormalities.  Vitals:   03/13/21 0915 03/13/21 1100 03/13/21 1145 03/13/21 1215  BP: 137/88 (!) 147/86 (!) 171/80 130/68  Pulse: 60 (!) 53 (!) 54 (!) 48  Resp: 16 18 14 15   Temp:      TempSrc:      SpO2: 93% 99% 97% 98%  Weight:       CBC:  Recent Labs  Lab 03/12/21 2044 03/12/21 2050  WBC 5.7  --   NEUTROABS 3.0  --   HGB 13.4 14.6  HCT 41.7 43.0  MCV 92.3  --   PLT PLATELET CLUMPS NOTED ON SMEAR, UNABLE TO ESTIMATE  --    Basic Metabolic Panel:  Recent Labs  Lab 03/12/21 2044 03/12/21 2050  NA 136 142  K 3.9 4.0  CL 108 108  CO2 23  --   GLUCOSE 157* 150*  BUN 27* 31*  CREATININE 1.89* 1.80*  CALCIUM 9.0  --    Lipid Panel:  Recent Labs  Lab 03/13/21 0319  CHOL 180  TRIG 78  HDL 61  CHOLHDL 3.0  VLDL 16  LDLCALC 103*   HgbA1c:  Recent Labs  Lab 03/13/21 0319  HGBA1C 6.9*   Urine Drug Screen: No results for input(s): LABOPIA, COCAINSCRNUR, LABBENZ, AMPHETMU, THCU, LABBARB in the last 168 hours.  Alcohol Level No results for input(s): ETH in the last 168 hours.  IMAGING past 24 hours MR ANGIO HEAD WO CONTRAST  Result Date: 03/13/2021 CLINICAL DATA:  Transient ischemic attack EXAM: MRI HEAD WITHOUT CONTRAST MRA HEAD WITHOUT CONTRAST TECHNIQUE: Multiplanar, multi-echo pulse sequences of the brain and surrounding structures  were acquired without intravenous contrast. Angiographic images of the Circle of Willis were acquired using MRA technique without intravenous contrast. COMPARISON:  No prior MRI, correlation is made with CT head 03/12/2021 FINDINGS: MRI HEAD FINDINGS Brain: No restricted diffusion to suggest acute or subacute infarction. No acute hemorrhage, mass, mass effect, or midline shift. No hydrocephalus or extra-axial collection. Confluent T2 hyperintense signal in the periventricular white matter, likely the sequela of severe chronic small vessel ischemic disease. No foci of hemosiderin deposition to suggest remote hemorrhage. Generalized cerebral atrophy, without lobar predominance, likely within normal limits for age. Vascular: Normal flow voids.  Please see MRA findings below. Skull and upper cervical spine: Normal marrow signal. Degenerative changes in the cervical spine, with at least mild spinal canal stenosis at C3-C4. Sinuses/Orbits: No acute or significant finding. Other: The mastoids are well aerated. MRA HEAD FINDINGS Anterior circulation: Both internal carotid arteries are patent to the termini, without stenosis or other abnormality. A1 segments patent. Normal anterior communicating artery. Anterior cerebral arteries are patent to their distal aspects. No M1 stenosis or occlusion. At the right MCA bifurcation, there is focal non opacification of a  segment of right M2 (series 5, images 69-77, which is likely chronic as there are multiple collateral vessels in the area. Focal narrowing of a left M2 branch proximally, just distal to the bifurcation (series 5, image 62); this is otherwise patent. Distal MCA branches perfused and symmetric. Posterior circulation: Vertebral arteries patent to the vertebrobasilar junction without stenosis. Posterior inferior cerebral arteries patent bilaterally. Basilar patent to its distal aspect. Superior cerebellar arteries patent bilaterally. PCAs perfused to their distal aspects  without stenosis. Near fetal origin of the left PCA, with a dominant left posterior communicating artery and a diminutive left P1. Normal right P1. The right posterior communicating artery is not visualized. Anatomic variants: None significant IMPRESSION: 1. No acute intracranial process. 2. Focal non opacification of a proximal M2 branch, with distal reconstitution. This may be chronic, as there are multiple small vessels in this area. 3. Focal stenosis at the origin of a left M2 branch, which is otherwise patent. Electronically Signed   By: Merilyn Baba M.D.   On: 03/13/2021 01:19   MR BRAIN WO CONTRAST  Result Date: 03/13/2021 CLINICAL DATA:  Transient ischemic attack EXAM: MRI HEAD WITHOUT CONTRAST MRA HEAD WITHOUT CONTRAST TECHNIQUE: Multiplanar, multi-echo pulse sequences of the brain and surrounding structures were acquired without intravenous contrast. Angiographic images of the Circle of Willis were acquired using MRA technique without intravenous contrast. COMPARISON:  No prior MRI, correlation is made with CT head 03/12/2021 FINDINGS: MRI HEAD FINDINGS Brain: No restricted diffusion to suggest acute or subacute infarction. No acute hemorrhage, mass, mass effect, or midline shift. No hydrocephalus or extra-axial collection. Confluent T2 hyperintense signal in the periventricular white matter, likely the sequela of severe chronic small vessel ischemic disease. No foci of hemosiderin deposition to suggest remote hemorrhage. Generalized cerebral atrophy, without lobar predominance, likely within normal limits for age. Vascular: Normal flow voids.  Please see MRA findings below. Skull and upper cervical spine: Normal marrow signal. Degenerative changes in the cervical spine, with at least mild spinal canal stenosis at C3-C4. Sinuses/Orbits: No acute or significant finding. Other: The mastoids are well aerated. MRA HEAD FINDINGS Anterior circulation: Both internal carotid arteries are patent to the  termini, without stenosis or other abnormality. A1 segments patent. Normal anterior communicating artery. Anterior cerebral arteries are patent to their distal aspects. No M1 stenosis or occlusion. At the right MCA bifurcation, there is focal non opacification of a segment of right M2 (series 5, images 69-77, which is likely chronic as there are multiple collateral vessels in the area. Focal narrowing of a left M2 branch proximally, just distal to the bifurcation (series 5, image 62); this is otherwise patent. Distal MCA branches perfused and symmetric. Posterior circulation: Vertebral arteries patent to the vertebrobasilar junction without stenosis. Posterior inferior cerebral arteries patent bilaterally. Basilar patent to its distal aspect. Superior cerebellar arteries patent bilaterally. PCAs perfused to their distal aspects without stenosis. Near fetal origin of the left PCA, with a dominant left posterior communicating artery and a diminutive left P1. Normal right P1. The right posterior communicating artery is not visualized. Anatomic variants: None significant IMPRESSION: 1. No acute intracranial process. 2. Focal non opacification of a proximal M2 branch, with distal reconstitution. This may be chronic, as there are multiple small vessels in this area. 3. Focal stenosis at the origin of a left M2 branch, which is otherwise patent. Electronically Signed   By: Merilyn Baba M.D.   On: 03/13/2021 01:19   CT HEAD CODE STROKE WO CONTRAST  Result Date: 03/12/2021 CLINICAL DATA:  Code stroke. EXAM: CT HEAD WITHOUT CONTRAST TECHNIQUE: Contiguous axial images were obtained from the base of the skull through the vertex without intravenous contrast. COMPARISON:  None. FINDINGS: Brain: No evidence of acute infarction, hemorrhage, cerebral edema, mass, mass effect, or midline shift. Ventricles and sulci are normal for age. No extra-axial fluid collection. Periventricular white matter changes, likely the sequela of  chronic small vessel ischemic disease. Generalized atrophy, without lobar predominance. Vascular: No hyperdense vessel or unexpected calcification. Skull: Normal. Negative for fracture or focal lesion. Sinuses/Orbits: No acute finding. Other: The mastoid air cells are well aerated. ASPECTS [facility] Stroke Program Early CT Score) - Ganglionic level infarction (caudate, lentiform nuclei, internal capsule, insula, M1-M3 cortex): 7 - Supraganglionic infarction (M4-M6 cortex): 3 Total score (0-10 with 10 being normal): 10 IMPRESSION: 1. No acute intracranial process. 2. ASPECTS is 10 Code stroke imaging results were communicated on 03/12/2021 at 9:04 pm to provider [place] via secure text paging. Electronically Signed   By: Merilyn Baba M.D.   On: 03/12/2021 21:04   VAS US CAROTID (at [facility] and [facility] only)  Result Date: 03/13/2021 Carotid Arterial Duplex Study Patient Name:  LYNK MARTI  Date of Exam:   03/13/2021 Medical Rec #: [id number]      Accession #:    [id number] Date of Birth: 1933/11/11      Patient Gender: M Patient Age:   43 years Exam Location:  [facility] Procedure:      VAS US CAROTID Referring Phys: Harrold Donath --------------------------------------------------------------------------------  Indications:       TIA. Risk Factors:      Hypertension, Diabetes, past history of smoking, coronary                    artery disease, PAD. Other Factors:     S/P TAVR. Comparison Study:  11-13-2016 Carotid duplex showed 1-39% ICA stenosis                    bilaterally. Performing Technologist: Darlin Coco RDMS, RVT  Examination Guidelines: A complete evaluation includes B-mode imaging, spectral Doppler, color Doppler, and power Doppler as needed of all accessible portions of each vessel. Bilateral testing is considered an integral part of a complete examination. Limited examinations for reoccurring indications may be performed as noted.  Right Carotid Findings:  +----------+--------+-------+--------+----------------------+------------------+             PSV cm/s EDV     Stenosis Plaque Description     Comments                                 cm/s                                                        +----------+--------+-------+--------+----------------------+------------------+  CCA Prox   72       13                                      intimal thickening  +----------+--------+-------+--------+----------------------+------------------+  CCA Distal 65       11  intimal thickening  +----------+--------+-------+--------+----------------------+------------------+  ICA Prox   90       21      1-39%    smooth and                                                                       heterogenous                               +----------+--------+-------+--------+----------------------+------------------+  ICA Distal 71       21                                                          +----------+--------+-------+--------+----------------------+------------------+  ECA        72       9                                                           +----------+--------+-------+--------+----------------------+------------------+ +----------+--------+-------+----------------+-------------------+             PSV cm/s EDV cms Describe         Arm Pressure (mmHG)  +----------+--------+-------+----------------+-------------------+  Subclavian 70               Multiphasic, WNL                      +----------+--------+-------+----------------+-------------------+ +---------+--------+--+--------+-+---------+  Vertebral PSV cm/s 38 EDV cm/s 8 Antegrade  +---------+--------+--+--------+-+---------+  Left Carotid Findings: +----------+--------+-------+--------+----------------------+------------------+             PSV cm/s EDV     Stenosis Plaque Description     Comments                                 cm/s                                                         +----------+--------+-------+--------+----------------------+------------------+  CCA Prox   70       16                                      intimal thickening  +----------+--------+-------+--------+----------------------+------------------+  CCA Distal 61       13                                      intimal thickening  +----------+--------+-------+--------+----------------------+------------------+  ICA Prox   64       14  1-39%    smooth and                                                                       heterogenous                               +----------+--------+-------+--------+----------------------+------------------+  ICA Distal 66       14                                                          +----------+--------+-------+--------+----------------------+------------------+  ECA        81       10                                                          +----------+--------+-------+--------+----------------------+------------------+ +----------+--------+--------+----------------+-------------------+             PSV cm/s EDV cm/s Describe         Arm Pressure (mmHG)  +----------+--------+--------+----------------+-------------------+  Subclavian 115               Multiphasic, WNL                      +----------+--------+--------+----------------+-------------------+ +---------+--------+--+--------+--+---------+  Vertebral PSV cm/s 69 EDV cm/s 18 Antegrade  +---------+--------+--+--------+--+---------+   Summary: Right Carotid: Velocities in the right ICA are consistent with a 1-39% stenosis. Left Carotid: Velocities in the left ICA are consistent with a 1-39% stenosis. Vertebrals:  Bilateral vertebral arteries demonstrate antegrade flow. Subclavians: Normal flow hemodynamics were seen in bilateral subclavian              arteries. *See table(s) above for measurements and observations.     Preliminary     PHYSICAL EXAM General: Alert, well-developed male patient in no acute  distress   NEURO:  Mental Status: AA&Ox3  Speech/Language: speech is without dysarthria or aphasia.  Naming, repetition, fluency, and comprehension intact.  Cranial Nerves:  II: PERRL.  III, IV, VI: EOMI. Eyelids elevate symmetrically.  V: Sensation is intact to light touch and symmetrical to face.  VII: Smile is symmetrical.  VIII: hearing intact to voice. IX, X: Phonation is normal.  XII: tongue is midline without fasciculations. Motor: 5/5 strength to all muscle groups tested.  Sensation- Intact to light touch bilaterally. Extinction absent to light touch to DSS.  Coordination: FTN intact bilaterally, HKS: no ataxia in BLE.No drift.  Gait- deferred  ASSESSMENT/PLAN Mr. Refugio Vandevoorde is a 87 y.o. male with history of DM2, CHF, HTN, CAD, cardiac valve replacement, colon cancer and transmetatarsal amputation of right foot  presenting with transient slurred speech and left sided facial droop. Yesterday when he was eating dinner, he noticed some food drip from the right side of his mouth (his wife reports that he had a facial droop on the left  side) with difficulty bringing his spoon to his mouth.  Some slurring of speech was also noted.  His wife called 48 and he was brought to the ED as a code stroke.  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  Symptoms resolved spontaneously upon arrival to the ED, and CT and MRI revealed no acute abnormalities.  TIA due to right M2 high-grade stenosis versus short segment occlusion Code Stroke CT head No acute abnormality.  ASPECTS 10.    MRI  No acute intracranial process MRA  focal non opacification of right M2 branch and focal stenosis of left M2 Carotid Doppler  1-39% stenosis in bilateral carotids 2D Echo EF 45 to 50% LDL 103 HgbA1c 6.9 VTE prophylaxis - SCDs aspirin 81 mg daily and clopidogrel 75 mg daily prior to admission, now on aspirin 81 mg and Brillinta 90 twice  daily for 30 days and then back to aspirin and Plavix.   Orthostatic blood pressures WNL Therapy recommendations:  no PT/OT follow up Disposition:  to home  Hypertension Home meds:  Coreg 3.125 mg BID Stable Keep SBP <180 Long-term BP goal normotensive  Hyperlipidemia Home meds:  rosuvastatin 20 mg daily, resumed in hospital LDL 103, goal < 70 High intensity statin deferred due to advanced age Continue statin at discharge  Diabetes type II Controlled Home meds:  Jardiance 25 mg daily HgbA1c 6.9, goal < 7.0 CBGs SSI  CHF Taking Entresto and furosemide at home Concern for hypotension with prolonged standing, will advise patient to limit this Orthostatic vitals WNL  Other Stroke Risk Factors Advanced Age >/= 73  Former cigarette smoker Obesity, Body mass index is 30.86 kg/m., BMI >/= 30 associated with increased stroke risk, recommend weight loss, diet and exercise as appropriate  CAD PAD  Other Active Problems History of colon cancer- follow up with outpatient oncologist  Hospital day # [place] , MSN, AGACNP-BC [facility] See Amion for schedule and pager information 03/13/2021 12:46 PM   ATTENDING NOTE: I reviewed above note and agree with the assessment and plan. Pt was seen and examined.   87 year old male with history of colon cancer status post surgery, CHF, CKD, CAD, PVD, diabetes, hypertension, hyperlipidemia admitted for episode of left facial droop, left-sided weakness and slurred speech.  Symptoms resolved in ED.  CT, MRI no acute finding.  MRI showed right M2 short segment occlusion versus high-grade stenosis, left M2 mild stenosis.  EF 45 to 50%, carotid Doppler negative.  Creatinine 1.80.  A1c 6.9, LDL 103.  On exam, patient neurology intact.  No focal deficit.  Etiology for patient TIA likely due to right M2 high-grade stenosis versus short segment occlusion.  Educated patient on avoidance of low BP.  So far orthostatic vital  stable.  Recommend aspirin 81 and Brilinta 90 twice daily DAPT for 3 days and then go back to aspirin and Plavix.  Increase Crestor from 20-40.  Discussed with Dr. [name] cardiology and primary team Dr. Pietro Cassis.  For detailed assessment and plan, please refer to above as I have made changes wherever appropriate.   Neurology will sign off. Please call with questions. Pt will follow up with stroke clinic NP at [facility] in about 4 weeks. Thanks for the consult.   Rosalin Hawking, MD PhD Stroke Neurology 03/13/2021 6:58 PM    To contact Stroke Continuity provider, please refer to [email or web address] After hours, contact  General Neurology

## 2021-03-13 NOTE — Progress Notes (Signed)
Carotid duplex bilateral study completed.   Please see CV Proc for preliminary results.   Mortimer Bair, RDMS, RVT  

## 2021-03-13 NOTE — ED Notes (Signed)
Pt verbalized understanding of d/c instructions, meds and followup care. Pt given TOC prescription of brilenta. Denies questions. VSS, no distress noted. Steady gait to exit with all belongings.

## 2021-03-13 NOTE — ED Notes (Signed)
Pt given sandwich bag and ginger ale.  

## 2021-03-13 NOTE — Progress Notes (Signed)
SLP Cancellation Note  Patient Details Name: Robert Horn MRN: [id number] DOB: 1933/04/20   Cancelled treatment:       Reason Eval/Treat Not Completed: SLP screened, no needs identified, will sign off   Faye Sanfilippo, Katherene Ponto 03/13/2021, 7:44 AM

## 2021-03-13 NOTE — Discharge Summary (Signed)
Physician Discharge Summary  Robert Horn HER:[id number] DOB: [birth year] DOA: 03/12/2021  PCP: [name], MD  Admit date: 03/12/2021 Discharge date: 03/13/2021  Admitted From: Home Discharge disposition: Home   Code Status: Prior   Discharge Diagnosis:   Principal Problem:   TIA (transient ischemic attack) Active Problems:   Essential hypertension   Chronic combined systolic and diastolic heart failure (HCC)   S/P TAVR (transcatheter aortic valve replacement)   CKD (chronic kidney disease), stage III ([place])   Diabetes mellitus type 2, controlled [name])     Chief Complaint  Patient presents with   Code Stroke    Brief narrative: Robert Horn is a 87 y.o. 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 Patient presented to the ED on 12/14 with sudden onset of facial drooping and slurred speech.  Patient reports that he had been sleeping less and working long hours at his job as an Forensic psychologist. In the evening of 12/14, patient was noted to have mild left facial droop, slurred speech and difficulty getting spoon to her mouth.  He immediately called 911 and by the time he arrived to the ED, his symptoms had resolved. In the ED, patient was hemodynamically stable.  He was evaluated by teleneurology. CT of the head was negative for hemorrhage or acute intracranial pathology.   CBC unremarkable.  BMP with sodium 142, potassium 4, creatinine elevated to 1.8 from a baseline of 1.35-1.45 TIA work-up initiated. Kept on observation under hospitalist service  Subjective: Patient was seen and examined this morning.  Pleasant elderly African-American male.  Lying down in bed.  Not in distress.  Wife at bedside.  Patient feels better.  Symptoms resolved.  Hoping to go home today. Chart reviewed Overnight heart rate in 85U, systolic blood pressure between 110 and 130 mostly No repeat BMP  available this morning. A1c 6.9, HDL 61, LDL 103  Hospital course: TIA (transient ischemic attack) -Presented with slurred speech, facial droop.  Symptoms resolved by the time of presentation to the ED  -Seen by neurology.  TIA work-up initiated.   -CT head/MRI brain did not show any acute intracranial process. -MRA head duplex show any large vessel occlusion but showed focalnonopacification of right M2 branch and focal stenosis of the left M2. -Carotid duplex showed 1 to 39% stenosis in both carotids, nonsignificant. -Echocardiogram with EF 45 to 31%, grade 1 diastolic dysfunction, no cardiac source of embolism -A1c 6.9, LDL 61, LDL 103 -Prior to admission, patient was not aspirin 81 mg daily, Plavix 75 mg daily, Crestor 40 mg daily.  Per discussion with neurology and patient's cardiology, aspirin was continued but Plavix was switched to Brilinta for month.  Patient to follow-up with his cardiologist Dr. [name] and his vascular surgeon Dr. Carlis Abbott in that interval.  Tentative plan is to put him back on Plavix after a month.    AKI on CKD 3a -Baseline creatinine 1.37 with GFR 51 on 11/09/2020 -Presented with creatinine elevated to 1.8.  Probably because of poor hydration.  Patient was given IV hydration in the ED.  I have suggested patient to return to his PCP in a week for repeat BMP.  Patient and wife verbalized understanding. Recent Labs    11/08/20 1030 11/09/20 0219 03/12/21 2044 03/12/21 2050  BUN 29* 24* 27* 31*  CREATININE 1.47* 1.37* 1.89* 1.80*   Chronic combined systolic and diastolic CHF Essential hypertension -  Home meds include Coreg, Entresto, Jardiance, amlodipine, Lasix -Continue the same. -Echocardiogram with EF 45 to 50%.  Patient has a follow-up with his cardiologist Dr. [name] in 2 weeks.  Type 2 diabetes mellitus -A1c 6.9 on 03/13/2021 -Home meds include Jardiance.  Continue the same  AS s/p TAVR  -  Mobility: Seen by PT, OT Living condition: Lives at home with  his wife Goals of care:   Code Status: Prior  Nutritional status: Body mass index is 30.86 kg/m.       Discharge Medications:   Allergies as of 03/13/2021   No Known Allergies      Medication List     TAKE these medications    amLODipine 10 MG tablet Commonly known as: NORVASC TAKE 1 TABLET BY MOUTH EVERY DAY   aspirin 81 MG EC tablet Take 81 mg by mouth daily. Swallow whole.   Brilinta 90 MG Tabs tablet Generic drug: ticagrelor Take 1 tablet (90 mg total) by mouth 2 (two) times daily.   carvedilol 3.125 MG tablet Commonly known as: COREG TAKE 1 TABLET (3.125 MG TOTAL) BY MOUTH 2 (TWO) TIMES DAILY. What changed: when to take this   clopidogrel 75 MG tablet Commonly known as: PLAVIX Take 1 tablet (75 mg total) by mouth daily. Start taking on: April 14, 2021 What changed:  when to take this These instructions start on April 14, 2021. If you are unsure what to do until then, ask your doctor or other care provider.   Entresto 49-51 MG Generic drug: sacubitril-valsartan Take 1 tablet by mouth 2 (two) times daily.   ferrous sulfate 325 (65 FE) MG tablet Take 325 mg by mouth daily with breakfast.   finasteride 5 MG tablet Commonly known as: PROSCAR Take 5 mg by mouth daily.   furosemide 40 MG tablet Commonly known as: LASIX TAKE 1.5 TABLETS DAILY. What changed:  how much to take how to take this when to take this additional instructions   Jardiance 25 MG Tabs tablet Generic drug: empagliflozin Take 25 mg by mouth daily.   Lovaza 1 g capsule Generic drug: omega-3 acid ethyl esters TAKE 1 CAPSULE BY MOUTH EVERY DAY What changed: how much to take   multivitamin with minerals Tabs tablet Take 1 tablet by mouth daily.   naproxen sodium 220 MG tablet Commonly known as: ALEVE Take 220 mg by mouth daily as needed (pain).   nitroGLYCERIN 0.2 mg/hr patch Commonly known as: NITRODUR - Dosed in mg/24 hr Place 1 patch (0.2 mg total) onto the skin  daily.   pentoxifylline 400 MG CR tablet Commonly known as: TRENTAL TAKE 1 TABLET BY MOUTH 3 TIMES DAILY WITH MEALS.   rosuvastatin 20 MG tablet Commonly known as: CRESTOR Take 20 mg by mouth daily.   tamsulosin 0.4 MG Caps capsule Commonly known as: FLOMAX Take 0.4 mg by mouth daily.   VITAMIN B COMPLEX-C PO Take 1 tablet by mouth daily.   Vitamin D 50 MCG (2000 UT) tablet Take 2,000 Units by mouth daily.        Wound care:   Incision (Closed) 11/08/20 Foot Right (Active)  Date First Assessed/Time First Assessed: 11/08/20 1319   Location: Foot  Location Orientation: Right    Assessments 11/08/2020  1:31 PM 11/09/2020 10:00 AM  Dressing Type Other (Comment) Compression wrap;Gauze (Comment);Negative pressure wound therapy  Dressing Clean;Dry;Intact Clean;Dry;Intact  Site / Wound Assessment -- Dressing in place / Unable to assess  Drainage Amount None Minimal  Drainage Description -- Sanguineous  Treatment -- Negative pressure wound therapy     No Linked orders to display    Discharge Instructions:   Discharge Instructions     Ambulatory referral to Neurology   Complete by: As directed    Follow up with stroke clinic NP (Jessica Vanschaick or Cecille Rubin, if both not available, consider Zachery Dauer, or A[place]) at [facility] in about 4 weeks. Thanks.   Call MD for:  difficulty breathing, headache or visual disturbances   Complete by: As directed    Call MD for:  extreme fatigue   Complete by: As directed    Call MD for:  hives   Complete by: As directed    Call MD for:  persistant dizziness or light-headedness   Complete by: As directed    Call MD for:  persistant nausea and vomiting   Complete by: As directed    Call MD for:  severe uncontrolled pain   Complete by: As directed    Call MD for:  temperature >100.4   Complete by: As directed    Diet - low sodium heart healthy   Complete by: As directed    Discharge instructions   Complete by: As directed     General discharge instructions:  Follow with Primary MD [name], MD in 7 days   Get CBC/BMP checked in next visit within 1 week by PCP or SNF MD. (We routinely change or add medications that can affect your baseline labs and fluid status, therefore we recommend that you get the mentioned basic workup next visit with your PCP, your PCP may decide not to get them or add new tests based on their clinical decision)  On your next visit with your PCP, please get your medicines reviewed and adjusted.  Please request your PCP  to go over all hospital tests, procedures, radiology results at the follow up, please get all Hospital records sent to your PCP by signing hospital release before you go home.  Activity: As tolerated with Full fall precautions use walker/cane & assistance as needed  Avoid using any recreational substances like cigarette, tobacco, alcohol, or non-prescribed drug.  If you experience worsening of your admission symptoms, develop shortness of breath, life threatening emergency, suicidal or homicidal thoughts you must seek medical attention immediately by calling 911 or calling your MD immediately  if symptoms less severe.  You must read complete instructions/literature along with all the possible adverse reactions/side effects for all the medicines you take and that have been prescribed to you. Take any new medicine only after you have completely understood and accepted all the possible adverse reactions/side effects.   Do not drive, operate heavy machinery, perform activities at heights, swimming or participation in water activities or provide baby sitting services if your were admitted for syncope or siezures until you have seen by Primary MD or a Neurologist and advised to do so again.  Do not drive when taking Pain medications.  Do not take more than prescribed Pain, Sleep and Anxiety Medications  Wear Seat belts while driving.  Please note You were cared for by a  hospitalist during your hospital stay. If you have any questions about your discharge medications or the care you received while you were in the hospital after you are discharged, you can call the unit and asked to speak with the hospitalist on call if the hospitalist that took care of you is not available. Once you are discharged, your primary care physician will handle any further medical issues.  Please note that NO REFILLS for any discharge medications will be authorized once you are discharged, as it is imperative that you return to your primary care physician (or establish a relationship with a primary care physician if you do not have one) for your aftercare needs so that they can reassess your need for medications and monitor your lab values.   Increase activity slowly   Complete by: As directed        Follow ups:    Follow-up Information     [facility]. Schedule an appointment as soon as possible for a visit in 1 month(s).   Specialty: Neurology Why: stroke clinic Contact information: [address] [place] [phone]        [name], MD Follow up.   Specialty: Internal Medicine Contact information: [address] [place] [address] [phone]         [name], MD .   Specialty: Cardiology Contact information: [phone] [address] [place] [address] [phone]                 Discharge Exam:   Vitals:   03/13/21 1145 03/13/21 1215 03/13/21 1315 03/13/21 1330  BP: (!) 171/80 130/68 122/75   Pulse: (!) 54 (!) 48 (!) 50 (!) 52  Resp: 14 15 15 16   Temp:    98.2 F (36.8 C)  TempSrc:    Oral  SpO2: 97% 98% 100% 99%  Weight:        Body mass index is 30.86 kg/m.  General exam: Pleasant, elderly African-American male.  Not in distress Skin: No rashes, lesions or ulcers. HEENT: Atraumatic, normocephalic, no obvious bleeding Lungs: Clear to  auscultation bilaterally CVS: Regular rate and rhythm, no murmur GI/Abd soft, nontender, nondistended, bowel sound present CNS: Alert, awake, oriented x3 Psychiatry: Mood appropriate Extremities: No pedal edema, no calf tenderness  Time coordinating discharge: 35 minutes   The results of significant diagnostics from this hospitalization (including imaging, microbiology, ancillary and laboratory) are listed below for reference.    Procedures and Diagnostic Studies:   MR ANGIO HEAD WO CONTRAST  Result Date: 03/13/2021 CLINICAL DATA:  Transient ischemic attack EXAM: MRI HEAD WITHOUT CONTRAST MRA HEAD WITHOUT CONTRAST TECHNIQUE: Multiplanar, multi-echo pulse sequences of the brain and surrounding structures were acquired without intravenous contrast. Angiographic images of the Circle of Willis were acquired using MRA technique without intravenous contrast. COMPARISON:  No prior MRI, correlation is made with CT head 03/12/2021 FINDINGS: MRI HEAD FINDINGS Brain: No restricted diffusion to suggest acute or subacute infarction. No acute hemorrhage, mass, mass effect, or midline shift. No hydrocephalus or extra-axial collection. Confluent T2 hyperintense signal in the periventricular white matter, likely the sequela of severe chronic small vessel ischemic disease. No foci of hemosiderin deposition to suggest remote hemorrhage. Generalized cerebral atrophy, without lobar predominance, likely within normal limits for age. Vascular: Normal flow voids.  Please see MRA findings below. Skull and upper cervical spine: Normal marrow signal. Degenerative changes in the cervical spine, with at least mild spinal canal stenosis at C3-C4. Sinuses/Orbits: No acute or significant finding. Other: The mastoids are well aerated. MRA HEAD FINDINGS Anterior circulation: Both internal carotid arteries are patent to the termini, without stenosis or other abnormality. A1 segments patent. Normal anterior communicating artery.  Anterior cerebral arteries are patent to their distal aspects. No M1 stenosis or occlusion. At the right MCA bifurcation, there is focal non opacification of a  segment of right M2 (series 5, images 69-77, which is likely chronic as there are multiple collateral vessels in the area. Focal narrowing of a left M2 branch proximally, just distal to the bifurcation (series 5, image 62); this is otherwise patent. Distal MCA branches perfused and symmetric. Posterior circulation: Vertebral arteries patent to the vertebrobasilar junction without stenosis. Posterior inferior cerebral arteries patent bilaterally. Basilar patent to its distal aspect. Superior cerebellar arteries patent bilaterally. PCAs perfused to their distal aspects without stenosis. Near fetal origin of the left PCA, with a dominant left posterior communicating artery and a diminutive left P1. Normal right P1. The right posterior communicating artery is not visualized. Anatomic variants: None significant IMPRESSION: 1. No acute intracranial process. 2. Focal non opacification of a proximal M2 branch, with distal reconstitution. This may be chronic, as there are multiple small vessels in this area. 3. Focal stenosis at the origin of a left M2 branch, which is otherwise patent. Electronically Signed   By: Merilyn Baba M.D.   On: 03/13/2021 01:19   MR BRAIN WO CONTRAST  Result Date: 03/13/2021 CLINICAL DATA:  Transient ischemic attack EXAM: MRI HEAD WITHOUT CONTRAST MRA HEAD WITHOUT CONTRAST TECHNIQUE: Multiplanar, multi-echo pulse sequences of the brain and surrounding structures were acquired without intravenous contrast. Angiographic images of the Circle of Willis were acquired using MRA technique without intravenous contrast. COMPARISON:  No prior MRI, correlation is made with CT head 03/12/2021 FINDINGS: MRI HEAD FINDINGS Brain: No restricted diffusion to suggest acute or subacute infarction. No acute hemorrhage, mass, mass effect, or midline shift.  No hydrocephalus or extra-axial collection. Confluent T2 hyperintense signal in the periventricular white matter, likely the sequela of severe chronic small vessel ischemic disease. No foci of hemosiderin deposition to suggest remote hemorrhage. Generalized cerebral atrophy, without lobar predominance, likely within normal limits for age. Vascular: Normal flow voids.  Please see MRA findings below. Skull and upper cervical spine: Normal marrow signal. Degenerative changes in the cervical spine, with at least mild spinal canal stenosis at C3-C4. Sinuses/Orbits: No acute or significant finding. Other: The mastoids are well aerated. MRA HEAD FINDINGS Anterior circulation: Both internal carotid arteries are patent to the termini, without stenosis or other abnormality. A1 segments patent. Normal anterior communicating artery. Anterior cerebral arteries are patent to their distal aspects. No M1 stenosis or occlusion. At the right MCA bifurcation, there is focal non opacification of a segment of right M2 (series 5, images 69-77, which is likely chronic as there are multiple collateral vessels in the area. Focal narrowing of a left M2 branch proximally, just distal to the bifurcation (series 5, image 62); this is otherwise patent. Distal MCA branches perfused and symmetric. Posterior circulation: Vertebral arteries patent to the vertebrobasilar junction without stenosis. Posterior inferior cerebral arteries patent bilaterally. Basilar patent to its distal aspect. Superior cerebellar arteries patent bilaterally. PCAs perfused to their distal aspects without stenosis. Near fetal origin of the left PCA, with a dominant left posterior communicating artery and a diminutive left P1. Normal right P1. The right posterior communicating artery is not visualized. Anatomic variants: None significant IMPRESSION: 1. No acute intracranial process. 2. Focal non opacification of a proximal M2 branch, with distal reconstitution. This may be  chronic, as there are multiple small vessels in this area. 3. Focal stenosis at the origin of a left M2 branch, which is otherwise patent. Electronically Signed   By: Merilyn Baba M.D.   On: 03/13/2021 01:19   CT HEAD CODE STROKE WO CONTRAST  Result Date: 03/12/2021 CLINICAL DATA:  Code stroke. EXAM: CT HEAD WITHOUT CONTRAST TECHNIQUE: Contiguous axial images were obtained from the base of the skull through the vertex without intravenous contrast. COMPARISON:  None. FINDINGS: Brain: No evidence of acute infarction, hemorrhage, cerebral edema, mass, mass effect, or midline shift. Ventricles and sulci are normal for age. No extra-axial fluid collection. Periventricular white matter changes, likely the sequela of chronic small vessel ischemic disease. Generalized atrophy, without lobar predominance. Vascular: No hyperdense vessel or unexpected calcification. Skull: Normal. Negative for fracture or focal lesion. Sinuses/Orbits: No acute finding. Other: The mastoid air cells are well aerated. ASPECTS [employer] [facility] Stroke Program Early CT Score) - Ganglionic level infarction (caudate, lentiform nuclei, internal capsule, insula, M1-M3 cortex): 7 - Supraganglionic infarction (M4-M6 cortex): 3 Total score (0-10 with 10 being normal): 10 IMPRESSION: 1. No acute intracranial process. 2. ASPECTS is 10 Code stroke imaging results were communicated on 03/12/2021 at 9:04 pm to provider [place] via secure text paging. Electronically Signed   By: Merilyn Baba M.D.   On: 03/12/2021 21:04   VAS US CAROTID (at [facility] and [facility] only)  Result Date: 03/13/2021 Carotid Arterial Duplex Study Patient Name:  TAUNO FALOTICO  Date of Exam:   03/13/2021 Medical Rec #: [id number]      Accession #:    [id number] Date of Birth: 10-26-1933      Patient Gender: M Patient Age:   87 years Exam Location:  [facility] Procedure:      VAS US CAROTID Referring Phys: Harrold Donath  --------------------------------------------------------------------------------  Indications:       TIA. Risk Factors:      Hypertension, Diabetes, past history of smoking, coronary                    artery disease, PAD. Other Factors:     S/P TAVR. Comparison Study:  11-13-2016 Carotid duplex showed 1-39% ICA stenosis                    bilaterally. Performing Technologist: Darlin Coco RDMS, RVT  Examination Guidelines: A complete evaluation includes B-mode imaging, spectral Doppler, color Doppler, and power Doppler as needed of all accessible portions of each vessel. Bilateral testing is considered an integral part of a complete examination. Limited examinations for reoccurring indications may be performed as noted.  Right Carotid Findings: +----------+--------+-------+--------+----------------------+------------------+             PSV cm/s EDV     Stenosis Plaque Description     Comments                                 cm/s                                                        +----------+--------+-------+--------+----------------------+------------------+  CCA Prox   72       13                                      intimal thickening  +----------+--------+-------+--------+----------------------+------------------+  CCA Distal 65       11  intimal thickening  +----------+--------+-------+--------+----------------------+------------------+  ICA Prox   90       21      1-39%    smooth and                                                                       heterogenous                               +----------+--------+-------+--------+----------------------+------------------+  ICA Distal 71       21                                                          +----------+--------+-------+--------+----------------------+------------------+  ECA        72       9                                                            +----------+--------+-------+--------+----------------------+------------------+ +----------+--------+-------+----------------+-------------------+             PSV cm/s EDV cms Describe         Arm Pressure (mmHG)  +----------+--------+-------+----------------+-------------------+  Subclavian 70               Multiphasic, WNL                      +----------+--------+-------+----------------+-------------------+ +---------+--------+--+--------+-+---------+  Vertebral PSV cm/s 38 EDV cm/s 8 Antegrade  +---------+--------+--+--------+-+---------+  Left Carotid Findings: +----------+--------+-------+--------+----------------------+------------------+             PSV cm/s EDV     Stenosis Plaque Description     Comments                                 cm/s                                                        +----------+--------+-------+--------+----------------------+------------------+  CCA Prox   70       16                                      intimal thickening  +----------+--------+-------+--------+----------------------+------------------+  CCA Distal 61       13                                      intimal thickening  +----------+--------+-------+--------+----------------------+------------------+  ICA Prox   64       14  1-39%    smooth and                                                                       heterogenous                               +----------+--------+-------+--------+----------------------+------------------+  ICA Distal 66       14                                                          +----------+--------+-------+--------+----------------------+------------------+  ECA        81       10                                                          +----------+--------+-------+--------+----------------------+------------------+ +----------+--------+--------+----------------+-------------------+             PSV cm/s EDV cm/s Describe         Arm Pressure (mmHG)   +----------+--------+--------+----------------+-------------------+  Subclavian 115               Multiphasic, WNL                      +----------+--------+--------+----------------+-------------------+ +---------+--------+--+--------+--+---------+  Vertebral PSV cm/s 69 EDV cm/s 18 Antegrade  +---------+--------+--+--------+--+---------+   Summary: Right Carotid: Velocities in the right ICA are consistent with a 1-39% stenosis. Left Carotid: Velocities in the left ICA are consistent with a 1-39% stenosis. Vertebrals:  Bilateral vertebral arteries demonstrate antegrade flow. Subclavians: Normal flow hemodynamics were seen in bilateral subclavian              arteries. *See table(s) above for measurements and observations.     Preliminary      Labs:   Basic Metabolic Panel: Recent Labs  Lab 03/12/21 2044 03/12/21 2050  NA 136 142  K 3.9 4.0  CL 108 108  CO2 23  --   GLUCOSE 157* 150*  BUN 27* 31*  CREATININE 1.89* 1.80*  CALCIUM 9.0  --    GFR Estimated Creatinine Clearance: 37.3 mL/min (A) (by C-G formula based on SCr of 1.8 mg/dL (H)). Liver Function Tests: Recent Labs  Lab 03/12/21 2044  AST 31  ALT 19  ALKPHOS 48  BILITOT 0.5  PROT 7.1  ALBUMIN 3.7   No results for input(s): LIPASE, AMYLASE in the last 168 hours. No results for input(s): AMMONIA in the last 168 hours. Coagulation profile Recent Labs  Lab 03/12/21 2044  INR 1.0    CBC: Recent Labs  Lab 03/12/21 2044 03/12/21 2050  WBC 5.7  --   NEUTROABS 3.0  --   HGB 13.4 14.6  HCT 41.7 43.0  MCV 92.3  --   PLT PLATELET CLUMPS NOTED ON SMEAR, UNABLE TO ESTIMATE  --    Cardiac Enzymes: No  results for input(s): CKTOTAL, CKMB, CKMBINDEX, TROPONINI in the last 168 hours. BNP: Invalid input(s): POCBNP CBG: Recent Labs  Lab 03/12/21 2044  GLUCAP 153*   D-Dimer No results for input(s): DDIMER in the last 72 hours. Hgb A1c Recent Labs    03/13/21 0319  HGBA1C 6.9*   Lipid Profile Recent Labs     03/13/21 0319  CHOL 180  HDL 61  LDLCALC 103*  TRIG 78  CHOLHDL 3.0   Thyroid function studies No results for input(s): TSH, T4TOTAL, T3FREE, THYROIDAB in the last 72 hours.  Invalid input(s): FREET3 Anemia work up No results for input(s): VITAMINB12, FOLATE, FERRITIN, TIBC, IRON, RETICCTPCT in the last 72 hours. Microbiology Recent Results (from the past 240 hour(s))  Resp Panel by RT-PCR (Flu A&B, Covid) Nasopharyngeal Swab     Status: None   Collection Time: 03/12/21 10:33 PM   Specimen: Nasopharyngeal Swab; Nasopharyngeal(NP) swabs in vial transport medium  Result Value Ref Range Status   SARS Coronavirus 2 by RT PCR NEGATIVE NEGATIVE Final    Comment: (NOTE) SARS-CoV-2 target nucleic acids are NOT DETECTED.  The SARS-CoV-2 RNA is generally detectable in upper respiratory specimens during the acute phase of infection. The lowest concentration of SARS-CoV-2 viral copies this assay can detect is 138 copies/mL. A negative result does not preclude SARS-Cov-2 infection and should not be used as the sole basis for treatment or other patient management decisions. A negative result may occur with  improper specimen collection/handling, submission of specimen other than nasopharyngeal swab, presence of viral mutation(s) within the areas targeted by this assay, and inadequate number of viral copies(<138 copies/mL). A negative result must be combined with clinical observations, patient history, and epidemiological information. The expected result is Negative.  Fact Sheet for Patients:  EntrepreneurPulse.com.au  Fact Sheet for Healthcare Providers:  IncredibleEmployment.be  This test is no t yet approved or cleared by the [place] FDA and  has been authorized for detection and/or diagnosis of SARS-CoV-2 by FDA under an Emergency Use Authorization (EUA). This EUA will remain  in effect (meaning this test can be used) for the duration of  the COVID-19 declaration under Section 564(b)(1) of the Act, 21 U.S.C.section 360bbb-3(b)(1), unless the authorization is terminated  or revoked sooner.       Influenza A by PCR NEGATIVE NEGATIVE Final   Influenza B by PCR NEGATIVE NEGATIVE Final    Comment: (NOTE) The Xpert Xpress SARS-CoV-2/FLU/RSV plus assay is intended as an aid in the diagnosis of influenza from Nasopharyngeal swab specimens and should not be used as a sole basis for treatment. Nasal washings and aspirates are unacceptable for Xpert Xpress SARS-CoV-2/FLU/RSV testing.  Fact Sheet for Patients: EntrepreneurPulse.com.au  Fact Sheet for Healthcare Providers: IncredibleEmployment.be  This test is not yet approved or cleared by the [place] FDA and has been authorized for detection and/or diagnosis of SARS-CoV-2 by FDA under an Emergency Use Authorization (EUA). This EUA will remain in effect (meaning this test can be used) for the duration of the COVID-19 declaration under Section 564(b)(1) of the Act, 21 U.S.C. section 360bbb-3(b)(1), unless the authorization is terminated or revoked.  Performed at [facility], [place] [address], [place], [place] [address]      Signed: Terrilee Croak  [facility] 03/15/2021, 7:13 AM

## 2021-03-16 ENCOUNTER — Other Ambulatory Visit: Payer: Self-pay

## 2021-03-16 ENCOUNTER — Observation Stay (HOSPITAL_COMMUNITY)
Admission: EM | Admit: 2021-03-16 | Discharge: 2021-03-17 | Disposition: A | Discharge status: Home / Self Care | Payer: Medicare HMO | Attending: Emergency Medicine | Admitting: Emergency Medicine

## 2021-03-16 ENCOUNTER — Emergency Department (HOSPITAL_COMMUNITY): Payer: Medicare HMO

## 2021-03-16 ENCOUNTER — Observation Stay (HOSPITAL_COMMUNITY): Payer: Medicare HMO

## 2021-03-16 ENCOUNTER — Encounter (HOSPITAL_COMMUNITY): Payer: Self-pay

## 2021-03-16 DIAGNOSIS — G459 Transient cerebral ischemic attack, unspecified: Principal | ICD-10-CM

## 2021-03-16 DIAGNOSIS — Z7984 Long term (current) use of oral hypoglycemic drugs: Secondary | ICD-10-CM | POA: Diagnosis not present

## 2021-03-16 DIAGNOSIS — Z7982 Long term (current) use of aspirin: Secondary | ICD-10-CM | POA: Insufficient documentation

## 2021-03-16 DIAGNOSIS — I10 Essential (primary) hypertension: Secondary | ICD-10-CM | POA: Diagnosis not present

## 2021-03-16 DIAGNOSIS — N1832 Chronic kidney disease, stage 3b: Secondary | ICD-10-CM | POA: Diagnosis not present

## 2021-03-16 DIAGNOSIS — G319 Degenerative disease of nervous system, unspecified: Secondary | ICD-10-CM | POA: Diagnosis not present

## 2021-03-16 DIAGNOSIS — R531 Weakness: Secondary | ICD-10-CM | POA: Diagnosis not present

## 2021-03-16 DIAGNOSIS — Z85038 Personal history of other malignant neoplasm of large intestine: Secondary | ICD-10-CM | POA: Diagnosis not present

## 2021-03-16 DIAGNOSIS — R2689 Other abnormalities of gait and mobility: Secondary | ICD-10-CM | POA: Diagnosis not present

## 2021-03-16 DIAGNOSIS — N183 Chronic kidney disease, stage 3 unspecified: Secondary | ICD-10-CM | POA: Diagnosis present

## 2021-03-16 DIAGNOSIS — Z79899 Other long term (current) drug therapy: Secondary | ICD-10-CM | POA: Insufficient documentation

## 2021-03-16 DIAGNOSIS — R471 Dysarthria and anarthria: Secondary | ICD-10-CM

## 2021-03-16 DIAGNOSIS — R2681 Unsteadiness on feet: Secondary | ICD-10-CM | POA: Diagnosis not present

## 2021-03-16 DIAGNOSIS — I130 Hypertensive heart and chronic kidney disease with heart failure and stage 1 through stage 4 chronic kidney disease, or unspecified chronic kidney disease: Secondary | ICD-10-CM | POA: Diagnosis not present

## 2021-03-16 DIAGNOSIS — I5042 Chronic combined systolic (congestive) and diastolic (congestive) heart failure: Secondary | ICD-10-CM | POA: Diagnosis not present

## 2021-03-16 DIAGNOSIS — U071 COVID-19: Secondary | ICD-10-CM | POA: Diagnosis not present

## 2021-03-16 DIAGNOSIS — R29898 Other symptoms and signs involving the musculoskeletal system: Secondary | ICD-10-CM | POA: Insufficient documentation

## 2021-03-16 DIAGNOSIS — I6601 Occlusion and stenosis of right middle cerebral artery: Secondary | ICD-10-CM | POA: Diagnosis not present

## 2021-03-16 DIAGNOSIS — E119 Type 2 diabetes mellitus without complications: Secondary | ICD-10-CM

## 2021-03-16 DIAGNOSIS — R0602 Shortness of breath: Secondary | ICD-10-CM | POA: Diagnosis not present

## 2021-03-16 DIAGNOSIS — E1122 Type 2 diabetes mellitus with diabetic chronic kidney disease: Secondary | ICD-10-CM | POA: Diagnosis not present

## 2021-03-16 DIAGNOSIS — Z87891 Personal history of nicotine dependence: Secondary | ICD-10-CM | POA: Insufficient documentation

## 2021-03-16 DIAGNOSIS — R29818 Other symptoms and signs involving the nervous system: Secondary | ICD-10-CM | POA: Diagnosis not present

## 2021-03-16 DIAGNOSIS — R4701 Aphasia: Secondary | ICD-10-CM | POA: Diagnosis not present

## 2021-03-16 DIAGNOSIS — I672 Cerebral atherosclerosis: Secondary | ICD-10-CM | POA: Diagnosis not present

## 2021-03-16 DIAGNOSIS — R778 Other specified abnormalities of plasma proteins: Secondary | ICD-10-CM

## 2021-03-16 DIAGNOSIS — R4781 Slurred speech: Secondary | ICD-10-CM | POA: Diagnosis not present

## 2021-03-16 DIAGNOSIS — R82998 Other abnormal findings in urine: Secondary | ICD-10-CM

## 2021-03-16 DIAGNOSIS — I6782 Cerebral ischemia: Secondary | ICD-10-CM | POA: Diagnosis not present

## 2021-03-16 DIAGNOSIS — I129 Hypertensive chronic kidney disease with stage 1 through stage 4 chronic kidney disease, or unspecified chronic kidney disease: Secondary | ICD-10-CM | POA: Diagnosis not present

## 2021-03-16 LAB — DIFFERENTIAL
Abs Immature Granulocytes: 0.02 10*3/uL (ref 0.00–0.07)
Basophils Absolute: 0 10*3/uL (ref 0.0–0.1)
Basophils Relative: 1 %
Eosinophils Absolute: 0 10*3/uL (ref 0.0–0.5)
Eosinophils Relative: 1 %
Immature Granulocytes: 0 %
Lymphocytes Relative: 19 %
Lymphs Abs: 1.1 10*3/uL (ref 0.7–4.0)
Monocytes Absolute: 1.6 10*3/uL — ABNORMAL HIGH (ref 0.1–1.0)
Monocytes Relative: 26 %
Neutro Abs: 3.3 10*3/uL (ref 1.7–7.7)
Neutrophils Relative %: 53 %

## 2021-03-16 LAB — I-STAT CHEM 8, ED
BUN: 34 mg/dL — ABNORMAL HIGH (ref 8–23)
Calcium, Ion: 1.11 mmol/L — ABNORMAL LOW (ref 1.15–1.40)
Chloride: 103 mmol/L (ref 98–111)
Creatinine, Ser: 1.8 mg/dL — ABNORMAL HIGH (ref 0.61–1.24)
Glucose, Bld: 122 mg/dL — ABNORMAL HIGH (ref 70–99)
HCT: 44 % (ref 39.0–52.0)
Hemoglobin: 15 g/dL (ref 13.0–17.0)
Potassium: 3.5 mmol/L (ref 3.5–5.1)
Sodium: 138 mmol/L (ref 135–145)
TCO2: 25 mmol/L (ref 22–32)

## 2021-03-16 LAB — CBC
HCT: 43.7 % (ref 39.0–52.0)
Hemoglobin: 13.6 g/dL (ref 13.0–17.0)
MCH: 28.6 pg (ref 26.0–34.0)
MCHC: 31.1 g/dL (ref 30.0–36.0)
MCV: 91.8 fL (ref 80.0–100.0)
Platelets: UNDETERMINED 10*3/uL (ref 150–400)
RBC: 4.76 MIL/uL (ref 4.22–5.81)
RDW: 14.5 % (ref 11.5–15.5)
WBC: 6 10*3/uL (ref 4.0–10.5)
nRBC: 0 % (ref 0.0–0.2)

## 2021-03-16 LAB — URINALYSIS, ROUTINE W REFLEX MICROSCOPIC
Bilirubin Urine: NEGATIVE
Glucose, UA: >=500 mg/dL — AB
Ketones, ur: NEGATIVE mg/dL
Nitrite: POSITIVE — AB
Protein, ur: NEGATIVE mg/dL
Specific Gravity, Urine: 1.01 (ref 1.005–1.030)
pH: 5 (ref 5.0–8.0)

## 2021-03-16 LAB — RESP PANEL BY RT-PCR (FLU A&B, COVID) ARPGX2
Influenza A by PCR: NEGATIVE
Influenza B by PCR: NEGATIVE
SARS Coronavirus 2 by RT PCR: POSITIVE — AB

## 2021-03-16 LAB — COMPREHENSIVE METABOLIC PANEL
ALT: 20 U/L (ref 0–44)
AST: 34 U/L (ref 15–41)
Albumin: 3.6 g/dL (ref 3.5–5.0)
Alkaline Phosphatase: 44 U/L (ref 38–126)
Anion gap: 10 (ref 5–15)
BUN: 31 mg/dL — ABNORMAL HIGH (ref 8–23)
CO2: 24 mmol/L (ref 22–32)
Calcium: 8.9 mg/dL (ref 8.9–10.3)
Chloride: 102 mmol/L (ref 98–111)
Creatinine, Ser: 1.79 mg/dL — ABNORMAL HIGH (ref 0.61–1.24)
GFR, Estimated: 37 mL/min — ABNORMAL LOW (ref >60)
Glucose, Bld: 124 mg/dL — ABNORMAL HIGH (ref 70–99)
Potassium: 3.5 mmol/L (ref 3.5–5.1)
Sodium: 136 mmol/L (ref 135–145)
Total Bilirubin: 0.8 mg/dL (ref 0.3–1.2)
Total Protein: 7.5 g/dL (ref 6.5–8.1)

## 2021-03-16 LAB — URINALYSIS, MICROSCOPIC (REFLEX): WBC, UA: >50 WBC/hpf (ref 0–5)

## 2021-03-16 LAB — PROTIME-INR
INR: 1 (ref 0.8–1.2)
Prothrombin Time: 13.3 seconds (ref 11.4–15.2)

## 2021-03-16 LAB — CBG MONITORING, ED: Glucose-Capillary: 111 mg/dL — ABNORMAL HIGH (ref 70–99)

## 2021-03-16 LAB — APTT: aPTT: 31 seconds (ref 24–36)

## 2021-03-16 LAB — TROPONIN I (HIGH SENSITIVITY)
Troponin I (High Sensitivity): 73 ng/L — ABNORMAL HIGH (ref <18)
Troponin I (High Sensitivity): 53 ng/L — ABNORMAL HIGH (ref <18)

## 2021-03-16 LAB — BRAIN NATRIURETIC PEPTIDE: B Natriuretic Peptide: 207.8 pg/mL — ABNORMAL HIGH (ref 0.0–100.0)

## 2021-03-16 MED ORDER — ROSUVASTATIN CALCIUM 20 MG PO TABS
20.0000 mg | ORAL_TABLET | Freq: Every day | ORAL | Status: DC
  Administered 2021-03-17 (×2): 20 mg via ORAL
  Filled 2021-03-16 (×2): qty 1

## 2021-03-16 MED ORDER — TAMSULOSIN HCL 0.4 MG PO CAPS
0.4000 mg | ORAL_CAPSULE | Freq: Every day | ORAL | Status: DC
  Administered 2021-03-17 (×2): 0.4 mg via ORAL
  Filled 2021-03-16 (×2): qty 1

## 2021-03-16 MED ORDER — STROKE: EARLY STAGES OF RECOVERY BOOK: Freq: Once | Status: DC

## 2021-03-16 MED ORDER — SODIUM CHLORIDE 0.9 % IV SOLN
1.0000 g | Freq: Once | INTRAVENOUS | Status: AC
  Administered 2021-03-16: 19:00:00 1 g via INTRAVENOUS
  Filled 2021-03-16: qty 10

## 2021-03-16 MED ORDER — NIRMATRELVIR/RITONAVIR (PAXLOVID) TABLET (RENAL DOSING)
2.0000 | ORAL_TABLET | Freq: Two times a day (BID) | ORAL | Status: DC
Start: 2021-03-16 — End: 2021-03-16

## 2021-03-16 MED ORDER — SACUBITRIL-VALSARTAN 49-51 MG PO TABS
1.0000 | ORAL_TABLET | Freq: Two times a day (BID) | ORAL | Status: DC
  Administered 2021-03-17 (×2): 1 via ORAL
  Filled 2021-03-16 (×3): qty 1

## 2021-03-16 MED ORDER — FINASTERIDE 5 MG PO TABS
5.0000 mg | ORAL_TABLET | Freq: Every day | ORAL | Status: DC
  Administered 2021-03-17 (×2): 5 mg via ORAL
  Filled 2021-03-16 (×2): qty 1

## 2021-03-16 MED ORDER — IOHEXOL 350 MG/ML SOLN
60.0000 mL | Freq: Once | INTRAVENOUS | Status: AC | PRN
  Administered 2021-03-16: 17:00:00 60 mL via INTRAVENOUS

## 2021-03-16 MED ORDER — ASPIRIN EC 81 MG PO TBEC
81.0000 mg | DELAYED_RELEASE_TABLET | Freq: Every day | ORAL | Status: DC
  Administered 2021-03-17: 10:00:00 81 mg via ORAL
  Filled 2021-03-16: qty 1

## 2021-03-16 MED ORDER — LACTATED RINGERS IV SOLN: INTRAVENOUS | Status: DC

## 2021-03-16 MED ORDER — FUROSEMIDE 20 MG PO TABS
60.0000 mg | ORAL_TABLET | Freq: Every day | ORAL | Status: DC
  Administered 2021-03-17 (×2): 60 mg via ORAL
  Filled 2021-03-16 (×2): qty 3

## 2021-03-16 MED ORDER — ACETAMINOPHEN 650 MG RE SUPP: 650.0000 mg | RECTAL | Status: DC | PRN

## 2021-03-16 MED ORDER — ACETAMINOPHEN 160 MG/5ML PO SOLN: 650.0000 mg | ORAL | Status: DC | PRN

## 2021-03-16 MED ORDER — HEPARIN SODIUM (PORCINE) 5000 UNIT/ML IJ SOLN
5000.0000 [IU] | Freq: Three times a day (TID) | INTRAMUSCULAR | Status: DC
Start: 2021-03-16 — End: 2021-03-17
  Administered 2021-03-17 (×2): 5000 [IU] via SUBCUTANEOUS
  Filled 2021-03-16 (×2): qty 1

## 2021-03-16 MED ORDER — CARVEDILOL 3.125 MG PO TABS
3.1250 mg | ORAL_TABLET | Freq: Two times a day (BID) | ORAL | Status: DC
  Filled 2021-03-16: qty 1

## 2021-03-16 MED ORDER — SODIUM CHLORIDE 0.9% FLUSH
3.0000 mL | Freq: Once | INTRAVENOUS | Status: AC
  Administered 2021-03-16: 17:00:00 3 mL via INTRAVENOUS

## 2021-03-16 MED ORDER — SENNOSIDES-DOCUSATE SODIUM 8.6-50 MG PO TABS: 1.0000 | ORAL_TABLET | Freq: Every evening | ORAL | Status: DC | PRN

## 2021-03-16 MED ORDER — TICAGRELOR 90 MG PO TABS
90.0000 mg | ORAL_TABLET | Freq: Two times a day (BID) | ORAL | Status: DC
  Administered 2021-03-17 (×2): 90 mg via ORAL
  Filled 2021-03-16 (×2): qty 1

## 2021-03-16 MED ORDER — EMPAGLIFLOZIN 25 MG PO TABS
25.0000 mg | ORAL_TABLET | Freq: Every day | ORAL | Status: DC
  Administered 2021-03-17 (×2): 25 mg via ORAL
  Filled 2021-03-16 (×3): qty 1

## 2021-03-16 MED ORDER — ACETAMINOPHEN 325 MG PO TABS: 650.0000 mg | ORAL_TABLET | ORAL | Status: DC | PRN

## 2021-03-16 MED ORDER — MOLNUPIRAVIR EUA 200MG CAPSULE
4.0000 | ORAL_CAPSULE | Freq: Two times a day (BID) | ORAL | Status: DC
  Administered 2021-03-17 (×2): 800 mg via ORAL
  Filled 2021-03-16: qty 4

## 2021-03-16 NOTE — H&P (Signed)
History and Physical    Robert Horn YTK:[id number] DOB: [birth year] DOA: 03/16/2021  PCP: [name], MD   Patient coming from: Home  Chief Complaint: Slurred speech  HPI: Robert Horn is a 87 y.o. male with medical history significant for   DMT2, CHF, HTN, CAD, amputation part of right foot, aortic valve replacement who presents by EMS as a code stroke for alteration of speech.  Patient was admitted a few days ago for TIA and had complete work-up at that time including an echocardiogram.  He was found to have a mildly stenotic area of the MCA and was placed on Brilinta and aspirin in the hospital.  His wife reported that he developed slurred speech earlier today. Pt reports speech seemed a little different to him but is now back to normal. He reports having central chest discomfort earlier that did not radiate and no SOB. He does report mild dry cough. He has not felt well since his discharge from hospital 3 days ago. He reports decreased energy level, decreased appetite and sleeping more than normal.  He reports he was concerned of a CHF exacerbation or a heart attack with his chest discomfort.  Denies having any headache.  He denies any abdominal pain, nausea vomiting or diarrhea.  He has not had any nasal congestion or sore throat.  He has not had any fever and he has no known sick contacts at home.  He reports no urinary symptoms of dysuria or frequency.  He has not had any swelling of his legs.   ED Course: In the emergency room patient had some mild early soft blood pressures with systolic blood pressure in the low 100s.  He has not had any chest pain or tachycardia in the emergency room.  Speech is returned to normal and he states he feels fatigued and tired but is back to his normal baseline level of functioning.  He does have a mild dry cough.  He was seen by neurology in the emergency room with the slurred speech recommended repeat MRI of the brain and watching overnight.  He is found  to have an elevated troponin level of 73.  There are no baseline high-sensitivity troponins to compare to.  Is also found to be COVID-positive.  CBC unremarkable.  Troponin 73 BNP 207.  Sodium 136 potassium 3.5 chloride 102 bicarb 24 creatinine 1.79 which is baseline BUN 31 glucose 124.  Urinalysis is clear yellow with greater than 500 glucose trace hemoglobin small leukocytes and nitrite positive.  Urine cultures been sent by the ER physician and patient was given dose of Rocephin in the emergency room  Review of Systems:  General: Denies fever, chills, weight loss, night sweats.  Denies dizziness.  Denies change in appetite HENT: Denies head trauma, headache, denies change in hearing, tinnitus.  Denies nasal congestion or bleeding.  Denies sore throat.  Denies difficulty swallowing Eyes: Denies blurry vision, pain in eye, drainage.  Denies discoloration of eyes. Neck: Denies pain.  Denies swelling.  Denies pain with movement. Cardiovascular: Reports chest discomfort. Denies palpitations.  Denies edema.  Denies orthopnea Respiratory: Reports dry cough past few days. Denies shortness of breath.  Denies wheezing.  Denies sputum production Gastrointestinal: Denies abdominal pain, swelling.  Denies nausea, vomiting, diarrhea.  Denies melena.  Denies hematemesis. Musculoskeletal: Denies limitation of movement.  Denies deformity or swelling. Denies arthralgias or myalgias. Genitourinary: Denies pelvic pain.  Denies urinary frequency or hesitancy.  Denies dysuria.  Skin: Denies rash.  Denies petechiae, purpura, ecchymosis.  Neurological: Denies syncope. Denies seizure activity. Denies paresthesia. Denies visual change. Psychiatric: Denies depression, anxiety. Denies hallucinations.  Past Medical History:  Diagnosis Date   Anemia    Anxiety    situational- surgery   Asthma    as a child   Cancer ([facility]) 2019   colon- colectomy    CHF (congestive heart failure) ([place])    Chronic kidney disease     followed by Dr. Delfina Redwood   Coronary artery disease    Diabetes mellitus without complication ([place])    Type II   Dyslipidemia 10/27/2015   Dyspnea    w/ exertion    Elevated PSA 10/27/2015   Erectile dysfunction 10/27/2015   Heart murmur    Hypertension 10/27/2015   Hypogonadism male 10/27/2015   Neuropathy    Obesity 10/27/2015   Peripheral vascular disease ([place])    Pneumonia 10/27/2015   pt states was 1982   Rotator cuff tear 10/27/2015    Past Surgical History:  Procedure Laterality Date   ABDOMINAL AORTOGRAM N/A 02/01/2019   Procedure: ABDOMINAL AORTOGRAM;  Surgeon: Marty Heck, MD;  Location: [facility];  Service: Cardiovascular;  Laterality: N/A;   AMPUTATION Right 02/03/2019   Procedure: RIGHT FOOT 1ST RAY AMPUTATION;  Surgeon: [name], MD;  Location: [place];  Service: Orthopedics;  Laterality: Right;   AMPUTATION Right 08/02/2019   Procedure: RIGHT 2ND TOE AMPUTATION;  Surgeon: [name], MD;  Location: [place];  Service: Orthopedics;  Laterality: Right;   AMPUTATION Right 11/08/2020   Procedure: RIGHT TRANSMETATARSAL AMPUTATION;  Surgeon: [name], MD;  Location: [place];  Service: Orthopedics;  Laterality: Right;   APPLICATION OF WOUND VAC  11/08/2020   Procedure: APPLICATION OF WOUND VAC;  Surgeon: [name], MD;  Location: [place];  Service: Orthopedics;;   CARDIAC CATHETERIZATION N/A 11/14/2015   Procedure: Left Heart Cath and Coronary Angiography;  Surgeon: [name], MD;  Location: [facility];  Service: Cardiovascular;  Laterality: N/A;   COLONOSCOPY     LAPAROSCOPIC PARTIAL COLECTOMY  12/15/2017   LAPAROSCOPIC PARTIAL COLECTOMY (N/A Abdomen)   LAPAROSCOPIC PARTIAL COLECTOMY N/A 12/15/2017   Procedure: LAPAROSCOPIC PARTIAL COLECTOMY;  Surgeon: Erroll Luna, MD;  Location: [place];  Service: General;  Laterality: N/A;   LOWER EXTREMITY ANGIOGRAPHY Right 02/01/2019   Procedure: LOWER EXTREMITY ANGIOGRAPHY;  Surgeon: Marty Heck, MD;  Location: [facility];  Service: Cardiovascular;  Laterality: Right;   PERIPHERAL VASCULAR INTERVENTION Right 02/01/2019   Procedure: PERIPHERAL VASCULAR INTERVENTION;  Surgeon: Marty Heck, MD;  Location: [facility];  Service: Cardiovascular;  Laterality: Right;  SFA   TEE WITHOUT CARDIOVERSION N/A 12/15/2016   Procedure: TRANSESOPHAGEAL ECHOCARDIOGRAM (TEE);  Surgeon: Sherren Mocha, MD;  Location: [place];  Service: Open Heart Surgery;  Laterality: N/A;   TRANSCATHETER AORTIC VALVE REPLACEMENT, TRANSFEMORAL N/A 12/15/2016   Procedure: TRANSCATHETER AORTIC VALVE REPLACEMENT, TRANSFEMORAL;  Surgeon: Sherren Mocha, MD;  Location: [place];  Service: Open Heart Surgery;  Laterality: N/A;    Social History  reports that he has quit smoking. His smoking use included cigarettes. He has never used smokeless tobacco. He reports current alcohol use. He reports that he does not use drugs.  No Known Allergies  Family History  Problem Relation Age of Onset   Diabetes Mother    Heart disease Mother    Pulmonary embolism Father      Prior to Admission medications   Medication Sig  Start Date End Date Taking? Authorizing Provider  amLODipine (NORVASC) 10 MG tablet TAKE 1 TABLET BY MOUTH EVERY DAY Patient taking differently: Take 10 mg by mouth daily. 11/11/20   [name], MD  aspirin 81 MG EC tablet Take 81 mg by mouth daily. Swallow whole.    [provider]  carvedilol (COREG) 3.125 MG tablet TAKE 1 TABLET (3.125 MG TOTAL) BY MOUTH 2 (TWO) TIMES DAILY. Patient taking differently: Take 3.125 mg by mouth 2 (two) times daily with a meal. 02/07/21   [name], MD  Cholecalciferol (VITAMIN D) 50 MCG (2000 UT) tablet Take 2,000 Units by mouth daily.    [provider]  clopidogrel (PLAVIX) 75 MG tablet Take 1 tablet (75 mg total) by mouth daily. 04/14/21   Terrilee Croak, MD  ferrous sulfate 325 (65 FE) MG tablet Take 325 mg by mouth daily  with breakfast.    [provider]  finasteride (PROSCAR) 5 MG tablet Take 5 mg by mouth daily. 05/21/20   [provider]  furosemide (LASIX) 40 MG tablet TAKE 1.5 TABLETS DAILY. Patient taking differently: Take 60 mg by mouth daily. 01/01/21   Kathlen Mody, Scott T, PA-C  JARDIANCE 25 MG TABS tablet Take 25 mg by mouth daily. 12/06/19   [provider]  LOVAZA 1 g capsule TAKE 1 CAPSULE BY MOUTH EVERY DAY Patient taking differently: Take 1 g by mouth daily. 01/27/21   [name], MD  Multiple Vitamin (MULTIVITAMIN WITH MINERALS) TABS tablet Take 1 tablet by mouth daily. 10/27/15   Dana Allan I, MD  naproxen sodium (ALEVE) 220 MG tablet Take 220 mg by mouth daily as needed (pain).    [provider]  nitroGLYCERIN (NITRODUR - DOSED IN MG/24 HR) 0.2 mg/hr patch Place 1 patch (0.2 mg total) onto the skin daily. Patient not taking: Reported on 03/13/2021 11/21/20   [name], MD  pentoxifylline (TRENTAL) 400 MG CR tablet TAKE 1 TABLET BY MOUTH 3 TIMES DAILY WITH MEALS. Patient not taking: Reported on 03/13/2021 02/18/21   Suzan Slick, NP  rosuvastatin (CRESTOR) 20 MG tablet Take 20 mg by mouth daily. 12/06/19   [provider]  sacubitril-valsartan (ENTRESTO) 49-51 MG Take 1 tablet by mouth 2 (two) times daily. 01/13/21   [name], MD  tamsulosin (FLOMAX) 0.4 MG CAPS capsule Take 0.4 mg by mouth daily. 05/21/20   [provider]  ticagrelor (BRILINTA) 90 MG TABS tablet Take 1 tablet (90 mg total) by mouth 2 (two) times daily. 03/13/21 04/12/21  Terrilee Croak, MD  VITAMIN B COMPLEX-C PO Take 1 tablet by mouth daily.    [provider]    Physical Exam: Vitals:   03/16/21 1730 03/16/21 1745 03/16/21 1800 03/16/21 1815  BP: 127/84 123/69 129/62 128/60  Pulse: 84 (!) 49 (!) 52 (!) 56  Resp: 20 (!) 22 18 (!) 21  Temp:      SpO2: 98% 96% 98% 99%    Constitutional: NAD, calm, comfortable Vitals:   03/16/21 1730 03/16/21  1745 03/16/21 1800 03/16/21 1815  BP: 127/84 123/69 129/62 128/60  Pulse: 84 (!) 49 (!) 52 (!) 56  Resp: 20 (!) 22 18 (!) 21  Temp:      SpO2: 98% 96% 98% 99%   General: WDWN, Alert and oriented x3.  Eyes: EOMI, PERRL, conjunctivae normal.  Sclera nonicteric. No nystagmus HENT:  /AT, external ears normal.  Nares patent without epistasis.  Mucous membranes are moist. Posterior pharynx  clear of any exudate or lesions. Tongue midline with normal extension. Neck: Soft, normal range of motion, supple, no masses, Trachea midline Respiratory: clear to auscultation bilaterally, no wheezing, no crackles. Normal respiratory effort. No accessory muscle use.  Cardiovascular: Regular rate and rhythm, no murmurs / rubs / gallops. No extremity edema. 2+ pedal pulses. No carotid bruits.  Abdomen: Soft, no tenderness, nondistended, no rebound or guarding.  No masses palpated. Bowel sounds normoactive Musculoskeletal: FROM. no cyanosis. No joint deformity upper and lower extremities. Normal muscle tone.  Skin: Warm, dry, intact no rashes, lesions, ulcers. No induration Neurologic: CN 2-12 grossly intact.  Normal speech.  Sensation intact, patella DTR +2 bilaterally. Strength 5/5 in all extremities. No pronator drift. No deviation of tongue.  Psychiatric: Normal judgment and insight.  Normal mood.    Labs on Admission: I have personally reviewed following labs and imaging studies  CBC: Recent Labs  Lab 03/12/21 2044 03/12/21 2050 03/16/21 1623 03/16/21 1627  WBC 5.7  --  6.0  --   NEUTROABS 3.0  --  3.3  --   HGB 13.4 14.6 13.6 15.0  HCT 41.7 43.0 43.7 44.0  MCV 92.3  --  91.8  --   PLT PLATELET CLUMPS NOTED ON SMEAR, UNABLE TO ESTIMATE  --  PLATELET CLUMPS NOTED ON SMEAR, UNABLE TO ESTIMATE  --     Basic Metabolic Panel: Recent Labs  Lab 03/12/21 2044 03/12/21 2050 03/16/21 1623 03/16/21 1627  NA 136 142 136 138  K 3.9 4.0 3.5 3.5  CL 108 108 102 103  CO2 23  --  24  --   GLUCOSE 157*  150* 124* 122*  BUN 27* 31* 31* 34*  CREATININE 1.89* 1.80* 1.79* 1.80*  CALCIUM 9.0  --  8.9  --     GFR: Estimated Creatinine Clearance: 37.3 mL/min (A) (by C-G formula based on SCr of 1.8 mg/dL (H)).  Liver Function Tests: Recent Labs  Lab 03/12/21 2044 03/16/21 1623  AST 31 34  ALT 19 20  ALKPHOS 48 44  BILITOT 0.5 0.8  PROT 7.1 7.5  ALBUMIN 3.7 3.6    Urine analysis:    Component Value Date/Time   COLORURINE YELLOW 03/16/2021 1714   APPEARANCEUR CLEAR 03/16/2021 1714   LABSPEC 1.010 03/16/2021 1714   PHURINE 5.0 03/16/2021 1714   GLUCOSEU >=500 (A) 03/16/2021 1714   HGBUR TRACE (A) 03/16/2021 1714   BILIRUBINUR NEGATIVE 03/16/2021 1714   [place] 03/16/2021 1714   PROTEINUR NEGATIVE 03/16/2021 1714   NITRITE POSITIVE (A) 03/16/2021 1714   LEUKOCYTESUR SMALL (A) 03/16/2021 1714    Radiological Exams on Admission: DG Chest Port 1 View  Result Date: 03/16/2021 CLINICAL DATA:  Shortness of breath. EXAM: PORTABLE CHEST 1 VIEW COMPARISON:  Chest x-ray 03/07/2018. FINDINGS: The heart size and mediastinal contours are within normal limits. Both lungs are clear. The visualized skeletal structures are unremarkable. IMPRESSION: No active disease. Electronically Signed   By: Ronney Asters M.D.   On: 03/16/2021 18:00   CT HEAD CODE STROKE WO CONTRAST  Result Date: 03/16/2021 CLINICAL DATA:  Code stroke.  Slurred speech EXAM: CT HEAD WITHOUT CONTRAST TECHNIQUE: Contiguous axial images were obtained from the base of the skull through the vertex without intravenous contrast. COMPARISON:  CT head 03/12/2021, brain MRI 03/13/2021 FINDINGS: Brain: There is no evidence of acute intracranial hemorrhage, extra-axial fluid collection, or acute infarct. There is mild global parenchymal volume loss with commensurate enlargement of the ventricular system, unchanged. Confluent hypodensity in  the subcortical and periventricular white matter likely reflects sequela of chronic white  matter microangiopathy. 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 There is no solid mass lesion.  There is no midline shift. Vascular: There is calcification of the bilateral cavernous ICAs. Skull: Normal. Negative for fracture or focal lesion. Sinuses/Orbits: The imaged paranasal sinuses are clear. The globes and orbits are unremarkable. Other: None. ASPECTS [facility] Stroke Program Early CT Score) - Ganglionic level infarction (caudate, lentiform nuclei, internal capsule, insula, M1-M3 cortex): 7 - Supraganglionic infarction (M4-M6 cortex): 3 Total score (0-10 with 10 being normal): 10 IMPRESSION: 1. No acute intracranial pathology. 2. ASPECTS is 10 These results were paged via AMION at the time of interpretation on 03/16/2021 at 4:42 pm to provider Dr Rory Percy. Electronically Signed   By: Valetta Mole M.D.   On: 03/16/2021 16:43   CT ANGIO HEAD NECK W WO CM W PERF (CODE STROKE)  Result Date: 03/16/2021 CLINICAL DATA:  Neuro deficit, stroke suspected EXAM: CT ANGIOGRAPHY HEAD AND NECK TECHNIQUE: Multidetector CT imaging of the head and neck was performed using the standard protocol during bolus administration of intravenous contrast. Multiplanar CT image reconstructions and MIPs were obtained to evaluate the vascular anatomy. Carotid stenosis measurements (when applicable) are obtained utilizing NASCET criteria, using the distal internal carotid diameter as the denominator. CONTRAST:  85mL OMNIPAQUE IOHEXOL 350 MG/ML SOLN COMPARISON:  Same-day noncontrast CT head, MRA head 03/13/2021 FINDINGS: CTA NECK FINDINGS Aortic arch: There is calcified atherosclerotic plaque in the imaged aortic arch without evidence of hemodynamically significant stenosis or dissection to the level imaged. The origins of the major branch vessels are patent. Right carotid system: The right common, internal, and external carotid arteries are patent, without hemodynamically significant  stenosis, occlusion, dissection, or aneurysm. There is a medialized course of the right internal carotid artery. Left carotid system: The left common, internal, and external carotid arteries are patent, without hemodynamically significant stenosis, occlusion, dissection, or aneurysm. There is a medialized course of the left internal carotid artery. Vertebral arteries: Vertebral arteries are patent, without hemodynamically significant stenosis, occlusion, dissection, or aneurysm. Skeleton: There is multilevel degenerative change of the cervical spine, most advanced at C3-C4 through C5-C6. There is no visible canal hematoma. There is no acute osseous abnormality or aggressive osseous lesion. Other neck: The soft tissues are unremarkable. Upper chest: There is a 4 mm nodule in the left lung apex (7-278). Review of the MIP images confirms the above findings CTA HEAD FINDINGS Anterior circulation: There is calcified atherosclerotic plaque in the bilateral cavernous ICAs resulting in up to mild stenosis bilaterally. There is short-segment occlusion of a proximal right M2 branch with distal reconstitution, unchanged compared to the recent prior MRA (8-84, 11-19). The distal right MCA branches are patent. The left MCA and branches are patent. The bilateral ACAs are patent. There is no aneurysm. Posterior circulation: The bilateral V4 segments are patent. The basilar artery is patent. The bilateral PCAs are patent.  There is a fetal PCA on the left. There is no aneurysm. Venous sinuses: As permitted by contrast timing, patent. Anatomic variants: As above. Review of the MIP images confirms the above findings IMPRESSION: 1. 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 2. Mild calcified atherosclerotic plaque in the bilateral intracranial ICAs without hemodynamically significant stenosis or occlusion. Otherwise, patent intracranial vasculature. 3. Patent vasculature of the neck with no  significant stenosis, occlusion, dissection, or aneurysm. 4.  4 mm left solid pulmonary nodule in the left apex. If patient is low risk for malignancy, no routine follow-up imaging is recommended; if patient is high risk for malignancy, a non-contrast Chest CT at 12 months is optional. If performed and the nodule is stable at 12 months, no further follow-up is recommended. These guidelines do not apply to immunocompromised patients and patients with cancer. Follow up in patients with significant comorbidities as clinically warranted. For lung cancer screening, adhere to Lung-RADS guidelines. Reference: Radiology. 2017; 284(1):228-43. Electronically Signed   By: Valetta Mole M.D.   On: 03/16/2021 17:13    EKG: Independently reviewed.  EKG shows normal sinus rhythm with occasional PVCs.  No acute ST elevation or depression.  QTc 471  Assessment/Plan Principal Problem:   TIA (transient ischemic attack) Patient be observed on medical telemetry floor for TIA.  Will obtain MRI of brain to rule out acute ischemic CVA per neurology recommendation to repeat this test Echo done on 03/13/21 showed EF 45-50% with LV global hypokinesis Hypertension of 220/110 will be allowed for 24 hours per stroke protocol.  After which blood pressure will be slowly reduced to goal level. Antiplatelet therapy with aspirin and Brilenta for the next month then will change to Brilenta monotherapy Continue statin therapy.  Check lipid panel.  Neurochecks per stroke protocol  Active Problems:    Covid 19 Infection Pt has positive Covid test today. He has a mild dry cough but no hypoxia and not requiring oxygen.  Covid infection can lead to cardiac damage. Initial troponin was elevated at 73. Track serial troponin levels.  If MRI head negative will need to be put on heparin to decrease thrombotic burden of Covid.  Will treat with paxlovid vs Molpunivinir for COVID.    CKD (chronic kidney disease), stage III  Stable. Monitor  electrolytes.     Essential hypertension Continue Coreg, entresto    Diabetes mellitus type 2, controlled  Continue Jardiance. Had HgbA1c a few days ago which was 6.9    Chronic combined systolic and diastolic heart failure Continue Coreg and Entresto. Continue lasix.       Asymptomatic bacturia Pt was given rocephin in the ER. Urine culture sent. No urinary symptoms per patient. Given omnicef to cover for positive UA    DVT prophylaxis: SCD for DVT prophylaxis.   Code Status:   Full Code  Family Communication:  Diagnosis and plan discussed with patient.  Patient verbalized understanding agrees with plan further recommendations to follow as clinical indicated Disposition Plan:   Patient is from:  Home  Anticipated DC to:  Home  Anticipated DC date:  Anticipate less than 2 midnight stay   Consults called:  Neurology  Admission status:  Observation  Eben Burow MD [facility]  How to contact the [facility] Attending or Consulting provider [place] or covering provider during after hours [place], for this patient?   Check the care team in [facility] and look for a) attending/consulting [facility] provider listed and b) the [facility] team listed Log into [email or web address] and use 's universal password to access. If you do not have the password, please contact the hospital operator. Locate the [facility] provider you are looking for under [facility] and page to a number that you can be directly reached. If you still have difficulty reaching the provider, please page the [facility] (Director on Call) for the Hospitalists listed on amion for assistance.  03/16/2021, 7:41 PM

## 2021-03-16 NOTE — ED Provider Notes (Signed)
[facility] EMERGENCY DEPARTMENT Provider Note   CSN: [id number] Arrival date & time: 03/16/21  1541     History Chief Complaint  Patient presents with   Code Stroke    Robert Horn is a 87 y.o. male.  Patient with hx chf, recent tia, c/o general weakness in past 1-2 days. Denies focal or unilateral numbness or weakness. Spouse indicates sleep sounded mildly slurred earlier, but now at baseline. No change in vision. No problems w balance, gait or normal functional ability. No headache. Pt notes non prod cough, nasal congestion. No sore throat or trouble swallowing. +poor appetite, poor po intake past couple days. Compliant w home meds. No known fevers. No chills/sweats. No specific known ill contacts. Denies abd pain or nvd. No dysuria or gu c/o. No leg pain or swelling.   The history is provided by the patient, the spouse and medical records.      Past Medical History:  Diagnosis Date   Anemia    Anxiety    situational- surgery   Asthma    as a child   Cancer ([place]) 2019   colon- colectomy    CHF (congestive heart failure) ([place])    Chronic kidney disease    followed by Dr. Delfina Redwood   Coronary artery disease    Diabetes mellitus without complication ([place])    Type II   Dyslipidemia 10/27/2015   Dyspnea    w/ exertion    Elevated PSA 10/27/2015   Erectile dysfunction 10/27/2015   Heart murmur    Hypertension 10/27/2015   Hypogonadism male 10/27/2015   Neuropathy    Obesity 10/27/2015   Peripheral vascular disease ([place])    Pneumonia 10/27/2015   pt states was 1982   Rotator cuff tear 10/27/2015    Patient Active Problem List   Diagnosis Date Noted   TIA (transient ischemic attack) 03/12/2021   Diabetes mellitus type 2, controlled ([place]) 03/12/2021   Right foot ulcer, limited to breakdown of skin ([place]) 02/28/2020   Osteomyelitis of second toe of right foot ([place])    Osteomyelitis of great toe of right foot (H[facility])    Severe protein-calorie  malnutrition ([place])    Diabetic polyneuropathy associated with type 2 diabetes mellitus ([place])    Cutaneous abscess of right foot    Cellulitis of right foot 01/30/2019   CKD (chronic kidney disease), stage III ([place]) 01/30/2019   Elevated LFTs 01/30/2019   Colon cancer ([place]) 01/30/2019   Anemia of chronic disease 01/30/2019   Acute on chronic combined systolic and diastolic CHF (congestive heart failure) ([place]) 03/09/2018   AKI (acute kidney injury) ([place]) 03/09/2018   Colonic mass 12/10/2017   S/P TAVR (transcatheter aortic valve replacement) 03/03/2017   Severe aortic stenosis 12/15/2016   Essential hypertension 05/09/2013   Hyperlipidemia 05/09/2013   PVC's (premature ventricular contractions) 05/09/2013   Chronic combined systolic and diastolic heart failure ([place]) 05/09/2013    Past Surgical History:  Procedure Laterality Date   ABDOMINAL AORTOGRAM N/A 02/01/2019   Procedure: ABDOMINAL AORTOGRAM;  Surgeon: Marty Heck, MD;  Location: [place] [facility];  Service: Cardiovascular;  Laterality: N/A;   AMPUTATION Right 02/03/2019   Procedure: RIGHT FOOT 1ST RAY AMPUTATION;  Surgeon: [name], MD;  Location: [place];  Service: Orthopedics;  Laterality: Right;   AMPUTATION Right 08/02/2019   Procedure: RIGHT 2ND TOE AMPUTATION;  Surgeon: [name], MD;  Location: [place];  Service: Orthopedics;  Laterality: Right;   AMPUTATION Right 11/08/2020  Procedure: RIGHT TRANSMETATARSAL AMPUTATION;  Surgeon: [name], MD;  Location: [place];  Service: Orthopedics;  Laterality: Right;   APPLICATION OF WOUND VAC  11/08/2020   Procedure: APPLICATION OF WOUND VAC;  Surgeon: [name], MD;  Location: [place];  Service: Orthopedics;;   CARDIAC CATHETERIZATION N/A 11/14/2015   Procedure: Left Heart Cath and Coronary Angiography;  Surgeon: [name], MD;  Location: [place] [facility];  Service: Cardiovascular;  Laterality: N/A;   COLONOSCOPY     LAPAROSCOPIC PARTIAL COLECTOMY   12/15/2017   LAPAROSCOPIC PARTIAL COLECTOMY (N/A Abdomen)   LAPAROSCOPIC PARTIAL COLECTOMY N/A 12/15/2017   Procedure: LAPAROSCOPIC PARTIAL COLECTOMY;  Surgeon: Erroll Luna, MD;  Location: [place];  Service: General;  Laterality: N/A;   LOWER EXTREMITY ANGIOGRAPHY Right 02/01/2019   Procedure: LOWER EXTREMITY ANGIOGRAPHY;  Surgeon: Marty Heck, MD;  Location: [place] [facility];  Service: Cardiovascular;  Laterality: Right;   PERIPHERAL VASCULAR INTERVENTION Right 02/01/2019   Procedure: PERIPHERAL VASCULAR INTERVENTION;  Surgeon: Marty Heck, MD;  Location: [place] [facility];  Service: Cardiovascular;  Laterality: Right;  SFA   TEE WITHOUT CARDIOVERSION N/A 12/15/2016   Procedure: TRANSESOPHAGEAL ECHOCARDIOGRAM (TEE);  Surgeon: Sherren Mocha, MD;  Location: [place];  Service: Open Heart Surgery;  Laterality: N/A;   TRANSCATHETER AORTIC VALVE REPLACEMENT, TRANSFEMORAL N/A 12/15/2016   Procedure: TRANSCATHETER AORTIC VALVE REPLACEMENT, TRANSFEMORAL;  Surgeon: Sherren Mocha, MD;  Location: [place];  Service: Open Heart Surgery;  Laterality: N/A;       Family History  Problem Relation Age of Onset   Diabetes Mother    Heart disease Mother    Pulmonary embolism Father     Social History   Tobacco Use   Smoking status: Former    Types: Cigarettes   Smokeless tobacco: Never  Vaping Use   Vaping Use: Never used  Substance Use Topics   Alcohol use: Yes    Comment: rarely   Drug use: No    Home Medications Prior to Admission medications   Medication Sig Start Date End Date Taking? Authorizing Provider  amLODipine (NORVASC) 10 MG tablet TAKE 1 TABLET BY MOUTH EVERY DAY Patient taking differently: Take 10 mg by mouth daily. 11/11/20   [name], MD  aspirin 81 MG EC tablet Take 81 mg by mouth daily. Swallow whole.    [provider]  carvedilol (COREG) 3.125 MG tablet TAKE 1 TABLET (3.125 MG TOTAL) BY MOUTH 2 (TWO) TIMES DAILY. Patient taking differently:  Take 3.125 mg by mouth 2 (two) times daily with a meal. 02/07/21   [name], MD  Cholecalciferol (VITAMIN D) 50 MCG (2000 UT) tablet Take 2,000 Units by mouth daily.    [provider]  clopidogrel (PLAVIX) 75 MG tablet Take 1 tablet (75 mg total) by mouth daily. 04/14/21   Terrilee Croak, MD  ferrous sulfate 325 (65 FE) MG tablet Take 325 mg by mouth daily with breakfast.    [provider]  finasteride (PROSCAR) 5 MG tablet Take 5 mg by mouth daily. 05/21/20   [provider]  furosemide (LASIX) 40 MG tablet TAKE 1.5 TABLETS DAILY. Patient taking differently: Take 60 mg by mouth daily. 01/01/21   Kathlen Mody, Scott T, PA-C  JARDIANCE 25 MG TABS tablet Take 25 mg by mouth daily. 12/06/19   [provider]  LOVAZA 1 g capsule TAKE 1 CAPSULE BY MOUTH EVERY DAY Patient taking differently: Take 1 g by mouth daily. 01/27/21  [name], MD  Multiple Vitamin (MULTIVITAMIN WITH MINERALS) TABS tablet Take 1 tablet by mouth daily. 10/27/15   Dana Allan I, MD  naproxen sodium (ALEVE) 220 MG tablet Take 220 mg by mouth daily as needed (pain).    [provider]  nitroGLYCERIN (NITRODUR - DOSED IN MG/24 HR) 0.2 mg/hr patch Place 1 patch (0.2 mg total) onto the skin daily. Patient not taking: Reported on 03/13/2021 11/21/20   [name], MD  pentoxifylline (TRENTAL) 400 MG CR tablet TAKE 1 TABLET BY MOUTH 3 TIMES DAILY WITH MEALS. Patient not taking: Reported on 03/13/2021 02/18/21   Suzan Slick, NP  rosuvastatin (CRESTOR) 20 MG tablet Take 20 mg by mouth daily. 12/06/19   [provider]  sacubitril-valsartan (ENTRESTO) 49-51 MG Take 1 tablet by mouth 2 (two) times daily. 01/13/21   [name], MD  tamsulosin (FLOMAX) 0.4 MG CAPS capsule Take 0.4 mg by mouth daily. 05/21/20   [provider]  ticagrelor (BRILINTA) 90 MG TABS tablet Take 1 tablet (90 mg total) by mouth 2 (two) times daily. 03/13/21 04/12/21  Terrilee Croak, MD  VITAMIN  B COMPLEX-C PO Take 1 tablet by mouth daily.    [provider]    Allergies    Patient has no known allergies.  Review of Systems   Review of Systems  Constitutional:  Negative for chills and fever.  HENT:  Positive for congestion. Negative for sore throat.   Eyes:  Negative for redness and visual disturbance.  Respiratory:  Positive for cough. Negative for shortness of breath.   Cardiovascular:  Negative for chest pain, palpitations and leg swelling.  Gastrointestinal:  Negative for abdominal pain, diarrhea and vomiting.  Genitourinary:  Negative for dysuria and flank pain.  Musculoskeletal:  Negative for back pain and neck pain.  Skin:  Negative for rash.  Neurological:  Negative for numbness and headaches.  Hematological:  Does not bruise/bleed easily.  Psychiatric/Behavioral:  Negative for confusion.    Physical Exam Updated Vital Signs BP (!) 107/96    Pulse (!) 58    Temp 98.4 F (36.9 C)    Resp (!) 21    SpO2 98%   Physical Exam Vitals and nursing note reviewed.  Constitutional:      Appearance: Normal appearance. He is well-developed.  HENT:     Head: Atraumatic.     Nose: Nose normal.     Mouth/Throat:     Mouth: Mucous membranes are moist.     Pharynx: Oropharynx is clear.  Eyes:     General: No scleral icterus.    Conjunctiva/sclera: Conjunctivae normal.     Pupils: Pupils are equal, round, and reactive to light.  Neck:     Vascular: No carotid bruit.     Trachea: No tracheal deviation.     Comments: No stiffness or rigidity.  Cardiovascular:     Rate and Rhythm: Normal rate and regular rhythm.     Pulses: Normal pulses.     Heart sounds: Normal heart sounds. No murmur heard.   No friction rub. No gallop.  Pulmonary:     Effort: Pulmonary effort is normal. No accessory muscle usage or respiratory distress.     Breath sounds: Normal breath sounds.  Abdominal:     General: Bowel sounds are normal. There is no distension.     Palpations:  Abdomen is soft.     Tenderness: There is no abdominal tenderness. There is no guarding.  Genitourinary:  Comments: No cva tenderness. Musculoskeletal:        General: No swelling or tenderness.     Cervical back: Normal range of motion and neck supple. No rigidity.     Right lower leg: No edema.     Left lower leg: No edema.  Skin:    General: Skin is warm and dry.     Findings: No rash.  Neurological:     Mental Status: He is alert.     Cranial Nerves: No cranial nerve deficit.     Comments: Alert, speech clear. No dysarthria or aphasia noted. Motor intact bil, stre 5/5. Sens grossly intact. Steady gait.   Psychiatric:        Mood and Affect: Mood normal.    ED Results / Procedures / Treatments   Labs (all labs ordered are listed, but only abnormal results are displayed) Results for orders placed or performed during the hospital encounter of 03/16/21  Protime-INR  Result Value Ref Range   Prothrombin Time 13.3 11.4 - 15.2 seconds   INR 1.0 0.8 - 1.2  APTT  Result Value Ref Range   aPTT 31 24 - 36 seconds  CBC  Result Value Ref Range   WBC 6.0 4.0 - 10.5 K/uL   RBC 4.76 4.22 - 5.81 MIL/uL   Hemoglobin 13.6 13.0 - 17.0 g/dL   HCT 43.7 39.0 - 52.0 %   MCV 91.8 80.0 - 100.0 fL   MCH 28.6 26.0 - 34.0 pg   MCHC 31.1 30.0 - 36.0 g/dL   RDW 14.5 11.5 - 15.5 %   Platelets PLATELET CLUMPS NOTED ON SMEAR, UNABLE TO ESTIMATE 150 - 400 K/uL   nRBC 0.0 0.0 - 0.2 %  Differential  Result Value Ref Range   Neutrophils Relative % 53 %   Neutro Abs 3.3 1.7 - 7.7 K/uL   Lymphocytes Relative 19 %   Lymphs Abs 1.1 0.7 - 4.0 K/uL   Monocytes Relative 26 %   Monocytes Absolute 1.6 (H) 0.1 - 1.0 K/uL   Eosinophils Relative 1 %   Eosinophils Absolute 0.0 0.0 - 0.5 K/uL   Basophils Relative 1 %   Basophils Absolute 0.0 0.0 - 0.1 K/uL   Immature Granulocytes 0 %   Abs Immature Granulocytes 0.02 0.00 - 0.07 K/uL  Comprehensive metabolic panel  Result Value Ref Range   Sodium 136 135  - 145 mmol/L   Potassium 3.5 3.5 - 5.1 mmol/L   Chloride 102 98 - 111 mmol/L   CO2 24 22 - 32 mmol/L   Glucose, Bld 124 (H) 70 - 99 mg/dL   BUN 31 (H) 8 - 23 mg/dL   Creatinine, Ser 1.79 (H) 0.61 - 1.24 mg/dL   Calcium 8.9 8.9 - 10.3 mg/dL   Total Protein 7.5 6.5 - 8.1 g/dL   Albumin 3.6 3.5 - 5.0 g/dL   AST 34 15 - 41 U/L   ALT 20 0 - 44 U/L   Alkaline Phosphatase 44 38 - 126 U/L   Total Bilirubin 0.8 0.3 - 1.2 mg/dL   GFR, Estimated 37 (L) >60 mL/min   Anion gap 10 5 - 15  Brain natriuretic peptide  Result Value Ref Range   B Natriuretic Peptide 207.8 (H) 0.0 - 100.0 pg/mL  Urinalysis, Routine w reflex microscopic  Result Value Ref Range   Color, Urine YELLOW YELLOW   APPearance CLEAR CLEAR   Specific Gravity, Urine 1.010 1.005 - 1.030   pH 5.0 5.0 - 8.0  Glucose, UA >=500 (A) NEGATIVE mg/dL   Hgb urine dipstick TRACE (A) NEGATIVE   Bilirubin Urine NEGATIVE NEGATIVE   Ketones, ur NEGATIVE NEGATIVE mg/dL   Protein, ur NEGATIVE NEGATIVE mg/dL   Nitrite POSITIVE (A) NEGATIVE   Leukocytes,Ua SMALL (A) NEGATIVE  Urinalysis, Microscopic (reflex)  Result Value Ref Range   RBC / HPF 0-5 0 - 5 RBC/hpf   WBC, UA >50 0 - 5 WBC/hpf   Bacteria, UA RARE (A) NONE SEEN   Squamous Epithelial / LPF 0-5 0 - 5   Mucus PRESENT   CBG monitoring, ED  Result Value Ref Range   Glucose-Capillary 111 (H) 70 - 99 mg/dL  I-stat chem 8, ED  Result Value Ref Range   Sodium 138 135 - 145 mmol/L   Potassium 3.5 3.5 - 5.1 mmol/L   Chloride 103 98 - 111 mmol/L   BUN 34 (H) 8 - 23 mg/dL   Creatinine, Ser 1.80 (H) 0.61 - 1.24 mg/dL   Glucose, Bld 122 (H) 70 - 99 mg/dL   Calcium, Ion 1.11 (L) 1.15 - 1.40 mmol/L   TCO2 25 22 - 32 mmol/L   Hemoglobin 15.0 13.0 - 17.0 g/dL   HCT 44.0 39.0 - 52.0 %  Troponin I (High Sensitivity)  Result Value Ref Range   Troponin I (High Sensitivity) 73 (H) <18 ng/L   MR ANGIO HEAD WO CONTRAST  Result Date: 03/13/2021 CLINICAL DATA:  Transient ischemic attack  EXAM: MRI HEAD WITHOUT CONTRAST MRA HEAD WITHOUT CONTRAST TECHNIQUE: Multiplanar, multi-echo pulse sequences of the brain and surrounding structures were acquired without intravenous contrast. Angiographic images of the Circle of Willis were acquired using MRA technique without intravenous contrast. COMPARISON:  No prior MRI, correlation is made with CT head 03/12/2021 FINDINGS: MRI HEAD FINDINGS Brain: No restricted diffusion to suggest acute or subacute infarction. No acute hemorrhage, mass, mass effect, or midline shift. No hydrocephalus or extra-axial collection. Confluent T2 hyperintense signal in the periventricular white matter, likely the sequela of severe chronic small vessel ischemic disease. No foci of hemosiderin deposition to suggest remote hemorrhage. Generalized cerebral atrophy, without lobar predominance, likely within normal limits for age. Vascular: Normal flow voids.  Please see MRA findings below. Skull and upper cervical spine: Normal marrow signal. Degenerative changes in the cervical spine, with at least mild spinal canal stenosis at C3-C4. Sinuses/Orbits: No acute or significant finding. Other: The mastoids are well aerated. MRA HEAD FINDINGS Anterior circulation: Both internal carotid arteries are patent to the termini, without stenosis or other abnormality. A1 segments patent. Normal anterior communicating artery. Anterior cerebral arteries are patent to their distal aspects. No M1 stenosis or occlusion. At the right MCA bifurcation, there is focal non opacification of a segment of right M2 (series 5, images 69-77, which is likely chronic as there are multiple collateral vessels in the area. Focal narrowing of a left M2 branch proximally, just distal to the bifurcation (series 5, image 62); this is otherwise patent. Distal MCA branches perfused and symmetric. Posterior circulation: Vertebral arteries patent to the vertebrobasilar junction without stenosis. Posterior inferior cerebral  arteries patent bilaterally. Basilar patent to its distal aspect. Superior cerebellar arteries patent bilaterally. PCAs perfused to their distal aspects without stenosis. Near fetal origin of the left PCA, with a dominant left posterior communicating artery and a diminutive left P1. Normal right P1. The right posterior communicating artery is not visualized. Anatomic variants: None significant IMPRESSION: 1. No acute intracranial process. 2. Focal non opacification of a proximal M2  branch, with distal reconstitution. This may be chronic, as there are multiple small vessels in this area. 3. Focal stenosis at the origin of a left M2 branch, which is otherwise patent. Electronically Signed   By: Merilyn Baba M.D.   On: 03/13/2021 01:19   MR BRAIN WO CONTRAST  Result Date: 03/13/2021 CLINICAL DATA:  Transient ischemic attack EXAM: MRI HEAD WITHOUT CONTRAST MRA HEAD WITHOUT CONTRAST TECHNIQUE: Multiplanar, multi-echo pulse sequences of the brain and surrounding structures were acquired without intravenous contrast. Angiographic images of the Circle of Willis were acquired using MRA technique without intravenous contrast. COMPARISON:  No prior MRI, correlation is made with CT head 03/12/2021 FINDINGS: MRI HEAD FINDINGS Brain: No restricted diffusion to suggest acute or subacute infarction. No acute hemorrhage, mass, mass effect, or midline shift. No hydrocephalus or extra-axial collection. Confluent T2 hyperintense signal in the periventricular white matter, likely the sequela of severe chronic small vessel ischemic disease. No foci of hemosiderin deposition to suggest remote hemorrhage. Generalized cerebral atrophy, without lobar predominance, likely within normal limits for age. Vascular: Normal flow voids.  Please see MRA findings below. Skull and upper cervical spine: Normal marrow signal. Degenerative changes in the cervical spine, with at least mild spinal canal stenosis at C3-C4. Sinuses/Orbits: No acute or  significant finding. Other: The mastoids are well aerated. MRA HEAD FINDINGS Anterior circulation: Both internal carotid arteries are patent to the termini, without stenosis or other abnormality. A1 segments patent. Normal anterior communicating artery. Anterior cerebral arteries are patent to their distal aspects. No M1 stenosis or occlusion. At the right MCA bifurcation, there is focal non opacification of a segment of right M2 (series 5, images 69-77, which is likely chronic as there are multiple collateral vessels in the area. Focal narrowing of a left M2 branch proximally, just distal to the bifurcation (series 5, image 62); this is otherwise patent. Distal MCA branches perfused and symmetric. Posterior circulation: Vertebral arteries patent to the vertebrobasilar junction without stenosis. Posterior inferior cerebral arteries patent bilaterally. Basilar patent to its distal aspect. Superior cerebellar arteries patent bilaterally. PCAs perfused to their distal aspects without stenosis. Near fetal origin of the left PCA, with a dominant left posterior communicating artery and a diminutive left P1. Normal right P1. The right posterior communicating artery is not visualized. Anatomic variants: None significant IMPRESSION: 1. No acute intracranial process. 2. Focal non opacification of a proximal M2 branch, with distal reconstitution. This may be chronic, as there are multiple small vessels in this area. 3. Focal stenosis at the origin of a left M2 branch, which is otherwise patent. Electronically Signed   By: Merilyn Baba M.D.   On: 03/13/2021 01:19   VAS [place] ABI WITH/WO TBI  Result Date: 02/24/2021  LOWER EXTREMITY DOPPLER STUDY Patient Name:  OBINNA EHRESMAN  Date of Exam:   02/24/2021 Medical Rec #: [id number]      Accession #:    [id number] Date of Birth: 1934/01/04      Patient Gender: M Patient Age:   52 years Exam Location:  [facility] Procedure:      VAS [place] ABI WITH/WO TBI Referring  Phys: Dagoberto Ligas --------------------------------------------------------------------------------   Vascular Interventions: Right SFA stent on 02/01/19. Performing Technologist: Ralene Cork RVT  Examination Guidelines: A complete evaluation includes at minimum, Doppler waveform signals and systolic blood pressure reading at the level of bilateral brachial, anterior tibial, and posterior tibial arteries, when vessel segments are accessible. Bilateral testing is considered an integral part of a  complete examination. Photoelectric Plethysmograph (PPG) waveforms and toe systolic pressure readings are included as required and additional duplex testing as needed. Limited examinations for reoccurring indications may be performed as noted.  ABI Findings: +---------+------------------+-----+---------+--------+  Right     Rt Pressure (mmHg) Index Waveform  Comment   +---------+------------------+-----+---------+--------+  Brachial  123                                          +---------+------------------+-----+---------+--------+  PTA       152                1.06  triphasic           +---------+------------------+-----+---------+--------+  DP        154                1.07  biphasic            +---------+------------------+-----+---------+--------+  Great Toe                                    amp       +---------+------------------+-----+---------+--------+ +---------+------------------+-----+----------+-------+  Left      Lt Pressure (mmHg) Index Waveform   Comment  +---------+------------------+-----+----------+-------+  Brachial  144                                          +---------+------------------+-----+----------+-------+  PTA       102                0.71  monophasic          +---------+------------------+-----+----------+-------+  DP        90                 0.62  monophasic          +---------+------------------+-----+----------+-------+  Great Toe 69                 0.48                       +---------+------------------+-----+----------+-------+ +-------+-----------+-----------+------------+------------+  ABI/TBI Today's ABI Today's TBI Previous ABI Previous TBI  +-------+-----------+-----------+------------+------------+  Right   1.07                    0.95                       +-------+-----------+-----------+------------+------------+  Left    0.71        0.48        0.77         0.09          +-------+-----------+-----------+------------+------------+  Previous ABI on 08/13/20.  Summary: Right: Resting right ankle-brachial index is within normal range. No evidence of significant right lower extremity arterial disease. Left: Resting left ankle-brachial index indicates moderate left lower extremity arterial disease. The left toe-brachial index is abnormal.  *See table(s) above for measurements and observations.  Electronically signed by Harold Barban MD on 02/24/2021 at 6:30:27 PM.    Final    ECHOCARDIOGRAM COMPLETE  Result Date: 03/13/2021    ECHOCARDIOGRAM REPORT   Patient Name:   RENE SIZELOVE Date of Exam: 03/13/2021  Medical Rec #:  [id number]     Height:       72.0 in Accession #:    [id number]    Weight:       227.5 lb Date of Birth:  1933-05-15     BSA:          2.251 m Patient Age:    26 years      BP:           138/83 mmHg Patient Gender: M             HR:           53 bpm. Exam Location:  Inpatient Procedure: 2D Echo, Cardiac Doppler, Color Doppler and 3D Echo Indications:    TIA G45.9  History:        Patient has prior history of Echocardiogram examinations, most                 recent 02/01/2019. Aortic Valve Disease, Arrythmias:Bradycardia;                 Risk Factors:Diabetes and Cancer.  Sonographer:    Merrie Roof RDCS Referring Phys: [id number] [place]  1. Left ventricular ejection fraction, by estimation, is 45 to 50%. The left ventricle has mildly decreased function. The left ventricle demonstrates global hypokinesis. The left ventricular internal cavity  size was mildly dilated. Left ventricular diastolic parameters are consistent with Grade I diastolic dysfunction (impaired relaxation).  2. Right ventricular systolic function is normal. The right ventricular size is normal. There is normal pulmonary artery systolic pressure.  3. Left atrial size was severely dilated.  4. The mitral valve is normal in structure. Trivial mitral valve regurgitation. No evidence of mitral stenosis.  5. Aortic valve gradients are moderately elevated, unchanged from 01/2019. Deceleration time 81 msec, consistent with a normally funcitoning prosthetic valve. The aortic valve has been repaired/replaced. Aortic valve regurgitation is not visualized. No aortic stenosis is present. Aortic valve area, by VTI measures 0.52 cm. Aortic valve mean gradient measures 24.5 mmHg. Aortic valve Vmax measures 3.26 m/s.  6. The inferior vena cava is normal in size with greater than 50% respiratory variability, suggesting right atrial pressure of 3 mmHg. FINDINGS  Left Ventricle: Left ventricular ejection fraction, by estimation, is 45 to 50%. The left ventricle has mildly decreased function. The left ventricle demonstrates global hypokinesis. The left ventricular internal cavity size was mildly dilated. There is  no left ventricular hypertrophy. Left ventricular diastolic parameters are consistent with Grade I diastolic dysfunction (impaired relaxation). Right Ventricle: The right ventricular size is normal. No increase in right ventricular wall thickness. Right ventricular systolic function is normal. There is normal pulmonary artery systolic pressure. The tricuspid regurgitant velocity is 2.38 m/s, and  with an assumed right atrial pressure of 3 mmHg, the estimated right ventricular systolic pressure is 68.1 mmHg. Left Atrium: Left atrial size was severely dilated. Right Atrium: Right atrial size was normal in size. Pericardium: There is no evidence of pericardial effusion. Mitral Valve: The mitral  valve is normal in structure. Trivial mitral valve regurgitation. No evidence of mitral valve stenosis. Tricuspid Valve: The tricuspid valve is normal in structure. Tricuspid valve regurgitation is trivial. No evidence of tricuspid stenosis. Aortic Valve: Aortic valve gradients are moderately elevated, unchanged from 01/2019. Deceleration time 81 msec, consistent with a normally funcitoning prosthetic valve. The aortic valve has been repaired/replaced. Aortic valve regurgitation is not visualized. No aortic stenosis is present. Aortic valve mean gradient  measures 24.5 mmHg. Aortic valve peak gradient measures 42.5 mmHg. Aortic valve area, by VTI measures 0.52 cm. There is a 26 mm Sapien prosthetic, stented (TAVR) valve present in the aortic position. Pulmonic Valve: The pulmonic valve was normal in structure. Pulmonic valve regurgitation is not visualized. No evidence of pulmonic stenosis. Aorta: The aortic root is normal in size and structure. Venous: The inferior vena cava is normal in size with greater than 50% respiratory variability, suggesting right atrial pressure of 3 mmHg. IAS/Shunts: No atrial level shunt detected by color flow Doppler.  LEFT VENTRICLE PLAX 2D LVIDd:         5.70 cm      Diastology LVIDs:         4.30 cm      LV e' medial:    3.81 cm/s LV PW:         0.90 cm      LV E/e' medial:  14.1 LV IVS:        1.00 cm      LV e' lateral:   3.70 cm/s LVOT diam:     2.00 cm      LV E/e' lateral: 14.5 LV SV:         43 LV SV Index:   19 LVOT Area:     3.14 cm                              3D Volume EF: LV Volumes (MOD)            3D EF:        51 % LV vol d, MOD A2C: 149.0 ml LV EDV:       238 ml LV vol d, MOD A4C: 135.0 ml LV ESV:       116 ml LV vol s, MOD A2C: 70.5 ml  LV SV:        122 ml LV vol s, MOD A4C: 79.6 ml LV SV MOD A2C:     78.5 ml LV SV MOD A4C:     135.0 ml LV SV MOD BP:      66.8 ml RIGHT VENTRICLE RV Basal diam:  5.10 cm RV Mid diam:    4.40 cm LEFT ATRIUM              Index         RIGHT ATRIUM           Index LA diam:        4.50 cm  2.00 cm/m   RA Area:     22.20 cm LA Vol (A2C):   200.0 ml 88.87 ml/m  RA Volume:   61.90 ml  27.50 ml/m LA Vol (A4C):   182.0 ml 80.87 ml/m LA Biplane Vol: 197.0 ml 87.53 ml/m  AORTIC VALVE AV Area (Vmax):    0.55 cm AV Area (Vmean):   0.54 cm AV Area (VTI):     0.52 cm AV Vmax:           326.00 cm/s AV Vmean:          233.500 cm/s AV VTI:            0.822 m AV Peak Grad:      42.5 mmHg AV Mean Grad:      24.5 mmHg LVOT Vmax:         57.40 cm/s LVOT Vmean:        40.100 cm/s  LVOT VTI:          0.136 m LVOT/AV VTI ratio: 0.17  AORTA Ao Root diam: 2.80 cm Ao Asc diam:  3.40 cm MITRAL VALVE               TRICUSPID VALVE MV Area (PHT): 3.02 cm    TR Peak grad:   22.7 mmHg MV Decel Time: 251 msec    TR Vmax:        238.00 cm/s MV E velocity: 53.60 cm/s MV A velocity: 95.50 cm/s  SHUNTS MV E/A ratio:  0.56        Systemic VTI:  0.14 m                            Systemic Diam: 2.00 cm Skeet Latch MD Electronically signed by Skeet Latch MD Signature Date/Time: 03/13/2021/4:24:18 PM    Final    CT HEAD CODE STROKE WO CONTRAST  Result Date: 03/16/2021 CLINICAL DATA:  Code stroke.  Slurred speech EXAM: CT HEAD WITHOUT CONTRAST TECHNIQUE: Contiguous axial images were obtained from the base of the skull through the vertex without intravenous contrast. COMPARISON:  CT head 03/12/2021, brain MRI 03/13/2021 FINDINGS: Brain: There is no evidence of acute intracranial hemorrhage, extra-axial fluid collection, or acute infarct. There is mild global parenchymal volume loss with commensurate enlargement of the ventricular system, unchanged. Confluent hypodensity in the subcortical and periventricular white matter likely reflects sequela of chronic white matter microangiopathy. 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 There is no solid mass lesion.  There is no midline shift. Vascular: There is calcification  of the bilateral cavernous ICAs. Skull: Normal. Negative for fracture or focal lesion. Sinuses/Orbits: The imaged paranasal sinuses are clear. The globes and orbits are unremarkable. Other: None. ASPECTS [facility] Stroke Program Early CT Score) - Ganglionic level infarction (caudate, lentiform nuclei, internal capsule, insula, M1-M3 cortex): 7 - Supraganglionic infarction (M4-M6 cortex): 3 Total score (0-10 with 10 being normal): 10 IMPRESSION: 1. No acute intracranial pathology. 2. ASPECTS is 10 These results were paged via AMION at the time of interpretation on 03/16/2021 at 4:42 pm to provider Dr Rory Percy. Electronically Signed   By: Valetta Mole M.D.   On: 03/16/2021 16:43   CT HEAD CODE STROKE WO CONTRAST  Result Date: 03/12/2021 CLINICAL DATA:  Code stroke. EXAM: CT HEAD WITHOUT CONTRAST TECHNIQUE: Contiguous axial images were obtained from the base of the skull through the vertex without intravenous contrast. COMPARISON:  None. FINDINGS: Brain: No evidence of acute infarction, hemorrhage, cerebral edema, mass, mass effect, or midline shift. Ventricles and sulci are normal for age. No extra-axial fluid collection. Periventricular white matter changes, likely the sequela of chronic small vessel ischemic disease. Generalized atrophy, without lobar predominance. Vascular: No hyperdense vessel or unexpected calcification. Skull: Normal. Negative for fracture or focal lesion. Sinuses/Orbits: No acute finding. Other: The mastoid air cells are well aerated. ASPECTS [facility] Stroke Program Early CT Score) - Ganglionic level infarction (caudate, lentiform nuclei, internal capsule, insula, M1-M3 cortex): 7 - Supraganglionic infarction (M4-M6 cortex): 3 Total score (0-10 with 10 being normal): 10 IMPRESSION: 1. No acute intracranial process. 2. ASPECTS is 10 Code stroke imaging results were communicated on 03/12/2021 at 9:04 pm to provider [place] via secure text paging. Electronically Signed   By: Merilyn Baba M.D.    On: 03/12/2021 21:04   VAS US CAROTID (at [facility] and [facility] only)  Result Date: 03/15/2021 Carotid Arterial Duplex Study Patient Name:  LACEY DOTSON  Date of Exam:   03/13/2021 Medical Rec #: [id number]      Accession #:    [id number] Date of Birth: 03-23-1934      Patient Gender: M Patient Age:   41 years Exam Location:  [facility] Procedure:      VAS US CAROTID Referring Phys: Harrold Donath --------------------------------------------------------------------------------  Indications:       TIA. Risk Factors:      Hypertension, Diabetes, past history of smoking, coronary                    artery disease, PAD. Other Factors:     S/P TAVR. Comparison Study:  11-13-2016 Carotid duplex showed 1-39% ICA stenosis                    bilaterally. Performing Technologist: Darlin Coco RDMS, RVT  Examination Guidelines: A complete evaluation includes B-mode imaging, spectral Doppler, color Doppler, and power Doppler as needed of all accessible portions of each vessel. Bilateral testing is considered an integral part of a complete examination. Limited examinations for reoccurring indications may be performed as noted.  Right Carotid Findings: +----------+--------+-------+--------+----------------------+------------------+             PSV cm/s EDV     Stenosis Plaque Description     Comments                                 cm/s                                                        +----------+--------+-------+--------+----------------------+------------------+  CCA Prox   72       13                                      intimal thickening  +----------+--------+-------+--------+----------------------+------------------+  CCA Distal 65       11                                      intimal thickening  +----------+--------+-------+--------+----------------------+------------------+  ICA Prox   90       21      1-39%    smooth and                                                                       heterogenous                                +----------+--------+-------+--------+----------------------+------------------+  ICA Distal 71       21                                                          +----------+--------+-------+--------+----------------------+------------------+  ECA        72       9                                                           +----------+--------+-------+--------+----------------------+------------------+ +----------+--------+-------+----------------+-------------------+             PSV cm/s EDV cms Describe         Arm Pressure (mmHG)  +----------+--------+-------+----------------+-------------------+  Subclavian 70               Multiphasic, WNL                      +----------+--------+-------+----------------+-------------------+ +---------+--------+--+--------+-+---------+  Vertebral PSV cm/s 38 EDV cm/s 8 Antegrade  +---------+--------+--+--------+-+---------+  Left Carotid Findings: +----------+--------+-------+--------+----------------------+------------------+             PSV cm/s EDV     Stenosis Plaque Description     Comments                                 cm/s                                                        +----------+--------+-------+--------+----------------------+------------------+  CCA Prox   70       16                                      intimal thickening  +----------+--------+-------+--------+----------------------+------------------+  CCA Distal 61       13                                      intimal thickening  +----------+--------+-------+--------+----------------------+------------------+  ICA Prox   64       14      1-39%    smooth and                                                                       heterogenous                               +----------+--------+-------+--------+----------------------+------------------+  ICA Distal 66       14                                                           +----------+--------+-------+--------+----------------------+------------------+  ECA        81       10                                                          +----------+--------+-------+--------+----------------------+------------------+ +----------+--------+--------+----------------+-------------------+  PSV cm/s EDV cm/s Describe         Arm Pressure (mmHG)  +----------+--------+--------+----------------+-------------------+  Subclavian 115               Multiphasic, WNL                      +----------+--------+--------+----------------+-------------------+ +---------+--------+--+--------+--+---------+  Vertebral PSV cm/s 69 EDV cm/s 18 Antegrade  +---------+--------+--+--------+--+---------+   Summary: Right Carotid: Velocities in the right ICA are consistent with a 1-39% stenosis. Left Carotid: Velocities in the left ICA are consistent with a 1-39% stenosis. Vertebrals:  Bilateral vertebral arteries demonstrate antegrade flow. Subclavians: Normal flow hemodynamics were seen in bilateral subclavian              arteries. *See table(s) above for measurements and observations.  Electronically signed by Antony Contras MD on 03/15/2021 at 12:14:11 PM.    Final    VAS [place] LOWER EXTREMITY ARTERIAL DUPLEX  Result Date: 02/24/2021 LOWER EXTREMITY ARTERIAL DUPLEX STUDY Patient Name:  ALDO SONDGEROTH  Date of Exam:   02/24/2021 Medical Rec #: [id number]      Accession #:    [id number] Date of Birth: [birth year]      Patient Gender: M Patient Age:   21 years Exam Location:  [facility] Procedure:      VAS [place] LOWER EXTREMITY ARTERIAL DUPLEX Referring Phys: Dagoberto Ligas --------------------------------------------------------------------------------   Vascular Interventions: Right SFA stent on 02/01/19. Current ABI:            Right: 1.07 Left: 0.48 Comparison Study: 08/13/20: Patent stent with no stenosis. Performing Technologist: Ralene Cork RVT  Examination Guidelines: A complete  evaluation includes B-mode imaging, spectral Doppler, color Doppler, and power Doppler as needed of all accessible portions of each vessel. Bilateral testing is considered an integral part of a complete examination. Limited examinations for reoccurring indications may be performed as noted.  +-----------+--------+-----+--------+----------+--------+  RIGHT       PSV cm/s Ratio Stenosis Waveform   Comments  +-----------+--------+-----+--------+----------+--------+  CFA Distal  88                      triphasic            +-----------+--------+-----+--------+----------+--------+  DFA         58                      biphasic             +-----------+--------+-----+--------+----------+--------+  SFA Prox    58                      biphasic             +-----------+--------+-----+--------+----------+--------+  SFA Mid     62                      biphasic             +-----------+--------+-----+--------+----------+--------+  SFA Distal  60                      biphasic             +-----------+--------+-----+--------+----------+--------+  POP Prox    45                      biphasic             +-----------+--------+-----+--------+----------+--------+  POP Distal  47                      biphasic             +-----------+--------+-----+--------+----------+--------+  ATA Distal  11                      monophasic           +-----------+--------+-----+--------+----------+--------+  PTA Distal  64                      triphasic            +-----------+--------+-----+--------+----------+--------+  PERO Distal 36                      biphasic             +-----------+--------+-----+--------+----------+--------+  Right Stent(s): +---------------+--++--------++  Prox to Stent   62  biphasic   +---------------+--++--------++  Proximal Stent  72  biphasic   +---------------+--++--------++  Mid Stent       91  biphasic   +---------------+--++--------++  Distal Stent    83  biphasic   +---------------+--++--------++  Distal to  Stent 70  biphasic   +---------------+--++--------++  Summary: Right: Patent stent with no stenosis.  See table(s) above for measurements and observations. Electronically signed by Harold Barban MD on 02/24/2021 at 6:34:47 PM.    Final      EKG EKG Interpretation  Date/Time:  Sunday March 16 2021 16:55:59 EST Ventricular Rate:  57 PR Interval:  64 QRS Duration: 116 QT Interval:  483 QTC Calculation: 471 R Axis:   91 Text Interpretation: Sinus rhythm Multiple premature complexes, vent & supraven Incomplete right bundle branch block Confirmed by Lajean Saver [phone]) on 03/16/2021 5:05:32 PM  Radiology DG Chest Port 1 View  Result Date: 03/16/2021 CLINICAL DATA:  Shortness of breath. EXAM: PORTABLE CHEST 1 VIEW COMPARISON:  Chest x-ray 03/07/2018. FINDINGS: The heart size and mediastinal contours are within normal limits. Both lungs are clear. The visualized skeletal structures are unremarkable. IMPRESSION: No active disease. Electronically Signed   By: Ronney Asters M.D.   On: 03/16/2021 18:00   CT HEAD CODE STROKE WO CONTRAST  Result Date: 03/16/2021 CLINICAL DATA:  Code stroke.  Slurred speech EXAM: CT HEAD WITHOUT CONTRAST TECHNIQUE: Contiguous axial images were obtained from the base of the skull through the vertex without intravenous contrast. COMPARISON:  CT head 03/12/2021, brain MRI 03/13/2021 FINDINGS: Brain: There is no evidence of acute intracranial hemorrhage, extra-axial fluid collection, or acute infarct. There is mild global parenchymal volume loss with commensurate enlargement of the ventricular system, unchanged. Confluent hypodensity in the subcortical and periventricular white matter likely reflects sequela of chronic white matter microangiopathy. 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 There is no solid mass lesion.  There is no midline shift. Vascular: There is calcification of the bilateral cavernous ICAs. Skull:  Normal. Negative for fracture or focal lesion. Sinuses/Orbits: The imaged paranasal sinuses are clear. The globes and orbits are unremarkable. Other: None. ASPECTS [facility] Stroke Program Early CT Score) - Ganglionic level infarction (caudate, lentiform nuclei, internal capsule, insula, M1-M3 cortex): 7 - Supraganglionic infarction (M4-M6 cortex): 3 Total score (0-10 with 10 being normal): 10 IMPRESSION: 1. No acute intracranial pathology. 2. ASPECTS is 10 These results were paged via AMION at the time of interpretation on 03/16/2021 at 4:42 pm  to provider Dr Rory Percy. Electronically Signed   By: Valetta Mole M.D.   On: 03/16/2021 16:43   CT ANGIO HEAD NECK W WO CM W PERF (CODE STROKE)  Result Date: 03/16/2021 CLINICAL DATA:  Neuro deficit, stroke suspected EXAM: CT ANGIOGRAPHY HEAD AND NECK TECHNIQUE: Multidetector CT imaging of the head and neck was performed using the standard protocol during bolus administration of intravenous contrast. Multiplanar CT image reconstructions and MIPs were obtained to evaluate the vascular anatomy. Carotid stenosis measurements (when applicable) are obtained utilizing NASCET criteria, using the distal internal carotid diameter as the denominator. CONTRAST:  5mL OMNIPAQUE IOHEXOL 350 MG/ML SOLN COMPARISON:  Same-day noncontrast CT head, MRA head 03/13/2021 FINDINGS: CTA NECK FINDINGS Aortic arch: There is calcified atherosclerotic plaque in the imaged aortic arch without evidence of hemodynamically significant stenosis or dissection to the level imaged. The origins of the major branch vessels are patent. Right carotid system: The right common, internal, and external carotid arteries are patent, without hemodynamically significant stenosis, occlusion, dissection, or aneurysm. There is a medialized course of the right internal carotid artery. Left carotid system: The left common, internal, and external carotid arteries are patent, without hemodynamically significant stenosis,  occlusion, dissection, or aneurysm. There is a medialized course of the left internal carotid artery. Vertebral arteries: Vertebral arteries are patent, without hemodynamically significant stenosis, occlusion, dissection, or aneurysm. Skeleton: There is multilevel degenerative change of the cervical spine, most advanced at C3-C4 through C5-C6. There is no visible canal hematoma. There is no acute osseous abnormality or aggressive osseous lesion. Other neck: The soft tissues are unremarkable. Upper chest: There is a 4 mm nodule in the left lung apex (7-278). Review of the MIP images confirms the above findings CTA HEAD FINDINGS Anterior circulation: There is calcified atherosclerotic plaque in the bilateral cavernous ICAs resulting in up to mild stenosis bilaterally. There is short-segment occlusion of a proximal right M2 branch with distal reconstitution, unchanged compared to the recent prior MRA (8-84, 11-19). The distal right MCA branches are patent. The left MCA and branches are patent. The bilateral ACAs are patent. There is no aneurysm. Posterior circulation: The bilateral V4 segments are patent. The basilar artery is patent. The bilateral PCAs are patent.  There is a fetal PCA on the left. There is no aneurysm. Venous sinuses: As permitted by contrast timing, patent. Anatomic variants: As above. Review of the MIP images confirms the above findings IMPRESSION: 1. 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 2. Mild calcified atherosclerotic plaque in the bilateral intracranial ICAs without hemodynamically significant stenosis or occlusion. Otherwise, patent intracranial vasculature. 3. Patent vasculature of the neck with no significant stenosis, occlusion, dissection, or aneurysm. 4. 4 mm left solid pulmonary nodule in the left apex. If patient is low risk for malignancy, no routine follow-up imaging is recommended; if patient is high risk for malignancy, a non-contrast  Chest CT at 12 months is optional. If performed and the nodule is stable at 12 months, no further follow-up is recommended. These guidelines do not apply to immunocompromised patients and patients with cancer. Follow up in patients with significant comorbidities as clinically warranted. For lung cancer screening, adhere to Lung-RADS guidelines. Reference: Radiology. 2017; 284(1):228-43. Electronically Signed   By: Valetta Mole M.D.   On: 03/16/2021 17:13    Procedures Procedures   Medications Ordered in ED Medications  sodium chloride flush (NS) 0.9 % injection 3 mL (3 mLs Intravenous Given 03/16/21 1658)  iohexol (OMNIPAQUE) 350 MG/ML  injection 60 mL (60 mLs Intravenous Contrast Given 03/16/21 1650)    ED Course  I have reviewed the triage vital signs and the nursing notes.  Pertinent labs & imaging results that were available during my care of the patient were reviewed by me and considered in my medical decision making (see chart for details).    MDM Rules/Calculators/A&P                         Pt was code stroke activation on arrival - neurology emergently consulted.   Iv ns. Continuous pulse ox and cardiac monitoring. Stat labs. Ecg. Pcxr. CT .  Reviewed nursing notes and prior charts for additional history.   CT reviewed/interpreted by me - no hem.   Neurology indicates has vessel narrowing on CTA but no def cva - indicates recommends admission/re-admission to medicine, MRI, and he will leave note with recommendations.   Labs reviewed/interpreted by me -  wbc and hgb normal. K normal. Trop mildly high.   Additional labs reviewed/interpreted by me - ua w > 50 wbc. Will cx and tx.   Recheck pt, no cp, breathing comfortably.   Hospitalists consulted for admission.      Final Clinical Impression(s) / ED Diagnoses Final diagnoses:  None    Rx / DC Orders ED Discharge Orders     None        Lajean Saver, MD 03/16/21 [phone]

## 2021-03-16 NOTE — ED Triage Notes (Addendum)
Patient just discharged from hospital this past Thursday for TIA evaluation. Today he informed his wife that he wasn't feeling well and she noted his BP to be low. Patient with no drift, alert and oriented, BP noted in 80s but slurred speech noted. Patient denies cp on arrival but reports that breathing not right.  Per spouse speech LKW today 1400 CBG 111

## 2021-03-16 NOTE — ED Notes (Signed)
Patient transported to MRI 

## 2021-03-16 NOTE — ED Notes (Signed)
CALLED [facility] TO ACTIVATE CODE STROKE

## 2021-03-16 NOTE — Consult Note (Signed)
Neurology Consultation  Reason for Consult: Code stroke for slurred speech slurred speech Referring Physician: Dr. Delice Lesch I will  CC: Chest discomfort, slurred speech  History is obtained from: Chart, patient's, patient's wife  HPI: Robert Horn is a 87 y.o. male past medical history of diabetes, hyperlipidemia, peripheral vascular disease, neuropathy, obesity, colon cancer status post colectomy, CHF-chronic combined, status post TAVR, CKD 3, anxiety, recent work-up with discharge on 03/13/2021 for possible TIA started on dual antiplatelets-aspirin and Brilinta for a month followed by Brilinta only-MRA head with short segment occlusion versus high-grade stenosis of the right M2 and the left M2 mild stenosis, EF 45 to 50%, carotid Dopplers with no significant stenosis in the neck, brought by wife for concern for slurred speech and chest discomfort. Due to presentation of slurred speech, code stroke activated at the bridge. Wife reports that he has not been feeling like himself ever since the discharge on 03/13/2021, he had no good appetite, he had some improvement after eating a meal this morning and was feeling normal but around 2:30 PM started complaining of not feeling well.  He said he felt like he was having some chest discomfort and similar symptoms to when he had a CHF exacerbation.  Her speech was also noted to be somewhat slurred.  Due to the slurred speech, code stroke was activated. There was no focal tingling numbness or weakness. Patient was seen and examined in the CT scanner where he was taken directly from the triage after code stroke activation  His admission on 03/12/2021 was for left-sided facial droop and dysarthria along with left-sided weakness-all of which were transient and had mostly resolved by the time that he was seen.  No thrombolysis administered.   In the ED, his blood pressure systolic over in the 95K to 90s range CBG was normal.  2D echo done in recent  admission-03/13/2021 LVEF 45 to 50% with mildly reduced LV function.  Grade 1 diastolic dysfunction.  RV function normal.  Left atrium severely dilated.  Mitral valve normal.  Aortic valve gradients are moderately elevated unchanged from 02/17/2019.   LKW: Per the code stroke note 2:30 PM but the wife says that he has not been in his usual state of health ever since discharge on 03/13/2021. tpa given?: no, mild symptoms and unclear last known well Premorbid modified Rankin scale (mRS):0   ROS: Obtained and documented in the HPI.  Otherwise negative.  Past Medical History:  Diagnosis Date   Anemia    Anxiety    situational- surgery   Asthma    as a child   Cancer ([place]) 2019   colon- colectomy    CHF (congestive heart failure) ([place])    Chronic kidney disease    followed by Dr. Delfina Redwood   Coronary artery disease    Diabetes mellitus without complication ([place])    Type II   Dyslipidemia 10/27/2015   Dyspnea    w/ exertion    Elevated PSA 10/27/2015   Erectile dysfunction 10/27/2015   Heart murmur    Hypertension 10/27/2015   Hypogonadism male 10/27/2015   Neuropathy    Obesity 10/27/2015   Peripheral vascular disease ([place])    Pneumonia 10/27/2015   pt states was 1982   Rotator cuff tear 10/27/2015   Family History  Problem Relation Age of Onset   Diabetes Mother    Heart disease Mother    Pulmonary embolism Father      Social History:   reports that he has quit smoking.  His smoking use included cigarettes. He has never used smokeless tobacco. He reports current alcohol use. He reports that he does not use drugs.  Medications  Current Facility-Administered Medications:    sodium chloride flush (NS) 0.9 % injection 3 mL, 3 mL, Intravenous, Once, Lajean Saver, MD  Current Outpatient Medications:    amLODipine (NORVASC) 10 MG tablet, TAKE 1 TABLET BY MOUTH EVERY DAY (Patient taking differently: Take 10 mg by mouth daily.), Disp: 90 tablet, Rfl: 2   aspirin 81 MG EC  tablet, Take 81 mg by mouth daily. Swallow whole., Disp: , Rfl:    carvedilol (COREG) 3.125 MG tablet, TAKE 1 TABLET (3.125 MG TOTAL) BY MOUTH 2 (TWO) TIMES DAILY. (Patient taking differently: Take 3.125 mg by mouth 2 (two) times daily with a meal.), Disp: 180 tablet, Rfl: 3   Cholecalciferol (VITAMIN D) 50 MCG (2000 UT) tablet, Take 2,000 Units by mouth daily., Disp: , Rfl:    [START ON 04/14/2021] clopidogrel (PLAVIX) 75 MG tablet, Take 1 tablet (75 mg total) by mouth daily., Disp: , Rfl:    ferrous sulfate 325 (65 FE) MG tablet, Take 325 mg by mouth daily with breakfast., Disp: , Rfl:    finasteride (PROSCAR) 5 MG tablet, Take 5 mg by mouth daily., Disp: , Rfl:    furosemide (LASIX) 40 MG tablet, TAKE 1.5 TABLETS DAILY. (Patient taking differently: Take 60 mg by mouth daily.), Disp: 135 tablet, Rfl: 2   JARDIANCE 25 MG TABS tablet, Take 25 mg by mouth daily., Disp: , Rfl:    LOVAZA 1 g capsule, TAKE 1 CAPSULE BY MOUTH EVERY DAY (Patient taking differently: Take 1 g by mouth daily.), Disp: 90 capsule, Rfl: 3   Multiple Vitamin (MULTIVITAMIN WITH MINERALS) TABS tablet, Take 1 tablet by mouth daily., Disp: 30 tablet, Rfl: 1   naproxen sodium (ALEVE) 220 MG tablet, Take 220 mg by mouth daily as needed (pain)., Disp: , Rfl:    nitroGLYCERIN (NITRODUR - DOSED IN MG/24 HR) 0.2 mg/hr patch, Place 1 patch (0.2 mg total) onto the skin daily. (Patient not taking: Reported on 03/13/2021), Disp: 30 patch, Rfl: 12   pentoxifylline (TRENTAL) 400 MG CR tablet, TAKE 1 TABLET BY MOUTH 3 TIMES DAILY WITH MEALS. (Patient not taking: Reported on 03/13/2021), Disp: 270 tablet, Rfl: 1   rosuvastatin (CRESTOR) 20 MG tablet, Take 20 mg by mouth daily., Disp: , Rfl:    sacubitril-valsartan (ENTRESTO) 49-51 MG, Take 1 tablet by mouth 2 (two) times daily., Disp: 60 tablet, Rfl: 11   tamsulosin (FLOMAX) 0.4 MG CAPS capsule, Take 0.4 mg by mouth daily., Disp: , Rfl:    ticagrelor (BRILINTA) 90 MG TABS tablet, Take 1 tablet (90  mg total) by mouth 2 (two) times daily., Disp: 60 tablet, Rfl: 0   VITAMIN B COMPLEX-C PO, Take 1 tablet by mouth daily., Disp: , Rfl:    Exam: Current vital signs: BP (!) 89/71 (BP Location: Left Leg)    Pulse 61    Temp 98.4 F (36.9 C)    Resp 17    SpO2 99%  Vital signs in last 24 hours: Temp:  [98.4 F (36.9 C)] 98.4 F (36.9 C) (12/18 1550) Pulse Rate:  [61] 61 (12/18 1550) Resp:  [17] 17 (12/18 1550) BP: (89)/(71) 89/71 (12/18 1550) SpO2:  [99 %] 99 % (12/18 1550) General: Awake alert in no distress HEENT normocephalic atraumatic Lungs: Clear Cardiovascular: Regular rhythm Abdomen soft nondistended nontender Extremities warm well perfused Neurological exam Awake alert oriented x3  Speech is mildly dysarthric No evidence of aphasia Cranial nerves II to XII intact Motor examination with no drift Sensory intact No coordination deficits NIH-1  Labs I have reviewed labs in epic and the results pertinent to this consultation are:  CBC    Component Value Date/Time   WBC 5.7 03/12/2021 2044   RBC 4.52 03/12/2021 2044   HGB 15.0 03/16/2021 1627   HGB 12.6 (L) 11/24/2018 0849   HCT 44.0 03/16/2021 1627   HCT 38.7 11/24/2018 0849   PLT PLATELET CLUMPS NOTED ON SMEAR, UNABLE TO ESTIMATE 03/12/2021 2044   PLT 82 (LL) 11/24/2018 0849   MCV 92.3 03/12/2021 2044   MCV 91 11/24/2018 0849   MCH 29.6 03/12/2021 2044   MCHC 32.1 03/12/2021 2044   RDW 14.6 03/12/2021 2044   RDW 13.8 11/24/2018 0849   LYMPHSABS 1.5 03/12/2021 2044   MONOABS 1.2 (H) 03/12/2021 2044   EOSABS 0.1 03/12/2021 2044   BASOSABS 0.0 03/12/2021 2044    CMP     Component Value Date/Time   NA 138 03/16/2021 1627   NA 139 11/24/2018 0849   K 3.5 03/16/2021 1627   CL 103 03/16/2021 1627   CO2 23 03/12/2021 2044   GLUCOSE 122 (H) 03/16/2021 1627   BUN 34 (H) 03/16/2021 1627   BUN 21 11/24/2018 0849   CREATININE 1.80 (H) 03/16/2021 1627   CREATININE 1.25 (H) 12/17/2015 0825   CALCIUM 9.0  03/12/2021 2044   PROT 7.1 03/12/2021 2044   ALBUMIN 3.7 03/12/2021 2044   AST 31 03/12/2021 2044   ALT 19 03/12/2021 2044   ALKPHOS 48 03/12/2021 2044   BILITOT 0.5 03/12/2021 2044   GFRNONAA 34 (L) 03/12/2021 2044   GFRAA 49 (L) 08/02/2019 1056    Lipid Panel     Component Value Date/Time   CHOL 180 03/13/2021 0319   TRIG 78 03/13/2021 0319   HDL 61 03/13/2021 0319   CHOLHDL 3.0 03/13/2021 0319   VLDL 16 03/13/2021 0319   LDLCALC 103 (H) 03/13/2021 0319     Imaging I have reviewed the images obtained CT-head-no acute changes.  Aspects 10 CT angio head and neck with right M2 severe stenosis at the origin versus near occlusion with patent distal flow-likely chronic.  Left MCA M2 stenosis on MRA 2 days ago seems less severe on CTA. Fetal PCA on the left.  Assessment: 87 year old with above past medical history including that for a recent TIA evaluation with significant stenosis in the right inferior division M2 origin and left distal M1/proximal M2 origin presenting with slurred speech after he started having symptoms of feeling generally unwell and chest discomfort. His blood pressures were low-he was hypotensive on arrival. Suspect his symptoms are secondary to hypoperfusion of the brain in the setting of known bilateral MCA stenoses. At this time, I would recommend evaluating his chest discomfort, consider repeating echo and obtaining a repeat MRI Not a candidate for tPA due to above-mentioned reasons in the HPI section. Low NIH as well as unclear timeline of last known well to consider emergent revascularization of the MCA.  Recommendations: -Admit for obs since not back to baseline per family -Repeat 2D echo -Check orthostatic vitals. -MRI of the brain without contrast -Keep his blood pressures systolic at least above 720. -Continue aspirin and Brilinta -Continue statin -His symptoms might simply be related to hypoperfusion in the setting of intracranial stenosis and  his blood pressure management is the mainstay of treatment. -Evaluate for symptoms that can cause exacerbation  of neurological deficits-check for UTI and pneumonia.  Plan relayed to Dr. Ashok Cordia  -- Amie [place], MD Neurologist [facility] Pager: [phone]

## 2021-03-17 ENCOUNTER — Emergency Department (HOSPITAL_COMMUNITY): Payer: Medicare HMO

## 2021-03-17 ENCOUNTER — Encounter (HOSPITAL_COMMUNITY): Payer: Self-pay

## 2021-03-17 ENCOUNTER — Other Ambulatory Visit: Payer: Self-pay

## 2021-03-17 ENCOUNTER — Other Ambulatory Visit (HOSPITAL_COMMUNITY): Payer: Self-pay

## 2021-03-17 ENCOUNTER — Emergency Department (HOSPITAL_COMMUNITY)
Admission: EM | Admit: 2021-03-17 | Discharge: 2021-03-18 | Disposition: A | Discharge status: Home / Self Care | Payer: Medicare HMO | Source: Home / Self Care

## 2021-03-17 DIAGNOSIS — R4701 Aphasia: Secondary | ICD-10-CM | POA: Diagnosis not present

## 2021-03-17 DIAGNOSIS — I5042 Chronic combined systolic (congestive) and diastolic (congestive) heart failure: Secondary | ICD-10-CM

## 2021-03-17 DIAGNOSIS — U071 COVID-19: Secondary | ICD-10-CM | POA: Diagnosis not present

## 2021-03-17 DIAGNOSIS — R0602 Shortness of breath: Secondary | ICD-10-CM | POA: Diagnosis not present

## 2021-03-17 DIAGNOSIS — I6782 Cerebral ischemia: Secondary | ICD-10-CM | POA: Diagnosis not present

## 2021-03-17 DIAGNOSIS — R531 Weakness: Secondary | ICD-10-CM | POA: Diagnosis not present

## 2021-03-17 DIAGNOSIS — R29818 Other symptoms and signs involving the nervous system: Secondary | ICD-10-CM | POA: Diagnosis not present

## 2021-03-17 DIAGNOSIS — I639 Cerebral infarction, unspecified: Secondary | ICD-10-CM | POA: Insufficient documentation

## 2021-03-17 DIAGNOSIS — G319 Degenerative disease of nervous system, unspecified: Secondary | ICD-10-CM | POA: Diagnosis not present

## 2021-03-17 DIAGNOSIS — G459 Transient cerebral ischemic attack, unspecified: Secondary | ICD-10-CM | POA: Diagnosis not present

## 2021-03-17 DIAGNOSIS — Z5321 Procedure and treatment not carried out due to patient leaving prior to being seen by health care provider: Secondary | ICD-10-CM | POA: Insufficient documentation

## 2021-03-17 DIAGNOSIS — I10 Essential (primary) hypertension: Secondary | ICD-10-CM | POA: Diagnosis not present

## 2021-03-17 LAB — LIPID PANEL
Cholesterol: 160 mg/dL (ref 0–200)
HDL: 56 mg/dL (ref >40)
LDL Cholesterol: 89 mg/dL (ref 0–99)
Total CHOL/HDL Ratio: 2.9 RATIO
Triglycerides: 77 mg/dL (ref <150)
VLDL: 15 mg/dL (ref 0–40)

## 2021-03-17 LAB — BASIC METABOLIC PANEL
Anion gap: 10 (ref 5–15)
BUN: 31 mg/dL — ABNORMAL HIGH (ref 8–23)
CO2: 24 mmol/L (ref 22–32)
Calcium: 8.7 mg/dL — ABNORMAL LOW (ref 8.9–10.3)
Chloride: 105 mmol/L (ref 98–111)
Creatinine, Ser: 1.72 mg/dL — ABNORMAL HIGH (ref 0.61–1.24)
GFR, Estimated: 38 mL/min — ABNORMAL LOW (ref >60)
Glucose, Bld: 152 mg/dL — ABNORMAL HIGH (ref 70–99)
Potassium: 3.4 mmol/L — ABNORMAL LOW (ref 3.5–5.1)
Sodium: 139 mmol/L (ref 135–145)

## 2021-03-17 LAB — LACTIC ACID, PLASMA: Lactic Acid, Venous: 1.6 mmol/L (ref 0.5–1.9)

## 2021-03-17 LAB — CBC WITH DIFFERENTIAL/PLATELET
Abs Immature Granulocytes: 0 10*3/uL (ref 0.00–0.07)
Basophils Absolute: 0 10*3/uL (ref 0.0–0.1)
Basophils Relative: 1 %
Eosinophils Absolute: 0 10*3/uL (ref 0.0–0.5)
Eosinophils Relative: 1 %
HCT: 42.2 % (ref 39.0–52.0)
Hemoglobin: 13.9 g/dL (ref 13.0–17.0)
Immature Granulocytes: 0 %
Lymphocytes Relative: 36 %
Lymphs Abs: 1.6 10*3/uL (ref 0.7–4.0)
MCH: 29.9 pg (ref 26.0–34.0)
MCHC: 32.9 g/dL (ref 30.0–36.0)
MCV: 90.8 fL (ref 80.0–100.0)
Monocytes Absolute: 1.1 10*3/uL — ABNORMAL HIGH (ref 0.1–1.0)
Monocytes Relative: 26 %
Neutro Abs: 1.6 10*3/uL — ABNORMAL LOW (ref 1.7–7.7)
Neutrophils Relative %: 36 %
Platelets: UNDETERMINED 10*3/uL (ref 150–400)
RBC: 4.65 MIL/uL (ref 4.22–5.81)
RDW: 14.1 % (ref 11.5–15.5)
Smear Review: UNDETERMINED
WBC: 4.4 10*3/uL (ref 4.0–10.5)
nRBC: 0 % (ref 0.0–0.2)

## 2021-03-17 LAB — BRAIN NATRIURETIC PEPTIDE: B Natriuretic Peptide: 102.2 pg/mL — ABNORMAL HIGH (ref 0.0–100.0)

## 2021-03-17 LAB — TROPONIN I (HIGH SENSITIVITY)
Troponin I (High Sensitivity): 72 ng/L — ABNORMAL HIGH (ref <18)
Troponin I (High Sensitivity): 60 ng/L — ABNORMAL HIGH (ref <18)
Troponin I (High Sensitivity): 56 ng/L — ABNORMAL HIGH (ref <18)

## 2021-03-17 LAB — AMMONIA: Ammonia: 28 umol/L (ref 9–35)

## 2021-03-17 MED ORDER — MOLNUPIRAVIR 200 MG PO CAPS
4.0000 | ORAL_CAPSULE | Freq: Two times a day (BID) | ORAL | 0 refills | Status: AC
  Filled 2021-03-17: qty 32, 4d supply, fill #0

## 2021-03-17 NOTE — ED Notes (Signed)
Patient moved to appropriate seating area. Wife states she thinks he is having a stroke. Vitals obtained and triage RN notified. Triage provider notified and states when a room is available they will re-asses him.

## 2021-03-17 NOTE — ED Triage Notes (Signed)
Pt arrived POV c/o slurred speech, SHOB and unsteady gait. Pt was just discharged at 3pm today and diagnosed with COVID and TIA. Pt's wife states symptoms are worse than when he left.

## 2021-03-17 NOTE — Progress Notes (Addendum)
STROKE TEAM PROGRESS NOTE   INTERVAL HISTORY Patient is seen in his room with no family at the bedside.  He presented last week with a TIA and yesterday at home around 1430 experienced some chest discomfort and slurred speech.  The slurred speech resolved spontaneously, and code stroke CT head revealed no acute abnormalities. MRI also revealed no acute abnormalities.  Patient was noted to be COVID-19 positive with minimal symptoms. MRI scan is again negative for any acute infarct and CT angiogram was unchanged from 2 days ago. Vitals:   03/17/21 0830 03/17/21 0900 03/17/21 1000 03/17/21 1300  BP: 122/76 134/79 128/89 122/81  Pulse: (!) 52 (!) 44 (!) 57 (!) 49  Resp: (!) 21 20 (!) 21 16  Temp:      SpO2: 97% 98% 99% 100%   CBC:  Recent Labs  Lab 03/12/21 2044 03/12/21 2050 03/16/21 1623 03/16/21 1627  WBC 5.7  --  6.0  --   NEUTROABS 3.0  --  3.3  --   HGB 13.4   < > 13.6 15.0  HCT 41.7   < > 43.7 44.0  MCV 92.3  --  91.8  --   PLT PLATELET CLUMPS NOTED ON SMEAR, UNABLE TO ESTIMATE  --  PLATELET CLUMPS NOTED ON SMEAR, UNABLE TO ESTIMATE  --    < > = values in this interval not displayed.   Basic Metabolic Panel:  Recent Labs  Lab 03/12/21 2044 03/12/21 2050 03/16/21 1623 03/16/21 1627  NA 136   < > 136 138  K 3.9   < > 3.5 3.5  CL 108   < > 102 103  CO2 23  --  24  --   GLUCOSE 157*   < > 124* 122*  BUN 27*   < > 31* 34*  CREATININE 1.89*   < > 1.79* 1.80*  CALCIUM 9.0  --  8.9  --    < > = values in this interval not displayed.   Lipid Panel:  Recent Labs  Lab 03/17/21 0354  CHOL 160  TRIG 77  HDL 56  CHOLHDL 2.9  VLDL 15  LDLCALC 89   HgbA1c:  Recent Labs  Lab 03/13/21 0319  HGBA1C 6.9*   Urine Drug Screen: No results for input(s): LABOPIA, COCAINSCRNUR, LABBENZ, AMPHETMU, THCU, LABBARB in the last 168 hours.  Alcohol Level No results for input(s): ETH in the last 168 hours.  IMAGING past 24 hours MR BRAIN WO CONTRAST  Result Date:  03/16/2021 CLINICAL DATA:  TIA EXAM: MRI HEAD WITHOUT CONTRAST TECHNIQUE: Multiplanar, multiecho pulse sequences of the brain and surrounding structures were obtained without intravenous contrast. COMPARISON:  03/13/2021 FINDINGS: Brain: No restricted diffusion to suggest acute or subacute infarct. No acute hemorrhage, mass, mass effect, or midline shift. No hydrocephalus or extra-axial collection. Confluent T2 hyperintense signal in the periventricular white matter, likely the sequela of severe chronic small vessel ischemic disease. No foci of hemosiderin deposition to suggest remote hemorrhage. Generalized cerebral atrophy, without lobar predominance, likely within normal limits for age. Vascular: Normal flow voids. Skull and upper cervical spine: Normal marrow signal. Degenerative changes in the cervical spine are again noted, with at least mild spinal canal stenosis at C3-C4. Sinuses/Orbits: Negative. Other: The mastoids are well aerated. IMPRESSION: No acute intracranial process. Electronically Signed   By: Merilyn Baba M.D.   On: 03/16/2021 21:25   DG Chest Port 1 View  Result Date: 03/16/2021 CLINICAL DATA:  Shortness of breath. EXAM: PORTABLE CHEST 1  VIEW COMPARISON:  Chest x-ray 03/07/2018. FINDINGS: The heart size and mediastinal contours are within normal limits. Both lungs are clear. The visualized skeletal structures are unremarkable. IMPRESSION: No active disease. Electronically Signed   By: Ronney Asters M.D.   On: 03/16/2021 18:00   CT HEAD CODE STROKE WO CONTRAST  Result Date: 03/16/2021 CLINICAL DATA:  Code stroke.  Slurred speech EXAM: CT HEAD WITHOUT CONTRAST TECHNIQUE: Contiguous axial images were obtained from the base of the skull through the vertex without intravenous contrast. COMPARISON:  CT head 03/12/2021, brain MRI 03/13/2021 FINDINGS: Brain: There is no evidence of acute intracranial hemorrhage, extra-axial fluid collection, or acute infarct. There is mild global parenchymal  volume loss with commensurate enlargement of the ventricular system, unchanged. Confluent hypodensity in the subcortical and periventricular white matter likely reflects sequela of chronic white matter microangiopathy. 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 There is no solid mass lesion.  There is no midline shift. Vascular: There is calcification of the bilateral cavernous ICAs. Skull: Normal. Negative for fracture or focal lesion. Sinuses/Orbits: The imaged paranasal sinuses are clear. The globes and orbits are unremarkable. Other: None. ASPECTS [facility] Stroke Program Early CT Score) - Ganglionic level infarction (caudate, lentiform nuclei, internal capsule, insula, M1-M3 cortex): 7 - Supraganglionic infarction (M4-M6 cortex): 3 Total score (0-10 with 10 being normal): 10 IMPRESSION: 1. No acute intracranial pathology. 2. ASPECTS is 10 These results were paged via AMION at the time of interpretation on 03/16/2021 at 4:42 pm to provider Dr Rory Percy. Electronically Signed   By: Valetta Mole M.D.   On: 03/16/2021 16:43   CT ANGIO HEAD NECK W WO CM W PERF (CODE STROKE)  Result Date: 03/16/2021 CLINICAL DATA:  Neuro deficit, stroke suspected EXAM: CT ANGIOGRAPHY HEAD AND NECK TECHNIQUE: Multidetector CT imaging of the head and neck was performed using the standard protocol during bolus administration of intravenous contrast. Multiplanar CT image reconstructions and MIPs were obtained to evaluate the vascular anatomy. Carotid stenosis measurements (when applicable) are obtained utilizing NASCET criteria, using the distal internal carotid diameter as the denominator. CONTRAST:  10mL OMNIPAQUE IOHEXOL 350 MG/ML SOLN COMPARISON:  Same-day noncontrast CT head, MRA head 03/13/2021 FINDINGS: CTA NECK FINDINGS Aortic arch: There is calcified atherosclerotic plaque in the imaged aortic arch without evidence of hemodynamically significant stenosis or dissection to the level  imaged. The origins of the major branch vessels are patent. Right carotid system: The right common, internal, and external carotid arteries are patent, without hemodynamically significant stenosis, occlusion, dissection, or aneurysm. There is a medialized course of the right internal carotid artery. Left carotid system: The left common, internal, and external carotid arteries are patent, without hemodynamically significant stenosis, occlusion, dissection, or aneurysm. There is a medialized course of the left internal carotid artery. Vertebral arteries: Vertebral arteries are patent, without hemodynamically significant stenosis, occlusion, dissection, or aneurysm. Skeleton: There is multilevel degenerative change of the cervical spine, most advanced at C3-C4 through C5-C6. There is no visible canal hematoma. There is no acute osseous abnormality or aggressive osseous lesion. Other neck: The soft tissues are unremarkable. Upper chest: There is a 4 mm nodule in the left lung apex (7-278). Review of the MIP images confirms the above findings CTA HEAD FINDINGS Anterior circulation: There is calcified atherosclerotic plaque in the bilateral cavernous ICAs resulting in up to mild stenosis bilaterally. There is short-segment occlusion of a proximal right M2 branch with distal reconstitution, unchanged compared to the  recent prior MRA (8-84, 11-19). The distal right MCA branches are patent. The left MCA and branches are patent. The bilateral ACAs are patent. There is no aneurysm. Posterior circulation: The bilateral V4 segments are patent. The basilar artery is patent. The bilateral PCAs are patent.  There is a fetal PCA on the left. There is no aneurysm. Venous sinuses: As permitted by contrast timing, patent. Anatomic variants: As above. Review of the MIP images confirms the above findings IMPRESSION: 1. 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 2. Mild calcified atherosclerotic  plaque in the bilateral intracranial ICAs without hemodynamically significant stenosis or occlusion. Otherwise, patent intracranial vasculature. 3. Patent vasculature of the neck with no significant stenosis, occlusion, dissection, or aneurysm. 4. 4 mm left solid pulmonary nodule in the left apex. If patient is low risk for malignancy, no routine follow-up imaging is recommended; if patient is high risk for malignancy, a non-contrast Chest CT at 12 months is optional. If performed and the nodule is stable at 12 months, no further follow-up is recommended. These guidelines do not apply to immunocompromised patients and patients with cancer. Follow up in patients with significant comorbidities as clinically warranted. For lung cancer screening, adhere to Lung-RADS guidelines. Reference: Radiology. 2017; 284(1):228-43. Electronically Signed   By: Valetta Mole M.D.   On: 03/16/2021 17:13    PHYSICAL EXAM General:  Alert-well-developed elderly African-American patient in no acute distress   NEURO:  Mental Status: AA&Ox3  Speech/Language: speech is without dysarthria or aphasia.  Naming, repetition, fluency, and comprehension intact.  Cranial Nerves:  II: PERRL. Visual fields full.  III, IV, VI: EOMI. Eyelids elevate symmetrically.  V: Sensation is intact to light touch and symmetrical to face.  VII: Smile is symmetrical.  VIII: hearing intact to voice. IX, X: Phonation is normal.  XII: tongue is midline without fasciculations. Motor: 5/5 strength to all muscle groups tested.  Sensation- Intact to light touch bilaterally.  Coordination: FTN intact bilaterally, HKS: no ataxia in BLE.No drift.  Gait- deferred   ASSESSMENT/PLAN Mr. Jenna Ardoin is a 87 y.o. male with history of T2DM, HLD, PVD, peripheral neuropathy, obseity, colon cancer s/p colectomy, CHF, TAVR, CKD3 and anxiety presenting with slurred speech in the setting of covid-19 infection.  He presented last week with a TIA and yesterday at  home around 1430 experienced some chest discomfort and slurred speech.  The slurred speech resolved spontaneously, and code stroke CT head and MRI brain revealed no acute abnormalities. Stroke service will sign off now but will remain available for any questions or concerns.  TIA in setting of covid-19 infection:   Code Stroke CT head No acute abnormality. ASPECTS 10.    CTA head & neck focal occlusion of proximal right M2 with distal reconstitution, mild plaque in bilateral ICAs, 55mm sold left sided pulmonary nodule MRI  No acute intracranial process 2D Echo EF 24-23%, grade 1 diastolic dysfunction, mildly dilated left ventricle, no atrial level shunt LDL 89 HgbA1c 6.9 VTE prophylaxis - SCDs    Diet   Diet heart healthy/carb modified Room service appropriate? Yes; Fluid consistency: Thin   aspirin 81 mg daily and Brilinta (ticagrelor) 90 mg bid prior to admission, now on aspirin 81 mg daily and Brilinta (ticagrelor) 90 mg bid.  Therapy recommendations:  home health PT, OT pending Disposition:  pending  Hypertension Home meds:  amlodipine 10 mg daily Stable Keep SBP <180 Long-term BP goal normotensive  Hyperlipidemia Home meds:  rosuastatin 20 mg  daily, resumed in hospital LDL 89, goal < 70 Continue statin at discharge  Diabetes type II Controlled Home meds:  Jardiance 25 mg daily HgbA1c 6.9, goal < 7.0 CBGs Recent Labs    03/16/21 1621  GLUCAP 111*    SSI  Other Stroke Risk Factors Advanced Age >/= 79  Obesity, There is no height or weight on file to calculate BMI., BMI >/= 30 associated with increased stroke risk, recommend weight loss, diet and exercise as appropriate  Hx TIA Congestive heart failure  Other Active Problems 63mm solid left pulmonary nodule- follow up with non-contrast chest CT in 12 months if patient is high risk for malignancy COVID-19 infection Minimal symptoms Treatment per primary team  Hospital day # [place] , MSN,  AGACNP-BC [facility] See Amion for schedule and pager information 03/17/2021 2:04 PM   STROKE MD NOTE : I have personally obtained history,examined this patient, reviewed notes, independently viewed imaging studies, participated in medical decision making and plan of care.ROS completed by me personally and pertinent positives fully documented  I have made any additions or clarifications directly to the above note. Agree with note above.  Patient presented with transient speech difficulties and chest pain in the setting of acute COVID infection.  MRI scan negative for acute stroke and CT angiogram shows stable appearance of intracranial stenosis.  He likely had COVID hypercoagulability following his TIA.  Continue aspirin and Brilinta for 30 days followed by aspirin and Plavix after that.  Treatment for COVID as per primary team.  No further stroke related work-up is necessary as he had a complete work-up last week.  Greater than 50% time during this 35-minute visit was spent on counseling and coordination of care and discussion with patient and care team and answering questions.  Discussed with Dr. Emeline Gins.  Antony Contras, MD Medical Director [facility] Pager: [phone] 03/17/2021 5:29 PM   To contact Stroke Continuity provider, please refer to [email or web address] After hours, contact General Neurology

## 2021-03-17 NOTE — ED Notes (Addendum)
This RN to ED lobby to re-assess pt per daughters request. Pt appears to be resting quietly in recliner, however one daughter persists that pt is altered from baseline and cannot breathe. Spouse also repeatedly expresses concerns about not being able to care for pt at home. Daughters repeatedly asked this RN if pt will be admitted stating "would you send him home like this?", this RN informed that admitting decisions are made by ED providers and are based off of assessments, changes in lab work/imaging and other criteria. Daughter then stated " If you or any doctor would read his chart, you would know this is not his baseline". Pt remains stable, and displayed no apparent distress. This RN discussed findings with triage provider and informed pt is pending head CT. Information relayed to family members by this RN. Daughter expressed displeasure in just having a head CT, and this RN further clarified that it may help identify cause of any acute changes. Daughter also reassured that pt will be evaluated by ED provider.

## 2021-03-17 NOTE — Progress Notes (Signed)
SLP Cancellation Note  Patient Details Name: Robert Horn MRN: [id number] DOB: July 21, 1933   Cancelled evaluation: Screened chart - dysarthria has resolved and MRI was negative for acute abnormalities. No formal speech/language eval warranted. Our service will sign off. [place] Schweppe L. Tivis Ringer, [place] CCC/SLP [facility] Office number [phone] Pager [phone]          [name] 03/17/2021, 3:10 PM

## 2021-03-17 NOTE — Discharge Summary (Signed)
Physician Discharge Summary  Kishan Wachsmuth UXN:[id number] DOB: Jan 04, 1934 DOA: 03/16/2021  PCP: [name], MD  Admit date: 03/16/2021 Discharge date: 03/17/2021  Admitted From:Home Disposition:  Home  Recommendations for Outpatient Follow-up:  Follow up with PCP in 1-2 weeks Please obtain BMP/CBC in one week   Home Health:YES   Discharge Condition: (Stable) CODE STATUS:FULL Diet recommendation: Heart Healthy   Brief/Interim Summary:  Mr. Quayshaun Hubbert is a 87 y.o. male with history of T2DM, HLD, PVD, peripheral neuropathy, obseity, colon cancer s/p colectomy, CHF, TAVR, CKD3 and anxiety presenting with slurred speech in the setting of covid-19 infection.  He presented last week with a TIA he was admitted, and had complete work-up, patient presents yesterday to ED, where he had at home around 1430 experienced some chest discomfort and slurred speech.  The slurred speech resolved spontaneously, and code stroke CT head and MRI brain revealed no acute abnormalities.  Stroke team were consulted, and slurred speech was felt to be secondary to his COVID-19 infection, no further recommendations on discharge.  TIA in the setting of COVID-19 infection -Patient with recent admission, and complete work-up, including CTA head & neck focal occlusion of proximal right M2 with distal reconstitution, mild plaque in bilateral ICAs, 46mm sold left sided pulmonary nodule, 2D Echo EF 25-42%, grade 1 diastolic dysfunction, mildly dilated left ventricle, no atrial level shunt, BGL of 89, and A1c of 6.9. -MRI this admission with no acute findings - aspirin 81 mg daily and Brilinta (ticagrelor) 90 mg bid prior to admission, to continue on discharge (Patient to follow-up with his cardiologist Dr. [name] and his vascular surgeon Dr. Carlis Abbott in that interval.  Tentative plan is to put him back on Plavix after a month)  COVID-19 infection -Patient reports he is vaccinated and boosted x2, no pneumonia, no hypoxia,  given risk factors he will be discharged on Molpunivir   Chronic combined systolic and diastolic CHF Essential hypertension -Home meds include Coreg, Entresto, Jardiance  Hyperlipidemia -LDL is 89, continue with rosuvastatin  Diabetes mellitus -Continue with Jardiance   Discharge Diagnoses:  Principal Problem:   TIA (transient ischemic attack) Active Problems:   Essential hypertension   Chronic combined systolic and diastolic heart failure (HCC)   CKD (chronic kidney disease), stage III ([place])   Diabetes mellitus type 2, controlled ([place])   COVID-19 virus infection    Discharge Instructions  Discharge Instructions     Diet - low sodium heart healthy   Complete by: As directed    Discharge instructions   Complete by: As directed    Follow with Primary MD [name], MD in 10 days   Get CBC, CMP,  checked  by Primary MD next visit.    Activity: As tolerated with Full fall precautions use walker/cane & assistance as needed   Disposition Home    Diet: Heart Healthy  , with feeding assistance and aspiration precautions.    On your next visit with your primary care physician please Get Medicines reviewed and adjusted.   Please request your Prim.MD to go over all Hospital Tests and Procedure/Radiological results at the follow up, please get all Hospital records sent to your Prim MD by signing hospital release before you go home.   If you experience worsening of your admission symptoms, develop shortness of breath, life threatening emergency, suicidal or homicidal thoughts you must seek medical attention immediately by calling 911 or calling your MD immediately  if symptoms less severe.  You Must read complete instructions/literature along with  all the possible adverse reactions/side effects for all the Medicines you take and that have been prescribed to you. Take any new Medicines after you have completely understood and accpet all the possible adverse reactions/side  effects.   Do not drive, operating heavy machinery, perform activities at heights, swimming or participation in water activities or provide baby sitting services if your were admitted for syncope or siezures until you have seen by Primary MD or a Neurologist and advised to do so again.  Do not drive when taking Pain medications.    Do not take more than prescribed Pain, Sleep and Anxiety Medications  Special Instructions: If you have smoked or chewed Tobacco  in the last 2 yrs please stop smoking, stop any regular Alcohol  and or any Recreational drug use.  Wear Seat belts while driving.   Please note  You were cared for by a hospitalist during your hospital stay. If you have any questions about your discharge medications or the care you received while you were in the hospital after you are discharged, you can call the unit and asked to speak with the hospitalist on call if the hospitalist that took care of you is not available. Once you are discharged, your primary care physician will handle any further medical issues. Please note that NO REFILLS for any discharge medications will be authorized once you are discharged, as it is imperative that you return to your primary care physician (or establish a relationship with a primary care physician if you do not have one) for your aftercare needs so that they can reassess your need for medications and monitor your lab values.   Increase activity slowly   Complete by: As directed       Allergies as of 03/17/2021   No Known Allergies      Medication List     STOP taking these medications    amLODipine 10 MG tablet Commonly known as: NORVASC       TAKE these medications    aspirin 81 MG EC tablet Take 81 mg by mouth daily. Swallow whole.   Brilinta 90 MG Tabs tablet Generic drug: ticagrelor Take 1 tablet (90 mg total) by mouth 2 (two) times daily.   carvedilol 3.125 MG tablet Commonly known as: COREG TAKE 1 TABLET (3.125 MG  TOTAL) BY MOUTH 2 (TWO) TIMES DAILY. What changed: when to take this   clopidogrel 75 MG tablet Commonly known as: PLAVIX Take 1 tablet (75 mg total) by mouth daily. Start taking on: April 14, 2021   Entresto 49-51 MG Generic drug: sacubitril-valsartan Take 1 tablet by mouth 2 (two) times daily.   ferrous sulfate 325 (65 FE) MG tablet Take 325 mg by mouth daily with breakfast.   finasteride 5 MG tablet Commonly known as: PROSCAR Take 5 mg by mouth daily.   furosemide 40 MG tablet Commonly known as: LASIX TAKE 1.5 TABLETS DAILY. What changed:  how much to take how to take this when to take this additional instructions   Jardiance 25 MG Tabs tablet Generic drug: empagliflozin Take 25 mg by mouth daily.   Lagevrio 200 MG Caps capsule Generic drug: molnupiravir EUA Take 4 capsules (800 mg total) by mouth 2 (two) times daily for 4 days.   Lovaza 1 g capsule Generic drug: omega-3 acid ethyl esters TAKE 1 CAPSULE BY MOUTH EVERY DAY What changed: how much to take   multivitamin with minerals Tabs tablet Take 1 tablet by mouth daily.   nitroGLYCERIN 0.2  mg/hr patch Commonly known as: NITRODUR - Dosed in mg/24 hr Place 1 patch (0.2 mg total) onto the skin daily.   pentoxifylline 400 MG CR tablet Commonly known as: TRENTAL TAKE 1 TABLET BY MOUTH 3 TIMES DAILY WITH MEALS.   rosuvastatin 20 MG tablet Commonly known as: CRESTOR Take 20 mg by mouth daily.   tamsulosin 0.4 MG Caps capsule Commonly known as: FLOMAX Take 0.4 mg by mouth daily.   VITAMIN B COMPLEX-C PO Take 1 tablet by mouth daily.   Vitamin D 50 MCG (2000 UT) tablet Take 2,000 Units by mouth daily.        Follow-up Information     [name], MD Follow up.   Specialty: Internal Medicine Contact information: [address] [place] [address] [place] [address] [phone]                No Known Allergies  Consultations: neurology   Procedures/Studies: MR ANGIO HEAD WO  CONTRAST  Result Date: 03/13/2021 CLINICAL DATA:  Transient ischemic attack EXAM: MRI HEAD WITHOUT CONTRAST MRA HEAD WITHOUT CONTRAST TECHNIQUE: Multiplanar, multi-echo pulse sequences of the brain and surrounding structures were acquired without intravenous contrast. Angiographic images of the Circle of Willis were acquired using MRA technique without intravenous contrast. COMPARISON:  No prior MRI, correlation is made with CT head 03/12/2021 FINDINGS: MRI HEAD FINDINGS Brain: No restricted diffusion to suggest acute or subacute infarction. No acute hemorrhage, mass, mass effect, or midline shift. No hydrocephalus or extra-axial collection. Confluent T2 hyperintense signal in the periventricular white matter, likely the sequela of severe chronic small vessel ischemic disease. No foci of hemosiderin deposition to suggest remote hemorrhage. Generalized cerebral atrophy, without lobar predominance, likely within normal limits for age. Vascular: Normal flow voids.  Please see MRA findings below. Skull and upper cervical spine: Normal marrow signal. Degenerative changes in the cervical spine, with at least mild spinal canal stenosis at C3-C4. Sinuses/Orbits: No acute or significant finding. Other: The mastoids are well aerated. MRA HEAD FINDINGS Anterior circulation: Both internal carotid arteries are patent to the termini, without stenosis or other abnormality. A1 segments patent. Normal anterior communicating artery. Anterior cerebral arteries are patent to their distal aspects. No M1 stenosis or occlusion. At the right MCA bifurcation, there is focal non opacification of a segment of right M2 (series 5, images 69-77, which is likely chronic as there are multiple collateral vessels in the area. Focal narrowing of a left M2 branch proximally, just distal to the bifurcation (series 5, image 62); this is otherwise patent. Distal MCA branches perfused and symmetric. Posterior circulation: Vertebral arteries patent to  the vertebrobasilar junction without stenosis. Posterior inferior cerebral arteries patent bilaterally. Basilar patent to its distal aspect. Superior cerebellar arteries patent bilaterally. PCAs perfused to their distal aspects without stenosis. Near fetal origin of the left PCA, with a dominant left posterior communicating artery and a diminutive left P1. Normal right P1. The right posterior communicating artery is not visualized. Anatomic variants: None significant IMPRESSION: 1. No acute intracranial process. 2. Focal non opacification of a proximal M2 branch, with distal reconstitution. This may be chronic, as there are multiple small vessels in this area. 3. Focal stenosis at the origin of a left M2 branch, which is otherwise patent. Electronically Signed   By: Merilyn Baba M.D.   On: 03/13/2021 01:19   MR BRAIN WO CONTRAST  Result Date: 03/16/2021 CLINICAL DATA:  TIA EXAM: MRI HEAD WITHOUT CONTRAST TECHNIQUE: Multiplanar, multiecho pulse sequences of the brain  and surrounding structures were obtained without intravenous contrast. COMPARISON:  03/13/2021 FINDINGS: Brain: No restricted diffusion to suggest acute or subacute infarct. No acute hemorrhage, mass, mass effect, or midline shift. No hydrocephalus or extra-axial collection. Confluent T2 hyperintense signal in the periventricular white matter, likely the sequela of severe chronic small vessel ischemic disease. No foci of hemosiderin deposition to suggest remote hemorrhage. Generalized cerebral atrophy, without lobar predominance, likely within normal limits for age. Vascular: Normal flow voids. Skull and upper cervical spine: Normal marrow signal. Degenerative changes in the cervical spine are again noted, with at least mild spinal canal stenosis at C3-C4. Sinuses/Orbits: Negative. Other: The mastoids are well aerated. IMPRESSION: No acute intracranial process. Electronically Signed   By: Merilyn Baba M.D.   On: 03/16/2021 21:25   MR BRAIN WO  CONTRAST  Result Date: 03/13/2021 CLINICAL DATA:  Transient ischemic attack EXAM: MRI HEAD WITHOUT CONTRAST MRA HEAD WITHOUT CONTRAST TECHNIQUE: Multiplanar, multi-echo pulse sequences of the brain and surrounding structures were acquired without intravenous contrast. Angiographic images of the Circle of Willis were acquired using MRA technique without intravenous contrast. COMPARISON:  No prior MRI, correlation is made with CT head 03/12/2021 FINDINGS: MRI HEAD FINDINGS Brain: No restricted diffusion to suggest acute or subacute infarction. No acute hemorrhage, mass, mass effect, or midline shift. No hydrocephalus or extra-axial collection. Confluent T2 hyperintense signal in the periventricular white matter, likely the sequela of severe chronic small vessel ischemic disease. No foci of hemosiderin deposition to suggest remote hemorrhage. Generalized cerebral atrophy, without lobar predominance, likely within normal limits for age. Vascular: Normal flow voids.  Please see MRA findings below. Skull and upper cervical spine: Normal marrow signal. Degenerative changes in the cervical spine, with at least mild spinal canal stenosis at C3-C4. Sinuses/Orbits: No acute or significant finding. Other: The mastoids are well aerated. MRA HEAD FINDINGS Anterior circulation: Both internal carotid arteries are patent to the termini, without stenosis or other abnormality. A1 segments patent. Normal anterior communicating artery. Anterior cerebral arteries are patent to their distal aspects. No M1 stenosis or occlusion. At the right MCA bifurcation, there is focal non opacification of a segment of right M2 (series 5, images 69-77, which is likely chronic as there are multiple collateral vessels in the area. Focal narrowing of a left M2 branch proximally, just distal to the bifurcation (series 5, image 62); this is otherwise patent. Distal MCA branches perfused and symmetric. Posterior circulation: Vertebral arteries patent to  the vertebrobasilar junction without stenosis. Posterior inferior cerebral arteries patent bilaterally. Basilar patent to its distal aspect. Superior cerebellar arteries patent bilaterally. PCAs perfused to their distal aspects without stenosis. Near fetal origin of the left PCA, with a dominant left posterior communicating artery and a diminutive left P1. Normal right P1. The right posterior communicating artery is not visualized. Anatomic variants: None significant IMPRESSION: 1. No acute intracranial process. 2. Focal non opacification of a proximal M2 branch, with distal reconstitution. This may be chronic, as there are multiple small vessels in this area. 3. Focal stenosis at the origin of a left M2 branch, which is otherwise patent. Electronically Signed   By: Merilyn Baba M.D.   On: 03/13/2021 01:19   DG Chest Port 1 View  Result Date: 03/16/2021 CLINICAL DATA:  Shortness of breath. EXAM: PORTABLE CHEST 1 VIEW COMPARISON:  Chest x-ray 03/07/2018. FINDINGS: The heart size and mediastinal contours are within normal limits. Both lungs are clear. The visualized skeletal structures are unremarkable. IMPRESSION: No active disease. Electronically Signed  By: Ronney Asters M.D.   On: 03/16/2021 18:00   VAS [place] ABI WITH/WO TBI  Result Date: 02/24/2021  LOWER EXTREMITY DOPPLER STUDY Patient Name:  EZRAH PANNING  Date of Exam:   02/24/2021 Medical Rec #: [id number]      Accession #:    [id number] Date of Birth: 04-11-1933      Patient Gender: M Patient Age:   36 years Exam Location:  [facility] Procedure:      VAS [place] ABI WITH/WO TBI Referring Phys: Dagoberto Ligas --------------------------------------------------------------------------------   Vascular Interventions: Right SFA stent on 02/01/19. Performing Technologist: Ralene Cork RVT  Examination Guidelines: A complete evaluation includes at minimum, Doppler waveform signals and systolic blood pressure reading at the level of bilateral  brachial, anterior tibial, and posterior tibial arteries, when vessel segments are accessible. Bilateral testing is considered an integral part of a complete examination. Photoelectric Plethysmograph (PPG) waveforms and toe systolic pressure readings are included as required and additional duplex testing as needed. Limited examinations for reoccurring indications may be performed as noted.  ABI Findings: +---------+------------------+-----+---------+--------+  Right     Rt Pressure (mmHg) Index Waveform  Comment   +---------+------------------+-----+---------+--------+  Brachial  123                                          +---------+------------------+-----+---------+--------+  PTA       152                1.06  triphasic           +---------+------------------+-----+---------+--------+  DP        154                1.07  biphasic            +---------+------------------+-----+---------+--------+  Great Toe                                    amp       +---------+------------------+-----+---------+--------+ +---------+------------------+-----+----------+-------+  Left      Lt Pressure (mmHg) Index Waveform   Comment  +---------+------------------+-----+----------+-------+  Brachial  144                                          +---------+------------------+-----+----------+-------+  PTA       102                0.71  monophasic          +---------+------------------+-----+----------+-------+  DP        90                 0.62  monophasic          +---------+------------------+-----+----------+-------+  Great Toe 69                 0.48                      +---------+------------------+-----+----------+-------+ +-------+-----------+-----------+------------+------------+  ABI/TBI Today's ABI Today's TBI Previous ABI Previous TBI  +-------+-----------+-----------+------------+------------+  Right   1.07                    0.95                       +-------+-----------+-----------+------------+------------+  Left     0.71        0.48        0.77         0.09          +-------+-----------+-----------+------------+------------+  Previous ABI on 08/13/20.  Summary: Right: Resting right ankle-brachial index is within normal range. No evidence of significant right lower extremity arterial disease. Left: Resting left ankle-brachial index indicates moderate left lower extremity arterial disease. The left toe-brachial index is abnormal.  *See table(s) above for measurements and observations.  Electronically signed by Harold Barban MD on 02/24/2021 at 6:30:27 PM.    Final    ECHOCARDIOGRAM COMPLETE  Result Date: 03/13/2021    ECHOCARDIOGRAM REPORT   Patient Name:   KHRISTIAN PHILLIPPI Date of Exam: 03/13/2021 Medical Rec #:  [id number]     Height:       72.0 in Accession #:    [id number]    Weight:       227.5 lb Date of Birth:  [birth year]     BSA:          2.251 m Patient Age:    43 years      BP:           138/83 mmHg Patient Gender: M             HR:           53 bpm. Exam Location:  Inpatient Procedure: 2D Echo, Cardiac Doppler, Color Doppler and 3D Echo Indications:    TIA G45.9  History:        Patient has prior history of Echocardiogram examinations, most                 recent 02/01/2019. Aortic Valve Disease, Arrythmias:Bradycardia;                 Risk Factors:Diabetes and Cancer.  Sonographer:    Merrie Roof RDCS Referring Phys: [id number] [place]  1. Left ventricular ejection fraction, by estimation, is 45 to 50%. The left ventricle has mildly decreased function. The left ventricle demonstrates global hypokinesis. The left ventricular internal cavity size was mildly dilated. Left ventricular diastolic parameters are consistent with Grade I diastolic dysfunction (impaired relaxation).  2. Right ventricular systolic function is normal. The right ventricular size is normal. There is normal pulmonary artery systolic pressure.  3. Left atrial size was severely dilated.  4. The mitral valve is normal in structure.  Trivial mitral valve regurgitation. No evidence of mitral stenosis.  5. Aortic valve gradients are moderately elevated, unchanged from 01/2019. Deceleration time 81 msec, consistent with a normally funcitoning prosthetic valve. The aortic valve has been repaired/replaced. Aortic valve regurgitation is not visualized. No aortic stenosis is present. Aortic valve area, by VTI measures 0.52 cm. Aortic valve mean gradient measures 24.5 mmHg. Aortic valve Vmax measures 3.26 m/s.  6. The inferior vena cava is normal in size with greater than 50% respiratory variability, suggesting right atrial pressure of 3 mmHg. FINDINGS  Left Ventricle: Left ventricular ejection fraction, by estimation, is 45 to 50%. The left ventricle has mildly decreased function. The left ventricle demonstrates global hypokinesis. The left ventricular internal cavity size was mildly dilated. There is  no left ventricular hypertrophy. Left ventricular diastolic parameters are consistent with Grade I diastolic dysfunction (impaired relaxation). Right Ventricle: The right ventricular size is normal. No increase in right ventricular wall thickness. Right ventricular systolic function is normal. There is normal pulmonary artery systolic pressure.  The tricuspid regurgitant velocity is 2.38 m/s, and  with an assumed right atrial pressure of 3 mmHg, the estimated right ventricular systolic pressure is 63.3 mmHg. Left Atrium: Left atrial size was severely dilated. Right Atrium: Right atrial size was normal in size. Pericardium: There is no evidence of pericardial effusion. Mitral Valve: The mitral valve is normal in structure. Trivial mitral valve regurgitation. No evidence of mitral valve stenosis. Tricuspid Valve: The tricuspid valve is normal in structure. Tricuspid valve regurgitation is trivial. No evidence of tricuspid stenosis. Aortic Valve: Aortic valve gradients are moderately elevated, unchanged from 01/2019. Deceleration time 81 msec, consistent  with a normally funcitoning prosthetic valve. The aortic valve has been repaired/replaced. Aortic valve regurgitation is not visualized. No aortic stenosis is present. Aortic valve mean gradient measures 24.5 mmHg. Aortic valve peak gradient measures 42.5 mmHg. Aortic valve area, by VTI measures 0.52 cm. There is a 26 mm Sapien prosthetic, stented (TAVR) valve present in the aortic position. Pulmonic Valve: The pulmonic valve was normal in structure. Pulmonic valve regurgitation is not visualized. No evidence of pulmonic stenosis. Aorta: The aortic root is normal in size and structure. Venous: The inferior vena cava is normal in size with greater than 50% respiratory variability, suggesting right atrial pressure of 3 mmHg. IAS/Shunts: No atrial level shunt detected by color flow Doppler.  LEFT VENTRICLE PLAX 2D LVIDd:         5.70 cm      Diastology LVIDs:         4.30 cm      LV e' medial:    3.81 cm/s LV PW:         0.90 cm      LV E/e' medial:  14.1 LV IVS:        1.00 cm      LV e' lateral:   3.70 cm/s LVOT diam:     2.00 cm      LV E/e' lateral: 14.5 LV SV:         43 LV SV Index:   19 LVOT Area:     3.14 cm                              3D Volume EF: LV Volumes (MOD)            3D EF:        51 % LV vol d, MOD A2C: 149.0 ml LV EDV:       238 ml LV vol d, MOD A4C: 135.0 ml LV ESV:       116 ml LV vol s, MOD A2C: 70.5 ml  LV SV:        122 ml LV vol s, MOD A4C: 79.6 ml LV SV MOD A2C:     78.5 ml LV SV MOD A4C:     135.0 ml LV SV MOD BP:      66.8 ml RIGHT VENTRICLE RV Basal diam:  5.10 cm RV Mid diam:    4.40 cm LEFT ATRIUM              Index        RIGHT ATRIUM           Index LA diam:        4.50 cm  2.00 cm/m   RA Area:     22.20 cm LA Vol (A2C):   200.0 ml 88.87 ml/m  RA Volume:  61.90 ml  27.50 ml/m LA Vol (A4C):   182.0 ml 80.87 ml/m LA Biplane Vol: 197.0 ml 87.53 ml/m  AORTIC VALVE AV Area (Vmax):    0.55 cm AV Area (Vmean):   0.54 cm AV Area (VTI):     0.52 cm AV Vmax:           326.00 cm/s AV  Vmean:          233.500 cm/s AV VTI:            0.822 m AV Peak Grad:      42.5 mmHg AV Mean Grad:      24.5 mmHg LVOT Vmax:         57.40 cm/s LVOT Vmean:        40.100 cm/s LVOT VTI:          0.136 m LVOT/AV VTI ratio: 0.17  AORTA Ao Root diam: 2.80 cm Ao Asc diam:  3.40 cm MITRAL VALVE               TRICUSPID VALVE MV Area (PHT): 3.02 cm    TR Peak grad:   22.7 mmHg MV Decel Time: 251 msec    TR Vmax:        238.00 cm/s MV E velocity: 53.60 cm/s MV A velocity: 95.50 cm/s  SHUNTS MV E/A ratio:  0.56        Systemic VTI:  0.14 m                            Systemic Diam: 2.00 cm Skeet Latch MD Electronically signed by Skeet Latch MD Signature Date/Time: 03/13/2021/4:24:18 PM    Final    CT HEAD CODE STROKE WO CONTRAST  Result Date: 03/16/2021 CLINICAL DATA:  Code stroke.  Slurred speech EXAM: CT HEAD WITHOUT CONTRAST TECHNIQUE: Contiguous axial images were obtained from the base of the skull through the vertex without intravenous contrast. COMPARISON:  CT head 03/12/2021, brain MRI 03/13/2021 FINDINGS: Brain: There is no evidence of acute intracranial hemorrhage, extra-axial fluid collection, or acute infarct. There is mild global parenchymal volume loss with commensurate enlargement of the ventricular system, unchanged. Confluent hypodensity in the subcortical and periventricular white matter likely reflects sequela of chronic white matter microangiopathy. 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 There is no solid mass lesion.  There is no midline shift. Vascular: There is calcification of the bilateral cavernous ICAs. Skull: Normal. Negative for fracture or focal lesion. Sinuses/Orbits: The imaged paranasal sinuses are clear. The globes and orbits are unremarkable. Other: None. ASPECTS [facility] Stroke Program Early CT Score) - Ganglionic level infarction (caudate, lentiform nuclei, internal capsule, insula, M1-M3 cortex): 7 - Supraganglionic  infarction (M4-M6 cortex): 3 Total score (0-10 with 10 being normal): 10 IMPRESSION: 1. No acute intracranial pathology. 2. ASPECTS is 10 These results were paged via AMION at the time of interpretation on 03/16/2021 at 4:42 pm to provider Dr Rory Percy. Electronically Signed   By: Valetta Mole M.D.   On: 03/16/2021 16:43   CT HEAD CODE STROKE WO CONTRAST  Result Date: 03/12/2021 CLINICAL DATA:  Code stroke. EXAM: CT HEAD WITHOUT CONTRAST TECHNIQUE: Contiguous axial images were obtained from the base of the skull through the vertex without intravenous contrast. COMPARISON:  None. FINDINGS: Brain: No evidence of acute infarction, hemorrhage, cerebral edema, mass, mass effect, or midline shift. Ventricles and sulci are normal for age. No extra-axial  fluid collection. Periventricular white matter changes, likely the sequela of chronic small vessel ischemic disease. Generalized atrophy, without lobar predominance. Vascular: No hyperdense vessel or unexpected calcification. Skull: Normal. Negative for fracture or focal lesion. Sinuses/Orbits: No acute finding. Other: The mastoid air cells are well aerated. ASPECTS [facility] Stroke Program Early CT Score) - Ganglionic level infarction (caudate, lentiform nuclei, internal capsule, insula, M1-M3 cortex): 7 - Supraganglionic infarction (M4-M6 cortex): 3 Total score (0-10 with 10 being normal): 10 IMPRESSION: 1. No acute intracranial process. 2. ASPECTS is 10 Code stroke imaging results were communicated on 03/12/2021 at 9:04 pm to provider [place] via secure text paging. Electronically Signed   By: Merilyn Baba M.D.   On: 03/12/2021 21:04   VAS US CAROTID (at [facility] and [facility] only)  Result Date: 03/15/2021 Carotid Arterial Duplex Study Patient Name:  DEMARUS LATTERELL  Date of Exam:   03/13/2021 Medical Rec #: [id number]      Accession #:    [id number] Date of Birth: 1933/11/04      Patient Gender: M Patient Age:   89 years Exam Location:  [facility] Procedure:      VAS US  CAROTID Referring Phys: Harrold Donath --------------------------------------------------------------------------------  Indications:       TIA. Risk Factors:      Hypertension, Diabetes, past history of smoking, coronary                    artery disease, PAD. Other Factors:     S/P TAVR. Comparison Study:  11-13-2016 Carotid duplex showed 1-39% ICA stenosis                    bilaterally. Performing Technologist: Darlin Coco RDMS, RVT  Examination Guidelines: A complete evaluation includes B-mode imaging, spectral Doppler, color Doppler, and power Doppler as needed of all accessible portions of each vessel. Bilateral testing is considered an integral part of a complete examination. Limited examinations for reoccurring indications may be performed as noted.  Right Carotid Findings: +----------+--------+-------+--------+----------------------+------------------+             PSV cm/s EDV     Stenosis Plaque Description     Comments                                 cm/s                                                        +----------+--------+-------+--------+----------------------+------------------+  CCA Prox   72       13                                      intimal thickening  +----------+--------+-------+--------+----------------------+------------------+  CCA Distal 65       11                                      intimal thickening  +----------+--------+-------+--------+----------------------+------------------+  ICA Prox   90       21      1-39%    smooth and  heterogenous                               +----------+--------+-------+--------+----------------------+------------------+  ICA Distal 71       21                                                          +----------+--------+-------+--------+----------------------+------------------+  ECA        72       9                                                            +----------+--------+-------+--------+----------------------+------------------+ +----------+--------+-------+----------------+-------------------+             PSV cm/s EDV cms Describe         Arm Pressure (mmHG)  +----------+--------+-------+----------------+-------------------+  Subclavian 70               Multiphasic, WNL                      +----------+--------+-------+----------------+-------------------+ +---------+--------+--+--------+-+---------+  Vertebral PSV cm/s 38 EDV cm/s 8 Antegrade  +---------+--------+--+--------+-+---------+  Left Carotid Findings: +----------+--------+-------+--------+----------------------+------------------+             PSV cm/s EDV     Stenosis Plaque Description     Comments                                 cm/s                                                        +----------+--------+-------+--------+----------------------+------------------+  CCA Prox   70       16                                      intimal thickening  +----------+--------+-------+--------+----------------------+------------------+  CCA Distal 61       13                                      intimal thickening  +----------+--------+-------+--------+----------------------+------------------+  ICA Prox   64       14      1-39%    smooth and                                                                       heterogenous                               +----------+--------+-------+--------+----------------------+------------------+  ICA Distal 66       14                                                          +----------+--------+-------+--------+----------------------+------------------+  ECA        81       10                                                          +----------+--------+-------+--------+----------------------+------------------+ +----------+--------+--------+----------------+-------------------+             PSV cm/s EDV cm/s Describe         Arm Pressure (mmHG)   +----------+--------+--------+----------------+-------------------+  Subclavian 115               Multiphasic, WNL                      +----------+--------+--------+----------------+-------------------+ +---------+--------+--+--------+--+---------+  Vertebral PSV cm/s 69 EDV cm/s 18 Antegrade  +---------+--------+--+--------+--+---------+   Summary: Right Carotid: Velocities in the right ICA are consistent with a 1-39% stenosis. Left Carotid: Velocities in the left ICA are consistent with a 1-39% stenosis. Vertebrals:  Bilateral vertebral arteries demonstrate antegrade flow. Subclavians: Normal flow hemodynamics were seen in bilateral subclavian              arteries. *See table(s) above for measurements and observations.  Electronically signed by Antony Contras MD on 03/15/2021 at 12:14:11 PM.    Final    VAS [place] LOWER EXTREMITY ARTERIAL DUPLEX  Result Date: 02/24/2021 LOWER EXTREMITY ARTERIAL DUPLEX STUDY Patient Name:  KRISTINA BERTONE  Date of Exam:   02/24/2021 Medical Rec #: [id number]      Accession #:    [id number] Date of Birth: [birth year]      Patient Gender: M Patient Age:   62 years Exam Location:  [facility] Procedure:      VAS [place] LOWER EXTREMITY ARTERIAL DUPLEX Referring Phys: Dagoberto Ligas --------------------------------------------------------------------------------   Vascular Interventions: Right SFA stent on 02/01/19. Current ABI:            Right: 1.07 Left: 0.48 Comparison Study: 08/13/20: Patent stent with no stenosis. Performing Technologist: Ralene Cork RVT  Examination Guidelines: A complete evaluation includes B-mode imaging, spectral Doppler, color Doppler, and power Doppler as needed of all accessible portions of each vessel. Bilateral testing is considered an integral part of a complete examination. Limited examinations for reoccurring indications may be performed as noted.  +-----------+--------+-----+--------+----------+--------+  RIGHT       PSV  cm/s Ratio Stenosis Waveform   Comments  +-----------+--------+-----+--------+----------+--------+  CFA Distal  88                      triphasic            +-----------+--------+-----+--------+----------+--------+  DFA         58                      biphasic             +-----------+--------+-----+--------+----------+--------+  SFA Prox    58  biphasic             +-----------+--------+-----+--------+----------+--------+  SFA Mid     62                      biphasic             +-----------+--------+-----+--------+----------+--------+  SFA Distal  60                      biphasic             +-----------+--------+-----+--------+----------+--------+  POP Prox    45                      biphasic             +-----------+--------+-----+--------+----------+--------+  POP Distal  47                      biphasic             +-----------+--------+-----+--------+----------+--------+  ATA Distal  11                      monophasic           +-----------+--------+-----+--------+----------+--------+  PTA Distal  64                      triphasic            +-----------+--------+-----+--------+----------+--------+  PERO Distal 36                      biphasic             +-----------+--------+-----+--------+----------+--------+  Right Stent(s): +---------------+--++--------++  Prox to Stent   62  biphasic   +---------------+--++--------++  Proximal Stent  72  biphasic   +---------------+--++--------++  Mid Stent       91  biphasic   +---------------+--++--------++  Distal Stent    83  biphasic   +---------------+--++--------++  Distal to Stent 70  biphasic   +---------------+--++--------++  Summary: Right: Patent stent with no stenosis.  See table(s) above for measurements and observations. Electronically signed by Harold Barban MD on 02/24/2021 at 6:34:47 PM.    Final    CT ANGIO HEAD NECK W WO CM W PERF (CODE STROKE)  Result Date: 03/16/2021 CLINICAL DATA:  Neuro deficit, stroke suspected EXAM: CT  ANGIOGRAPHY HEAD AND NECK TECHNIQUE: Multidetector CT imaging of the head and neck was performed using the standard protocol during bolus administration of intravenous contrast. Multiplanar CT image reconstructions and MIPs were obtained to evaluate the vascular anatomy. Carotid stenosis measurements (when applicable) are obtained utilizing NASCET criteria, using the distal internal carotid diameter as the denominator. CONTRAST:  57mL OMNIPAQUE IOHEXOL 350 MG/ML SOLN COMPARISON:  Same-day noncontrast CT head, MRA head 03/13/2021 FINDINGS: CTA NECK FINDINGS Aortic arch: There is calcified atherosclerotic plaque in the imaged aortic arch without evidence of hemodynamically significant stenosis or dissection to the level imaged. The origins of the major branch vessels are patent. Right carotid system: The right common, internal, and external carotid arteries are patent, without hemodynamically significant stenosis, occlusion, dissection, or aneurysm. There is a medialized course of the right internal carotid artery. Left carotid system: The left common, internal, and external carotid arteries are patent, without hemodynamically significant stenosis, occlusion, dissection, or aneurysm. There is a medialized course of the left internal carotid artery. Vertebral arteries: Vertebral arteries are patent, without hemodynamically  significant stenosis, occlusion, dissection, or aneurysm. Skeleton: There is multilevel degenerative change of the cervical spine, most advanced at C3-C4 through C5-C6. There is no visible canal hematoma. There is no acute osseous abnormality or aggressive osseous lesion. Other neck: The soft tissues are unremarkable. Upper chest: There is a 4 mm nodule in the left lung apex (7-278). Review of the MIP images confirms the above findings CTA HEAD FINDINGS Anterior circulation: There is calcified atherosclerotic plaque in the bilateral cavernous ICAs resulting in up to mild stenosis bilaterally. There  is short-segment occlusion of a proximal right M2 branch with distal reconstitution, unchanged compared to the recent prior MRA (8-84, 11-19). The distal right MCA branches are patent. The left MCA and branches are patent. The bilateral ACAs are patent. There is no aneurysm. Posterior circulation: The bilateral V4 segments are patent. The basilar artery is patent. The bilateral PCAs are patent.  There is a fetal PCA on the left. There is no aneurysm. Venous sinuses: As permitted by contrast timing, patent. Anatomic variants: As above. Review of the MIP images confirms the above findings IMPRESSION: 1. 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 2. Mild calcified atherosclerotic plaque in the bilateral intracranial ICAs without hemodynamically significant stenosis or occlusion. Otherwise, patent intracranial vasculature. 3. Patent vasculature of the neck with no significant stenosis, occlusion, dissection, or aneurysm. 4. 4 mm left solid pulmonary nodule in the left apex. If patient is low risk for malignancy, no routine follow-up imaging is recommended; if patient is high risk for malignancy, a non-contrast Chest CT at 12 months is optional. If performed and the nodule is stable at 12 months, no further follow-up is recommended. These guidelines do not apply to immunocompromised patients and patients with cancer. Follow up in patients with significant comorbidities as clinically warranted. For lung cancer screening, adhere to Lung-RADS guidelines. Reference: Radiology. 2017; 284(1):228-43. Electronically Signed   By: Valetta Mole M.D.   On: 03/16/2021 17:13      Subjective:  He denies any Dyspnea, cough, fever or chills, he denies any new focal deficits. Discharge Exam: Vitals:   03/17/21 1000 03/17/21 1300  BP: 128/89 122/81  Pulse: (!) 57 (!) 49  Resp: (!) 21 16  Temp:    SpO2: 99% 100%   Vitals:   03/17/21 0830 03/17/21 0900 03/17/21 1000 03/17/21 1300   BP: 122/76 134/79 128/89 122/81  Pulse: (!) 52 (!) 44 (!) 57 (!) 49  Resp: (!) 21 20 (!) 21 16  Temp:      SpO2: 97% 98% 99% 100%    General: Pt is alert, awake, not in acute distress Cardiovascular: RRR, S1/S2 +, no rubs, no gallops Respiratory: CTA bilaterally, no wheezing, no rhonchi Abdominal: Soft, NT, ND, bowel sounds + Extremities: no edema, no cyanosis    The results of significant diagnostics from this hospitalization (including imaging, microbiology, ancillary and laboratory) are listed below for reference.     Microbiology: Recent Results (from the past 240 hour(s))  Resp Panel by RT-PCR (Flu A&B, Covid) Nasopharyngeal Swab     Status: None   Collection Time: 03/12/21 10:33 PM   Specimen: Nasopharyngeal Swab; Nasopharyngeal(NP) swabs in vial transport medium  Result Value Ref Range Status   SARS Coronavirus 2 by RT PCR NEGATIVE NEGATIVE Final    Comment: (NOTE) SARS-CoV-2 target nucleic acids are NOT DETECTED.  The SARS-CoV-2 RNA is generally detectable in upper respiratory specimens during the acute phase of infection. The lowest concentration of SARS-CoV-2  viral copies this assay can detect is 138 copies/mL. A negative result does not preclude SARS-Cov-2 infection and should not be used as the sole basis for treatment or other patient management decisions. A negative result may occur with  improper specimen collection/handling, submission of specimen other than nasopharyngeal swab, presence of viral mutation(s) within the areas targeted by this assay, and inadequate number of viral copies(<138 copies/mL). A negative result must be combined with clinical observations, patient history, and epidemiological information. The expected result is Negative.  Fact Sheet for Patients:  EntrepreneurPulse.com.au  Fact Sheet for Healthcare Providers:  IncredibleEmployment.be  This test is no t yet approved or cleared by the [place] FDA and  has been authorized for detection and/or diagnosis of SARS-CoV-2 by FDA under an Emergency Use Authorization (EUA). This EUA will remain  in effect (meaning this test can be used) for the duration of the COVID-19 declaration under Section 564(b)(1) of the Act, 21 U.S.C.section 360bbb-3(b)(1), unless the authorization is terminated  or revoked sooner.       Influenza A by PCR NEGATIVE NEGATIVE Final   Influenza B by PCR NEGATIVE NEGATIVE Final    Comment: (NOTE) The Xpert Xpress SARS-CoV-2/FLU/RSV plus assay is intended as an aid in the diagnosis of influenza from Nasopharyngeal swab specimens and should not be used as a sole basis for treatment. Nasal washings and aspirates are unacceptable for Xpert Xpress SARS-CoV-2/FLU/RSV testing.  Fact Sheet for Patients: EntrepreneurPulse.com.au  Fact Sheet for Healthcare Providers: IncredibleEmployment.be  This test is not yet approved or cleared by the [place] FDA and has been authorized for detection and/or diagnosis of SARS-CoV-2 by FDA under an Emergency Use Authorization (EUA). This EUA will remain in effect (meaning this test can be used) for the duration of the COVID-19 declaration under Section 564(b)(1) of the Act, 21 U.S.C. section 360bbb-3(b)(1), unless the authorization is terminated or revoked.  Performed at [facility], [place] [address] [place], [place] [address]   Resp Panel by RT-PCR (Flu A&B, Covid) Nasopharyngeal Swab     Status: Abnormal   Collection Time: 03/16/21  5:53 PM   Specimen: Nasopharyngeal Swab; Nasopharyngeal(NP) swabs in vial transport medium  Result Value Ref Range Status   SARS Coronavirus 2 by RT PCR POSITIVE (A) NEGATIVE Final    Comment: (NOTE) SARS-CoV-2 target nucleic acids are DETECTED.  The SARS-CoV-2 RNA is generally detectable in upper respiratory specimens during the acute phase of infection. Positive results are indicative  of the presence of the identified virus, but do not rule out bacterial infection or co-infection with other pathogens not detected by the test. Clinical correlation with patient history and other diagnostic information is necessary to determine patient infection status. The expected result is Negative.  Fact Sheet for Patients: EntrepreneurPulse.com.au  Fact Sheet for Healthcare Providers: IncredibleEmployment.be  This test is not yet approved or cleared by the [place] FDA and  has been authorized for detection and/or diagnosis of SARS-CoV-2 by FDA under an Emergency Use Authorization (EUA).  This EUA will remain in effect (meaning this test can be used) for the duration of  the COVID-19 declaration under Section 564(b)(1) of the A ct, 21 U.S.C. section 360bbb-3(b)(1), unless the authorization is terminated or revoked sooner.     Influenza A by PCR NEGATIVE NEGATIVE Final   Influenza B by PCR NEGATIVE NEGATIVE Final    Comment: (NOTE) The Xpert Xpress SARS-CoV-2/FLU/RSV plus assay is intended as an aid in the diagnosis of influenza from Nasopharyngeal  swab specimens and should not be used as a sole basis for treatment. Nasal washings and aspirates are unacceptable for Xpert Xpress SARS-CoV-2/FLU/RSV testing.  Fact Sheet for Patients: EntrepreneurPulse.com.au  Fact Sheet for Healthcare Providers: IncredibleEmployment.be  This test is not yet approved or cleared by the [place] FDA and has been authorized for detection and/or diagnosis of SARS-CoV-2 by FDA under an Emergency Use Authorization (EUA). This EUA will remain in effect (meaning this test can be used) for the duration of the COVID-19 declaration under Section 564(b)(1) of the Act, 21 U.S.C. section 360bbb-3(b)(1), unless the authorization is terminated or revoked.  Performed at [facility], [place] [address] [place],  [place] [address]      Labs: BNP (last 3 results) Recent Labs    03/16/21 1625  BNP 790.2*   Basic Metabolic Panel: Recent Labs  Lab 03/12/21 2044 03/12/21 2050 03/16/21 1623 03/16/21 1627  NA 136 142 136 138  K 3.9 4.0 3.5 3.5  CL 108 108 102 103  CO2 23  --  24  --   GLUCOSE 157* 150* 124* 122*  BUN 27* 31* 31* 34*  CREATININE 1.89* 1.80* 1.79* 1.80*  CALCIUM 9.0  --  8.9  --    Liver Function Tests: Recent Labs  Lab 03/12/21 2044 03/16/21 1623  AST 31 34  ALT 19 20  ALKPHOS 48 44  BILITOT 0.5 0.8  PROT 7.1 7.5  ALBUMIN 3.7 3.6   No results for input(s): LIPASE, AMYLASE in the last 168 hours. No results for input(s): AMMONIA in the last 168 hours. CBC: Recent Labs  Lab 03/12/21 2044 03/12/21 2050 03/16/21 1623 03/16/21 1627  WBC 5.7  --  6.0  --   NEUTROABS 3.0  --  3.3  --   HGB 13.4 14.6 13.6 15.0  HCT 41.7 43.0 43.7 44.0  MCV 92.3  --  91.8  --   PLT PLATELET CLUMPS NOTED ON SMEAR, UNABLE TO ESTIMATE  --  PLATELET CLUMPS NOTED ON SMEAR, UNABLE TO ESTIMATE  --    Cardiac Enzymes: No results for input(s): CKTOTAL, CKMB, CKMBINDEX, TROPONINI in the last 168 hours. BNP: Invalid input(s): POCBNP CBG: Recent Labs  Lab 03/12/21 2044 03/16/21 1621  GLUCAP 153* 111*   D-Dimer No results for input(s): DDIMER in the last 72 hours. Hgb A1c No results for input(s): HGBA1C in the last 72 hours. Lipid Profile Recent Labs    03/17/21 0354  CHOL 160  HDL 56  LDLCALC 89  TRIG 77  CHOLHDL 2.9   Thyroid function studies No results for input(s): TSH, T4TOTAL, T3FREE, THYROIDAB in the last 72 hours.  Invalid input(s): FREET3 Anemia work up No results for input(s): VITAMINB12, FOLATE, FERRITIN, TIBC, IRON, RETICCTPCT in the last 72 hours. Urinalysis    Component Value Date/Time   COLORURINE YELLOW 03/16/2021 1714   APPEARANCEUR CLEAR 03/16/2021 1714   LABSPEC 1.010 03/16/2021 1714   PHURINE 5.0 03/16/2021 1714   GLUCOSEU >=500 (A) 03/16/2021 1714    HGBUR TRACE (A) 03/16/2021 1714   BILIRUBINUR NEGATIVE 03/16/2021 1714   KETONESUR NEGATIVE 03/16/2021 1714   PROTEINUR NEGATIVE 03/16/2021 1714   NITRITE POSITIVE (A) 03/16/2021 1714   LEUKOCYTESUR SMALL (A) 03/16/2021 1714   Sepsis Labs Invalid input(s): PROCALCITONIN,  WBC,  LACTICIDVEN Microbiology Recent Results (from the past 240 hour(s))  Resp Panel by RT-PCR (Flu A&B, Covid) Nasopharyngeal Swab     Status: None   Collection Time: 03/12/21 10:33 PM   Specimen: Nasopharyngeal Swab;  Nasopharyngeal(NP) swabs in vial transport medium  Result Value Ref Range Status   SARS Coronavirus 2 by RT PCR NEGATIVE NEGATIVE Final    Comment: (NOTE) SARS-CoV-2 target nucleic acids are NOT DETECTED.  The SARS-CoV-2 RNA is generally detectable in upper respiratory specimens during the acute phase of infection. The lowest concentration of SARS-CoV-2 viral copies this assay can detect is 138 copies/mL. A negative result does not preclude SARS-Cov-2 infection and should not be used as the sole basis for treatment or other patient management decisions. A negative result may occur with  improper specimen collection/handling, submission of specimen other than nasopharyngeal swab, presence of viral mutation(s) within the areas targeted by this assay, and inadequate number of viral copies(<138 copies/mL). A negative result must be combined with clinical observations, patient history, and epidemiological information. The expected result is Negative.  Fact Sheet for Patients:  EntrepreneurPulse.com.au  Fact Sheet for Healthcare Providers:  IncredibleEmployment.be  This test is no t yet approved or cleared by the [place] FDA and  has been authorized for detection and/or diagnosis of SARS-CoV-2 by FDA under an Emergency Use Authorization (EUA). This EUA will remain  in effect (meaning this test can be used) for the duration of the COVID-19 declaration under  Section 564(b)(1) of the Act, 21 U.S.C.section 360bbb-3(b)(1), unless the authorization is terminated  or revoked sooner.       Influenza A by PCR NEGATIVE NEGATIVE Final   Influenza B by PCR NEGATIVE NEGATIVE Final    Comment: (NOTE) The Xpert Xpress SARS-CoV-2/FLU/RSV plus assay is intended as an aid in the diagnosis of influenza from Nasopharyngeal swab specimens and should not be used as a sole basis for treatment. Nasal washings and aspirates are unacceptable for Xpert Xpress SARS-CoV-2/FLU/RSV testing.  Fact Sheet for Patients: EntrepreneurPulse.com.au  Fact Sheet for Healthcare Providers: IncredibleEmployment.be  This test is not yet approved or cleared by the [place] FDA and has been authorized for detection and/or diagnosis of SARS-CoV-2 by FDA under an Emergency Use Authorization (EUA). This EUA will remain in effect (meaning this test can be used) for the duration of the COVID-19 declaration under Section 564(b)(1) of the Act, 21 U.S.C. section 360bbb-3(b)(1), unless the authorization is terminated or revoked.  Performed at [facility], [place] [address], [place], [place] [address]   Resp Panel by RT-PCR (Flu A&B, Covid) Nasopharyngeal Swab     Status: Abnormal   Collection Time: 03/16/21  5:53 PM   Specimen: Nasopharyngeal Swab; Nasopharyngeal(NP) swabs in vial transport medium  Result Value Ref Range Status   SARS Coronavirus 2 by RT PCR POSITIVE (A) NEGATIVE Final    Comment: (NOTE) SARS-CoV-2 target nucleic acids are DETECTED.  The SARS-CoV-2 RNA is generally detectable in upper respiratory specimens during the acute phase of infection. Positive results are indicative of the presence of the identified virus, but do not rule out bacterial infection or co-infection with other pathogens not detected by the test. Clinical correlation with patient history and other diagnostic information is necessary to determine  patient infection status. The expected result is Negative.  Fact Sheet for Patients: EntrepreneurPulse.com.au  Fact Sheet for Healthcare Providers: IncredibleEmployment.be  This test is not yet approved or cleared by the [place] FDA and  has been authorized for detection and/or diagnosis of SARS-CoV-2 by FDA under an Emergency Use Authorization (EUA).  This EUA will remain in effect (meaning this test can be used) for the duration of  the COVID-19 declaration under Section 564(b)(1) of the  A ct, 21 U.S.C. section 360bbb-3(b)(1), unless the authorization is terminated or revoked sooner.     Influenza A by PCR NEGATIVE NEGATIVE Final   Influenza B by PCR NEGATIVE NEGATIVE Final    Comment: (NOTE) The Xpert Xpress SARS-CoV-2/FLU/RSV plus assay is intended as an aid in the diagnosis of influenza from Nasopharyngeal swab specimens and should not be used as a sole basis for treatment. Nasal washings and aspirates are unacceptable for Xpert Xpress SARS-CoV-2/FLU/RSV testing.  Fact Sheet for Patients: EntrepreneurPulse.com.au  Fact Sheet for Healthcare Providers: IncredibleEmployment.be  This test is not yet approved or cleared by the [place] FDA and has been authorized for detection and/or diagnosis of SARS-CoV-2 by FDA under an Emergency Use Authorization (EUA). This EUA will remain in effect (meaning this test can be used) for the duration of the COVID-19 declaration under Section 564(b)(1) of the Act, 21 U.S.C. section 360bbb-3(b)(1), unless the authorization is terminated or revoked.  Performed at [facility], [place] [address], [place], [place] [address]      Time coordinating discharge: Over 30 minutes  SIGNED:   Phillips Climes, MD  [facility] 03/17/2021, 2:52 PM Pager   If 7PM-7AM, please contact night-coverage [email or web address] Password [email or web address]

## 2021-03-17 NOTE — ED Provider Notes (Addendum)
Contacted by triage staff that the patient is wife is very concerned that he is having a stroke.  At initial screening although the patient does have neurologic abnormalities I reviewed his chart and saw that within the past 5 days the patient has had a head CT MRI brain MRA of the brain had a repeat head CT yesterday a CT angiogram with perfusion scan yesterday and an MRI of the brain yesterday all of which were normal.  At initial triage I discussed that I did not think that given the fact that he has recurrence of the same symptoms and negative imaging that initiation of a code stroke was appropriate.  Because of the persistence of the patient's wife's worry I have discussed the case with Dr. Karle Starch and ordered a CT head.  9:46 PM BP 135/76 (BP Location: Left Arm)    Pulse (!) 38    Temp 97.7 F (36.5 C) (Oral)    Resp 16    SpO2 95%  I discussed the case with Dr. Lorrin Goodell.  He has not seen the patient however we discussed his presenting symptoms today and all recent work-up.  Dr. Lorrin Goodell took some time to review all of his charts, his leeks notes and images.  These are the same symptoms the patient presented with yesterday with code stroke.  Given these facts he agrees that it is unlikely we needed to call a code stroke again today however we may proceed with head CT imaging which I have ordered.  Patient may also need reevaluation for strength and skilled nursing facility placement.       Margarita Mail, PA-C 03/17/21 2147    Truddie Hidden, MD 03/17/21 [phone]

## 2021-03-17 NOTE — ED Notes (Signed)
Daughter demanding that her dad be re asseed d/t him stating he feels like he is going to faint. CT called to notify that he is ready for CT

## 2021-03-17 NOTE — ED Provider Notes (Signed)
Emergency Medicine Provider Triage Evaluation Note  Shafter Jupin , a 87 y.o. male  was evaluated in triage.  Pt complains of slurred speech, disequilibrium and shortness of breath.  Patient is positive for COVID-19.  He was admitted with TIA-like symptoms, released.  Presented again this past Sunday with the same symptoms and was observed overnight discharged today at 3:00.  He went home with his wife however he has returned due to worsening in his symptoms.  He is unable to walk steadily and has had persistent slurred speech..  Review of Systems  Positive: sob Negative: fever  Physical Exam  There were no vitals taken for this visit. Gen:   Awake, no distress   Resp:  Normal effort  MSK:   Moves extremities without difficulty  Other:  Slurred speech sob  Medical Decision Making  Medically screening exam initiated at 6:33 PM.  Appropriate orders placed.  Benzion Mesta was informed that the remainder of the evaluation will be completed by another provider, this initial triage assessment does not replace that evaluation, and the importance of remaining in the ED until their evaluation is complete.  Work up initiated   Margarita Mail, PA-C 03/17/21 1836    Truddie Hidden, MD 03/17/21 2238

## 2021-03-17 NOTE — Evaluation (Signed)
Occupational Therapy Evaluation Patient Details Name: Robert Horn MRN: [id number] DOB: Jan 07, 1934 Today's Date: 03/17/2021   History of Present Illness 87 yo male brought to  ED 12/18 after recent arrival with facial weakenss and speech changes.  Currently has new speech  changes and has now stenosis of MCA and bacteruria.  PMHx:  TIA, TAVR, Colon cancer, CHF, CKD, CAD, DM II, HTN, PVD, neuropathy.   Clinical Impression   Pt presents with slight balance deficits, however safe/independent with ADLs. Reports he feels he is at his baseline for occupational performance and has no concerns. Plans to follow up with HHPT to address balance. No further skilled OT needs at this time. Will sign off.      Recommendations for follow up therapy are one component of a multi-disciplinary discharge planning process, led by the attending physician.  Recommendations may be updated based on patient status, additional functional criteria and insurance authorization.   Follow Up Recommendations  No OT follow up    Assistance Recommended at Discharge None  Functional Status Assessment  Patient has not had a recent decline in their functional status  Equipment Recommendations  None recommended by OT    Recommendations for Other Services       Precautions / Restrictions Precautions Precautions: Fall (Simultaneous filing. User may not have seen previous data.) Precaution Comments: monitor for pulses and sats, BP Restrictions Weight Bearing Restrictions: No (Simultaneous filing. User may not have seen previous data.)      Mobility Bed Mobility Overal bed mobility: Modified Independent             General bed mobility comments: HOB elevated    Transfers Overall transfer level: Modified independent Equipment used: None                      Balance Overall balance assessment: Needs assistance Sitting-balance support: Feet supported Sitting balance-Leahy Scale: Good     Standing  balance support: During functional activity;Single extremity supported Standing balance-Leahy Scale: Fair Standing balance comment: higher level balance skills require a minor support                           ADL either performed or assessed with clinical judgement   ADL Overall ADL's : Independent                                             Vision Patient Visual Report: No change from baseline       Perception     Praxis      Pertinent Vitals/Pain Pain Assessment: No/denies pain     Hand Dominance Right   Extremity/Trunk Assessment Upper Extremity Assessment Upper Extremity Assessment: Defer to OT evaluation   Lower Extremity Assessment Lower Extremity Assessment: LLE deficits/detail LLE Deficits / Details: balance changes LLE Coordination: decreased gross motor   Cervical / Trunk Assessment Cervical / Trunk Assessment: Normal   Communication Communication Communication: HOH   Cognition Arousal/Alertness: Awake/alert Behavior During Therapy: WFL for tasks assessed/performed Overall Cognitive Status: Within Functional Limits for tasks assessed                                       General Comments  Pt requires min guard to stand in  a narrow base posture and min assist to capture balance with single leg balance skills    Exercises Exercises: Other exercises (LE strength is grossly [facility])   Shoulder Instructions      Home Living Family/patient expects to be discharged to:: Private residence Living Arrangements: Spouse/significant other Available Help at Discharge: Available 24 hours/day;Family Type of Home: House Home Access: Stairs to enter [employer] of Steps: front: 3 large steps (deep); back: 1, 2, and 2 steps to deck Entrance Stairs-Rails: None Home Layout: Multi-level;Able to live on main level with bedroom/bathroom     Bathroom Shower/Tub: Occupational psychologist:  Standard Bathroom Accessibility: Yes   Home Equipment: BSC/3in1;Shower seat;Cane - single point;Rolling Walker (2 wheels);Electric scooter          Prior Functioning/Environment Prior Level of Function : Independent/Modified Independent;Working/employed;Driving               ADLs Comments: drives, works as an Geophysicist/field seismologist Problem List:        OT Treatment/Interventions:      OT Goals(Current goals can be found in the care plan section) Acute Rehab OT Goals Patient Stated Goal: return home OT Goal Formulation: All assessment and education complete, DC therapy  OT Frequency:     Barriers to D/Horn:            Co-evaluation              AM-PAC OT "6 Clicks" Daily Activity     Outcome Measure Help from another person eating meals?: None Help from another person taking care of personal grooming?: None Help from another person toileting, which includes using toliet, bedpan, or urinal?: None Help from another person bathing (including washing, rinsing, drying)?: None Help from another person to put on and taking off regular upper body clothing?: None Help from another person to put on and taking off regular lower body clothing?: None 6 Click Score: 24   End of Session Nurse Communication: Mobility status  Activity Tolerance: Patient tolerated treatment well Patient left: in bed;with call bell/phone within reach  OT Visit Diagnosis: Unsteadiness on feet (R26.81)                Time: 4492-0100 OT Time Calculation (min): 23 min Charges:  OT General Charges $OT Visit: 1 Visit OT Evaluation $OT Eval Low Complexity: 1 Low  Robert Horn, OT/L  Acute Rehab [place] 03/17/2021, 1:52 PM

## 2021-03-17 NOTE — Evaluation (Signed)
Physical Therapy Evaluation Patient Details Name: Robert Horn MRN: [id number] DOB: 06-29-1933 Today's Date: 03/17/2021  History of Present Illness  87 yo male brought to  ED 12/18 after recent arrival with facial weakenss and speech changes.  Currently has new speech  changes and has now stenosis of MCA and bacteruria.  PMHx:  TIA, TAVR, Colon cancer, CHF, CKD, CAD, DM II, HTN, PVD, neuropathy.  Clinical Impression  Pt was seen for mobility in the room given precautions, noted BP and sats were stable with slight drop to walk to 91% but quick recovery to 97%.  Pt is up with some high level balance changes that were evident with narrow base of support, esp to do step tap from [employer].  Pt is motivated to work on balance, agreed to having therapy at home and then follow up with outpatient skills.  Will request these services, and will not anticipate him needing further equipment due to extensive home list.  Follow for acute PT goals, practice high level balance and increase gait distances as his stay and condition permits.       Recommendations for follow up therapy are one component of a multi-disciplinary discharge planning process, led by the attending physician.  Recommendations may be updated based on patient status, additional functional criteria and insurance authorization.  Follow Up Recommendations Home health PT    Assistance Recommended at Discharge Intermittent Supervision/Assistance  Functional Status Assessment Patient has had a recent decline in their functional status and demonstrates the ability to make significant improvements in function in a reasonable and predictable amount of time.  Equipment Recommendations  None recommended by PT    Recommendations for Other Services       Precautions / Restrictions Precautions Precautions: Fall (Simultaneous filing. User may not have seen previous data.) Precaution Comments: monitor for pulses and sats, BP Restrictions Weight  Bearing Restrictions: No (Simultaneous filing. User may not have seen previous data.)      Mobility  Bed Mobility Overal bed mobility: Modified Independent             General bed mobility comments: HOB elevated    Transfers Overall transfer level: Modified independent Equipment used: None                    Ambulation/Gait Ambulation/Gait assistance: Supervision Gait Distance (Feet): 20 Feet Assistive device: None Gait Pattern/deviations: Step-through pattern;Decreased stance time - left;Wide base of support     Pre-gait activities: standing balance testing General Gait Details: mild changes in gait on no AD, widens base to compensate the LLE balance change  Stairs            Wheelchair Mobility    Modified Rankin (Stroke Patients Only) Modified Rankin (Stroke Patients Only) Pre-Morbid Rankin Score: No symptoms Modified Rankin: Slight disability     Balance Overall balance assessment: Needs assistance Sitting-balance support: Feet supported Sitting balance-Leahy Scale: Good     Standing balance support: During functional activity;Single extremity supported Standing balance-Leahy Scale: Fair Standing balance comment: higher level balance skills require a minor support                             Pertinent Vitals/Pain Pain Assessment: No/denies pain    Home Living Family/patient expects to be discharged to:: Private residence Living Arrangements: Spouse/significant other Available Help at Discharge: Available 24 hours/day;Family Type of Home: House Home Access: Stairs to enter Entrance Stairs-Rails: None Entrance Stairs-Number of  Steps: front: 3 large steps (deep); back: 1, 2, and 2 steps to deck   Home Layout: Multi-level;Able to live on main level with bedroom/bathroom Home Equipment: BSC/3in1;Shower seat;Cane - single point;Rolling Walker (2 wheels);Electric scooter      Prior Function Prior Level of Function :  Independent/Modified Independent;Working/employed;Driving               ADLs Comments: drives, works as an Set designer: Right    Extremity/Trunk Assessment   Upper Extremity Assessment Upper Extremity Assessment: Defer to OT evaluation    Lower Extremity Assessment Lower Extremity Assessment: LLE deficits/detail LLE Deficits / Details: balance changes LLE Coordination: decreased gross motor    Cervical / Trunk Assessment Cervical / Trunk Assessment: Normal  Communication   Communication: HOH  Cognition Arousal/Alertness: Awake/alert Behavior During Therapy: WFL for tasks assessed/performed Overall Cognitive Status: Within Functional Limits for tasks assessed                                          General Comments General comments (skin integrity, edema, etc.): Pt requires min guard to stand in a narrow base posture and min assist to capture balance with single leg balance skills    Exercises     Assessment/Plan    PT Assessment Patient needs continued PT services  PT Problem List Decreased balance;Decreased mobility;Decreased coordination       PT Treatment Interventions Gait training;Functional mobility training;Therapeutic activities;Therapeutic exercise;Balance training;Neuromuscular re-education;Patient/family education    PT Goals (Current goals can be found in the Care Plan section)  Acute Rehab PT Goals Patient Stated Goal: prep for retirement PT Goal Formulation: With patient Time For Goal Achievement: 03/24/21 Potential to Achieve Goals: Good    Frequency Min 3X/week   Barriers to discharge Decreased caregiver support home with wife and will need therapy to make him fully safe    Co-evaluation               AM-PAC PT "6 Clicks" Mobility  Outcome Measure Help needed turning from your back to your side while in a flat bed without using bedrails?: None Help needed moving from lying on  your back to sitting on the side of a flat bed without using bedrails?: None Help needed moving to and from a bed to a chair (including a wheelchair)?: A Little Help needed standing up from a chair using your arms (e.g., wheelchair or bedside chair)?: A Little Help needed to walk in hospital room?: A Little Help needed climbing 3-5 steps with a railing? : A Lot 6 Click Score: 19    End of Session Equipment Utilized During Treatment: Gait belt Activity Tolerance: Patient tolerated treatment well Patient left: in bed;with call bell/phone within reach Nurse Communication: Mobility status PT Visit Diagnosis: Other symptoms and signs involving the nervous system (R29.898)    Time: 3976-7341 PT Time Calculation (min) (ACUTE ONLY): 24 min   Charges:   PT Evaluation $PT Eval Moderate Complexity: 1 Mod PT Treatments $Neuromuscular Re-education: 8-22 mins       Ramond Dial 03/17/2021, 1:33 PM  Mee Hives, PT PhD Acute Rehab Dept. Number: [place] [facility] [place]

## 2021-03-17 NOTE — ED Notes (Signed)
Pt discharge information explained to the pt and his wife. No questions

## 2021-03-17 NOTE — Discharge Instructions (Signed)
Follow with Primary MD [name], MD in 10 days   Get CBC, CMP,  checked  by Primary MD next visit.    Activity: As tolerated with Full fall precautions use walker/cane & assistance as needed   Disposition Home    Diet: Heart Healthy  , with feeding assistance and aspiration precautions.    On your next visit with your primary care physician please Get Medicines reviewed and adjusted.   Please request your Prim.MD to go over all Hospital Tests and Procedure/Radiological results at the follow up, please get all Hospital records sent to your Prim MD by signing hospital release before you go home.   If you experience worsening of your admission symptoms, develop shortness of breath, life threatening emergency, suicidal or homicidal thoughts you must seek medical attention immediately by calling 911 or calling your MD immediately  if symptoms less severe.  You Must read complete instructions/literature along with all the possible adverse reactions/side effects for all the Medicines you take and that have been prescribed to you. Take any new Medicines after you have completely understood and accpet all the possible adverse reactions/side effects.   Do not drive, operating heavy machinery, perform activities at heights, swimming or participation in water activities or provide baby sitting services if your were admitted for syncope or siezures until you have seen by Primary MD or a Neurologist and advised to do so again.  Do not drive when taking Pain medications.    Do not take more than prescribed Pain, Sleep and Anxiety Medications  Special Instructions: If you have smoked or chewed Tobacco  in the last 2 yrs please stop smoking, stop any regular Alcohol  and or any Recreational drug use.  Wear Seat belts while driving.   Please note  You were cared for by a hospitalist during your hospital stay. If you have any questions about your discharge medications or the care you  received while you were in the hospital after you are discharged, you can call the unit and asked to speak with the hospitalist on call if the hospitalist that took care of you is not available. Once you are discharged, your primary care physician will handle any further medical issues. Please note that NO REFILLS for any discharge medications will be authorized once you are discharged, as it is imperative that you return to your primary care physician (or establish a relationship with a primary care physician if you do not have one) for your aftercare needs so that they can reassess your need for medications and monitor your lab values.

## 2021-03-18 ENCOUNTER — Other Ambulatory Visit (HOSPITAL_COMMUNITY): Payer: Medicare HMO

## 2021-03-18 ENCOUNTER — Ambulatory Visit: Payer: Medicare HMO | Admitting: Orthopedic Surgery

## 2021-03-18 DIAGNOSIS — G459 Transient cerebral ischemic attack, unspecified: Secondary | ICD-10-CM | POA: Diagnosis not present

## 2021-03-18 DIAGNOSIS — R410 Disorientation, unspecified: Secondary | ICD-10-CM | POA: Diagnosis not present

## 2021-03-18 DIAGNOSIS — U071 COVID-19: Secondary | ICD-10-CM | POA: Diagnosis not present

## 2021-03-18 LAB — URINE CULTURE: Culture: >=100000 — AB

## 2021-03-18 NOTE — ED Notes (Addendum)
Pt family said they could no longer wait. Pt family said they were taking pt home. Pt left AMA

## 2021-03-19 ENCOUNTER — Encounter: Payer: Self-pay | Admitting: Interventional Cardiology

## 2021-03-31 NOTE — Progress Notes (Signed)
Cardiology Office Note:    Date:  04/02/2021   ID:  Robert Horn, DOB 01-02-1934, MRN [id number]  PCP:  [name], MD  Cardiologist:  Sinclair Grooms, MD   Referring MD: [name], MD   Chief Complaint  Patient presents with   Follow-up    CVA   Advice Only    Medication side effect    History of Present Illness:    Robert Horn is a 88 y.o. male with a hx of chronic combined S/D CHF, HTN, HLD, colon cancer, s/p TAVR (12/15/16), chronic combined systolic and diastolic HF (EF to 28-41% with mean AV gradient of 12 mmmHg),  S/P multiple amputations right foot, and recent Covid 19 (02/2021).  Two recent encounters for transient neurological impairment with the institution of DAPT -> Ticagrelor and ASA.Marland Kitchen  Since starting Brilinta that he has had shortness of breath and a feeling of anxiety.  It is more prevalent shortly after taking Brilinta than it is several hours after a dose.  He denies orthopnea lower extremity swelling.  No new neurological complaints.  He feels unable to tolerate Brilinta therapy as it is impacting his quality of life.  He otherwise is taking his medications as listed.  Past Medical History:  Diagnosis Date   Anemia    Anxiety    situational- surgery   Asthma    as a child   Cancer ([place]) 2019   colon- colectomy    CHF (congestive heart failure) ([place])    Chronic kidney disease    followed by Dr. Delfina Redwood   Coronary artery disease    Diabetes mellitus without complication ([place])    Type II   Dyslipidemia 10/27/2015   Dyspnea    w/ exertion    Elevated PSA 10/27/2015   Erectile dysfunction 10/27/2015   Heart murmur    Hypertension 10/27/2015   Hypogonadism male 10/27/2015   Neuropathy    Obesity 10/27/2015   Peripheral vascular disease ([place])    Pneumonia 10/27/2015   pt states was 1982   Rotator cuff tear 10/27/2015    Past Surgical History:  Procedure Laterality Date   ABDOMINAL AORTOGRAM N/A 02/01/2019   Procedure: ABDOMINAL  AORTOGRAM;  Surgeon: Marty Heck, MD;  Location: [facility];  Service: Cardiovascular;  Laterality: N/A;   AMPUTATION Right 02/03/2019   Procedure: RIGHT FOOT 1ST RAY AMPUTATION;  Surgeon: [name], MD;  Location: [facility];  Service: Orthopedics;  Laterality: Right;   AMPUTATION Right 08/02/2019   Procedure: RIGHT 2ND TOE AMPUTATION;  Surgeon: [name], MD;  Location: [place];  Service: Orthopedics;  Laterality: Right;   AMPUTATION Right 11/08/2020   Procedure: RIGHT TRANSMETATARSAL AMPUTATION;  Surgeon: [name], MD;  Location: [place];  Service: Orthopedics;  Laterality: Right;   APPLICATION OF WOUND VAC  11/08/2020   Procedure: APPLICATION OF WOUND VAC;  Surgeon: [name], MD;  Location: [facility] [place];  Service: Orthopedics;;   CARDIAC CATHETERIZATION N/A 11/14/2015   Procedure: Left Heart Cath and Coronary Angiography;  Surgeon: [name], MD;  Location: [facility];  Service: Cardiovascular;  Laterality: N/A;   COLONOSCOPY     LAPAROSCOPIC PARTIAL COLECTOMY  12/15/2017   LAPAROSCOPIC PARTIAL COLECTOMY (N/A Abdomen)   LAPAROSCOPIC PARTIAL COLECTOMY N/A 12/15/2017   Procedure: LAPAROSCOPIC PARTIAL COLECTOMY;  Surgeon: Erroll Luna, MD;  Location: [facility];  Service: General;  Laterality: N/A;   LOWER EXTREMITY ANGIOGRAPHY Right 02/01/2019   Procedure: LOWER EXTREMITY ANGIOGRAPHY;  Surgeon: Marty Heck, MD;  Location: [facility];  Service: Cardiovascular;  Laterality: Right;   PERIPHERAL VASCULAR INTERVENTION Right 02/01/2019   Procedure: PERIPHERAL VASCULAR INTERVENTION;  Surgeon: Marty Heck, MD;  Location: [facility];  Service: Cardiovascular;  Laterality: Right;  SFA   TEE WITHOUT CARDIOVERSION N/A 12/15/2016   Procedure: TRANSESOPHAGEAL ECHOCARDIOGRAM (TEE);  Surgeon: Sherren Mocha, MD;  Location: [facility];  Service: Open Heart Surgery;  Laterality: N/A;   TRANSCATHETER AORTIC VALVE REPLACEMENT, TRANSFEMORAL N/A 12/15/2016    Procedure: TRANSCATHETER AORTIC VALVE REPLACEMENT, TRANSFEMORAL;  Surgeon: Sherren Mocha, MD;  Location: [facility];  Service: Open Heart Surgery;  Laterality: N/A;    Current Medications: Current Meds  Medication Sig   amLODipine (NORVASC) 10 MG tablet Take 10 mg by mouth daily.   aspirin 81 MG EC tablet Take 81 mg by mouth daily. Swallow whole.   carvedilol (COREG) 3.125 MG tablet TAKE 1 TABLET (3.125 MG TOTAL) BY MOUTH 2 (TWO) TIMES DAILY.   Cholecalciferol (VITAMIN D) 50 MCG (2000 UT) tablet Take 2,000 Units by mouth daily.   [START ON 04/14/2021] clopidogrel (PLAVIX) 75 MG tablet Take 1 tablet (75 mg total) by mouth daily.   ferrous sulfate 325 (65 FE) MG tablet Take 325 mg by mouth daily with breakfast.   finasteride (PROSCAR) 5 MG tablet Take 5 mg by mouth daily.   furosemide (LASIX) 40 MG tablet TAKE 1.5 TABLETS DAILY.   JARDIANCE 25 MG TABS tablet Take 25 mg by mouth daily.   LOVAZA 1 g capsule TAKE 1 CAPSULE BY MOUTH EVERY DAY   Multiple Vitamin (MULTIVITAMIN WITH MINERALS) TABS tablet Take 1 tablet by mouth daily.   nitroGLYCERIN (NITRODUR - DOSED IN MG/24 HR) 0.2 mg/hr patch Place 1 patch (0.2 mg total) onto the skin daily.   pentoxifylline (TRENTAL) 400 MG CR tablet TAKE 1 TABLET BY MOUTH 3 TIMES DAILY WITH MEALS.   rosuvastatin (CRESTOR) 20 MG tablet Take 20 mg by mouth daily.   sacubitril-valsartan (ENTRESTO) 49-51 MG Take 1 tablet by mouth 2 (two) times daily.   tamsulosin (FLOMAX) 0.4 MG CAPS capsule Take 0.4 mg by mouth daily.   VITAMIN B COMPLEX-C PO Take 1 tablet by mouth daily.   [DISCONTINUED] ticagrelor (BRILINTA) 90 MG TABS tablet Take 1 tablet (90 mg total) by mouth 2 (two) times daily.     Allergies:   Patient has no known allergies.   Social History   Socioeconomic History   Marital status: Married    Spouse name: Not on file   Number of children: Not on file   Years of education: Not on file   Highest education level: Not on file  Occupational History    Not on file  Tobacco Use   Smoking status: Former    Types: Cigarettes   Smokeless tobacco: Never  Vaping Use   Vaping Use: Never used  Substance and Sexual Activity   Alcohol use: Yes    Comment: rarely   Drug use: No   Sexual activity: Not on file  Other Topics Concern   Not on file  Social History Narrative   Not on file   Social Determinants of Health   Financial Resource Strain: Not on file  Food Insecurity: Not on file  Transportation Needs: Not on file  Physical Activity: Not on file  Stress: Not on file  Social Connections: Not on file     Family History: The patient's family history includes Diabetes in his mother;  Heart disease in his mother; Pulmonary embolism in his father.  ROS:   Please see the history of present illness.    Has pain in the stump on his right foot.  States that surgery is still limited due to neuropathy.  All other systems reviewed and are negative.  EKGs/Labs/Other Studies Reviewed:    The following studies were reviewed today:  No new imaging data other than echocardiogram performed 03/13/2021: IMPRESSIONS     1. Left ventricular ejection fraction, by estimation, is 45 to 50%. The  left ventricle has mildly decreased function. The left ventricle  demonstrates global hypokinesis. The left ventricular internal cavity size  was mildly dilated. Left ventricular  diastolic parameters are consistent with Grade I diastolic dysfunction  (impaired relaxation).   2. Right ventricular systolic function is normal. The right ventricular  size is normal. There is normal pulmonary artery systolic pressure.   3. Left atrial size was severely dilated.   4. The mitral valve is normal in structure. Trivial mitral valve  regurgitation. No evidence of mitral stenosis.   5. Aortic valve gradients are moderately elevated, unchanged from  01/2019. Deceleration time 81 msec, consistent with a normally funcitoning  prosthetic valve. The aortic valve has  been repaired/replaced. Aortic  valve regurgitation is not visualized. No  aortic stenosis is present. Aortic valve area, by VTI measures 0.52 cm.  Aortic valve mean gradient measures 24.5 mmHg. Aortic valve Vmax measures  3.26 m/s.   6. The inferior vena cava is normal in size with greater than 50%  respiratory variability, suggesting right atrial pressure of 3 mmHg.    EKG:  EKG performed on 03/18/2021 demonstrates normal sinus rhythm, inferior Q waves, incomplete right bundle branch block.  No change from prior  Recent Labs: 03/16/2021: ALT 20 03/17/2021: B Natriuretic Peptide 102.2; BUN 31; Creatinine, Ser 1.72; Hemoglobin 13.9; Platelets PLATELET CLUMPS NOTED ON SMEAR, UNABLE TO ESTIMATE; Potassium 3.4; Sodium 139  Recent Lipid Panel    Component Value Date/Time   CHOL 160 03/17/2021 0354   TRIG 77 03/17/2021 0354   HDL 56 03/17/2021 0354   CHOLHDL 2.9 03/17/2021 0354   VLDL 15 03/17/2021 0354   LDLCALC 89 03/17/2021 0354    Physical Exam:    VS:  BP 122/60    Pulse 69    Ht 6' (1.829 m)    Wt 205 lb (93 kg)    SpO2 96%    BMI 27.80 kg/m     Wt Readings from Last 3 Encounters:  04/02/21 205 lb (93 kg)  03/12/21 227 lb 8.2 oz (103.2 kg)  02/24/21 229 lb 8 oz (104.1 kg)     GEN: Obese. No acute distress HEENT: Normal NECK: No JVD. LYMPHATICS: No lymphadenopathy CARDIAC: 2/6 systolic without diastolic murmur. RRR no gallop, or edema. VASCULAR:  Normal Pulses. No bruits. RESPIRATORY:  Clear to auscultation without rales, wheezing or rhonchi  ABDOMEN: Soft, non-tender, non-distended, No pulsatile mass, MUSCULOSKELETAL: Amputation of all digits from right foot. SKIN: Warm and dry NEUROLOGIC:  Alert and oriented x 3 PSYCHIATRIC:  Normal affect   ASSESSMENT:    1. Chronic combined systolic and diastolic heart failure ([place])   2. PAD (peripheral artery disease) ([place])   3. TIA (transient ischemic attack)   4. S/P TAVR (transcatheter aortic valve replacement)   5.  Essential hypertension   6. Mixed hyperlipidemia   7. CKD (chronic kidney disease) stage 2, GFR 60-89 ml/min    PLAN:    In order of  problems listed above:  No evidence of volume overload.  Dyspnea is likely related to ticagrelor side effect.  Is uncomfortable and having poor quality of life related to this side effect.  We will switch back to Plavix. Continue aspirin and Plavix. Ticagrelor is being stopped early due to unacceptable side effects of hyperpnea/dyspnea related to ticagrelor. Normally functioning valve based upon the most recent echo. Blood pressures are tolerable. Continue statin therapy Monitor kidney function   Medication Adjustments/Labs and Tests Ordered: Current medicines are reviewed at length with the patient today.  Concerns regarding medicines are outlined above.  No orders of the defined types were placed in this encounter.  No orders of the defined types were placed in this encounter.   Patient Instructions  Medication Instructions:  1) DISCONTINUE Brilinta 2) START Plavix 75mg  once daily  *If you need a refill on your cardiac medications before your next appointment, please call your pharmacy*   Lab Work: None If you have labs (blood work) drawn today and your tests are completely normal, you will receive your results only by: [place] (if you have MyChart) OR A paper copy in the mail If you have any lab test that is abnormal or we need to change your treatment, we will call you to review the results.   Testing/Procedures: None   Follow-Up: At [facility], you and your health needs are our priority.  As part of our continuing mission to provide you with exceptional heart care, we have created designated Provider Care Teams.  These Care Teams include your primary Cardiologist (physician) and Advanced Practice Providers (APPs -  Physician Assistants and Nurse Practitioners) who all work together to provide you with the care you need, when  you need it.  We recommend signing up for the patient portal called "MyChart".  Sign up information is provided on this After Visit Summary.  MyChart is used to connect with patients for Virtual Visits (Telemedicine).  Patients are able to view lab/test results, encounter notes, upcoming appointments, etc.  Non-urgent messages can be sent to your provider as well.   To learn more about what you can do with MyChart, go to [email or web address].    Your next appointment:   2-3 month(s)  The format for your next appointment:   In Person  Provider:   Sinclair Grooms, MD     Other Instructions     Signed, Sinclair Grooms, MD  04/02/2021 2:39 PM    [place]

## 2021-04-02 ENCOUNTER — Ambulatory Visit: Payer: Medicare HMO | Admitting: Interventional Cardiology

## 2021-04-02 ENCOUNTER — Other Ambulatory Visit: Payer: Self-pay

## 2021-04-02 ENCOUNTER — Encounter: Payer: Self-pay | Admitting: Interventional Cardiology

## 2021-04-02 VITALS — BP 122/60 | HR 69 | Ht 72.0 in | Wt 205.0 lb

## 2021-04-02 DIAGNOSIS — N182 Chronic kidney disease, stage 2 (mild): Secondary | ICD-10-CM

## 2021-04-02 DIAGNOSIS — E782 Mixed hyperlipidemia: Secondary | ICD-10-CM

## 2021-04-02 DIAGNOSIS — G459 Transient cerebral ischemic attack, unspecified: Secondary | ICD-10-CM | POA: Diagnosis not present

## 2021-04-02 DIAGNOSIS — Z952 Presence of prosthetic heart valve: Secondary | ICD-10-CM | POA: Diagnosis not present

## 2021-04-02 DIAGNOSIS — I739 Peripheral vascular disease, unspecified: Secondary | ICD-10-CM | POA: Diagnosis not present

## 2021-04-02 DIAGNOSIS — I5042 Chronic combined systolic (congestive) and diastolic (congestive) heart failure: Secondary | ICD-10-CM | POA: Diagnosis not present

## 2021-04-02 DIAGNOSIS — I10 Essential (primary) hypertension: Secondary | ICD-10-CM | POA: Diagnosis not present

## 2021-04-02 NOTE — Patient Instructions (Signed)
Medication Instructions:  1) DISCONTINUE Brilinta 2) START Plavix 75mg  once daily  *If you need a refill on your cardiac medications before your next appointment, please call your pharmacy*   Lab Work: None If you have labs (blood work) drawn today and your tests are completely normal, you will receive your results only by: [place] (if you have MyChart) OR A paper copy in the mail If you have any lab test that is abnormal or we need to change your treatment, we will call you to review the results.   Testing/Procedures: None   Follow-Up: At [facility], you and your health needs are our priority.  As part of our continuing mission to provide you with exceptional heart care, we have created designated Provider Care Teams.  These Care Teams include your primary Cardiologist (physician) and Advanced Practice Providers (APPs -  Physician Assistants and Nurse Practitioners) who all work together to provide you with the care you need, when you need it.  We recommend signing up for the patient portal called "MyChart".  Sign up information is provided on this After Visit Summary.  MyChart is used to connect with patients for Virtual Visits (Telemedicine).  Patients are able to view lab/test results, encounter notes, upcoming appointments, etc.  Non-urgent messages can be sent to your provider as well.   To learn more about what you can do with MyChart, go to [email or web address].    Your next appointment:   2-3 month(s)  The format for your next appointment:   In Person  Provider:   Sinclair Grooms, MD     Other Instructions

## 2021-04-03 DIAGNOSIS — Z8616 Personal history of COVID-19: Secondary | ICD-10-CM | POA: Diagnosis not present

## 2021-04-03 DIAGNOSIS — N1831 Chronic kidney disease, stage 3a: Secondary | ICD-10-CM | POA: Diagnosis not present

## 2021-04-03 DIAGNOSIS — Z952 Presence of prosthetic heart valve: Secondary | ICD-10-CM | POA: Diagnosis not present

## 2021-04-03 DIAGNOSIS — I5040 Unspecified combined systolic (congestive) and diastolic (congestive) heart failure: Secondary | ICD-10-CM | POA: Diagnosis not present

## 2021-04-03 DIAGNOSIS — E1122 Type 2 diabetes mellitus with diabetic chronic kidney disease: Secondary | ICD-10-CM | POA: Diagnosis not present

## 2021-04-03 DIAGNOSIS — R69 Illness, unspecified: Secondary | ICD-10-CM | POA: Diagnosis not present

## 2021-04-03 DIAGNOSIS — I10 Essential (primary) hypertension: Secondary | ICD-10-CM | POA: Diagnosis not present

## 2021-04-03 DIAGNOSIS — G459 Transient cerebral ischemic attack, unspecified: Secondary | ICD-10-CM | POA: Diagnosis not present

## 2021-04-03 DIAGNOSIS — Z7984 Long term (current) use of oral hypoglycemic drugs: Secondary | ICD-10-CM | POA: Diagnosis not present

## 2021-04-07 ENCOUNTER — Ambulatory Visit (INDEPENDENT_AMBULATORY_CARE_PROVIDER_SITE_OTHER): Payer: Medicare HMO | Admitting: Orthopedic Surgery

## 2021-04-07 ENCOUNTER — Encounter: Payer: Self-pay | Admitting: Orthopedic Surgery

## 2021-04-07 DIAGNOSIS — L97511 Non-pressure chronic ulcer of other part of right foot limited to breakdown of skin: Secondary | ICD-10-CM

## 2021-04-07 DIAGNOSIS — Z89431 Acquired absence of right foot: Secondary | ICD-10-CM | POA: Diagnosis not present

## 2021-04-07 NOTE — Progress Notes (Signed)
Office Visit Note   Patient: Robert Horn           Date of Birth: 1933-08-09           MRN: [id number] Visit Date: 04/07/2021              Requested by: [name], MD [address],  [place] [address] PCP: [name], MD  Chief Complaint  Patient presents with   Right Foot - Follow-up    11/08/20 right foot transmet amputation       HPI: Patient is an 88 year old gentleman who presents in follow-up for right transmetatarsal amputation.  He is about 5 months out from surgery.  Patient states its been hurting more recently.  Patient states that recently he has been in the hospital several times for a possible stroke.  Patient states he had no blood clot but does have some narrowing of the arteries.  Patient states that medication that he was placed on cause difficulty with breathing.  Patient states he does have a follow-up appointment with cardiology.  Assessment & Plan: Visit Diagnoses:  1. History of transmetatarsal amputation of right foot ([place])   2. Right foot ulcer, limited to breakdown of skin [facility])     Plan: Patient will resume his extra-depth shoes with the custom orthotics.  Continue with the compression stockings.  Follow-Up Instructions: Return in about 4 weeks (around 05/05/2021).   Ortho Exam  Patient is alert, oriented, no adenopathy, well-dressed, normal affect, normal respiratory effort. Examination patient has massive hypertrophic callus around a new ulcer over the residual limb.  There is no redness no cellulitis no drainage.  After informed consent a 10 blade knife was used to debride the skin and soft tissue back to healthy viable tissue.  The ulcer after debridement is 3 cm long 1 cm wide and 3 mm deep silver nitrate was used hemostasis there is healthy granulation tissue at the base.  Prior to debridement the ulcer was 5 mm in diameter.  Patient's compression socks were reapplied.  Imaging: No results found. No images are  attached to the encounter.  Labs: Lab Results  Component Value Date   HGBA1C 6.9 (H) 03/13/2021   HGBA1C 6.9 (H) 11/09/2020   HGBA1C 7.0 (H) 01/31/2019   REPTSTATUS 03/18/2021 FINAL 03/16/2021   CULT >=100,000 COLONIES/mL KLEBSIELLA PNEUMONIAE (A) 03/16/2021   LABORGA KLEBSIELLA PNEUMONIAE (A) 03/16/2021     Lab Results  Component Value Date   ALBUMIN 3.6 03/16/2021   ALBUMIN 3.7 03/12/2021   ALBUMIN 2.3 (L) 02/06/2019    Lab Results  Component Value Date   MG 2.1 02/06/2019   MG 2.3 02/05/2019   MG 2.3 02/04/2019   No results found for: VD25OH  No results found for: PREALBUMIN CBC EXTENDED Latest Ref Rng & Units 03/17/2021 03/16/2021 03/16/2021  WBC 4.0 - 10.5 K/uL 4.4 - 6.0  RBC 4.22 - 5.81 MIL/uL 4.65 - 4.76  HGB 13.0 - 17.0 g/dL 13.9 15.0 13.6  HCT 39.0 - 52.0 % 42.2 44.0 43.7  PLT 150 - 400 K/uL PLATELET CLUMPS NOTED ON SMEAR, UNABLE TO ESTIMATE - PLATELET CLUMPS NOTED ON SMEAR, UNABLE TO ESTIMATE  NEUTROABS 1.7 - 7.7 K/uL 1.6(L) - 3.3  LYMPHSABS 0.7 - 4.0 K/uL 1.6 - 1.1     There is no height or weight on file to calculate BMI.  Orders:  No orders of the defined types were placed in this encounter.  No orders of the defined types  were placed in this encounter.    Procedures: No procedures performed  Clinical Data: No additional findings.  ROS:  All other systems negative, except as noted in the HPI. Review of Systems  Objective: Vital Signs: There were no vitals taken for this visit.  Specialty Comments:  No specialty comments available.  PMFS History: Patient Active Problem List   Diagnosis Date Noted   COVID-19 virus infection 03/16/2021   TIA (transient ischemic attack) 03/12/2021   Diabetes mellitus type 2, controlled ([place]) 03/12/2021   Right foot ulcer, limited to breakdown of skin ([place]) 02/28/2020   Osteomyelitis of second toe of right foot ([facility])    Osteomyelitis of great toe of right foot ([facility])    Severe protein-calorie  malnutrition ([place])    Diabetic polyneuropathy associated with type 2 diabetes mellitus ([place])    Cutaneous abscess of right foot    Cellulitis of right foot 01/30/2019   CKD (chronic kidney disease), stage III ([place]) 01/30/2019   Elevated LFTs 01/30/2019   Colon cancer ([place]) 01/30/2019   Anemia of chronic disease 01/30/2019   Acute on chronic combined systolic and diastolic CHF (congestive heart failure) ([place]) 03/09/2018   AKI (acute kidney injury) ([place]) 03/09/2018   Colonic mass 12/10/2017   S/P TAVR (transcatheter aortic valve replacement) 03/03/2017   Severe aortic stenosis 12/15/2016   Essential hypertension 05/09/2013   Hyperlipidemia 05/09/2013   PVC's (premature ventricular contractions) 05/09/2013   Chronic combined systolic and diastolic heart failure ([place]) 05/09/2013   Past Medical History:  Diagnosis Date   Anemia    Anxiety    situational- surgery   Asthma    as a child   Cancer ([place]) 2019   colon- colectomy    CHF (congestive heart failure) ([place])    Chronic kidney disease    followed by Dr. Delfina Redwood   Coronary artery disease    Diabetes mellitus without complication ([place])    Type II   Dyslipidemia 10/27/2015   Dyspnea    w/ exertion    Elevated PSA 10/27/2015   Erectile dysfunction 10/27/2015   Heart murmur    Hypertension 10/27/2015   Hypogonadism male 10/27/2015   Neuropathy    Obesity 10/27/2015   Peripheral vascular disease ([place])    Pneumonia 10/27/2015   pt states was 1982   Rotator cuff tear 10/27/2015    Family History  Problem Relation Age of Onset   Diabetes Mother    Heart disease Mother    Pulmonary embolism Father     Past Surgical History:  Procedure Laterality Date   ABDOMINAL AORTOGRAM N/A 02/01/2019   Procedure: ABDOMINAL AORTOGRAM;  Surgeon: Marty Heck, MD;  Location: [place] [facility];  Service: Cardiovascular;  Laterality: N/A;   AMPUTATION Right 02/03/2019   Procedure: RIGHT FOOT 1ST RAY AMPUTATION;  Surgeon: [name], MD;  Location: [place];  Service: Orthopedics;  Laterality: Right;   AMPUTATION Right 08/02/2019   Procedure: RIGHT 2ND TOE AMPUTATION;  Surgeon: [name], MD;  Location: [place];  Service: Orthopedics;  Laterality: Right;   AMPUTATION Right 11/08/2020   Procedure: RIGHT TRANSMETATARSAL AMPUTATION;  Surgeon: [name], MD;  Location: [place];  Service: Orthopedics;  Laterality: Right;   APPLICATION OF WOUND VAC  11/08/2020   Procedure: APPLICATION OF WOUND VAC;  Surgeon: [name], MD;  Location: [place];  Service: Orthopedics;;   CARDIAC CATHETERIZATION N/A 11/14/2015   Procedure: Left Heart Cath and Coronary Angiography;  Surgeon: [name],  MD;  Location: [place] [facility];  Service: Cardiovascular;  Laterality: N/A;   COLONOSCOPY     LAPAROSCOPIC PARTIAL COLECTOMY  12/15/2017   LAPAROSCOPIC PARTIAL COLECTOMY (N/A Abdomen)   LAPAROSCOPIC PARTIAL COLECTOMY N/A 12/15/2017   Procedure: LAPAROSCOPIC PARTIAL COLECTOMY;  Surgeon: Erroll Luna, MD;  Location: [place];  Service: General;  Laterality: N/A;   LOWER EXTREMITY ANGIOGRAPHY Right 02/01/2019   Procedure: LOWER EXTREMITY ANGIOGRAPHY;  Surgeon: Marty Heck, MD;  Location: [place] [facility];  Service: Cardiovascular;  Laterality: Right;   PERIPHERAL VASCULAR INTERVENTION Right 02/01/2019   Procedure: PERIPHERAL VASCULAR INTERVENTION;  Surgeon: Marty Heck, MD;  Location: [place] [facility];  Service: Cardiovascular;  Laterality: Right;  SFA   TEE WITHOUT CARDIOVERSION N/A 12/15/2016   Procedure: TRANSESOPHAGEAL ECHOCARDIOGRAM (TEE);  Surgeon: Sherren Mocha, MD;  Location: [place];  Service: Open Heart Surgery;  Laterality: N/A;   TRANSCATHETER AORTIC VALVE REPLACEMENT, TRANSFEMORAL N/A 12/15/2016   Procedure: TRANSCATHETER AORTIC VALVE REPLACEMENT, TRANSFEMORAL;  Surgeon: Sherren Mocha, MD;  Location: [place];  Service: Open Heart Surgery;  Laterality: N/A;   Social History   Occupational History   Not on  file  Tobacco Use   Smoking status: Former    Types: Cigarettes   Smokeless tobacco: Never  Vaping Use   Vaping Use: Never used  Substance and Sexual Activity   Alcohol use: Yes    Comment: rarely   Drug use: No   Sexual activity: Not on file

## 2021-05-06 ENCOUNTER — Other Ambulatory Visit: Payer: Self-pay

## 2021-05-06 ENCOUNTER — Ambulatory Visit (INDEPENDENT_AMBULATORY_CARE_PROVIDER_SITE_OTHER): Payer: Medicare HMO | Admitting: Orthopedic Surgery

## 2021-05-06 DIAGNOSIS — Z89431 Acquired absence of right foot: Secondary | ICD-10-CM | POA: Diagnosis not present

## 2021-05-06 DIAGNOSIS — L97511 Non-pressure chronic ulcer of other part of right foot limited to breakdown of skin: Secondary | ICD-10-CM

## 2021-05-07 ENCOUNTER — Encounter: Payer: Self-pay | Admitting: Orthopedic Surgery

## 2021-05-07 NOTE — Progress Notes (Signed)
Office Visit Note   Patient: Robert Horn           Date of Birth: [birth year]           MRN: [id number] Visit Date: 05/06/2021              Requested by: [name], MD [address] [place],  [place] [address] PCP: [name], MD  Chief Complaint  Patient presents with   Right Foot - Follow-up    11/08/20 right transmet amputation       HPI: Patient is an 88 year old gentleman who presents in follow-up for transmetatarsal amputation.  Patient states he has a painful callus ulcer on the end of the residual limb.  He is currently wearing a crew compression sock.  Patient states he was recently in the emergency room for stroke symptoms.  Assessment & Plan: Visit Diagnoses:  1. History of transmetatarsal amputation of right foot ([place])   2. Right foot ulcer, limited to breakdown of skin ([place])     Plan: Continue with protective shoe wear and orthotics.  Follow-Up Instructions: Return in about 4 weeks (around 06/03/2021).   Ortho Exam  Patient is alert, oriented, no adenopathy, well-dressed, normal affect, normal respiratory effort. Examination patient has a plantigrade foot there is no swelling no cellulitis no drainage.  He has an end bearing ulcer over the residual limb.  After informed consent a 10 blade knife was used to debride the skin and soft tissue back to healthy viable granulation tissue this was touched with silver nitrate.  After debridement the ulcerative area was 1 x 2 cm prior to debridement this was 0.5 cm.  Imaging: No results found. No images are attached to the encounter.  Labs: Lab Results  Component Value Date   HGBA1C 6.9 (H) 03/13/2021   HGBA1C 6.9 (H) 11/09/2020   HGBA1C 7.0 (H) 01/31/2019   REPTSTATUS 03/18/2021 FINAL 03/16/2021   CULT >=100,000 COLONIES/mL KLEBSIELLA PNEUMONIAE (A) 03/16/2021   LABORGA KLEBSIELLA PNEUMONIAE (A) 03/16/2021     Lab Results  Component Value Date   ALBUMIN 3.6 03/16/2021   ALBUMIN 3.7  03/12/2021   ALBUMIN 2.3 (L) 02/06/2019    Lab Results  Component Value Date   MG 2.1 02/06/2019   MG 2.3 02/05/2019   MG 2.3 02/04/2019   No results found for: VD25OH  No results found for: PREALBUMIN CBC EXTENDED Latest Ref Rng & Units 03/17/2021 03/16/2021 03/16/2021  WBC 4.0 - 10.5 K/uL 4.4 - 6.0  RBC 4.22 - 5.81 MIL/uL 4.65 - 4.76  HGB 13.0 - 17.0 g/dL 13.9 15.0 13.6  HCT 39.0 - 52.0 % 42.2 44.0 43.7  PLT 150 - 400 K/uL PLATELET CLUMPS NOTED ON SMEAR, UNABLE TO ESTIMATE - PLATELET CLUMPS NOTED ON SMEAR, UNABLE TO ESTIMATE  NEUTROABS 1.7 - 7.7 K/uL 1.6(L) - 3.3  LYMPHSABS 0.7 - 4.0 K/uL 1.6 - 1.1     There is no height or weight on file to calculate BMI.  Orders:  No orders of the defined types were placed in this encounter.  No orders of the defined types were placed in this encounter.    Procedures: No procedures performed  Clinical Data: No additional findings.  ROS:  All other systems negative, except as noted in the HPI. Review of Systems  Objective: Vital Signs: There were no vitals taken for this visit.  Specialty Comments:  No specialty comments available.  PMFS History: Patient Active Problem List   Diagnosis Date Noted  COVID-19 virus infection 03/16/2021   TIA (transient ischemic attack) 03/12/2021   Diabetes mellitus type 2, controlled ([place]) 03/12/2021   Right foot ulcer, limited to breakdown of skin ([place]) 02/28/2020   Osteomyelitis of second toe of right foot (HCC)    Osteomyelitis of great toe of right foot (HCC)    Severe protein-calorie malnutrition ([place])    Diabetic polyneuropathy associated with type 2 diabetes mellitus ([place])    Cutaneous abscess of right foot    Cellulitis of right foot 01/30/2019   CKD (chronic kidney disease), stage III ([place]) 01/30/2019   Elevated LFTs 01/30/2019   Colon cancer ([place]) 01/30/2019   Anemia of chronic disease 01/30/2019   Acute on chronic combined systolic and diastolic CHF (congestive heart  failure) ([place]) 03/09/2018   AKI (acute kidney injury) ([place]) 03/09/2018   Colonic mass 12/10/2017   S/P TAVR (transcatheter aortic valve replacement) 03/03/2017   Severe aortic stenosis 12/15/2016   Essential hypertension 05/09/2013   Hyperlipidemia 05/09/2013   PVC's (premature ventricular contractions) 05/09/2013   Chronic combined systolic and diastolic heart failure ([place]) 05/09/2013   Past Medical History:  Diagnosis Date   Anemia    Anxiety    situational- surgery   Asthma    as a child   Cancer ([place]) 2019   colon- colectomy    CHF (congestive heart failure) ([place])    Chronic kidney disease    followed by Dr. Delfina Redwood   Coronary artery disease    Diabetes mellitus without complication ([place])    Type II   Dyslipidemia 10/27/2015   Dyspnea    w/ exertion    Elevated PSA 10/27/2015   Erectile dysfunction 10/27/2015   Heart murmur    Hypertension 10/27/2015   Hypogonadism male 10/27/2015   Neuropathy    Obesity 10/27/2015   Peripheral vascular disease ([place])    Pneumonia 10/27/2015   pt states was 1982   Rotator cuff tear 10/27/2015    Family History  Problem Relation Age of Onset   Diabetes Mother    Heart disease Mother    Pulmonary embolism Father     Past Surgical History:  Procedure Laterality Date   ABDOMINAL AORTOGRAM N/A 02/01/2019   Procedure: ABDOMINAL AORTOGRAM;  Surgeon: Marty Heck, MD;  Location: [facility];  Service: Cardiovascular;  Laterality: N/A;   AMPUTATION Right 02/03/2019   Procedure: RIGHT FOOT 1ST RAY AMPUTATION;  Surgeon: [name], MD;  Location: [facility];  Service: Orthopedics;  Laterality: Right;   AMPUTATION Right 08/02/2019   Procedure: RIGHT 2ND TOE AMPUTATION;  Surgeon: [name], MD;  Location: [facility];  Service: Orthopedics;  Laterality: Right;   AMPUTATION Right 11/08/2020   Procedure: RIGHT TRANSMETATARSAL AMPUTATION;  Surgeon: [name], MD;  Location: [facility];  Service: Orthopedics;  Laterality: Right;    APPLICATION OF WOUND VAC  11/08/2020   Procedure: APPLICATION OF WOUND VAC;  Surgeon: [name], MD;  Location: [facility];  Service: Orthopedics;;   CARDIAC CATHETERIZATION N/A 11/14/2015   Procedure: Left Heart Cath and Coronary Angiography;  Surgeon: [name], MD;  Location: [facility];  Service: Cardiovascular;  Laterality: N/A;   COLONOSCOPY     LAPAROSCOPIC PARTIAL COLECTOMY  12/15/2017   LAPAROSCOPIC PARTIAL COLECTOMY (N/A Abdomen)   LAPAROSCOPIC PARTIAL COLECTOMY N/A 12/15/2017   Procedure: LAPAROSCOPIC PARTIAL COLECTOMY;  Surgeon: Erroll Luna, MD;  Location: [facility];  Service: General;  Laterality: N/A;   LOWER EXTREMITY ANGIOGRAPHY Right 02/01/2019  Procedure: LOWER EXTREMITY ANGIOGRAPHY;  Surgeon: Marty Heck, MD;  Location: [facility];  Service: Cardiovascular;  Laterality: Right;   PERIPHERAL VASCULAR INTERVENTION Right 02/01/2019   Procedure: PERIPHERAL VASCULAR INTERVENTION;  Surgeon: Marty Heck, MD;  Location: [facility];  Service: Cardiovascular;  Laterality: Right;  SFA   TEE WITHOUT CARDIOVERSION N/A 12/15/2016   Procedure: TRANSESOPHAGEAL ECHOCARDIOGRAM (TEE);  Surgeon: Sherren Mocha, MD;  Location: [facility];  Service: Open Heart Surgery;  Laterality: N/A;   TRANSCATHETER AORTIC VALVE REPLACEMENT, TRANSFEMORAL N/A 12/15/2016   Procedure: TRANSCATHETER AORTIC VALVE REPLACEMENT, TRANSFEMORAL;  Surgeon: Sherren Mocha, MD;  Location: [facility];  Service: Open Heart Surgery;  Laterality: N/A;   Social History   Occupational History   Not on file  Tobacco Use   Smoking status: Former    Types: Cigarettes   Smokeless tobacco: Never  Vaping Use   Vaping Use: Never used  Substance and Sexual Activity   Alcohol use: Yes    Comment: rarely   Drug use: No   Sexual activity: Not on file

## 2021-05-13 ENCOUNTER — Encounter: Payer: Self-pay | Admitting: Adult Health

## 2021-05-13 ENCOUNTER — Ambulatory Visit: Payer: Medicare HMO | Admitting: Adult Health

## 2021-05-13 VITALS — BP 128/71 | HR 59 | Ht 72.0 in | Wt 233.0 lb

## 2021-05-13 DIAGNOSIS — E785 Hyperlipidemia, unspecified: Secondary | ICD-10-CM | POA: Diagnosis not present

## 2021-05-13 DIAGNOSIS — G459 Transient cerebral ischemic attack, unspecified: Secondary | ICD-10-CM

## 2021-05-13 DIAGNOSIS — Z09 Encounter for follow-up examination after completed treatment for conditions other than malignant neoplasm: Secondary | ICD-10-CM

## 2021-05-13 NOTE — Progress Notes (Signed)
[facility] [address] [place]. [place] [address] [phone]       HOSPITAL FOLLOW UP NOTE  [name] Date of Birth:  01-25-1934 Medical Record Number:  [id number]   Reason for Referral:  hospital stroke follow up    SUBJECTIVE:   CHIEF COMPLAINT:  Chief Complaint  Patient presents with   Follow-up    Rm 3 with wife here for hospital follow up. Pt reports he has been doing well since his ER visit. Has long history of neuropathy and feels like sx are getting worse.     HPI:   [name] is a 88 y.o. male with history of DM2, CHF, HTN, CAD, cardiac valve replacement, colon cancer and transmetatarsal amputation of right who presented on 03/12/2021 with transient slurred speech and left sided facial droop.  Personally reviewed hospitalization pertinent progress notes, lab work and imaging.  Evaluated by Dr. Erlinda Hong.  Symptoms resolved spontaneously upon arrival to ED. Per Dr. Erlinda Hong, likely TIA due to right M2 high-grade stenosis vs short segment occlusion as demonstrated on MRA head. CT head and MRI brain no acute infarct.  Carotid ultrasound 1 to 39% stenosis bilaterally.  EF 45 to 50%.  LDL 103.  A1c 6.9.  On aspirin and Plavix PTA and recommended aspirin and Brilinta for 30 days then back to aspirin and Plavix.  HTN stable on Coreg.  Increase home dose Crestor from 20 mg to 40 mg daily.  Controlled DM on Jardiance. Known CHF on Entresto and furosemide followed by cardiology.  Other stroke risk factors include advanced age, former tobacco use, obesity, CAD and PAD.  Therapy eval's without therapy needs and discharged home.  He returned on 03/16/2021 for acute onset chest discomfort and slurred speech.  Slurred speech resolved spontaneously upon arrival to ED. CTH and MRI brain negative for acute infarct.  CTA head/neck focal occlusion of proximal right M2 with distal reconstitution, mild plaque in bilateral ICAs and 15mm left sided pulmonary nodule. Noted to be  positive for COVID.  Evaluated by Dr. Leonie Man for likely TIA in setting of COVID-19 infection.  LDL 89.  A1c 6.9. advised to continue aspirin and Brilinta for 30 days then resume aspirin and Plavix as on PTA. D/c'd home on 12/19 but returned that same day later in the evening with complaints of slurred speech, disequilibrium and shortness of breath but left AMA prior to being seen.     Today, 05/13/2021, patient being seen for initial hospital follow-up accompanied by his wife.  Doing well since discharge without new or reoccurring stroke/TIA symptoms.  Has returned back to all prior activities without difficulty.  Reports difficulty tolerating Brilinta with complaints of shortness of breath and after approx 3 weeks, was changed back to aspirin and Plavix by cardiology with resolution of symptoms.  Continues on Crestor without side effects but apparently was discharged on higher dose as recommended by Dr Erlinda Hong.  Blood pressure today 128/71. Occasionally monitors which has been stable.    He does mention chronic history of very slight BLE numbness and tinlging but has been experiencing worsening right foot pain since he underwent R foot transmetatarsal amputation 10/2020.  Routinely followed by Dr. Sharol Given monthly with painful callus ulcer on the end of residual limb undergoing debridements - he reports he is speaking of this type of pain/discomfort. Reports being told in the hospital we treat neuropathy and is asking about treatment options for his pain. He reports being told by Dr. Delfina Redwood and Dr.  [name] that this pain was likely not from chronic known diabetic neuropathy      PERTINENT IMAGING/LABS  Per hospitalization 03/12/2021 Code Stroke CT head No acute abnormality.  ASPECTS 10.    MRI  No acute intracranial process MRA  focal non opacification of right M2 branch and focal stenosis of left M2 Carotid Doppler  1-39% stenosis in bilateral carotids 2D Echo EF 45 to 50% LDL 103 HgbA1c 6.9  Per  hospitalization 03/16/2021 Code Stroke CT head No acute abnormality. ASPECTS 10.    CTA head & neck focal occlusion of proximal right M2 with distal reconstitution, mild plaque in bilateral ICAs, 39mm sold left sided pulmonary nodule MRI  No acute intracranial process 2D Echo EF 41-96%, grade 1 diastolic dysfunction, mildly dilated left ventricle, no atrial level shunt LDL 89 HgbA1c 6.9      ROS:   14 system review of systems performed and negative with exception of those listed in HPI  PMH:  Past Medical History:  Diagnosis Date   Anemia    Anxiety    situational- surgery   Asthma    as a child   Cancer ([place]) 2019   colon- colectomy    CHF (congestive heart failure) ([place])    Chronic kidney disease    followed by Dr. Delfina Redwood   Coronary artery disease    Diabetes mellitus without complication ([place])    Type II   Dyslipidemia 10/27/2015   Dyspnea    w/ exertion    Elevated PSA 10/27/2015   Erectile dysfunction 10/27/2015   Heart murmur    Hypertension 10/27/2015   Hypogonadism male 10/27/2015   Neuropathy    Obesity 10/27/2015   Peripheral vascular disease ([place])    Pneumonia 10/27/2015   pt states was 1982   Rotator cuff tear 10/27/2015    PSH:  Past Surgical History:  Procedure Laterality Date   ABDOMINAL AORTOGRAM N/A 02/01/2019   Procedure: ABDOMINAL AORTOGRAM;  Surgeon: Marty Heck, MD;  Location: [facility];  Service: Cardiovascular;  Laterality: N/A;   AMPUTATION Right 02/03/2019   Procedure: RIGHT FOOT 1ST [place] AMPUTATION;  Surgeon: [name], MD;  Location: [facility];  Service: Orthopedics;  Laterality: Right;   AMPUTATION Right 08/02/2019   Procedure: RIGHT 2ND TOE AMPUTATION;  Surgeon: [name], MD;  Location: [facility];  Service: Orthopedics;  Laterality: Right;   AMPUTATION Right 11/08/2020   Procedure: RIGHT TRANSMETATARSAL AMPUTATION;  Surgeon: [name], MD;  Location: [facility];  Service: Orthopedics;  Laterality: Right;    APPLICATION OF WOUND VAC  11/08/2020   Procedure: APPLICATION OF WOUND VAC;  Surgeon: [name], MD;  Location: [facility];  Service: Orthopedics;;   CARDIAC CATHETERIZATION N/A 11/14/2015   Procedure: Left Heart Cath and Coronary Angiography;  Surgeon: [name], MD;  Location: [facility];  Service: Cardiovascular;  Laterality: N/A;   COLONOSCOPY     LAPAROSCOPIC PARTIAL COLECTOMY  12/15/2017   LAPAROSCOPIC PARTIAL COLECTOMY (N/A Abdomen)   LAPAROSCOPIC PARTIAL COLECTOMY N/A 12/15/2017   Procedure: LAPAROSCOPIC PARTIAL COLECTOMY;  Surgeon: Erroll Luna, MD;  Location: [facility];  Service: General;  Laterality: N/A;   LOWER EXTREMITY ANGIOGRAPHY Right 02/01/2019   Procedure: LOWER EXTREMITY ANGIOGRAPHY;  Surgeon: Marty Heck, MD;  Location: [facility];  Service: Cardiovascular;  Laterality: Right;   PERIPHERAL VASCULAR INTERVENTION Right 02/01/2019   Procedure: PERIPHERAL VASCULAR INTERVENTION;  Surgeon: Marty Heck, MD;  Location: [facility];  Service: Cardiovascular;  Laterality: Right;  SFA   TEE WITHOUT CARDIOVERSION N/A 12/15/2016   Procedure: TRANSESOPHAGEAL ECHOCARDIOGRAM (TEE);  Surgeon: Sherren Mocha, MD;  Location: [facility];  Service: Open Heart Surgery;  Laterality: N/A;   TRANSCATHETER AORTIC VALVE REPLACEMENT, TRANSFEMORAL N/A 12/15/2016   Procedure: TRANSCATHETER AORTIC VALVE REPLACEMENT, TRANSFEMORAL;  Surgeon: Sherren Mocha, MD;  Location: [facility];  Service: Open Heart Surgery;  Laterality: N/A;    Social History:  Social History   Socioeconomic History   Marital status: Married    Spouse name: Not on file   Number of children: Not on file   Years of education: Not on file   Highest education level: Not on file  Occupational History   Not on file  Tobacco Use   Smoking status: Former    Types: Cigarettes   Smokeless tobacco: Never  Vaping Use   Vaping Use: Never used  Substance and Sexual Activity   Alcohol use: Yes     Comment: rarely   Drug use: No   Sexual activity: Not on file  Other Topics Concern   Not on file  Social History Narrative   Not on file   Social Determinants of Health   Financial Resource Strain: Not on file  Food Insecurity: Not on file  Transportation Needs: Not on file  Physical Activity: Not on file  Stress: Not on file  Social Connections: Not on file  Intimate Partner Violence: Not on file    Family History:  Family History  Problem Relation Age of Onset   Diabetes Mother    Heart disease Mother    Pulmonary embolism Father     Medications:   Current Outpatient Medications on File Prior to Visit  Medication Sig Dispense Refill   amLODipine (NORVASC) 10 MG tablet Take 10 mg by mouth daily.     aspirin 81 MG EC tablet Take 81 mg by mouth daily. Swallow whole.     carvedilol (COREG) 3.125 MG tablet TAKE 1 TABLET (3.125 MG TOTAL) BY MOUTH 2 (TWO) TIMES DAILY. 180 tablet 3   Cholecalciferol (VITAMIN D) 50 MCG (2000 UT) tablet Take 2,000 Units by mouth daily.     clopidogrel (PLAVIX) 75 MG tablet Take 1 tablet (75 mg total) by mouth daily.     ferrous sulfate 325 (65 FE) MG tablet Take 325 mg by mouth daily with breakfast.     finasteride (PROSCAR) 5 MG tablet Take 5 mg by mouth daily.     furosemide (LASIX) 40 MG tablet TAKE 1.5 TABLETS DAILY. 135 tablet 2   JARDIANCE 25 MG TABS tablet Take 25 mg by mouth daily.     LOVAZA 1 g capsule TAKE 1 CAPSULE BY MOUTH EVERY DAY 90 capsule 3   Multiple Vitamin (MULTIVITAMIN WITH MINERALS) TABS tablet Take 1 tablet by mouth daily. 30 tablet 1   rosuvastatin (CRESTOR) 20 MG tablet Take 20 mg by mouth daily.     sacubitril-valsartan (ENTRESTO) 49-51 MG Take 1 tablet by mouth 2 (two) times daily. 60 tablet 11   tamsulosin (FLOMAX) 0.4 MG CAPS capsule Take 0.4 mg by mouth daily.     VITAMIN B COMPLEX-C PO Take 1 tablet by mouth daily.     No current facility-administered medications on file prior to visit.    Allergies:  No Known  Allergies    OBJECTIVE:  Physical Exam  Vitals:   05/13/21 0909  BP: 128/71  Pulse: (!) 59  Weight: 233 lb (105.7 kg)  Height: 6' (1.829 m)   Body mass index is 31.6 kg/m. No results found.  Post stroke PHQ 2/9 Depression screen PHQ 2/9 05/13/2021  Decreased Interest 0  Down, Depressed, Hopeless 0  PHQ - 2 Score 0  Some recent data might be hidden     General: well developed, well nourished, very pleasant elderly African-American male, seated, in no evident distress Head: head normocephalic and atraumatic.   Neck: supple with no carotid or supraclavicular bruits Cardiovascular: regular rate and rhythm, no murmurs Musculoskeletal: s/p right toes amputation with use of protective shoe Skin:  no rash/petichiae Vascular:  Normal pulses all extremities   Neurologic Exam Mental Status: Awake and fully alert. Fluent speech and language. Oriented to place and time. Recent and remote memory intact. Attention span, concentration and fund of knowledge appropriate. Mood and affect appropriate.  Cranial Nerves: Fundoscopic exam reveals sharp disc margins. Pupils equal, briskly reactive to light. Extraocular movements full without nystagmus. Visual fields full to confrontation. Hearing intact. Facial sensation intact. Face, tongue, palate moves normally and symmetrically.  Motor: Normal bulk and tone. Normal strength in all tested extremity muscles Sensory.: intact to touch , pinprick , position and vibratory sensation.  Coordination: Rapid alternating movements normal in all extremities. Finger-to-nose and heel-to-shin performed accurately bilaterally. Gait and Station: Arises from chair without difficulty. Stance is normal. Gait demonstrates normal stride length and mild imbalance (due to right foot).  Tandem walk and heel toe not attempted due to right foot pain.  Reflexes: 1+ and symmetric. Toes downgoing.     NIHSS  0 Modified Rankin  0      ASSESSMENT: Robert Horn is a  88 y.o. year old male with TIA on 03/12/2021 in setting of right M2 high-grade stenosis vs short segment occlusion and TIA on 03/16/2021 in setting of likely COVID hypercoagulability. Vascular risk factors include HTN, HLD, DM, CHF, advanced age, former tobacco use, obesity, CAD and PAD.      PLAN:  TIA x2: Continued approx 3 weeks of asa and Brilinta due to intolerance to Brilinta (cause dyspnea). Has since resumed back to aspirin 81 mg daily and clopidogrel 75 mg daily as on prior to TIAs and continue Crestor 20 mg daily (was not increased at hospital d/c) for secondary stroke prevention and for cardiac hx.  Discussed secondary stroke prevention measures and importance of close PCP follow up for aggressive stroke risk factor management. I have gone over the pathophysiology of stroke, warning signs and symptoms, risk factors and their management in some detail with instructions to go to the closest emergency room for symptoms of concern. HTN: BP goal <130/90.  Stable on amlodipine 10 mg daily per PCP HLD: LDL goal <70. Recent LDL 89.  Continue Crestor 20 mg daily managed by PCP - repeat lipd panel today - if LDL remains above goal, will recommend increasing Crestor dosage as this was initially recommended at hospital discharge. If LDL at or below goal, can continue current dosage and ensure routine follow-up with PCP DMII: A1c goal<7.0. Recent A1c 6.9.  Routinely monitored by PCP Right foot pain: present since toe amputation 10/2020 with resultant painful ulcer. He likely has underlying diabetic neuropathy but I do not believe his current pain is due to his diabetic neuropathy. Discussed ensuring routine follow-up with Dr. Sharol Given to discuss options for this pain but hopefully will continue to improve on its own as this continues to heal. After this is healed and would like to pursue further evaluation for neuropathy, request  PCP place referral to be seen by one of our neurologists but I do not believe this  would be of any benefit at this present time and denies pain in left foot.     Overall stable from stroke standpoint without further recommendations and currently routinely followed by PCP, cardiology, [facility], and orthopedics. Can follow up regarding stroke/TIA as needed   CC:  [facility] provider: Dr. Leonie Man PCP: [name], MD    I spent 67 minutes of face-to-face and non-face-to-face time with patient and wife.  This included previsit chart review including review of recent hospitalization, lab review, study review, electronic health record documentation, patient and wife education regarding recent stroke including etiology, secondary stroke prevention measures and importance of managing stroke risk factors, prolonged discussion regarding right foot pain and underlying neuropathy with multiple questions answered and answered all other questions to patient satisfaction   Frann Rider, AGNP-BC  [facility] [address] [place], [place] [address]  Phone [phone] Fax [phone] Note: This document was prepared with digital dictation and possible smart phrase technology. Any transcriptional errors that result from this process are unintentional.

## 2021-05-13 NOTE — Patient Instructions (Signed)
Continue aspirin 81 mg daily and clopidogrel 75 mg daily  and Crestor for secondary stroke prevention and extensive cardiac history  We will check cholesterol levels today  Continue to follow up with PCP regarding cholesterol, blood pressure and diabetes management  Maintain strict control of hypertension with blood pressure goal below 130/90, diabetes with hemoglobin A1c goal below 7.0 % and cholesterol with LDL cholesterol (bad cholesterol) goal below 70 mg/dL.   Signs of a Stroke? Follow the BEFAST method:  Balance Watch for a sudden loss of balance, trouble with coordination or vertigo Eyes Is there a sudden loss of vision in one or both eyes? Or double vision?  Face: Ask the person to smile. Does one side of the face droop or is it numb?  Arms: Ask the person to raise both arms. Does one arm drift downward? Is there weakness or numbness of a leg? Speech: Ask the person to repeat a simple phrase. Does the speech sound slurred/strange? Is the person confused ? Time: If you observe any of these signs, call 911.        Thank you for coming to see Robert Horn at [facility]. I hope we have been able to provide you high quality care today.  You may receive a patient satisfaction survey over the next few weeks. We would appreciate your feedback and comments so that we may continue to improve ourselves and the health of our patients.     Cholesterol Content in Foods Cholesterol is a waxy, fat-like substance that helps to carry fat in the blood. The body needs cholesterol in small amounts, but too much cholesterol can cause damage to the arteries and heart. What foods have cholesterol? Cholesterol is found in animal-based foods, such as meat, seafood, and dairy. Generally, low-fat dairy and lean meats have less cholesterol than full-fat dairy and fatty meats. The milligrams of cholesterol per serving (mg per serving) of common cholesterol-containing foods are listed below. Meats  and other proteins Egg -- one large whole egg has 186 mg. Veal shank -- 4 oz (113 g) has 141 mg. Lean ground [place] (93% lean) -- 4 oz (113 g) has 118 mg. Fat-trimmed lamb loin -- 4 oz (113 g) has 106 mg. Lean ground beef (90% lean) -- 4 oz (113 g) has 100 mg. Lobster -- 3.5 oz (99 g) has 90 mg. Pork loin chops -- 4 oz (113 g) has 86 mg. Canned salmon -- 3.5 oz (99 g) has 83 mg. Fat-trimmed beef top loin -- 4 oz (113 g) has 78 mg. Frankfurter -- 1 frank (3.5 oz or 99 g) has 77 mg. Crab -- 3.5 oz (99 g) has 71 mg. Roasted chicken without skin, white meat -- 4 oz (113 g) has 66 mg. Light bologna -- 2 oz (57 g) has 45 mg. Deli-cut [place] -- 2 oz (57 g) has 31 mg. Canned tuna -- 3.5 oz (99 g) has 31 mg. Berniece Salines -- 1 oz (28 g) has 29 mg. Oysters and mussels (raw) -- 3.5 oz (99 g) has 25 mg. Mackerel -- 1 oz (28 g) has 22 mg. Trout -- 1 oz (28 g) has 20 mg. Pork sausage -- 1 link (1 oz or 28 g) has 17 mg. Salmon -- 1 oz (28 g) has 16 mg. Tilapia -- 1 oz (28 g) has 14 mg. Dairy Soft-serve ice cream --  cup (4 oz or 86 g) has 103 mg. Whole-milk yogurt -- 1 cup (8 oz or 245 g) has 29  mg. Cheddar cheese -- 1 oz (28 g) has 28 mg. American cheese -- 1 oz (28 g) has 28 mg. Whole milk -- 1 cup (8 oz or 250 mL) has 23 mg. 2% milk -- 1 cup (8 oz or 250 mL) has 18 mg. Cream cheese -- 1 tablespoon (Tbsp) (14.5 g) has 15 mg. Cottage cheese --  cup (4 oz or 113 g) has 14 mg. Low-fat (1%) milk -- 1 cup (8 oz or 250 mL) has 10 mg. Sour cream -- 1 Tbsp (12 g) has 8.5 mg. Low-fat yogurt -- 1 cup (8 oz or 245 g) has 8 mg. Nonfat Greek yogurt -- 1 cup (8 oz or 228 g) has 7 mg. Half-and-half cream -- 1 Tbsp (15 mL) has 5 mg. Fats and oils Cod liver oil -- 1 tablespoon (Tbsp) (13.6 g) has 82 mg. Butter -- 1 Tbsp (14 g) has 15 mg. Lard -- 1 Tbsp (12.8 g) has 14 mg. Bacon grease -- 1 Tbsp (12.9 g) has 14 mg. Mayonnaise -- 1 Tbsp (13.8 g) has 5-10 mg. Margarine -- 1 Tbsp (14 g) has 3-10 mg. The items  listed above may not be a complete list of foods with cholesterol. Exact amounts of cholesterol in these foods may vary depending on specific ingredients and brands. Contact a dietitian for more information. What foods do not have cholesterol? Most plant-based foods do not have cholesterol unless you combine them with a food that has cholesterol. Foods without cholesterol include: Grains and cereals. Vegetables. Fruits. Vegetable oils, such as olive, canola, and sunflower oil. Legumes, such as peas, beans, and lentils. Nuts and seeds. Egg whites. The items listed above may not be a complete list of foods that do not have cholesterol. Contact a dietitian for more information. Summary The body needs cholesterol in small amounts, but too much cholesterol can cause damage to the arteries and heart. Cholesterol is found in animal-based foods, such as meat, seafood, and dairy. Generally, low-fat dairy and lean meats have less cholesterol than full-fat dairy and fatty meats. This information is not intended to replace advice given to you by your health care provider. Make sure you discuss any questions you have with your health care provider. Document Revised: 07/26/2020 Document Reviewed: 07/26/2020 Elsevier Patient Education  [place].

## 2021-05-14 LAB — LIPID PANEL
Chol/HDL Ratio: 3.2 ratio (ref 0.0–5.0)
Cholesterol, Total: 185 mg/dL (ref 100–199)
HDL: 58 mg/dL (ref >39)
LDL Chol Calc (NIH): 107 mg/dL — ABNORMAL HIGH (ref 0–99)
Triglycerides: 113 mg/dL (ref 0–149)
VLDL Cholesterol Cal: 20 mg/dL (ref 5–40)

## 2021-05-14 NOTE — Progress Notes (Signed)
I agree with the above plan 

## 2021-05-15 ENCOUNTER — Encounter: Payer: Self-pay | Admitting: Adult Health

## 2021-05-15 ENCOUNTER — Telehealth: Payer: Self-pay

## 2021-05-15 ENCOUNTER — Other Ambulatory Visit: Payer: Self-pay | Admitting: Adult Health

## 2021-05-15 MED ORDER — ROSUVASTATIN CALCIUM 40 MG PO TABS: 40.0000 mg | ORAL_TABLET | Freq: Every day | ORAL | 5 refills | Status: DC

## 2021-05-15 NOTE — Telephone Encounter (Signed)
-----   Message from Frann Rider, NP sent at 05/15/2021  9:18 AM EST ----- Please call patient to discuss as requested - please advise LDL or bad cholesterol remains above goal at 107. Will recommend increasing Crestor from 20 mg to 40 mg daily - a new rx will be sent to pharmacy and ongoing monitoring and management will be completed by PCP.  Thank you.

## 2021-05-15 NOTE — Telephone Encounter (Signed)
Contacted pt, LVM rq call back regarding labs.

## 2021-05-15 NOTE — Telephone Encounter (Signed)
Called patient, wife answered, on DPR. I reviewed NP's lab result message, new Rx. She verbalized understanding, appreciation.

## 2021-05-15 NOTE — Telephone Encounter (Signed)
Pt returned phone call, would like a call back.  

## 2021-05-20 ENCOUNTER — Inpatient Hospital Stay: Payer: Medicare HMO | Attending: Oncology

## 2021-05-20 ENCOUNTER — Other Ambulatory Visit: Payer: Self-pay

## 2021-05-20 ENCOUNTER — Inpatient Hospital Stay: Payer: Medicare HMO | Admitting: Oncology

## 2021-05-20 ENCOUNTER — Encounter: Payer: Self-pay | Admitting: Oncology

## 2021-05-20 VITALS — BP 130/68 | HR 71 | Temp 97.8°F | Resp 18 | Ht 72.0 in | Wt 232.0 lb

## 2021-05-20 DIAGNOSIS — C184 Malignant neoplasm of transverse colon: Secondary | ICD-10-CM | POA: Diagnosis not present

## 2021-05-20 DIAGNOSIS — I110 Hypertensive heart disease with heart failure: Secondary | ICD-10-CM | POA: Diagnosis not present

## 2021-05-20 DIAGNOSIS — Z952 Presence of prosthetic heart valve: Secondary | ICD-10-CM | POA: Diagnosis not present

## 2021-05-20 DIAGNOSIS — N400 Enlarged prostate without lower urinary tract symptoms: Secondary | ICD-10-CM | POA: Diagnosis not present

## 2021-05-20 DIAGNOSIS — I509 Heart failure, unspecified: Secondary | ICD-10-CM | POA: Insufficient documentation

## 2021-05-20 DIAGNOSIS — Z85038 Personal history of other malignant neoplasm of large intestine: Secondary | ICD-10-CM | POA: Diagnosis not present

## 2021-05-20 LAB — CEA (ACCESS): CEA (CHCC): 1.76 ng/mL (ref 0.00–5.00)

## 2021-05-20 NOTE — Progress Notes (Signed)
[place] OFFICE PROGRESS NOTE   Diagnosis: Colon cancer  INTERVAL HISTORY:   Robert Horn returns as scheduled.  Good appetite.  No difficulty with bowel function.  No bleeding.  He continues to follow-up with Dr. Sharol Given for a slowly healing right foot ulcer.  He has dyspnea with heavy exertion.  No other complaint.  Objective:  Vital signs in last 24 hours:  Blood pressure 130/68, pulse 71, temperature 97.8 F (36.6 C), temperature source Oral, resp. rate 18, height 6' (1.829 m), weight 232 lb (105.2 kg), SpO2 99 %.    Lymphatics: No cervical, supraclavicular, axillary, or inguinal nodes Resp: Lungs clear bilaterally Cardio: Regular rate and rhythm GI: No hepatosplenomegaly, no mass, nontender Vascular: No leg edema   Lab Results:  Lab Results  Component Value Date   WBC 4.4 03/17/2021   HGB 13.9 03/17/2021   HCT 42.2 03/17/2021   MCV 90.8 03/17/2021   PLT PLATELET CLUMPS NOTED ON SMEAR, UNABLE TO ESTIMATE 03/17/2021   NEUTROABS 1.6 (L) 03/17/2021    CMP  Lab Results  Component Value Date   NA 139 03/17/2021   K 3.4 (L) 03/17/2021   CL 105 03/17/2021   CO2 24 03/17/2021   GLUCOSE 152 (H) 03/17/2021   BUN 31 (H) 03/17/2021   CREATININE 1.72 (H) 03/17/2021   CALCIUM 8.7 (L) 03/17/2021   PROT 7.5 03/16/2021   ALBUMIN 3.6 03/16/2021   AST 34 03/16/2021   ALT 20 03/16/2021   ALKPHOS 44 03/16/2021   BILITOT 0.8 03/16/2021   GFRNONAA 38 (L) 03/17/2021   GFRAA 49 (L) 08/02/2019    Lab Results  Component Value Date   CEA1 2.78 09/17/2020   CEA 1.76 05/20/2021    Medications: I have reviewed the patient's current medications.   Assessment/Plan:  Colon cancer, transverse, stage II (T3N0), status post a right and transverse colectomy 12/15/2017 0/25 lymph nodes positive, no lymphovascular or perineural invasion MSI stable, no loss of mismatch repair protein expression CTs 12/10/2017- transverse colon wall thickening, 8 mm transverse mesocolon  lymph node, no evidence of metastatic disease, stable small bilateral pulmonary nodules Colonoscopy 12/09/2017-transverse colon mass, sessile polyps in the descending colon-not removed Colonoscopy 01/13/2019-no polyps, healthy appearing mucosa at the colocolonic anastomosis Polyps on the 12/15/2017 resection specimen- tubular adenomas Enlarged prostate with elevated PSA Status post TAVR 12/15/2016 Hypertension Pneumonia 1982 History of CHF Right first and second toe amputations secondary to osteomyelitis November 2020 in May 2021, right third metatarsal head ulcer/osteomyelitis followed by orthopedics     Disposition: Robert Horn is in remission from colon cancer.  The CEA is normal.  He would like to continue follow-up at the cancer center.  He will return for an office visit and CEA in 8 months.  There were no polyps on the last colonoscopy in October 2020.  He is comfortable foregoing further colonoscopy surveillance.  Betsy Coder, MD  05/20/2021  3:52 PM

## 2021-05-27 ENCOUNTER — Other Ambulatory Visit: Payer: Self-pay

## 2021-05-27 ENCOUNTER — Ambulatory Visit: Payer: Medicare HMO | Admitting: Physician Assistant

## 2021-05-27 ENCOUNTER — Ambulatory Visit (HOSPITAL_COMMUNITY)
Admission: RE | Admit: 2021-05-27 | Discharge: 2021-05-27 | Disposition: A | Discharge status: Home / Self Care | Payer: Medicare HMO | Source: Ambulatory Visit | Attending: Vascular Surgery | Admitting: Vascular Surgery

## 2021-05-27 ENCOUNTER — Ambulatory Visit (INDEPENDENT_AMBULATORY_CARE_PROVIDER_SITE_OTHER)
Admission: RE | Admit: 2021-05-27 | Discharge: 2021-05-27 | Disposition: A | Discharge status: Home / Self Care | Payer: Medicare HMO | Source: Ambulatory Visit | Attending: Physician Assistant | Admitting: Physician Assistant

## 2021-05-27 VITALS — BP 110/66 | HR 54 | Temp 97.3°F | Resp 16 | Ht 71.0 in | Wt 230.4 lb

## 2021-05-27 DIAGNOSIS — I739 Peripheral vascular disease, unspecified: Secondary | ICD-10-CM | POA: Insufficient documentation

## 2021-05-27 NOTE — Progress Notes (Signed)
[facility] [place] HISTORY AND PHYSICAL   History of Present Illness:  Patient is a 88 y.o. year old male who presents for evaluation of PAD.  He is s/p  right SFA angioplasty and stent placement by Dr. Carlis Abbott on 02/01/2019.     On angiogram the  below-knee popliteal artery that was widely patent.  Patient had two-vessel runoff via peroneal and posterior tibial artery.  Anterior tibial occluded shortly after takeoff.  Dominant flow to the foot was via the posterior tibial artery.  Several days later he underwent first toe ray amputation by Dr. Sharol Given.  He has since also had second toe amputation on 08/02/2019 by Dr. Sharol Given.  On 11/08/2020, he underwent right TMA.  He is followed by Dr. Sharol Given and was seen on 05/06/21 with right foot ulcer After debridement the ulcerative area was 1 x 2 cm prior to debridement this was 0.5 cm.  He has a return appt. In 4 weeks.  He was recently seen in the ED for TIA symptoms.  I reviewed his carotid duplex 03/13/21 which demonstrated < 39% stenosis B.    He is medically managed on ASA, Plavix, and Statin.           Past Medical History:  Diagnosis Date   Anemia    Anxiety    situational- surgery   Asthma    as a child   Cancer ([place]) 2019   colon- colectomy    CHF (congestive heart failure) ([place])    Chronic kidney disease    followed by Dr. Delfina Redwood   Coronary artery disease    Diabetes mellitus without complication ([place])    Type II   Dyslipidemia 10/27/2015   Dyspnea    w/ exertion    Elevated PSA 10/27/2015   Erectile dysfunction 10/27/2015   Heart murmur    Hypertension 10/27/2015   Hypogonadism male 10/27/2015   Neuropathy    Obesity 10/27/2015   Peripheral vascular disease ([place])    Pneumonia 10/27/2015   pt states was 1982   Rotator cuff tear 10/27/2015    Past Surgical History:  Procedure Laterality Date   ABDOMINAL AORTOGRAM N/A 02/01/2019   Procedure: ABDOMINAL AORTOGRAM;  Surgeon: Marty Heck, MD;  Location: [place] [facility];  Service: Cardiovascular;  Laterality: N/A;   AMPUTATION Right 02/03/2019   Procedure: RIGHT FOOT 1ST RAY AMPUTATION;  Surgeon: [name], MD;  Location: [place];  Service: Orthopedics;  Laterality: Right;   AMPUTATION Right 08/02/2019   Procedure: RIGHT 2ND TOE AMPUTATION;  Surgeon: [name], MD;  Location: [place];  Service: Orthopedics;  Laterality: Right;   AMPUTATION Right 11/08/2020   Procedure: RIGHT TRANSMETATARSAL AMPUTATION;  Surgeon: [name], MD;  Location: [place];  Service: Orthopedics;  Laterality: Right;   APPLICATION OF WOUND VAC  11/08/2020   Procedure: APPLICATION OF WOUND VAC;  Surgeon: [name], MD;  Location: [place];  Service: Orthopedics;;   CARDIAC CATHETERIZATION N/A 11/14/2015   Procedure: Left Heart Cath and Coronary Angiography;  Surgeon: [name], MD;  Location: [place] [facility];  Service: Cardiovascular;  Laterality: N/A;   COLONOSCOPY     LAPAROSCOPIC PARTIAL COLECTOMY  12/15/2017   LAPAROSCOPIC PARTIAL COLECTOMY (N/A Abdomen)   LAPAROSCOPIC PARTIAL COLECTOMY N/A 12/15/2017   Procedure: LAPAROSCOPIC PARTIAL COLECTOMY;  Surgeon: Erroll Luna, MD;  Location: [facility];  Service: General;  Laterality: N/A;   LOWER EXTREMITY ANGIOGRAPHY Right 02/01/2019   Procedure: LOWER EXTREMITY  ANGIOGRAPHY;  Surgeon: Marty Heck, MD;  Location: [place] [facility];  Service: Cardiovascular;  Laterality: Right;   PERIPHERAL VASCULAR INTERVENTION Right 02/01/2019   Procedure: PERIPHERAL VASCULAR INTERVENTION;  Surgeon: Marty Heck, MD;  Location: [place] [facility];  Service: Cardiovascular;  Laterality: Right;  SFA   TEE WITHOUT CARDIOVERSION N/A 12/15/2016   Procedure: TRANSESOPHAGEAL ECHOCARDIOGRAM (TEE);  Surgeon: Sherren Mocha, MD;  Location: [place];  Service: Open Heart Surgery;  Laterality: N/A;   TRANSCATHETER AORTIC VALVE REPLACEMENT, TRANSFEMORAL N/A 12/15/2016   Procedure: TRANSCATHETER AORTIC VALVE REPLACEMENT, TRANSFEMORAL;   Surgeon: Sherren Mocha, MD;  Location: [place];  Service: Open Heart Surgery;  Laterality: N/A;    ROS:   General:  No weight loss, Fever, chills  HEENT: No recent headaches, no nasal bleeding, no visual changes, no sore throat  Neurologic: No dizziness, blackouts, seizures. No recent symptoms of stroke or mini- stroke. No recent episodes of slurred speech, or temporary blindness.  Cardiac: No recent episodes of chest pain/pressure, no shortness of breath at rest.  No shortness of breath with exertion.  Denies history of atrial fibrillation or irregular heartbeat  Vascular: No history of rest pain in feet.  No history of claudication.  positive history of non-healing ulcer, No history of DVT   Pulmonary: No home oxygen, no productive cough, no hemoptysis,  No asthma or wheezing  Musculoskeletal:  [ ]  Arthritis, [ ]  Low back pain,  [ ]  Joint pain  Hematologic:No history of hypercoagulable state.  No history of easy bleeding.  No history of anemia  Gastrointestinal: No hematochezia or melena,  No gastroesophageal reflux, no trouble swallowing  Urinary: [ ]  chronic Kidney disease, [ ]  on HD - [ ]  MWF or [ ]  TTHS, [ ]  Burning with urination, [ ]  Frequent urination, [ ]  Difficulty urinating;   Skin: No rashes  Psychological: No history of anxiety,  No history of depression  Social History Social History   Tobacco Use   Smoking status: Former    Types: Cigarettes   Smokeless tobacco: Never  Vaping Use   Vaping Use: Never used  Substance Use Topics   Alcohol use: Yes    Comment: rarely   Drug use: No    Family History Family History  Problem Relation Age of Onset   Diabetes Mother    Heart disease Mother    Pulmonary embolism Father     Allergies  No Known Allergies   Current Outpatient Medications  Medication Sig Dispense Refill   amLODipine (NORVASC) 10 MG tablet Take 10 mg by mouth daily.     aspirin 81 MG EC tablet Take 81 mg by mouth daily. Swallow whole.      carvedilol (COREG) 3.125 MG tablet TAKE 1 TABLET (3.125 MG TOTAL) BY MOUTH 2 (TWO) TIMES DAILY. 180 tablet 3   Cholecalciferol (VITAMIN D) 50 MCG (2000 UT) tablet Take 2,000 Units by mouth daily.     clopidogrel (PLAVIX) 75 MG tablet Take 1 tablet (75 mg total) by mouth daily.     ferrous sulfate 325 (65 FE) MG tablet Take 325 mg by mouth daily with breakfast.     finasteride (PROSCAR) 5 MG tablet Take 5 mg by mouth daily.     furosemide (LASIX) 40 MG tablet TAKE 1.5 TABLETS DAILY. 135 tablet 2   JARDIANCE 25 MG TABS tablet Take 25 mg by mouth daily.     LOVAZA 1 g capsule TAKE 1 CAPSULE BY MOUTH EVERY DAY 90 capsule  3   Multiple Vitamin (MULTIVITAMIN WITH MINERALS) TABS tablet Take 1 tablet by mouth daily. 30 tablet 1   rosuvastatin (CRESTOR) 40 MG tablet Take 1 tablet (40 mg total) by mouth daily. 30 tablet 5   sacubitril-valsartan (ENTRESTO) 49-51 MG Take 1 tablet by mouth 2 (two) times daily. 60 tablet 11   tamsulosin (FLOMAX) 0.4 MG CAPS capsule Take 0.4 mg by mouth daily.     VITAMIN B COMPLEX-C PO Take 1 tablet by mouth daily.     No current facility-administered medications for this visit.    Physical Examination  Vitals:   05/27/21 1043 05/27/21 1046  BP: 123/74 110/66  Pulse: (!) 54   Resp: 16   Temp: (!) 97.3 F (36.3 C)   TempSrc: Temporal   SpO2: 97%   Weight: 230 lb 6.4 oz (104.5 kg)   Height: 5\' 11"  (1.803 m)     Body mass index is 32.13 kg/m.  General:  Alert and oriented, no acute distress HEENT: Normal Neck: No bruit or JVD Pulmonary: Clear to auscultation bilaterally Cardiac: Regular Rate and Rhythm with murmur Abdomen: Soft, non-tender, non-distended, no mass, no scars Skin: No rash   Extremity Pulses:  2+ radial, brachial, femoral, non palpable dorsalis pedis, posterior tibial pulses bilaterally Musculoskeletal: right TMA or edema  Neurologic: Upper and lower extremity motor 5/5 and symmetric  DATA:     +--------+--------+-----+--------+--------+--------+   RIGHT    PSV cm/s Ratio Stenosis Waveform Comments   +--------+--------+-----+--------+--------+--------+   SFA Prox 87                                          +--------+--------+-----+--------+--------+--------+        Right Stent(s):  +---------------+--------+--------+--------+--------+   mid SFA         PSV cm/s Stenosis Waveform Comments   +---------------+--------+--------+--------+--------+   Prox to Stent   114               biphasic            +---------------+--------+--------+--------+--------+   Proximal Stent  120               biphasic            +---------------+--------+--------+--------+--------+   Mid Stent       92                biphasic            +---------------+--------+--------+--------+--------+   Distal Stent    100               biphasic            +---------------+--------+--------+--------+--------+   Distal to Stent 108               biphasic            +---------------+--------+--------+--------+--------+    Summary:  Right: Patent mid superficial femoral artery stent without evidence of  stenosis.      ABI Findings:  +--------+------------------+-----+--------+--------+   Right    Rt Pressure (mmHg) Index Waveform Comment    +--------+------------------+-----+--------+--------+   Brachial 155                                          +--------+------------------+-----+--------+--------+   PTA  168                1.08  biphasic            +--------+------------------+-----+--------+--------+   DP       163                1.05  biphasic            +--------+------------------+-----+--------+--------+   +---------+------------------+-----+--------+-------+   Left      Lt Pressure (mmHg) Index Waveform Comment   +---------+------------------+-----+--------+-------+   Brachial  154                                         +---------+------------------+-----+--------+-------+   PTA        110                0.71  biphasic           +---------+------------------+-----+--------+-------+   DP        104                0.67  biphasic           +---------+------------------+-----+--------+-------+   Great Toe 93                 0.60                     +---------+------------------+-----+--------+-------+   +-------+-----------+-----------+------------+------------+   ABI/TBI Today's ABI Today's TBI Previous ABI Previous TBI   +-------+-----------+-----------+------------+------------+   Right   1.08        amputation  1.07                        +-------+-----------+-----------+------------+------------+   Left    0.71        0.60        0.71         0.48           +-------+-----------+-----------+------------+------------+      Bilateral ABIs appear essentially unchanged compared to prior study on  02/24/2021.     Summary:  Right: Resting right ankle-brachial index is within normal range. No  evidence of significant right lower extremity arterial disease.   Left: Resting left ankle-brachial index indicates moderate left lower  extremity arterial disease. The left toe-brachial index is abnormal.   ASSESSMENT:  right SFA angioplasty and stent placement by Dr. Carlis Abbott on 02/01/2019.   Followed by Dr. Sharol Given right TMA.    He has no new complaints with new non healing wounds.  He is followed closely by Dr. Sharol Given for the TMA DM callus wound that appears to be slowly healing.  He denies rest pain or claudication.    His bypass is patent on duplex with biphasic wave forms and the ABI's are stable and unchanged.    PLAN: Continue to be as active as tolerates.  If he develops a new wound, rest pain or claudication he will call our office.  He will f/u in 6 months for repeat studies.     Roxy Horseman PA-C [facility] [place] Office: [phone]  MD in clinic [place]

## 2021-05-30 ENCOUNTER — Other Ambulatory Visit: Payer: Self-pay | Admitting: *Deleted

## 2021-05-30 DIAGNOSIS — I739 Peripheral vascular disease, unspecified: Secondary | ICD-10-CM

## 2021-06-03 ENCOUNTER — Ambulatory Visit (INDEPENDENT_AMBULATORY_CARE_PROVIDER_SITE_OTHER): Payer: Medicare HMO | Admitting: Orthopedic Surgery

## 2021-06-03 DIAGNOSIS — Z89431 Acquired absence of right foot: Secondary | ICD-10-CM | POA: Diagnosis not present

## 2021-06-03 DIAGNOSIS — L97511 Non-pressure chronic ulcer of other part of right foot limited to breakdown of skin: Secondary | ICD-10-CM

## 2021-06-04 ENCOUNTER — Encounter: Payer: Self-pay | Admitting: Orthopedic Surgery

## 2021-06-04 NOTE — Progress Notes (Signed)
? ?Office Visit Note ?  ?Patient: Robert Horn           ?Date of Birth: 1933-08-13           ?MRN: [id number] ?Visit Date: 06/03/2021 ?             ?Requested by: [name], MD ?[address] ?[address] ?[place],  [place] [address] ?PCP: [name], MD ? ?Chief Complaint  ?Patient presents with  ? Right Foot - Follow-up  ?  11/08/20 right transmet amputation   ? ?88-year-old gentleman who presents in follow-up status post right transmetatarsal amputation he has been wearing knee-high compression socks he has a recurrent ulcer over the residual limb. ? ? ?HPI: ?Patient is a ? ?Assessment & Plan: ?Visit Diagnoses:  ?1. History of transmetatarsal amputation of right foot ([place])   ?2. Right foot ulcer, limited to breakdown of skin ([place])   ? ? ?Plan: Ulcer was debrided back to healthy viable tissue.  Patient was given a prescription for [employer] for extra-depth shoes custom orthotics spacer and a carbon plate. ? ?Follow-Up Instructions: Return in about 4 weeks (around 07/01/2021).  ? ?Ortho Exam ? ?Patient is alert, oriented, no adenopathy, well-dressed, normal affect, normal respiratory effort. ?Examination patient does have venous stasis swelling in the right leg but no open ulcers.  Patient has a palpable pulse.  There are no plantar ulcers there is an end bearing ulcer over the residual transmetatarsal amputation.  This is 10 mm in diameter.  After informed consent a 10 blade knife was used to debride the skin and soft tissue back to healthy viable granulation tissue.  After debridement the wound is 2 x 3 cm and 2 mm deep this does not probe to bone or tendon no clinical signs of a deep abscess. ? ?Imaging: ?No results found. ?No images are attached to the encounter. ? ?Labs: ?Lab Results  ?Component Value Date  ? HGBA1C 6.9 (H) 03/13/2021  ? HGBA1C 6.9 (H) 11/09/2020  ? HGBA1C 7.0 (H) 01/31/2019  ? REPTSTATUS 03/18/2021 FINAL 03/16/2021  ? CULT >=100,000 COLONIES/mL KLEBSIELLA PNEUMONIAE (A) 03/16/2021  ? LABORGA  KLEBSIELLA PNEUMONIAE (A) 03/16/2021  ? ? ? ?Lab Results  ?Component Value Date  ? ALBUMIN 3.6 03/16/2021  ? ALBUMIN 3.7 03/12/2021  ? ALBUMIN 2.3 (L) 02/06/2019  ? ? ?Lab Results  ?Component Value Date  ? MG 2.1 02/06/2019  ? MG 2.3 02/05/2019  ? MG 2.3 02/04/2019  ? ?No results found for: VD25OH ? ?No results found for: PREALBUMIN ?CBC EXTENDED Latest Ref Rng & Units 03/17/2021 03/16/2021 03/16/2021  ?WBC 4.0 - 10.5 K/uL 4.4 - 6.0  ?RBC 4.22 - 5.81 MIL/uL 4.65 - 4.76  ?HGB 13.0 - 17.0 g/dL 13.9 15.0 13.6  ?HCT 39.0 - 52.0 % 42.2 44.0 43.7  ?PLT 150 - 400 K/uL PLATELET CLUMPS NOTED ON SMEAR, UNABLE TO ESTIMATE - PLATELET CLUMPS NOTED ON SMEAR, UNABLE TO ESTIMATE  ?NEUTROABS 1.7 - 7.7 K/uL 1.6(L) - 3.3  ?LYMPHSABS 0.7 - 4.0 K/uL 1.6 - 1.1  ? ? ? ?There is no height or weight on file to calculate BMI. ? ?Orders:  ?No orders of the defined types were placed in this encounter. ? ?No orders of the defined types were placed in this encounter. ? ? ? Procedures: ?No procedures performed ? ?Clinical Data: ?No additional findings. ? ?ROS: ? ?All other systems negative, except as noted in the HPI. ?Review of Systems ? ?Objective: ?Vital Signs: There were no vitals taken for  this visit. ? ?Specialty Comments:  ?No specialty comments available. ? ?PMFS History: ?Patient Active Problem List  ? Diagnosis Date Noted  ? COVID-19 virus infection 03/16/2021  ? TIA (transient ischemic attack) 03/12/2021  ? Diabetes mellitus type 2, controlled ([place]) 03/12/2021  ? Right foot ulcer, limited to breakdown of skin ([place]) 02/28/2020  ? Osteomyelitis of second toe of right foot ([place])   ? Osteomyelitis of great toe of right foot ([place])   ? Severe protein-calorie malnutrition ([place])   ? Diabetic polyneuropathy associated with type 2 diabetes mellitus ([place])   ? Cutaneous abscess of right foot   ? Cellulitis of right foot 01/30/2019  ? CKD (chronic kidney disease), stage III ([place]) 01/30/2019  ? Elevated LFTs 01/30/2019  ? Colon cancer ([place])  01/30/2019  ? Anemia of chronic disease 01/30/2019  ? Acute on chronic combined systolic and diastolic CHF (congestive heart failure) ([place]) 03/09/2018  ? AKI (acute kidney injury) ([place]) 03/09/2018  ? Colonic mass 12/10/2017  ? S/P TAVR (transcatheter aortic valve replacement) 03/03/2017  ? Severe aortic stenosis 12/15/2016  ? Essential hypertension 05/09/2013  ? Hyperlipidemia 05/09/2013  ? PVC's (premature ventricular contractions) 05/09/2013  ? Chronic combined systolic and diastolic heart failure ([place]) 05/09/2013  ? ?Past Medical History:  ?Diagnosis Date  ? Anemia   ? Anxiety   ? situational- surgery  ? Asthma   ? as a child  ? Cancer [facility]) 2019  ? colon- colectomy   ? CHF (congestive heart failure) ([place])   ? Chronic kidney disease   ? followed by Dr. Delfina Redwood  ? Coronary artery disease   ? Diabetes mellitus without complication ([place])   ? Type II  ? Dyslipidemia 10/27/2015  ? Dyspnea   ? w/ exertion   ? Elevated PSA 10/27/2015  ? Erectile dysfunction 10/27/2015  ? Heart murmur   ? Hypertension 10/27/2015  ? Hypogonadism male 10/27/2015  ? Neuropathy   ? Obesity 10/27/2015  ? Peripheral vascular disease ([place])   ? Pneumonia 10/27/2015  ? pt states was 1982  ? Rotator cuff tear 10/27/2015  ?  ?Family History  ?Problem Relation Age of Onset  ? Diabetes Mother   ? Heart disease Mother   ? Pulmonary embolism Father   ?  ?Past Surgical History:  ?Procedure Laterality Date  ? ABDOMINAL AORTOGRAM N/A 02/01/2019  ? Procedure: ABDOMINAL AORTOGRAM;  Surgeon: Marty Heck, MD;  Location: [place] [facility];  Service: Cardiovascular;  Laterality: N/A;  ? AMPUTATION Right 02/03/2019  ? Procedure: RIGHT FOOT 1ST RAY AMPUTATION;  Surgeon: [name], MD;  Location: [place];  Service: Orthopedics;  Laterality: Right;  ? AMPUTATION Right 08/02/2019  ? Procedure: RIGHT 2ND TOE AMPUTATION;  Surgeon: [name], MD;  Location: [facility];  Service: Orthopedics;  Laterality: Right;  ? AMPUTATION Right 11/08/2020  ? Procedure: RIGHT  TRANSMETATARSAL AMPUTATION;  Surgeon: [name], MD;  Location: [facility];  Service: Orthopedics;  Laterality: Right;  ? APPLICATION OF WOUND VAC  11/08/2020  ? Procedure: APPLICATION OF WOUND VAC;  Surgeon: [name], MD;  Location: [facility] [place];  Service: Orthopedics;;  ? CARDIAC CATHETERIZATION N/A 11/14/2015  ? Procedure: Left Heart Cath and Coronary Angiography;  Surgeon: [name], MD;  Location: [facility] [facility] [facility];  Service: Cardiovascular;  Laterality: N/A;  ? COLONOSCOPY    ? LAPAROSCOPIC PARTIAL COLECTOMY  12/15/2017  ? LAPAROSCOPIC PARTIAL COLECTOMY (N/A Abdomen)  ? LAPAROSCOPIC PARTIAL COLECTOMY N/A 12/15/2017  ? Procedure: LAPAROSCOPIC PARTIAL  COLECTOMY;  Surgeon: Erroll Luna, MD;  Location: [facility];  Service: General;  Laterality: N/A;  ? LOWER EXTREMITY ANGIOGRAPHY Right 02/01/2019  ? Procedure: LOWER EXTREMITY ANGIOGRAPHY;  Surgeon: Marty Heck, MD;  Location: [facility];  Service: Cardiovascular;  Laterality: Right;  ? PERIPHERAL VASCULAR INTERVENTION Right 02/01/2019  ? Procedure: PERIPHERAL VASCULAR INTERVENTION;  Surgeon: Marty Heck, MD;  Location: [facility] [facility];  Service: Cardiovascular;  Laterality: Right;  SFA  ? TEE WITHOUT CARDIOVERSION N/A 12/15/2016  ? Procedure: TRANSESOPHAGEAL ECHOCARDIOGRAM (TEE);  Surgeon: Sherren Mocha, MD;  Location: [facility];  Service: Open Heart Surgery;  Laterality: N/A;  ? TRANSCATHETER AORTIC VALVE REPLACEMENT, TRANSFEMORAL N/A 12/15/2016  ? Procedure: TRANSCATHETER AORTIC VALVE REPLACEMENT, TRANSFEMORAL;  Surgeon: Sherren Mocha, MD;  Location: [place];  Service: Open Heart Surgery;  Laterality: N/A;  ? ?Social History  ? ?Occupational History  ? Not on file  ?Tobacco Use  ? Smoking status: Former  ?  Types: Cigarettes  ? Smokeless tobacco: Never  ?Vaping Use  ? Vaping Use: Never used  ?Substance and Sexual Activity  ? Alcohol use: Yes  ?  Comment: rarely  ? Drug use: No  ? Sexual activity: Not on file  ? ? ? ? ? ?

## 2021-07-03 ENCOUNTER — Ambulatory Visit: Payer: Medicare HMO | Admitting: Orthopedic Surgery

## 2021-07-14 ENCOUNTER — Ambulatory Visit (INDEPENDENT_AMBULATORY_CARE_PROVIDER_SITE_OTHER): Payer: Medicare HMO | Admitting: Orthopedic Surgery

## 2021-07-14 DIAGNOSIS — L97511 Non-pressure chronic ulcer of other part of right foot limited to breakdown of skin: Secondary | ICD-10-CM

## 2021-07-14 DIAGNOSIS — Z89431 Acquired absence of right foot: Secondary | ICD-10-CM | POA: Diagnosis not present

## 2021-07-24 NOTE — Progress Notes (Signed)
?Cardiology Office Note:   ? ?Date:  07/25/2021  ? ?ID:  Robert Horn, DOB February 11, 1934, MRN [id number] ? ?PCP:  [name], MD  ?Cardiologist:  Sinclair Grooms, MD  ? ?Referring MD: [name], MD  ? ?Chief Complaint  ?Patient presents with  ? Coronary Artery Disease  ? Cardiac Valve Problem  ?  TAVR valve  ? ? ?History of Present Illness:   ? ?Robert Horn is a 88 y.o. male with a hx of chronic combined S/D CHF, HTN, HLD, colon cancer, s/p TAVR (12/15/16), chronic combined systolic and diastolic HF (EF to 07-37% with mean AV gradient of 12 mmmHg),  S/P multiple amputations right foot, and recent Covid 19 (02/2021). ? ? ?[name] is doing well.  He has had amputation of his right foot and toes.  He still has his heel and midfoot.  Has been slow healing.  He is taken care of by Dr. Sharol Given. ? ?He is status post TAVR.  He has no shortness of breath, orthopnea, PND, chest pain, palpitations, peripheral edema, and has not had syncope. ? ?Past Medical History:  ?Diagnosis Date  ? Anemia   ? Anxiety   ? situational- surgery  ? Asthma   ? as a child  ? Cancer [facility]) 2019  ? colon- colectomy   ? CHF (congestive heart failure) ([place])   ? Chronic kidney disease   ? followed by Dr. Delfina Redwood  ? Coronary artery disease   ? Diabetes mellitus without complication ([place])   ? Type II  ? Dyslipidemia 10/27/2015  ? Dyspnea   ? w/ exertion   ? Elevated PSA 10/27/2015  ? Erectile dysfunction 10/27/2015  ? Heart murmur   ? Hypertension 10/27/2015  ? Hypogonadism male 10/27/2015  ? Neuropathy   ? Obesity 10/27/2015  ? Peripheral vascular disease ([place])   ? Pneumonia 10/27/2015  ? pt states was 1982  ? Rotator cuff tear 10/27/2015  ? ? ?Past Surgical History:  ?Procedure Laterality Date  ? ABDOMINAL AORTOGRAM N/A 02/01/2019  ? Procedure: ABDOMINAL AORTOGRAM;  Surgeon: Marty Heck, MD;  Location: [place] [facility];  Service: Cardiovascular;  Laterality: N/A;  ? AMPUTATION Right 02/03/2019  ? Procedure: RIGHT FOOT 1ST RAY  AMPUTATION;  Surgeon: [name], MD;  Location: [place];  Service: Orthopedics;  Laterality: Right;  ? AMPUTATION Right 08/02/2019  ? Procedure: RIGHT 2ND TOE AMPUTATION;  Surgeon: [name], MD;  Location: [place];  Service: Orthopedics;  Laterality: Right;  ? AMPUTATION Right 11/08/2020  ? Procedure: RIGHT TRANSMETATARSAL AMPUTATION;  Surgeon: [name], MD;  Location: [place];  Service: Orthopedics;  Laterality: Right;  ? APPLICATION OF WOUND VAC  11/08/2020  ? Procedure: APPLICATION OF WOUND VAC;  Surgeon: [name], MD;  Location: [place];  Service: Orthopedics;;  ? CARDIAC CATHETERIZATION N/A 11/14/2015  ? Procedure: Left Heart Cath and Coronary Angiography;  Surgeon: [name], MD;  Location: [place] [facility];  Service: Cardiovascular;  Laterality: N/A;  ? COLONOSCOPY    ? LAPAROSCOPIC PARTIAL COLECTOMY  12/15/2017  ? LAPAROSCOPIC PARTIAL COLECTOMY (N/A Abdomen)  ? LAPAROSCOPIC PARTIAL COLECTOMY N/A 12/15/2017  ? Procedure: LAPAROSCOPIC PARTIAL COLECTOMY;  Surgeon: Erroll Luna, MD;  Location: [place];  Service: General;  Laterality: N/A;  ? LOWER EXTREMITY ANGIOGRAPHY Right 02/01/2019  ? Procedure: LOWER EXTREMITY ANGIOGRAPHY;  Surgeon: Marty Heck, MD;  Location: [facility];  Service: Cardiovascular;  Laterality: Right;  ? PERIPHERAL VASCULAR INTERVENTION  Right 02/01/2019  ? Procedure: PERIPHERAL VASCULAR INTERVENTION;  Surgeon: Marty Heck, MD;  Location: [facility] [facility];  Service: Cardiovascular;  Laterality: Right;  SFA  ? TEE WITHOUT CARDIOVERSION N/A 12/15/2016  ? Procedure: TRANSESOPHAGEAL ECHOCARDIOGRAM (TEE);  Surgeon: Sherren Mocha, MD;  Location: [facility];  Service: Open Heart Surgery;  Laterality: N/A;  ? TRANSCATHETER AORTIC VALVE REPLACEMENT, TRANSFEMORAL N/A 12/15/2016  ? Procedure: TRANSCATHETER AORTIC VALVE REPLACEMENT, TRANSFEMORAL;  Surgeon: Sherren Mocha, MD;  Location: [facility];  Service: Open Heart Surgery;  Laterality: N/A;  ? ? ?Current  Medications: ?Current Meds  ?Medication Sig  ? amLODipine (NORVASC) 10 MG tablet Take 10 mg by mouth daily.  ? aspirin 81 MG EC tablet Take 81 mg by mouth daily. Swallow whole.  ? carvedilol (COREG) 3.125 MG tablet TAKE 1 TABLET (3.125 MG TOTAL) BY MOUTH 2 (TWO) TIMES DAILY.  ? Cholecalciferol (VITAMIN D) 50 MCG (2000 UT) tablet Take 2,000 Units by mouth daily.  ? clopidogrel (PLAVIX) 75 MG tablet Take 1 tablet (75 mg total) by mouth daily.  ? ferrous sulfate 325 (65 FE) MG tablet Take 325 mg by mouth daily with breakfast.  ? furosemide (LASIX) 40 MG tablet TAKE 1.5 TABLETS DAILY.  ? JARDIANCE 25 MG TABS tablet Take 25 mg by mouth daily.  ? LOVAZA 1 g capsule TAKE 1 CAPSULE BY MOUTH EVERY DAY  ? Multiple Vitamin (MULTIVITAMIN WITH MINERALS) TABS tablet Take 1 tablet by mouth daily.  ? rosuvastatin (CRESTOR) 40 MG tablet Take 1 tablet (40 mg total) by mouth daily.  ? sacubitril-valsartan (ENTRESTO) 49-51 MG Take 1 tablet by mouth 2 (two) times daily.  ? VITAMIN B COMPLEX-C PO Take 1 tablet by mouth daily.  ?  ? ?Allergies:   Patient has no known allergies.  ? ?Social History  ? ?Socioeconomic History  ? Marital status: Married  ?  Spouse name: Not on file  ? Number of children: Not on file  ? Years of education: Not on file  ? Highest education level: Not on file  ?Occupational History  ? Not on file  ?Tobacco Use  ? Smoking status: Former  ?  Types: Cigarettes  ? Smokeless tobacco: Never  ?Vaping Use  ? Vaping Use: Never used  ?Substance and Sexual Activity  ? Alcohol use: Yes  ?  Comment: rarely  ? Drug use: No  ? Sexual activity: Not on file  ?Other Topics Concern  ? Not on file  ?Social History Narrative  ? Not on file  ? ?Social Determinants of Health  ? ?Financial Resource Strain: Not on file  ?Food Insecurity: Not on file  ?Transportation Needs: Not on file  ?Physical Activity: Not on file  ?Stress: Not on file  ?Social Connections: Not on file  ?  ? ?Family History: ?The patient's family history includes  Diabetes in his mother; Heart disease in his mother; Pulmonary embolism in his father. ? ?ROS:   ?Please see the history of present illness.    ?Denies chills or fever.  In good spirits.  Still working some in his [employer].  All other systems reviewed and are negative. ? ?EKGs/Labs/Other Studies Reviewed:   ? ?The following studies were reviewed today: ?No recent imaging. ?2D Doppler echocardiogram March 13, 2021: ?IMPRESSIONS  ? ? ? 1. Left ventricular ejection fraction, by estimation, is 45 to 50%. The  ?left ventricle has mildly decreased function. The left ventricle  ?demonstrates global hypokinesis. The left ventricular internal cavity size  ?was mildly  dilated. Left ventricular  ?diastolic parameters are consistent with Grade I diastolic dysfunction  ?(impaired relaxation).  ? 2. Right ventricular systolic function is normal. The right ventricular  ?size is normal. There is normal pulmonary artery systolic pressure.  ? 3. Left atrial size was severely dilated.  ? 4. The mitral valve is normal in structure. Trivial mitral valve  ?regurgitation. No evidence of mitral stenosis.  ? 5. Aortic valve gradients are moderately elevated, unchanged from  ?01/2019. Deceleration time 81 msec, consistent with a normally funcitoning  ?prosthetic valve. The aortic valve has been repaired/replaced. Aortic  ?valve regurgitation is not visualized. No  ?aortic stenosis is present. Aortic valve area, by VTI measures 0.52 cm?Marland Kitchen  ?Aortic valve mean gradient measures 24.5 mmHg. Aortic valve Vmax measures  ?3.26 m/s.  ? 6. The inferior vena cava is normal in size with greater than 50%  ?respiratory variability, suggesting right atrial pressure of 3 mmHg.  ? ? ?EKG:  EKG performed ? ? ?Recent Labs: ?03/16/2021: ALT 20 ?03/17/2021: B Natriuretic Peptide 102.2; BUN 31; Creatinine, Ser 1.72; Hemoglobin 13.9; Platelets PLATELET CLUMPS NOTED ON SMEAR, UNABLE TO ESTIMATE; Potassium 3.4; Sodium 139  ?Recent Lipid Panel ?   ?Component  Value Date/Time  ? CHOL 185 05/13/2021 0959  ? TRIG 113 05/13/2021 0959  ? HDL 58 05/13/2021 0959  ? CHOLHDL 3.2 05/13/2021 0959  ? CHOLHDL 2.9 03/17/2021 0354  ? VLDL 15 03/17/2021 0354  ? [place] 107 (H) 05/13/2021 0959  ?

## 2021-07-25 ENCOUNTER — Ambulatory Visit: Payer: Medicare HMO | Admitting: Interventional Cardiology

## 2021-07-25 ENCOUNTER — Encounter: Payer: Self-pay | Admitting: Interventional Cardiology

## 2021-07-25 VITALS — BP 128/68 | HR 58 | Ht 71.0 in | Wt 233.2 lb

## 2021-07-25 DIAGNOSIS — I10 Essential (primary) hypertension: Secondary | ICD-10-CM | POA: Diagnosis not present

## 2021-07-25 DIAGNOSIS — N182 Chronic kidney disease, stage 2 (mild): Secondary | ICD-10-CM | POA: Diagnosis not present

## 2021-07-25 DIAGNOSIS — E782 Mixed hyperlipidemia: Secondary | ICD-10-CM

## 2021-07-25 DIAGNOSIS — Z952 Presence of prosthetic heart valve: Secondary | ICD-10-CM | POA: Diagnosis not present

## 2021-07-25 DIAGNOSIS — I739 Peripheral vascular disease, unspecified: Secondary | ICD-10-CM

## 2021-07-25 DIAGNOSIS — I5042 Chronic combined systolic (congestive) and diastolic (congestive) heart failure: Secondary | ICD-10-CM

## 2021-07-25 NOTE — Patient Instructions (Signed)
Medication Instructions:  ?Your physician recommends that you continue on your current medications as directed. Please refer to the Current Medication list given to you today. ? ?*If you need a refill on your cardiac medications before your next appointment, please call your pharmacy* ? ?Lab Work: ?TODAY: BMET ?If you have labs (blood work) drawn today and your tests are completely normal, you will receive your results only by: ?MyChart Message (if you have MyChart) OR ?A paper copy in the mail ?If you have any lab test that is abnormal or we need to change your treatment, we will call you to review the results. ? ?Testing/Procedures: ?NONE ? ?Follow-Up: ?At [facility], you and your health needs are our priority.  As part of our continuing mission to provide you with exceptional heart care, we have created designated Provider Care Teams.  These Care Teams include your primary Cardiologist (physician) and Advanced Practice Providers (APPs -  Physician Assistants and Nurse Practitioners) who all work together to provide you with the care you need, when you need it. ? ?Your next appointment:   ?6-9 month(s) ? ?The format for your next appointment:   ?In Person ? ?Provider:   ?Sinclair Grooms, MD { ? ? ?Important Information About Sugar ? ? ? ? ?  ?

## 2021-07-26 LAB — BASIC METABOLIC PANEL
BUN/Creatinine Ratio: 11 (ref 10–24)
BUN: 19 mg/dL (ref 8–27)
CO2: 24 mmol/L (ref 20–29)
Calcium: 9.6 mg/dL (ref 8.6–10.2)
Chloride: 105 mmol/L (ref 96–106)
Creatinine, Ser: 1.7 mg/dL — ABNORMAL HIGH (ref 0.76–1.27)
Glucose: 108 mg/dL — ABNORMAL HIGH (ref 70–99)
Potassium: 4.7 mmol/L (ref 3.5–5.2)
Sodium: 142 mmol/L (ref 134–144)
eGFR: 39 mL/min/{1.73_m2} — ABNORMAL LOW (ref >59)

## 2021-07-27 ENCOUNTER — Encounter: Payer: Self-pay | Admitting: Orthopedic Surgery

## 2021-07-27 NOTE — Progress Notes (Signed)
? ?Office Visit Note ?  ?Patient: Robert Horn           ?Date of Birth: 1933/06/11           ?MRN: [id number] ?Visit Date: 07/14/2021 ?             ?Requested by: [name], MD ?[address] ?[address] ?[place],  [place] [address] ?PCP: [name], MD ? ?Chief Complaint  ?Patient presents with  ? Right Foot - Wound Check  ?  11/08/20 right transmet amputation ulcer check  ? ? ? ? ?HPI: ?Patient is an 88 year old gentleman who is 8 months status post right transmetatarsal amputation with persistent ulceration.  Patient has extra-depth shoes custom orthotics spacer and a carbon plate.  He states he recently fell but had no injuries. ? ?Assessment & Plan: ?Visit Diagnoses:  ?1. History of transmetatarsal amputation of right foot ([place])   ?2. Right foot ulcer, limited to breakdown of skin ([place])   ? ? ?Plan: Patient will work on Achilles stretching continue with his protective shoe wear. ? ?Follow-Up Instructions: Return in about 4 weeks (around 08/11/2021).  ? ?Ortho Exam ? ?Patient is alert, oriented, no adenopathy, well-dressed, normal affect, normal respiratory effort. ?Examination there is no redness no cellulitis no odor no drainage.  Patient does have a recurrent ulcer with granulation tissue and hypertrophic callus.  The ulcer is 2 cm in diameter.  After informed consent a 10 blade knife was used to debride the skin and soft tissue back to healthy viable granulation tissue.  The ulcer was 3 cm in diameter after debridement 1 mm deep.  Patient does have Achilles tightness with dorsiflexion to neutral. ? ?Imaging: ?No results found. ?No images are attached to the encounter. ? ?Labs: ?Lab Results  ?Component Value Date  ? HGBA1C 6.9 (H) 03/13/2021  ? HGBA1C 6.9 (H) 11/09/2020  ? HGBA1C 7.0 (H) 01/31/2019  ? REPTSTATUS 03/18/2021 FINAL 03/16/2021  ? CULT >=100,000 COLONIES/mL KLEBSIELLA PNEUMONIAE (A) 03/16/2021  ? LABORGA KLEBSIELLA PNEUMONIAE (A) 03/16/2021  ? ? ? ?Lab Results  ?Component Value Date  ?  ALBUMIN 3.6 03/16/2021  ? ALBUMIN 3.7 03/12/2021  ? ALBUMIN 2.3 (L) 02/06/2019  ? ? ?Lab Results  ?Component Value Date  ? MG 2.1 02/06/2019  ? MG 2.3 02/05/2019  ? MG 2.3 02/04/2019  ? ?No results found for: VD25OH ? ?No results found for: PREALBUMIN ? ?  Latest Ref Rng & Units 03/17/2021  ?  6:36 PM 03/16/2021  ?  4:27 PM 03/16/2021  ?  4:23 PM  ?CBC EXTENDED  ?WBC 4.0 - 10.5 K/uL 4.4    6.0    ?RBC 4.22 - 5.81 MIL/uL 4.65    4.76    ?Hemoglobin 13.0 - 17.0 g/dL 13.9   15.0   13.6    ?HCT 39.0 - 52.0 % 42.2   44.0   43.7    ?Platelets 150 - 400 K/uL PLATELET CLUMPS NOTED ON SMEAR, UNABLE TO ESTIMATE    PLATELET CLUMPS NOTED ON SMEAR, UNABLE TO ESTIMATE    ?NEUT# 1.7 - 7.7 K/uL 1.6    3.3    ?Lymph# 0.7 - 4.0 K/uL 1.6    1.1    ? ? ? ?There is no height or weight on file to calculate BMI. ? ?Orders:  ?No orders of the defined types were placed in this encounter. ? ?No orders of the defined types were placed in this encounter. ? ? ? Procedures: ?No procedures performed ? ?Clinical  Data: ?No additional findings. ? ?ROS: ? ?All other systems negative, except as noted in the HPI. ?Review of Systems ? ?Objective: ?Vital Signs: There were no vitals taken for this visit. ? ?Specialty Comments:  ?No specialty comments available. ? ?PMFS History: ?Patient Active Problem List  ? Diagnosis Date Noted  ? COVID-19 virus infection 03/16/2021  ? TIA (transient ischemic attack) 03/12/2021  ? Diabetes mellitus type 2, controlled ([place]) 03/12/2021  ? Right foot ulcer, limited to breakdown of skin ([place]) 02/28/2020  ? Osteomyelitis of second toe of right foot ([place])   ? Osteomyelitis of great toe of right foot ([place])   ? Severe protein-calorie malnutrition ([place])   ? Diabetic polyneuropathy associated with type 2 diabetes mellitus ([place])   ? Cutaneous abscess of right foot   ? Cellulitis of right foot 01/30/2019  ? CKD (chronic kidney disease), stage III ([place]) 01/30/2019  ? Elevated LFTs 01/30/2019  ? Colon cancer ([place]) 01/30/2019  ?  Anemia of chronic disease 01/30/2019  ? Acute on chronic combined systolic and diastolic CHF (congestive heart failure) ([place]) 03/09/2018  ? AKI (acute kidney injury) ([place]) 03/09/2018  ? Colonic mass 12/10/2017  ? S/P TAVR (transcatheter aortic valve replacement) 03/03/2017  ? Severe aortic stenosis 12/15/2016  ? Essential hypertension 05/09/2013  ? Hyperlipidemia 05/09/2013  ? PVC's (premature ventricular contractions) 05/09/2013  ? Chronic combined systolic and diastolic heart failure ([place]) 05/09/2013  ? ?Past Medical History:  ?Diagnosis Date  ? Anemia   ? Anxiety   ? situational- surgery  ? Asthma   ? as a child  ? Cancer [facility]) 2019  ? colon- colectomy   ? CHF (congestive heart failure) ([place])   ? Chronic kidney disease   ? followed by Dr. Delfina Redwood  ? Coronary artery disease   ? Diabetes mellitus without complication ([place])   ? Type II  ? Dyslipidemia 10/27/2015  ? Dyspnea   ? w/ exertion   ? Elevated PSA 10/27/2015  ? Erectile dysfunction 10/27/2015  ? Heart murmur   ? Hypertension 10/27/2015  ? Hypogonadism male 10/27/2015  ? Neuropathy   ? Obesity 10/27/2015  ? Peripheral vascular disease ([place])   ? Pneumonia 10/27/2015  ? pt states was 1982  ? Rotator cuff tear 10/27/2015  ?  ?Family History  ?Problem Relation Age of Onset  ? Diabetes Mother   ? Heart disease Mother   ? Pulmonary embolism Father   ?  ?Past Surgical History:  ?Procedure Laterality Date  ? ABDOMINAL AORTOGRAM N/A 02/01/2019  ? Procedure: ABDOMINAL AORTOGRAM;  Surgeon: Marty Heck, MD;  Location: [facility];  Service: Cardiovascular;  Laterality: N/A;  ? AMPUTATION Right 02/03/2019  ? Procedure: RIGHT FOOT 1ST RAY AMPUTATION;  Surgeon: [name], MD;  Location: [facility];  Service: Orthopedics;  Laterality: Right;  ? AMPUTATION Right 08/02/2019  ? Procedure: RIGHT 2ND TOE AMPUTATION;  Surgeon: [name], MD;  Location: [place];  Service: Orthopedics;  Laterality: Right;  ? AMPUTATION Right 11/08/2020  ? Procedure: RIGHT  TRANSMETATARSAL AMPUTATION;  Surgeon: [name], MD;  Location: [facility];  Service: Orthopedics;  Laterality: Right;  ? APPLICATION OF WOUND VAC  11/08/2020  ? Procedure: APPLICATION OF WOUND VAC;  Surgeon: [name], MD;  Location: [place];  Service: Orthopedics;;  ? CARDIAC CATHETERIZATION N/A 11/14/2015  ? Procedure: Left Heart Cath and Coronary Angiography;  Surgeon: [name], MD;  Location: [facility];  Service: Cardiovascular;  Laterality: N/A;  ?  COLONOSCOPY    ? LAPAROSCOPIC PARTIAL COLECTOMY  12/15/2017  ? LAPAROSCOPIC PARTIAL COLECTOMY (N/A Abdomen)  ? LAPAROSCOPIC PARTIAL COLECTOMY N/A 12/15/2017  ? Procedure: LAPAROSCOPIC PARTIAL COLECTOMY;  Surgeon: Erroll Luna, MD;  Location: [facility];  Service: General;  Laterality: N/A;  ? LOWER EXTREMITY ANGIOGRAPHY Right 02/01/2019  ? Procedure: LOWER EXTREMITY ANGIOGRAPHY;  Surgeon: Marty Heck, MD;  Location: [facility];  Service: Cardiovascular;  Laterality: Right;  ? PERIPHERAL VASCULAR INTERVENTION Right 02/01/2019  ? Procedure: PERIPHERAL VASCULAR INTERVENTION;  Surgeon: Marty Heck, MD;  Location: [facility];  Service: Cardiovascular;  Laterality: Right;  SFA  ? TEE WITHOUT CARDIOVERSION N/A 12/15/2016  ? Procedure: TRANSESOPHAGEAL ECHOCARDIOGRAM (TEE);  Surgeon: Sherren Mocha, MD;  Location: [facility];  Service: Open Heart Surgery;  Laterality: N/A;  ? TRANSCATHETER AORTIC VALVE REPLACEMENT, TRANSFEMORAL N/A 12/15/2016  ? Procedure: TRANSCATHETER AORTIC VALVE REPLACEMENT, TRANSFEMORAL;  Surgeon: Sherren Mocha, MD;  Location: [place];  Service: Open Heart Surgery;  Laterality: N/A;  ? ?Social History  ? ?Occupational History  ? Not on file  ?Tobacco Use  ? Smoking status: Former  ?  Types: Cigarettes  ? Smokeless tobacco: Never  ?Vaping Use  ? Vaping Use: Never used  ?Substance and Sexual Activity  ? Alcohol use: Yes  ?  Comment: rarely  ? Drug use: No  ? Sexual activity: Not on file  ? ? ? ? ? ?

## 2021-07-29 ENCOUNTER — Encounter: Payer: Self-pay | Admitting: Interventional Cardiology

## 2021-07-29 DIAGNOSIS — I10 Essential (primary) hypertension: Secondary | ICD-10-CM

## 2021-07-29 DIAGNOSIS — N182 Chronic kidney disease, stage 2 (mild): Secondary | ICD-10-CM

## 2021-07-29 NOTE — Telephone Encounter (Signed)
Called pt spouse Torrance Stockley ok per DPR reviewed lab results and answered all questions.  Scheduled f/u lab appointment for 12/02/21.  No questions or concerns.  ?

## 2021-08-03 ENCOUNTER — Other Ambulatory Visit: Payer: Self-pay | Admitting: Interventional Cardiology

## 2021-08-14 ENCOUNTER — Ambulatory Visit: Payer: Medicare HMO | Admitting: Orthopedic Surgery

## 2021-08-14 ENCOUNTER — Encounter: Payer: Self-pay | Admitting: Orthopedic Surgery

## 2021-08-14 DIAGNOSIS — L97511 Non-pressure chronic ulcer of other part of right foot limited to breakdown of skin: Secondary | ICD-10-CM | POA: Diagnosis not present

## 2021-08-14 DIAGNOSIS — Z89431 Acquired absence of right foot: Secondary | ICD-10-CM | POA: Diagnosis not present

## 2021-08-14 NOTE — Progress Notes (Signed)
Office Visit Note   Patient: Robert Horn           Date of Birth: [birth year]           MRN: [id number] Visit Date: 08/14/2021              Requested by: [name], MD [address],  [place] [address] PCP: [name], MD  Chief Complaint  Patient presents with   Right Foot - Wound Check      HPI: Patient is an 88 year old gentleman who presents status post right transmetatarsal amputation with a persistent ulceration over the residual limb.  Assessment & Plan: Visit Diagnoses:  1. History of transmetatarsal amputation of right foot ([place])   2. Right foot ulcer, limited to breakdown of skin ([place])     Plan: The callus was pared no open wound continue current activities reevaluate in 4 weeks.  Follow-Up Instructions: Return in about 4 weeks (around 09/11/2021).   Ortho Exam  Patient is alert, oriented, no adenopathy, well-dressed, normal affect, normal respiratory effort. Examination patient has a callus but no open ulcers the callus was debrided there was good healthy viable tissue no cellulitis no odor no drainage.  Imaging: No results found. No images are attached to the encounter.  Labs: Lab Results  Component Value Date   HGBA1C 6.9 (H) 03/13/2021   HGBA1C 6.9 (H) 11/09/2020   HGBA1C 7.0 (H) 01/31/2019   REPTSTATUS 03/18/2021 FINAL 03/16/2021   CULT >=100,000 COLONIES/mL KLEBSIELLA PNEUMONIAE (A) 03/16/2021   LABORGA KLEBSIELLA PNEUMONIAE (A) 03/16/2021     Lab Results  Component Value Date   ALBUMIN 3.6 03/16/2021   ALBUMIN 3.7 03/12/2021   ALBUMIN 2.3 (L) 02/06/2019    Lab Results  Component Value Date   MG 2.1 02/06/2019   MG 2.3 02/05/2019   MG 2.3 02/04/2019   No results found for: VD25OH  No results found for: PREALBUMIN    Latest Ref Rng & Units 03/17/2021    6:36 PM 03/16/2021    4:27 PM 03/16/2021    4:23 PM  CBC EXTENDED  WBC 4.0 - 10.5 K/uL 4.4    6.0    RBC 4.22 - 5.81 MIL/uL 4.65    4.76     Hemoglobin 13.0 - 17.0 g/dL 13.9   15.0   13.6    HCT 39.0 - 52.0 % 42.2   44.0   43.7    Platelets 150 - 400 K/uL PLATELET CLUMPS NOTED ON SMEAR, UNABLE TO ESTIMATE    PLATELET CLUMPS NOTED ON SMEAR, UNABLE TO ESTIMATE    NEUT# 1.7 - 7.7 K/uL 1.6    3.3    Lymph# 0.7 - 4.0 K/uL 1.6    1.1       There is no height or weight on file to calculate BMI.  Orders:  No orders of the defined types were placed in this encounter.  No orders of the defined types were placed in this encounter.    Procedures: No procedures performed  Clinical Data: No additional findings.  ROS:  All other systems negative, except as noted in the HPI. Review of Systems  Objective: Vital Signs: There were no vitals taken for this visit.  Specialty Comments:  No specialty comments available.  PMFS History: Patient Active Problem List   Diagnosis Date Noted   COVID-19 virus infection 03/16/2021   TIA (transient ischemic attack) 03/12/2021   Diabetes mellitus type 2, controlled ([place]) 03/12/2021   Right foot ulcer,  limited to breakdown of skin ([place]) 02/28/2020   Osteomyelitis of second toe of right foot ([place])    Osteomyelitis of great toe of right foot ([facility])    Severe protein-calorie malnutrition ([place])    Diabetic polyneuropathy associated with type 2 diabetes mellitus ([place])    Cutaneous abscess of right foot    Cellulitis of right foot 01/30/2019   CKD (chronic kidney disease), stage III ([place]) 01/30/2019   Elevated LFTs 01/30/2019   Colon cancer ([place]) 01/30/2019   Anemia of chronic disease 01/30/2019   Acute on chronic combined systolic and diastolic CHF (congestive heart failure) ([place]) 03/09/2018   AKI (acute kidney injury) ([place]) 03/09/2018   Colonic mass 12/10/2017   S/P TAVR (transcatheter aortic valve replacement) 03/03/2017   Severe aortic stenosis 12/15/2016   Essential hypertension 05/09/2013   Hyperlipidemia 05/09/2013   PVC's (premature ventricular contractions) 05/09/2013   Chronic  combined systolic and diastolic heart failure ([place]) 05/09/2013   Past Medical History:  Diagnosis Date   Anemia    Anxiety    situational- surgery   Asthma    as a child   Cancer ([place]) 2019   colon- colectomy    CHF (congestive heart failure) ([place])    Chronic kidney disease    followed by Dr. Delfina Redwood   Coronary artery disease    Diabetes mellitus without complication ([place])    Type II   Dyslipidemia 10/27/2015   Dyspnea    w/ exertion    Elevated PSA 10/27/2015   Erectile dysfunction 10/27/2015   Heart murmur    Hypertension 10/27/2015   Hypogonadism male 10/27/2015   Neuropathy    Obesity 10/27/2015   Peripheral vascular disease ([place])    Pneumonia 10/27/2015   pt states was 1982   Rotator cuff tear 10/27/2015    Family History  Problem Relation Age of Onset   Diabetes Mother    Heart disease Mother    Pulmonary embolism Father     Past Surgical History:  Procedure Laterality Date   ABDOMINAL AORTOGRAM N/A 02/01/2019   Procedure: ABDOMINAL AORTOGRAM;  Surgeon: Marty Heck, MD;  Location: [facility];  Service: Cardiovascular;  Laterality: N/A;   AMPUTATION Right 02/03/2019   Procedure: RIGHT FOOT 1ST RAY AMPUTATION;  Surgeon: [name], MD;  Location: [facility];  Service: Orthopedics;  Laterality: Right;   AMPUTATION Right 08/02/2019   Procedure: RIGHT 2ND TOE AMPUTATION;  Surgeon: [name], MD;  Location: [facility];  Service: Orthopedics;  Laterality: Right;   AMPUTATION Right 11/08/2020   Procedure: RIGHT TRANSMETATARSAL AMPUTATION;  Surgeon: [name], MD;  Location: [facility];  Service: Orthopedics;  Laterality: Right;   APPLICATION OF WOUND VAC  11/08/2020   Procedure: APPLICATION OF WOUND VAC;  Surgeon: [name], MD;  Location: [facility];  Service: Orthopedics;;   CARDIAC CATHETERIZATION N/A 11/14/2015   Procedure: Left Heart Cath and Coronary Angiography;  Surgeon: [name], MD;  Location: [facility];  Service: Cardiovascular;   Laterality: N/A;   COLONOSCOPY     LAPAROSCOPIC PARTIAL COLECTOMY  12/15/2017   LAPAROSCOPIC PARTIAL COLECTOMY (N/A Abdomen)   LAPAROSCOPIC PARTIAL COLECTOMY N/A 12/15/2017   Procedure: LAPAROSCOPIC PARTIAL COLECTOMY;  Surgeon: Erroll Luna, MD;  Location: [facility];  Service: General;  Laterality: N/A;   LOWER EXTREMITY ANGIOGRAPHY Right 02/01/2019   Procedure: LOWER EXTREMITY ANGIOGRAPHY;  Surgeon: Marty Heck, MD;  Location: [facility];  Service: Cardiovascular;  Laterality: Right;  PERIPHERAL VASCULAR INTERVENTION Right 02/01/2019   Procedure: PERIPHERAL VASCULAR INTERVENTION;  Surgeon: Marty Heck, MD;  Location: [facility];  Service: Cardiovascular;  Laterality: Right;  SFA   TEE WITHOUT CARDIOVERSION N/A 12/15/2016   Procedure: TRANSESOPHAGEAL ECHOCARDIOGRAM (TEE);  Surgeon: Sherren Mocha, MD;  Location: [facility];  Service: Open Heart Surgery;  Laterality: N/A;   TRANSCATHETER AORTIC VALVE REPLACEMENT, TRANSFEMORAL N/A 12/15/2016   Procedure: TRANSCATHETER AORTIC VALVE REPLACEMENT, TRANSFEMORAL;  Surgeon: Sherren Mocha, MD;  Location: [facility];  Service: Open Heart Surgery;  Laterality: N/A;   Social History   Occupational History   Not on file  Tobacco Use   Smoking status: Former    Types: Cigarettes   Smokeless tobacco: Never  Vaping Use   Vaping Use: Never used  Substance and Sexual Activity   Alcohol use: Yes    Comment: rarely   Drug use: No   Sexual activity: Not on file

## 2021-08-18 DIAGNOSIS — Z0000 Encounter for general adult medical examination without abnormal findings: Secondary | ICD-10-CM | POA: Diagnosis not present

## 2021-08-18 DIAGNOSIS — I10 Essential (primary) hypertension: Secondary | ICD-10-CM | POA: Diagnosis not present

## 2021-08-18 DIAGNOSIS — I5040 Unspecified combined systolic (congestive) and diastolic (congestive) heart failure: Secondary | ICD-10-CM | POA: Diagnosis not present

## 2021-08-18 DIAGNOSIS — E7800 Pure hypercholesterolemia, unspecified: Secondary | ICD-10-CM | POA: Diagnosis not present

## 2021-08-18 DIAGNOSIS — G459 Transient cerebral ischemic attack, unspecified: Secondary | ICD-10-CM | POA: Diagnosis not present

## 2021-08-18 DIAGNOSIS — N1831 Chronic kidney disease, stage 3a: Secondary | ICD-10-CM | POA: Diagnosis not present

## 2021-08-18 DIAGNOSIS — I739 Peripheral vascular disease, unspecified: Secondary | ICD-10-CM | POA: Diagnosis not present

## 2021-08-18 DIAGNOSIS — E1122 Type 2 diabetes mellitus with diabetic chronic kidney disease: Secondary | ICD-10-CM | POA: Diagnosis not present

## 2021-08-18 DIAGNOSIS — Z1331 Encounter for screening for depression: Secondary | ICD-10-CM | POA: Diagnosis not present

## 2021-08-18 DIAGNOSIS — Z952 Presence of prosthetic heart valve: Secondary | ICD-10-CM | POA: Diagnosis not present

## 2021-08-18 DIAGNOSIS — E1159 Type 2 diabetes mellitus with other circulatory complications: Secondary | ICD-10-CM | POA: Diagnosis not present

## 2021-09-11 ENCOUNTER — Ambulatory Visit (INDEPENDENT_AMBULATORY_CARE_PROVIDER_SITE_OTHER): Payer: Medicare HMO | Admitting: Orthopedic Surgery

## 2021-09-11 DIAGNOSIS — Z89431 Acquired absence of right foot: Secondary | ICD-10-CM

## 2021-09-11 DIAGNOSIS — L97511 Non-pressure chronic ulcer of other part of right foot limited to breakdown of skin: Secondary | ICD-10-CM

## 2021-09-14 ENCOUNTER — Encounter: Payer: Self-pay | Admitting: Orthopedic Surgery

## 2021-09-14 NOTE — Progress Notes (Signed)
Office Visit Note   Patient: Robert Horn           Date of Birth: 01/11/1934           MRN: [id number] Visit Date: 09/11/2021              Requested by: [name], MD [address] [place],  [place] [address] PCP: [name], MD  Chief Complaint  Patient presents with   Right Foot - Follow-up    Hx transmet amputation 10/2020      HPI: Patient is an 88 year old gentleman status post right transmetatarsal amputation August 2022 he has persistent ulcer over the residual limb.  He is wearing compression socks diabetic shoes with inserts.  Assessment & Plan: Visit Diagnoses:  1. History of transmetatarsal amputation of right foot ([place])   2. Right foot ulcer, limited to breakdown of skin ([place])     Plan: Continue with protective shoe wear.  Stretch the Achilles.  Follow-Up Instructions: Return in about 4 weeks (around 10/09/2021).   Ortho Exam  Patient is alert, oriented, no adenopathy, well-dressed, normal affect, normal respiratory effort. Examination patient has dorsiflexion to neutral of the right ankle.  Patient has a Wagner grade 1 ulcer over the residual limb right transmetatarsal amputation.  After informed consent a 10 blade knife was used debride the skin and soft tissue back to healthy viable granulation tissue the wound was 10 mm in diameter and 1 mm deep after debridement.  Imaging: No results found.   Labs: Lab Results  Component Value Date   HGBA1C 6.9 (H) 03/13/2021   HGBA1C 6.9 (H) 11/09/2020   HGBA1C 7.0 (H) 01/31/2019   REPTSTATUS 03/18/2021 FINAL 03/16/2021   CULT >=100,000 COLONIES/mL KLEBSIELLA PNEUMONIAE (A) 03/16/2021   LABORGA KLEBSIELLA PNEUMONIAE (A) 03/16/2021     Lab Results  Component Value Date   ALBUMIN 3.6 03/16/2021   ALBUMIN 3.7 03/12/2021   ALBUMIN 2.3 (L) 02/06/2019    Lab Results  Component Value Date   MG 2.1 02/06/2019   MG 2.3 02/05/2019   MG 2.3 02/04/2019   No results found for:  "VD25OH"  No results found for: "PREALBUMIN"    Latest Ref Rng & Units 03/17/2021    6:36 PM 03/16/2021    4:27 PM 03/16/2021    4:23 PM  CBC EXTENDED  WBC 4.0 - 10.5 K/uL 4.4   6.0   RBC 4.22 - 5.81 MIL/uL 4.65   4.76   Hemoglobin 13.0 - 17.0 g/dL 13.9  15.0  13.6   HCT 39.0 - 52.0 % 42.2  44.0  43.7   Platelets 150 - 400 K/uL PLATELET CLUMPS NOTED ON SMEAR, UNABLE TO ESTIMATE   PLATELET CLUMPS NOTED ON SMEAR, UNABLE TO ESTIMATE   NEUT# 1.7 - 7.7 K/uL 1.6   3.3   Lymph# 0.7 - 4.0 K/uL 1.6   1.1      There is no height or weight on file to calculate BMI.  Orders:  No orders of the defined types were placed in this encounter.  No orders of the defined types were placed in this encounter.    Procedures: No procedures performed  Clinical Data: No additional findings.  ROS:  All other systems negative, except as noted in the HPI. Review of Systems  Objective: Vital Signs: There were no vitals taken for this visit.  Specialty Comments:  No specialty comments available.  PMFS History: Patient Active Problem List   Diagnosis Date Noted   COVID-19  virus infection 03/16/2021   TIA (transient ischemic attack) 03/12/2021   Diabetes mellitus type 2, controlled ([place]) 03/12/2021   Right foot ulcer, limited to breakdown of skin ([place]) 02/28/2020   Osteomyelitis of second toe of right foot ([facility])    Osteomyelitis of great toe of right foot ([facility])    Severe protein-calorie malnutrition ([place])    Diabetic polyneuropathy associated with type 2 diabetes mellitus ([place])    Cutaneous abscess of right foot    Cellulitis of right foot 01/30/2019   CKD (chronic kidney disease), stage III ([place]) 01/30/2019   Elevated LFTs 01/30/2019   Colon cancer ([place]) 01/30/2019   Anemia of chronic disease 01/30/2019   Acute on chronic combined systolic and diastolic CHF (congestive heart failure) ([place]) 03/09/2018   AKI (acute kidney injury) ([place]) 03/09/2018   Colonic mass 12/10/2017   S/P TAVR  (transcatheter aortic valve replacement) 03/03/2017   Severe aortic stenosis 12/15/2016   Essential hypertension 05/09/2013   Hyperlipidemia 05/09/2013   PVC's (premature ventricular contractions) 05/09/2013   Chronic combined systolic and diastolic heart failure ([place]) 05/09/2013   Past Medical History:  Diagnosis Date   Anemia    Anxiety    situational- surgery   Asthma    as a child   Cancer ([place]) 2019   colon- colectomy    CHF (congestive heart failure) ([place])    Chronic kidney disease    followed by Dr. Delfina Redwood   Coronary artery disease    Diabetes mellitus without complication ([place])    Type II   Dyslipidemia 10/27/2015   Dyspnea    w/ exertion    Elevated PSA 10/27/2015   Erectile dysfunction 10/27/2015   Heart murmur    Hypertension 10/27/2015   Hypogonadism male 10/27/2015   Neuropathy    Obesity 10/27/2015   Peripheral vascular disease ([place])    Pneumonia 10/27/2015   pt states was 1982   Rotator cuff tear 10/27/2015    Family History  Problem Relation Age of Onset   Diabetes Mother    Heart disease Mother    Pulmonary embolism Father     Past Surgical History:  Procedure Laterality Date   ABDOMINAL AORTOGRAM N/A 02/01/2019   Procedure: ABDOMINAL AORTOGRAM;  Surgeon: Marty Heck, MD;  Location: [facility];  Service: Cardiovascular;  Laterality: N/A;   AMPUTATION Right 02/03/2019   Procedure: RIGHT FOOT 1ST RAY AMPUTATION;  Surgeon: [name], MD;  Location: [facility];  Service: Orthopedics;  Laterality: Right;   AMPUTATION Right 08/02/2019   Procedure: RIGHT 2ND TOE AMPUTATION;  Surgeon: [name], MD;  Location: [facility];  Service: Orthopedics;  Laterality: Right;   AMPUTATION Right 11/08/2020   Procedure: RIGHT TRANSMETATARSAL AMPUTATION;  Surgeon: [name], MD;  Location: [facility];  Service: Orthopedics;  Laterality: Right;   APPLICATION OF WOUND VAC  11/08/2020   Procedure: APPLICATION OF WOUND VAC;  Surgeon: [name], MD;   Location: [facility];  Service: Orthopedics;;   CARDIAC CATHETERIZATION N/A 11/14/2015   Procedure: Left Heart Cath and Coronary Angiography;  Surgeon: [name], MD;  Location: [facility];  Service: Cardiovascular;  Laterality: N/A;   COLONOSCOPY     LAPAROSCOPIC PARTIAL COLECTOMY  12/15/2017   LAPAROSCOPIC PARTIAL COLECTOMY (N/A Abdomen)   LAPAROSCOPIC PARTIAL COLECTOMY N/A 12/15/2017   Procedure: LAPAROSCOPIC PARTIAL COLECTOMY;  Surgeon: Erroll Luna, MD;  Location: [facility];  Service: General;  Laterality: N/A;   LOWER EXTREMITY ANGIOGRAPHY Right 02/01/2019  Procedure: LOWER EXTREMITY ANGIOGRAPHY;  Surgeon: Marty Heck, MD;  Location: [facility];  Service: Cardiovascular;  Laterality: Right;   PERIPHERAL VASCULAR INTERVENTION Right 02/01/2019   Procedure: PERIPHERAL VASCULAR INTERVENTION;  Surgeon: Marty Heck, MD;  Location: [place];  Service: Cardiovascular;  Laterality: Right;  SFA   TEE WITHOUT CARDIOVERSION N/A 12/15/2016   Procedure: TRANSESOPHAGEAL ECHOCARDIOGRAM (TEE);  Surgeon: Sherren Mocha, MD;  Location: [facility];  Service: Open Heart Surgery;  Laterality: N/A;   TRANSCATHETER AORTIC VALVE REPLACEMENT, TRANSFEMORAL N/A 12/15/2016   Procedure: TRANSCATHETER AORTIC VALVE REPLACEMENT, TRANSFEMORAL;  Surgeon: Sherren Mocha, MD;  Location: [facility];  Service: Open Heart Surgery;  Laterality: N/A;   Social History   Occupational History   Not on file  Tobacco Use   Smoking status: Former    Types: Cigarettes   Smokeless tobacco: Never  Vaping Use   Vaping Use: Never used  Substance and Sexual Activity   Alcohol use: Yes    Comment: rarely   Drug use: No   Sexual activity: Not on file

## 2021-10-16 ENCOUNTER — Encounter: Payer: Self-pay | Admitting: Orthopedic Surgery

## 2021-10-16 ENCOUNTER — Ambulatory Visit: Payer: Medicare HMO | Admitting: Orthopedic Surgery

## 2021-10-16 DIAGNOSIS — Z89431 Acquired absence of right foot: Secondary | ICD-10-CM

## 2021-10-16 DIAGNOSIS — L97511 Non-pressure chronic ulcer of other part of right foot limited to breakdown of skin: Secondary | ICD-10-CM

## 2021-10-16 NOTE — Progress Notes (Signed)
Office Visit Note   Patient: Robert Horn           Date of Birth: 06/15/1933           MRN: [id number] Visit Date: 10/16/2021              Requested by: [name], MD [address] [place],  [place] [address] PCP: [name], MD  Chief Complaint  Patient presents with   Right Foot - Wound Check      HPI: Patient is an 88 year old gentleman who is presents in follow-up for right transmetatarsal amputation with persistent ulceration.  Patient is currently wearing protective shoe wear custom orthotics and doing Achilles stretching.  Assessment & Plan: Visit Diagnoses:  1. History of transmetatarsal amputation of right foot ([place])   2. Right foot ulcer, limited to breakdown of skin ([place])     Plan: Continue with Achilles stretching and protective shoe wear.  Follow-Up Instructions: Return in about 4 weeks (around 11/13/2021).   Ortho Exam  Patient is alert, oriented, no adenopathy, well-dressed, normal affect, normal respiratory effort. Examination patient's foot is plantigrade there is no equinus contracture.  There is no redness or cellulitis or drainage.  Patient has a persistent Wagner grade 1 ulcer which is improving.  After informed consent a 10 blade knife was used to debride the skin and soft tissue back to healthy viable tissue.  After debridement the wound was 10 mm in diameter 1 mm deep.  Imaging: No results found. No images are attached to the encounter.  Labs: Lab Results  Component Value Date   HGBA1C 6.9 (H) 03/13/2021   HGBA1C 6.9 (H) 11/09/2020   HGBA1C 7.0 (H) 01/31/2019   REPTSTATUS 03/18/2021 FINAL 03/16/2021   CULT >=100,000 COLONIES/mL KLEBSIELLA PNEUMONIAE (A) 03/16/2021   LABORGA KLEBSIELLA PNEUMONIAE (A) 03/16/2021     Lab Results  Component Value Date   ALBUMIN 3.6 03/16/2021   ALBUMIN 3.7 03/12/2021   ALBUMIN 2.3 (L) 02/06/2019    Lab Results  Component Value Date   MG 2.1 02/06/2019   MG 2.3 02/05/2019   MG  2.3 02/04/2019   No results found for: "VD25OH"  No results found for: "PREALBUMIN"    Latest Ref Rng & Units 03/17/2021    6:36 PM 03/16/2021    4:27 PM 03/16/2021    4:23 PM  CBC EXTENDED  WBC 4.0 - 10.5 K/uL 4.4   6.0   RBC 4.22 - 5.81 MIL/uL 4.65   4.76   Hemoglobin 13.0 - 17.0 g/dL 13.9  15.0  13.6   HCT 39.0 - 52.0 % 42.2  44.0  43.7   Platelets 150 - 400 K/uL PLATELET CLUMPS NOTED ON SMEAR, UNABLE TO ESTIMATE   PLATELET CLUMPS NOTED ON SMEAR, UNABLE TO ESTIMATE   NEUT# 1.7 - 7.7 K/uL 1.6   3.3   Lymph# 0.7 - 4.0 K/uL 1.6   1.1      There is no height or weight on file to calculate BMI.  Orders:  No orders of the defined types were placed in this encounter.  No orders of the defined types were placed in this encounter.    Procedures: No procedures performed  Clinical Data: No additional findings.  ROS:  All other systems negative, except as noted in the HPI. Review of Systems  Objective: Vital Signs: There were no vitals taken for this visit.  Specialty Comments:  No specialty comments available.  PMFS History: Patient Active Problem List  Diagnosis Date Noted   COVID-19 virus infection 03/16/2021   TIA (transient ischemic attack) 03/12/2021   Diabetes mellitus type 2, controlled ([place]) 03/12/2021   Right foot ulcer, limited to breakdown of skin ([place]) 02/28/2020   Osteomyelitis of second toe of right foot (HCC)    Osteomyelitis of great toe of right foot (HCC)    Severe protein-calorie malnutrition ([place])    Diabetic polyneuropathy associated with type 2 diabetes mellitus ([place])    Cutaneous abscess of right foot    Cellulitis of right foot 01/30/2019   CKD (chronic kidney disease), stage III ([place]) 01/30/2019   Elevated LFTs 01/30/2019   Colon cancer ([place]) 01/30/2019   Anemia of chronic disease 01/30/2019   Acute on chronic combined systolic and diastolic CHF (congestive heart failure) ([place]) 03/09/2018   AKI (acute kidney injury) ([place]) 03/09/2018    Colonic mass 12/10/2017   S/P TAVR (transcatheter aortic valve replacement) 03/03/2017   Severe aortic stenosis 12/15/2016   Essential hypertension 05/09/2013   Hyperlipidemia 05/09/2013   PVC's (premature ventricular contractions) 05/09/2013   Chronic combined systolic and diastolic heart failure ([place]) 05/09/2013   Past Medical History:  Diagnosis Date   Anemia    Anxiety    situational- surgery   Asthma    as a child   Cancer ([place]) 2019   colon- colectomy    CHF (congestive heart failure) ([place])    Chronic kidney disease    followed by Dr. Delfina Redwood   Coronary artery disease    Diabetes mellitus without complication ([place])    Type II   Dyslipidemia 10/27/2015   Dyspnea    w/ exertion    Elevated PSA 10/27/2015   Erectile dysfunction 10/27/2015   Heart murmur    Hypertension 10/27/2015   Hypogonadism male 10/27/2015   Neuropathy    Obesity 10/27/2015   Peripheral vascular disease ([place])    Pneumonia 10/27/2015   pt states was 1982   Rotator cuff tear 10/27/2015    Family History  Problem Relation Age of Onset   Diabetes Mother    Heart disease Mother    Pulmonary embolism Father     Past Surgical History:  Procedure Laterality Date   ABDOMINAL AORTOGRAM N/A 02/01/2019   Procedure: ABDOMINAL AORTOGRAM;  Surgeon: Marty Heck, MD;  Location: [facility];  Service: Cardiovascular;  Laterality: N/A;   AMPUTATION Right 02/03/2019   Procedure: RIGHT FOOT 1ST RAY AMPUTATION;  Surgeon: [name], MD;  Location: [facility];  Service: Orthopedics;  Laterality: Right;   AMPUTATION Right 08/02/2019   Procedure: RIGHT 2ND TOE AMPUTATION;  Surgeon: [name], MD;  Location: [facility];  Service: Orthopedics;  Laterality: Right;   AMPUTATION Right 11/08/2020   Procedure: RIGHT TRANSMETATARSAL AMPUTATION;  Surgeon: [name], MD;  Location: [facility];  Service: Orthopedics;  Laterality: Right;   APPLICATION OF WOUND VAC  11/08/2020   Procedure: APPLICATION OF WOUND VAC;   Surgeon: [name], MD;  Location: [facility];  Service: Orthopedics;;   CARDIAC CATHETERIZATION N/A 11/14/2015   Procedure: Left Heart Cath and Coronary Angiography;  Surgeon: [name], MD;  Location: [facility];  Service: Cardiovascular;  Laterality: N/A;   COLONOSCOPY     LAPAROSCOPIC PARTIAL COLECTOMY  12/15/2017   LAPAROSCOPIC PARTIAL COLECTOMY (N/A Abdomen)   LAPAROSCOPIC PARTIAL COLECTOMY N/A 12/15/2017   Procedure: LAPAROSCOPIC PARTIAL COLECTOMY;  Surgeon: Erroll Luna, MD;  Location: [facility];  Service: General;  Laterality: N/A;   LOWER  EXTREMITY ANGIOGRAPHY Right 02/01/2019   Procedure: LOWER EXTREMITY ANGIOGRAPHY;  Surgeon: Marty Heck, MD;  Location: [facility];  Service: Cardiovascular;  Laterality: Right;   PERIPHERAL VASCULAR INTERVENTION Right 02/01/2019   Procedure: PERIPHERAL VASCULAR INTERVENTION;  Surgeon: Marty Heck, MD;  Location: [facility];  Service: Cardiovascular;  Laterality: Right;  SFA   TEE WITHOUT CARDIOVERSION N/A 12/15/2016   Procedure: TRANSESOPHAGEAL ECHOCARDIOGRAM (TEE);  Surgeon: Sherren Mocha, MD;  Location: [facility];  Service: Open Heart Surgery;  Laterality: N/A;   TRANSCATHETER AORTIC VALVE REPLACEMENT, TRANSFEMORAL N/A 12/15/2016   Procedure: TRANSCATHETER AORTIC VALVE REPLACEMENT, TRANSFEMORAL;  Surgeon: Sherren Mocha, MD;  Location: [facility];  Service: Open Heart Surgery;  Laterality: N/A;   Social History   Occupational History   Not on file  Tobacco Use   Smoking status: Former    Types: Cigarettes   Smokeless tobacco: Never  Vaping Use   Vaping Use: Never used  Substance and Sexual Activity   Alcohol use: Yes    Comment: rarely   Drug use: No   Sexual activity: Not on file

## 2021-10-31 ENCOUNTER — Other Ambulatory Visit: Payer: Self-pay | Admitting: Interventional Cardiology

## 2021-11-11 ENCOUNTER — Other Ambulatory Visit: Payer: Self-pay | Admitting: Interventional Cardiology

## 2021-11-11 MED ORDER — OMEGA-3-ACID ETHYL ESTERS 1 G PO CAPS: 1.0000 g | ORAL_CAPSULE | Freq: Every day | ORAL | 2 refills | Status: DC

## 2021-11-13 ENCOUNTER — Ambulatory Visit: Payer: Medicare HMO | Admitting: Orthopedic Surgery

## 2021-11-13 DIAGNOSIS — Z89431 Acquired absence of right foot: Secondary | ICD-10-CM | POA: Diagnosis not present

## 2021-11-13 DIAGNOSIS — L97511 Non-pressure chronic ulcer of other part of right foot limited to breakdown of skin: Secondary | ICD-10-CM | POA: Diagnosis not present

## 2021-12-02 ENCOUNTER — Ambulatory Visit: Payer: Medicare HMO | Attending: Cardiovascular Disease

## 2021-12-02 DIAGNOSIS — N182 Chronic kidney disease, stage 2 (mild): Secondary | ICD-10-CM | POA: Diagnosis not present

## 2021-12-02 DIAGNOSIS — I10 Essential (primary) hypertension: Secondary | ICD-10-CM | POA: Diagnosis not present

## 2021-12-02 LAB — BASIC METABOLIC PANEL
BUN/Creatinine Ratio: 13 (ref 10–24)
BUN: 20 mg/dL (ref 8–27)
CO2: 22 mmol/L (ref 20–29)
Calcium: 9.3 mg/dL (ref 8.6–10.2)
Chloride: 103 mmol/L (ref 96–106)
Creatinine, Ser: 1.58 mg/dL — ABNORMAL HIGH (ref 0.76–1.27)
Glucose: 134 mg/dL — ABNORMAL HIGH (ref 70–99)
Potassium: 4.1 mmol/L (ref 3.5–5.2)
Sodium: 140 mmol/L (ref 134–144)
eGFR: 42 mL/min/{1.73_m2} — ABNORMAL LOW (ref >59)

## 2021-12-05 ENCOUNTER — Telehealth: Payer: Self-pay

## 2021-12-05 NOTE — Telephone Encounter (Signed)
Left message for patient notifying him his lab results have been reviewed. Will released result note to MyChart for patient to see. Provided office number for callback if any questions.

## 2021-12-05 NOTE — Telephone Encounter (Signed)
-----  Message from [name], MD sent at 12/04/2021  5:47 PM EDT ----- Let the patient know the kidney function and potassium are very stable.  Be met in 6 months.  Let the patient know I am retiring in January. A copy will be sent to Stacie Glaze, DO

## 2021-12-10 ENCOUNTER — Other Ambulatory Visit: Payer: Self-pay | Admitting: Interventional Cardiology

## 2021-12-11 ENCOUNTER — Ambulatory Visit: Payer: Medicare HMO | Admitting: Orthopedic Surgery

## 2021-12-11 ENCOUNTER — Encounter: Payer: Self-pay | Admitting: Orthopedic Surgery

## 2021-12-11 DIAGNOSIS — L97511 Non-pressure chronic ulcer of other part of right foot limited to breakdown of skin: Secondary | ICD-10-CM | POA: Diagnosis not present

## 2021-12-11 DIAGNOSIS — Z89431 Acquired absence of right foot: Secondary | ICD-10-CM

## 2021-12-11 NOTE — Progress Notes (Signed)
Office Visit Note   Patient: Robert Horn           Date of Birth: 04-06-1933           MRN: [id number] Visit Date: 12/11/2021              Requested by: [name], MD [address] [place],  [place] [address] PCP: [name], MD  Chief Complaint  Patient presents with   Right Foot - Follow-up      HPI: Prove.  Reevaluate in 4 weeks. Patient is an 88 year old gentleman who presents in follow-up for insensate neuropathic ulcer right foot status post transmetatarsal amputation.  Assessment & Plan: Visit Diagnoses:  1. History of transmetatarsal amputation of right foot ([place])   2. Right foot ulcer, limited to breakdown of skin ([place])     Plan: Patient's ulcer continues doing  Follow-Up Instructions: Return in about 4 weeks (around 01/08/2022).   Ortho Exam  Patient is alert, oriented, no adenopathy, well-dressed, normal affect, normal respiratory effort. Examination there is no redness or cellulitis patient's foot is plantigrade on the right foot.  He has a Wagner grade 1 ulcer over the end of the residual limb.  After informed consent a 10 blade knife was used to debride the skin and soft tissue back to healthy viable granulation tissue the ulcer is 2 cm in diameter and 2 mm deep, after debridement there is healthy granulation tissue no tunneling no exposed bone or tendon no abscess no cellulitis.  Imaging: No results found. No images are attached to the encounter.  Labs: Lab Results  Component Value Date   HGBA1C 6.9 (H) 03/13/2021   HGBA1C 6.9 (H) 11/09/2020   HGBA1C 7.0 (H) 01/31/2019   REPTSTATUS 03/18/2021 FINAL 03/16/2021   CULT >=100,000 COLONIES/mL KLEBSIELLA PNEUMONIAE (A) 03/16/2021   LABORGA KLEBSIELLA PNEUMONIAE (A) 03/16/2021     Lab Results  Component Value Date   ALBUMIN 3.6 03/16/2021   ALBUMIN 3.7 03/12/2021   ALBUMIN 2.3 (L) 02/06/2019    Lab Results  Component Value Date   MG 2.1 02/06/2019   MG 2.3 02/05/2019   MG  2.3 02/04/2019   No results found for: "VD25OH"  No results found for: "PREALBUMIN"    Latest Ref Rng & Units 03/17/2021    6:36 PM 03/16/2021    4:27 PM 03/16/2021    4:23 PM  CBC EXTENDED  WBC 4.0 - 10.5 K/uL 4.4   6.0   RBC 4.22 - 5.81 MIL/uL 4.65   4.76   Hemoglobin 13.0 - 17.0 g/dL 13.9  15.0  13.6   HCT 39.0 - 52.0 % 42.2  44.0  43.7   Platelets 150 - 400 K/uL PLATELET CLUMPS NOTED ON SMEAR, UNABLE TO ESTIMATE   PLATELET CLUMPS NOTED ON SMEAR, UNABLE TO ESTIMATE   NEUT# 1.7 - 7.7 K/uL 1.6   3.3   Lymph# 0.7 - 4.0 K/uL 1.6   1.1      There is no height or weight on file to calculate BMI.  Orders:  No orders of the defined types were placed in this encounter.  No orders of the defined types were placed in this encounter.    Procedures: No procedures performed  Clinical Data: No additional findings.  ROS:  All other systems negative, except as noted in the HPI. Review of Systems  Objective: Vital Signs: There were no vitals taken for this visit.  Specialty Comments:  No specialty comments available.  PMFS History: Patient  Active Problem List   Diagnosis Date Noted   COVID-19 virus infection 03/16/2021   TIA (transient ischemic attack) 03/12/2021   Diabetes mellitus type 2, controlled ([place]) 03/12/2021   Right foot ulcer, limited to breakdown of skin ([place]) 02/28/2020   Osteomyelitis of second toe of right foot ([place])    Osteomyelitis of great toe of right foot (HCC)    Severe protein-calorie malnutrition ([place])    Diabetic polyneuropathy associated with type 2 diabetes mellitus ([place])    Cutaneous abscess of right foot    Cellulitis of right foot 01/30/2019   CKD (chronic kidney disease), stage III ([place]) 01/30/2019   Elevated LFTs 01/30/2019   Colon cancer ([place]) 01/30/2019   Anemia of chronic disease 01/30/2019   Acute on chronic combined systolic and diastolic CHF (congestive heart failure) ([place]) 03/09/2018   AKI (acute kidney injury) ([place]) 03/09/2018    Colonic mass 12/10/2017   S/P TAVR (transcatheter aortic valve replacement) 03/03/2017   Severe aortic stenosis 12/15/2016   Essential hypertension 05/09/2013   Hyperlipidemia 05/09/2013   PVC's (premature ventricular contractions) 05/09/2013   Chronic combined systolic and diastolic heart failure ([place]) 05/09/2013   Past Medical History:  Diagnosis Date   Anemia    Anxiety    situational- surgery   Asthma    as a child   Cancer ([place]) 2019   colon- colectomy    CHF (congestive heart failure) ([place])    Chronic kidney disease    followed by Dr. Delfina Redwood   Coronary artery disease    Diabetes mellitus without complication ([place])    Type II   Dyslipidemia 10/27/2015   Dyspnea    w/ exertion    Elevated PSA 10/27/2015   Erectile dysfunction 10/27/2015   Heart murmur    Hypertension 10/27/2015   Hypogonadism male 10/27/2015   Neuropathy    Obesity 10/27/2015   Peripheral vascular disease ([place])    Pneumonia 10/27/2015   pt states was 1982   Rotator cuff tear 10/27/2015    Family History  Problem Relation Age of Onset   Diabetes Mother    Heart disease Mother    Pulmonary embolism Father     Past Surgical History:  Procedure Laterality Date   ABDOMINAL AORTOGRAM N/A 02/01/2019   Procedure: ABDOMINAL AORTOGRAM;  Surgeon: Marty Heck, MD;  Location: [facility];  Service: Cardiovascular;  Laterality: N/A;   AMPUTATION Right 02/03/2019   Procedure: RIGHT FOOT 1ST RAY AMPUTATION;  Surgeon: [name], MD;  Location: [facility];  Service: Orthopedics;  Laterality: Right;   AMPUTATION Right 08/02/2019   Procedure: RIGHT 2ND TOE AMPUTATION;  Surgeon: [name], MD;  Location: [facility];  Service: Orthopedics;  Laterality: Right;   AMPUTATION Right 11/08/2020   Procedure: RIGHT TRANSMETATARSAL AMPUTATION;  Surgeon: [name], MD;  Location: [facility];  Service: Orthopedics;  Laterality: Right;   APPLICATION OF WOUND VAC  11/08/2020   Procedure: APPLICATION OF WOUND VAC;   Surgeon: [name], MD;  Location: [facility];  Service: Orthopedics;;   CARDIAC CATHETERIZATION N/A 11/14/2015   Procedure: Left Heart Cath and Coronary Angiography;  Surgeon: [name], MD;  Location: [facility];  Service: Cardiovascular;  Laterality: N/A;   COLONOSCOPY     LAPAROSCOPIC PARTIAL COLECTOMY  12/15/2017   LAPAROSCOPIC PARTIAL COLECTOMY (N/A Abdomen)   LAPAROSCOPIC PARTIAL COLECTOMY N/A 12/15/2017   Procedure: LAPAROSCOPIC PARTIAL COLECTOMY;  Surgeon: Erroll Luna, MD;  Location: [facility];  Service: General;  Laterality: N/A;   LOWER EXTREMITY ANGIOGRAPHY Right 02/01/2019   Procedure: LOWER EXTREMITY ANGIOGRAPHY;  Surgeon: Marty Heck, MD;  Location: [facility];  Service: Cardiovascular;  Laterality: Right;   PERIPHERAL VASCULAR INTERVENTION Right 02/01/2019   Procedure: PERIPHERAL VASCULAR INTERVENTION;  Surgeon: Marty Heck, MD;  Location: [facility];  Service: Cardiovascular;  Laterality: Right;  SFA   TEE WITHOUT CARDIOVERSION N/A 12/15/2016   Procedure: TRANSESOPHAGEAL ECHOCARDIOGRAM (TEE);  Surgeon: Sherren Mocha, MD;  Location: [facility];  Service: Open Heart Surgery;  Laterality: N/A;   TRANSCATHETER AORTIC VALVE REPLACEMENT, TRANSFEMORAL N/A 12/15/2016   Procedure: TRANSCATHETER AORTIC VALVE REPLACEMENT, TRANSFEMORAL;  Surgeon: Sherren Mocha, MD;  Location: [facility];  Service: Open Heart Surgery;  Laterality: N/A;   Social History   Occupational History   Not on file  Tobacco Use   Smoking status: Former    Types: Cigarettes   Smokeless tobacco: Never  Vaping Use   Vaping Use: Never used  Substance and Sexual Activity   Alcohol use: Yes    Comment: rarely   Drug use: No   Sexual activity: Not on file

## 2021-12-15 ENCOUNTER — Encounter: Payer: Self-pay | Admitting: Orthopedic Surgery

## 2021-12-15 NOTE — Progress Notes (Signed)
Office Visit Note   Patient: Robert Horn           Date of Birth: 07-05-1933           MRN: [id number] Visit Date: 11/13/2021              Requested by: [name], MD [address] [place],  [place] [address] PCP: [name], MD  Chief Complaint  Patient presents with   Right Foot - Follow-up    Hx right transmet amputation 11/08/2020      HPI: Patient is an 88 year old gentleman who is status post right transmetatarsal amputation with a Wagner grade 1 ulcer over the residual limb.  Patient has been working on Achilles stretching and protective shoe wear.  Assessment & Plan: Visit Diagnoses:  1. History of transmetatarsal amputation of right foot ([place])   2. Right foot ulcer, limited to breakdown of skin ([place])     Plan: Ulcer was debrided continue current care.  Follow-Up Instructions: Return in about 4 weeks (around 12/11/2021).   Ortho Exam  Patient is alert, oriented, no adenopathy, well-dressed, normal affect, normal respiratory effort. Examination patient has no cellulitis no odor no drainage she does have a Wagner grade 1 ulcer over the residual limb.  After informed consent a 10 blade knife was used to debride the skin and soft tissue back to healthy viable tissue.  Silver nitrate was used for hemostasis there is no tunneling no exposed bone or tendon.  After debridement the ulcer was 2 cm in diameter and 3 mm deep.  Imaging: No results found. No images are attached to the encounter.  Labs: Lab Results  Component Value Date   HGBA1C 6.9 (H) 03/13/2021   HGBA1C 6.9 (H) 11/09/2020   HGBA1C 7.0 (H) 01/31/2019   REPTSTATUS 03/18/2021 FINAL 03/16/2021   CULT >=100,000 COLONIES/mL KLEBSIELLA PNEUMONIAE (A) 03/16/2021   LABORGA KLEBSIELLA PNEUMONIAE (A) 03/16/2021     Lab Results  Component Value Date   ALBUMIN 3.6 03/16/2021   ALBUMIN 3.7 03/12/2021   ALBUMIN 2.3 (L) 02/06/2019    Lab Results  Component Value Date   MG 2.1  02/06/2019   MG 2.3 02/05/2019   MG 2.3 02/04/2019   No results found for: "VD25OH"  No results found for: "PREALBUMIN"    Latest Ref Rng & Units 03/17/2021    6:36 PM 03/16/2021    4:27 PM 03/16/2021    4:23 PM  CBC EXTENDED  WBC 4.0 - 10.5 K/uL 4.4   6.0   RBC 4.22 - 5.81 MIL/uL 4.65   4.76   Hemoglobin 13.0 - 17.0 g/dL 13.9  15.0  13.6   HCT 39.0 - 52.0 % 42.2  44.0  43.7   Platelets 150 - 400 K/uL PLATELET CLUMPS NOTED ON SMEAR, UNABLE TO ESTIMATE   PLATELET CLUMPS NOTED ON SMEAR, UNABLE TO ESTIMATE   NEUT# 1.7 - 7.7 K/uL 1.6   3.3   Lymph# 0.7 - 4.0 K/uL 1.6   1.1      There is no height or weight on file to calculate BMI.  Orders:  No orders of the defined types were placed in this encounter.  No orders of the defined types were placed in this encounter.    Procedures: No procedures performed  Clinical Data: No additional findings.  ROS:  All other systems negative, except as noted in the HPI. Review of Systems  Objective: Vital Signs: There were no vitals taken for this visit.  Specialty  Comments:  No specialty comments available.  PMFS History: Patient Active Problem List   Diagnosis Date Noted   COVID-19 virus infection 03/16/2021   TIA (transient ischemic attack) 03/12/2021   Diabetes mellitus type 2, controlled ([place]) 03/12/2021   Right foot ulcer, limited to breakdown of skin ([place]) 02/28/2020   Osteomyelitis of second toe of right foot (HCC)    Osteomyelitis of great toe of right foot (HCC)    Severe protein-calorie malnutrition ([place])    Diabetic polyneuropathy associated with type 2 diabetes mellitus ([place])    Cutaneous abscess of right foot    Cellulitis of right foot 01/30/2019   CKD (chronic kidney disease), stage III ([place]) 01/30/2019   Elevated LFTs 01/30/2019   Colon cancer ([place]) 01/30/2019   Anemia of chronic disease 01/30/2019   Acute on chronic combined systolic and diastolic CHF (congestive heart failure) ([place]) 03/09/2018   AKI  (acute kidney injury) ([employer]) 03/09/2018   Colonic mass 12/10/2017   S/P TAVR (transcatheter aortic valve replacement) 03/03/2017   Severe aortic stenosis 12/15/2016   Essential hypertension 05/09/2013   Hyperlipidemia 05/09/2013   PVC's (premature ventricular contractions) 05/09/2013   Chronic combined systolic and diastolic heart failure ([place]) 05/09/2013   Past Medical History:  Diagnosis Date   Anemia    Anxiety    situational- surgery   Asthma    as a child   Cancer ([place]) 2019   colon- colectomy    CHF (congestive heart failure) ([place])    Chronic kidney disease    followed by Dr. Delfina Redwood   Coronary artery disease    Diabetes mellitus without complication ([place])    Type II   Dyslipidemia 10/27/2015   Dyspnea    w/ exertion    Elevated PSA 10/27/2015   Erectile dysfunction 10/27/2015   Heart murmur    Hypertension 10/27/2015   Hypogonadism male 10/27/2015   Neuropathy    Obesity 10/27/2015   Peripheral vascular disease ([place])    Pneumonia 10/27/2015   pt states was 1982   Rotator cuff tear 10/27/2015    Family History  Problem Relation Age of Onset   Diabetes Mother    Heart disease Mother    Pulmonary embolism Father     Past Surgical History:  Procedure Laterality Date   ABDOMINAL AORTOGRAM N/A 02/01/2019   Procedure: ABDOMINAL AORTOGRAM;  Surgeon: Marty Heck, MD;  Location: [facility];  Service: Cardiovascular;  Laterality: N/A;   AMPUTATION Right 02/03/2019   Procedure: RIGHT FOOT 1ST RAY AMPUTATION;  Surgeon: [name], MD;  Location: [facility];  Service: Orthopedics;  Laterality: Right;   AMPUTATION Right 08/02/2019   Procedure: RIGHT 2ND TOE AMPUTATION;  Surgeon: [name], MD;  Location: [facility];  Service: Orthopedics;  Laterality: Right;   AMPUTATION Right 11/08/2020   Procedure: RIGHT TRANSMETATARSAL AMPUTATION;  Surgeon: [name], MD;  Location: [facility];  Service: Orthopedics;  Laterality: Right;   APPLICATION OF WOUND VAC  11/08/2020    Procedure: APPLICATION OF WOUND VAC;  Surgeon: [name], MD;  Location: [facility];  Service: Orthopedics;;   CARDIAC CATHETERIZATION N/A 11/14/2015   Procedure: Left Heart Cath and Coronary Angiography;  Surgeon: [name], MD;  Location: [facility];  Service: Cardiovascular;  Laterality: N/A;   COLONOSCOPY     LAPAROSCOPIC PARTIAL COLECTOMY  12/15/2017   LAPAROSCOPIC PARTIAL COLECTOMY (N/A Abdomen)   LAPAROSCOPIC PARTIAL COLECTOMY N/A 12/15/2017   Procedure: LAPAROSCOPIC PARTIAL COLECTOMY;  Surgeon: Brantley Stage,  Marcello Moores, MD;  Location: [facility];  Service: General;  Laterality: N/A;   LOWER EXTREMITY ANGIOGRAPHY Right 02/01/2019   Procedure: LOWER EXTREMITY ANGIOGRAPHY;  Surgeon: Marty Heck, MD;  Location: [facility];  Service: Cardiovascular;  Laterality: Right;   PERIPHERAL VASCULAR INTERVENTION Right 02/01/2019   Procedure: PERIPHERAL VASCULAR INTERVENTION;  Surgeon: Marty Heck, MD;  Location: [facility];  Service: Cardiovascular;  Laterality: Right;  SFA   TEE WITHOUT CARDIOVERSION N/A 12/15/2016   Procedure: TRANSESOPHAGEAL ECHOCARDIOGRAM (TEE);  Surgeon: Sherren Mocha, MD;  Location: [facility];  Service: Open Heart Surgery;  Laterality: N/A;   TRANSCATHETER AORTIC VALVE REPLACEMENT, TRANSFEMORAL N/A 12/15/2016   Procedure: TRANSCATHETER AORTIC VALVE REPLACEMENT, TRANSFEMORAL;  Surgeon: Sherren Mocha, MD;  Location: [facility];  Service: Open Heart Surgery;  Laterality: N/A;   Social History   Occupational History   Not on file  Tobacco Use   Smoking status: Former    Types: Cigarettes   Smokeless tobacco: Never  Vaping Use   Vaping Use: Never used  Substance and Sexual Activity   Alcohol use: Yes    Comment: rarely   Drug use: No   Sexual activity: Not on file

## 2022-01-12 ENCOUNTER — Ambulatory Visit: Payer: Medicare HMO | Admitting: Orthopedic Surgery

## 2022-01-15 ENCOUNTER — Inpatient Hospital Stay: Payer: Medicare HMO | Admitting: Oncology

## 2022-01-15 ENCOUNTER — Inpatient Hospital Stay: Payer: Medicare HMO | Attending: Oncology

## 2022-01-15 VITALS — BP 118/72 | HR 63 | Temp 98.2°F | Resp 18 | Ht 71.0 in | Wt 231.0 lb

## 2022-01-15 DIAGNOSIS — Z85048 Personal history of other malignant neoplasm of rectum, rectosigmoid junction, and anus: Secondary | ICD-10-CM | POA: Insufficient documentation

## 2022-01-15 DIAGNOSIS — I509 Heart failure, unspecified: Secondary | ICD-10-CM | POA: Diagnosis not present

## 2022-01-15 DIAGNOSIS — C184 Malignant neoplasm of transverse colon: Secondary | ICD-10-CM

## 2022-01-15 DIAGNOSIS — I10 Essential (primary) hypertension: Secondary | ICD-10-CM | POA: Insufficient documentation

## 2022-01-15 DIAGNOSIS — Z85038 Personal history of other malignant neoplasm of large intestine: Secondary | ICD-10-CM | POA: Diagnosis not present

## 2022-01-15 DIAGNOSIS — N400 Enlarged prostate without lower urinary tract symptoms: Secondary | ICD-10-CM | POA: Diagnosis not present

## 2022-01-15 DIAGNOSIS — Z86010 Personal history of colonic polyps: Secondary | ICD-10-CM | POA: Diagnosis not present

## 2022-01-15 LAB — CEA (ACCESS): CEA (CHCC): 1.86 ng/mL (ref 0.00–5.00)

## 2022-01-15 NOTE — Progress Notes (Signed)
  [facility] OFFICE PROGRESS NOTE   Diagnosis: Colon cancer  INTERVAL HISTORY:   Mr. Golab returns as scheduled.  Feels well.  Good appetite and energy level.  No difficulty with bowel function.  No bleeding.  He continues to have an ulcer at the right foot amputation site.  He is followed by Dr. Sharol Given.  Objective:  Vital signs in last 24 hours:  Blood pressure 118/72, pulse 63, temperature 98.2 F (36.8 C), temperature source Oral, resp. rate 18, height $RemoveBe'5\' 11"'WrPNwKLNu$  (1.803 m), weight 231 lb (104.8 kg), SpO2 99 %.    Lymphatics: No cervical, supraclavicular, axillary, or inguinal nodes Resp: Lungs clear bilaterally Cardio: Regular rate and rhythm GI: No hepatosplenomegaly no mass, nontender Vascular: No leg edema   Lab Results:  Lab Results  Component Value Date   WBC 4.4 03/17/2021   HGB 13.9 03/17/2021   HCT 42.2 03/17/2021   MCV 90.8 03/17/2021   PLT PLATELET CLUMPS NOTED ON SMEAR, UNABLE TO ESTIMATE 03/17/2021   NEUTROABS 1.6 (L) 03/17/2021    CMP  Lab Results  Component Value Date   NA 140 12/02/2021   K 4.1 12/02/2021   CL 103 12/02/2021   CO2 22 12/02/2021   GLUCOSE 134 (H) 12/02/2021   BUN 20 12/02/2021   CREATININE 1.58 (H) 12/02/2021   CALCIUM 9.3 12/02/2021   PROT 7.5 03/16/2021   ALBUMIN 3.6 03/16/2021   AST 34 03/16/2021   ALT 20 03/16/2021   ALKPHOS 44 03/16/2021   BILITOT 0.8 03/16/2021   GFRNONAA 38 (L) 03/17/2021   GFRAA 49 (L) 08/02/2019    Lab Results  Component Value Date   CEA1 2.78 09/17/2020   CEA 1.86 01/15/2022    Medications: I have reviewed the patient's current medications.   Assessment/Plan: Colon cancer, transverse, stage II (T3N0), status post a right and transverse colectomy 12/15/2017 0/25 lymph nodes positive, no lymphovascular or perineural invasion MSI stable, no loss of mismatch repair protein expression CTs 12/10/2017- transverse colon wall thickening, 8 mm transverse mesocolon lymph node, no evidence  of metastatic disease, stable small bilateral pulmonary nodules Colonoscopy 12/09/2017-transverse colon mass, sessile polyps in the descending colon-not removed Colonoscopy 01/13/2019-no polyps, healthy appearing mucosa at the colocolonic anastomosis Polyps on the 12/15/2017 resection specimen- tubular adenomas Enlarged prostate with elevated PSA Status post TAVR 12/15/2016 Hypertension Pneumonia 1982 History of CHF Right first and second toe amputations secondary to osteomyelitis November 2020 in May 2021, right third metatarsal head ulcer/osteomyelitis followed by orthopedics    Disposition: Mr. Freid is in clinical remission from colon cancer.  The CEA is normal.  He would like to continue follow-up at the cancer center.  He will return for an office visit and CEA in 6 months. He received a message from Dr. Michail Sermon to schedule a surveillance colonoscopy.  I will contact Dr. Michail Sermon to review the indication for colonoscopy given Mr. Samson age and comorbid conditions.  Betsy Coder, MD  01/15/2022  3:26 PM

## 2022-02-05 ENCOUNTER — Telehealth: Payer: Self-pay | Admitting: Orthopedic Surgery

## 2022-02-05 NOTE — Telephone Encounter (Signed)
Called pt 1X and left vm. Pt sent a mychart needing to reschedule with Dr Sharol Given

## 2022-02-09 ENCOUNTER — Other Ambulatory Visit: Payer: Self-pay | Admitting: Physician Assistant

## 2022-02-09 ENCOUNTER — Other Ambulatory Visit: Payer: Self-pay | Admitting: Interventional Cardiology

## 2022-02-11 DIAGNOSIS — E1122 Type 2 diabetes mellitus with diabetic chronic kidney disease: Secondary | ICD-10-CM | POA: Diagnosis not present

## 2022-02-11 DIAGNOSIS — L97511 Non-pressure chronic ulcer of other part of right foot limited to breakdown of skin: Secondary | ICD-10-CM | POA: Diagnosis not present

## 2022-02-11 DIAGNOSIS — Z952 Presence of prosthetic heart valve: Secondary | ICD-10-CM | POA: Diagnosis not present

## 2022-02-11 DIAGNOSIS — E7800 Pure hypercholesterolemia, unspecified: Secondary | ICD-10-CM | POA: Diagnosis not present

## 2022-02-11 DIAGNOSIS — I739 Peripheral vascular disease, unspecified: Secondary | ICD-10-CM | POA: Diagnosis not present

## 2022-02-11 DIAGNOSIS — I5040 Unspecified combined systolic (congestive) and diastolic (congestive) heart failure: Secondary | ICD-10-CM | POA: Diagnosis not present

## 2022-02-11 DIAGNOSIS — I10 Essential (primary) hypertension: Secondary | ICD-10-CM | POA: Diagnosis not present

## 2022-02-11 DIAGNOSIS — N1831 Chronic kidney disease, stage 3a: Secondary | ICD-10-CM | POA: Diagnosis not present

## 2022-02-16 ENCOUNTER — Ambulatory Visit: Payer: Medicare HMO | Admitting: Orthopedic Surgery

## 2022-02-16 ENCOUNTER — Encounter: Payer: Self-pay | Admitting: Orthopedic Surgery

## 2022-02-16 DIAGNOSIS — L97511 Non-pressure chronic ulcer of other part of right foot limited to breakdown of skin: Secondary | ICD-10-CM

## 2022-02-16 DIAGNOSIS — Z89431 Acquired absence of right foot: Secondary | ICD-10-CM | POA: Diagnosis not present

## 2022-02-16 NOTE — Progress Notes (Signed)
Office Visit Note   Patient: Robert Horn           Date of Birth: February 28, 1934           MRN: [id number] Visit Date: 02/16/2022              Requested by: [name], MD [address] [place],  [place] [address] PCP: [name], MD  Chief Complaint  Patient presents with   Right Foot - Wound Check    Hx transmet amputation      HPI: Patient is a 88 year old gentleman who presents in follow-up for Wagner grade 1 ulcer right foot status post transmetatarsal amputation.  Patient states he has been more active lately with cases in court.  Assessment & Plan: Visit Diagnoses:  1. History of transmetatarsal amputation of right foot ([place])   2. Right foot ulcer, limited to breakdown of skin ([place])     Plan: Minimize weightbearing continue dry dressing changes will apply a felt relieving donut underneath his orthotic to further unload pressure.  Follow-Up Instructions: Return in about 4 weeks (around 03/16/2022).   Ortho Exam  Patient is alert, oriented, no adenopathy, well-dressed, normal affect, normal respiratory effort. Examination patient has hypertrophic callus with ulceration over the residual limb right transmetatarsal amputation.  There is no ascending cellulitis no fluctuance no drainage no odor.  After informed consent a 10 blade knife was used to debride the skin and soft tissue back to bleeding viable granulation tissue.  After debridement the ulcer is 3 cm in diameter 3 mm deep with healthy granulation tissue there is no tunneling no exposed bone or tendon.  Imaging: No results found. No images are attached to the encounter.  Labs: Lab Results  Component Value Date   HGBA1C 6.9 (H) 03/13/2021   HGBA1C 6.9 (H) 11/09/2020   HGBA1C 7.0 (H) 01/31/2019   REPTSTATUS 03/18/2021 FINAL 03/16/2021   CULT >=100,000 COLONIES/mL KLEBSIELLA PNEUMONIAE (A) 03/16/2021   LABORGA KLEBSIELLA PNEUMONIAE (A) 03/16/2021     Lab Results  Component Value Date    ALBUMIN 3.6 03/16/2021   ALBUMIN 3.7 03/12/2021   ALBUMIN 2.3 (L) 02/06/2019    Lab Results  Component Value Date   MG 2.1 02/06/2019   MG 2.3 02/05/2019   MG 2.3 02/04/2019   No results found for: "VD25OH"  No results found for: "PREALBUMIN"    Latest Ref Rng & Units 03/17/2021    6:36 PM 03/16/2021    4:27 PM 03/16/2021    4:23 PM  CBC EXTENDED  WBC 4.0 - 10.5 K/uL 4.4   6.0   RBC 4.22 - 5.81 MIL/uL 4.65   4.76   Hemoglobin 13.0 - 17.0 g/dL 13.9  15.0  13.6   HCT 39.0 - 52.0 % 42.2  44.0  43.7   Platelets 150 - 400 K/uL PLATELET CLUMPS NOTED ON SMEAR, UNABLE TO ESTIMATE   PLATELET CLUMPS NOTED ON SMEAR, UNABLE TO ESTIMATE   NEUT# 1.7 - 7.7 K/uL 1.6   3.3   Lymph# 0.7 - 4.0 K/uL 1.6   1.1      There is no height or weight on file to calculate BMI.  Orders:  No orders of the defined types were placed in this encounter.  No orders of the defined types were placed in this encounter.    Procedures: No procedures performed  Clinical Data: No additional findings.  ROS:  All other systems negative, except as noted in the HPI. Review of Systems  Objective: Vital Signs: There were no vitals taken for this visit.  Specialty Comments:  No specialty comments available.  PMFS History: Patient Active Problem List   Diagnosis Date Noted   COVID-19 virus infection 03/16/2021   TIA (transient ischemic attack) 03/12/2021   Diabetes mellitus type 2, controlled ([place]) 03/12/2021   Right foot ulcer, limited to breakdown of skin ([place]) 02/28/2020   Osteomyelitis of second toe of right foot ([facility])    Osteomyelitis of great toe of right foot ([facility])    Severe protein-calorie malnutrition ([place])    Diabetic polyneuropathy associated with type 2 diabetes mellitus ([place])    Cutaneous abscess of right foot    Cellulitis of right foot 01/30/2019   CKD (chronic kidney disease), stage III ([place] [facility]) 01/30/2019   Elevated LFTs 01/30/2019   Colon cancer ([facility]) 01/30/2019   Anemia of chronic  disease 01/30/2019   Acute on chronic combined systolic and diastolic CHF (congestive heart failure) ([place]) 03/09/2018   AKI (acute kidney injury) ([facility]) 03/09/2018   Colonic mass 12/10/2017   S/P TAVR (transcatheter aortic valve replacement) 03/03/2017   Severe aortic stenosis 12/15/2016   Essential hypertension 05/09/2013   Hyperlipidemia 05/09/2013   PVC's (premature ventricular contractions) 05/09/2013   Chronic combined systolic and diastolic heart failure ([place]) 05/09/2013   Past Medical History:  Diagnosis Date   Anemia    Anxiety    situational- surgery   Asthma    as a child   Cancer ([place]) 2019   colon- colectomy    CHF (congestive heart failure) ([facility])    Chronic kidney disease    followed by Dr. Delfina Redwood   Coronary artery disease    Diabetes mellitus without complication ([facility])    Type II   Dyslipidemia 10/27/2015   Dyspnea    w/ exertion    Elevated PSA 10/27/2015   Erectile dysfunction 10/27/2015   Heart murmur    Hypertension 10/27/2015   Hypogonadism male 10/27/2015   Neuropathy    Obesity 10/27/2015   Peripheral vascular disease ([place])    Pneumonia 10/27/2015   pt states was 1982   Rotator cuff tear 10/27/2015    Family History  Problem Relation Age of Onset   Diabetes Mother    Heart disease Mother    Pulmonary embolism Father     Past Surgical History:  Procedure Laterality Date   ABDOMINAL AORTOGRAM N/A 02/01/2019   Procedure: ABDOMINAL AORTOGRAM;  Surgeon: Marty Heck, MD;  Location: [facility];  Service: Cardiovascular;  Laterality: N/A;   AMPUTATION Right 02/03/2019   Procedure: RIGHT FOOT 1ST RAY AMPUTATION;  Surgeon: [name], MD;  Location: [facility];  Service: Orthopedics;  Laterality: Right;   AMPUTATION Right 08/02/2019   Procedure: RIGHT 2ND TOE AMPUTATION;  Surgeon: [name], MD;  Location: [facility];  Service: Orthopedics;  Laterality: Right;   AMPUTATION Right 11/08/2020   Procedure: RIGHT TRANSMETATARSAL AMPUTATION;   Surgeon: [name], MD;  Location: [facility];  Service: Orthopedics;  Laterality: Right;   APPLICATION OF WOUND VAC  11/08/2020   Procedure: APPLICATION OF WOUND VAC;  Surgeon: [name], MD;  Location: [facility];  Service: Orthopedics;;   CARDIAC CATHETERIZATION N/A 11/14/2015   Procedure: Left Heart Cath and Coronary Angiography;  Surgeon: [name], MD;  Location: [facility];  Service: Cardiovascular;  Laterality: N/A;   COLONOSCOPY     LAPAROSCOPIC PARTIAL COLECTOMY  12/15/2017   LAPAROSCOPIC PARTIAL COLECTOMY (N/A Abdomen)   LAPAROSCOPIC  PARTIAL COLECTOMY N/A 12/15/2017   Procedure: LAPAROSCOPIC PARTIAL COLECTOMY;  Surgeon: Erroll Luna, MD;  Location: [facility];  Service: General;  Laterality: N/A;   LOWER EXTREMITY ANGIOGRAPHY Right 02/01/2019   Procedure: LOWER EXTREMITY ANGIOGRAPHY;  Surgeon: Marty Heck, MD;  Location: [facility];  Service: Cardiovascular;  Laterality: Right;   PERIPHERAL VASCULAR INTERVENTION Right 02/01/2019   Procedure: PERIPHERAL VASCULAR INTERVENTION;  Surgeon: Marty Heck, MD;  Location: [facility];  Service: Cardiovascular;  Laterality: Right;  SFA   TEE WITHOUT CARDIOVERSION N/A 12/15/2016   Procedure: TRANSESOPHAGEAL ECHOCARDIOGRAM (TEE);  Surgeon: Sherren Mocha, MD;  Location: [facility];  Service: Open Heart Surgery;  Laterality: N/A;   TRANSCATHETER AORTIC VALVE REPLACEMENT, TRANSFEMORAL N/A 12/15/2016   Procedure: TRANSCATHETER AORTIC VALVE REPLACEMENT, TRANSFEMORAL;  Surgeon: Sherren Mocha, MD;  Location: [facility];  Service: Open Heart Surgery;  Laterality: N/A;   Social History   Occupational History   Not on file  Tobacco Use   Smoking status: Former    Types: Cigarettes   Smokeless tobacco: Never  Vaping Use   Vaping Use: Never used  Substance and Sexual Activity   Alcohol use: Yes    Comment: rarely   Drug use: No   Sexual activity: Not on file

## 2022-03-03 DIAGNOSIS — N1832 Chronic kidney disease, stage 3b: Secondary | ICD-10-CM | POA: Diagnosis not present

## 2022-03-03 DIAGNOSIS — R109 Unspecified abdominal pain: Secondary | ICD-10-CM | POA: Diagnosis not present

## 2022-03-03 DIAGNOSIS — E1122 Type 2 diabetes mellitus with diabetic chronic kidney disease: Secondary | ICD-10-CM | POA: Diagnosis not present

## 2022-03-05 ENCOUNTER — Other Ambulatory Visit: Payer: Self-pay | Admitting: Interventional Cardiology

## 2022-03-08 ENCOUNTER — Other Ambulatory Visit: Payer: Self-pay | Admitting: Interventional Cardiology

## 2022-03-09 ENCOUNTER — Other Ambulatory Visit: Payer: Self-pay | Admitting: Interventional Cardiology

## 2022-03-10 NOTE — Progress Notes (Unsigned)
Cardiology Office Note:    Date:  03/11/2022   ID:  [name], DOB Oct 23, 1933, MRN [id number]  PCP:  [name], MD  Cardiologist:  Sinclair Grooms, MD   Referring MD: [name], MD   Chief Complaint  Patient presents with   Atrial Fibrillation   Congestive Heart Failure   Cardiac Valve Problem   Hypertension   Coronary Artery Disease    History of Present Illness:    Robert Horn is a 88 y.o. male with a hx of chronic combined S/D CHF, HTN, HLD, colon cancer, s/p TAVR (12/15/16), chronic combined systolic and diastolic HF (EF to 85-92% with mean AV gradient of 12 mmmHg),  S/P multiple amputations right foot, and recent Covid 19 (02/2021).   Both Ron and his wife are present.  His wife is interested in who I will refer him to.  Rayann Heman he is doing well.  He is not having shortness of breath.  He is compliant with his medical regimen.  EF has nearly normalized on good heart failure therapy.  Recent laboratory data did reveal creatinine of 1.61 which is actually an improvement over some previous values, hemoglobin A1c 6.7, potassium 4.3.  He denies orthopnea.  Still has a nonhealing wound on the right foot stump.  For over 1 year he has been having recurrent debridement by Dr. Sharol Given.  Has a session coming up.  Past Medical History:  Diagnosis Date   Anemia    Anxiety    situational- surgery   Asthma    as a child   Cancer ([place]) 2019   colon- colectomy    CHF (congestive heart failure) ([place])    Chronic kidney disease    followed by Dr. Delfina Redwood   Coronary artery disease    Diabetes mellitus without complication ([place])    Type II   Dyslipidemia 10/27/2015   Dyspnea    w/ exertion    Elevated PSA 10/27/2015   Erectile dysfunction 10/27/2015   Heart murmur    Hypertension 10/27/2015   Hypogonadism male 10/27/2015   Neuropathy    Obesity 10/27/2015   Peripheral vascular disease ([place])    Pneumonia 10/27/2015   pt states was 1982   Rotator cuff tear  10/27/2015    Past Surgical History:  Procedure Laterality Date   ABDOMINAL AORTOGRAM N/A 02/01/2019   Procedure: ABDOMINAL AORTOGRAM;  Surgeon: Marty Heck, MD;  Location: [facility];  Service: Cardiovascular;  Laterality: N/A;   AMPUTATION Right 02/03/2019   Procedure: RIGHT FOOT 1ST RAY AMPUTATION;  Surgeon: [name], MD;  Location: [facility];  Service: Orthopedics;  Laterality: Right;   AMPUTATION Right 08/02/2019   Procedure: RIGHT 2ND TOE AMPUTATION;  Surgeon: [name], MD;  Location: [facility];  Service: Orthopedics;  Laterality: Right;   AMPUTATION Right 11/08/2020   Procedure: RIGHT TRANSMETATARSAL AMPUTATION;  Surgeon: [name], MD;  Location: [facility];  Service: Orthopedics;  Laterality: Right;   APPLICATION OF WOUND VAC  11/08/2020   Procedure: APPLICATION OF WOUND VAC;  Surgeon: [name], MD;  Location: [facility];  Service: Orthopedics;;   CARDIAC CATHETERIZATION N/A 11/14/2015   Procedure: Left Heart Cath and Coronary Angiography;  Surgeon: [name], MD;  Location: [facility];  Service: Cardiovascular;  Laterality: N/A;   COLONOSCOPY     LAPAROSCOPIC PARTIAL COLECTOMY  12/15/2017   LAPAROSCOPIC PARTIAL COLECTOMY (N/A Abdomen)   LAPAROSCOPIC PARTIAL COLECTOMY N/A 12/15/2017   Procedure: LAPAROSCOPIC  PARTIAL COLECTOMY;  Surgeon: Erroll Luna, MD;  Location: [facility];  Service: General;  Laterality: N/A;   LOWER EXTREMITY ANGIOGRAPHY Right 02/01/2019   Procedure: LOWER EXTREMITY ANGIOGRAPHY;  Surgeon: Marty Heck, MD;  Location: [facility];  Service: Cardiovascular;  Laterality: Right;   PERIPHERAL VASCULAR INTERVENTION Right 02/01/2019   Procedure: PERIPHERAL VASCULAR INTERVENTION;  Surgeon: Marty Heck, MD;  Location: [facility];  Service: Cardiovascular;  Laterality: Right;  SFA   TEE WITHOUT CARDIOVERSION N/A 12/15/2016   Procedure: TRANSESOPHAGEAL ECHOCARDIOGRAM (TEE);  Surgeon: Sherren Mocha, MD;  Location: [facility];  Service: Open Heart Surgery;  Laterality: N/A;   TRANSCATHETER AORTIC VALVE REPLACEMENT, TRANSFEMORAL N/A 12/15/2016   Procedure: TRANSCATHETER AORTIC VALVE REPLACEMENT, TRANSFEMORAL;  Surgeon: Sherren Mocha, MD;  Location: [facility];  Service: Open Heart Surgery;  Laterality: N/A;    Current Medications: Current Meds  Medication Sig   aspirin 81 MG EC tablet Take 81 mg by mouth daily. Swallow whole.   Cholecalciferol (VITAMIN D) 50 MCG (2000 UT) tablet Take 2,000 Units by mouth daily.   diclofenac Sodium (VOLTAREN) 1 % GEL Apply 4 g topically as needed.   ferrous sulfate 325 (65 FE) MG tablet Take 325 mg by mouth daily with breakfast.   JARDIANCE 25 MG TABS tablet Take 25 mg by mouth daily.   Multiple Vitamin (MULTIVITAMIN WITH MINERALS) TABS tablet Take 1 tablet by mouth daily.   omega-3 acid ethyl esters (LOVAZA) 1 g capsule Take 1 capsule (1 g total) by mouth daily.   rosuvastatin (CRESTOR) 40 MG tablet Take 1 tablet (40 mg total) by mouth daily.   tiZANidine (ZANAFLEX) 2 MG tablet 2 mg 3 (three) times daily as needed for muscle spasms.   VITAMIN B COMPLEX-C PO Take 1 tablet by mouth daily.   [DISCONTINUED] amLODipine (NORVASC) 10 MG tablet TAKE 1 TABLET BY MOUTH EVERY DAY   [DISCONTINUED] carvedilol (COREG) 3.125 MG tablet Take 1 tablet (3.125 mg total) by mouth 2 (two) times daily with a meal.   [DISCONTINUED] clopidogrel (PLAVIX) 75 MG tablet Take 1 tablet (75 mg total) by mouth daily.   [DISCONTINUED] furosemide (LASIX) 40 MG tablet TAKE 1.5 TABLETS BY MOUTH DAILY.   [DISCONTINUED] sacubitril-valsartan (ENTRESTO) 49-51 MG Take 1 tablet by mouth 2 (two) times daily. Please attend scheduled appointment for additional refills.     Allergies:   Patient has no known allergies.   Social History   Socioeconomic History   Marital status: Married    Spouse name: Not on file   Number of children: Not on file   Years of education: Not on file   Highest education level: Not on file   Occupational History   Not on file  Tobacco Use   Smoking status: Former    Types: Cigarettes   Smokeless tobacco: Never  Vaping Use   Vaping Use: Never used  Substance and Sexual Activity   Alcohol use: Yes    Comment: rarely   Drug use: No   Sexual activity: Not on file  Other Topics Concern   Not on file  Social History Narrative   Not on file   Social Determinants of Health   Financial Resource Strain: Not on file  Food Insecurity: Not on file  Transportation Needs: Not on file  Physical Activity: Not on file  Stress: Not on file  Social Connections: Not on file     Family History: The patient's family history includes Diabetes in his mother; Heart  disease in his mother; Pulmonary embolism in his father.  ROS:   Please see the history of present illness.    Has an open wound in the stump of his right foot and has had repeated debridement.  All other systems reviewed and are negative.  EKGs/Labs/Other Studies Reviewed:    The following studies were reviewed today:  2D Doppler echocardiogram 03/13/2021: IMPRESSIONS   1. Left ventricular ejection fraction, by estimation, is 45 to 50%. The  left ventricle has mildly decreased function. The left ventricle  demonstrates global hypokinesis. The left ventricular internal cavity size  was mildly dilated. Left ventricular  diastolic parameters are consistent with Grade I diastolic dysfunction  (impaired relaxation).   2. Right ventricular systolic function is normal. The right ventricular  size is normal. There is normal pulmonary artery systolic pressure.   3. Left atrial size was severely dilated.   4. The mitral valve is normal in structure. Trivial mitral valve  regurgitation. No evidence of mitral stenosis.   5. Aortic valve gradients are moderately elevated, unchanged from  01/2019. Deceleration time 81 msec, consistent with a normally funcitoning  prosthetic valve. The aortic valve has been repaired/replaced.  Aortic  valve regurgitation is not visualized. No  aortic stenosis is present. Aortic valve area, by VTI measures 0.52 cm.  Aortic valve mean gradient measures 24.5 mmHg. Aortic valve Vmax measures  3.26 m/s.   6. The inferior vena cava is normal in size with greater than 50%  respiratory variability, suggesting right atrial pressure of 3 mmHg.   EKG:  EKG normal sinus rhythm, nonspecific T wave flattening, inferior Q waves 2 3 and aVF, incomplete right bundle, first-degree AV block 242 ms, PVC.  Compared to prior tracings, from 03/19/2021, no significant change  Recent Labs: 03/16/2021: ALT 20 03/17/2021: B Natriuretic Peptide 102.2; Hemoglobin 13.9; Platelets PLATELET CLUMPS NOTED ON SMEAR, UNABLE TO ESTIMATE 12/02/2021: BUN 20; Creatinine, Ser 1.58; Potassium 4.1; Sodium 140  Recent Lipid Panel    Component Value Date/Time   CHOL 185 05/13/2021 0959   TRIG 113 05/13/2021 0959   HDL 58 05/13/2021 0959   CHOLHDL 3.2 05/13/2021 0959   CHOLHDL 2.9 03/17/2021 0354   VLDL 15 03/17/2021 0354   LDLCALC 107 (H) 05/13/2021 0959    Physical Exam:    VS:  BP 112/76   Pulse (!) 59   Ht '5\' 11"'$  (1.803 m)   Wt 233 lb 3.2 oz (105.8 kg)   SpO2 95%   BMI 32.52 kg/m     Wt Readings from Last 3 Encounters:  03/11/22 233 lb 3.2 oz (105.8 kg)  01/15/22 231 lb (104.8 kg)  07/25/21 233 lb 3.2 oz (105.8 kg)     GEN: Appears younger than the stated age. No acute distress HEENT: Normal NECK: No JVD. LYMPHATICS: No lymphadenopathy CARDIAC: 2/6 systolic aortic valve murmur.  No diastolic murmurs heard.. RRR S4 gallop but no S3 gallop, or edema. VASCULAR:  Normal Pulses. No bruits. RESPIRATORY:  Clear to auscultation without rales, wheezing or rhonchi  ABDOMEN: Soft, non-tender, non-distended, No pulsatile mass, MUSCULOSKELETAL: Transmetatarsal amputation of right foot. SKIN: Warm and dry NEUROLOGIC:  Alert and oriented x 3 PSYCHIATRIC:  Normal affect   ASSESSMENT:    1. Chronic combined  systolic and diastolic heart failure ([place])   2. S/P TAVR (transcatheter aortic valve replacement)   3. Essential hypertension   4. CKD (chronic kidney disease) stage 2, GFR 60-89 ml/min   5. PAD (peripheral artery disease) ([place])  6. Mixed hyperlipidemia   7. TIA (transient ischemic attack)   8. S/P transmetatarsal amputation of foot, right (HCC)    PLAN:    In order of problems listed above:  Continue triple drug therapy for systolic dysfunction.  We were unable to use MRA therapy because of difficulty with kidney impairment and potassium.  Continue current dose of Entresto, Jardiance, and carvedilol. Should probably have repeat echo at some point in 2024. Blood pressure is well-controlled.  He is on a significant amount of amlodipine.  If blood pressure remains reasonable, may be wise to decrease intensity and or DC if it seems appropriate. Creatinine most recently was 1.61.  CKD 3B.  Hopefully Vania Rea is helping with renal preservation as is valsartan.  At next office visit will need to have at least a basic metabolic panel. Still sees Dr. Sharol Given.  Has nonhealing ulcer in the stump of his right transmetatarsal amputation.  I had asked him to inquire about the wound care clinic or hyperbaric therapy.  Of course this may not be helpful for the patient's current condition and I would be dependent upon the advice of Dr. Sharol Given. Continue statin therapy. Continue preventive therapy   Guideline directed therapy for left ventricular systolic dysfunction: Angiotensin receptor-neprilysin inhibitor (ARNI)-Entresto; beta-blocker therapy - carvedilol, metoprolol succinate, or bisoprolol; mineralocorticoid receptor antagonist (MRA) therapy -spironolactone or eplerenone.  SGLT-2 agents -  Dapagliflozin [place]) or Empagliflozin (Jardiance).These therapies have been shown to improve clinical outcomes including reduction of rehospitalization, survival, and acute heart failure.  6 month appointment with Dr.  Irish Lack  Medication Adjustments/Labs and Tests Ordered: Current medicines are reviewed at length with the patient today.  Concerns regarding medicines are outlined above.  Orders Placed This Encounter  Procedures   EKG 12-Lead   Meds ordered this encounter  Medications   DISCONTD: carvedilol (COREG) 3.125 MG tablet    Sig: Take 1 tablet (3.125 mg total) by mouth 2 (two) times daily with a meal.    Dispense:  60 tablet    Refill:  11   DISCONTD: sacubitril-valsartan (ENTRESTO) 49-51 MG    Sig: Take 1 tablet by mouth 2 (two) times daily. Please attend scheduled appointment for additional refills.    Dispense:  60 tablet    Refill:  11    Please attend scheduled appointment for additional refills.   amLODipine (NORVASC) 10 MG tablet    Sig: Take 1 tablet (10 mg total) by mouth daily.    Dispense:  90 tablet    Refill:  3   carvedilol (COREG) 3.125 MG tablet    Sig: Take 1 tablet (3.125 mg total) by mouth 2 (two) times daily with a meal.    Dispense:  60 tablet    Refill:  11   clopidogrel (PLAVIX) 75 MG tablet    Sig: Take 1 tablet (75 mg total) by mouth daily.    Dispense:  30 tablet    Refill:  11   furosemide (LASIX) 40 MG tablet    Sig: TAKE 1.5 TABLETS BY MOUTH DAILY.    Dispense:  45 tablet    Refill:  11   sacubitril-valsartan (ENTRESTO) 49-51 MG    Sig: Take 1 tablet by mouth 2 (two) times daily. Please attend scheduled appointment for additional refills.    Dispense:  60 tablet    Refill:  11    Please attend scheduled appointment for additional refills.    Patient Instructions  Medication Instructions:  Your physician recommends that you continue on  your current medications as directed. Please refer to the Current Medication list given to you today.  *If you need a refill on your cardiac medications before your next appointment, please call your pharmacy*  Follow-Up: At [facility], you and your health needs are our priority.  As part of our  continuing mission to provide you with exceptional heart care, we have created designated Provider Care Teams.  These Care Teams include your primary Cardiologist (physician) and Advanced Practice Providers (APPs -  Physician Assistants and Nurse Practitioners) who all work together to provide you with the care you need, when you need it.  Your next appointment:   6 month(s)  The format for your next appointment:   In Person  Provider:   Larae Grooms, MD  Important Information About Sugar         Signed, Sinclair Grooms, MD  03/11/2022 5:21 PM    [place]

## 2022-03-11 ENCOUNTER — Encounter: Payer: Self-pay | Admitting: Interventional Cardiology

## 2022-03-11 ENCOUNTER — Other Ambulatory Visit: Payer: Self-pay | Admitting: Interventional Cardiology

## 2022-03-11 ENCOUNTER — Ambulatory Visit: Payer: Medicare HMO | Attending: Interventional Cardiology | Admitting: Interventional Cardiology

## 2022-03-11 VITALS — BP 112/76 | HR 59 | Ht 71.0 in | Wt 233.2 lb

## 2022-03-11 DIAGNOSIS — G459 Transient cerebral ischemic attack, unspecified: Secondary | ICD-10-CM | POA: Diagnosis not present

## 2022-03-11 DIAGNOSIS — E782 Mixed hyperlipidemia: Secondary | ICD-10-CM

## 2022-03-11 DIAGNOSIS — Z89431 Acquired absence of right foot: Secondary | ICD-10-CM | POA: Diagnosis not present

## 2022-03-11 DIAGNOSIS — N182 Chronic kidney disease, stage 2 (mild): Secondary | ICD-10-CM

## 2022-03-11 DIAGNOSIS — I10 Essential (primary) hypertension: Secondary | ICD-10-CM | POA: Diagnosis not present

## 2022-03-11 DIAGNOSIS — I739 Peripheral vascular disease, unspecified: Secondary | ICD-10-CM | POA: Diagnosis not present

## 2022-03-11 DIAGNOSIS — I5042 Chronic combined systolic (congestive) and diastolic (congestive) heart failure: Secondary | ICD-10-CM

## 2022-03-11 DIAGNOSIS — Z952 Presence of prosthetic heart valve: Secondary | ICD-10-CM | POA: Diagnosis not present

## 2022-03-11 MED ORDER — CARVEDILOL 3.125 MG PO TABS: 3.1250 mg | ORAL_TABLET | Freq: Two times a day (BID) | ORAL | 11 refills | Status: DC

## 2022-03-11 MED ORDER — FUROSEMIDE 40 MG PO TABS: ORAL_TABLET | ORAL | 11 refills | Status: DC

## 2022-03-11 MED ORDER — ENTRESTO 49-51 MG PO TABS: 1.0000 | ORAL_TABLET | Freq: Two times a day (BID) | ORAL | 11 refills | Status: DC

## 2022-03-11 MED ORDER — CLOPIDOGREL BISULFATE 75 MG PO TABS: 75.0000 mg | ORAL_TABLET | Freq: Every day | ORAL | 11 refills | Status: DC

## 2022-03-11 MED ORDER — AMLODIPINE BESYLATE 10 MG PO TABS: 10.0000 mg | ORAL_TABLET | Freq: Every day | ORAL | 3 refills | Status: DC

## 2022-03-11 NOTE — Patient Instructions (Signed)
Medication Instructions:  Your physician recommends that you continue on your current medications as directed. Please refer to the Current Medication list given to you today.  *If you need a refill on your cardiac medications before your next appointment, please call your pharmacy*  Follow-Up: At [facility], you and your health needs are our priority.  As part of our continuing mission to provide you with exceptional heart care, we have created designated Provider Care Teams.  These Care Teams include your primary Cardiologist (physician) and Advanced Practice Providers (APPs -  Physician Assistants and Nurse Practitioners) who all work together to provide you with the care you need, when you need it.  Your next appointment:   6 month(s)  The format for your next appointment:   In Person  Provider:   Larae Grooms, MD  Important Information About Sugar

## 2022-03-17 ENCOUNTER — Ambulatory Visit (INDEPENDENT_AMBULATORY_CARE_PROVIDER_SITE_OTHER): Payer: Medicare HMO | Admitting: Family

## 2022-03-17 ENCOUNTER — Ambulatory Visit: Payer: Medicare HMO | Admitting: Orthopedic Surgery

## 2022-03-17 ENCOUNTER — Encounter: Payer: Self-pay | Admitting: Family

## 2022-03-17 DIAGNOSIS — Z89431 Acquired absence of right foot: Secondary | ICD-10-CM

## 2022-03-17 DIAGNOSIS — L97511 Non-pressure chronic ulcer of other part of right foot limited to breakdown of skin: Secondary | ICD-10-CM | POA: Diagnosis not present

## 2022-03-17 MED ORDER — "HYDROFERA BLUE 4""X4"" EX PADS": 1.0000 | MEDICATED_PAD | Freq: Every day | CUTANEOUS | 0 refills | Status: DC

## 2022-03-17 NOTE — Progress Notes (Signed)
Office Visit Note   Patient: Robert Horn           Date of Birth: [birth year]           MRN: [id number] Visit Date: 03/17/2022              Requested by: [name], MD [address] [place],  [place] [address] PCP: [name], MD  Chief Complaint  Patient presents with   Right Foot - Wound Check    Hx transmet amputation      HPI: The patient is an 88 year old gentleman who presents in follow-up for Wagner grade 1 ulcer right foot to his transmetatarsal amputation.  He states he has been trying to offload the right foot more over the last month.  He also has new felt pressure relieving down in his shoe wear to his spacer  Assessment & Plan: Visit Diagnoses: No diagnosis found.  Plan: wound packed open with silver cell. His wife is interested in a wound center referral. Interested in trying hydrofera blue.  Discussed options.   Placed in [place] today. Pack open with silvercell. Consider hydrofera blue.   Follow-Up Instructions: No follow-ups on file.   Ortho Exam  Patient is alert, oriented, no adenopathy, well-dressed, normal affect, normal respiratory effort. On examination of the right foot he has hypertrophic callus with a central ulceration to his transmetatarsal amputation on the right.  There is no surrounding erythema no active drainage no sign of infection.  After informed consent a 10 blade knife was used to debride the skin and soft tissue back to bleeding viable granulation tissue.  The ulcer is now measuring 2 cm in diameter there is 3 mm of depth this does not probe to bone or tendon.  There is 50% necrotic tissue in the wound bed  Imaging: No results found. No images are attached to the encounter.  Labs: Lab Results  Component Value Date   HGBA1C 6.9 (H) 03/13/2021   HGBA1C 6.9 (H) 11/09/2020   HGBA1C 7.0 (H) 01/31/2019   REPTSTATUS 03/18/2021 FINAL 03/16/2021   CULT >=100,000 COLONIES/mL KLEBSIELLA PNEUMONIAE (A) 03/16/2021    LABORGA KLEBSIELLA PNEUMONIAE (A) 03/16/2021     Lab Results  Component Value Date   ALBUMIN 3.6 03/16/2021   ALBUMIN 3.7 03/12/2021   ALBUMIN 2.3 (L) 02/06/2019    Lab Results  Component Value Date   MG 2.1 02/06/2019   MG 2.3 02/05/2019   MG 2.3 02/04/2019   No results found for: "VD25OH"  No results found for: "PREALBUMIN"    Latest Ref Rng & Units 03/17/2021    6:36 PM 03/16/2021    4:27 PM 03/16/2021    4:23 PM  CBC EXTENDED  WBC 4.0 - 10.5 K/uL 4.4   6.0   RBC 4.22 - 5.81 MIL/uL 4.65   4.76   Hemoglobin 13.0 - 17.0 g/dL 13.9  15.0  13.6   HCT 39.0 - 52.0 % 42.2  44.0  43.7   Platelets 150 - 400 K/uL PLATELET CLUMPS NOTED ON SMEAR, UNABLE TO ESTIMATE   PLATELET CLUMPS NOTED ON SMEAR, UNABLE TO ESTIMATE   NEUT# 1.7 - 7.7 K/uL 1.6   3.3   Lymph# 0.7 - 4.0 K/uL 1.6   1.1      There is no height or weight on file to calculate BMI.  Orders:  No orders of the defined types were placed in this encounter.  No orders of the defined types were placed in this  encounter.    Procedures: No procedures performed  Clinical Data: No additional findings.  ROS:  All other systems negative, except as noted in the HPI. Review of Systems  Objective: Vital Signs: There were no vitals taken for this visit.  Specialty Comments:  No specialty comments available.  PMFS History: Patient Active Problem List   Diagnosis Date Noted   COVID-19 virus infection 03/16/2021   TIA (transient ischemic attack) 03/12/2021   Diabetes mellitus type 2, controlled ([place]) 03/12/2021   Right foot ulcer, limited to breakdown of skin ([place]) 02/28/2020   Osteomyelitis of second toe of right foot (HCC)    Osteomyelitis of great toe of right foot (HCC)    Severe protein-calorie malnutrition ([place])    Diabetic polyneuropathy associated with type 2 diabetes mellitus ([place])    Cutaneous abscess of right foot    Cellulitis of right foot 01/30/2019   CKD (chronic kidney disease), stage III  ([place]) 01/30/2019   Elevated LFTs 01/30/2019   Colon cancer ([place]) 01/30/2019   Anemia of chronic disease 01/30/2019   Acute on chronic combined systolic and diastolic CHF (congestive heart failure) ([place]) 03/09/2018   AKI (acute kidney injury) ([place]) 03/09/2018   Colonic mass 12/10/2017   S/P TAVR (transcatheter aortic valve replacement) 03/03/2017   Severe aortic stenosis 12/15/2016   Essential hypertension 05/09/2013   Hyperlipidemia 05/09/2013   PVC's (premature ventricular contractions) 05/09/2013   Chronic combined systolic and diastolic heart failure ([place]) 05/09/2013   Past Medical History:  Diagnosis Date   Anemia    Anxiety    situational- surgery   Asthma    as a child   Cancer ([place]) 2019   colon- colectomy    CHF (congestive heart failure) ([place])    Chronic kidney disease    followed by Dr. Delfina Redwood   Coronary artery disease    Diabetes mellitus without complication ([place])    Type II   Dyslipidemia 10/27/2015   Dyspnea    w/ exertion    Elevated PSA 10/27/2015   Erectile dysfunction 10/27/2015   Heart murmur    Hypertension 10/27/2015   Hypogonadism male 10/27/2015   Neuropathy    Obesity 10/27/2015   Peripheral vascular disease ([place])    Pneumonia 10/27/2015   pt states was 1982   Rotator cuff tear 10/27/2015    Family History  Problem Relation Age of Onset   Diabetes Mother    Heart disease Mother    Pulmonary embolism Father     Past Surgical History:  Procedure Laterality Date   ABDOMINAL AORTOGRAM N/A 02/01/2019   Procedure: ABDOMINAL AORTOGRAM;  Surgeon: Marty Heck, MD;  Location: [facility];  Service: Cardiovascular;  Laterality: N/A;   AMPUTATION Right 02/03/2019   Procedure: RIGHT FOOT 1ST RAY AMPUTATION;  Surgeon: [name], MD;  Location: [facility];  Service: Orthopedics;  Laterality: Right;   AMPUTATION Right 08/02/2019   Procedure: RIGHT 2ND TOE AMPUTATION;  Surgeon: [name], MD;  Location: [facility];  Service: Orthopedics;   Laterality: Right;   AMPUTATION Right 11/08/2020   Procedure: RIGHT TRANSMETATARSAL AMPUTATION;  Surgeon: [name], MD;  Location: [facility];  Service: Orthopedics;  Laterality: Right;   APPLICATION OF WOUND VAC  11/08/2020   Procedure: APPLICATION OF WOUND VAC;  Surgeon: [name], MD;  Location: [place];  Service: Orthopedics;;   CARDIAC CATHETERIZATION N/A 11/14/2015   Procedure: Left Heart Cath and Coronary Angiography;  Surgeon: [name], MD;  [facility];  Service: Cardiovascular;  Laterality: N/A;   COLONOSCOPY     LAPAROSCOPIC PARTIAL COLECTOMY  12/15/2017   LAPAROSCOPIC PARTIAL COLECTOMY (N/A Abdomen)   LAPAROSCOPIC PARTIAL COLECTOMY N/A 12/15/2017   Procedure: LAPAROSCOPIC PARTIAL COLECTOMY;  Surgeon: Erroll Luna, MD;  Location: [place];  Service: General;  Laterality: N/A;   LOWER EXTREMITY ANGIOGRAPHY Right 02/01/2019   Procedure: LOWER EXTREMITY ANGIOGRAPHY;  Surgeon: Marty Heck, MD;  Location: [facility] [place] [facility];  Service: Cardiovascular;  Laterality: Right;   PERIPHERAL VASCULAR INTERVENTION Right 02/01/2019   Procedure: PERIPHERAL VASCULAR INTERVENTION;  Surgeon: Marty Heck, MD;  Location: [place];  Service: Cardiovascular;  Laterality: Right;  SFA   TEE WITHOUT CARDIOVERSION N/A 12/15/2016   Procedure: TRANSESOPHAGEAL ECHOCARDIOGRAM (TEE);  Surgeon: Sherren Mocha, MD;  Location: [place];  Service: Open Heart Surgery;  Laterality: N/A;   TRANSCATHETER AORTIC VALVE REPLACEMENT, TRANSFEMORAL N/A 12/15/2016   Procedure: TRANSCATHETER AORTIC VALVE REPLACEMENT, TRANSFEMORAL;  Surgeon: Sherren Mocha, MD;  Location: [facility];  Service: Open Heart Surgery;  Laterality: N/A;   Social History   Occupational History   Not on file  Tobacco Use   Smoking status: Former    Types: Cigarettes   Smokeless tobacco: Never  Vaping Use   Vaping Use: Never used  Substance and Sexual Activity   Alcohol use: Yes    Comment: rarely   Drug  use: No   Sexual activity: Not on file

## 2022-03-19 ENCOUNTER — Other Ambulatory Visit: Payer: Self-pay | Admitting: *Deleted

## 2022-03-19 DIAGNOSIS — I739 Peripheral vascular disease, unspecified: Secondary | ICD-10-CM

## 2022-04-02 ENCOUNTER — Telehealth: Payer: Self-pay

## 2022-04-02 NOTE — Patient Outreach (Signed)
  Care Coordination   Initial Visit Note   04/02/2022 Name: Carrick Rijos MRN: [id number] DOB: 10-Jun-1933  Franki Stemen is a 89 y.o. year old male who sees [name], MD for primary care. I  spoke with DPR wife Lauren Aguayo  What matters to the patients health and wellness today?  No concerns today.  States his foot is healing slowly.  States he is doing well and she makes sure he eats healthy and takes his medications    Goals Addressed             This Visit's Progress    COMPLETED: Care Coordination Activities - no follow up required       Care Coordination Interventions: Provided education to patient re: care coordination services, reviewed to eat good sources of protein with CHO Assessed social determinant of health barriers Last Annual Wellness Visit 08/18/21          SDOH assessments and interventions completed:  Yes  SDOH Interventions Today    Flowsheet Row Most Recent Value  SDOH Interventions   Food Insecurity Interventions Intervention Not Indicated  Housing Interventions Intervention Not Indicated  Transportation Interventions Intervention Not Indicated  Utilities Interventions Intervention Not Indicated        Care Coordination Interventions:  Yes, provided   Follow up plan: No further intervention required.   Encounter Outcome:  Pt. Visit Completed  Peter Garter RN, BSN,CCM, CDE Care Management Coordinator [facility] [phone]

## 2022-04-02 NOTE — Patient Instructions (Signed)
Visit Information  Thank you for taking time to visit with me today. Please don't hesitate to contact me if I can be of assistance to you.   Following are the goals we discussed today:   Goals Addressed             This Visit's Progress    COMPLETED: Care Coordination Activities - no follow up required       Care Coordination Interventions: Provided education to patient re: care coordination services, reviewed to eat good sources of protein with CHO Assessed social determinant of health barriers Last Annual Wellness Visit 08/18/21           If you are experiencing a Mental Health or [place] or need someone to talk to, please call the Suicide and Crisis Lifeline: 988 call the [place] National Suicide Prevention Lifeline: 337-034-7298 or TTY: 517 697 8229 TTY 641-854-7808) to talk to a trained counselor call 1-800-273-TALK (toll free, 24 hour hotline) go to [facility] [phone]) call 911   Patient verbalizes understanding of instructions and care plan provided today and agrees to view in [place]. Active MyChart status and patient understanding of how to access instructions and care plan via MyChart confirmed with patient.     No further follow up required:    Peter Garter RN, Jackquline [place], [facility] [phone]

## 2022-04-06 ENCOUNTER — Ambulatory Visit: Payer: Medicare HMO | Admitting: Physician Assistant

## 2022-04-06 ENCOUNTER — Ambulatory Visit (INDEPENDENT_AMBULATORY_CARE_PROVIDER_SITE_OTHER)
Admission: RE | Admit: 2022-04-06 | Discharge: 2022-04-06 | Disposition: A | Discharge status: Home / Self Care | Payer: Medicare HMO | Source: Ambulatory Visit | Attending: Vascular Surgery | Admitting: Vascular Surgery

## 2022-04-06 ENCOUNTER — Ambulatory Visit (HOSPITAL_COMMUNITY)
Admission: RE | Admit: 2022-04-06 | Discharge: 2022-04-06 | Disposition: A | Discharge status: Home / Self Care | Payer: Medicare HMO | Source: Ambulatory Visit | Attending: Vascular Surgery | Admitting: Vascular Surgery

## 2022-04-06 VITALS — BP 144/89 | HR 65 | Temp 98.2°F | Resp 20 | Ht 71.0 in | Wt 233.4 lb

## 2022-04-06 DIAGNOSIS — I739 Peripheral vascular disease, unspecified: Secondary | ICD-10-CM

## 2022-04-06 LAB — VAS US ABI WITH/WO TBI
Left ABI: 0.78
Right ABI: 1.11

## 2022-04-06 NOTE — Progress Notes (Signed)
Established PAD   History of Present Illness   Robert Horn is a 89 y.o. (08/18/1933) male who presents for surveillance of PAD. He has a history of right SFA angioplasty and stent placement by [facility]Clark on 02/01/2019.  This was done to improve blood flow so he could heal a right first ray amputation on 02/03/2019 by Robert Horn.  He then required a right second toe amputation on 08/02/2019 by Robert Horn.  Due to poor healing, he ultimately required a right transmetatarsal amputation by Robert Horn on 11/08/2020.  Postoperatively, the patient developed a hypertrophic callus with ulceration over his TMA.  This ulceration has required serial debridements by Robert Horn every month.  He continues to wear a Hydrofera bandage over his ulcer to protect it.   At his most recent appointment with orthopedics, his ulceration was debrided again back to healthy tissue.  He will be trying Hydrofera blue bandages. He may also be referred to a wound care center.  He denies any pain in his right foot, claudication, or other wounds.  He has been taking his aspirin and statin.  Current Outpatient Medications  Medication Sig Dispense Refill   amLODipine (NORVASC) 10 MG tablet Take 1 tablet (10 mg total) by mouth daily. 90 tablet 3   aspirin 81 MG EC tablet Take 81 mg by mouth daily. Swallow whole.     carvedilol (COREG) 3.125 MG tablet Take 1 tablet (3.125 mg total) by mouth 2 (two) times daily with a meal. 60 tablet 11   Cholecalciferol (VITAMIN D) 50 MCG (2000 UT) tablet Take 2,000 Units by mouth daily.     clopidogrel (PLAVIX) 75 MG tablet Take 1 tablet (75 mg total) by mouth daily. 30 tablet 11   diclofenac Sodium (VOLTAREN) 1 % GEL Apply 4 g topically as needed.     ferrous sulfate 325 (65 FE) MG tablet Take 325 mg by mouth daily with breakfast.     furosemide (LASIX) 40 MG tablet TAKE 1.5 TABLETS BY MOUTH DAILY. 45 tablet 11   JARDIANCE 25 MG TABS tablet Take 25 mg by mouth daily.     Multiple Vitamin (MULTIVITAMIN WITH  MINERALS) TABS tablet Take 1 tablet by mouth daily. 30 tablet 1   omega-3 acid ethyl esters (LOVAZA) 1 g capsule Take 1 capsule (1 g total) by mouth daily. 90 capsule 2   rosuvastatin (CRESTOR) 40 MG tablet Take 1 tablet (40 mg total) by mouth daily. 30 tablet 5   sacubitril-valsartan (ENTRESTO) 49-51 MG Take 1 tablet by mouth 2 (two) times daily. Please attend scheduled appointment for additional refills. 60 tablet 11   tiZANidine (ZANAFLEX) 2 MG tablet 2 mg 3 (three) times daily as needed for muscle spasms.     VITAMIN B COMPLEX-C PO Take 1 tablet by mouth daily.     Wound Dressings (HYDROFERA BLUE 4"X4") PADS Apply 1 Application topically daily. 2 each 0   No current facility-administered medications for this visit.    REVIEW OF SYSTEMS (negative unless checked):   Cardiac:  '[]'$  Chest pain or chest pressure? '[]'$  Shortness of breath upon activity? '[]'$  Shortness of breath when lying flat? '[]'$  Irregular heart rhythm?  Vascular:  '[]'$  Pain in calf, thigh, or hip brought on by walking? '[]'$  Pain in feet at night that wakes you up from your sleep? '[]'$  Blood clot in your veins? '[]'$  Leg swelling?  Pulmonary:  '[]'$  Oxygen at home? '[]'$  Productive cough? '[]'$  Wheezing?  Neurologic:  '[]'$  Sudden weakness in arms  or legs? '[]'$  Sudden numbness in arms or legs? '[]'$  Sudden onset of difficult speaking or slurred speech? '[]'$  Temporary loss of vision in one eye? '[]'$  Problems with dizziness?  Gastrointestinal:  '[]'$  Blood in stool? '[]'$  Vomited blood?  Genitourinary:  '[]'$  Burning when urinating? '[]'$  Blood in urine?  Psychiatric:  '[]'$  Major depression  Hematologic:  '[]'$  Bleeding problems? '[]'$  Problems with blood clotting?  Dermatologic:  '[]'$  Rashes or ulcers?  Constitutional:  '[]'$  Fever or chills?  Ear/Nose/Throat:  '[]'$  Change in hearing? '[]'$  Nose bleeds? '[]'$  Sore throat?  Musculoskeletal:  '[]'$  Back pain? '[]'$  Joint pain? '[]'$  Muscle pain?   Physical Examination   Vitals:   04/06/22 1304  BP: (!)  144/89  Pulse: 65  Resp: 20  Temp: 98.2 F (36.8 C)  TempSrc: Temporal  SpO2: 97%  Weight: 233 lb 6.4 oz (105.9 kg)  Height: '5\' 11"'$  (1.803 m)   Body mass index is 32.55 kg/m.  General:  WDWN in NAD; vital signs documented above Gait: Not observed HENT: WNL, normocephalic Pulmonary: normal non-labored breathing Cardiac: regular HR, without carotid bruit Abdomen: soft, NT, no masses Skin: without rashes Vascular Exam/Pulses: Triphasic right PT and biphasic right DP Extremities: without ischemic changes, without gangrene , without cellulitis; with open ulceration of right TMA Musculoskeletal: no muscle wasting or atrophy  Neurologic: A&O X 3;  No focal weakness or paresthesias are detected Psychiatric:  The pt has Normal affect.  Non-Invasive Vascular Imaging ABI (04/06/2022) +--------+------------------+-----+---------+--------+  Right  Rt Pressure (mmHg)IndexWaveform Comment   +--------+------------------+-----+---------+--------+  [id number]                                      +--------+------------------+-----+---------+--------+  PTA    156               1.11 triphasic          +--------+------------------+-----+---------+--------+  DP     139               0.99 biphasic           +--------+------------------+-----+---------+--------+   +---------+------------------+-----+----------+-------+  Left    Lt Pressure (mmHg)IndexWaveform  Comment  +---------+------------------+-----+----------+-------+  Brachial 141                                       +---------+------------------+-----+----------+-------+  PTA     110               0.78 monophasic         +---------+------------------+-----+----------+-------+  DP      77                0.55 monophasic         +---------+------------------+-----+----------+-------+  Great Toe77                0.55                     +---------+------------------+-----+----------+-------+   +-------+-----------+-----------+------------+------------+  ABI/TBIToday's ABIToday's TBIPrevious ABIPrevious TBI  +-------+-----------+-----------+------------+------------+  Right 1.11       amp        1.08        amp           +-------+-----------+-----------+------------+------------+  Left  0.78       0.55       0.71  0.6           +-------+-----------+-----------+------------+------------+   RLE Arterial Duplex (04/06/2022) RIGHT      PSV cm/sRatioStenosisWaveform  Comments  +-----------+--------+-----+--------+----------+--------+  CFA Distal 139                  biphasic            +-----------+--------+-----+--------+----------+--------+  DFA       72                   biphasic            +-----------+--------+-----+--------+----------+--------+  SFA Prox   130                  triphasic           +-----------+--------+-----+--------+----------+--------+  SFA Mid                                   stent     +-----------+--------+-----+--------+----------+--------+  SFA Distal 63                   biphasic            +-----------+--------+-----+--------+----------+--------+  POP Prox   53                   triphasic           +-----------+--------+-----+--------+----------+--------+  POP Distal 61                   triphasic           +-----------+--------+-----+--------+----------+--------+  ATA Distal 24                   monophasic          +-----------+--------+-----+--------+----------+--------+  PTA Distal 102                  biphasic            +-----------+--------+-----+--------+----------+--------+  PERO Distal62                   triphasic           +-----------+--------+-----+--------+----------+--------+     Right Stent(s):  +-----------------+--------+--------+---------+--------+  mid to distal  SFAPSV cm/sStenosisWaveform Comments  +-----------------+--------+--------+---------+--------+  Prox to Stent    81              triphasic          +-----------------+--------+--------+---------+--------+  Proximal Stent   95              triphasic          +-----------------+--------+--------+---------+--------+  Mid Stent        85              triphasic          +-----------------+--------+--------+---------+--------+  Distal Stent     77              triphasic          +-----------------+--------+--------+---------+--------+  Distal to Stent  63              biphasic           +-----------------+--------+--------+---------+--------+    Medical Decision Making   Robert Horn is a 89 y.o. male who presents for surveillance of PAD  Based on the patient's vascular studies, his ABIs are essentially unchanged at 1.11  today from 1.08 six months ago.  There is triphasic flow in the right posterior tibial artery and biphasic flow in the right dorsalis pedis artery.  His ABIs on the left are slightly increased from 0.71 to 0.78. He denies any claudication, rest pain, new nonhealing wounds Duplex study demonstrates a patent right SFA stent with no signs of stenosis He still has a healing ulceration on his right TMA that is being managed by Robert Horn.  He has serial visits with Robert Horn for debridement and wound assessment every month.  The patient wears a Hydrofera dressing over his wound the rest of the time. Per wife's request, he may be referred to wound care center.  Overall he is making progress with the healing of his wound He will continue to take his aspirin, Plavix, and statin.  He will follow-up with our office in 6 months with repeat ABIs and right lower extremity arterial duplex study   Robert Serene PA-C [facility]: [facility] MD: Trula Slade

## 2022-04-07 ENCOUNTER — Other Ambulatory Visit: Payer: Self-pay

## 2022-04-07 DIAGNOSIS — I739 Peripheral vascular disease, unspecified: Secondary | ICD-10-CM

## 2022-04-16 ENCOUNTER — Ambulatory Visit: Payer: Medicare HMO | Admitting: Orthopedic Surgery

## 2022-04-16 ENCOUNTER — Encounter: Payer: Self-pay | Admitting: Orthopedic Surgery

## 2022-04-16 DIAGNOSIS — Z89431 Acquired absence of right foot: Secondary | ICD-10-CM | POA: Diagnosis not present

## 2022-04-16 DIAGNOSIS — L97511 Non-pressure chronic ulcer of other part of right foot limited to breakdown of skin: Secondary | ICD-10-CM

## 2022-04-16 NOTE — Progress Notes (Signed)
Office Visit Note   Patient: Robert Horn           Date of Birth: 06-28-1933           MRN: [id number] Visit Date: 04/16/2022              Requested by: [name], MD [address] [place],  [place] [address] PCP: [name], MD  Chief Complaint  Patient presents with   Right Foot - Follow-up      HPI: Patient is an 89 year old gentleman presents in follow-up for [facility] grade 1 ulcer plantar aspect right foot status post transmetatarsal amputation.  He is wearing a compression sock he denies any odor or drainage.  Assessment & Plan: Visit Diagnoses:  1. History of transmetatarsal amputation of right foot ([place])   2. Right foot ulcer, limited to breakdown of skin ([place])     Plan: Patient has good custom orthotics there is a pressure relieving felt donut underneath the orthotic.  Patient will continue with protected weightbearing.  Follow-Up Instructions: Return in about 4 weeks (around 05/14/2022).   Ortho Exam  Patient is alert, oriented, no adenopathy, well-dressed, normal affect, normal respiratory effort. Examination there is no redness cellulitis odor or drainage he does have a Wagner grade 1 ulcer.  After informed consent a 10 blade knife was used to debride the skin and soft tissue back to healthy viable granulation tissue.  The ulcer was 1 cm diameter and 3 mm deep after debridement.  Imaging: No results found. No images are attached to the encounter.  Labs: Lab Results  Component Value Date   HGBA1C 6.9 (H) 03/13/2021   HGBA1C 6.9 (H) 11/09/2020   HGBA1C 7.0 (H) 01/31/2019   REPTSTATUS 03/18/2021 FINAL 03/16/2021   CULT >=100,000 COLONIES/mL KLEBSIELLA PNEUMONIAE (A) 03/16/2021   LABORGA KLEBSIELLA PNEUMONIAE (A) 03/16/2021     Lab Results  Component Value Date   ALBUMIN 3.6 03/16/2021   ALBUMIN 3.7 03/12/2021   ALBUMIN 2.3 (L) 02/06/2019    Lab Results  Component Value Date   MG 2.1 02/06/2019   MG 2.3 02/05/2019   MG 2.3  02/04/2019   No results found for: "VD25OH"  No results found for: "PREALBUMIN"    Latest Ref Rng & Units 03/17/2021    6:36 PM 03/16/2021    4:27 PM 03/16/2021    4:23 PM  CBC EXTENDED  WBC 4.0 - 10.5 K/uL 4.4   6.0   RBC 4.22 - 5.81 MIL/uL 4.65   4.76   Hemoglobin 13.0 - 17.0 g/dL 13.9  15.0  13.6   HCT 39.0 - 52.0 % 42.2  44.0  43.7   Platelets 150 - 400 K/uL PLATELET CLUMPS NOTED ON SMEAR, UNABLE TO ESTIMATE   PLATELET CLUMPS NOTED ON SMEAR, UNABLE TO ESTIMATE   NEUT# 1.7 - 7.7 K/uL 1.6   3.3   Lymph# 0.7 - 4.0 K/uL 1.6   1.1      There is no height or weight on file to calculate BMI.  Orders:  No orders of the defined types were placed in this encounter.  No orders of the defined types were placed in this encounter.    Procedures: No procedures performed  Clinical Data: No additional findings.  ROS:  All other systems negative, except as noted in the HPI. Review of Systems  Objective: Vital Signs: There were no vitals taken for this visit.  Specialty Comments:  No specialty comments available.  PMFS History: Patient Active Problem  List   Diagnosis Date Noted   COVID-19 virus infection 03/16/2021   TIA (transient ischemic attack) 03/12/2021   Diabetes mellitus type 2, controlled ([place]) 03/12/2021   Right foot ulcer, limited to breakdown of skin ([place]) 02/28/2020   Osteomyelitis of second toe of right foot ([place])    Osteomyelitis of great toe of right foot ([facility])    Severe protein-calorie malnutrition ([place])    Diabetic polyneuropathy associated with type 2 diabetes mellitus ([place])    Cutaneous abscess of right foot    Cellulitis of right foot 01/30/2019   CKD (chronic kidney disease), stage III ([place]) 01/30/2019   Elevated LFTs 01/30/2019   Colon cancer ([place]) 01/30/2019   Anemia of chronic disease 01/30/2019   Acute on chronic combined systolic and diastolic CHF (congestive heart failure) ([place]) 03/09/2018   AKI (acute kidney injury) ([place]) 03/09/2018    Colonic mass 12/10/2017   S/P TAVR (transcatheter aortic valve replacement) 03/03/2017   Severe aortic stenosis 12/15/2016   Essential hypertension 05/09/2013   Hyperlipidemia 05/09/2013   PVC's (premature ventricular contractions) 05/09/2013   Chronic combined systolic and diastolic heart failure ([place]) 05/09/2013   Past Medical History:  Diagnosis Date   Anemia    Anxiety    situational- surgery   Asthma    as a child   Cancer ([place]) 2019   colon- colectomy    CHF (congestive heart failure) ([place])    Chronic kidney disease    followed by Dr. Delfina Redwood   Coronary artery disease    Diabetes mellitus without complication ([place])    Type II   Dyslipidemia 10/27/2015   Dyspnea    w/ exertion    Elevated PSA 10/27/2015   Erectile dysfunction 10/27/2015   Heart murmur    Hypertension 10/27/2015   Hypogonadism male 10/27/2015   Neuropathy    Obesity 10/27/2015   Peripheral vascular disease ([place])    Pneumonia 10/27/2015   pt states was 1982   Rotator cuff tear 10/27/2015    Family History  Problem Relation Age of Onset   Diabetes Mother    Heart disease Mother    Pulmonary embolism Father     Past Surgical History:  Procedure Laterality Date   ABDOMINAL AORTOGRAM N/A 02/01/2019   Procedure: ABDOMINAL AORTOGRAM;  Surgeon: Marty Heck, MD;  Location: [facility];  Service: Cardiovascular;  Laterality: N/A;   AMPUTATION Right 02/03/2019   Procedure: RIGHT FOOT 1ST RAY AMPUTATION;  Surgeon: [name], MD;  Location: [facility];  Service: Orthopedics;  Laterality: Right;   AMPUTATION Right 08/02/2019   Procedure: RIGHT 2ND TOE AMPUTATION;  Surgeon: [name], MD;  Location: [facility];  Service: Orthopedics;  Laterality: Right;   AMPUTATION Right 11/08/2020   Procedure: RIGHT TRANSMETATARSAL AMPUTATION;  Surgeon: [name], MD;  Location: [facility];  Service: Orthopedics;  Laterality: Right;   APPLICATION OF WOUND VAC  11/08/2020   Procedure: APPLICATION OF WOUND VAC;   Surgeon: [name], MD;  Location: [facility];  Service: Orthopedics;;   CARDIAC CATHETERIZATION N/A 11/14/2015   Procedure: Left Heart Cath and Coronary Angiography;  Surgeon: [name], MD;  Location: [facility];  Service: Cardiovascular;  Laterality: N/A;   COLONOSCOPY     LAPAROSCOPIC PARTIAL COLECTOMY  12/15/2017   LAPAROSCOPIC PARTIAL COLECTOMY (N/A Abdomen)   LAPAROSCOPIC PARTIAL COLECTOMY N/A 12/15/2017   Procedure: LAPAROSCOPIC PARTIAL COLECTOMY;  Surgeon: Erroll Luna, MD;  Location: [facility];  Service: General;  Laterality: N/A;  LOWER EXTREMITY ANGIOGRAPHY Right 02/01/2019   Procedure: LOWER EXTREMITY ANGIOGRAPHY;  Surgeon: Marty Heck, MD;  Location: [facility];  Service: Cardiovascular;  Laterality: Right;   PERIPHERAL VASCULAR INTERVENTION Right 02/01/2019   Procedure: PERIPHERAL VASCULAR INTERVENTION;  Surgeon: Marty Heck, MD;  Location: [facility];  Service: Cardiovascular;  Laterality: Right;  SFA   TEE WITHOUT CARDIOVERSION N/A 12/15/2016   Procedure: TRANSESOPHAGEAL ECHOCARDIOGRAM (TEE);  Surgeon: Sherren Mocha, MD;  Location: [facility];  Service: Open Heart Surgery;  Laterality: N/A;   TRANSCATHETER AORTIC VALVE REPLACEMENT, TRANSFEMORAL N/A 12/15/2016   Procedure: TRANSCATHETER AORTIC VALVE REPLACEMENT, TRANSFEMORAL;  Surgeon: Sherren Mocha, MD;  Location: [facility];  Service: Open Heart Surgery;  Laterality: N/A;   Social History   Occupational History   Not on file  Tobacco Use   Smoking status: Former    Types: Cigarettes    Passive exposure: Never   Smokeless tobacco: Never  Vaping Use   Vaping Use: Never used  Substance and Sexual Activity   Alcohol use: Yes    Comment: rarely   Drug use: No   Sexual activity: Not on file

## 2022-05-05 ENCOUNTER — Other Ambulatory Visit: Payer: Self-pay

## 2022-05-05 MED ORDER — OMEGA-3-ACID ETHYL ESTERS 1 G PO CAPS: 1.0000 g | ORAL_CAPSULE | Freq: Every day | ORAL | 3 refills | Status: DC

## 2022-05-14 ENCOUNTER — Ambulatory Visit: Payer: Medicare HMO | Admitting: Orthopedic Surgery

## 2022-05-14 ENCOUNTER — Telehealth: Payer: Self-pay

## 2022-05-14 NOTE — Telephone Encounter (Signed)
**Note De-Identified [name] Obfuscation** Per letter received from [facility] Zabian Swayne fax, they have denied coverage of Robert Horn as follows: Robert Horn Member Number: [id number] Your request was denied We have denied coverage or payment under your Medicare Part D benefit for the following prescription drug(s) that you or your prescriber requested: Robert Horn Capsule Why did we deny your request? We denied this request under Medicare Part D because: The information provided by your prescriber did not meet the requirements for covering this medication (prior authorization).   Your plan does not allow coverage of this medication based on your prescriber answering No to the following  question(s): Does the patient have a diagnosis of severe hypertriglyceridemia?  Per the pts past lipid results in his chart, his highest Triglyceride level was 113 on 05/13/2021 and all other results showed his triglycerides were less than 100.  Will forward this note to Dr Irish Lack and his nurse for advisement to the pt.

## 2022-05-26 ENCOUNTER — Ambulatory Visit: Payer: Medicare HMO | Admitting: Orthopedic Surgery

## 2022-05-26 DIAGNOSIS — Z89431 Acquired absence of right foot: Secondary | ICD-10-CM | POA: Diagnosis not present

## 2022-05-26 MED ORDER — OXYCODONE-ACETAMINOPHEN 5-325 MG PO TABS: 1.0000 | ORAL_TABLET | ORAL | 0 refills | Status: DC | PRN

## 2022-06-09 ENCOUNTER — Encounter: Payer: Self-pay | Admitting: Orthopedic Surgery

## 2022-06-09 NOTE — Progress Notes (Signed)
Office Visit Note   Patient: Robert Horn           Date of Birth: 1933-12-25           MRN: [id number] Visit Date: 05/26/2022              Requested by: [name], MD [address] [place],  [place] [address] PCP: [name], MD  Chief Complaint  Patient presents with   Right Foot - Wound Check    Hx right transmet amputation      HPI: Patient is an 89 year old gentleman who is status post transmetatarsal amputation on the right.  Patient has custom orthotics and pressure relieving donut.  He still has a persistent Wagner grade 1 ulcer.  Assessment & Plan: Visit Diagnoses:  1. History of transmetatarsal amputation of right foot ([place])     Plan: Ulcer was debrided there was no tunneling or exposed bone prescription for pain medicine is provided.  At follow-up evaluate for Kerecis shield.  Follow-Up Instructions: Return in about 4 weeks (around 06/23/2022).   Ortho Exam  Patient is alert, oriented, no adenopathy, well-dressed, normal affect, normal respiratory effort. Examination patient has no redness no cellulitis no swelling.  He has a residual Wagner grade 1 ulcer right transmetatarsal amputation.  After informed consent a 10 blade knife was used to debride the skin and soft tissue back to healthy viable bleeding granulation tissue this was touched with silver nitrate.  There is no tunneling no exposed bone or tendon.  After debridement the wound was 2 cm in diameter and 3 mm deep.  Imaging: No results found. No images are attached to the encounter.  Labs: Lab Results  Component Value Date   HGBA1C 6.9 (H) 03/13/2021   HGBA1C 6.9 (H) 11/09/2020   HGBA1C 7.0 (H) 01/31/2019   REPTSTATUS 03/18/2021 FINAL 03/16/2021   CULT >=100,000 COLONIES/mL KLEBSIELLA PNEUMONIAE (A) 03/16/2021   LABORGA KLEBSIELLA PNEUMONIAE (A) 03/16/2021     Lab Results  Component Value Date   ALBUMIN 3.6 03/16/2021   ALBUMIN 3.7 03/12/2021   ALBUMIN 2.3 (L)  02/06/2019    Lab Results  Component Value Date   MG 2.1 02/06/2019   MG 2.3 02/05/2019   MG 2.3 02/04/2019   No results found for: "VD25OH"  No results found for: "PREALBUMIN"    Latest Ref Rng & Units 03/17/2021    6:36 PM 03/16/2021    4:27 PM 03/16/2021    4:23 PM  CBC EXTENDED  WBC 4.0 - 10.5 K/uL 4.4   6.0   RBC 4.22 - 5.81 MIL/uL 4.65   4.76   Hemoglobin 13.0 - 17.0 g/dL 13.9  15.0  13.6   HCT 39.0 - 52.0 % 42.2  44.0  43.7   Platelets 150 - 400 K/uL PLATELET CLUMPS NOTED ON SMEAR, UNABLE TO ESTIMATE   PLATELET CLUMPS NOTED ON SMEAR, UNABLE TO ESTIMATE   NEUT# 1.7 - 7.7 K/uL 1.6   3.3   Lymph# 0.7 - 4.0 K/uL 1.6   1.1      There is no height or weight on file to calculate BMI.  Orders:  No orders of the defined types were placed in this encounter.  Meds ordered this encounter  Medications   oxyCODONE-acetaminophen (PERCOCET/ROXICET) 5-325 MG tablet    Sig: Take 1 tablet by mouth every 4 (four) hours as needed for severe pain.    Dispense:  30 tablet    Refill:  0  Procedures: No procedures performed  Clinical Data: No additional findings.  ROS:  All other systems negative, except as noted in the HPI. Review of Systems  Objective: Vital Signs: There were no vitals taken for this visit.  Specialty Comments:  No specialty comments available.  PMFS History: Patient Active Problem List   Diagnosis Date Noted   COVID-19 virus infection 03/16/2021   TIA (transient ischemic attack) 03/12/2021   Diabetes mellitus type 2, controlled ([place]) 03/12/2021   Right foot ulcer, limited to breakdown of skin ([place]) 02/28/2020   Osteomyelitis of second toe of right foot (HCC)    Osteomyelitis of great toe of right foot (HCC)    Severe protein-calorie malnutrition ([place])    Diabetic polyneuropathy associated with type 2 diabetes mellitus ([place])    Cutaneous abscess of right foot    Cellulitis of right foot 01/30/2019   CKD (chronic kidney disease), stage III  ([place]) 01/30/2019   Elevated LFTs 01/30/2019   Colon cancer ([place]) 01/30/2019   Anemia of chronic disease 01/30/2019   Acute on chronic combined systolic and diastolic CHF (congestive heart failure) ([place]) 03/09/2018   AKI (acute kidney injury) ([place]) 03/09/2018   Colonic mass 12/10/2017   S/P TAVR (transcatheter aortic valve replacement) 03/03/2017   Severe aortic stenosis 12/15/2016   Essential hypertension 05/09/2013   Hyperlipidemia 05/09/2013   PVC's (premature ventricular contractions) 05/09/2013   Chronic combined systolic and diastolic heart failure ([place]) 05/09/2013   Past Medical History:  Diagnosis Date   Anemia    Anxiety    situational- surgery   Asthma    as a child   Cancer ([place]) 2019   colon- colectomy    CHF (congestive heart failure) ([place])    Chronic kidney disease    followed by Dr. Delfina Redwood   Coronary artery disease    Diabetes mellitus without complication ([place])    Type II   Dyslipidemia 10/27/2015   Dyspnea    w/ exertion    Elevated PSA 10/27/2015   Erectile dysfunction 10/27/2015   Heart murmur    Hypertension 10/27/2015   Hypogonadism male 10/27/2015   Neuropathy    Obesity 10/27/2015   Peripheral vascular disease ([place])    Pneumonia 10/27/2015   pt states was 1982   Rotator cuff tear 10/27/2015    Family History  Problem Relation Age of Onset   Diabetes Mother    Heart disease Mother    Pulmonary embolism Father     Past Surgical History:  Procedure Laterality Date   ABDOMINAL AORTOGRAM N/A 02/01/2019   Procedure: ABDOMINAL AORTOGRAM;  Surgeon: Marty Heck, MD;  Location: [facility];  Service: Cardiovascular;  Laterality: N/A;   AMPUTATION Right 02/03/2019   Procedure: RIGHT FOOT 1ST RAY AMPUTATION;  Surgeon: [name], MD;  Location: [facility];  Service: Orthopedics;  Laterality: Right;   AMPUTATION Right 08/02/2019   Procedure: RIGHT 2ND TOE AMPUTATION;  Surgeon: [name], MD;  Location: [facility];  Service: Orthopedics;   Laterality: Right;   AMPUTATION Right 11/08/2020   Procedure: RIGHT TRANSMETATARSAL AMPUTATION;  Surgeon: [name], MD;  Location: [facility];  Service: Orthopedics;  Laterality: Right;   APPLICATION OF WOUND VAC  11/08/2020   Procedure: APPLICATION OF WOUND VAC;  Surgeon: [name], MD;  Location: [facility];  Service: Orthopedics;;   CARDIAC CATHETERIZATION N/A 11/14/2015   Procedure: Left Heart Cath and Coronary Angiography;  Surgeon: [name], MD;  Location: [facility];  Service: Cardiovascular;  Laterality: N/A;   COLONOSCOPY     LAPAROSCOPIC PARTIAL COLECTOMY  12/15/2017   LAPAROSCOPIC PARTIAL COLECTOMY (N/A Abdomen)   LAPAROSCOPIC PARTIAL COLECTOMY N/A 12/15/2017   Procedure: LAPAROSCOPIC PARTIAL COLECTOMY;  Surgeon: Erroll Luna, MD;  Location: [facility];  Service: General;  Laterality: N/A;   LOWER EXTREMITY ANGIOGRAPHY Right 02/01/2019   Procedure: LOWER EXTREMITY ANGIOGRAPHY;  Surgeon: Marty Heck, MD;  Location: [facility];  Service: Cardiovascular;  Laterality: Right;   PERIPHERAL VASCULAR INTERVENTION Right 02/01/2019   Procedure: PERIPHERAL VASCULAR INTERVENTION;  Surgeon: Marty Heck, MD;  Location: [facility];  Service: Cardiovascular;  Laterality: Right;  SFA   TEE WITHOUT CARDIOVERSION N/A 12/15/2016   Procedure: TRANSESOPHAGEAL ECHOCARDIOGRAM (TEE);  Surgeon: Sherren Mocha, MD;  Location: [facility];  Service: Open Heart Surgery;  Laterality: N/A;   TRANSCATHETER AORTIC VALVE REPLACEMENT, TRANSFEMORAL N/A 12/15/2016   Procedure: TRANSCATHETER AORTIC VALVE REPLACEMENT, TRANSFEMORAL;  Surgeon: Sherren Mocha, MD;  Location: [facility];  Service: Open Heart Surgery;  Laterality: N/A;   Social History   Occupational History   Not on file  Tobacco Use   Smoking status: Former    Types: Cigarettes    Passive exposure: Never   Smokeless tobacco: Never  Vaping Use   Vaping Use: Never used  Substance and Sexual Activity   Alcohol use: Yes     Comment: rarely   Drug use: No   Sexual activity: Not on file

## 2022-06-10 NOTE — Telephone Encounter (Signed)
He had only been taking Lovaza 1g once daily. His TG have ranged 77-113 in the past 4 years. He is ok to stop Lovaza and does not need additional medication. He should continue taking his rosuvastatin daily.

## 2022-06-11 MED ORDER — OMEGA-3-ACID ETHYL ESTERS 1 G PO CAPS: 1.0000 g | ORAL_CAPSULE | Freq: Every day | ORAL | 3 refills | Status: DC

## 2022-06-11 NOTE — Telephone Encounter (Signed)
**Note De-Identified Robert Horn Obfuscation** I called the pts wife and DPR, Alice back and she advised me that they pay out of pocket for the pts Omega-3-acid ethyl esters because it is not expensive and that they are aware that his insurance will no longer cover it.  She thanked me for calling her to discuss.  I have noted on the pts Omega-3-acid ethyl esters RX note to the pharmacy: "The patient is aware that his insurance does not cover Omega-3-acid ethyl esters and he states that he will pay out of pocket for it. Thank you".

## 2022-06-11 NOTE — Telephone Encounter (Signed)
**Note De-Identified Marybel Alcott Obfuscation** I called the pt and advised him that he longer needs to take his Omega-3-acid ethyl esters but to continue to take his Rosuvastatin.  He did not know which medication I was referring to and requested that I call back at 11:30 to s/w his wife as she takes care of all of his medications.  I advised him that I will call back at around 11:30 this morning to discuss with his wife. He thanked me for my call.

## 2022-06-22 ENCOUNTER — Encounter: Payer: Self-pay | Admitting: Orthopedic Surgery

## 2022-06-22 ENCOUNTER — Ambulatory Visit: Payer: Medicare HMO | Admitting: Orthopedic Surgery

## 2022-06-22 DIAGNOSIS — L97511 Non-pressure chronic ulcer of other part of right foot limited to breakdown of skin: Secondary | ICD-10-CM

## 2022-06-22 DIAGNOSIS — Z89431 Acquired absence of right foot: Secondary | ICD-10-CM

## 2022-06-22 NOTE — Progress Notes (Signed)
Office Visit Note   Patient: Robert Horn           Date of Birth: 1933/11/16           MRN: [id number] Visit Date: 06/22/2022              Requested by: [name], MD [address] [place],  [place] [address] PCP: [name], MD  Chief Complaint  Patient presents with   Right Foot - Wound Check    Hx right transmet amputation      HPI: Patient is an 89 year old gentleman who is seen in follow-up for right transmetatarsal amputation with a Wagner grade 1 ulcer.  Assessment & Plan: Visit Diagnoses:  1. History of transmetatarsal amputation of right foot ([place])   2. Right foot ulcer, limited to breakdown of skin ([place])     Plan: Ulcer was debrided donated Kerecis tissue micro graft was applied  Follow-Up Instructions: Return in about 4 weeks (around 07/20/2022).   Ortho Exam  Patient is alert, oriented, no adenopathy, well-dressed, normal affect, normal respiratory effort. Examination patient has a Wagner grade 1 ulcer right transmetatarsal amputation.  After informed consent a 10 blade knife was used to debride the skin and soft tissue back to healthy viable granulation tissue.  This was touched with silver nitrate.  After debridement the ulcer was 1 cm in diameter 3 mm deep.  Donated Kerecis micro graft was applied  Imaging: No results found. No images are attached to the encounter.  Labs: Lab Results  Component Value Date   HGBA1C 6.9 (H) 03/13/2021   HGBA1C 6.9 (H) 11/09/2020   HGBA1C 7.0 (H) 01/31/2019   REPTSTATUS 03/18/2021 FINAL 03/16/2021   CULT >=100,000 COLONIES/mL KLEBSIELLA PNEUMONIAE (A) 03/16/2021   LABORGA KLEBSIELLA PNEUMONIAE (A) 03/16/2021     Lab Results  Component Value Date   ALBUMIN 3.6 03/16/2021   ALBUMIN 3.7 03/12/2021   ALBUMIN 2.3 (L) 02/06/2019    Lab Results  Component Value Date   MG 2.1 02/06/2019   MG 2.3 02/05/2019   MG 2.3 02/04/2019   No results found for: "VD25OH"  No results found for:  "PREALBUMIN"    Latest Ref Rng & Units 03/17/2021    6:36 PM 03/16/2021    4:27 PM 03/16/2021    4:23 PM  CBC EXTENDED  WBC 4.0 - 10.5 K/uL 4.4   6.0   RBC 4.22 - 5.81 MIL/uL 4.65   4.76   Hemoglobin 13.0 - 17.0 g/dL 13.9  15.0  13.6   HCT 39.0 - 52.0 % 42.2  44.0  43.7   Platelets 150 - 400 K/uL PLATELET CLUMPS NOTED ON SMEAR, UNABLE TO ESTIMATE   PLATELET CLUMPS NOTED ON SMEAR, UNABLE TO ESTIMATE   NEUT# 1.7 - 7.7 K/uL 1.6   3.3   Lymph# 0.7 - 4.0 K/uL 1.6   1.1      There is no height or weight on file to calculate BMI.  Orders:  No orders of the defined types were placed in this encounter.  No orders of the defined types were placed in this encounter.    Procedures: No procedures performed  Clinical Data: No additional findings.  ROS:  All other systems negative, except as noted in the HPI. Review of Systems  Objective: Vital Signs: There were no vitals taken for this visit.  Specialty Comments:  No specialty comments available.  PMFS History: Patient Active Problem List   Diagnosis Date Noted   COVID-19 virus  infection 03/16/2021   TIA (transient ischemic attack) 03/12/2021   Diabetes mellitus type 2, controlled ([place]) 03/12/2021   Right foot ulcer, limited to breakdown of skin ([place]) 02/28/2020   Osteomyelitis of second toe of right foot (H[facility])    Osteomyelitis of great toe of right foot (H[facility])    Severe protein-calorie malnutrition ([place])    Diabetic polyneuropathy associated with type 2 diabetes mellitus ([place])    Cutaneous abscess of right foot    Cellulitis of right foot 01/30/2019   CKD (chronic kidney disease), stage III ([place]) 01/30/2019   Elevated LFTs 01/30/2019   Colon cancer ([place]) 01/30/2019   Anemia of chronic disease 01/30/2019   Acute on chronic combined systolic and diastolic CHF (congestive heart failure) ([place]) 03/09/2018   AKI (acute kidney injury) ([place]) 03/09/2018   Colonic mass 12/10/2017   S/P TAVR (transcatheter aortic valve  replacement) 03/03/2017   Severe aortic stenosis 12/15/2016   Essential hypertension 05/09/2013   Hyperlipidemia 05/09/2013   PVC's (premature ventricular contractions) 05/09/2013   Chronic combined systolic and diastolic heart failure ([place]) 05/09/2013   Past Medical History:  Diagnosis Date   Anemia    Anxiety    situational- surgery   Asthma    as a child   Cancer ([place]) 2019   colon- colectomy    CHF (congestive heart failure) ([place])    Chronic kidney disease    followed by Dr. Delfina Redwood   Coronary artery disease    Diabetes mellitus without complication ([place])    Type II   Dyslipidemia 10/27/2015   Dyspnea    w/ exertion    Elevated PSA 10/27/2015   Erectile dysfunction 10/27/2015   Heart murmur    Hypertension 10/27/2015   Hypogonadism male 10/27/2015   Neuropathy    Obesity 10/27/2015   Peripheral vascular disease ([place])    Pneumonia 10/27/2015   pt states was 1982   Rotator cuff tear 10/27/2015    Family History  Problem Relation Age of Onset   Diabetes Mother    Heart disease Mother    Pulmonary embolism Father     Past Surgical History:  Procedure Laterality Date   ABDOMINAL AORTOGRAM N/A 02/01/2019   Procedure: ABDOMINAL AORTOGRAM;  Surgeon: Marty Heck, MD;  Location: [facility];  Service: Cardiovascular;  Laterality: N/A;   AMPUTATION Right 02/03/2019   Procedure: RIGHT FOOT 1ST RAY AMPUTATION;  Surgeon: [name], MD;  Location: [facility];  Service: Orthopedics;  Laterality: Right;   AMPUTATION Right 08/02/2019   Procedure: RIGHT 2ND TOE AMPUTATION;  Surgeon: [name], MD;  Location: [facility];  Service: Orthopedics;  Laterality: Right;   AMPUTATION Right 11/08/2020   Procedure: RIGHT TRANSMETATARSAL AMPUTATION;  Surgeon: [name], MD;  Location: [facility];  Service: Orthopedics;  Laterality: Right;   APPLICATION OF WOUND VAC  11/08/2020   Procedure: APPLICATION OF WOUND VAC;  Surgeon: [name], MD;  Location: [facility];  Service:  Orthopedics;;   CARDIAC CATHETERIZATION N/A 11/14/2015   Procedure: Left Heart Cath and Coronary Angiography;  Surgeon: [name], MD;  Location: [facility];  Service: Cardiovascular;  Laterality: N/A;   COLONOSCOPY     LAPAROSCOPIC PARTIAL COLECTOMY  12/15/2017   LAPAROSCOPIC PARTIAL COLECTOMY (N/A Abdomen)   LAPAROSCOPIC PARTIAL COLECTOMY N/A 12/15/2017   Procedure: LAPAROSCOPIC PARTIAL COLECTOMY;  Surgeon: Erroll Luna, MD;  Location: [facility];  Service: General;  Laterality: N/A;   LOWER EXTREMITY ANGIOGRAPHY Right 02/01/2019   Procedure:  LOWER EXTREMITY ANGIOGRAPHY;  Surgeon: Marty Heck, MD;  Location: [facility];  Service: Cardiovascular;  Laterality: Right;   PERIPHERAL VASCULAR INTERVENTION Right 02/01/2019   Procedure: PERIPHERAL VASCULAR INTERVENTION;  Surgeon: Marty Heck, MD;  Location: [facility];  Service: Cardiovascular;  Laterality: Right;  SFA   TEE WITHOUT CARDIOVERSION N/A 12/15/2016   Procedure: TRANSESOPHAGEAL ECHOCARDIOGRAM (TEE);  Surgeon: Sherren Mocha, MD;  Location: [facility];  Service: Open Heart Surgery;  Laterality: N/A;   TRANSCATHETER AORTIC VALVE REPLACEMENT, TRANSFEMORAL N/A 12/15/2016   Procedure: TRANSCATHETER AORTIC VALVE REPLACEMENT, TRANSFEMORAL;  Surgeon: Sherren Mocha, MD;  Location: [facility];  Service: Open Heart Surgery;  Laterality: N/A;   Social History   Occupational History   Not on file  Tobacco Use   Smoking status: Former    Types: Cigarettes    Passive exposure: Never   Smokeless tobacco: Never  Vaping Use   Vaping Use: Never used  Substance and Sexual Activity   Alcohol use: Yes    Comment: rarely   Drug use: No   Sexual activity: Not on file

## 2022-07-06 ENCOUNTER — Encounter: Payer: Self-pay | Admitting: Orthopedic Surgery

## 2022-07-06 ENCOUNTER — Ambulatory Visit: Payer: Medicare HMO | Admitting: Orthopedic Surgery

## 2022-07-06 DIAGNOSIS — Z89431 Acquired absence of right foot: Secondary | ICD-10-CM | POA: Diagnosis not present

## 2022-07-06 DIAGNOSIS — L97511 Non-pressure chronic ulcer of other part of right foot limited to breakdown of skin: Secondary | ICD-10-CM

## 2022-07-06 NOTE — Progress Notes (Signed)
Office Visit Note   Patient: Robert Horn           Date of Birth: 09/15/1933           MRN: [id number] Visit Date: 07/06/2022              Requested by: Renford DillsPolite, [place]ov[place], MD [address] [place],  [place] [id number] PCP: Renford DillsPolite, Ector, MD  Chief Complaint  Patient presents with   Right Foot - Wound Check    Hx right transmet      HPI: Patient is an 89 year old gentleman who presents in follow-up status post application of donated Kerecis tissue graft for Wagner grade 1 ulcer right transmetatarsal amputation.  Assessment & Plan: Visit Diagnoses:  1. History of transmetatarsal amputation of right foot   2. Right foot ulcer, limited to breakdown of skin     Plan: The ulcer is approximately 50% smaller.  Patient will continue with his protected weightbearing continue with his compression sock.  Follow-up in 4 weeks.  Patient states that his daughter has recommended a [facility].  Follow-Up Instructions: Return in about 4 weeks (around 08/03/2022).   Ortho Exam  Patient is alert, oriented, no adenopathy, well-dressed, normal affect, normal respiratory effort. Examination the ulcer is about half the size.  After informed consent a 10 blade knife was used to debride the skin and soft tissue back to bleeding viable granulation tissue.  After debridement the ulcer is 5 x 10 mm and 1 mm deep.  There is no cellulitis no drainage.  Significant improvement after application of the Kerecis micro graft.  Imaging: No results found. No images are attached to the encounter.  Labs: Lab Results  Component Value Date   HGBA1C 6.9 (H) 03/13/2021   HGBA1C 6.9 (H) 11/09/2020   HGBA1C 7.0 (H) 01/31/2019   REPTSTATUS 03/18/2021 FINAL 03/16/2021   CULT >=100,000 COLONIES/mL KLEBSIELLA PNEUMONIAE (A) 03/16/2021   LABORGA KLEBSIELLA PNEUMONIAE (A) 03/16/2021     Lab Results  Component Value Date   ALBUMIN 3.6 03/16/2021   ALBUMIN 3.7 03/12/2021    ALBUMIN 2.3 (L) 02/06/2019    Lab Results  Component Value Date   MG 2.1 02/06/2019   MG 2.3 02/05/2019   MG 2.3 02/04/2019   No results found for: "VD25OH"  No results found for: "PREALBUMIN"    Latest Ref Rng & Units 03/17/2021    6:36 PM 03/16/2021    4:27 PM 03/16/2021    4:23 PM  CBC EXTENDED  WBC 4.0 - 10.5 K/uL 4.4   6.0   RBC 4.22 - 5.81 MIL/uL 4.65   4.76   Hemoglobin 13.0 - 17.0 g/dL 21.313.9  08.615.0  57.813.6   HCT 39.0 - 52.0 % 42.2  44.0  43.7   Platelets 150 - 400 K/uL PLATELET CLUMPS NOTED ON SMEAR, UNABLE TO ESTIMATE   PLATELET CLUMPS NOTED ON SMEAR, UNABLE TO ESTIMATE   NEUT# 1.7 - 7.7 K/uL 1.6   3.3   Lymph# 0.7 - 4.0 K/uL 1.6   1.1      There is no height or weight on file to calculate BMI.  Orders:  No orders of the defined types were placed in this encounter.  No orders of the defined types were placed in this encounter.    Procedures: No procedures performed  Clinical Data: No additional findings.  ROS:  All other systems negative, except as noted in the HPI. Review of Systems  Objective: Vital  Signs: There were no vitals taken for this visit.  Specialty Comments:  No specialty comments available.  PMFS History: Patient Active Problem List   Diagnosis Date Noted   COVID-19 virus infection 03/16/2021   TIA (transient ischemic attack) 03/12/2021   Diabetes mellitus type 2, controlled 03/12/2021   Right foot ulcer, limited to breakdown of skin 02/28/2020   Osteomyelitis of second toe of right foot    Osteomyelitis of great toe of right foot    Severe protein-calorie malnutrition    Diabetic polyneuropathy associated with type 2 diabetes mellitus    Cutaneous abscess of right foot    Cellulitis of right foot 01/30/2019   CKD (chronic kidney disease), stage III (HCC) 01/30/2019   Elevated LFTs 01/30/2019   Colon cancer 01/30/2019   Anemia of chronic disease 01/30/2019   Acute on chronic combined systolic and diastolic CHF (congestive heart  failure) 03/09/2018   AKI (acute kidney injury) 03/09/2018   Colonic mass 12/10/2017   S/P TAVR (transcatheter aortic valve replacement) 03/03/2017   Severe aortic stenosis 12/15/2016   Essential hypertension 05/09/2013   Hyperlipidemia 05/09/2013   PVC's (premature ventricular contractions) 05/09/2013   Chronic combined systolic and diastolic heart failure 05/09/2013   Past Medical History:  Diagnosis Date   Anemia    Anxiety    situational- surgery   Asthma    as a child   Cancer 2019   colon- colectomy    CHF (congestive heart failure)    Chronic kidney disease    followed by Dr. Nehemiah Settle   Coronary artery disease    Diabetes mellitus without complication    Type II   Dyslipidemia 10/27/2015   Dyspnea    w/ exertion    Elevated PSA 10/27/2015   Erectile dysfunction 10/27/2015   Heart murmur    Hypertension 10/27/2015   Hypogonadism male 10/27/2015   Neuropathy    Obesity 10/27/2015   Peripheral vascular disease    Pneumonia 10/27/2015   pt states was 1982   Rotator cuff tear 10/27/2015    Family History  Problem Relation Age of Onset   Diabetes Mother    Heart disease Mother    Pulmonary embolism Father     Past Surgical History:  Procedure Laterality Date   ABDOMINAL AORTOGRAM N/A 02/01/2019   Procedure: ABDOMINAL AORTOGRAM;  Surgeon: Cephus Shelling, MD;  Location: [facility] INVASIVE CV LAB;  Service: Cardiovascular;  Laterality: N/A;   AMPUTATION Right 02/03/2019   Procedure: RIGHT FOOT 1ST RAY AMPUTATION;  Surgeon: Nadara Mustard, MD;  Location: [facility] OR;  Service: Orthopedics;  Laterality: Right;   AMPUTATION Right 08/02/2019   Procedure: RIGHT 2ND TOE AMPUTATION;  Surgeon: Nadara Mustard, MD;  Location: [facility] OR;  Service: Orthopedics;  Laterality: Right;   AMPUTATION Right 11/08/2020   Procedure: RIGHT TRANSMETATARSAL AMPUTATION;  Surgeon: Nadara Mustard, MD;  Location: [facility] OR;  Service: Orthopedics;  Laterality: Right;   APPLICATION OF WOUND VAC  11/08/2020    Procedure: APPLICATION OF WOUND VAC;  Surgeon: Nadara Mustard, MD;  Location: [facility] OR;  Service: Orthopedics;;   CARDIAC CATHETERIZATION N/A 11/14/2015   Procedure: Left Heart Cath and Coronary Angiography;  Surgeon: Lyn Records, MD;  Location: [facility] INVASIVE CV LAB;  Service: Cardiovascular;  Laterality: N/A;   COLONOSCOPY     LAPAROSCOPIC PARTIAL COLECTOMY  12/15/2017   LAPAROSCOPIC PARTIAL COLECTOMY (N/A Abdomen)   LAPAROSCOPIC PARTIAL COLECTOMY N/A 12/15/2017   Procedure: LAPAROSCOPIC PARTIAL COLECTOMY;  Surgeon: Harriette Bouillon, MD;  Location: [facility] OR;  Service: General;  Laterality: N/A;   LOWER EXTREMITY ANGIOGRAPHY Right 02/01/2019   Procedure: LOWER EXTREMITY ANGIOGRAPHY;  Surgeon: Cephus Shelling, MD;  Location: [facility] INVASIVE CV LAB;  Service: Cardiovascular;  Laterality: Right;   PERIPHERAL VASCULAR INTERVENTION Right 02/01/2019   Procedure: PERIPHERAL VASCULAR INTERVENTION;  Surgeon: Cephus Shelling, MD;  Location: [facility] INVASIVE CV LAB;  Service: Cardiovascular;  Laterality: Right;  SFA   TEE WITHOUT CARDIOVERSION N/A 12/15/2016   Procedure: TRANSESOPHAGEAL ECHOCARDIOGRAM (TEE);  Surgeon: Tonny Bollman, MD;  Location: [facility] OR;  Service: Open Heart Surgery;  Laterality: N/A;   TRANSCATHETER AORTIC VALVE REPLACEMENT, TRANSFEMORAL N/A 12/15/2016   Procedure: TRANSCATHETER AORTIC VALVE REPLACEMENT, TRANSFEMORAL;  Surgeon: Tonny Bollman, MD;  Location: [facility] OR;  Service: Open Heart Surgery;  Laterality: N/A;   Social History   Occupational History   Not on file  Tobacco Use   Smoking status: Former    Types: Cigarettes    Passive exposure: Never   Smokeless tobacco: Never  Vaping Use   Vaping Use: Never used  Substance and Sexual Activity   Alcohol use: Yes    Comment: rarely   Drug use: No   Sexual activity: Not on file

## 2022-07-08 DIAGNOSIS — Y838 Other surgical procedures as the cause of abnormal reaction of the patient, or of later complication, without mention of misadventure at the time of the procedure: Secondary | ICD-10-CM | POA: Diagnosis not present

## 2022-07-08 DIAGNOSIS — Z89431 Acquired absence of right foot: Secondary | ICD-10-CM | POA: Diagnosis not present

## 2022-07-08 DIAGNOSIS — L97512 Non-pressure chronic ulcer of other part of right foot with fat layer exposed: Secondary | ICD-10-CM | POA: Diagnosis not present

## 2022-07-08 DIAGNOSIS — T8189XA Other complications of procedures, not elsewhere classified, initial encounter: Secondary | ICD-10-CM | POA: Diagnosis not present

## 2022-07-10 DIAGNOSIS — L97512 Non-pressure chronic ulcer of other part of right foot with fat layer exposed: Secondary | ICD-10-CM | POA: Diagnosis not present

## 2022-07-16 ENCOUNTER — Inpatient Hospital Stay: Payer: Medicare HMO | Admitting: Oncology

## 2022-07-16 ENCOUNTER — Inpatient Hospital Stay: Payer: Medicare HMO | Attending: Oncology

## 2022-07-16 VITALS — BP 124/82 | HR 62 | Temp 98.2°F | Resp 18 | Ht 71.0 in | Wt 234.8 lb

## 2022-07-16 DIAGNOSIS — Z08 Encounter for follow-up examination after completed treatment for malignant neoplasm: Secondary | ICD-10-CM | POA: Insufficient documentation

## 2022-07-16 DIAGNOSIS — C184 Malignant neoplasm of transverse colon: Secondary | ICD-10-CM

## 2022-07-16 DIAGNOSIS — Z85038 Personal history of other malignant neoplasm of large intestine: Secondary | ICD-10-CM | POA: Insufficient documentation

## 2022-07-16 DIAGNOSIS — I110 Hypertensive heart disease with heart failure: Secondary | ICD-10-CM | POA: Insufficient documentation

## 2022-07-16 DIAGNOSIS — I509 Heart failure, unspecified: Secondary | ICD-10-CM | POA: Insufficient documentation

## 2022-07-16 LAB — CEA (ACCESS): CEA (CHCC): 1.82 ng/mL (ref 0.00–5.00)

## 2022-07-16 NOTE — Progress Notes (Signed)
  [facility] OFFICE PROGRESS NOTE   Diagnosis: Colon cancer  INTERVAL HISTORY:   Mr. Leathers returns as scheduled.  He feels well.  No new complaint.  He reports the foot ulcer is healing.  He has not undergone a surveillance colonoscopy.  Objective:  Vital signs in last 24 hours:  Blood pressure 124/82, pulse 62, temperature 98.2 F (36.8 C), temperature source Oral, resp. rate 18, height 5\' 11"  (1.803 m), weight 234 lb 12.8 oz (106.5 kg), SpO2 99 %.    Lymphatics: No cervical, supraclavicular, axillary, or inguinal nodes Resp: Lungs clear bilaterally Cardio: Regular rate and rhythm GI: No hepatosplenomegaly, no mass, nontender Vascular: No leg edema  Lab Results:  Lab Results  Component Value Date   WBC 4.4 03/17/2021   HGB 13.9 03/17/2021   HCT 42.2 03/17/2021   MCV 90.8 03/17/2021   PLT PLATELET CLUMPS NOTED ON SMEAR, UNABLE TO ESTIMATE 03/17/2021   NEUTROABS 1.6 (L) 03/17/2021    CMP  Lab Results  Component Value Date   NA 140 12/02/2021   K 4.1 12/02/2021   CL 103 12/02/2021   CO2 22 12/02/2021   GLUCOSE 134 (H) 12/02/2021   BUN 20 12/02/2021   CREATININE 1.58 (H) 12/02/2021   CALCIUM 9.3 12/02/2021   PROT 7.5 03/16/2021   ALBUMIN 3.6 03/16/2021   AST 34 03/16/2021   ALT 20 03/16/2021   ALKPHOS 44 03/16/2021   BILITOT 0.8 03/16/2021   GFRNONAA 38 (L) 03/17/2021   GFRAA 49 (L) 08/02/2019    Lab Results  Component Value Date   CEA1 2.78 09/17/2020   CEA 1.82 07/16/2022    Medications: I have reviewed the patient's current medications.   Assessment/Plan: Colon cancer, transverse, stage II (T3N0), status post a right and transverse colectomy 12/15/2017 0/25 lymph nodes positive, no lymphovascular or perineural invasion MSI stable, no loss of mismatch repair protein expression CTs 12/10/2017- transverse colon wall thickening, 8 mm transverse mesocolon lymph node, no evidence of metastatic disease, stable small bilateral pulmonary  nodules Colonoscopy 12/09/2017-transverse colon mass, sessile polyps in the descending colon-not removed Colonoscopy 01/13/2019-no polyps, healthy appearing mucosa at the colocolonic anastomosis Polyps on the 12/15/2017 resection specimen- tubular adenomas Enlarged prostate with elevated PSA Status post TAVR 12/15/2016 Hypertension Pneumonia 1982 History of CHF Right first and second toe amputations secondary to osteomyelitis November 2020 in May 2021, right third metatarsal head ulcer/osteomyelitis followed by orthopedics    Disposition: Mr Dragos remains in clinical remission from colon cancer.  He will return for an office visit and CEA in 6 months.  He does not wish to undergo a surveillance colonoscopy.  I will contact Dr. Bosie Clos to see if he Mr Vicary should have a colonoscopy.  Thornton Papas, MD  07/16/2022  3:18 PM

## 2022-07-31 ENCOUNTER — Ambulatory Visit: Payer: Medicare HMO | Admitting: Internal Medicine

## 2022-08-06 ENCOUNTER — Ambulatory Visit: Payer: Medicare HMO | Admitting: Orthopedic Surgery

## 2022-08-10 NOTE — Progress Notes (Signed)
Cardiology Office Note   Date:  08/11/2022   ID:  [name], DOB May 14, 1933, MRN [id number]  PCP:  Renford Dills, MD    No chief complaint on file.    Wt Readings from Last 3 Encounters:  08/11/22 230 lb (104.3 kg)  07/16/22 234 lb 12.8 oz (106.5 kg)  04/06/22 233 lb 6.4 oz (105.9 kg)       History of Present Illness: Robert Horn is a 89 y.o. male  former patient of Dr. Katrinka Blazing, "with a hx of chronic combined S/D CHF, HTN, HLD, colon cancer, s/p TAVR (12/15/16), chronic combined systolic and diastolic HF (EF to 45-50% with mean AV gradient of 12 mmmHg),  S/P multiple amputations right foot, and Covid 19 (02/2021). "    Prior records showed: "EF has nearly normalized on good heart failure therapy. Recent laboratory data did reveal creatinine of 1.61 which is actually an improvement over some previous values, hemoglobin A1c 6.7, potassium 4.3." In 2023: "Still has a nonhealing wound on the right foot stump. For over 1 year he has been having recurrent debridement by Dr. Lajoyce Corners. "  Still getting wound care at [facility].  Uses SBE prophylaxis for dental procedures.   Past Medical History:  Diagnosis Date   Anemia    Anxiety    situational- surgery   Asthma    as a child   Cancer (HCC) 2019   colon- colectomy    CHF (congestive heart failure) (HCC)    Chronic kidney disease    followed by Dr. Nehemiah Settle   Coronary artery disease    Diabetes mellitus without complication (HCC)    Type II   Dyslipidemia 10/27/2015   Dyspnea    w/ exertion    Elevated PSA 10/27/2015   Erectile dysfunction 10/27/2015   Heart murmur    Hypertension 10/27/2015   Hypogonadism male 10/27/2015   Neuropathy    Obesity 10/27/2015   Peripheral vascular disease (HCC)    Pneumonia 10/27/2015   pt states was 1982   Rotator cuff tear 10/27/2015    Past Surgical History:  Procedure Laterality Date   ABDOMINAL AORTOGRAM N/A 02/01/2019   Procedure: ABDOMINAL AORTOGRAM;  Surgeon: Cephus Shelling, MD;  Location: [facility] INVASIVE CV LAB;  Service: Cardiovascular;  Laterality: N/A;   AMPUTATION Right 02/03/2019   Procedure: RIGHT FOOT 1ST RAY AMPUTATION;  Surgeon: Nadara Mustard, MD;  Location: [facility] OR;  Service: Orthopedics;  Laterality: Right;   AMPUTATION Right 08/02/2019   Procedure: RIGHT 2ND TOE AMPUTATION;  Surgeon: Nadara Mustard, MD;  Location: [facility] OR;  Service: Orthopedics;  Laterality: Right;   AMPUTATION Right 11/08/2020   Procedure: RIGHT TRANSMETATARSAL AMPUTATION;  Surgeon: Nadara Mustard, MD;  Location: [facility] OR;  Service: Orthopedics;  Laterality: Right;   APPLICATION OF WOUND VAC  11/08/2020   Procedure: APPLICATION OF WOUND VAC;  Surgeon: Nadara Mustard, MD;  Location: [facility] OR;  Service: Orthopedics;;   CARDIAC CATHETERIZATION N/A 11/14/2015   Procedure: Left Heart Cath and Coronary Angiography;  Surgeon: Lyn Records, MD;  Location: [facility] INVASIVE CV LAB;  Service: Cardiovascular;  Laterality: N/A;   COLONOSCOPY     LAPAROSCOPIC PARTIAL COLECTOMY  12/15/2017   LAPAROSCOPIC PARTIAL COLECTOMY (N/A Abdomen)   LAPAROSCOPIC PARTIAL COLECTOMY N/A 12/15/2017   Procedure: LAPAROSCOPIC PARTIAL COLECTOMY;  Surgeon: Harriette Bouillon, MD;  Location: [facility] OR;  Service: General;  Laterality: N/A;   LOWER EXTREMITY ANGIOGRAPHY Right 02/01/2019   Procedure: LOWER EXTREMITY ANGIOGRAPHY;  Surgeon: Chestine Spore,  Canary Brim, MD;  Location: [facility] INVASIVE CV LAB;  Service: Cardiovascular;  Laterality: Right;   PERIPHERAL VASCULAR INTERVENTION Right 02/01/2019   Procedure: PERIPHERAL VASCULAR INTERVENTION;  Surgeon: Cephus Shelling, MD;  Location: [facility] INVASIVE CV LAB;  Service: Cardiovascular;  Laterality: Right;  SFA   TEE WITHOUT CARDIOVERSION N/A 12/15/2016   Procedure: TRANSESOPHAGEAL ECHOCARDIOGRAM (TEE);  Surgeon: Tonny Bollman, MD;  Location: [facility] OR;  Service: Open Heart Surgery;  Laterality: N/A;   TRANSCATHETER AORTIC VALVE REPLACEMENT, TRANSFEMORAL N/A 12/15/2016   Procedure: TRANSCATHETER AORTIC VALVE  REPLACEMENT, TRANSFEMORAL;  Surgeon: Tonny Bollman, MD;  Location: [facility] OR;  Service: Open Heart Surgery;  Laterality: N/A;     Current Outpatient Medications  Medication Sig Dispense Refill   amLODipine (NORVASC) 10 MG tablet Take 1 tablet (10 mg total) by mouth daily. 90 tablet 3   aspirin 81 MG EC tablet Take 81 mg by mouth daily. Swallow whole.     carvedilol (COREG) 3.125 MG tablet Take 1 tablet (3.125 mg total) by mouth 2 (two) times daily with a meal. 60 tablet 11   Cholecalciferol (VITAMIN D) 50 MCG (2000 UT) tablet Take 2,000 Units by mouth daily.     clopidogrel (PLAVIX) 75 MG tablet Take 1 tablet (75 mg total) by mouth daily. 30 tablet 11   diclofenac Sodium (VOLTAREN) 1 % GEL Apply 4 g topically as needed.     ferrous sulfate 325 (65 FE) MG tablet Take 325 mg by mouth daily with breakfast.     furosemide (LASIX) 40 MG tablet TAKE 1.5 TABLETS BY MOUTH DAILY. 45 tablet 11   JARDIANCE 25 MG TABS tablet Take 25 mg by mouth daily.     Multiple Vitamin (MULTIVITAMIN WITH MINERALS) TABS tablet Take 1 tablet by mouth daily. 30 tablet 1   omega-3 acid ethyl esters (LOVAZA) 1 g capsule Take 1 capsule (1 g total) by mouth daily. 90 capsule 3   oxyCODONE-acetaminophen (PERCOCET/ROXICET) 5-325 MG tablet Take 1 tablet by mouth every 4 (four) hours as needed for severe pain. 30 tablet 0   rosuvastatin (CRESTOR) 40 MG tablet Take 1 tablet (40 mg total) by mouth daily. 30 tablet 5   sacubitril-valsartan (ENTRESTO) 49-51 MG Take 1 tablet by mouth 2 (two) times daily. Please attend scheduled appointment for additional refills. 60 tablet 11   tiZANidine (ZANAFLEX) 2 MG tablet 2 mg 3 (three) times daily as needed for muscle spasms.     VITAMIN B COMPLEX-C PO Take 1 tablet by mouth daily.     Wound Dressings (HYDROFERA BLUE 4"X4") PADS Apply 1 Application topically daily. 2 each 0   No current facility-administered medications for this visit.    Allergies:   Ticagrelor    Social History:  The  patient  reports that he has quit smoking. His smoking use included cigarettes. He has never been exposed to tobacco smoke. He has never used smokeless tobacco. He reports current alcohol use. He reports that he does not use drugs.   Family History:  The patient's family history includes Diabetes in his mother; Heart disease in his mother; Pulmonary embolism in his father.    ROS:  Please see the history of present illness.   Otherwise, review of systems are positive for right foot nonhealing wound.   All other systems are reviewed and negative.    PHYSICAL EXAM: VS:  BP 116/64   Pulse (!) 54   Ht 6' (1.829 m)   Wt 230 lb (104.3 kg)   SpO2 96%  BMI 31.19 kg/m  , BMI Body mass index is 31.19 kg/m. GEN: Well nourished, well developed, in no acute distress HEENT: normal Neck: no JVD, carotid bruits, or masses Cardiac: RRR; 2/6 early systolic murmurs, no rubs, or gallops,no edema  Respiratory:  clear to auscultation bilaterally, normal work of breathing GI: soft, nontender, nondistended, + BS MS: right foot bandaged and in plastic bag Skin: warm and dry, no rash Neuro:  Strength and sensation are intact Psych: euthymic mood, full affect   Recent Labs: 12/02/2021: BUN 20; Creatinine, Ser 1.58; Potassium 4.1; Sodium 140   Lipid Panel    Component Value Date/Time   CHOL 185 05/13/2021 0959   TRIG 113 05/13/2021 0959   HDL 58 05/13/2021 0959   CHOLHDL 3.2 05/13/2021 0959   CHOLHDL 2.9 03/17/2021 0354   VLDL 15 03/17/2021 0354   LDLCALC 107 (H) 05/13/2021 0959     Other studies Reviewed: Additional studies/ records that were reviewed today with results demonstrating: labs reviewed.   ASSESSMENT AND PLAN:  Chronic combined systolic/diastolic heart failure: Appears euvolemic.  EF improved after TAVR.  Continue furosemide.  Check kidney function today. PAD: Follows with vascular surgery.  Amputation of toes on the right foot. Hyperlipidemia: Lovaza not covered. Continue  rosuvastatin.  Add Zetia 10 mg daily.   Whole food, plant-based diet.  High-fiber diet.  Avoid processed foods. CKD: Stay hydrated, avoid nephrotoxins.  Recheck BMet. S/p TAVR: needs SBE prophylaxis.  No CHF on exam.  His presentation with his aortic stenosis was actually severe volume overload, shortness of breath, respiratory failure.   Current medicines are reviewed at length with the patient today.  The patient concerns regarding his medicines were addressed.  The following changes have been made:  No change  Labs/ tests ordered today include:  No orders of the defined types were placed in this encounter.   Recommend 150 minutes/week of aerobic exercise Low fat, low carb, high fiber diet recommended  Disposition:   FU in 9 months   Signed, Lance Muss, MD  08/11/2022 3:51 PM    [facility] [address] [place], [place], [place]  [address] Phone: [phone]; Fax: [phone]

## 2022-08-11 ENCOUNTER — Ambulatory Visit: Payer: Medicare HMO | Attending: Interventional Cardiology | Admitting: Interventional Cardiology

## 2022-08-11 ENCOUNTER — Encounter: Payer: Self-pay | Admitting: Interventional Cardiology

## 2022-08-11 VITALS — BP 116/64 | HR 54 | Ht 72.0 in | Wt 230.0 lb

## 2022-08-11 DIAGNOSIS — E782 Mixed hyperlipidemia: Secondary | ICD-10-CM | POA: Diagnosis not present

## 2022-08-11 DIAGNOSIS — Z952 Presence of prosthetic heart valve: Secondary | ICD-10-CM | POA: Diagnosis not present

## 2022-08-11 DIAGNOSIS — I10 Essential (primary) hypertension: Secondary | ICD-10-CM

## 2022-08-11 DIAGNOSIS — Z89431 Acquired absence of right foot: Secondary | ICD-10-CM | POA: Diagnosis not present

## 2022-08-11 DIAGNOSIS — N182 Chronic kidney disease, stage 2 (mild): Secondary | ICD-10-CM | POA: Diagnosis not present

## 2022-08-11 DIAGNOSIS — I739 Peripheral vascular disease, unspecified: Secondary | ICD-10-CM | POA: Diagnosis not present

## 2022-08-11 DIAGNOSIS — L97412 Non-pressure chronic ulcer of right heel and midfoot with fat layer exposed: Secondary | ICD-10-CM | POA: Diagnosis not present

## 2022-08-11 DIAGNOSIS — I5042 Chronic combined systolic (congestive) and diastolic (congestive) heart failure: Secondary | ICD-10-CM

## 2022-08-11 DIAGNOSIS — E11621 Type 2 diabetes mellitus with foot ulcer: Secondary | ICD-10-CM | POA: Diagnosis not present

## 2022-08-11 MED ORDER — EZETIMIBE 10 MG PO TABS: 10.0000 mg | ORAL_TABLET | Freq: Every day | ORAL | 3 refills | Status: DC

## 2022-08-11 NOTE — Patient Instructions (Addendum)
Medication Instructions:  Your physician has recommended you make the following change in your medication: Start  ezetimibe 10 mg by mouth daily   *If you need a refill on your cardiac medications before your next appointment, please call your pharmacy*   Lab Work: Lab work to be done today--BMP If you have labs (blood work) drawn today and your tests are completely normal, you will receive your results only by: MyChart Message (if you have MyChart) OR A paper copy in the mail If you have any lab test that is abnormal or we need to change your treatment, we will call you to review the results.   Testing/Procedures: none   Follow-Up: At [facility], you and your health needs are our priority.  As part of our continuing mission to provide you with exceptional heart care, we have created designated Provider Care Teams.  These Care Teams include your primary Cardiologist (physician) and Advanced Practice Providers (APPs -  Physician Assistants and Nurse Practitioners) who all work together to provide you with the care you need, when you need it.  We recommend signing up for the patient portal called "MyChart".  Sign up information is provided on this After Visit Summary.  MyChart is used to connect with patients for Virtual Visits (Telemedicine).  Patients are able to view lab/test results, encounter notes, upcoming appointments, etc.  Non-urgent messages can be sent to your provider as well.   To learn more about what you can do with MyChart, go to [email or web address].    Your next appointment:   9 month(s)  Provider:   Lance Muss, MD     Other Instructions

## 2022-08-12 LAB — BASIC METABOLIC PANEL
BUN/Creatinine Ratio: 16 (ref 10–24)
BUN: 24 mg/dL (ref 8–27)
CO2: 25 mmol/L (ref 20–29)
Calcium: 9.5 mg/dL (ref 8.6–10.2)
Chloride: 103 mmol/L (ref 96–106)
Creatinine, Ser: 1.54 mg/dL — ABNORMAL HIGH (ref 0.76–1.27)
Glucose: 124 mg/dL — ABNORMAL HIGH (ref 70–99)
Potassium: 4.2 mmol/L (ref 3.5–5.2)
Sodium: 142 mmol/L (ref 134–144)
eGFR: 44 mL/min/{1.73_m2} — ABNORMAL LOW (ref >59)

## 2022-08-26 DIAGNOSIS — Z89411 Acquired absence of right great toe: Secondary | ICD-10-CM | POA: Diagnosis not present

## 2022-08-26 DIAGNOSIS — N1831 Chronic kidney disease, stage 3a: Secondary | ICD-10-CM | POA: Diagnosis not present

## 2022-08-26 DIAGNOSIS — E1122 Type 2 diabetes mellitus with diabetic chronic kidney disease: Secondary | ICD-10-CM | POA: Diagnosis not present

## 2022-08-26 DIAGNOSIS — Z952 Presence of prosthetic heart valve: Secondary | ICD-10-CM | POA: Diagnosis not present

## 2022-08-26 DIAGNOSIS — I130 Hypertensive heart and chronic kidney disease with heart failure and stage 1 through stage 4 chronic kidney disease, or unspecified chronic kidney disease: Secondary | ICD-10-CM | POA: Diagnosis not present

## 2022-08-26 DIAGNOSIS — Z89421 Acquired absence of other right toe(s): Secondary | ICD-10-CM | POA: Diagnosis not present

## 2022-08-26 DIAGNOSIS — E1151 Type 2 diabetes mellitus with diabetic peripheral angiopathy without gangrene: Secondary | ICD-10-CM | POA: Diagnosis not present

## 2022-08-26 DIAGNOSIS — I5040 Unspecified combined systolic (congestive) and diastolic (congestive) heart failure: Secondary | ICD-10-CM | POA: Diagnosis not present

## 2022-08-26 DIAGNOSIS — E11621 Type 2 diabetes mellitus with foot ulcer: Secondary | ICD-10-CM | POA: Diagnosis not present

## 2022-08-26 DIAGNOSIS — E7800 Pure hypercholesterolemia, unspecified: Secondary | ICD-10-CM | POA: Diagnosis not present

## 2022-08-26 DIAGNOSIS — Z85038 Personal history of other malignant neoplasm of large intestine: Secondary | ICD-10-CM | POA: Diagnosis not present

## 2022-08-26 DIAGNOSIS — Z1331 Encounter for screening for depression: Secondary | ICD-10-CM | POA: Diagnosis not present

## 2022-08-26 DIAGNOSIS — L97511 Non-pressure chronic ulcer of other part of right foot limited to breakdown of skin: Secondary | ICD-10-CM | POA: Diagnosis not present

## 2022-08-26 DIAGNOSIS — Z0000 Encounter for general adult medical examination without abnormal findings: Secondary | ICD-10-CM | POA: Diagnosis not present

## 2022-08-27 DIAGNOSIS — Y838 Other surgical procedures as the cause of abnormal reaction of the patient, or of later complication, without mention of misadventure at the time of the procedure: Secondary | ICD-10-CM | POA: Diagnosis not present

## 2022-08-27 DIAGNOSIS — T8189XD Other complications of procedures, not elsewhere classified, subsequent encounter: Secondary | ICD-10-CM | POA: Diagnosis not present

## 2022-08-27 DIAGNOSIS — T8189XA Other complications of procedures, not elsewhere classified, initial encounter: Secondary | ICD-10-CM | POA: Diagnosis not present

## 2022-08-27 DIAGNOSIS — L97519 Non-pressure chronic ulcer of other part of right foot with unspecified severity: Secondary | ICD-10-CM | POA: Diagnosis not present

## 2022-08-27 DIAGNOSIS — L97512 Non-pressure chronic ulcer of other part of right foot with fat layer exposed: Secondary | ICD-10-CM | POA: Diagnosis not present

## 2022-09-03 DIAGNOSIS — T8189XA Other complications of procedures, not elsewhere classified, initial encounter: Secondary | ICD-10-CM | POA: Diagnosis not present

## 2022-09-03 DIAGNOSIS — L97512 Non-pressure chronic ulcer of other part of right foot with fat layer exposed: Secondary | ICD-10-CM | POA: Diagnosis not present

## 2022-09-03 DIAGNOSIS — T8189XD Other complications of procedures, not elsewhere classified, subsequent encounter: Secondary | ICD-10-CM | POA: Diagnosis not present

## 2022-09-03 DIAGNOSIS — Y838 Other surgical procedures as the cause of abnormal reaction of the patient, or of later complication, without mention of misadventure at the time of the procedure: Secondary | ICD-10-CM | POA: Diagnosis not present

## 2022-09-10 DIAGNOSIS — T8189XD Other complications of procedures, not elsewhere classified, subsequent encounter: Secondary | ICD-10-CM | POA: Diagnosis not present

## 2022-09-10 DIAGNOSIS — L97512 Non-pressure chronic ulcer of other part of right foot with fat layer exposed: Secondary | ICD-10-CM | POA: Diagnosis not present

## 2022-09-10 DIAGNOSIS — Y838 Other surgical procedures as the cause of abnormal reaction of the patient, or of later complication, without mention of misadventure at the time of the procedure: Secondary | ICD-10-CM | POA: Diagnosis not present

## 2022-09-10 DIAGNOSIS — T8189XA Other complications of procedures, not elsewhere classified, initial encounter: Secondary | ICD-10-CM | POA: Diagnosis not present

## 2022-09-17 DIAGNOSIS — T8189XA Other complications of procedures, not elsewhere classified, initial encounter: Secondary | ICD-10-CM | POA: Diagnosis not present

## 2022-09-17 DIAGNOSIS — Y838 Other surgical procedures as the cause of abnormal reaction of the patient, or of later complication, without mention of misadventure at the time of the procedure: Secondary | ICD-10-CM | POA: Diagnosis not present

## 2022-09-17 DIAGNOSIS — L97511 Non-pressure chronic ulcer of other part of right foot limited to breakdown of skin: Secondary | ICD-10-CM | POA: Diagnosis not present

## 2022-09-17 DIAGNOSIS — T8189XD Other complications of procedures, not elsewhere classified, subsequent encounter: Secondary | ICD-10-CM | POA: Diagnosis not present

## 2022-09-24 DIAGNOSIS — L97411 Non-pressure chronic ulcer of right heel and midfoot limited to breakdown of skin: Secondary | ICD-10-CM | POA: Diagnosis not present

## 2022-09-24 DIAGNOSIS — T8189XA Other complications of procedures, not elsewhere classified, initial encounter: Secondary | ICD-10-CM | POA: Diagnosis not present

## 2022-09-24 DIAGNOSIS — Y839 Surgical procedure, unspecified as the cause of abnormal reaction of the patient, or of later complication, without mention of misadventure at the time of the procedure: Secondary | ICD-10-CM | POA: Diagnosis not present

## 2022-09-24 DIAGNOSIS — T8189XD Other complications of procedures, not elsewhere classified, subsequent encounter: Secondary | ICD-10-CM | POA: Diagnosis not present

## 2022-09-30 DIAGNOSIS — T8189XD Other complications of procedures, not elsewhere classified, subsequent encounter: Secondary | ICD-10-CM | POA: Diagnosis not present

## 2022-09-30 DIAGNOSIS — Z872 Personal history of diseases of the skin and subcutaneous tissue: Secondary | ICD-10-CM | POA: Diagnosis not present

## 2022-09-30 DIAGNOSIS — Z09 Encounter for follow-up examination after completed treatment for conditions other than malignant neoplasm: Secondary | ICD-10-CM | POA: Diagnosis not present

## 2022-10-29 DIAGNOSIS — Y838 Other surgical procedures as the cause of abnormal reaction of the patient, or of later complication, without mention of misadventure at the time of the procedure: Secondary | ICD-10-CM | POA: Diagnosis not present

## 2022-10-29 DIAGNOSIS — L97512 Non-pressure chronic ulcer of other part of right foot with fat layer exposed: Secondary | ICD-10-CM | POA: Diagnosis not present

## 2022-10-29 DIAGNOSIS — T8189XD Other complications of procedures, not elsewhere classified, subsequent encounter: Secondary | ICD-10-CM | POA: Diagnosis not present

## 2022-10-29 DIAGNOSIS — T8189XA Other complications of procedures, not elsewhere classified, initial encounter: Secondary | ICD-10-CM | POA: Diagnosis not present

## 2022-11-05 DIAGNOSIS — L97412 Non-pressure chronic ulcer of right heel and midfoot with fat layer exposed: Secondary | ICD-10-CM | POA: Diagnosis not present

## 2022-11-05 DIAGNOSIS — Y838 Other surgical procedures as the cause of abnormal reaction of the patient, or of later complication, without mention of misadventure at the time of the procedure: Secondary | ICD-10-CM | POA: Diagnosis not present

## 2022-11-05 DIAGNOSIS — T8189XD Other complications of procedures, not elsewhere classified, subsequent encounter: Secondary | ICD-10-CM | POA: Diagnosis not present

## 2022-11-05 DIAGNOSIS — T8189XA Other complications of procedures, not elsewhere classified, initial encounter: Secondary | ICD-10-CM | POA: Diagnosis not present

## 2022-11-12 DIAGNOSIS — T8189XD Other complications of procedures, not elsewhere classified, subsequent encounter: Secondary | ICD-10-CM | POA: Diagnosis not present

## 2022-11-19 DIAGNOSIS — I130 Hypertensive heart and chronic kidney disease with heart failure and stage 1 through stage 4 chronic kidney disease, or unspecified chronic kidney disease: Secondary | ICD-10-CM | POA: Diagnosis not present

## 2022-11-19 DIAGNOSIS — I5040 Unspecified combined systolic (congestive) and diastolic (congestive) heart failure: Secondary | ICD-10-CM | POA: Diagnosis not present

## 2022-11-19 DIAGNOSIS — E1122 Type 2 diabetes mellitus with diabetic chronic kidney disease: Secondary | ICD-10-CM | POA: Diagnosis not present

## 2022-11-19 DIAGNOSIS — N1832 Chronic kidney disease, stage 3b: Secondary | ICD-10-CM | POA: Diagnosis not present

## 2022-11-19 DIAGNOSIS — E663 Overweight: Secondary | ICD-10-CM | POA: Diagnosis not present

## 2022-11-19 DIAGNOSIS — L97511 Non-pressure chronic ulcer of other part of right foot limited to breakdown of skin: Secondary | ICD-10-CM | POA: Diagnosis not present

## 2022-11-19 DIAGNOSIS — Z89411 Acquired absence of right great toe: Secondary | ICD-10-CM | POA: Diagnosis not present

## 2022-11-19 DIAGNOSIS — Z952 Presence of prosthetic heart valve: Secondary | ICD-10-CM | POA: Diagnosis not present

## 2022-11-19 DIAGNOSIS — E1151 Type 2 diabetes mellitus with diabetic peripheral angiopathy without gangrene: Secondary | ICD-10-CM | POA: Diagnosis not present

## 2022-11-19 DIAGNOSIS — E7800 Pure hypercholesterolemia, unspecified: Secondary | ICD-10-CM | POA: Diagnosis not present

## 2022-11-19 DIAGNOSIS — E11621 Type 2 diabetes mellitus with foot ulcer: Secondary | ICD-10-CM | POA: Diagnosis not present

## 2022-11-19 DIAGNOSIS — Z89421 Acquired absence of other right toe(s): Secondary | ICD-10-CM | POA: Diagnosis not present

## 2022-11-19 DIAGNOSIS — I739 Peripheral vascular disease, unspecified: Secondary | ICD-10-CM | POA: Diagnosis not present

## 2022-12-03 DIAGNOSIS — Z872 Personal history of diseases of the skin and subcutaneous tissue: Secondary | ICD-10-CM | POA: Diagnosis not present

## 2022-12-03 DIAGNOSIS — Z08 Encounter for follow-up examination after completed treatment for malignant neoplasm: Secondary | ICD-10-CM | POA: Diagnosis not present

## 2022-12-03 DIAGNOSIS — T8189XD Other complications of procedures, not elsewhere classified, subsequent encounter: Secondary | ICD-10-CM | POA: Diagnosis not present

## 2022-12-10 DIAGNOSIS — H35033 Hypertensive retinopathy, bilateral: Secondary | ICD-10-CM | POA: Diagnosis not present

## 2022-12-10 DIAGNOSIS — H02889 Meibomian gland dysfunction of unspecified eye, unspecified eyelid: Secondary | ICD-10-CM | POA: Diagnosis not present

## 2022-12-10 DIAGNOSIS — H2513 Age-related nuclear cataract, bilateral: Secondary | ICD-10-CM | POA: Diagnosis not present

## 2022-12-10 DIAGNOSIS — E119 Type 2 diabetes mellitus without complications: Secondary | ICD-10-CM | POA: Diagnosis not present

## 2022-12-24 DIAGNOSIS — L97511 Non-pressure chronic ulcer of other part of right foot limited to breakdown of skin: Secondary | ICD-10-CM | POA: Diagnosis not present

## 2022-12-24 DIAGNOSIS — G629 Polyneuropathy, unspecified: Secondary | ICD-10-CM | POA: Diagnosis not present

## 2022-12-24 DIAGNOSIS — Y838 Other surgical procedures as the cause of abnormal reaction of the patient, or of later complication, without mention of misadventure at the time of the procedure: Secondary | ICD-10-CM | POA: Diagnosis not present

## 2022-12-24 DIAGNOSIS — T8189XD Other complications of procedures, not elsewhere classified, subsequent encounter: Secondary | ICD-10-CM | POA: Diagnosis not present

## 2022-12-24 DIAGNOSIS — L97512 Non-pressure chronic ulcer of other part of right foot with fat layer exposed: Secondary | ICD-10-CM | POA: Diagnosis not present

## 2022-12-24 DIAGNOSIS — T8189XA Other complications of procedures, not elsewhere classified, initial encounter: Secondary | ICD-10-CM | POA: Diagnosis not present

## 2022-12-28 DIAGNOSIS — L97512 Non-pressure chronic ulcer of other part of right foot with fat layer exposed: Secondary | ICD-10-CM | POA: Diagnosis not present

## 2022-12-30 ENCOUNTER — Encounter: Payer: Self-pay | Admitting: Interventional Cardiology

## 2023-01-05 ENCOUNTER — Inpatient Hospital Stay: Payer: Medicare HMO

## 2023-01-05 ENCOUNTER — Inpatient Hospital Stay: Payer: Medicare HMO | Admitting: Oncology

## 2023-01-09 IMAGING — MR MR FOOT*R* W/O CM
5 series · 40 of 40 positions shown · non-contrast
Comparison: None.

CLINICAL DATA: Chronic foot ulcer. Ulcer underneath the third
metatarsal head.

EXAM:
MRI OF THE RIGHT FOREFOOT WITHOUT CONTRAST
TECHNIQUE: Multiplanar, multisequence MR imaging of the right forefoot was
performed. No intravenous contrast was administered.

[Series 4: T1 · coronal · left · 3.0mm · 0.31mm/px · 10 of 42 slices shown (1 of 2)]
[im 1/42]
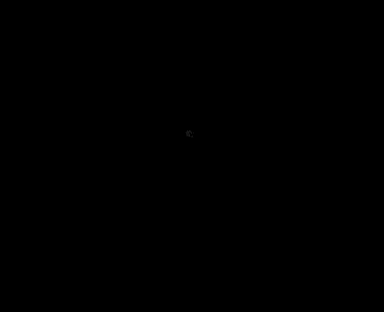
[im 5/42]
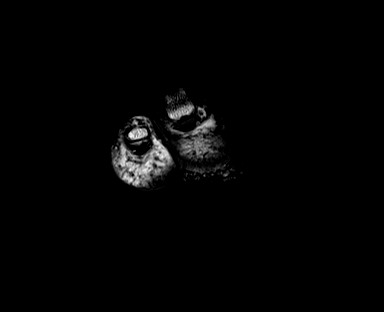
[im 10/42]
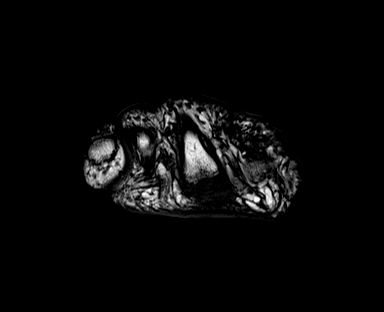
[im 14/42]
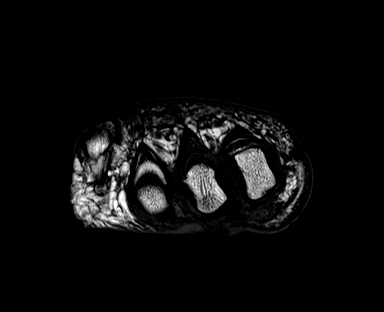
[im 19/42]
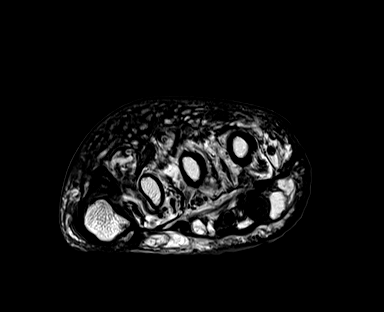
[im 23/42]
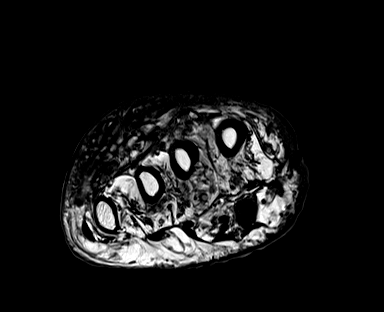
[im 28/42]
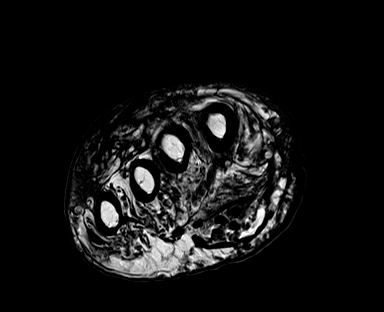
[im 32/42]
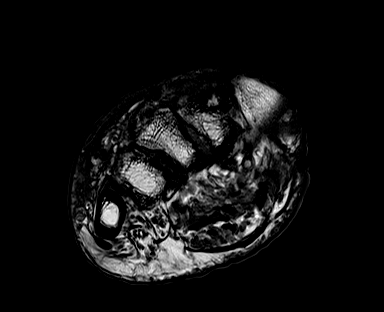
[im 37/42]
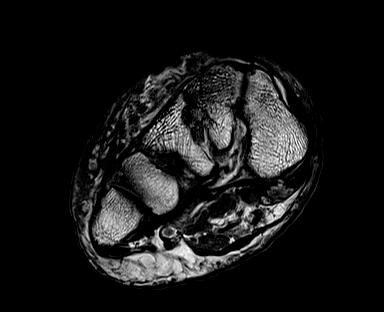
[im 42/42]
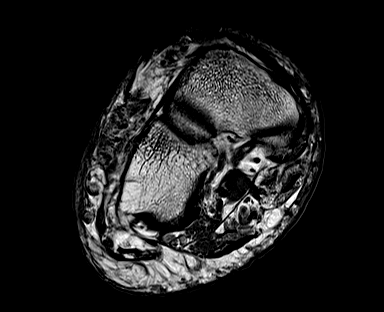

[Series 5: T2 fat-sat · coronal · left · 3.0mm · 0.31mm/px · 11 of 42 slices shown (1 of 2)]
[im 1/42]
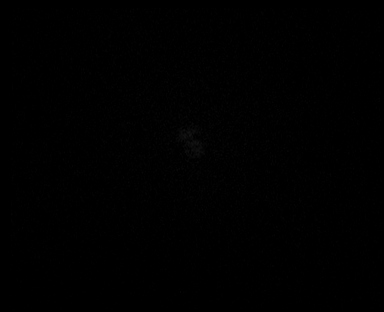
[im 5/42]
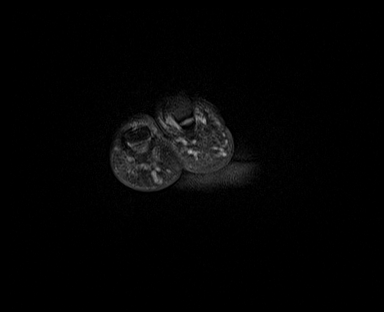
[im 9/42]
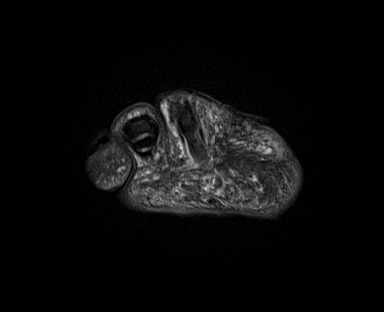
[im 13/42]
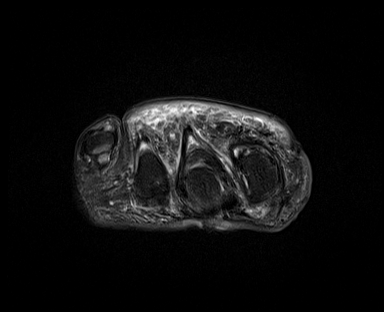
[im 17/42]
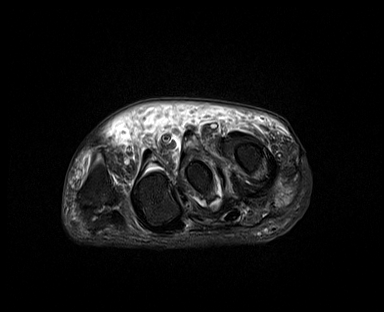
[im 21/42]
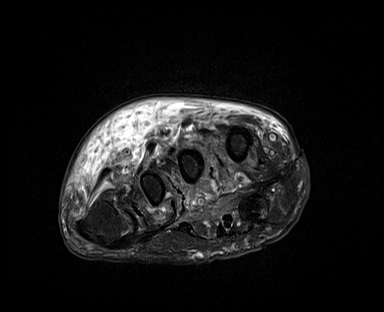
[im 25/42]
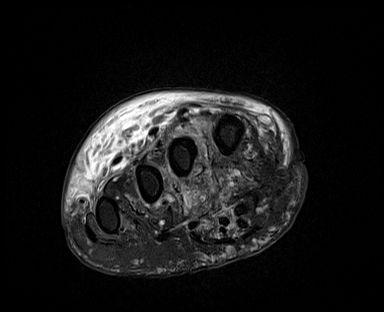
[im 29/42]
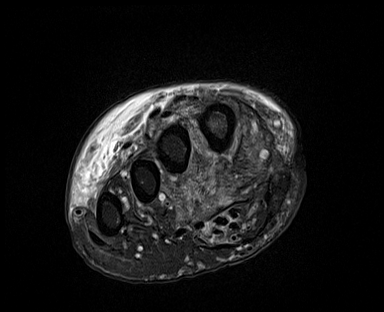
[im 33/42]
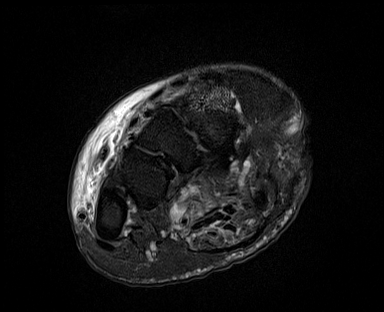
[im 37/42]
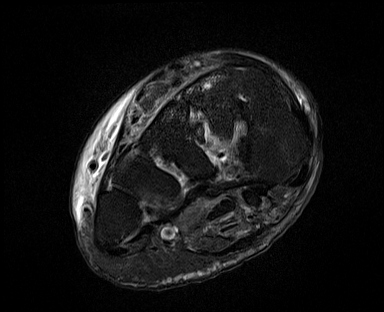
[im 42/42]
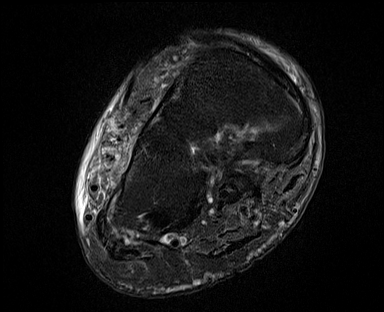

[Series 6: STIR · sagittal · left · 3.0mm · 0.70mm/px · 7 of 29 slices shown]
[im 1/29]
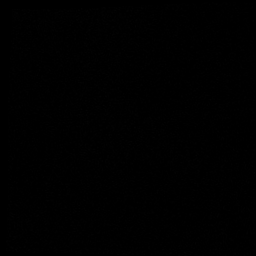
[im 5/29]
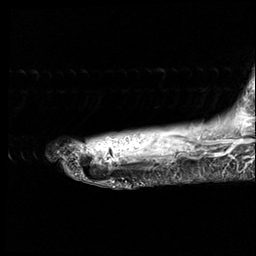
[im 10/29]
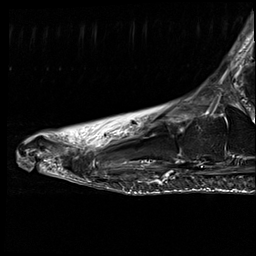
[im 15/29]
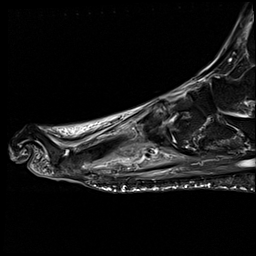
[im 19/29]
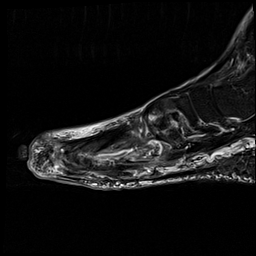
[im 24/29]
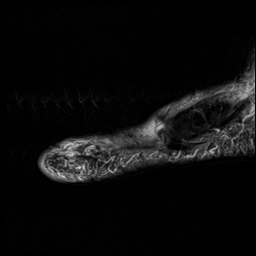
[im 29/29]
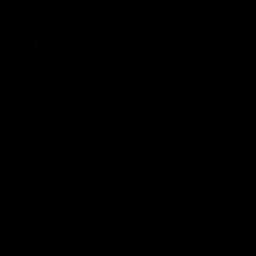

[Series 7: T1 · axial · left · 3.0mm · 0.47mm/px · z∈[-75,+7]mm · 6 of 23 slices shown (2 of 2)]
[im 1/23]
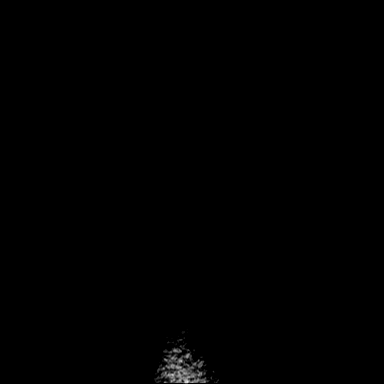
[im 5/23]
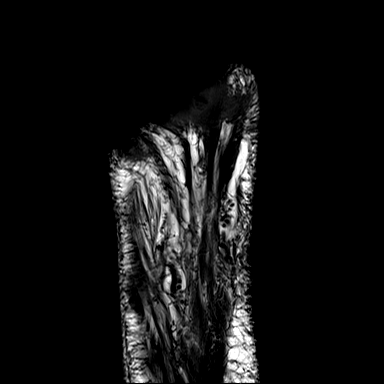
[im 9/23]
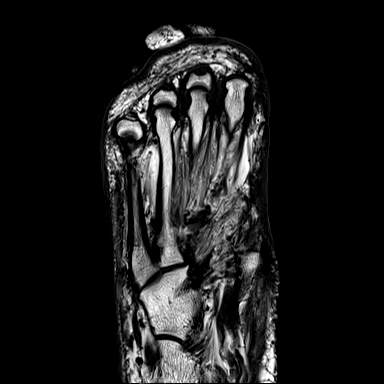
[im 14/23]
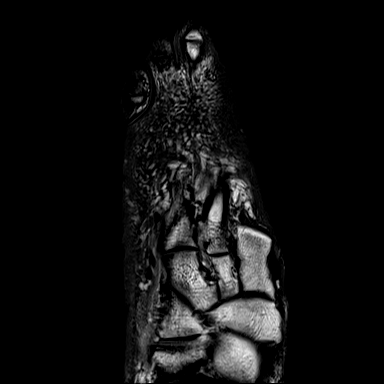
[im 18/23]
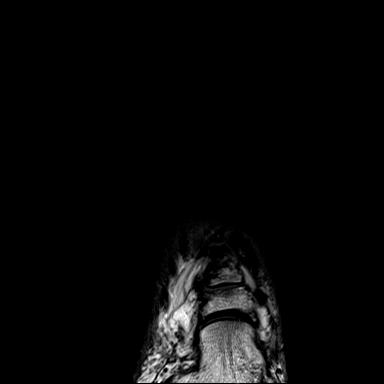
[im 23/23]
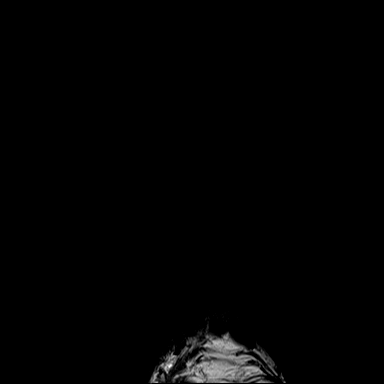

[Series 8: T2 fat-sat · axial · left · 3.0mm · 0.47mm/px · z∈[-75,+7]mm · 6 of 23 slices shown (2 of 2)]
[im 1/23]
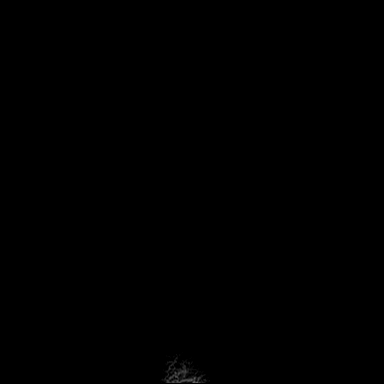
[im 5/23]
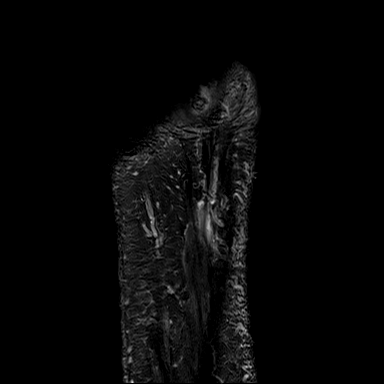
[im 9/23]
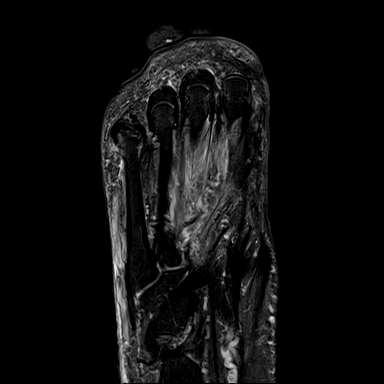
[im 14/23]
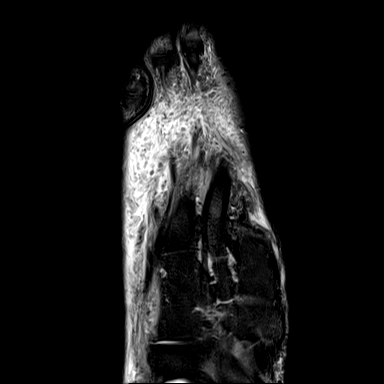
[im 18/23]
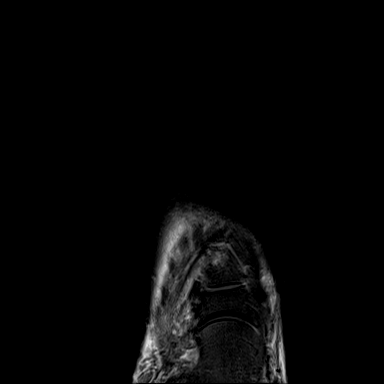
[im 23/23]
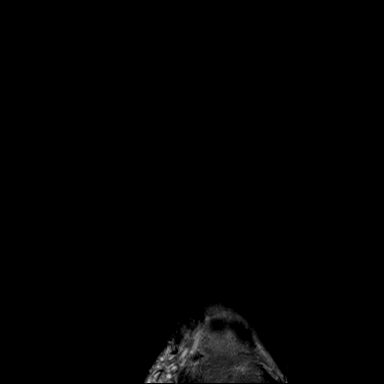

[40 of 40 positions shown; findings below may reference images not displayed]

FINDINGS: Bones/Joint/Cartilage

Prior amputation of the first ray and second phalanges.

Soft tissue ulcer along the plantar aspect of the third metatarsal
head. Subcortical bone marrow edema within the third metatarsal head
concerning for osteomyelitis versus reactive marrow edema.

Moderate-severe osteoarthritis of the second tarsometatarsal joint
with subchondral reactive marrow changes.

No other marrow signal abnormality. Normal alignment. No joint
effusion.

Ligaments

Collateral ligaments are intact.  Lisfranc ligament is intact.

Muscles and Tendons
Flexor, peroneal and extensor compartment tendons are intact.
Generalized muscle atrophy of the plantar musculature.

Soft tissue
No fluid collection or hematoma.  No soft tissue mass.
IMPRESSION: Soft tissue ulcer along the plantar aspect of the third metatarsal
head. Subcortical bone marrow edema within the third metatarsal head
concerning for osteomyelitis versus reactive marrow edema. No focal
fluid collection to suggest an abscess.

## 2023-01-18 DIAGNOSIS — M898X9 Other specified disorders of bone, unspecified site: Secondary | ICD-10-CM | POA: Diagnosis not present

## 2023-01-18 DIAGNOSIS — I739 Peripheral vascular disease, unspecified: Secondary | ICD-10-CM | POA: Diagnosis not present

## 2023-01-18 DIAGNOSIS — L97412 Non-pressure chronic ulcer of right heel and midfoot with fat layer exposed: Secondary | ICD-10-CM | POA: Diagnosis not present

## 2023-01-18 DIAGNOSIS — E11621 Type 2 diabetes mellitus with foot ulcer: Secondary | ICD-10-CM | POA: Diagnosis not present

## 2023-01-18 DIAGNOSIS — N183 Chronic kidney disease, stage 3 unspecified: Secondary | ICD-10-CM | POA: Diagnosis not present

## 2023-01-18 DIAGNOSIS — Z952 Presence of prosthetic heart valve: Secondary | ICD-10-CM | POA: Diagnosis not present

## 2023-01-18 DIAGNOSIS — L97509 Non-pressure chronic ulcer of other part of unspecified foot with unspecified severity: Secondary | ICD-10-CM | POA: Diagnosis not present

## 2023-01-21 DIAGNOSIS — Z85038 Personal history of other malignant neoplasm of large intestine: Secondary | ICD-10-CM | POA: Diagnosis not present

## 2023-01-21 DIAGNOSIS — I5040 Unspecified combined systolic (congestive) and diastolic (congestive) heart failure: Secondary | ICD-10-CM | POA: Diagnosis not present

## 2023-02-04 DIAGNOSIS — R32 Unspecified urinary incontinence: Secondary | ICD-10-CM | POA: Diagnosis not present

## 2023-02-04 DIAGNOSIS — I959 Hypotension, unspecified: Secondary | ICD-10-CM | POA: Diagnosis not present

## 2023-02-09 ENCOUNTER — Ambulatory Visit: Payer: Medicare HMO

## 2023-02-09 ENCOUNTER — Encounter (HOSPITAL_COMMUNITY): Payer: Medicare HMO

## 2023-02-11 DIAGNOSIS — Z8673 Personal history of transient ischemic attack (TIA), and cerebral infarction without residual deficits: Secondary | ICD-10-CM | POA: Diagnosis not present

## 2023-02-11 DIAGNOSIS — L97412 Non-pressure chronic ulcer of right heel and midfoot with fat layer exposed: Secondary | ICD-10-CM | POA: Diagnosis not present

## 2023-02-11 DIAGNOSIS — E1122 Type 2 diabetes mellitus with diabetic chronic kidney disease: Secondary | ICD-10-CM | POA: Diagnosis not present

## 2023-02-11 DIAGNOSIS — I5043 Acute on chronic combined systolic (congestive) and diastolic (congestive) heart failure: Secondary | ICD-10-CM | POA: Diagnosis not present

## 2023-02-11 DIAGNOSIS — Z9049 Acquired absence of other specified parts of digestive tract: Secondary | ICD-10-CM | POA: Diagnosis not present

## 2023-02-11 DIAGNOSIS — E7849 Other hyperlipidemia: Secondary | ICD-10-CM | POA: Diagnosis not present

## 2023-02-11 DIAGNOSIS — I350 Nonrheumatic aortic (valve) stenosis: Secondary | ICD-10-CM | POA: Diagnosis not present

## 2023-02-11 DIAGNOSIS — Z952 Presence of prosthetic heart valve: Secondary | ICD-10-CM | POA: Diagnosis not present

## 2023-02-11 DIAGNOSIS — Z01818 Encounter for other preprocedural examination: Secondary | ICD-10-CM | POA: Diagnosis not present

## 2023-02-11 DIAGNOSIS — E11621 Type 2 diabetes mellitus with foot ulcer: Secondary | ICD-10-CM | POA: Diagnosis not present

## 2023-02-11 DIAGNOSIS — I129 Hypertensive chronic kidney disease with stage 1 through stage 4 chronic kidney disease, or unspecified chronic kidney disease: Secondary | ICD-10-CM | POA: Diagnosis not present

## 2023-02-11 DIAGNOSIS — Z85038 Personal history of other malignant neoplasm of large intestine: Secondary | ICD-10-CM | POA: Diagnosis not present

## 2023-02-17 ENCOUNTER — Inpatient Hospital Stay: Payer: Medicare HMO | Attending: Oncology

## 2023-02-17 ENCOUNTER — Inpatient Hospital Stay: Payer: Medicare HMO | Admitting: Oncology

## 2023-02-17 VITALS — BP 129/69 | HR 70 | Temp 98.1°F | Resp 18 | Ht 72.0 in | Wt 229.8 lb

## 2023-02-17 DIAGNOSIS — Z85038 Personal history of other malignant neoplasm of large intestine: Secondary | ICD-10-CM | POA: Insufficient documentation

## 2023-02-17 DIAGNOSIS — N401 Enlarged prostate with lower urinary tract symptoms: Secondary | ICD-10-CM | POA: Diagnosis not present

## 2023-02-17 DIAGNOSIS — I110 Hypertensive heart disease with heart failure: Secondary | ICD-10-CM | POA: Insufficient documentation

## 2023-02-17 DIAGNOSIS — Z08 Encounter for follow-up examination after completed treatment for malignant neoplasm: Secondary | ICD-10-CM | POA: Insufficient documentation

## 2023-02-17 DIAGNOSIS — I509 Heart failure, unspecified: Secondary | ICD-10-CM | POA: Insufficient documentation

## 2023-02-17 DIAGNOSIS — C184 Malignant neoplasm of transverse colon: Secondary | ICD-10-CM

## 2023-02-17 LAB — CEA (ACCESS): CEA (CHCC): 1.68 ng/mL (ref 0.00–5.00)

## 2023-02-17 NOTE — Progress Notes (Signed)
Robert Horn unsure of what medications he takes, saying his wife in charge of this. He will show her printed med list and have her send MyChart message with any necessary edits.

## 2023-02-17 NOTE — Progress Notes (Signed)
  [facility] OFFICE PROGRESS NOTE   Diagnosis: Colon cancer  INTERVAL HISTORY:   Robert Horn returns as scheduled.  He feels well.  Good appetite.  He is working.  No difficulty with bowel function.  No bleeding.  He has remittent urinary incontinence.  He reports he saw urology and was prescribed medication.  This did not help.  Objective:  Vital signs in last 24 hours:  Blood pressure 129/69, pulse 70, temperature 98.1 F (36.7 C), temperature source Temporal, resp. rate 18, height 6' (1.829 m), weight 229 lb 12.8 oz (104.2 kg), SpO2 99%.    Lymphatics: No cervical, supraclavicular, axillary, or inguinal nodes Resp: Lungs clear bilaterally Cardio: Regular rate and rhythm GI: Nontender, no hepatosplenomegaly, no mass Vascular: No leg edema   Lab Results:  Lab Results  Component Value Date   WBC 4.4 03/17/2021   HGB 13.9 03/17/2021   HCT 42.2 03/17/2021   MCV 90.8 03/17/2021   PLT PLATELET CLUMPS NOTED ON SMEAR, UNABLE TO ESTIMATE 03/17/2021   NEUTROABS 1.6 (L) 03/17/2021    CMP  Lab Results  Component Value Date   NA 142 08/11/2022   K 4.2 08/11/2022   CL 103 08/11/2022   CO2 25 08/11/2022   GLUCOSE 124 (H) 08/11/2022   BUN 24 08/11/2022   CREATININE 1.54 (H) 08/11/2022   CALCIUM 9.5 08/11/2022   PROT 7.5 03/16/2021   ALBUMIN 3.6 03/16/2021   AST 34 03/16/2021   ALT 20 03/16/2021   ALKPHOS 44 03/16/2021   BILITOT 0.8 03/16/2021   GFRNONAA 38 (L) 03/17/2021   GFRAA 49 (L) 08/02/2019    Lab Results  Component Value Date   CEA1 2.78 09/17/2020   CEA 1.82 07/16/2022    Medications: I have reviewed the patient's current medications.   Assessment/Plan: Colon cancer, transverse, stage II (T3N0), status post a right and transverse colectomy 12/15/2017 0/25 lymph nodes positive, no lymphovascular or perineural invasion MSI stable, no loss of mismatch repair protein expression CTs 12/10/2017- transverse colon wall thickening, 8 mm transverse  mesocolon lymph node, no evidence of metastatic disease, stable small bilateral pulmonary nodules Colonoscopy 12/09/2017-transverse colon mass, sessile polyps in the descending colon-not removed Colonoscopy 01/13/2019-no polyps, healthy appearing mucosa at the colocolonic anastomosis Polyps on the 12/15/2017 resection specimen- tubular adenomas Enlarged prostate with elevated PSA Status post TAVR 12/15/2016 Hypertension Pneumonia 1982 History of CHF Right first and second toe amputations secondary to osteomyelitis November 2020 in May 2021, right third metatarsal head ulcer/osteomyelitis followed by orthopedics     Disposition: Robert Horn remains in clinical remission from colon cancer.  We will follow-up on the CEA from today.  He reports Dr. Bosie Clos does not recommend a surveillance colonoscopy.  I recommended he follow-up with Dr. Nehemiah Settle or his urologist to evaluate the urinary incontinence.  Robert Horn is scheduled for foot ulcer surgery at [facility] later this week.  He is now 5 years out from the diagnosis of stage II colon cancer.  I offered to discharge him from the medical oncology clinic.  He would like to continue follow-up here every 6 months.    Thornton Papas, MD  02/17/2023  11:50 AM

## 2023-02-24 DIAGNOSIS — Z9049 Acquired absence of other specified parts of digestive tract: Secondary | ICD-10-CM | POA: Diagnosis not present

## 2023-02-24 DIAGNOSIS — T8789 Other complications of amputation stump: Secondary | ICD-10-CM | POA: Diagnosis not present

## 2023-02-24 DIAGNOSIS — I493 Ventricular premature depolarization: Secondary | ICD-10-CM | POA: Diagnosis not present

## 2023-02-24 DIAGNOSIS — E1122 Type 2 diabetes mellitus with diabetic chronic kidney disease: Secondary | ICD-10-CM | POA: Diagnosis not present

## 2023-02-24 DIAGNOSIS — Z7984 Long term (current) use of oral hypoglycemic drugs: Secondary | ICD-10-CM | POA: Diagnosis not present

## 2023-02-24 DIAGNOSIS — Z952 Presence of prosthetic heart valve: Secondary | ICD-10-CM | POA: Diagnosis not present

## 2023-02-24 DIAGNOSIS — Z85038 Personal history of other malignant neoplasm of large intestine: Secondary | ICD-10-CM | POA: Diagnosis not present

## 2023-02-24 DIAGNOSIS — Z87891 Personal history of nicotine dependence: Secondary | ICD-10-CM | POA: Diagnosis not present

## 2023-02-24 DIAGNOSIS — I5043 Acute on chronic combined systolic (congestive) and diastolic (congestive) heart failure: Secondary | ICD-10-CM | POA: Diagnosis not present

## 2023-02-24 DIAGNOSIS — M869 Osteomyelitis, unspecified: Secondary | ICD-10-CM | POA: Diagnosis not present

## 2023-02-24 DIAGNOSIS — Z89421 Acquired absence of other right toe(s): Secondary | ICD-10-CM | POA: Diagnosis not present

## 2023-02-24 DIAGNOSIS — Z8673 Personal history of transient ischemic attack (TIA), and cerebral infarction without residual deficits: Secondary | ICD-10-CM | POA: Diagnosis not present

## 2023-02-24 DIAGNOSIS — I491 Atrial premature depolarization: Secondary | ICD-10-CM | POA: Diagnosis not present

## 2023-02-24 DIAGNOSIS — M87874 Other osteonecrosis, right foot: Secondary | ICD-10-CM | POA: Diagnosis not present

## 2023-02-24 DIAGNOSIS — J45909 Unspecified asthma, uncomplicated: Secondary | ICD-10-CM | POA: Diagnosis not present

## 2023-02-24 DIAGNOSIS — L929 Granulomatous disorder of the skin and subcutaneous tissue, unspecified: Secondary | ICD-10-CM | POA: Diagnosis not present

## 2023-02-24 DIAGNOSIS — E11621 Type 2 diabetes mellitus with foot ulcer: Secondary | ICD-10-CM | POA: Diagnosis not present

## 2023-02-24 DIAGNOSIS — L97509 Non-pressure chronic ulcer of other part of unspecified foot with unspecified severity: Secondary | ICD-10-CM | POA: Diagnosis not present

## 2023-02-24 DIAGNOSIS — E7849 Other hyperlipidemia: Secondary | ICD-10-CM | POA: Diagnosis not present

## 2023-02-24 DIAGNOSIS — I130 Hypertensive heart and chronic kidney disease with heart failure and stage 1 through stage 4 chronic kidney disease, or unspecified chronic kidney disease: Secondary | ICD-10-CM | POA: Diagnosis not present

## 2023-02-24 DIAGNOSIS — R9431 Abnormal electrocardiogram [ECG] [EKG]: Secondary | ICD-10-CM | POA: Diagnosis not present

## 2023-02-24 DIAGNOSIS — I350 Nonrheumatic aortic (valve) stenosis: Secondary | ICD-10-CM | POA: Diagnosis not present

## 2023-02-24 DIAGNOSIS — I440 Atrioventricular block, first degree: Secondary | ICD-10-CM | POA: Diagnosis not present

## 2023-02-24 DIAGNOSIS — Z7982 Long term (current) use of aspirin: Secondary | ICD-10-CM | POA: Diagnosis not present

## 2023-02-24 DIAGNOSIS — E1169 Type 2 diabetes mellitus with other specified complication: Secondary | ICD-10-CM | POA: Diagnosis not present

## 2023-02-24 DIAGNOSIS — M898X7 Other specified disorders of bone, ankle and foot: Secondary | ICD-10-CM | POA: Diagnosis not present

## 2023-02-24 DIAGNOSIS — N183 Chronic kidney disease, stage 3 unspecified: Secondary | ICD-10-CM | POA: Diagnosis not present

## 2023-02-24 DIAGNOSIS — M898X9 Other specified disorders of bone, unspecified site: Secondary | ICD-10-CM | POA: Diagnosis not present

## 2023-02-24 DIAGNOSIS — L97412 Non-pressure chronic ulcer of right heel and midfoot with fat layer exposed: Secondary | ICD-10-CM | POA: Diagnosis not present

## 2023-02-28 ENCOUNTER — Encounter (HOSPITAL_COMMUNITY): Payer: Self-pay

## 2023-02-28 ENCOUNTER — Emergency Department (HOSPITAL_COMMUNITY): Payer: Medicare HMO

## 2023-02-28 ENCOUNTER — Emergency Department (HOSPITAL_COMMUNITY)
Admission: EM | Admit: 2023-02-28 | Discharge: 2023-02-28 | Disposition: A | Discharge status: Home / Self Care | Payer: Medicare HMO | Attending: Emergency Medicine | Admitting: Emergency Medicine

## 2023-02-28 ENCOUNTER — Other Ambulatory Visit: Payer: Self-pay

## 2023-02-28 DIAGNOSIS — I509 Heart failure, unspecified: Secondary | ICD-10-CM | POA: Diagnosis not present

## 2023-02-28 DIAGNOSIS — Z7984 Long term (current) use of oral hypoglycemic drugs: Secondary | ICD-10-CM | POA: Diagnosis not present

## 2023-02-28 DIAGNOSIS — E669 Obesity, unspecified: Secondary | ICD-10-CM | POA: Diagnosis not present

## 2023-02-28 DIAGNOSIS — I130 Hypertensive heart and chronic kidney disease with heart failure and stage 1 through stage 4 chronic kidney disease, or unspecified chronic kidney disease: Secondary | ICD-10-CM | POA: Diagnosis not present

## 2023-02-28 DIAGNOSIS — N183 Chronic kidney disease, stage 3 unspecified: Secondary | ICD-10-CM | POA: Diagnosis not present

## 2023-02-28 DIAGNOSIS — Z7982 Long term (current) use of aspirin: Secondary | ICD-10-CM | POA: Diagnosis not present

## 2023-02-28 DIAGNOSIS — Z79899 Other long term (current) drug therapy: Secondary | ICD-10-CM | POA: Diagnosis not present

## 2023-02-28 DIAGNOSIS — E1122 Type 2 diabetes mellitus with diabetic chronic kidney disease: Secondary | ICD-10-CM | POA: Diagnosis not present

## 2023-02-28 DIAGNOSIS — J45909 Unspecified asthma, uncomplicated: Secondary | ICD-10-CM | POA: Diagnosis not present

## 2023-02-28 DIAGNOSIS — Z85038 Personal history of other malignant neoplasm of large intestine: Secondary | ICD-10-CM | POA: Diagnosis not present

## 2023-02-28 DIAGNOSIS — Z1152 Encounter for screening for COVID-19: Secondary | ICD-10-CM | POA: Insufficient documentation

## 2023-02-28 DIAGNOSIS — Z6830 Body mass index (BMI) 30.0-30.9, adult: Secondary | ICD-10-CM | POA: Insufficient documentation

## 2023-02-28 DIAGNOSIS — R0600 Dyspnea, unspecified: Secondary | ICD-10-CM | POA: Diagnosis not present

## 2023-02-28 DIAGNOSIS — I517 Cardiomegaly: Secondary | ICD-10-CM | POA: Diagnosis not present

## 2023-02-28 DIAGNOSIS — I2510 Atherosclerotic heart disease of native coronary artery without angina pectoris: Secondary | ICD-10-CM | POA: Insufficient documentation

## 2023-02-28 DIAGNOSIS — R0989 Other specified symptoms and signs involving the circulatory and respiratory systems: Secondary | ICD-10-CM | POA: Diagnosis not present

## 2023-02-28 DIAGNOSIS — R0602 Shortness of breath: Secondary | ICD-10-CM | POA: Diagnosis not present

## 2023-02-28 LAB — TROPONIN I (HIGH SENSITIVITY)
Troponin I (High Sensitivity): 88 ng/L — ABNORMAL HIGH (ref <18)
Troponin I (High Sensitivity): 81 ng/L — ABNORMAL HIGH (ref <18)

## 2023-02-28 LAB — COMPREHENSIVE METABOLIC PANEL
ALT: 18 U/L (ref 0–44)
AST: 27 U/L (ref 15–41)
Albumin: 3.6 g/dL (ref 3.5–5.0)
Alkaline Phosphatase: 53 U/L (ref 38–126)
Anion gap: 10 (ref 5–15)
BUN: 20 mg/dL (ref 8–23)
CO2: 23 mmol/L (ref 22–32)
Calcium: 9 mg/dL (ref 8.9–10.3)
Chloride: 106 mmol/L (ref 98–111)
Creatinine, Ser: 1.46 mg/dL — ABNORMAL HIGH (ref 0.61–1.24)
GFR, Estimated: 46 mL/min — ABNORMAL LOW (ref >60)
Glucose, Bld: 118 mg/dL — ABNORMAL HIGH (ref 70–99)
Potassium: 3.8 mmol/L (ref 3.5–5.1)
Sodium: 139 mmol/L (ref 135–145)
Total Bilirubin: 0.9 mg/dL (ref <1.2)
Total Protein: 7.4 g/dL (ref 6.5–8.1)

## 2023-02-28 LAB — DIFFERENTIAL
Abs Immature Granulocytes: 0.01 10*3/uL (ref 0.00–0.07)
Basophils Absolute: 0 10*3/uL (ref 0.0–0.1)
Basophils Relative: 1 %
Eosinophils Absolute: 0.1 10*3/uL (ref 0.0–0.5)
Eosinophils Relative: 2 %
Immature Granulocytes: 0 %
Lymphocytes Relative: 20 %
Lymphs Abs: 1.6 10*3/uL (ref 0.7–4.0)
Monocytes Absolute: 1.4 10*3/uL — ABNORMAL HIGH (ref 0.1–1.0)
Monocytes Relative: 18 %
Neutro Abs: 4.5 10*3/uL (ref 1.7–7.7)
Neutrophils Relative %: 59 %

## 2023-02-28 LAB — RESP PANEL BY RT-PCR (RSV, FLU A&B, COVID)  RVPGX2
Influenza A by PCR: NEGATIVE
Influenza B by PCR: NEGATIVE
Resp Syncytial Virus by PCR: NEGATIVE
SARS Coronavirus 2 by RT PCR: NEGATIVE

## 2023-02-28 LAB — CBC
HCT: 41.6 % (ref 39.0–52.0)
Hemoglobin: 13.1 g/dL (ref 13.0–17.0)
MCH: 29.8 pg (ref 26.0–34.0)
MCHC: 31.5 g/dL (ref 30.0–36.0)
MCV: 94.5 fL (ref 80.0–100.0)
Platelets: 125 10*3/uL — ABNORMAL LOW (ref 150–400)
RBC: 4.4 MIL/uL (ref 4.22–5.81)
RDW: 13.8 % (ref 11.5–15.5)
WBC: 7.7 10*3/uL (ref 4.0–10.5)
nRBC: 0 % (ref 0.0–0.2)

## 2023-02-28 LAB — BRAIN NATRIURETIC PEPTIDE: B Natriuretic Peptide: 1471.3 pg/mL — ABNORMAL HIGH (ref 0.0–100.0)

## 2023-02-28 MED ORDER — FUROSEMIDE 10 MG/ML IJ SOLN
40.0000 mg | Freq: Once | INTRAMUSCULAR | Status: AC
  Administered 2023-02-28: 40 mg via INTRAVENOUS
  Filled 2023-02-28: qty 4

## 2023-02-28 NOTE — Discharge Instructions (Signed)
It was a pleasure caring for you today in the emergency department.  Continuing taking your Lasix as prescribed, continue fluid restriction  Please return to the emergency department for any worsening or worrisome symptoms.

## 2023-02-28 NOTE — ED Triage Notes (Deleted)
Patient reports she missed several doses of her levothyroxine. She took one dose yesterday and felt that her throat had swollen. Patient reports difficulty swallowing, SOB, nausea, and dizziness. Patient able to speak in full sentences, airway is patent.

## 2023-02-28 NOTE — ED Notes (Signed)
Pt walked around room while monitoring pulse ox, o2 stats were between 91-94, Pulse 70-73 pt stated he was no longer dizzy or SOB.

## 2023-02-28 NOTE — ED Triage Notes (Signed)
Patient reports SOB x 1.5 hours. Wife took BP at home and it was elevated 149/94. Patient has hx of CHF. Patient recently had surgery and was off his lasix for one week and was recently started back on them. Has not had his lasix this AM.  VSS and NAD.

## 2023-02-28 NOTE — ED Provider Notes (Signed)
[place] EMERGENCY DEPARTMENT AT [facility] Provider Note  CSN: [id number] Arrival date & time: 02/28/23 8295  Chief Complaint(s) Shortness of Breath  HPI Robert Horn is a 89 y.o. male with past medical history as below, significant for CHF, CKD, CAD, DM, HLD, HTN, PVD who presents to the ED with complaint of dyspnea  Recent surgical intervention at [facility], right foot partial amputation.  Per team at [facility] they have been holding his Lasix, was restarted around 3 to 4 days ago.   Patient reports difficulty breathing past 24 hours, worse with exertion.  No unexpected weight changes, no fevers or chest pain, no cough, no URI symptoms.  No significant swelling to his lower extremities.  Feels his right foot wound is healing well and is scheduled to see follow-up with surgeon tomorrow.  He has not had his Lasix this morning but has had the past 3 days per his spouse.  Spouse is also concerned that his blood pressure is elevated, he was previously on amlodipine but taken off secondary to feeling lightheaded, hypotensive episodes per patient  Past Medical History Past Medical History:  Diagnosis Date   Anemia    Anxiety    situational- surgery   Asthma    as a child   Cancer (HCC) 2019   colon- colectomy    CHF (congestive heart failure) (HCC)    Chronic kidney disease    followed by Dr. Nehemiah Settle   Coronary artery disease    Diabetes mellitus without complication (HCC)    Type II   Dyslipidemia 10/27/2015   Dyspnea    w/ exertion    Elevated PSA 10/27/2015   Erectile dysfunction 10/27/2015   Heart murmur    Hypertension 10/27/2015   Hypogonadism male 10/27/2015   Neuropathy    Obesity 10/27/2015   Peripheral vascular disease (HCC)    Pneumonia 10/27/2015   pt states was 1982   Rotator cuff tear 10/27/2015   Patient Active Problem List   Diagnosis Date Noted   COVID-19 virus infection 03/16/2021   TIA (transient ischemic attack) 03/12/2021   Diabetes mellitus type  2, controlled (HCC) 03/12/2021   Right foot ulcer, limited to breakdown of skin (HCC) 02/28/2020   Osteomyelitis of second toe of right foot (HCC)    Osteomyelitis of great toe of right foot (HCC)    Severe protein-calorie malnutrition (HCC)    Diabetic polyneuropathy associated with type 2 diabetes mellitus (HCC)    Cutaneous abscess of right foot    Cellulitis of right foot 01/30/2019   CKD (chronic kidney disease), stage III (HCC) 01/30/2019   Elevated LFTs 01/30/2019   Colon cancer (HCC) 01/30/2019   Anemia of chronic disease 01/30/2019   Acute on chronic combined systolic and diastolic CHF (congestive heart failure) (HCC) 03/09/2018   AKI (acute kidney injury) (HCC) 03/09/2018   Colonic mass 12/10/2017   S/P TAVR (transcatheter aortic valve replacement) 03/03/2017   Severe aortic stenosis 12/15/2016   Essential hypertension 05/09/2013   Hyperlipidemia 05/09/2013   PVC's (premature ventricular contractions) 05/09/2013   Chronic combined systolic and diastolic heart failure (HCC) 05/09/2013   Home Medication(s) Prior to Admission medications   Medication Sig Start Date End Date Taking? Authorizing Provider  amLODipine (NORVASC) 10 MG tablet Take 1 tablet (10 mg total) by mouth Horn. 03/11/22   Lyn Records, MD  aspirin 81 MG EC tablet Take 81 mg by mouth Horn. Swallow whole. Patient not taking: Reported on 02/17/2023    [provider]  carvedilol (COREG) 3.125 MG tablet Take 1 tablet (3.125 mg total) by mouth 2 (two) times Horn with a meal. 03/11/22   Lyn Records, MD  Cholecalciferol (VITAMIN D) 50 MCG (2000 UT) tablet Take 2,000 Units by mouth Horn.    [provider]  clopidogrel (PLAVIX) 75 MG tablet Take 1 tablet (75 mg total) by mouth Horn. 03/11/22   Lyn Records, MD  diclofenac Sodium (VOLTAREN) 1 % GEL Apply 4 g topically as needed. 03/03/22   [provider]  ezetimibe (ZETIA) 10 MG tablet Take 1 tablet (10 mg total) by mouth Horn.  08/11/22 02/17/23  Corky Crafts, MD  ferrous sulfate 325 (65 FE) MG tablet Take 325 mg by mouth Horn with breakfast.    [provider]  furosemide (LASIX) 40 MG tablet TAKE 1.5 TABLETS BY MOUTH Horn. 03/11/22   Lyn Records, MD  JARDIANCE 25 MG TABS tablet Take 25 mg by mouth Horn. 12/06/19   [provider]  Multiple Vitamin (MULTIVITAMIN WITH MINERALS) TABS tablet Take 1 tablet by mouth Horn. 10/27/15   Barnetta Chapel, MD  omega-3 acid ethyl esters (LOVAZA) 1 g capsule Take 1 capsule (1 g total) by mouth Horn. 06/11/22   Corky Crafts, MD  oxyCODONE-acetaminophen (PERCOCET/ROXICET) 5-325 MG tablet Take 1 tablet by mouth every 4 (four) hours as needed for severe pain. Patient not taking: Reported on 02/17/2023 05/26/22   Nadara Mustard, MD  rosuvastatin (CRESTOR) 40 MG tablet Take 1 tablet (40 mg total) by mouth Horn. 05/15/21   Ihor Austin, NP  sacubitril-valsartan (ENTRESTO) 49-51 MG Take 1 tablet by mouth 2 (two) times Horn. Please attend scheduled appointment for additional refills. 03/11/22   Lyn Records, MD  Semaglutide,0.25 or 0.5MG /DOS, (OZEMPIC, 0.25 OR 0.5 MG/DOSE,) 2 MG/1.5ML SOPN Inject 0.5 mg into the skin once a week.    [provider]  tiZANidine (ZANAFLEX) 2 MG tablet 2 mg 3 (three) times Horn as needed for muscle spasms. 03/03/22   [provider]  VITAMIN B COMPLEX-C PO Take 1 tablet by mouth Horn.    [provider]  Wound Dressings (HYDROFERA BLUE 4"X4") PADS Apply 1 Application topically Horn. 03/17/22   Adonis Huguenin, NP                                                                                                                                    Past Surgical History Past Surgical History:  Procedure Laterality Date   ABDOMINAL AORTOGRAM N/A 02/01/2019   Procedure: ABDOMINAL AORTOGRAM;  Surgeon: Cephus Shelling, MD;  Location: [facility] INVASIVE CV LAB;  Service: Cardiovascular;  Laterality: N/A;    AMPUTATION Right 02/03/2019   Procedure: RIGHT FOOT 1ST RAY AMPUTATION;  Surgeon: Nadara Mustard, MD;  Location: [facility] OR;  Service: Orthopedics;  Laterality: Right;   AMPUTATION Right 08/02/2019   Procedure: RIGHT 2ND TOE AMPUTATION;  Surgeon: Lajoyce Corners,  Randa Evens, MD;  Location: [facility] OR;  Service: Orthopedics;  Laterality: Right;   AMPUTATION Right 11/08/2020   Procedure: RIGHT TRANSMETATARSAL AMPUTATION;  Surgeon: Nadara Mustard, MD;  Location: [facility] OR;  Service: Orthopedics;  Laterality: Right;   APPLICATION OF WOUND VAC  11/08/2020   Procedure: APPLICATION OF WOUND VAC;  Surgeon: Nadara Mustard, MD;  Location: [facility] OR;  Service: Orthopedics;;   CARDIAC CATHETERIZATION N/A 11/14/2015   Procedure: Left Heart Cath and Coronary Angiography;  Surgeon: Lyn Records, MD;  Location: [facility] INVASIVE CV LAB;  Service: Cardiovascular;  Laterality: N/A;   COLONOSCOPY     LAPAROSCOPIC PARTIAL COLECTOMY  12/15/2017   LAPAROSCOPIC PARTIAL COLECTOMY (N/A Abdomen)   LAPAROSCOPIC PARTIAL COLECTOMY N/A 12/15/2017   Procedure: LAPAROSCOPIC PARTIAL COLECTOMY;  Surgeon: Harriette Bouillon, MD;  Location: [facility] OR;  Service: General;  Laterality: N/A;   LOWER EXTREMITY ANGIOGRAPHY Right 02/01/2019   Procedure: LOWER EXTREMITY ANGIOGRAPHY;  Surgeon: Cephus Shelling, MD;  Location: [facility] INVASIVE CV LAB;  Service: Cardiovascular;  Laterality: Right;   PERIPHERAL VASCULAR INTERVENTION Right 02/01/2019   Procedure: PERIPHERAL VASCULAR INTERVENTION;  Surgeon: Cephus Shelling, MD;  Location: [facility] INVASIVE CV LAB;  Service: Cardiovascular;  Laterality: Right;  SFA   TEE WITHOUT CARDIOVERSION N/A 12/15/2016   Procedure: TRANSESOPHAGEAL ECHOCARDIOGRAM (TEE);  Surgeon: Tonny Bollman, MD;  Location: [facility] OR;  Service: Open Heart Surgery;  Laterality: N/A;   TRANSCATHETER AORTIC VALVE REPLACEMENT, TRANSFEMORAL N/A 12/15/2016   Procedure: TRANSCATHETER AORTIC VALVE REPLACEMENT, TRANSFEMORAL;  Surgeon: Tonny Bollman, MD;  Location: [facility] OR;  Service:  Open Heart Surgery;  Laterality: N/A;   Family History Family History  Problem Relation Age of Onset   Diabetes Mother    Heart disease Mother    Pulmonary embolism Father     Social History Social History   Tobacco Use   Smoking status: Former    Types: Cigarettes    Passive exposure: Never   Smokeless tobacco: Never  Vaping Use   Vaping status: Never Used  Substance Use Topics   Alcohol use: Yes    Comment: rarely   Drug use: No   Allergies Ticagrelor  Review of Systems Review of Systems  Constitutional:  Negative for chills and fever.  Respiratory:  Positive for shortness of breath. Negative for chest tightness.   Cardiovascular:  Negative for chest pain, palpitations and leg swelling.  Gastrointestinal:  Negative for abdominal pain, diarrhea, nausea and vomiting.  Genitourinary:  Negative for difficulty urinating.  Musculoskeletal:  Negative for arthralgias.  Neurological:  Negative for syncope.    Physical Exam Vital Signs  I have reviewed the triage vital signs BP (!) 155/111   Pulse 73   Temp 97.9 F (36.6 C) (Oral)   Resp 12   Ht 6' (1.829 m)   Wt 101.6 kg   SpO2 97%   BMI 30.38 kg/m  Physical Exam Vitals and nursing note reviewed.  Constitutional:      General: He is not in acute distress.    Appearance: He is well-developed. He is obese.  HENT:     Head: Normocephalic and atraumatic.     Right Ear: External ear normal.     Left Ear: External ear normal.     Mouth/Throat:     Mouth: Mucous membranes are moist.  Eyes:     General: No scleral icterus. Cardiovascular:     Rate and Rhythm: Normal rate and regular rhythm.     Pulses: Normal pulses.  Heart sounds: Normal heart sounds.  Pulmonary:     Effort: Pulmonary effort is normal. No respiratory distress.     Breath sounds: Decreased breath sounds present.  Abdominal:     General: Abdomen is flat.     Palpations: Abdomen is soft.     Tenderness: There is no abdominal tenderness.   Musculoskeletal:     Cervical back: No rigidity.     Right lower leg: No edema.     Left lower leg: No edema.     Comments: Right foot partial amputation, dressing clean dry intact.  No significant LE edema    Skin:    General: Skin is warm and dry.     Capillary Refill: Capillary refill takes less than 2 seconds.  Neurological:     Mental Status: He is alert.  Psychiatric:        Mood and Affect: Mood normal.        Behavior: Behavior normal.     ED Results and Treatments Labs (all labs ordered are listed, but only abnormal results are displayed) Labs Reviewed  CBC - Abnormal; Notable for the following components:      Result Value   Platelets 125 (*)    All other components within normal limits  COMPREHENSIVE METABOLIC PANEL - Abnormal; Notable for the following components:   Glucose, Bld 118 (*)    Creatinine, Ser 1.46 (*)    GFR, Estimated 46 (*)    All other components within normal limits  BRAIN NATRIURETIC PEPTIDE - Abnormal; Notable for the following components:   B Natriuretic Peptide 1,471.3 (*)    All other components within normal limits  DIFFERENTIAL - Abnormal; Notable for the following components:   Monocytes Absolute 1.4 (*)    All other components within normal limits  TROPONIN I (HIGH SENSITIVITY) - Abnormal; Notable for the following components:   Troponin I (High Sensitivity) 88 (*)    All other components within normal limits  TROPONIN I (HIGH SENSITIVITY) - Abnormal; Notable for the following components:   Troponin I (High Sensitivity) 81 (*)    All other components within normal limits  RESP PANEL BY RT-PCR (RSV, FLU A&B, COVID)  RVPGX2                                                                                                                          Radiology No results found.  Pertinent labs & imaging results that were available during my care of the patient were reviewed by me and considered in my medical decision making (see MDM for  details).  Medications Ordered in ED Medications  furosemide (LASIX) injection 40 mg (40 mg Intravenous Given 02/28/23 1113)  Procedures .Critical Care  Performed by: Sloan Leiter, DO Authorized by: Sloan Leiter, DO   Critical care provider statement:    Critical care time (minutes):  30   Critical care time was exclusive of:  Separately billable procedures and treating other patients   Critical care was necessary to treat or prevent imminent or life-threatening deterioration of the following conditions:  Cardiac failure   Critical care was time spent personally by me on the following activities:  Development of treatment plan with patient or surrogate, discussions with consultants, evaluation of patient's response to treatment, examination of patient, ordering and review of laboratory studies, ordering and review of radiographic studies, ordering and performing treatments and interventions, pulse oximetry, re-evaluation of patient's condition, review of old charts and obtaining history from patient or surrogate   (including critical care time)  Medical Decision Making / ED Course    Medical Decision Making:    Robert Horn is a 89 y.o. male with past medical history as below, significant for CHF, CKD, CAD, DM, HLD, HTN, PVD who presents to the ED with complaint of dyspnea. The complaint involves an extensive differential diagnosis and also carries with it a high risk of complications and morbidity.  Serious etiology was considered. Ddx includes but is not limited to: In my evaluation of this patient's dyspnea my DDx includes, but is not limited to, pneumonia, pulmonary embolism, pneumothorax, pulmonary edema, metabolic acidosis, asthma, COPD, cardiac cause, anemia, anxiety, etc.    Complete initial physical exam performed, notably the patient was in no  acute distress, breathing comfortably on ambient air.    Reviewed and confirmed nursing documentation for past medical history, family history, social history.  Vital signs reviewed.    Difficulty breathing this morning, mildly elevated blood pressure.  Check screening labs, get x-ray.   Clinical Course as of 03/01/23 1632  Sun Feb 28, 2023  1027 B Natriuretic Peptide(!): 1,471.3 Will give Lasix, check pulse ox with ambulation [SG]  1115 Creatinine(!): 1.46 Similar to prior [SG]  1115 Troponin I (High Sensitivity)(!): 88 He has no chest pain, troponin mildly increased from prior values [SG]  1115 DG Chest Portable 1 View Chest x-ray without large volume pleural effusion or obvious infiltrate [SG]  1317 Troponin I (High Sensitivity)(!): 81 Rpt flat, down trending  [SG]  1328 He is feeling better, he had significant urine output after diuresis.  No hypoxia [SG]  1416 Feeling better  [SG]  1442 Observation offered/recommended, pt prefers to go home and f/u with pcp in the office  He has no hypoxia, no distress, overall is well appearing. [SG]    Clinical Course User Index [SG] Sloan Leiter, DO    Brief summary: 89 year old male history of CHF here with dyspnea, apparent volume overload.  Labs as above, concerning for CHF exacerbation.  No hypoxia.  Chest x-ray stable.  He has diuresed appropriately in the ER.  Elevated troponin favored to be demand ischemia in setting of acute CHF.  Chronically elevated troponin.  Admit/obs was recommended for further diuresis for presumed CHF exacerbation.  He prefers to continue diuresis at home and will follow-up with his PCP in the next few days for recheck.  Spouse will monitor closely, monitor his fluid intake.  Monitor his weights.  Advised to return if symptoms worsen  The patient improved significantly and was discharged in stable condition. Detailed discussions were had with the patient regarding current findings, and need for close f/u with PCP  or on call  doctor. The patient has been instructed to return immediately if the symptoms worsen in any way for re-evaluation. Patient verbalized understanding and is in agreement with current care plan. All questions answered prior to discharge.                  Additional history obtained: -Additional history obtained from spouse -External records from outside source obtained and reviewed including: Chart review including previous notes, labs, imaging, consultation notes including  Prior labs and imaging Home medications Surgery documentation from OSH   Lab Tests: -I ordered, reviewed, and interpreted labs.   The pertinent results include:   Labs Reviewed  CBC - Abnormal; Notable for the following components:      Result Value   Platelets 125 (*)    All other components within normal limits  COMPREHENSIVE METABOLIC PANEL - Abnormal; Notable for the following components:   Glucose, Bld 118 (*)    Creatinine, Ser 1.46 (*)    GFR, Estimated 46 (*)    All other components within normal limits  BRAIN NATRIURETIC PEPTIDE - Abnormal; Notable for the following components:   B Natriuretic Peptide 1,471.3 (*)    All other components within normal limits  DIFFERENTIAL - Abnormal; Notable for the following components:   Monocytes Absolute 1.4 (*)    All other components within normal limits  TROPONIN I (HIGH SENSITIVITY) - Abnormal; Notable for the following components:   Troponin I (High Sensitivity) 88 (*)    All other components within normal limits  TROPONIN I (HIGH SENSITIVITY) - Abnormal; Notable for the following components:   Troponin I (High Sensitivity) 81 (*)    All other components within normal limits  RESP PANEL BY RT-PCR (RSV, FLU A&B, COVID)  RVPGX2    Notable for as above, elevated BNP and troponin  EKG   EKG Interpretation Date/Time:    Ventricular Rate:    PR Interval:    QRS Duration:    QT Interval:    QTC Calculation:   R Axis:      Text  Interpretation:           Imaging Studies ordered: I ordered imaging studies including chest x-ray I independently visualized the following imaging with scope of interpretation limited to determining acute life threatening conditions related to emergency care; findings noted above I independently visualized and interpreted imaging. I agree with the radiologist interpretation   Medicines ordered and prescription drug management: Meds ordered this encounter  Medications   furosemide (LASIX) injection 40 mg    -I have reviewed the patients home medicines and have made adjustments as needed   Consultations Obtained: na   Cardiac Monitoring: The patient was maintained on a cardiac monitor.  I personally viewed and interpreted the cardiac monitored which showed an underlying rhythm of: NSR Continuous pulse oximetry interpreted by myself, 99% on RA.    Social Determinants of Health:  Diagnosis or treatment significantly limited by social determinants of health: former smoker and obesity   Reevaluation: After the interventions noted above, I reevaluated the patient and found that they have improved  Co morbidities that complicate the patient evaluation  Past Medical History:  Diagnosis Date   Anemia    Anxiety    situational- surgery   Asthma    as a child   Cancer (HCC) 2019   colon- colectomy    CHF (congestive heart failure) (HCC)    Chronic kidney disease    followed by Dr. Nehemiah Settle   Coronary artery  disease    Diabetes mellitus without complication (HCC)    Type II   Dyslipidemia 10/27/2015   Dyspnea    w/ exertion    Elevated PSA 10/27/2015   Erectile dysfunction 10/27/2015   Heart murmur    Hypertension 10/27/2015   Hypogonadism male 10/27/2015   Neuropathy    Obesity 10/27/2015   Peripheral vascular disease (HCC)    Pneumonia 10/27/2015   pt states was 1982   Rotator cuff tear 10/27/2015      Dispostion: Disposition decision including need for  hospitalization was considered, and patient discharged from emergency department.    Final Clinical Impression(s) / ED Diagnoses Final diagnoses:  Acute congestive heart failure, unspecified heart failure type (HCC)        Sloan Leiter, DO 03/01/23 1632

## 2023-03-01 DIAGNOSIS — N183 Chronic kidney disease, stage 3 unspecified: Secondary | ICD-10-CM | POA: Diagnosis not present

## 2023-03-01 DIAGNOSIS — E11621 Type 2 diabetes mellitus with foot ulcer: Secondary | ICD-10-CM | POA: Diagnosis not present

## 2023-03-01 DIAGNOSIS — L97509 Non-pressure chronic ulcer of other part of unspecified foot with unspecified severity: Secondary | ICD-10-CM | POA: Diagnosis not present

## 2023-03-01 DIAGNOSIS — L97412 Non-pressure chronic ulcer of right heel and midfoot with fat layer exposed: Secondary | ICD-10-CM | POA: Diagnosis not present

## 2023-03-09 ENCOUNTER — Inpatient Hospital Stay (HOSPITAL_COMMUNITY)
Admission: EM | Admit: 2023-03-09 | Discharge: 2023-03-12 | DRG: 280 | Disposition: A | Discharge status: Home / Self Care | Payer: Medicare HMO | Attending: Internal Medicine | Admitting: Internal Medicine

## 2023-03-09 ENCOUNTER — Encounter (HOSPITAL_COMMUNITY): Payer: Self-pay

## 2023-03-09 ENCOUNTER — Other Ambulatory Visit: Payer: Self-pay

## 2023-03-09 ENCOUNTER — Emergency Department (HOSPITAL_BASED_OUTPATIENT_CLINIC_OR_DEPARTMENT_OTHER): Payer: Medicare HMO

## 2023-03-09 ENCOUNTER — Emergency Department (HOSPITAL_COMMUNITY): Payer: Medicare HMO

## 2023-03-09 DIAGNOSIS — Z9889 Other specified postprocedural states: Secondary | ICD-10-CM

## 2023-03-09 DIAGNOSIS — E1140 Type 2 diabetes mellitus with diabetic neuropathy, unspecified: Secondary | ICD-10-CM | POA: Diagnosis present

## 2023-03-09 DIAGNOSIS — I509 Heart failure, unspecified: Secondary | ICD-10-CM | POA: Diagnosis not present

## 2023-03-09 DIAGNOSIS — Z8249 Family history of ischemic heart disease and other diseases of the circulatory system: Secondary | ICD-10-CM

## 2023-03-09 DIAGNOSIS — Z888 Allergy status to other drugs, medicaments and biological substances status: Secondary | ICD-10-CM

## 2023-03-09 DIAGNOSIS — I130 Hypertensive heart and chronic kidney disease with heart failure and stage 1 through stage 4 chronic kidney disease, or unspecified chronic kidney disease: Principal | ICD-10-CM | POA: Diagnosis present

## 2023-03-09 DIAGNOSIS — I5021 Acute systolic (congestive) heart failure: Secondary | ICD-10-CM

## 2023-03-09 DIAGNOSIS — E1122 Type 2 diabetes mellitus with diabetic chronic kidney disease: Secondary | ICD-10-CM | POA: Diagnosis present

## 2023-03-09 DIAGNOSIS — Z006 Encounter for examination for normal comparison and control in clinical research program: Secondary | ICD-10-CM

## 2023-03-09 DIAGNOSIS — E785 Hyperlipidemia, unspecified: Secondary | ICD-10-CM | POA: Diagnosis present

## 2023-03-09 DIAGNOSIS — Z8673 Personal history of transient ischemic attack (TIA), and cerebral infarction without residual deficits: Secondary | ICD-10-CM

## 2023-03-09 DIAGNOSIS — Z832 Family history of diseases of the blood and blood-forming organs and certain disorders involving the immune mechanism: Secondary | ICD-10-CM

## 2023-03-09 DIAGNOSIS — I493 Ventricular premature depolarization: Secondary | ICD-10-CM | POA: Diagnosis present

## 2023-03-09 DIAGNOSIS — I272 Pulmonary hypertension, unspecified: Secondary | ICD-10-CM | POA: Diagnosis present

## 2023-03-09 DIAGNOSIS — Z89431 Acquired absence of right foot: Secondary | ICD-10-CM

## 2023-03-09 DIAGNOSIS — I440 Atrioventricular block, first degree: Secondary | ICD-10-CM | POA: Diagnosis present

## 2023-03-09 DIAGNOSIS — E1151 Type 2 diabetes mellitus with diabetic peripheral angiopathy without gangrene: Secondary | ICD-10-CM | POA: Diagnosis present

## 2023-03-09 DIAGNOSIS — I21A1 Myocardial infarction type 2: Secondary | ICD-10-CM | POA: Diagnosis present

## 2023-03-09 DIAGNOSIS — Z79899 Other long term (current) drug therapy: Secondary | ICD-10-CM

## 2023-03-09 DIAGNOSIS — R918 Other nonspecific abnormal finding of lung field: Secondary | ICD-10-CM | POA: Diagnosis not present

## 2023-03-09 DIAGNOSIS — R0989 Other specified symptoms and signs involving the circulatory and respiratory systems: Secondary | ICD-10-CM | POA: Diagnosis not present

## 2023-03-09 DIAGNOSIS — I428 Other cardiomyopathies: Secondary | ICD-10-CM | POA: Diagnosis present

## 2023-03-09 DIAGNOSIS — Z8701 Personal history of pneumonia (recurrent): Secondary | ICD-10-CM

## 2023-03-09 DIAGNOSIS — R7989 Other specified abnormal findings of blood chemistry: Secondary | ICD-10-CM | POA: Diagnosis present

## 2023-03-09 DIAGNOSIS — I10 Essential (primary) hypertension: Secondary | ICD-10-CM | POA: Diagnosis not present

## 2023-03-09 DIAGNOSIS — Z89411 Acquired absence of right great toe: Secondary | ICD-10-CM

## 2023-03-09 DIAGNOSIS — R457 State of emotional shock and stress, unspecified: Secondary | ICD-10-CM | POA: Diagnosis not present

## 2023-03-09 DIAGNOSIS — I5043 Acute on chronic combined systolic (congestive) and diastolic (congestive) heart failure: Principal | ICD-10-CM | POA: Diagnosis present

## 2023-03-09 DIAGNOSIS — Z952 Presence of prosthetic heart valve: Secondary | ICD-10-CM

## 2023-03-09 DIAGNOSIS — I2510 Atherosclerotic heart disease of native coronary artery without angina pectoris: Secondary | ICD-10-CM | POA: Diagnosis present

## 2023-03-09 DIAGNOSIS — J45909 Unspecified asthma, uncomplicated: Secondary | ICD-10-CM | POA: Diagnosis present

## 2023-03-09 DIAGNOSIS — M79605 Pain in left leg: Secondary | ICD-10-CM | POA: Diagnosis not present

## 2023-03-09 DIAGNOSIS — Z85038 Personal history of other malignant neoplasm of large intestine: Secondary | ICD-10-CM

## 2023-03-09 DIAGNOSIS — I350 Nonrheumatic aortic (valve) stenosis: Secondary | ICD-10-CM | POA: Diagnosis present

## 2023-03-09 DIAGNOSIS — Z7902 Long term (current) use of antithrombotics/antiplatelets: Secondary | ICD-10-CM

## 2023-03-09 DIAGNOSIS — R9431 Abnormal electrocardiogram [ECG] [EKG]: Secondary | ICD-10-CM | POA: Diagnosis not present

## 2023-03-09 DIAGNOSIS — Z9049 Acquired absence of other specified parts of digestive tract: Secondary | ICD-10-CM

## 2023-03-09 DIAGNOSIS — Z23 Encounter for immunization: Secondary | ICD-10-CM

## 2023-03-09 DIAGNOSIS — Z7984 Long term (current) use of oral hypoglycemic drugs: Secondary | ICD-10-CM

## 2023-03-09 DIAGNOSIS — Q245 Malformation of coronary vessels: Secondary | ICD-10-CM

## 2023-03-09 DIAGNOSIS — Z7985 Long-term (current) use of injectable non-insulin antidiabetic drugs: Secondary | ICD-10-CM

## 2023-03-09 DIAGNOSIS — Z833 Family history of diabetes mellitus: Secondary | ICD-10-CM

## 2023-03-09 DIAGNOSIS — I700 Atherosclerosis of aorta: Secondary | ICD-10-CM | POA: Diagnosis not present

## 2023-03-09 DIAGNOSIS — N1832 Chronic kidney disease, stage 3b: Secondary | ICD-10-CM | POA: Diagnosis present

## 2023-03-09 DIAGNOSIS — Z7982 Long term (current) use of aspirin: Secondary | ICD-10-CM

## 2023-03-09 DIAGNOSIS — Z89421 Acquired absence of other right toe(s): Secondary | ICD-10-CM

## 2023-03-09 DIAGNOSIS — Z87891 Personal history of nicotine dependence: Secondary | ICD-10-CM

## 2023-03-09 LAB — CBC
HCT: 40.6 % (ref 39.0–52.0)
Hemoglobin: 12.8 g/dL — ABNORMAL LOW (ref 13.0–17.0)
MCH: 29.4 pg (ref 26.0–34.0)
MCHC: 31.5 g/dL (ref 30.0–36.0)
MCV: 93.1 fL (ref 80.0–100.0)
Platelets: 177 10*3/uL (ref 150–400)
RBC: 4.36 MIL/uL (ref 4.22–5.81)
RDW: 13.5 % (ref 11.5–15.5)
WBC: 7.5 10*3/uL (ref 4.0–10.5)
nRBC: 0 % (ref 0.0–0.2)

## 2023-03-09 LAB — TROPONIN I (HIGH SENSITIVITY)
Troponin I (High Sensitivity): 55 ng/L — ABNORMAL HIGH (ref <18)
Troponin I (High Sensitivity): 118 ng/L (ref <18)

## 2023-03-09 LAB — BASIC METABOLIC PANEL
Anion gap: 10 (ref 5–15)
BUN: 22 mg/dL (ref 8–23)
CO2: 24 mmol/L (ref 22–32)
Calcium: 9 mg/dL (ref 8.9–10.3)
Chloride: 106 mmol/L (ref 98–111)
Creatinine, Ser: 1.61 mg/dL — ABNORMAL HIGH (ref 0.61–1.24)
GFR, Estimated: 41 mL/min — ABNORMAL LOW (ref >60)
Glucose, Bld: 125 mg/dL — ABNORMAL HIGH (ref 70–99)
Potassium: 3.6 mmol/L (ref 3.5–5.1)
Sodium: 140 mmol/L (ref 135–145)

## 2023-03-09 LAB — CBG MONITORING, ED
Glucose-Capillary: 126 mg/dL — ABNORMAL HIGH (ref 70–99)
Glucose-Capillary: 126 mg/dL — ABNORMAL HIGH (ref 70–99)
Glucose-Capillary: 118 mg/dL — ABNORMAL HIGH (ref 70–99)

## 2023-03-09 LAB — ECHOCARDIOGRAM COMPLETE
AR max vel: 1.55 cm2
AV Area VTI: 1.5 cm2
AV Area mean vel: 1.48 cm2
AV Mean grad: 14.3 mm[Hg]
AV Peak grad: 26.6 mm[Hg]
Ao pk vel: 2.58 m/s
Area-P 1/2: 6.63 cm2
Calc EF: 31.2 %
Height: 72 in
S' Lateral: 5 cm
Single Plane A2C EF: 32.3 %
Single Plane A4C EF: 27.6 %
Weight: 3520 [oz_av]

## 2023-03-09 LAB — BRAIN NATRIURETIC PEPTIDE: B Natriuretic Peptide: 612.7 pg/mL — ABNORMAL HIGH (ref 0.0–100.0)

## 2023-03-09 MED ORDER — ALBUTEROL SULFATE (2.5 MG/3ML) 0.083% IN NEBU
2.5000 mg | INHALATION_SOLUTION | Freq: Four times a day (QID) | RESPIRATORY_TRACT | Status: DC | PRN
Start: 2023-03-09 — End: 2023-03-12

## 2023-03-09 MED ORDER — ACETAMINOPHEN 650 MG RE SUPP: 650.0000 mg | Freq: Four times a day (QID) | RECTAL | Status: DC | PRN

## 2023-03-09 MED ORDER — SACUBITRIL-VALSARTAN 49-51 MG PO TABS
1.0000 | ORAL_TABLET | Freq: Two times a day (BID) | ORAL | Status: DC
  Administered 2023-03-09 – 2023-03-12 (×6): 1 via ORAL
  Filled 2023-03-09 (×6): qty 1

## 2023-03-09 MED ORDER — INSULIN ASPART 100 UNIT/ML IJ SOLN: 0.0000 [IU] | Freq: Every day | INTRAMUSCULAR | Status: DC

## 2023-03-09 MED ORDER — ENOXAPARIN SODIUM 40 MG/0.4ML IJ SOSY
40.0000 mg | PREFILLED_SYRINGE | INTRAMUSCULAR | Status: DC
  Administered 2023-03-10: 40 mg via SUBCUTANEOUS
  Filled 2023-03-09: qty 0.4

## 2023-03-09 MED ORDER — POLYETHYLENE GLYCOL 3350 17 G PO PACK: 17.0000 g | PACK | Freq: Every day | ORAL | Status: DC | PRN

## 2023-03-09 MED ORDER — SENNA 8.6 MG PO TABS
1.0000 | ORAL_TABLET | Freq: Two times a day (BID) | ORAL | Status: DC
Start: 2023-03-09 — End: 2023-03-12
  Administered 2023-03-12: 8.6 mg via ORAL
  Filled 2023-03-09 (×4): qty 1

## 2023-03-09 MED ORDER — CARVEDILOL 3.125 MG PO TABS
3.1250 mg | ORAL_TABLET | Freq: Two times a day (BID) | ORAL | Status: DC
  Administered 2023-03-09 – 2023-03-12 (×6): 3.125 mg via ORAL
  Filled 2023-03-09 (×6): qty 1

## 2023-03-09 MED ORDER — INSULIN ASPART 100 UNIT/ML IJ SOLN
0.0000 [IU] | Freq: Three times a day (TID) | INTRAMUSCULAR | Status: DC
  Administered 2023-03-09 – 2023-03-12 (×3): 1 [IU] via SUBCUTANEOUS

## 2023-03-09 MED ORDER — FUROSEMIDE 10 MG/ML IJ SOLN
80.0000 mg | Freq: Once | INTRAMUSCULAR | Status: AC
  Administered 2023-03-09: 80 mg via INTRAVENOUS
  Filled 2023-03-09: qty 8

## 2023-03-09 MED ORDER — BISACODYL 5 MG PO TBEC: 5.0000 mg | DELAYED_RELEASE_TABLET | Freq: Every day | ORAL | Status: DC | PRN

## 2023-03-09 MED ORDER — HYDRALAZINE HCL 20 MG/ML IJ SOLN: 10.0000 mg | Freq: Four times a day (QID) | INTRAMUSCULAR | Status: DC | PRN

## 2023-03-09 MED ORDER — PNEUMOCOCCAL 20-VAL CONJ VACC 0.5 ML IM SUSY
0.5000 mL | PREFILLED_SYRINGE | INTRAMUSCULAR | Status: AC
  Administered 2023-03-10: 0.5 mL via INTRAMUSCULAR
  Filled 2023-03-09: qty 0.5

## 2023-03-09 MED ORDER — ACETAMINOPHEN 325 MG PO TABS
650.0000 mg | ORAL_TABLET | Freq: Four times a day (QID) | ORAL | Status: DC | PRN
Start: 2023-03-09 — End: 2023-03-12

## 2023-03-09 MED ORDER — FUROSEMIDE 10 MG/ML IJ SOLN
40.0000 mg | Freq: Two times a day (BID) | INTRAMUSCULAR | Status: DC
Start: 2023-03-10 — End: 2023-03-11
  Administered 2023-03-10 – 2023-03-11 (×3): 40 mg via INTRAVENOUS
  Filled 2023-03-09 (×3): qty 4

## 2023-03-09 NOTE — Progress Notes (Signed)
  Echocardiogram 2D Echocardiogram has been performed.  Robert Horn 03/09/2023, 2:26 PM

## 2023-03-09 NOTE — ED Notes (Signed)
ED TO INPATIENT HANDOFF REPORT  ED Nurse Name and Phone #: Theophilus Bones [phone]  S Name/Age/Gender Robert Horn 89 y.o. male Room/Bed: 028C/028C  Code Status   Code Status: Full Code  Home/SNF/Other Home Patient oriented to: self, place, time, and situation Is this baseline? Yes   Triage Complete: Triage complete  Chief Complaint Acute CHF (congestive heart failure) (HCC) [I50.9]  Triage Note Pt to ED via [facility] from home. Pt states he has been feeling short of breath and left leg pain tonight. Pt recently had surgery on right foot and has had to have medication readjustments.    Allergies Allergies  Allergen Reactions   Ticagrelor Other (See Comments)    Level of Care/Admitting Diagnosis ED Disposition     ED Disposition  Admit   Condition  --   Comment  Hospital Area: [facility] [100100]  Level of Care: Telemetry Cardiac [103]  May place patient in observation at [facility] or [facility] if equivalent level of care is available:: No  Covid Evaluation: Asymptomatic - no recent exposure (last 10 days) testing not required  Diagnosis: Acute CHF (congestive heart failure) [facility]) [[id number]]  Admitting Physician: Lorin Glass [[id number]]  Attending Physician: Lorin Glass [[id number]]          B Medical/Surgery History Past Medical History:  Diagnosis Date   Anemia    Anxiety    situational- surgery   Asthma    as a child   Cancer (HCC) 2019   colon- colectomy    CHF (congestive heart failure) (HCC)    Chronic kidney disease    followed by Dr. Nehemiah Settle   Coronary artery disease    Diabetes mellitus without complication (HCC)    Type II   Dyslipidemia 10/27/2015   Dyspnea    w/ exertion    Elevated PSA 10/27/2015   Erectile dysfunction 10/27/2015   Heart murmur    Hypertension 10/27/2015   Hypogonadism male 10/27/2015   Neuropathy    Obesity 10/27/2015   Peripheral vascular disease (HCC)    Pneumonia 10/27/2015   pt states was 1982    Rotator cuff tear 10/27/2015   Past Surgical History:  Procedure Laterality Date   ABDOMINAL AORTOGRAM N/A 02/01/2019   Procedure: ABDOMINAL AORTOGRAM;  Surgeon: Cephus Shelling, MD;  Location: [facility] INVASIVE CV LAB;  Service: Cardiovascular;  Laterality: N/A;   AMPUTATION Right 02/03/2019   Procedure: RIGHT FOOT 1ST RAY AMPUTATION;  Surgeon: Nadara Mustard, MD;  Location: [facility] OR;  Service: Orthopedics;  Laterality: Right;   AMPUTATION Right 08/02/2019   Procedure: RIGHT 2ND TOE AMPUTATION;  Surgeon: Nadara Mustard, MD;  Location: [facility] OR;  Service: Orthopedics;  Laterality: Right;   AMPUTATION Right 11/08/2020   Procedure: RIGHT TRANSMETATARSAL AMPUTATION;  Surgeon: Nadara Mustard, MD;  Location: [facility] OR;  Service: Orthopedics;  Laterality: Right;   APPLICATION OF WOUND VAC  11/08/2020   Procedure: APPLICATION OF WOUND VAC;  Surgeon: Nadara Mustard, MD;  Location: [facility] OR;  Service: Orthopedics;;   CARDIAC CATHETERIZATION N/A 11/14/2015   Procedure: Left Heart Cath and Coronary Angiography;  Surgeon: Lyn Records, MD;  Location: [facility] INVASIVE CV LAB;  Service: Cardiovascular;  Laterality: N/A;   COLONOSCOPY     LAPAROSCOPIC PARTIAL COLECTOMY  12/15/2017   LAPAROSCOPIC PARTIAL COLECTOMY (N/A Abdomen)   LAPAROSCOPIC PARTIAL COLECTOMY N/A 12/15/2017   Procedure: LAPAROSCOPIC PARTIAL COLECTOMY;  Surgeon: Harriette Bouillon, MD;  Location: [facility] OR;  Service: General;  Laterality: N/A;   LOWER  EXTREMITY ANGIOGRAPHY Right 02/01/2019   Procedure: LOWER EXTREMITY ANGIOGRAPHY;  Surgeon: Cephus Shelling, MD;  Location: [facility] INVASIVE CV LAB;  Service: Cardiovascular;  Laterality: Right;   PERIPHERAL VASCULAR INTERVENTION Right 02/01/2019   Procedure: PERIPHERAL VASCULAR INTERVENTION;  Surgeon: Cephus Shelling, MD;  Location: [facility] INVASIVE CV LAB;  Service: Cardiovascular;  Laterality: Right;  SFA   TEE WITHOUT CARDIOVERSION N/A 12/15/2016   Procedure: TRANSESOPHAGEAL ECHOCARDIOGRAM (TEE);  Surgeon: Tonny Bollman, MD;  Location: [facility] OR;  Service: Open Heart Surgery;  Laterality: N/A;   TRANSCATHETER AORTIC VALVE REPLACEMENT, TRANSFEMORAL N/A 12/15/2016   Procedure: TRANSCATHETER AORTIC VALVE REPLACEMENT, TRANSFEMORAL;  Surgeon: Tonny Bollman, MD;  Location: [facility] OR;  Service: Open Heart Surgery;  Laterality: N/A;     A IV Location/Drains/Wounds Patient Lines/Drains/Airways Status     Active Line/Drains/Airways     Name Placement date Placement time Site Days   Peripheral IV 03/09/23 20 G 1" Left Antecubital 03/09/23  1044  Antecubital  less than 1            Intake/Output Last 24 hours No intake or output data in the 24 hours ending 03/09/23 1146  Labs/Imaging Results for orders placed or performed during the hospital encounter of 03/09/23 (from the past 48 hour(s))  Basic metabolic panel     Status: Abnormal   Collection Time: 03/09/23  5:26 AM  Result Value Ref Range   Sodium 140 135 - 145 mmol/L   Potassium 3.6 3.5 - 5.1 mmol/L   Chloride 106 98 - 111 mmol/L   CO2 24 22 - 32 mmol/L   Glucose, Bld 125 (H) 70 - 99 mg/dL    Comment: Glucose reference range applies only to samples taken after fasting for at least 8 hours.   BUN 22 8 - 23 mg/dL   Creatinine, Ser 4.40 (H) 0.61 - 1.24 mg/dL   Calcium 9.0 8.9 - 34.7 mg/dL   GFR, Estimated 41 (L) >60 mL/min    Comment: (NOTE) Calculated using the CKD-EPI Creatinine Equation (2021)    Anion gap 10 5 - 15    Comment: Performed at [facility], [address], [place], [place] [address]  CBC     Status: Abnormal   Collection Time: 03/09/23  5:26 AM  Result Value Ref Range   WBC 7.5 4.0 - 10.5 K/uL   RBC 4.36 4.22 - 5.81 MIL/uL   Hemoglobin 12.8 (L) 13.0 - 17.0 g/dL   HCT 63.8 75.6 - 43.3 %   MCV 93.1 80.0 - 100.0 fL   MCH 29.4 26.0 - 34.0 pg   MCHC 31.5 30.0 - 36.0 g/dL   RDW 29.5 18.8 - 41.6 %   Platelets 177 150 - 400 K/uL   nRBC 0.0 0.0 - 0.2 %    Comment: Performed at [facility], [address]  [place], [place] [address]  Brain natriuretic peptide     Status: Abnormal   Collection Time: 03/09/23  5:26 AM  Result Value Ref Range   B Natriuretic Peptide 612.7 (H) 0.0 - 100.0 pg/mL    Comment: Performed at [facility], [address], [place], [place] [address]  Troponin I (High Sensitivity)     Status: Abnormal   Collection Time: 03/09/23  5:26 AM  Result Value Ref Range   Troponin I (High Sensitivity) 55 (H) <18 ng/L    Comment: (NOTE) Elevated high sensitivity troponin I (hsTnI) values and significant  changes across serial measurements may suggest  ACS but many other  chronic and acute conditions are known to elevate hsTnI results.  Refer to the "Links" section for chest pain algorithms and additional  guidance. Performed at [facility], [address] [place], [place] [address]   Troponin I (High Sensitivity)     Status: Abnormal   Collection Time: 03/09/23  9:24 AM  Result Value Ref Range   Troponin I (High Sensitivity) 118 (HH) <18 ng/L    Comment: CRITICAL RESULT CALLED TO, READ BACK BY AND VERIFIED WITH Lennette Fader, RN 1034 03/09/23 ALTOM,A (NOTE) Elevated high sensitivity troponin I (hsTnI) values and significant  changes across serial measurements may suggest ACS but many other  chronic and acute conditions are known to elevate hsTnI results.  Refer to the "Links" section for chest pain algorithms and additional  guidance. Performed at [facility], [address], [place], [place] [address]    DG Chest 2 View  Result Date: 03/09/2023 CLINICAL DATA:  89 year old male with history of shortness of breath. EXAM: CHEST - 2 VIEW COMPARISON:  Chest x-ray 02/28/2023. FINDINGS: Lung volumes are low. There are bibasilar opacities (left-greater-than-right), which may reflect areas of atelectasis and/or consolidation. Small left pleural effusion. No definite right pleural effusion. No pneumothorax. No evidence of pulmonary edema. Heart size is normal. Upper  mediastinal contours are within normal limits. Atherosclerotic calcifications are noted in the thoracic aorta. Status post TAVR. IMPRESSION: 1. Low lung volumes with bibasilar areas of atelectasis and/or consolidation with small left pleural effusion. 2. Aortic atherosclerosis. Electronically Signed   By: Trudie Reed M.D.   On: 03/09/2023 06:04    Pending Labs Unresulted Labs (From admission, onward)     Start     Ordered   03/10/23 0500  Basic metabolic panel  Tomorrow morning,   R        03/09/23 1143   03/10/23 0500  CBC  Tomorrow morning,   R        03/09/23 1143   03/10/23 0500  Magnesium  Tomorrow morning,   R        03/09/23 1143   03/10/23 0500  Phosphorus  Tomorrow morning,   R        03/09/23 1143   03/09/23 1142  Hemoglobin A1c  Add-on,   AD        03/09/23 1141            Vitals/Pain Today's Vitals   03/09/23 0519 03/09/23 0525 03/09/23 0835  BP:  100/89 (!) 146/100  Pulse:  74 64  Resp:  16 18  Temp:  98 F (36.7 C) 97.9 F (36.6 C)  TempSrc:  Oral   SpO2:  96% 95%  Weight: 99.8 kg    Height: 6' (1.829 m)    PainSc: 0-No pain      Isolation Precautions No active isolations  Medications Medications  furosemide (LASIX) injection 40 mg (has no administration in time range)  insulin aspart (novoLOG) injection 0-9 Units (has no administration in time range)  insulin aspart (novoLOG) injection 0-5 Units (has no administration in time range)  acetaminophen (TYLENOL) tablet 650 mg (has no administration in time range)    Or  acetaminophen (TYLENOL) suppository 650 mg (has no administration in time range)  senna (SENOKOT) tablet 8.6 mg (has no administration in time range)  polyethylene glycol (MIRALAX / GLYCOLAX) packet 17 g (has no administration in time range)  bisacodyl (DULCOLAX) EC tablet 5 mg (has no administration in time  range)  albuterol (PROVENTIL) (2.5 MG/3ML) 0.083% nebulizer solution 2.5 mg (has no administration in time range)  hydrALAZINE  (APRESOLINE) injection 10 mg (has no administration in time range)  enoxaparin (LOVENOX) injection 40 mg (has no administration in time range)  furosemide (LASIX) injection 80 mg (80 mg Intravenous Given 03/09/23 1142)    Mobility walks     Focused Assessments Pulmonary Assessment Handoff:  Lung sounds:   O2 Device: Room Air      R Recommendations: See Admitting Provider Note  Report given to:   Additional Notes: pt HOH

## 2023-03-09 NOTE — ED Triage Notes (Signed)
Pt to ED via [facility] from home. Pt states he has been feeling short of breath and left leg pain tonight. Pt recently had surgery on right foot and has had to have medication readjustments.

## 2023-03-09 NOTE — Research (Cosign Needed Addendum)
SITE: 050     Subject # [id number]   Subprotocol: A  Inclusion Criteria  Patients who meet all of the following criteria are eligible for enrollment as study participants:  Yes No  Age > 87 years old X   Eligible to wear Holter Study X    Exclusion Criteria  Patients who meet any of these criteria are not eligible for enrollment as study participants: Yes No  1. Receiving any mechanical (respiratory or circulatory) or renal support therapy at Screening or during Visit #1.  X  2.  Any other conditions that in the opinion of the investigators are likely to prevent compliance with the study protocol or pose a safety concern if the subject participates in the study.  X  3. Poor tolerance, namely susceptible to severe skin allergies from ECG adhesive patch application.  X   Protocol: REV H                                     Residential Zip code [address] (First 3 digits ONLY)                                             [employer] Informed Consent   Subject Name: Robert Horn  Subject met inclusion and exclusion criteria.  The informed consent form, study requirements and expectations were reviewed with the subject. Subject had opportunity to read consent and questions and concerns were addressed prior to the signing of the consent form.  The subject verbalized understanding of the trial requirements.  The subject agreed to participate in the [employer] EF ACT trial and signed the informed consent at 15:00 on 09-Mar-2023.  The informed consent was obtained prior to performance of any protocol-specific procedures for the subject.  A copy of the signed informed consent was given to the subject and a copy was placed in the subject's medical record.   Yassin Scales L Yeiren Whitecotton         No current facility-administered medications for this visit.  Current Outpatient Medications:    acetaminophen (TYLENOL) 500 MG tablet, Take 1,000 mg by mouth as needed for moderate pain (pain score 4-6) or mild pain (pain score  1-3)., Disp: , Rfl:    amLODipine (NORVASC) 10 MG tablet, Take 1 tablet (10 mg total) by mouth daily. (Patient not taking: Reported on 03/09/2023), Disp: 90 tablet, Rfl: 3   aspirin 81 MG EC tablet, Take 81 mg by mouth daily. Swallow whole., Disp: , Rfl:    carvedilol (COREG) 3.125 MG tablet, Take 1 tablet (3.125 mg total) by mouth 2 (two) times daily with a meal., Disp: 60 tablet, Rfl: 11   Cholecalciferol (VITAMIN D) 50 MCG (2000 UT) tablet, Take 2,000 Units by mouth daily., Disp: , Rfl:    clopidogrel (PLAVIX) 75 MG tablet, Take 1 tablet (75 mg total) by mouth daily., Disp: 30 tablet, Rfl: 11   diclofenac Sodium (VOLTAREN) 1 % GEL, Apply 1 Application topically as needed (pain)., Disp: , Rfl:    ezetimibe (ZETIA) 10 MG tablet, Take 1 tablet (10 mg total) by mouth daily., Disp: 90 tablet, Rfl: 3   ferrous sulfate 325 (65 FE) MG tablet, Take 325 mg by mouth daily with breakfast., Disp: , Rfl:    furosemide (LASIX) 40 MG tablet, TAKE 1.5 TABLETS BY  MOUTH DAILY. (Patient taking differently: Take 40 mg by mouth daily.), Disp: 45 tablet, Rfl: 11   JARDIANCE 25 MG TABS tablet, Take 25 mg by mouth daily., Disp: , Rfl:    omega-3 acid ethyl esters (LOVAZA) 1 g capsule, Take 1 capsule (1 g total) by mouth daily., Disp: 90 capsule, Rfl: 3   oxyCODONE-acetaminophen (PERCOCET/ROXICET) 5-325 MG tablet, Take 1 tablet by mouth every 4 (four) hours as needed for severe pain., Disp: 30 tablet, Rfl: 0   rosuvastatin (CRESTOR) 40 MG tablet, Take 1 tablet (40 mg total) by mouth daily., Disp: 30 tablet, Rfl: 5   sacubitril-valsartan (ENTRESTO) 49-51 MG, Take 1 tablet by mouth 2 (two) times daily. Please attend scheduled appointment for additional refills., Disp: 60 tablet, Rfl: 11   Semaglutide,0.25 or 0.5MG /DOS, (OZEMPIC, 0.25 OR 0.5 MG/DOSE,) 2 MG/1.5ML SOPN, Inject 0.5 mg into the skin once a week., Disp: , Rfl:    tiZANidine (ZANAFLEX) 2 MG tablet, 2 mg 3 (three) times daily as needed for muscle spasms. (Patient  not taking: Reported on 03/09/2023), Disp: , Rfl:    VITAMIN B COMPLEX-C PO, Take 1 tablet by mouth daily., Disp: , Rfl:   Facility-Administered Medications Ordered in Other Visits:    acetaminophen (TYLENOL) tablet 650 mg, 650 mg, Oral, Q6H PRN **OR** acetaminophen (TYLENOL) suppository 650 mg, 650 mg, Rectal, Q6H PRN, Dahal, Binaya, MD   albuterol (PROVENTIL) (2.5 MG/3ML) 0.083% nebulizer solution 2.5 mg, 2.5 mg, Nebulization, Q6H PRN, Dahal, Binaya, MD   bisacodyl (DULCOLAX) EC tablet 5 mg, 5 mg, Oral, Daily PRN, Dahal, Melina Schools, MD   [START ON 03/10/2023] enoxaparin (LOVENOX) injection 40 mg, 40 mg, Subcutaneous, Q24H, Dahal, Binaya, MD   [START ON 03/10/2023] furosemide (LASIX) injection 40 mg, 40 mg, Intravenous, BID, Dahal, Binaya, MD   hydrALAZINE (APRESOLINE) injection 10 mg, 10 mg, Intravenous, Q6H PRN, Dahal, Binaya, MD   insulin aspart (novoLOG) injection 0-5 Units, 0-5 Units, Subcutaneous, QHS, Dahal, Binaya, MD   insulin aspart (novoLOG) injection 0-9 Units, 0-9 Units, Subcutaneous, TID WC, Dahal, Binaya, MD, 1 Units at 03/09/23 1416   polyethylene glycol (MIRALAX / GLYCOLAX) packet 17 g, 17 g, Oral, Daily PRN, Dahal, Melina Schools, MD   senna (SENOKOT) tablet 8.6 mg, 1 tablet, Oral, BID, Dahal, Melina Schools, MD

## 2023-03-09 NOTE — H&P (Signed)
[facility] History and Physical  Kazi Thron AYT:[id number] DOB: [birth year] DOA: 03/09/2023 PCP: Renford Dills, MD  Presented from: Home Chief Complaint: Shortness of breath  History of Present Illness: Robert Horn is a 89 y.o. male with PMH significant for DM2, HTN, HLD, CAD, CHF, PAD, CKD, colon cancer status post colectomy, recent right partial foot amputation at [facility]. Patient presented to the ED today with concern of dyspnea, fatigue, progressively worsening edema of lower extremities and abdomen. Patient had CHF and used to follow with Dr. Katrinka Blazing, Dr. Eldridge Dace.  He has an upcoming appointment with Dr. Duke Salvia in March. Patient recently had right foot partial amputation at [facility].  That surgery had to be canceled 3 times and ultimately got done in the last week of November.  During that interval, patient was made to hold Lasix for multiple days.  Surgery was successful but patient and his wife state that patient breathing has not been normal since then.  12/1, patient was seen at the ED for dyspnea.  He was given IV Lasix.  Felt better with diuresis discharged home to follow-up with PCP. Last night, he woke up in the middle of the night with shortness of breath. Patient returned back to the ED today with worsening symptoms.  In the ED, afebrile, heart rate in 60s and 70s, blood pressure in normal range, breathing on room air at rest. Labs with WBC count normal, BUN/creatinine 22/1.61, BNP more than 600, Troponin 81>55>118 Chest x-ray showed low lung volumes with bibasilar areas of atelectasis and/or consolidation with small left pleural effusion. EKG showed sinus rhythm with PVCs/PACs with nonspecific ST-T wave changes.  Patient was given 1 dose of Lasix 80 mg Hospitalist service was consulted for in-hospital care  At the time of my evaluation, patient was lying down in ED bed.  Not in distress.  Not on supplemental oxygen.  Wife at bedside. Clinically does not seem to  have volume overload status but patient feels tightness in the chest. History reviewed and detailed as above.   Review of Systems:  All systems were reviewed and were negative unless otherwise mentioned in the HPI   Past medical history: Past Medical History:  Diagnosis Date   Anemia    Anxiety    situational- surgery   Asthma    as a child   Cancer (HCC) 2019   colon- colectomy    CHF (congestive heart failure) (HCC)    Chronic kidney disease    followed by Dr. Nehemiah Settle   Coronary artery disease    Diabetes mellitus without complication (HCC)    Type II   Dyslipidemia 10/27/2015   Dyspnea    w/ exertion    Elevated PSA 10/27/2015   Erectile dysfunction 10/27/2015   Heart murmur    Hypertension 10/27/2015   Hypogonadism male 10/27/2015   Neuropathy    Obesity 10/27/2015   Peripheral vascular disease (HCC)    Pneumonia 10/27/2015   pt states was 1982   Rotator cuff tear 10/27/2015    Past surgical history: Past Surgical History:  Procedure Laterality Date   ABDOMINAL AORTOGRAM N/A 02/01/2019   Procedure: ABDOMINAL AORTOGRAM;  Surgeon: Cephus Shelling, MD;  Location: [facility] INVASIVE CV LAB;  Service: Cardiovascular;  Laterality: N/A;   AMPUTATION Right 02/03/2019   Procedure: RIGHT FOOT 1ST RAY AMPUTATION;  Surgeon: Nadara Mustard, MD;  Location: [facility] OR;  Service: Orthopedics;  Laterality: Right;   AMPUTATION Right 08/02/2019   Procedure: RIGHT 2ND TOE AMPUTATION;  Surgeon: Aldean Baker  V, MD;  Location: [facility] OR;  Service: Orthopedics;  Laterality: Right;   AMPUTATION Right 11/08/2020   Procedure: RIGHT TRANSMETATARSAL AMPUTATION;  Surgeon: Nadara Mustard, MD;  Location: [facility] OR;  Service: Orthopedics;  Laterality: Right;   APPLICATION OF WOUND VAC  11/08/2020   Procedure: APPLICATION OF WOUND VAC;  Surgeon: Nadara Mustard, MD;  Location: [facility] OR;  Service: Orthopedics;;   CARDIAC CATHETERIZATION N/A 11/14/2015   Procedure: Left Heart Cath and Coronary Angiography;  Surgeon:  Lyn Records, MD;  Location: [facility] INVASIVE CV LAB;  Service: Cardiovascular;  Laterality: N/A;   COLONOSCOPY     LAPAROSCOPIC PARTIAL COLECTOMY  12/15/2017   LAPAROSCOPIC PARTIAL COLECTOMY (N/A Abdomen)   LAPAROSCOPIC PARTIAL COLECTOMY N/A 12/15/2017   Procedure: LAPAROSCOPIC PARTIAL COLECTOMY;  Surgeon: Harriette Bouillon, MD;  Location: [facility] OR;  Service: General;  Laterality: N/A;   LOWER EXTREMITY ANGIOGRAPHY Right 02/01/2019   Procedure: LOWER EXTREMITY ANGIOGRAPHY;  Surgeon: Cephus Shelling, MD;  Location: [facility] INVASIVE CV LAB;  Service: Cardiovascular;  Laterality: Right;   PERIPHERAL VASCULAR INTERVENTION Right 02/01/2019   Procedure: PERIPHERAL VASCULAR INTERVENTION;  Surgeon: Cephus Shelling, MD;  Location: [facility] INVASIVE CV LAB;  Service: Cardiovascular;  Laterality: Right;  SFA   TEE WITHOUT CARDIOVERSION N/A 12/15/2016   Procedure: TRANSESOPHAGEAL ECHOCARDIOGRAM (TEE);  Surgeon: Tonny Bollman, MD;  Location: [facility] OR;  Service: Open Heart Surgery;  Laterality: N/A;   TRANSCATHETER AORTIC VALVE REPLACEMENT, TRANSFEMORAL N/A 12/15/2016   Procedure: TRANSCATHETER AORTIC VALVE REPLACEMENT, TRANSFEMORAL;  Surgeon: Tonny Bollman, MD;  Location: [facility] OR;  Service: Open Heart Surgery;  Laterality: N/A;    Social History:  reports that he has quit smoking. His smoking use included cigarettes. He has never been exposed to tobacco smoke. He has never used smokeless tobacco. He reports current alcohol use. He reports that he does not use drugs.  Allergies:  Allergies  Allergen Reactions   Ticagrelor Other (See Comments)    Unknown    Ticagrelor   Family history:  Family History  Problem Relation Age of Onset   Diabetes Mother    Heart disease Mother    Pulmonary embolism Father      Physical Exam: Vitals:   03/09/23 1100 03/09/23 1130 03/09/23 1313 03/09/23 1418  BP: 134/78 (!) 142/83 (!) 128/95 (!) 136/104  Pulse: 75 75 74 74  Resp: 13 12 20 16   Temp:    98.2 F (36.8 C)   TempSrc:    Oral  SpO2: 98% 97% 100% 97%  Weight:      Height:       Wt Readings from Last 3 Encounters:  03/09/23 99.8 kg  02/28/23 101.6 kg  02/17/23 104.2 kg   Body mass index is 29.84 kg/m.  General exam: Pleasant, elderly African-American male. Skin: No rashes, lesions or ulcers. HEENT: Atraumatic, normocephalic, no obvious bleeding Lungs: Clear to auscultation bilaterally CVS: Regular rate and rhythm, no murmur GI/Abd soft, nontender, nondistended, bowel sound present CNS: Alert, awake, oriented x 3 Psychiatry: Mood appropriate Extremities: No pedal edema, no calf tenderness   ----------------------------------------------------------------------------------------------------------------------------------------- ----------------------------------------------------------------------------------------------------------------------------------------- -----------------------------------------------------------------------------------------------------------------------------------------  Assessment/Plan: Principal Problem:   Acute CHF (congestive heart failure) (HCC)  Acute exacerbation of CHF Essential hypertension Presenting with shortness of breath, anasarca BNP elevated Most recent echo from 2022 with EF 45 to 50%, grade 1 diastolic dysfunction, normal RV function and size Clinically concerning for heart failure exacerbation. Given IV Lasix 80 mg 1 dose.  Started on IV Lasix 40 mg twice daily. PTA meds-  Coreg 3.125 mg twice daily Lasix 40 mg daily, Jardiance 25 mg daily, Entresto 49/51 mg twice daily Obtain echocardiogram. Resume Coreg and Entresto with IV Lasix.  Keep Jardiance on hold Net IO Since Admission: -1,000 mL [03/09/23 1527] Continue to monitor for daily intake output, weight, blood pressure, BNP, renal function and electrolytes. Recent Labs  Lab 03/09/23 0526  BNP 612.7*  BUN 22  CREATININE 1.61*  NA 140  K 3.6   Elevated  troponin CAD HLD Monitor for WMA in echocardiogram  PTA meds- aspirin, Plavix, Crestor, Zetia Resume all Since patient's volume status does not seem that bad, I would want to rule out PE especially given recent surgery.  Obtain VQ scan. Recent Labs    03/09/23 0526 03/09/23 0924  TROPONINIHS 55* 118*   PAD S/p recent right partial foot amputation at [facility]  CKD 3B Creatinine at baseline. Recent Labs    08/11/22 1607 02/28/23 0944 03/09/23 0526  BUN 24 20 22   CREATININE 1.54* 1.46* 1.61*   Type 2 diabetes mellitus A1c pending PTA meds-Ozempic weekly on Wednesday Start SSI/Accu-Cheks Lab Results  Component Value Date   HGBA1C 6.9 (H) 03/13/2021   Recent Labs  Lab 03/09/23 1330  GLUCAP 126*    colon cancer  S/p colectomy  Mobility: Encourage ambulation  Goals of care   Code Status: Full Code    DVT prophylaxis:  enoxaparin (LOVENOX) injection 40 mg Start: 03/10/23 0800   Antimicrobials: None Fluid: None Consultants: None Family Communication: Wife at bedside  Dispo: The patient is from: Home              Anticipated d/c is to: Home, pending clinical course  Diet: Diet Order             Diet Heart Room service appropriate? Yes; Fluid consistency: Thin  Diet effective now                    ------------------------------------------------------------------------------------- Severity of Illness: The appropriate patient status for this patient is OBSERVATION. Observation status is judged to be reasonable and necessary in order to provide the required intensity of service to ensure the patient's safety. The patient's presenting symptoms, physical exam findings, and initial radiographic and laboratory data in the context of their medical condition is felt to place them at decreased risk for further clinical deterioration. Furthermore, it is anticipated that the patient will be medically stable for discharge from the hospital within 2 midnights of  admission.    Home Meds: Prior to Admission medications   Medication Sig Start Date End Date Taking? Authorizing Provider  amLODipine (NORVASC) 10 MG tablet Take 1 tablet (10 mg total) by mouth daily. 03/11/22   Lyn Records, MD  aspirin 81 MG EC tablet Take 81 mg by mouth daily. Swallow whole. Patient not taking: Reported on 02/17/2023    [provider]  carvedilol (COREG) 3.125 MG tablet Take 1 tablet (3.125 mg total) by mouth 2 (two) times daily with a meal. 03/11/22   Lyn Records, MD  Cholecalciferol (VITAMIN D) 50 MCG (2000 UT) tablet Take 2,000 Units by mouth daily.    [provider]  clopidogrel (PLAVIX) 75 MG tablet Take 1 tablet (75 mg total) by mouth daily. 03/11/22   Lyn Records, MD  diclofenac Sodium (VOLTAREN) 1 % GEL Apply 4 g topically as needed. 03/03/22   [provider]  ezetimibe (ZETIA) 10 MG tablet Take 1 tablet (10 mg total) by mouth daily. 08/11/22 02/17/23  Corky Crafts, MD  ferrous sulfate 325 (65 FE) MG tablet Take 325 mg by mouth daily with breakfast.    [provider]  furosemide (LASIX) 40 MG tablet TAKE 1.5 TABLETS BY MOUTH DAILY. 03/11/22   Lyn Records, MD  JARDIANCE 25 MG TABS tablet Take 25 mg by mouth daily. 12/06/19   [provider]  Multiple Vitamin (MULTIVITAMIN WITH MINERALS) TABS tablet Take 1 tablet by mouth daily. 10/27/15   Barnetta Chapel, MD  omega-3 acid ethyl esters (LOVAZA) 1 g capsule Take 1 capsule (1 g total) by mouth daily. 06/11/22   Corky Crafts, MD  oxyCODONE-acetaminophen (PERCOCET/ROXICET) 5-325 MG tablet Take 1 tablet by mouth every 4 (four) hours as needed for severe pain. Patient not taking: Reported on 02/17/2023 05/26/22   Nadara Mustard, MD  rosuvastatin (CRESTOR) 40 MG tablet Take 1 tablet (40 mg total) by mouth daily. 05/15/21   Ihor Austin, NP  sacubitril-valsartan (ENTRESTO) 49-51 MG Take 1 tablet by mouth 2 (two) times daily. Please attend scheduled  appointment for additional refills. 03/11/22   Lyn Records, MD  Semaglutide,0.25 or 0.5MG /DOS, (OZEMPIC, 0.25 OR 0.5 MG/DOSE,) 2 MG/1.5ML SOPN Inject 0.5 mg into the skin once a week.    [provider]  tiZANidine (ZANAFLEX) 2 MG tablet 2 mg 3 (three) times daily as needed for muscle spasms. 03/03/22   [provider]  VITAMIN B COMPLEX-C PO Take 1 tablet by mouth daily.    [provider]  Wound Dressings (HYDROFERA BLUE 4"X4") PADS Apply 1 Application topically daily. 03/17/22   Adonis Huguenin, NP    Labs on Admission:   CBC: Recent Labs  Lab 03/09/23 0526  WBC 7.5  HGB 12.8*  HCT 40.6  MCV 93.1  PLT 177    Basic Metabolic Panel: Recent Labs  Lab 03/09/23 0526  NA 140  K 3.6  CL 106  CO2 24  GLUCOSE 125*  BUN 22  CREATININE 1.61*  CALCIUM 9.0    Liver Function Tests: No results for input(s): "AST", "ALT", "ALKPHOS", "BILITOT", "PROT", "ALBUMIN" in the last 168 hours. No results for input(s): "LIPASE", "AMYLASE" in the last 168 hours. No results for input(s): "AMMONIA" in the last 168 hours.  Cardiac Enzymes: No results for input(s): "CKTOTAL", "CKMB", "CKMBINDEX", "TROPONINI" in the last 168 hours.  BNP (last 3 results) Recent Labs    02/28/23 0944 03/09/23 0526  BNP 1,471.3* 612.7*    ProBNP (last 3 results) No results for input(s): "PROBNP" in the last 8760 hours.  CBG: Recent Labs  Lab 03/09/23 1330  GLUCAP 126*    Lipase  No results found for: "LIPASE"   Urinalysis    Component Value Date/Time   COLORURINE YELLOW 03/16/2021 1714   APPEARANCEUR CLEAR 03/16/2021 1714   LABSPEC 1.010 03/16/2021 1714   PHURINE 5.0 03/16/2021 1714   GLUCOSEU >=500 (A) 03/16/2021 1714   HGBUR TRACE (A) 03/16/2021 1714   BILIRUBINUR NEGATIVE 03/16/2021 1714   KETONESUR NEGATIVE 03/16/2021 1714   PROTEINUR NEGATIVE 03/16/2021 1714   NITRITE POSITIVE (A) 03/16/2021 1714   LEUKOCYTESUR SMALL (A) 03/16/2021 1714     Drugs of Abuse   No results found for: "LABOPIA", "COCAINSCRNUR", "LABBENZ", "AMPHETMU", "THCU", "LABBARB"    Radiological Exams on Admission: DG Chest 2 View  Result Date: 03/09/2023 CLINICAL DATA:  89 year old male with history of shortness of breath. EXAM: CHEST - 2 VIEW COMPARISON:  Chest x-ray 02/28/2023. FINDINGS: Lung volumes are low. There are bibasilar  opacities (left-greater-than-right), which may reflect areas of atelectasis and/or consolidation. Small left pleural effusion. No definite right pleural effusion. No pneumothorax. No evidence of pulmonary edema. Heart size is normal. Upper mediastinal contours are within normal limits. Atherosclerotic calcifications are noted in the thoracic aorta. Status post TAVR. IMPRESSION: 1. Low lung volumes with bibasilar areas of atelectasis and/or consolidation with small left pleural effusion. 2. Aortic atherosclerosis. Electronically Signed   By: Trudie Reed M.D.   On: 03/09/2023 06:04     Signed, Lorin Glass, MD [facility] 03/09/2023

## 2023-03-09 NOTE — ED Notes (Addendum)
Updated on wait for treatment room. 

## 2023-03-09 NOTE — ED Notes (Signed)
ED TO INPATIENT HANDOFF REPORT  ED Nurse Name and Phone #:   S Name/Age/Gender Robert Horn 89 y.o. male Room/Bed: 043C/043C  Code Status   Code Status: Full Code  Home/SNF/Other Home Patient oriented to: self, place, time, and situation Is this baseline? Yes   Triage Complete: Triage complete  Chief Complaint Acute CHF (congestive heart failure) (HCC) [I50.9]  Triage Note Pt to ED via [facility] from home. Pt states he has been feeling short of breath and left leg pain tonight. Pt recently had surgery on right foot and has had to have medication readjustments.    Allergies Allergies  Allergen Reactions   Ticagrelor Other (See Comments)    Unknown     Level of Care/Admitting Diagnosis ED Disposition     ED Disposition  Admit   Condition  --   Comment  Hospital Area: [facility] [[id number]]  Level of Care: Telemetry Cardiac [103]  May place patient in observation at [facility] or [facility] if equivalent level of care is available:: No  Covid Evaluation: Asymptomatic - no recent exposure (last 10 days) testing not required  Diagnosis: Acute CHF (congestive heart failure) [facility] [[id number]]  Admitting Physician: Lorin Glass [[id number]]  Attending Physician: Lorin Glass [[id number]]          B Medical/Surgery History Past Medical History:  Diagnosis Date   Anemia    Anxiety    situational- surgery   Asthma    as a child   Cancer (HCC) 2019   colon- colectomy    CHF (congestive heart failure) (HCC)    Chronic kidney disease    followed by Dr. Nehemiah Settle   Coronary artery disease    Diabetes mellitus without complication (HCC)    Type II   Dyslipidemia 10/27/2015   Dyspnea    w/ exertion    Elevated PSA 10/27/2015   Erectile dysfunction 10/27/2015   Heart murmur    Hypertension 10/27/2015   Hypogonadism male 10/27/2015   Neuropathy    Obesity 10/27/2015   Peripheral vascular disease (HCC)    Pneumonia 10/27/2015   pt states was 1982    Rotator cuff tear 10/27/2015   Past Surgical History:  Procedure Laterality Date   ABDOMINAL AORTOGRAM N/A 02/01/2019   Procedure: ABDOMINAL AORTOGRAM;  Surgeon: Cephus Shelling, MD;  Location: [facility] INVASIVE CV LAB;  Service: Cardiovascular;  Laterality: N/A;   AMPUTATION Right 02/03/2019   Procedure: RIGHT FOOT 1ST RAY AMPUTATION;  Surgeon: Nadara Mustard, MD;  Location: [facility] OR;  Service: Orthopedics;  Laterality: Right;   AMPUTATION Right 08/02/2019   Procedure: RIGHT 2ND TOE AMPUTATION;  Surgeon: Nadara Mustard, MD;  Location: [facility] OR;  Service: Orthopedics;  Laterality: Right;   AMPUTATION Right 11/08/2020   Procedure: RIGHT TRANSMETATARSAL AMPUTATION;  Surgeon: Nadara Mustard, MD;  Location: [facility] OR;  Service: Orthopedics;  Laterality: Right;   APPLICATION OF WOUND VAC  11/08/2020   Procedure: APPLICATION OF WOUND VAC;  Surgeon: Nadara Mustard, MD;  Location: [facility] OR;  Service: Orthopedics;;   CARDIAC CATHETERIZATION N/A 11/14/2015   Procedure: Left Heart Cath and Coronary Angiography;  Surgeon: Lyn Records, MD;  Location: [facility] INVASIVE CV LAB;  Service: Cardiovascular;  Laterality: N/A;   COLONOSCOPY     LAPAROSCOPIC PARTIAL COLECTOMY  12/15/2017   LAPAROSCOPIC PARTIAL COLECTOMY (N/A Abdomen)   LAPAROSCOPIC PARTIAL COLECTOMY N/A 12/15/2017   Procedure: LAPAROSCOPIC PARTIAL COLECTOMY;  Surgeon: Harriette Bouillon, MD;  Location: [facility] OR;  Service: General;  Laterality:  N/A;   LOWER EXTREMITY ANGIOGRAPHY Right 02/01/2019   Procedure: LOWER EXTREMITY ANGIOGRAPHY;  Surgeon: Cephus Shelling, MD;  Location: [facility] INVASIVE CV LAB;  Service: Cardiovascular;  Laterality: Right;   PERIPHERAL VASCULAR INTERVENTION Right 02/01/2019   Procedure: PERIPHERAL VASCULAR INTERVENTION;  Surgeon: Cephus Shelling, MD;  Location: [facility] INVASIVE CV LAB;  Service: Cardiovascular;  Laterality: Right;  SFA   TEE WITHOUT CARDIOVERSION N/A 12/15/2016   Procedure: TRANSESOPHAGEAL ECHOCARDIOGRAM (TEE);  Surgeon: Tonny Bollman,  MD;  Location: [facility] OR;  Service: Open Heart Surgery;  Laterality: N/A;   TRANSCATHETER AORTIC VALVE REPLACEMENT, TRANSFEMORAL N/A 12/15/2016   Procedure: TRANSCATHETER AORTIC VALVE REPLACEMENT, TRANSFEMORAL;  Surgeon: Tonny Bollman, MD;  Location: [facility] OR;  Service: Open Heart Surgery;  Laterality: N/A;     A IV Location/Drains/Wounds Patient Lines/Drains/Airways Status     Active Line/Drains/Airways     Name Placement date Placement time Site Days   Peripheral IV 03/09/23 20 G 1" Left Antecubital 03/09/23  1044  Antecubital  less than 1            Intake/Output Last 24 hours  Intake/Output Summary (Last 24 hours) at 03/09/2023 2325 Last data filed at 03/09/2023 2210 Gross per 24 hour  Intake --  Output 2125 ml  Net -2125 ml    Labs/Imaging Results for orders placed or performed during the hospital encounter of 03/09/23 (from the past 48 hour(s))  Basic metabolic panel     Status: Abnormal   Collection Time: 03/09/23  5:26 AM  Result Value Ref Range   Sodium 140 135 - 145 mmol/L   Potassium 3.6 3.5 - 5.1 mmol/L   Chloride 106 98 - 111 mmol/L   CO2 24 22 - 32 mmol/L   Glucose, Bld 125 (H) 70 - 99 mg/dL    Comment: Glucose reference range applies only to samples taken after fasting for at least 8 hours.   BUN 22 8 - 23 mg/dL   Creatinine, Ser 1.91 (H) 0.61 - 1.24 mg/dL   Calcium 9.0 8.9 - 47.8 mg/dL   GFR, Estimated 41 (L) >60 mL/min    Comment: (NOTE) Calculated using the CKD-EPI Creatinine Equation (2021)    Anion gap 10 5 - 15    Comment: Performed at [facility], [address] [place], [place] [address]  CBC     Status: Abnormal   Collection Time: 03/09/23  5:26 AM  Result Value Ref Range   WBC 7.5 4.0 - 10.5 K/uL   RBC 4.36 4.22 - 5.81 MIL/uL   Hemoglobin 12.8 (L) 13.0 - 17.0 g/dL   HCT 13.0 86.5 - 78.4 %   MCV 93.1 80.0 - 100.0 fL   MCH 29.4 26.0 - 34.0 pg   MCHC 31.5 30.0 - 36.0 g/dL   RDW 69.6 29.5 - 28.4 %   Platelets 177 150 - 400 K/uL    nRBC 0.0 0.0 - 0.2 %    Comment: Performed at [facility], [address] [place], [place] [address]  Brain natriuretic peptide     Status: Abnormal   Collection Time: 03/09/23  5:26 AM  Result Value Ref Range   B Natriuretic Peptide 612.7 (H) 0.0 - 100.0 pg/mL    Comment: Performed at [facility], [address], [place], [place] [address]  Troponin I (High Sensitivity)     Status: Abnormal   Collection Time: 03/09/23  5:26 AM  Result Value Ref Range   Troponin I (High Sensitivity) 55 (  H) <18 ng/L    Comment: (NOTE) Elevated high sensitivity troponin I (hsTnI) values and significant  changes across serial measurements may suggest ACS but many other  chronic and acute conditions are known to elevate hsTnI results.  Refer to the "Links" section for chest pain algorithms and additional  guidance. Performed at [facility], [address] [place], [place] [address]   Troponin I (High Sensitivity)     Status: Abnormal   Collection Time: 03/09/23  9:24 AM  Result Value Ref Range   Troponin I (High Sensitivity) 118 (HH) <18 ng/L    Comment: CRITICAL RESULT CALLED TO, READ BACK BY AND VERIFIED WITH CINDY BLANKS, RN 1034 03/09/23 ALTOM,A (NOTE) Elevated high sensitivity troponin I (hsTnI) values and significant  changes across serial measurements may suggest ACS but many other  chronic and acute conditions are known to elevate hsTnI results.  Refer to the "Links" section for chest pain algorithms and additional  guidance. Performed at [facility], [address], [place], [place] [address]   CBG monitoring, ED     Status: Abnormal   Collection Time: 03/09/23  1:30 PM  Result Value Ref Range   Glucose-Capillary 126 (H) 70 - 99 mg/dL    Comment: Glucose reference range applies only to samples taken after fasting for at least 8 hours.  CBG monitoring, ED     Status: Abnormal   Collection Time: 03/09/23  4:35 PM  Result Value Ref Range   Glucose-Capillary  126 (H) 70 - 99 mg/dL    Comment: Glucose reference range applies only to samples taken after fasting for at least 8 hours.   Comment 1 Notify RN    Comment 2 Document in Chart   CBG monitoring, ED     Status: Abnormal   Collection Time: 03/09/23  9:59 PM  Result Value Ref Range   Glucose-Capillary 118 (H) 70 - 99 mg/dL    Comment: Glucose reference range applies only to samples taken after fasting for at least 8 hours.   ECHOCARDIOGRAM COMPLETE  Result Date: 03/09/2023    ECHOCARDIOGRAM REPORT   Patient Name:   ZIQUAN MUSCH Date of Exam: 03/09/2023 Medical Rec #:  [id number]     Height:       72.0 in Accession #:    [id number]    Weight:       220.0 lb Date of Birth:  [birth year]     BSA:          2.219 m Patient Age:    89 years      BP:           128/95 mmHg Patient Gender: M             HR:           53 bpm. Exam Location:  Inpatient Procedure: 2D Echo, Cardiac Doppler, Color Doppler and 3D Echo Indications:    R94.31 Abnormal EKG  History:        Patient has prior history of Echocardiogram examinations, most                 recent 03/13/2021. CHF, Abnormal ECG, TIA, Aortic Valve Disease;                 Risk Factors:Hypertension and Dyslipidemia. Severe aortic                 stenosis. TAVR.  Aortic Valve: 26 mm Edwards Sapien prosthetic, stented (TAVR)                 valve is present in the aortic position. Procedure Date:                 12/15/2016.  Sonographer:    Sheralyn Boatman RDCS Referring Phys: [id number] [facility] IMPRESSIONS  1. Left ventricular ejection fraction, by estimation, is 25 to 30%. Left ventricular ejection fraction by 3D volume is 31 %. The left ventricle has severely decreased function. The left ventricle demonstrates global hypokinesis. The left ventricular internal cavity size was mildly dilated. Left ventricular diastolic parameters are consistent with Grade III diastolic dysfunction (restrictive). There is the interventricular septum is flattened in systole and  diastole, consistent with right ventricular  pressure and volume overload.  2. Right ventricular systolic function is low normal. The right ventricular size is moderately enlarged. Mildly increased right ventricular wall thickness.  3. Left atrial size was moderately dilated.  4. Right atrial size was moderately dilated.  5. There is no evidence of cardiac tamponade.  6. The mitral valve is degenerative. Mild mitral valve regurgitation.  7. The aortic valve has been repaired/replaced. Aortic valve regurgitation is not visualized. There is a 26 mm Edwards Sapien prosthetic (TAVR) valve present in the aortic position. Procedure Date: 12/15/2016. Aortic valve mean gradient measures 14.3 mmHg. Aortic valve Vmax measures 2.58 m/s. Aortic valve acceleration time measures 67 msec.  8. Aortic dilatation noted. There is dilatation of the ascending aorta, measuring 38 mm. There is dilatation of the ascending aorta, measuring 38 mm. Comparison(s): Prior images reviewed side by side. The left ventricular function is significantly worse. The left ventricular diastolic function is significantly worse. TAVR gradients are lower due to reduction in LV function. FINDINGS  Left Ventricle: Left ventricular ejection fraction, by estimation, is 25 to 30%. Left ventricular ejection fraction by 3D volume is 31 %. The left ventricle has severely decreased function. The left ventricle demonstrates global hypokinesis. The left ventricular internal cavity size was mildly dilated. There is no left ventricular hypertrophy. The interventricular septum is flattened in systole and diastole, consistent with right ventricular pressure and volume overload. Left ventricular diastolic function could not be evaluated due to atrial fibrillation. Left ventricular diastolic parameters are consistent with Grade III diastolic dysfunction (restrictive). Right Ventricle: The right ventricular size is moderately enlarged. Mildly increased right ventricular wall  thickness. Right ventricular systolic function is low normal. Left Atrium: Left atrial size was moderately dilated. Right Atrium: Right atrial size was moderately dilated. Pericardium: Trivial pericardial effusion is present. There is no evidence of cardiac tamponade. Mitral Valve: The mitral valve is degenerative in appearance. Mild mitral valve regurgitation. Tricuspid Valve: The tricuspid valve is grossly normal. Tricuspid valve regurgitation is trivial. No evidence of tricuspid stenosis. Aortic Valve: The aortic valve has been repaired/replaced. Aortic valve regurgitation is not visualized. Aortic valve mean gradient measures 14.3 mmHg. Aortic valve peak gradient measures 26.6 mmHg. Aortic valve area, by VTI measures 1.50 cm. There is a  26 mm Edwards Sapien prosthetic, stented (TAVR) valve present in the aortic position. Procedure Date: 12/15/2016. Pulmonic Valve: The pulmonic valve was normal in structure. Pulmonic valve regurgitation is not visualized. No evidence of pulmonic stenosis. Aorta: Aortic dilatation noted and the aortic root is normal in size and structure. There is dilatation of the ascending aorta, measuring 38 mm. There is dilatation of the ascending aorta, measuring 38 mm. IAS/Shunts: No atrial level shunt  detected by color flow Doppler.  LEFT VENTRICLE PLAX 2D LVIDd:         5.80 cm LVIDs:         5.00 cm LV PW:         1.10 cm         3D Volume EF LV IVS:        0.90 cm         LV 3D EF:    Left LVOT diam:     2.55 cm                      ventricul LV SV:         75                           ar LV SV Index:   34                           ejection LVOT Area:     5.11 cm                     fraction                                             by 3D                                             volume is LV Volumes (MOD)                            31 %. LV vol d, MOD    167.0 ml A2C: LV vol d, MOD    152.0 ml      3D Volume EF: A4C:                           3D EF:        31 % LV vol s, MOD     113.0 ml      LV EDV:       163 ml A2C:                           LV ESV:       112 ml LV vol s, MOD    110.0 ml      LV SV:        51 ml A4C: LV SV MOD A2C:   54.0 ml LV SV MOD A4C:   152.0 ml LV SV MOD BP:    50.9 ml RIGHT VENTRICLE            IVC RV S prime:     9.68 cm/s  IVC diam: 1.90 cm TAPSE (M-mode): 1.6 cm LEFT ATRIUM            Index        RIGHT ATRIUM           Index LA diam:      5.40 cm  2.43 cm/m   RA Area:     18.50  cm LA Vol (A2C): 116.0 ml 52.28 ml/m  RA Volume:   48.80 ml  22.00 ml/m LA Vol (A4C): 48.8 ml  21.97 ml/m  AORTIC VALVE AV Area (Vmax):    1.55 cm AV Area (Vmean):   1.48 cm AV Area (VTI):     1.50 cm AV Vmax:           257.67 cm/s AV Vmean:          176.667 cm/s AV VTI:            0.502 m AV Peak Grad:      26.6 mmHg AV Mean Grad:      14.3 mmHg LVOT Vmax:         78.35 cm/s LVOT Vmean:        51.150 cm/s LVOT VTI:          0.148 m LVOT/AV VTI ratio: 0.29  AORTA Ao Root diam: 3.80 cm Ao Asc diam:  3.80 cm MITRAL VALVE                TRICUSPID VALVE MV Area (PHT): 6.63 cm     TR Peak grad:   26.4 mmHg MV Decel Time: 115 msec     TR Vmax:        257.00 cm/s MV E velocity: 114.25 cm/s                             SHUNTS                             Systemic VTI:  0.15 m                             Systemic Diam: 2.55 cm Rachelle Hora Croitoru MD Electronically signed by Thurmon Fair MD Signature Date/Time: 03/09/2023/4:23:09 PM    Final    DG Chest 2 View  Result Date: 03/09/2023 CLINICAL DATA:  89 year old male with history of shortness of breath. EXAM: CHEST - 2 VIEW COMPARISON:  Chest x-ray 02/28/2023. FINDINGS: Lung volumes are low. There are bibasilar opacities (left-greater-than-right), which may reflect areas of atelectasis and/or consolidation. Small left pleural effusion. No definite right pleural effusion. No pneumothorax. No evidence of pulmonary edema. Heart size is normal. Upper mediastinal contours are within normal limits. Atherosclerotic calcifications are noted in  the thoracic aorta. Status post TAVR. IMPRESSION: 1. Low lung volumes with bibasilar areas of atelectasis and/or consolidation with small left pleural effusion. 2. Aortic atherosclerosis. Electronically Signed   By: Trudie Reed M.D.   On: 03/09/2023 06:04    Pending Labs Unresulted Labs (From admission, onward)     Start     Ordered   03/10/23 0500  Basic metabolic panel  Tomorrow morning,   R        03/09/23 1143   03/10/23 0500  CBC  Tomorrow morning,   R        03/09/23 1143   03/10/23 0500  Magnesium  Tomorrow morning,   R        03/09/23 1143   03/10/23 0500  Phosphorus  Tomorrow morning,   R        03/09/23 1143   03/09/23 1142  Hemoglobin A1c  Add-on,   AD        03/09/23 1141  Vitals/Pain Today's Vitals   03/09/23 2215 03/09/23 2222 03/09/23 2230 03/09/23 2245  BP: 116/85  125/86 (!) 106/92  Pulse: 79  68 81  Resp:   17 16  Temp:  97.7 F (36.5 C)    TempSrc:  Axillary    SpO2:   99% 100%  Weight:      Height:      PainSc:  0-No pain      Isolation Precautions No active isolations  Medications Medications  furosemide (LASIX) injection 40 mg (has no administration in time range)  insulin aspart (novoLOG) injection 0-9 Units (1 Units Subcutaneous Given 03/09/23 1642)  insulin aspart (novoLOG) injection 0-5 Units ( Subcutaneous Not Given 03/09/23 2213)  acetaminophen (TYLENOL) tablet 650 mg (has no administration in time range)    Or  acetaminophen (TYLENOL) suppository 650 mg (has no administration in time range)  senna (SENOKOT) tablet 8.6 mg (8.6 mg Oral Not Given 03/09/23 2158)  polyethylene glycol (MIRALAX / GLYCOLAX) packet 17 g (has no administration in time range)  bisacodyl (DULCOLAX) EC tablet 5 mg (has no administration in time range)  albuterol (PROVENTIL) (2.5 MG/3ML) 0.083% nebulizer solution 2.5 mg (has no administration in time range)  hydrALAZINE (APRESOLINE) injection 10 mg (has no administration in time range)  enoxaparin  (LOVENOX) injection 40 mg (has no administration in time range)  pneumococcal 20-valent conjugate vaccine (PREVNAR 20) injection 0.5 mL (has no administration in time range)  carvedilol (COREG) tablet 3.125 mg (3.125 mg Oral Given 03/09/23 2215)  sacubitril-valsartan (ENTRESTO) 49-51 mg per tablet (1 tablet Oral Given 03/09/23 2216)  furosemide (LASIX) injection 80 mg (80 mg Intravenous Given 03/09/23 1142)    Mobility non-ambulatory     Focused Assessments    R Recommendations: See Admitting Provider Note  Report given to:   Additional Notes:

## 2023-03-09 NOTE — ED Provider Notes (Addendum)
[place] EMERGENCY DEPARTMENT AT [facility] Provider Note   CSN: [id number] Arrival date & time: 03/09/23  0510     History  Chief Complaint  Patient presents with   Shortness of Breath    Elefterios Fletes is a 89 y.o. male.  HPI Presents with his wife who assists with the history.  He presents with concern for dyspnea, fatigue, abdominal protuberance and lower extremity edema.  He has multiple medical issues including heart failure, renal dysfunction, hypertension.  He notes that he has been taking his Lasix regularly since ED evaluation about 2 weeks ago during which he had IV diuresis.  In spite of this patient has worsening symptoms in particular over the past 2 days.  No focal chest pain, no syncope, no fever.    Home Medications Prior to Admission medications   Medication Sig Start Date End Date Taking? Authorizing Provider  amLODipine (NORVASC) 10 MG tablet Take 1 tablet (10 mg total) by mouth daily. 03/11/22   Lyn Records, MD  aspirin 81 MG EC tablet Take 81 mg by mouth daily. Swallow whole. Patient not taking: Reported on 02/17/2023    [provider]  carvedilol (COREG) 3.125 MG tablet Take 1 tablet (3.125 mg total) by mouth 2 (two) times daily with a meal. 03/11/22   Lyn Records, MD  Cholecalciferol (VITAMIN D) 50 MCG (2000 UT) tablet Take 2,000 Units by mouth daily.    [provider]  clopidogrel (PLAVIX) 75 MG tablet Take 1 tablet (75 mg total) by mouth daily. 03/11/22   Lyn Records, MD  diclofenac Sodium (VOLTAREN) 1 % GEL Apply 4 g topically as needed. 03/03/22   [provider]  ezetimibe (ZETIA) 10 MG tablet Take 1 tablet (10 mg total) by mouth daily. 08/11/22 02/17/23  Corky Crafts, MD  ferrous sulfate 325 (65 FE) MG tablet Take 325 mg by mouth daily with breakfast.    [provider]  furosemide (LASIX) 40 MG tablet TAKE 1.5 TABLETS BY MOUTH DAILY. 03/11/22   Lyn Records, MD  JARDIANCE 25 MG TABS  tablet Take 25 mg by mouth daily. 12/06/19   [provider]  Multiple Vitamin (MULTIVITAMIN WITH MINERALS) TABS tablet Take 1 tablet by mouth daily. 10/27/15   Barnetta Chapel, MD  omega-3 acid ethyl esters (LOVAZA) 1 g capsule Take 1 capsule (1 g total) by mouth daily. 06/11/22   Corky Crafts, MD  oxyCODONE-acetaminophen (PERCOCET/ROXICET) 5-325 MG tablet Take 1 tablet by mouth every 4 (four) hours as needed for severe pain. Patient not taking: Reported on 02/17/2023 05/26/22   Nadara Mustard, MD  rosuvastatin (CRESTOR) 40 MG tablet Take 1 tablet (40 mg total) by mouth daily. 05/15/21   Ihor Austin, NP  sacubitril-valsartan (ENTRESTO) 49-51 MG Take 1 tablet by mouth 2 (two) times daily. Please attend scheduled appointment for additional refills. 03/11/22   Lyn Records, MD  Semaglutide,0.25 or 0.5MG /DOS, (OZEMPIC, 0.25 OR 0.5 MG/DOSE,) 2 MG/1.5ML SOPN Inject 0.5 mg into the skin once a week.    [provider]  tiZANidine (ZANAFLEX) 2 MG tablet 2 mg 3 (three) times daily as needed for muscle spasms. 03/03/22   [provider]  VITAMIN B COMPLEX-C PO Take 1 tablet by mouth daily.    [provider]  Wound Dressings (HYDROFERA BLUE 4"X4") PADS Apply 1 Application topically daily. 03/17/22   Adonis Huguenin, NP      Allergies    Ticagrelor  Review of Systems   Review of Systems  Physical Exam Updated Vital Signs BP (!) 146/100 (BP Location: Left Arm)   Pulse 64   Temp 97.9 F (36.6 C)   Resp 18   Ht 6' (1.829 m)   Wt 99.8 kg   SpO2 95%   BMI 29.84 kg/m  Physical Exam Vitals and nursing note reviewed.  Constitutional:      General: He is not in acute distress.    Appearance: He is well-developed.  HENT:     Head: Normocephalic and atraumatic.  Eyes:     Conjunctiva/sclera: Conjunctivae normal.  Cardiovascular:     Rate and Rhythm: Normal rate and regular rhythm.  Pulmonary:     Effort: Pulmonary effort is normal. No respiratory  distress.     Breath sounds: No stridor. Decreased breath sounds present.  Abdominal:     General: There is no distension.     Comments: Protuberant, nontender abdomen  Musculoskeletal:     Left lower leg: Edema present.     Comments: Addendum.  Right foot with wound wrapping in place, no proximal erythema, warmth, pulses appropriate.  Dressing is new, and after discussion with the patient's wife was left in place.  Skin:    General: Skin is warm and dry.  Neurological:     Mental Status: He is alert and oriented to person, place, and time.     ED Results / Procedures / Treatments   Labs (all labs ordered are listed, but only abnormal results are displayed) Labs Reviewed  BASIC METABOLIC PANEL - Abnormal; Notable for the following components:      Result Value   Glucose, Bld 125 (*)    Creatinine, Ser 1.61 (*)    GFR, Estimated 41 (*)    All other components within normal limits  CBC - Abnormal; Notable for the following components:   Hemoglobin 12.8 (*)    All other components within normal limits  BRAIN NATRIURETIC PEPTIDE - Abnormal; Notable for the following components:   B Natriuretic Peptide 612.7 (*)    All other components within normal limits  TROPONIN I (HIGH SENSITIVITY) - Abnormal; Notable for the following components:   Troponin I (High Sensitivity) 55 (*)    All other components within normal limits  TROPONIN I (HIGH SENSITIVITY) - Abnormal; Notable for the following components:   Troponin I (High Sensitivity) 118 (*)    All other components within normal limits    EKG EKG Interpretation Date/Time:  Tuesday March 09 2023 05:28:12 EST Ventricular Rate:  69 PR Interval:  222 QRS Duration:  104 QT Interval:  456 QTC Calculation: 488 R Axis:   89  Text Interpretation: Sinus rhythm with 1st degree A-V block with occasional Premature ventricular complexes and Premature atrial complexes Incomplete right bundle branch block Anteroseptal infarct , age  undetermined Abnormal ECG When compared with ECG of 28-Feb-2023 09:31, PREVIOUS ECG IS PRESENT Confirmed by Gerhard Munch [phone]) on 03/09/2023 11:22:51 AM  Radiology DG Chest 2 View  Result Date: 03/09/2023 CLINICAL DATA:  89 year old male with history of shortness of breath. EXAM: CHEST - 2 VIEW COMPARISON:  Chest x-ray 02/28/2023. FINDINGS: Lung volumes are low. There are bibasilar opacities (left-greater-than-right), which may reflect areas of atelectasis and/or consolidation. Small left pleural effusion. No definite right pleural effusion. No pneumothorax. No evidence of pulmonary edema. Heart size is normal. Upper mediastinal contours are within normal limits. Atherosclerotic calcifications are noted in the thoracic aorta. Status post TAVR. IMPRESSION: 1. Low  lung volumes with bibasilar areas of atelectasis and/or consolidation with small left pleural effusion. 2. Aortic atherosclerosis. Electronically Signed   By: Trudie Reed M.D.   On: 03/09/2023 06:04    Procedures Procedures    Medications Ordered in ED Medications  furosemide (LASIX) injection 80 mg (has no administration in time range)    ED Course/ Medical Decision Making/ A&P                                 Medical Decision Making Adult male with history of multiple medical issues including heart failure, hypertension, now presents with anasarca.  Patient has no oxygen requirement but does have tachypnea, decreased lung sounds and physical exam concerning for worsening heart failure. No early evidence for infection.  Cardiac 65 sinus normal Pulse ox 95% room air borderline  Amount and/or Complexity of Data Reviewed Independent Historian: spouse External Data Reviewed: notes. Labs: ordered. Decision-making details documented in ED Course. Radiology: ordered and independent interpretation performed. Decision-making details documented in ED Course. ECG/medicine tests: ordered and independent interpretation  performed. Decision-making details documented in ED Course.  Risk Prescription drug management.  Echo from 2022 reviewed, systolic and diastolic dysfunction.  11:24 AM Patient in similar condition.  Labs notable for BNP 6 fold higher than most recent value, and troponin with positive delta as well as persistent renal dysfunction.  Given concern for acute heart failure exacerbation patient will receive IV diuretics, he has no chest pain, EKG is not on ischemic, this is troponin elevation is likely secondary to his metabolic demand.  With IV diuresis patient will require admission for further monitoring, management.  Final Clinical Impression(s) / ED Diagnoses Final diagnoses:  Acute on chronic combined systolic and diastolic congestive heart failure (HCC)   CRITICAL CARE Performed by: Gerhard Munch Total critical care time: 35 minutes Critical care time was exclusive of separately billable procedures and treating other patients. Critical care was necessary to treat or prevent imminent or life-threatening deterioration. Critical care was time spent personally by me on the following activities: development of treatment plan with patient and/or surrogate as well as nursing, discussions with consultants, evaluation of patient's response to treatment, examination of patient, obtaining history from patient or surrogate, ordering and performing treatments and interventions, ordering and review of laboratory studies, ordering and review of radiographic studies, pulse oximetry and re-evaluation of patient's condition.    Gerhard Munch, MD 03/09/23 1126    Gerhard Munch, MD 03/09/23 [phone]

## 2023-03-10 ENCOUNTER — Observation Stay (HOSPITAL_COMMUNITY): Payer: Medicare HMO

## 2023-03-10 ENCOUNTER — Encounter (HOSPITAL_COMMUNITY): Payer: Self-pay | Admitting: Internal Medicine

## 2023-03-10 DIAGNOSIS — I130 Hypertensive heart and chronic kidney disease with heart failure and stage 1 through stage 4 chronic kidney disease, or unspecified chronic kidney disease: Secondary | ICD-10-CM | POA: Diagnosis not present

## 2023-03-10 DIAGNOSIS — R7989 Other specified abnormal findings of blood chemistry: Secondary | ICD-10-CM | POA: Diagnosis not present

## 2023-03-10 DIAGNOSIS — I272 Pulmonary hypertension, unspecified: Secondary | ICD-10-CM | POA: Diagnosis not present

## 2023-03-10 DIAGNOSIS — Z87891 Personal history of nicotine dependence: Secondary | ICD-10-CM | POA: Diagnosis not present

## 2023-03-10 DIAGNOSIS — I428 Other cardiomyopathies: Secondary | ICD-10-CM | POA: Diagnosis not present

## 2023-03-10 DIAGNOSIS — N1832 Chronic kidney disease, stage 3b: Secondary | ICD-10-CM | POA: Diagnosis not present

## 2023-03-10 DIAGNOSIS — Z89431 Acquired absence of right foot: Secondary | ICD-10-CM | POA: Diagnosis not present

## 2023-03-10 DIAGNOSIS — I493 Ventricular premature depolarization: Secondary | ICD-10-CM | POA: Diagnosis not present

## 2023-03-10 DIAGNOSIS — I21A1 Myocardial infarction type 2: Secondary | ICD-10-CM | POA: Diagnosis not present

## 2023-03-10 DIAGNOSIS — I5043 Acute on chronic combined systolic (congestive) and diastolic (congestive) heart failure: Secondary | ICD-10-CM | POA: Diagnosis not present

## 2023-03-10 DIAGNOSIS — I214 Non-ST elevation (NSTEMI) myocardial infarction: Secondary | ICD-10-CM | POA: Diagnosis not present

## 2023-03-10 DIAGNOSIS — Z23 Encounter for immunization: Secondary | ICD-10-CM | POA: Diagnosis not present

## 2023-03-10 DIAGNOSIS — I350 Nonrheumatic aortic (valve) stenosis: Secondary | ICD-10-CM | POA: Diagnosis not present

## 2023-03-10 DIAGNOSIS — I700 Atherosclerosis of aorta: Secondary | ICD-10-CM | POA: Diagnosis not present

## 2023-03-10 DIAGNOSIS — J45909 Unspecified asthma, uncomplicated: Secondary | ICD-10-CM | POA: Diagnosis not present

## 2023-03-10 DIAGNOSIS — Z7984 Long term (current) use of oral hypoglycemic drugs: Secondary | ICD-10-CM | POA: Diagnosis not present

## 2023-03-10 DIAGNOSIS — E1140 Type 2 diabetes mellitus with diabetic neuropathy, unspecified: Secondary | ICD-10-CM | POA: Diagnosis not present

## 2023-03-10 DIAGNOSIS — Z79899 Other long term (current) drug therapy: Secondary | ICD-10-CM | POA: Diagnosis not present

## 2023-03-10 DIAGNOSIS — I2510 Atherosclerotic heart disease of native coronary artery without angina pectoris: Secondary | ICD-10-CM | POA: Diagnosis not present

## 2023-03-10 DIAGNOSIS — Z7982 Long term (current) use of aspirin: Secondary | ICD-10-CM | POA: Diagnosis not present

## 2023-03-10 DIAGNOSIS — R0600 Dyspnea, unspecified: Secondary | ICD-10-CM | POA: Diagnosis not present

## 2023-03-10 DIAGNOSIS — I440 Atrioventricular block, first degree: Secondary | ICD-10-CM | POA: Diagnosis not present

## 2023-03-10 DIAGNOSIS — I509 Heart failure, unspecified: Secondary | ICD-10-CM | POA: Diagnosis not present

## 2023-03-10 DIAGNOSIS — Q245 Malformation of coronary vessels: Secondary | ICD-10-CM | POA: Diagnosis not present

## 2023-03-10 DIAGNOSIS — E1122 Type 2 diabetes mellitus with diabetic chronic kidney disease: Secondary | ICD-10-CM | POA: Diagnosis not present

## 2023-03-10 DIAGNOSIS — I5021 Acute systolic (congestive) heart failure: Secondary | ICD-10-CM | POA: Diagnosis not present

## 2023-03-10 DIAGNOSIS — Z7902 Long term (current) use of antithrombotics/antiplatelets: Secondary | ICD-10-CM | POA: Diagnosis not present

## 2023-03-10 DIAGNOSIS — E1151 Type 2 diabetes mellitus with diabetic peripheral angiopathy without gangrene: Secondary | ICD-10-CM | POA: Diagnosis not present

## 2023-03-10 DIAGNOSIS — E785 Hyperlipidemia, unspecified: Secondary | ICD-10-CM | POA: Diagnosis not present

## 2023-03-10 LAB — BASIC METABOLIC PANEL
Anion gap: 8 (ref 5–15)
BUN: 24 mg/dL — ABNORMAL HIGH (ref 8–23)
CO2: 24 mmol/L (ref 22–32)
Calcium: 8.8 mg/dL — ABNORMAL LOW (ref 8.9–10.3)
Chloride: 108 mmol/L (ref 98–111)
Creatinine, Ser: 1.61 mg/dL — ABNORMAL HIGH (ref 0.61–1.24)
GFR, Estimated: 41 mL/min — ABNORMAL LOW (ref >60)
Glucose, Bld: 111 mg/dL — ABNORMAL HIGH (ref 70–99)
Potassium: 3.6 mmol/L (ref 3.5–5.1)
Sodium: 140 mmol/L (ref 135–145)

## 2023-03-10 LAB — PHOSPHORUS: Phosphorus: 3.5 mg/dL (ref 2.5–4.6)

## 2023-03-10 LAB — HEPARIN LEVEL (UNFRACTIONATED): Heparin Unfractionated: 0.7 [IU]/mL (ref 0.30–0.70)

## 2023-03-10 LAB — CBC
HCT: 38 % — ABNORMAL LOW (ref 39.0–52.0)
Hemoglobin: 12.3 g/dL — ABNORMAL LOW (ref 13.0–17.0)
MCH: 29.2 pg (ref 26.0–34.0)
MCHC: 32.4 g/dL (ref 30.0–36.0)
MCV: 90.3 fL (ref 80.0–100.0)
Platelets: UNDETERMINED 10*3/uL (ref 150–400)
RBC: 4.21 MIL/uL — ABNORMAL LOW (ref 4.22–5.81)
RDW: 13.7 % (ref 11.5–15.5)
WBC: 8.7 10*3/uL (ref 4.0–10.5)
nRBC: 0 % (ref 0.0–0.2)

## 2023-03-10 LAB — GLUCOSE, CAPILLARY
Glucose-Capillary: 114 mg/dL — ABNORMAL HIGH (ref 70–99)
Glucose-Capillary: 114 mg/dL — ABNORMAL HIGH (ref 70–99)
Glucose-Capillary: 107 mg/dL — ABNORMAL HIGH (ref 70–99)
Glucose-Capillary: 145 mg/dL — ABNORMAL HIGH (ref 70–99)

## 2023-03-10 LAB — MAGNESIUM: Magnesium: 2.4 mg/dL (ref 1.7–2.4)

## 2023-03-10 LAB — HEMOGLOBIN A1C
Hgb A1c MFr Bld: 6.4 % — ABNORMAL HIGH (ref 4.8–5.6)
Mean Plasma Glucose: 136.98 mg/dL

## 2023-03-10 LAB — TROPONIN I (HIGH SENSITIVITY): Troponin I (High Sensitivity): 1325 ng/L (ref <18)

## 2023-03-10 MED ORDER — POTASSIUM CHLORIDE CRYS ER 20 MEQ PO TBCR
40.0000 meq | EXTENDED_RELEASE_TABLET | Freq: Once | ORAL | Status: AC
  Administered 2023-03-10: 40 meq via ORAL
  Filled 2023-03-10: qty 2

## 2023-03-10 MED ORDER — FERROUS SULFATE 325 (65 FE) MG PO TABS
325.0000 mg | ORAL_TABLET | Freq: Every day | ORAL | Status: DC
  Administered 2023-03-11 – 2023-03-12 (×2): 325 mg via ORAL
  Filled 2023-03-10 (×2): qty 1

## 2023-03-10 MED ORDER — CLOPIDOGREL BISULFATE 75 MG PO TABS
75.0000 mg | ORAL_TABLET | Freq: Every day | ORAL | Status: DC
  Administered 2023-03-10 – 2023-03-12 (×3): 75 mg via ORAL
  Filled 2023-03-10 (×3): qty 1

## 2023-03-10 MED ORDER — HEPARIN (PORCINE) 25000 UT/250ML-% IV SOLN
1300.0000 [IU]/h | INTRAVENOUS | Status: DC
  Administered 2023-03-10: 1300 [IU]/h via INTRAVENOUS
  Filled 2023-03-10 (×2): qty 250

## 2023-03-10 MED ORDER — TECHNETIUM TO 99M ALBUMIN AGGREGATED
4.2000 | Freq: Once | INTRAVENOUS | Status: AC | PRN
  Administered 2023-03-10: 4.2 via INTRAVENOUS

## 2023-03-10 MED ORDER — ROSUVASTATIN CALCIUM 20 MG PO TABS
40.0000 mg | ORAL_TABLET | Freq: Every day | ORAL | Status: DC
  Administered 2023-03-10 – 2023-03-12 (×3): 40 mg via ORAL
  Filled 2023-03-10 (×3): qty 2

## 2023-03-10 MED ORDER — ASPIRIN 81 MG PO CHEW
81.0000 mg | CHEWABLE_TABLET | ORAL | Status: AC
  Administered 2023-03-11: 81 mg via ORAL
  Filled 2023-03-10: qty 1

## 2023-03-10 MED ORDER — HEPARIN BOLUS VIA INFUSION
3000.0000 [IU] | Freq: Once | INTRAVENOUS | Status: AC
  Administered 2023-03-10: 3000 [IU] via INTRAVENOUS
  Filled 2023-03-10: qty 3000

## 2023-03-10 MED ORDER — EMPAGLIFLOZIN 25 MG PO TABS: 25.0000 mg | ORAL_TABLET | Freq: Every day | ORAL | Status: DC

## 2023-03-10 MED ORDER — ASPIRIN 81 MG PO TBEC
81.0000 mg | DELAYED_RELEASE_TABLET | Freq: Every day | ORAL | Status: DC
  Administered 2023-03-10 – 2023-03-12 (×2): 81 mg via ORAL
  Filled 2023-03-10 (×3): qty 1

## 2023-03-10 NOTE — Consult Note (Signed)
WOC Nurse Consult Note: Reason for Consult: Right stump debridement and partial bone resection on 11/27.  Was last seen by podiatry on 12/2 and wound care guidance was given .  Will continue this as ordered.   Wound type: Neuropathc Pressure Injury POA: NA Measurement: Right distal transmetatarsal amputation with revision 02/24/23.  Dressing procedure/placement/frequency: AS ordered by podiatry.  Bedside RN to perform:  Wound Care Instructions  1). Paint incision(s) with Betadine and let dry. 2). Cover with dry gauze. 3). Secure with dry gauze roll and tape. 4). Change dressing 2-3 days or as needed for drainage or dislodgement.  Will not follow at this time.  Please re-consult if needed.  Mike Gip MSN, RN, FNP-BC CWON Wound, Ostomy, Continence Nurse Outpatient [facility] [phone] Pager [phone]

## 2023-03-10 NOTE — Progress Notes (Signed)
   Heart Failure Stewardship Pharmacist Progress Note   PCP: Renford Dills, MD PCP-Cardiologist: Chilton Si, MD    HPI:  89 yo M with PMH of CHF, CAD, aortic stenosis s/p TAVR, T2DM, HTN, HLD, PAD, CKD, colon cancer s/p colectomy, and recent partial foot amputation.   Presented to the ED on 12/10 with shortness of breath, LE edema, and weakness. He was instructed to hold his lasix a few days around surgery. CXR with bibasilar atelectasis and small left pleural effusion. ECHO 12/10 with LVEF 25-30% (was 45-50% in 2022), G3DD, RV low normal, mild MR. Scheduled for R/LHC on 12/12.   Patient denies shortness of breath. Reports that he was off lasix several days around the surgery because of scheduling issues. Otherwise, his fluid has been well managed for some time.  Current HF Medications: Diuretic: furosemide 40 mg IV BID Beta Blocker: carvedilol 3.125 mg BID ACE/ARB/ARNI: Entresto 49/51 mg BID  Prior to admission HF Medications: Diuretic: furosemide 40 mg daily Beta blocker: carvedilol 3.125 mg BID ACE/ARB/ARNI: Entresto 49/51 mg BID SGLT2i: Jardiance 25 mg daily  Pertinent Lab Values: Serum creatinine 1.61, BUN 24, Potassium 3.6, Sodium 140, BNP 612.7, Magnesium 2.4, A1c 6.4   Vital Signs: Weight: 218 lbs (admission weight: 220 lbs) Blood pressure: 130-150/80s  Heart rate: 70s  I/O: net -2.1L yesterday; net -2.3L since admission  Medication Assistance / Insurance Benefits Check: Does the patient have prescription insurance?  Yes Type of insurance plan: [employer] Medicare  Outpatient Pharmacy:  Prior to admission outpatient [facility] Is the patient willing to use [facility] TOC pharmacy at discharge? Yes Is the patient willing to transition their outpatient pharmacy to utilize a [facility] outpatient pharmacy?   Pending    Assessment: 1. Acute on chronic systolic CHF (LVEF 25-30%), due to NICM. NYHA class III symptoms. - Continue furosemide 40 mg IV BID. Strict I/Os and  daily weights. Keep K>4 and Mg>2. - Continue carvedilol 3.125 mg BID - Continue Entresto 49/51 mg BID - Consider starting spironolactone 25 mg daily tomorrow after cath - Recommend to restart Jardiance prior to discharge   Plan: 1) Medication changes recommended at this time: - Start spironolactone 25 mg daily tomorrow after cath  2) Patient assistance: - None pending - patient states his wife would know more about medication costs but she has left for the day  3)  Education  - Patient has been educated on current HF medications and potential additions to HF medication regimen - Patient verbalizes understanding that over the next few months, these medication doses may change and more medications may be added to optimize HF regimen - Patient has been educated on basic disease state pathophysiology and goals of therapy   Sharen Hones, PharmD, BCPS Heart Failure Engineer, building services Phone [phone]

## 2023-03-10 NOTE — Progress Notes (Signed)
PHARMACY - ANTICOAGULATION CONSULT NOTE  Pharmacy Consult for IV  heparin Indication: R/o PE, r/o ACS  Allergies  Allergen Reactions   Ticagrelor Other (See Comments)    Unknown     Patient Measurements: Height: 6' (182.9 cm) Weight: 99 kg (218 lb 4.1 oz) IBW/kg (Calculated) : 77.6 Heparin Dosing Weight: 99 kg  Vital Signs: Temp: 97.7 F (36.5 C) (12/11 2102) Temp Source: Oral (12/11 2102) BP: 115/75 (12/11 2102) Pulse Rate: 64 (12/11 2102)  Labs: Recent Labs    03/09/23 0526 03/09/23 0924 03/10/23 0242 03/10/23 2121  HGB 12.8*  --  12.3*  --   HCT 40.6  --  38.0*  --   PLT 177  --  PLATELET CLUMPS NOTED ON SMEAR, UNABLE TO ESTIMATE  --   HEPARINUNFRC  --   --   --  0.70  CREATININE 1.61*  --  1.61*  --   TROPONINIHS 55* 118* 1,325*  --     Estimated Creatinine Clearance: 39.4 mL/min (A) (by C-G formula based on SCr of 1.61 mg/dL (H)).   Assessment: 89 yo male admitted with SOB, troponins elevated and pharmacy asked to start anticoagulation with IV heparin.  Pt received lovenox 40 mg sq this AM at 0816 am.  No anticoagulants noted PTA.  No bleeding or complications noted.  Heparin level 0.7 (therapeutic) on infusion at 1300 units/hr. No issues with bleeding.  Goal of Therapy:  Heparin level 0.3-0.7 units/ml Monitor platelets by anticoagulation protocol: Yes   Plan:  Continue heparin drip at 1300 units/hr F/u a.m. heparin level to confirm therapeutic  Reece Leader, Pharm D, BCPS, BCCP Christoper Fabian, PharmD, BCPS Please see amion for complete clinical pharmacist phone list  03/10/2023 10:10 PM

## 2023-03-10 NOTE — Progress Notes (Signed)
PHARMACY - ANTICOAGULATION CONSULT NOTE  Pharmacy Consult for IV  heparin Indication: R/o PE, r/o ACS  Allergies  Allergen Reactions   Ticagrelor Other (See Comments)    Unknown     Patient Measurements: Height: 6' (182.9 cm) Weight: 99 kg (218 lb 4.1 oz) IBW/kg (Calculated) : 77.6 Heparin Dosing Weight: 99 kg  Vital Signs: Temp: 98.3 F (36.8 C) (12/11 0745) Temp Source: Oral (12/11 0745) BP: 107/93 (12/11 1225) Pulse Rate: 72 (12/11 1225)  Labs: Recent Labs    03/09/23 0526 03/09/23 0924 03/10/23 0242  HGB 12.8*  --  12.3*  HCT 40.6  --  38.0*  PLT 177  --  PLATELET CLUMPS NOTED ON SMEAR, UNABLE TO ESTIMATE  CREATININE 1.61*  --  1.61*  TROPONINIHS 55* 118* 1,325*    Estimated Creatinine Clearance: 39.4 mL/min (A) (by C-G formula based on SCr of 1.61 mg/dL (H)).   Medical History: Past Medical History:  Diagnosis Date   Anemia    Anxiety    situational- surgery   Asthma    as a child   Cancer (HCC) 2019   colon- colectomy    CHF (congestive heart failure) (HCC)    Chronic kidney disease    followed by Dr. Nehemiah Settle   Coronary artery disease    Diabetes mellitus without complication (HCC)    Type II   Dyslipidemia 10/27/2015   Dyspnea    w/ exertion    Elevated PSA 10/27/2015   Erectile dysfunction 10/27/2015   Heart murmur    Hypertension 10/27/2015   Hypogonadism male 10/27/2015   Neuropathy    Obesity 10/27/2015   Peripheral vascular disease (HCC)    Pneumonia 10/27/2015   pt states was 1982   Rotator cuff tear 10/27/2015    Medications:  Infusions:   heparin      Assessment: 89 yo male admitted with SOB, troponins elevated and pharmacy asked to start anticoagulation with IV heparin.  Pt received lovenox 40 mg sq this AM at 0816 am.  No anticoagulants noted PTA.  No bleeding or complications noted.  Goal of Therapy:  Heparin level 0.3-0.7 units/ml Monitor platelets by anticoagulation protocol: Yes   Plan:  Start heparin with small  bolus of 3000 units x 1 (in light of lovenox dose this AM) Then start heparin drip at 1300 units/hr Check heparin level 8 hrs after drip starts Daily heparin level and CBC.  Reece Leader, Colon Flattery, BCCP Clinical Pharmacist  03/10/2023 12:43 PM   [facility] pharmacy phone numbers are listed on [email or web address] c

## 2023-03-10 NOTE — H&P (View-Only) (Signed)
Cardiology Consultation   Patient ID: Robert Horn MRN: [id number]; DOB: [birth year]  Admit date: 03/09/2023 Date of Consult: 03/10/2023  PCP:  Renford Dills, MD   [facility] Providers Cardiologist:  Chilton Si, MD   {  Patient Profile:   Robert Horn is a 89 y.o. male with a hx of nonischemic cardiomyopathy, chronic heart failure with mildly reduced EF, TAVR 2018, PAD status post partial right foot amputations, coronary calcifications with patent coronary arteries on catheterization 2017, TIA 02/2021, CKD, diabetes, colon cancer status post colectomy hypertension, hyperlipidemia, who is being seen 03/10/2023 for the evaluation of shortness of breath at the request of Dr. Pola Corn.  History of Present Illness:   Robert Horn was previously followed by Dr.  Eldridge Dace, but now will be followed by Dr. Duke Salvia.  Has history of reduced EF 25 to 30% in the setting of mod-severe aortic stenosis.  He had a left heart cath that widely patent normal coronary arteries.  Mild pulmonary hypertension.  Showed approximately 1 month after TAVR echo showed improved EF 45 to 50% that has remained stable since last echo in 2022.  In the past he was followed by Dr. Katrinka Blazing for his PAD with previous right SFA angioplasty and stent placement in November 2020.  He has toe amputations secondary to osteomyelitis/PAD in November 2020, May 2021.  Recently had right stump debridement and partial bone resection on 02/24/2023.  He has been chronically managed on aspirin and Plavix.  Currently patient is being evaluated with worsening shortness of breath fatigue that started approximately around his recent toe revision surgery.  He states that he had to cancel his appointment 3 times and [facility] was holding his heart failure medications including his Lasix for multiple days.  He came to the emergency room on 02/28/2023 he was given IV Lasix and discharged home with recommendations to follow-up with PCP.   Since this him and his wife report that he has had persistent shortness of breath and fatigue after being requested to hold his heart failure medications.  Last night he had an episode of significant shortness of breath that occurred during the middle of the night that prompted today's admission.  Cardiology has been asked to further evaluate this as well as sudden reduced EF 25 to 30%, global hypokinesis with grade 3 diastolic dysfunction and interventricular septum flattening.  Low normal RV function.  Patient currently being transferred downstairs to perform VQ scan to assess for PE.  Per wife patient has done extremely well from heart failure standpoint throughout these years.  States that he is very ambulatory and independent without any prior symptoms of shortness of breath, orthopnea, peripheral edema until more recently surrounding his toe revision surgery.  Currently he feels okay.  Does not have any chest pain, denies significant shortness of breath currently.  He reports minor improvement since starting IV Lasix.  Patient is well-appearing, no acute distress.  Vital signs stable, slightly elevated blood pressure.  Not tachycardic.  BNP 600+, troponins [id number].  EKG showing no acute ST-T wave changes, inferior Q waves.  Chest x-ray showing low lung volumes with bibasilar atelectasis and/or consolidation with small left pleural effusion.  Creatinine 1.61.  Hemoglobin 12.3.  A1c 6.4%.   Past Medical History:  Diagnosis Date   Anemia    Anxiety    situational- surgery   Asthma    as a child   Cancer (HCC) 2019   colon- colectomy    CHF (congestive heart failure) (HCC)  Chronic kidney disease    followed by Dr. Nehemiah Settle   Coronary artery disease    Diabetes mellitus without complication (HCC)    Type II   Dyslipidemia 10/27/2015   Dyspnea    w/ exertion    Elevated PSA 10/27/2015   Erectile dysfunction 10/27/2015   Heart murmur    Hypertension 10/27/2015   Hypogonadism male  10/27/2015   Neuropathy    Obesity 10/27/2015   Peripheral vascular disease (HCC)    Pneumonia 10/27/2015   pt states was 1982   Rotator cuff tear 10/27/2015    Past Surgical History:  Procedure Laterality Date   ABDOMINAL AORTOGRAM N/A 02/01/2019   Procedure: ABDOMINAL AORTOGRAM;  Surgeon: Cephus Shelling, MD;  Location: [facility] INVASIVE CV LAB;  Service: Cardiovascular;  Laterality: N/A;   AMPUTATION Right 02/03/2019   Procedure: RIGHT FOOT 1ST RAY AMPUTATION;  Surgeon: Nadara Mustard, MD;  Location[facility] OR;  Service: Orthopedics;  Laterality: Right;   AMPUTATION Right 08/02/2019   Procedure: RIGHT 2ND TOE AMPUTATION;  Surgeon: Nadara Mustard, MD;  Location: [facility] OR;  Service: Orthopedics;  Laterality: Right;   AMPUTATION Right 11/08/2020   Procedure: RIGHT TRANSMETATARSAL AMPUTATION;  Surgeon: Nadara Mustard, MD;  Location: [facility] OR;  Service: Orthopedics;  Laterality: Right;   APPLICATION OF WOUND VAC  11/08/2020   Procedure: APPLICATION OF WOUND VAC;  Surgeon: Nadara Mustard, MD;  Location: [facility] OR;  Service: Orthopedics;;   CARDIAC CATHETERIZATION N/A 11/14/2015   Procedure: Left Heart Cath and Coronary Angiography;  Surgeon: Lyn Records, MD;  Location: [facility] INVASIVE CV LAB;  Service: Cardiovascular;  Laterality: N/A;   COLONOSCOPY     LAPAROSCOPIC PARTIAL COLECTOMY  12/15/2017   LAPAROSCOPIC PARTIAL COLECTOMY (N/A Abdomen)   LAPAROSCOPIC PARTIAL COLECTOMY N/A 12/15/2017   Procedure: LAPAROSCOPIC PARTIAL COLECTOMY;  Surgeon: Harriette Bouillon, MD;  Location: [facility] OR;  Service: General;  Laterality: N/A;   LOWER EXTREMITY ANGIOGRAPHY Right 02/01/2019   Procedure: LOWER EXTREMITY ANGIOGRAPHY;  Surgeon: Cephus Shelling, MD;  Location: [facility] INVASIVE CV LAB;  Service: Cardiovascular;  Laterality: Right;   PERIPHERAL VASCULAR INTERVENTION Right 02/01/2019   Procedure: PERIPHERAL VASCULAR INTERVENTION;  Surgeon: Cephus Shelling, MD;  Location: [facility] INVASIVE CV LAB;  Service: Cardiovascular;   Laterality: Right;  SFA   TEE WITHOUT CARDIOVERSION N/A 12/15/2016   Procedure: TRANSESOPHAGEAL ECHOCARDIOGRAM (TEE);  Surgeon: Tonny Bollman, MD;  Location: [facility] OR;  Service: Open Heart Surgery;  Laterality: N/A;   TRANSCATHETER AORTIC VALVE REPLACEMENT, TRANSFEMORAL N/A 12/15/2016   Procedure: TRANSCATHETER AORTIC VALVE REPLACEMENT, TRANSFEMORAL;  Surgeon: Tonny Bollman, MD;  Location: [facility] OR;  Service: Open Heart Surgery;  Laterality: N/A;     Inpatient Medications: Scheduled Meds:  aspirin EC  81 mg Oral Daily   carvedilol  3.125 mg Oral BID WC   clopidogrel  75 mg Oral Daily   enoxaparin (LOVENOX) injection  40 mg Subcutaneous Q24H   [START ON 03/11/2023] ferrous sulfate  325 mg Oral Q breakfast   furosemide  40 mg Intravenous BID   insulin aspart  0-5 Units Subcutaneous QHS   insulin aspart  0-9 Units Subcutaneous TID WC   pneumococcal 20-valent conjugate vaccine  0.5 mL Intramuscular Tomorrow-1000   rosuvastatin  40 mg Oral Daily   sacubitril-valsartan  1 tablet Oral BID   senna  1 tablet Oral BID   Continuous Infusions:  PRN Meds: acetaminophen **OR** acetaminophen, albuterol, bisacodyl, hydrALAZINE, polyethylene glycol  Allergies:    Allergies  Allergen Reactions  Ticagrelor Other (See Comments)    Unknown     Social History:   Social History   Socioeconomic History   Marital status: Married    Spouse name: Alice   Number of children: 2   Years of education: Not on file   Highest education level: Master's degree (e.g., MA, MS, MEng, MEd, MSW, MBA)  Occupational History   Occupation: Part-time  Tobacco Use   Smoking status: Former    Types: Cigarettes    Passive exposure: Never   Smokeless tobacco: Never  Vaping Use   Vaping status: Never Used  Substance and Sexual Activity   Alcohol use: Yes    Comment: rarely   Drug use: No   Sexual activity: Not on file  Other Topics Concern   Not on file  Social History Narrative   Not on file   Social  Determinants of Health   Financial Resource Strain: Low Risk  (03/10/2023)   Overall Financial Resource Strain (CARDIA)    Difficulty of Paying Living Expenses: Not hard at all  Food Insecurity: No Food Insecurity (03/09/2023)   Hunger Vital Sign    Worried About Running Out of Food in the Last Year: Never true    Ran Out of Food in the Last Year: Never true  Transportation Needs: No Transportation Needs (03/09/2023)   PRAPARE - Administrator, Civil Service (Medical): No    Lack of Transportation (Non-Medical): No  Physical Activity: Not on file  Stress: Not on file  Social Connections: Not on file  Intimate Partner Violence: Not At Risk (03/09/2023)   Humiliation, Afraid, Rape, and Kick questionnaire    Fear of Current or Ex-Partner: No    Emotionally Abused: No    Physically Abused: No    Sexually Abused: No    Family History:   Family History  Problem Relation Age of Onset   Diabetes Mother    Heart disease Mother    Pulmonary embolism Father      ROS:  Please see the history of present illness.  All other ROS reviewed and negative.     Physical Exam/Data:   Vitals:   03/09/23 2245 03/10/23 0034 03/10/23 0453 03/10/23 0745  BP: (!) 106/92  130/84 (!) 151/87  Pulse: 81  69 79  Resp: 16  17 18   Temp:   98.3 F (36.8 C) 98.3 F (36.8 C)  TempSrc:   Oral Oral  SpO2: 100%  93% 93%  Weight:  99 kg    Height:  6' (1.829 m)      Intake/Output Summary (Last 24 hours) at 03/10/2023 1204 Last data filed at 03/10/2023 0456 Gross per 24 hour  Intake --  Output 2325 ml  Net -2325 ml      03/10/2023   12:34 AM 03/09/2023    5:19 AM 02/28/2023    9:25 AM  Last 3 Weights  Weight (lbs) 218 lb 4.1 oz 220 lb 224 lb  Weight (kg) 99 kg 99.791 kg 101.606 kg     Body mass index is 29.6 kg/m.  General:  Well nourished, well developed, in no acute distress HEENT: normal Neck: no JVD Vascular: No carotid bruits; Distal pulses 2+ bilaterally Cardiac:  normal  S1, S2; RRR; no murmur  Lungs:  clear to auscultation bilaterally, no wheezing, rhonchi or rales  Abd: soft, nontender, no hepatomegaly  Ext: no edema Musculoskeletal:  No deformities, BUE and BLE strength normal and equal Skin: warm and dry  Neuro:  CNs 2-12 intact, no focal abnormalities noted Psych:  Normal affect   EKG:  The EKG was personally reviewed and demonstrates: Sinus rhythm with first-degree AV block, PR 228.  Q waves in lead III.  Subtle ST elevation in V3, may be repolarization.  No acute ST-T wave changes.  This was a repeat EKG that does not show significant changes from yesterday's. Telemetry:  Telemetry was personally reviewed and demonstrates: Sinus rhythm, 70s 80s.  Occasional PVCs.  Relevant CV Studies: Echocardiogram 03/09/2023  1. Left ventricular ejection fraction, by estimation, is 25 to 30%. Left  ventricular ejection fraction by 3D volume is 31 %. The left ventricle has  severely decreased function. The left ventricle demonstrates global  hypokinesis. The left ventricular  internal cavity size was mildly dilated. Left ventricular diastolic  parameters are consistent with Grade III diastolic dysfunction  (restrictive). There is the interventricular septum is flattened in  systole and diastole, consistent with right ventricular   pressure and volume overload.   2. Right ventricular systolic function is low normal. The right  ventricular size is moderately enlarged. Mildly increased right  ventricular wall thickness.   3. Left atrial size was moderately dilated.   4. Right atrial size was moderately dilated.   5. There is no evidence of cardiac tamponade.   6. The mitral valve is degenerative. Mild mitral valve regurgitation.   7. The aortic valve has been repaired/replaced. Aortic valve  regurgitation is not visualized. There is a 26 mm Edwards Sapien  prosthetic (TAVR) valve present in the aortic position. Procedure Date:  12/15/2016. Aortic valve mean  gradient measures 14.3  mmHg. Aortic valve Vmax measures 2.58 m/s. Aortic valve acceleration time  measures 67 msec.   8. Aortic dilatation noted. There is dilatation of the ascending aorta,  measuring 38 mm. There is dilatation of the ascending aorta, measuring 38  mm.   Comparison(s): Prior images reviewed side by side. The left ventricular  function is significantly worse. The left ventricular diastolic function  is significantly worse. TAVR gradients are lower due to reduction in LV  function.    Laboratory Data:  High Sensitivity Troponin:   Recent Labs  Lab 02/28/23 0951 02/28/23 1223 03/09/23 0526 03/09/23 0924 03/10/23 0242  TROPONINIHS 88* 81* 55* 118* 1,325*     Chemistry Recent Labs  Lab 03/09/23 0526 03/10/23 0242  NA 140 140  K 3.6 3.6  CL 106 108  CO2 24 24  GLUCOSE 125* 111*  BUN 22 24*  CREATININE 1.61* 1.61*  CALCIUM 9.0 8.8*  MG  --  2.4  GFRNONAA 41* 41*  ANIONGAP 10 8    No results for input(s): "PROT", "ALBUMIN", "AST", "ALT", "ALKPHOS", "BILITOT" in the last 168 hours. Lipids No results for input(s): "CHOL", "TRIG", "HDL", "LABVLDL", "LDLCALC", "CHOLHDL" in the last 168 hours.  Hematology Recent Labs  Lab 03/09/23 0526 03/10/23 0242  WBC 7.5 8.7  RBC 4.36 4.21*  HGB 12.8* 12.3*  HCT 40.6 38.0*  MCV 93.1 90.3  MCH 29.4 29.2  MCHC 31.5 32.4  RDW 13.5 13.7  PLT 177 PLATELET CLUMPS NOTED ON SMEAR, UNABLE TO ESTIMATE   Thyroid No results for input(s): "TSH", "FREET4" in the last 168 hours.  BNP Recent Labs  Lab 03/09/23 0526  BNP 612.7*    DDimer No results for input(s): "DDIMER" in the last 168 hours.   Radiology/Studies:  ECHOCARDIOGRAM COMPLETE  Result Date: 03/09/2023    ECHOCARDIOGRAM REPORT   Patient Name:   Robert Horn Date  of Exam: 03/09/2023 Medical Rec #:  [id number]     Height:       72.0 in Accession #:    [id number]    Weight:       220.0 lb Date of Birth:  Jan 27, 1934     BSA:          2.219 m Patient Age:    89  years      BP:           128/95 mmHg Patient Gender: M             HR:           53 bpm. Exam Location:  Inpatient Procedure: 2D Echo, Cardiac Doppler, Color Doppler and 3D Echo Indications:    R94.31 Abnormal EKG  History:        Patient has prior history of Echocardiogram examinations, most                 recent 03/13/2021. CHF, Abnormal ECG, TIA, Aortic Valve Disease;                 Risk Factors:Hypertension and Dyslipidemia. Severe aortic                 stenosis. TAVR.                 Aortic Valve: 26 mm Edwards Sapien prosthetic, stented (TAVR)                 valve is present in the aortic position. Procedure Date:                 12/15/2016.  Sonographer:    Sheralyn Boatman RDCS Referring Phys: [id number] [facility] IMPRESSIONS  1. Left ventricular ejection fraction, by estimation, is 25 to 30%. Left ventricular ejection fraction by 3D volume is 31 %. The left ventricle has severely decreased function. The left ventricle demonstrates global hypokinesis. The left ventricular internal cavity size was mildly dilated. Left ventricular diastolic parameters are consistent with Grade III diastolic dysfunction (restrictive). There is the interventricular septum is flattened in systole and diastole, consistent with right ventricular  pressure and volume overload.  2. Right ventricular systolic function is low normal. The right ventricular size is moderately enlarged. Mildly increased right ventricular wall thickness.  3. Left atrial size was moderately dilated.  4. Right atrial size was moderately dilated.  5. There is no evidence of cardiac tamponade.  6. The mitral valve is degenerative. Mild mitral valve regurgitation.  7. The aortic valve has been repaired/replaced. Aortic valve regurgitation is not visualized. There is a 26 mm Edwards Sapien prosthetic (TAVR) valve present in the aortic position. Procedure Date: 12/15/2016. Aortic valve mean gradient measures 14.3 mmHg. Aortic valve Vmax measures 2.58 m/s. Aortic valve  acceleration time measures 67 msec.  8. Aortic dilatation noted. There is dilatation of the ascending aorta, measuring 38 mm. There is dilatation of the ascending aorta, measuring 38 mm. Comparison(s): Prior images reviewed side by side. The left ventricular function is significantly worse. The left ventricular diastolic function is significantly worse. TAVR gradients are lower due to reduction in LV function. FINDINGS  Left Ventricle: Left ventricular ejection fraction, by estimation, is 25 to 30%. Left ventricular ejection fraction by 3D volume is 31 %. The left ventricle has severely decreased function. The left ventricle demonstrates global hypokinesis. The left ventricular internal cavity size was mildly dilated. There is no left ventricular hypertrophy. The interventricular septum is flattened in  systole and diastole, consistent with right ventricular pressure and volume overload. Left ventricular diastolic function could not be evaluated due to atrial fibrillation. Left ventricular diastolic parameters are consistent with Grade III diastolic dysfunction (restrictive). Right Ventricle: The right ventricular size is moderately enlarged. Mildly increased right ventricular wall thickness. Right ventricular systolic function is low normal. Left Atrium: Left atrial size was moderately dilated. Right Atrium: Right atrial size was moderately dilated. Pericardium: Trivial pericardial effusion is present. There is no evidence of cardiac tamponade. Mitral Valve: The mitral valve is degenerative in appearance. Mild mitral valve regurgitation. Tricuspid Valve: The tricuspid valve is grossly normal. Tricuspid valve regurgitation is trivial. No evidence of tricuspid stenosis. Aortic Valve: The aortic valve has been repaired/replaced. Aortic valve regurgitation is not visualized. Aortic valve mean gradient measures 14.3 mmHg. Aortic valve peak gradient measures 26.6 mmHg. Aortic valve area, by VTI measures 1.50 cm. There is  a  26 mm Edwards Sapien prosthetic, stented (TAVR) valve present in the aortic position. Procedure Date: 12/15/2016. Pulmonic Valve: The pulmonic valve was normal in structure. Pulmonic valve regurgitation is not visualized. No evidence of pulmonic stenosis. Aorta: Aortic dilatation noted and the aortic root is normal in size and structure. There is dilatation of the ascending aorta, measuring 38 mm. There is dilatation of the ascending aorta, measuring 38 mm. IAS/Shunts: No atrial level shunt detected by color flow Doppler.  LEFT VENTRICLE PLAX 2D LVIDd:         5.80 cm LVIDs:         5.00 cm LV PW:         1.10 cm         3D Volume EF LV IVS:        0.90 cm         LV 3D EF:    Left LVOT diam:     2.55 cm                      ventricul LV SV:         75                           ar LV SV Index:   34                           ejection LVOT Area:     5.11 cm                     fraction                                             by 3D                                             volume is LV Volumes (MOD)                            31 %. LV vol d, MOD    167.0 ml A2C: LV vol d, MOD    152.0 ml      3D Volume EF: A4C:  3D EF:        31 % LV vol s, MOD    113.0 ml      LV EDV:       163 ml A2C:                           LV ESV:       112 ml LV vol s, MOD    110.0 ml      LV SV:        51 ml A4C: LV SV MOD A2C:   54.0 ml LV SV MOD A4C:   152.0 ml LV SV MOD BP:    50.9 ml RIGHT VENTRICLE            IVC RV S prime:     9.68 cm/s  IVC diam: 1.90 cm TAPSE (M-mode): 1.6 cm LEFT ATRIUM            Index        RIGHT ATRIUM           Index LA diam:      5.40 cm  2.43 cm/m   RA Area:     18.50 cm LA Vol (A2C): 116.0 ml 52.28 ml/m  RA Volume:   48.80 ml  22.00 ml/m LA Vol (A4C): 48.8 ml  21.97 ml/m  AORTIC VALVE AV Area (Vmax):    1.55 cm AV Area (Vmean):   1.48 cm AV Area (VTI):     1.50 cm AV Vmax:           257.67 cm/s AV Vmean:          176.667 cm/s AV VTI:            0.502 m AV Peak Grad:       26.6 mmHg AV Mean Grad:      14.3 mmHg LVOT Vmax:         78.35 cm/s LVOT Vmean:        51.150 cm/s LVOT VTI:          0.148 m LVOT/AV VTI ratio: 0.29  AORTA Ao Root diam: 3.80 cm Ao Asc diam:  3.80 cm MITRAL VALVE                TRICUSPID VALVE MV Area (PHT): 6.63 cm     TR Peak grad:   26.4 mmHg MV Decel Time: 115 msec     TR Vmax:        257.00 cm/s MV E velocity: 114.25 cm/s                             SHUNTS                             Systemic VTI:  0.15 m                             Systemic Diam: 2.55 cm Rachelle Hora Croitoru MD Electronically signed by Thurmon Fair MD Signature Date/Time: 03/09/2023/4:23:09 PM    Final    DG Chest 2 View  Result Date: 03/09/2023 CLINICAL DATA:  89 year old male with history of shortness of breath. EXAM: CHEST - 2 VIEW COMPARISON:  Chest x-ray 02/28/2023. FINDINGS: Lung volumes are low. There are bibasilar opacities (left-greater-than-right), which may reflect areas of atelectasis and/or consolidation.  Small left pleural effusion. No definite right pleural effusion. No pneumothorax. No evidence of pulmonary edema. Heart size is normal. Upper mediastinal contours are within normal limits. Atherosclerotic calcifications are noted in the thoracic aorta. Status post TAVR. IMPRESSION: 1. Low lung volumes with bibasilar areas of atelectasis and/or consolidation with small left pleural effusion. 2. Aortic atherosclerosis. Electronically Signed   By: Trudie Reed M.D.   On: 03/09/2023 06:04     Assessment and Plan:   NSTEMI Last catheterization was in 2017 that showed normal coronary management.  Patient presenting with worsening shortness of breath surrounding the time of his recent toe surgery revision.  He has had a precipitous decline in shortness of breath, no chest pain with newly reduced EF 25 to 30% with global hypokinesis, grade 3 diastolic dysfunction, interventricular septum flattening with low normal RV function, previously mildly reduced in 2022.  EKG shows  no acute ST-T wave changes but chronic Q waves in lead III.  Troponins [phone].  Suspect he may have had possible PE versus an acute ischemic event with sudden drop in EF.  Volume status looks decent so interventricular septum flattening may be related to possible PE.  Either way, will treat for ACS and follow-up VQ scan.  Likely will need R/L cardiac catheterization as well. Start IV heparin per pharmacy, already on DAPT with aspirin and Plavix due to PAD, continue carvedilol 3.125 mg twice daily, rosuvastatin 40 mg.  Acute HFrEF Previous history of HFmrEF Nonischemic cardiomyopathy See above. EF has been as low as 25 to 30% in the setting of aortic stenosis, status post TAVR with essentially normalization of EF. Sudden reduced EF 25 to 30% with concerns of ACS versus PE. Overall appears to be euvolemic. Currently on carvedilol 3.125 mg, Entresto 49-51 mg.  Resume Jardiance 25 mg at discharge on SSI here. Currently being diuresed with IV Lasix 40 mg twice daily.  Okay to continue this for now, assess LVEDP tomorrow and adjust accordingly.  TAVR 2018 Mean gradient 14.3.  V-max 2.58.  PAD Status post multiple R partial foot amputation, with recent debridement/revision 02/24/2023. DAPT as above and statin.   CKD Renal function looks stable at 1.6.  At baseline.  Diabetes A1c 6.4%.   Colon cancer status post colectomy   Informed Consent   Shared Decision Making/Informed Consent The risks [stroke (1 in 1000), death (1 in 1000), kidney failure [usually temporary] (1 in 500), bleeding (1 in 200), allergic reaction [possibly serious] (1 in 200)], benefits (diagnostic support and management of coronary artery disease) and alternatives of a cardiac catheterization were discussed in detail with Robert Horn and he is willing to proceed.      Risk Assessment/Risk Scores:   TIMI Risk Score for Unstable Angina or Non-ST Elevation MI:   The patient's TIMI risk score is 4, which indicates a 20%  risk of all cause mortality, new or recurrent myocardial infarction or need for urgent revascularization in the next 14 days.{  New York Heart Association (NYHA) Functional Class NYHA Class III        For questions or updates, please contact [facility] Please consult [email or web address] for contact info under    Signed, Abagail Kitchens, PA-C  03/10/2023 12:04 PM

## 2023-03-10 NOTE — Consult Note (Addendum)
Cardiology Consultation   Patient ID: [name] MRN: [id number]; DOB: [birth year]  Admit date: 03/09/2023 Date of Consult: 03/10/2023  PCP:  Renford Dills, MD   [facility] Providers Cardiologist:  Chilton Si, MD   {  Patient Profile:   Robert Horn is a 89 y.o. male with a hx of nonischemic cardiomyopathy, chronic heart failure with mildly reduced EF, TAVR 2018, PAD status post partial right foot amputations, coronary calcifications with patent coronary arteries on catheterization 2017, TIA 02/2021, CKD, diabetes, colon cancer status post colectomy hypertension, hyperlipidemia, who is being seen 03/10/2023 for the evaluation of shortness of breath at the request of Dr. Pola Corn.  History of Present Illness:   Robert Horn was previously followed by Dr.  Eldridge Dace, but now will be followed by Dr. Duke Salvia.  Has history of reduced EF 25 to 30% in the setting of mod-severe aortic stenosis.  He had a left heart cath that widely patent normal coronary arteries.  Mild pulmonary hypertension.  Showed approximately 1 month after TAVR echo showed improved EF 45 to 50% that has remained stable since last echo in 2022.  In the past he was followed by Dr. Katrinka Blazing for his PAD with previous right SFA angioplasty and stent placement in November 2020.  He has toe amputations secondary to osteomyelitis/PAD in November 2020, May 2021.  Recently had right stump debridement and partial bone resection on 02/24/2023.  He has been chronically managed on aspirin and Plavix.  Currently patient is being evaluated with worsening shortness of breath fatigue that started approximately around his recent toe revision surgery.  He states that he had to cancel his appointment 3 times and [facility] was holding his heart failure medications including his Lasix for multiple days.  He came to the emergency room on 02/28/2023 he was given IV Lasix and discharged home with recommendations to follow-up with PCP.   Since this him and his wife report that he has had persistent shortness of breath and fatigue after being requested to hold his heart failure medications.  Last night he had an episode of significant shortness of breath that occurred during the middle of the night that prompted today's admission.  Cardiology has been asked to further evaluate this as well as sudden reduced EF 25 to 30%, global hypokinesis with grade 3 diastolic dysfunction and interventricular septum flattening.  Low normal RV function.  Patient currently being transferred downstairs to perform VQ scan to assess for PE.  Per wife patient has done extremely well from heart failure standpoint throughout these years.  States that he is very ambulatory and independent without any prior symptoms of shortness of breath, orthopnea, peripheral edema until more recently surrounding his toe revision surgery.  Currently he feels okay.  Does not have any chest pain, denies significant shortness of breath currently.  He reports minor improvement since starting IV Lasix.  Patient is well-appearing, no acute distress.  Vital signs stable, slightly elevated blood pressure.  Not tachycardic.  BNP 600+, troponins [id number].  EKG showing no acute ST-T wave changes, inferior Q waves.  Chest x-ray showing low lung volumes with bibasilar atelectasis and/or consolidation with small left pleural effusion.  Creatinine 1.61.  Hemoglobin 12.3.  A1c 6.4%.   Past Medical History:  Diagnosis Date   Anemia    Anxiety    situational- surgery   Asthma    as a child   Cancer (HCC) 2019   colon- colectomy    CHF (congestive heart failure) (HCC)  Chronic kidney disease    followed by Dr. Nehemiah Settle   Coronary artery disease    Diabetes mellitus without complication (HCC)    Type II   Dyslipidemia 10/27/2015   Dyspnea    w/ exertion    Elevated PSA 10/27/2015   Erectile dysfunction 10/27/2015   Heart murmur    Hypertension 10/27/2015   Hypogonadism male  10/27/2015   Neuropathy    Obesity 10/27/2015   Peripheral vascular disease (HCC)    Pneumonia 10/27/2015   pt states was 1982   Rotator cuff tear 10/27/2015    Past Surgical History:  Procedure Laterality Date   ABDOMINAL AORTOGRAM N/A 02/01/2019   Procedure: ABDOMINAL AORTOGRAM;  Surgeon: Cephus Shelling, MD;  Location: [facility] INVASIVE CV LAB;  Service: Cardiovascular;  Laterality: N/A;   AMPUTATION Right 02/03/2019   Procedure: RIGHT FOOT 1ST RAY AMPUTATION;  Surgeon: Nadara Mustard, MD;  Location[facility] OR;  Service: Orthopedics;  Laterality: Right;   AMPUTATION Right 08/02/2019   Procedure: RIGHT 2ND TOE AMPUTATION;  Surgeon: Nadara Mustard, MD;  Location: [facility] OR;  Service: Orthopedics;  Laterality: Right;   AMPUTATION Right 11/08/2020   Procedure: RIGHT TRANSMETATARSAL AMPUTATION;  Surgeon: Nadara Mustard, MD;  Location: [facility] OR;  Service: Orthopedics;  Laterality: Right;   APPLICATION OF WOUND VAC  11/08/2020   Procedure: APPLICATION OF WOUND VAC;  Surgeon: Nadara Mustard, MD;  Location: [facility] OR;  Service: Orthopedics;;   CARDIAC CATHETERIZATION N/A 11/14/2015   Procedure: Left Heart Cath and Coronary Angiography;  Surgeon: Lyn Records, MD;  Location: [facility] INVASIVE CV LAB;  Service: Cardiovascular;  Laterality: N/A;   COLONOSCOPY     LAPAROSCOPIC PARTIAL COLECTOMY  12/15/2017   LAPAROSCOPIC PARTIAL COLECTOMY (N/A Abdomen)   LAPAROSCOPIC PARTIAL COLECTOMY N/A 12/15/2017   Procedure: LAPAROSCOPIC PARTIAL COLECTOMY;  Surgeon: Harriette Bouillon, MD;  Location: [facility] OR;  Service: General;  Laterality: N/A;   LOWER EXTREMITY ANGIOGRAPHY Right 02/01/2019   Procedure: LOWER EXTREMITY ANGIOGRAPHY;  Surgeon: Cephus Shelling, MD;  Location: [facility] INVASIVE CV LAB;  Service: Cardiovascular;  Laterality: Right;   PERIPHERAL VASCULAR INTERVENTION Right 02/01/2019   Procedure: PERIPHERAL VASCULAR INTERVENTION;  Surgeon: Cephus Shelling, MD;  Location: [facility] INVASIVE CV LAB;  Service: Cardiovascular;   Laterality: Right;  SFA   TEE WITHOUT CARDIOVERSION N/A 12/15/2016   Procedure: TRANSESOPHAGEAL ECHOCARDIOGRAM (TEE);  Surgeon: Tonny Bollman, MD;  Location: [facility] OR;  Service: Open Heart Surgery;  Laterality: N/A;   TRANSCATHETER AORTIC VALVE REPLACEMENT, TRANSFEMORAL N/A 12/15/2016   Procedure: TRANSCATHETER AORTIC VALVE REPLACEMENT, TRANSFEMORAL;  Surgeon: Tonny Bollman, MD;  Location: [facility] OR;  Service: Open Heart Surgery;  Laterality: N/A;     Inpatient Medications: Scheduled Meds:  aspirin EC  81 mg Oral Daily   carvedilol  3.125 mg Oral BID WC   clopidogrel  75 mg Oral Daily   enoxaparin (LOVENOX) injection  40 mg Subcutaneous Q24H   [START ON 03/11/2023] ferrous sulfate  325 mg Oral Q breakfast   furosemide  40 mg Intravenous BID   insulin aspart  0-5 Units Subcutaneous QHS   insulin aspart  0-9 Units Subcutaneous TID WC   pneumococcal 20-valent conjugate vaccine  0.5 mL Intramuscular Tomorrow-1000   rosuvastatin  40 mg Oral Daily   sacubitril-valsartan  1 tablet Oral BID   senna  1 tablet Oral BID   Continuous Infusions:  PRN Meds: acetaminophen **OR** acetaminophen, albuterol, bisacodyl, hydrALAZINE, polyethylene glycol  Allergies:    Allergies  Allergen Reactions  Ticagrelor Other (See Comments)    Unknown     Social History:   Social History   Socioeconomic History   Marital status: Married    Spouse name: Alice   Number of children: 2   Years of education: Not on file   Highest education level: Master's degree (e.g., MA, MS, MEng, MEd, MSW, MBA)  Occupational History   Occupation: Part-time  Tobacco Use   Smoking status: Former    Types: Cigarettes    Passive exposure: Never   Smokeless tobacco: Never  Vaping Use   Vaping status: Never Used  Substance and Sexual Activity   Alcohol use: Yes    Comment: rarely   Drug use: No   Sexual activity: Not on file  Other Topics Concern   Not on file  Social History Narrative   Not on file   Social  Determinants of Health   Financial Resource Strain: Low Risk  (03/10/2023)   Overall Financial Resource Strain (CARDIA)    Difficulty of Paying Living Expenses: Not hard at all  Food Insecurity: No Food Insecurity (03/09/2023)   Hunger Vital Sign    Worried About Running Out of Food in the Last Year: Never true    Ran Out of Food in the Last Year: Never true  Transportation Needs: No Transportation Needs (03/09/2023)   PRAPARE - Administrator, Civil Service (Medical): No    Lack of Transportation (Non-Medical): No  Physical Activity: Not on file  Stress: Not on file  Social Connections: Not on file  Intimate Partner Violence: Not At Risk (03/09/2023)   Humiliation, Afraid, Rape, and Kick questionnaire    Fear of Current or Ex-Partner: No    Emotionally Abused: No    Physically Abused: No    Sexually Abused: No    Family History:   Family History  Problem Relation Age of Onset   Diabetes Mother    Heart disease Mother    Pulmonary embolism Father      ROS:  Please see the history of present illness.  All other ROS reviewed and negative.     Physical Exam/Data:   Vitals:   03/09/23 2245 03/10/23 0034 03/10/23 0453 03/10/23 0745  BP: (!) 106/92  130/84 (!) 151/87  Pulse: 81  69 79  Resp: 16  17 18   Temp:   98.3 F (36.8 C) 98.3 F (36.8 C)  TempSrc:   Oral Oral  SpO2: 100%  93% 93%  Weight:  99 kg    Height:  6' (1.829 m)      Intake/Output Summary (Last 24 hours) at 03/10/2023 1204 Last data filed at 03/10/2023 0456 Gross per 24 hour  Intake --  Output 2325 ml  Net -2325 ml      03/10/2023   12:34 AM 03/09/2023    5:19 AM 02/28/2023    9:25 AM  Last 3 Weights  Weight (lbs) 218 lb 4.1 oz 220 lb 224 lb  Weight (kg) 99 kg 99.791 kg 101.606 kg     Body mass index is 29.6 kg/m.  General:  Well nourished, well developed, in no acute distress HEENT: normal Neck: no JVD Vascular: No carotid bruits; Distal pulses 2+ bilaterally Cardiac:  normal  S1, S2; RRR; no murmur  Lungs:  clear to auscultation bilaterally, no wheezing, rhonchi or rales  Abd: soft, nontender, no hepatomegaly  Ext: no edema Musculoskeletal:  No deformities, BUE and BLE strength normal and equal Skin: warm and dry  Neuro:  CNs 2-12 intact, no focal abnormalities noted Psych:  Normal affect   EKG:  The EKG was personally reviewed and demonstrates: Sinus rhythm with first-degree AV block, PR 228.  Q waves in lead III.  Subtle ST elevation in V3, may be repolarization.  No acute ST-T wave changes.  This was a repeat EKG that does not show significant changes from yesterday's. Telemetry:  Telemetry was personally reviewed and demonstrates: Sinus rhythm, 70s 80s.  Occasional PVCs.  Relevant CV Studies: Echocardiogram 03/09/2023  1. Left ventricular ejection fraction, by estimation, is 25 to 30%. Left  ventricular ejection fraction by 3D volume is 31 %. The left ventricle has  severely decreased function. The left ventricle demonstrates global  hypokinesis. The left ventricular  internal cavity size was mildly dilated. Left ventricular diastolic  parameters are consistent with Grade III diastolic dysfunction  (restrictive). There is the interventricular septum is flattened in  systole and diastole, consistent with right ventricular   pressure and volume overload.   2. Right ventricular systolic function is low normal. The right  ventricular size is moderately enlarged. Mildly increased right  ventricular wall thickness.   3. Left atrial size was moderately dilated.   4. Right atrial size was moderately dilated.   5. There is no evidence of cardiac tamponade.   6. The mitral valve is degenerative. Mild mitral valve regurgitation.   7. The aortic valve has been repaired/replaced. Aortic valve  regurgitation is not visualized. There is a 26 mm Edwards Sapien  prosthetic (TAVR) valve present in the aortic position. Procedure Date:  12/15/2016. Aortic valve mean  gradient measures 14.3  mmHg. Aortic valve Vmax measures 2.58 m/s. Aortic valve acceleration time  measures 67 msec.   8. Aortic dilatation noted. There is dilatation of the ascending aorta,  measuring 38 mm. There is dilatation of the ascending aorta, measuring 38  mm.   Comparison(s): Prior images reviewed side by side. The left ventricular  function is significantly worse. The left ventricular diastolic function  is significantly worse. TAVR gradients are lower due to reduction in LV  function.    Laboratory Data:  High Sensitivity Troponin:   Recent Labs  Lab 02/28/23 0951 02/28/23 1223 03/09/23 0526 03/09/23 0924 03/10/23 0242  TROPONINIHS 88* 81* 55* 118* 1,325*     Chemistry Recent Labs  Lab 03/09/23 0526 03/10/23 0242  NA 140 140  K 3.6 3.6  CL 106 108  CO2 24 24  GLUCOSE 125* 111*  BUN 22 24*  CREATININE 1.61* 1.61*  CALCIUM 9.0 8.8*  MG  --  2.4  GFRNONAA 41* 41*  ANIONGAP 10 8    No results for input(s): "PROT", "ALBUMIN", "AST", "ALT", "ALKPHOS", "BILITOT" in the last 168 hours. Lipids No results for input(s): "CHOL", "TRIG", "HDL", "LABVLDL", "LDLCALC", "CHOLHDL" in the last 168 hours.  Hematology Recent Labs  Lab 03/09/23 0526 03/10/23 0242  WBC 7.5 8.7  RBC 4.36 4.21*  HGB 12.8* 12.3*  HCT 40.6 38.0*  MCV 93.1 90.3  MCH 29.4 29.2  MCHC 31.5 32.4  RDW 13.5 13.7  PLT 177 PLATELET CLUMPS NOTED ON SMEAR, UNABLE TO ESTIMATE   Thyroid No results for input(s): "TSH", "FREET4" in the last 168 hours.  BNP Recent Labs  Lab 03/09/23 0526  BNP 612.7*    DDimer No results for input(s): "DDIMER" in the last 168 hours.   Radiology/Studies:  ECHOCARDIOGRAM COMPLETE  Result Date: 03/09/2023    ECHOCARDIOGRAM REPORT   Patient Name:   Robert Horn Date  of Exam: 03/09/2023 Medical Rec #:  [id number]     Height:       72.0 in Accession #:    [id number]    Weight:       220.0 lb Date of Birth:  Jan 27, 1934     BSA:          2.219 m Patient Age:    89  years      BP:           128/95 mmHg Patient Gender: M             HR:           53 bpm. Exam Location:  Inpatient Procedure: 2D Echo, Cardiac Doppler, Color Doppler and 3D Echo Indications:    R94.31 Abnormal EKG  History:        Patient has prior history of Echocardiogram examinations, most                 recent 03/13/2021. CHF, Abnormal ECG, TIA, Aortic Valve Disease;                 Risk Factors:Hypertension and Dyslipidemia. Severe aortic                 stenosis. TAVR.                 Aortic Valve: 26 mm Edwards Sapien prosthetic, stented (TAVR)                 valve is present in the aortic position. Procedure Date:                 12/15/2016.  Sonographer:    Sheralyn Boatman RDCS Referring Phys: [id number] [facility] IMPRESSIONS  1. Left ventricular ejection fraction, by estimation, is 25 to 30%. Left ventricular ejection fraction by 3D volume is 31 %. The left ventricle has severely decreased function. The left ventricle demonstrates global hypokinesis. The left ventricular internal cavity size was mildly dilated. Left ventricular diastolic parameters are consistent with Grade III diastolic dysfunction (restrictive). There is the interventricular septum is flattened in systole and diastole, consistent with right ventricular  pressure and volume overload.  2. Right ventricular systolic function is low normal. The right ventricular size is moderately enlarged. Mildly increased right ventricular wall thickness.  3. Left atrial size was moderately dilated.  4. Right atrial size was moderately dilated.  5. There is no evidence of cardiac tamponade.  6. The mitral valve is degenerative. Mild mitral valve regurgitation.  7. The aortic valve has been repaired/replaced. Aortic valve regurgitation is not visualized. There is a 26 mm Edwards Sapien prosthetic (TAVR) valve present in the aortic position. Procedure Date: 12/15/2016. Aortic valve mean gradient measures 14.3 mmHg. Aortic valve Vmax measures 2.58 m/s. Aortic valve  acceleration time measures 67 msec.  8. Aortic dilatation noted. There is dilatation of the ascending aorta, measuring 38 mm. There is dilatation of the ascending aorta, measuring 38 mm. Comparison(s): Prior images reviewed side by side. The left ventricular function is significantly worse. The left ventricular diastolic function is significantly worse. TAVR gradients are lower due to reduction in LV function. FINDINGS  Left Ventricle: Left ventricular ejection fraction, by estimation, is 25 to 30%. Left ventricular ejection fraction by 3D volume is 31 %. The left ventricle has severely decreased function. The left ventricle demonstrates global hypokinesis. The left ventricular internal cavity size was mildly dilated. There is no left ventricular hypertrophy. The interventricular septum is flattened in  systole and diastole, consistent with right ventricular pressure and volume overload. Left ventricular diastolic function could not be evaluated due to atrial fibrillation. Left ventricular diastolic parameters are consistent with Grade III diastolic dysfunction (restrictive). Right Ventricle: The right ventricular size is moderately enlarged. Mildly increased right ventricular wall thickness. Right ventricular systolic function is low normal. Left Atrium: Left atrial size was moderately dilated. Right Atrium: Right atrial size was moderately dilated. Pericardium: Trivial pericardial effusion is present. There is no evidence of cardiac tamponade. Mitral Valve: The mitral valve is degenerative in appearance. Mild mitral valve regurgitation. Tricuspid Valve: The tricuspid valve is grossly normal. Tricuspid valve regurgitation is trivial. No evidence of tricuspid stenosis. Aortic Valve: The aortic valve has been repaired/replaced. Aortic valve regurgitation is not visualized. Aortic valve mean gradient measures 14.3 mmHg. Aortic valve peak gradient measures 26.6 mmHg. Aortic valve area, by VTI measures 1.50 cm. There is  a  26 mm Edwards Sapien prosthetic, stented (TAVR) valve present in the aortic position. Procedure Date: 12/15/2016. Pulmonic Valve: The pulmonic valve was normal in structure. Pulmonic valve regurgitation is not visualized. No evidence of pulmonic stenosis. Aorta: Aortic dilatation noted and the aortic root is normal in size and structure. There is dilatation of the ascending aorta, measuring 38 mm. There is dilatation of the ascending aorta, measuring 38 mm. IAS/Shunts: No atrial level shunt detected by color flow Doppler.  LEFT VENTRICLE PLAX 2D LVIDd:         5.80 cm LVIDs:         5.00 cm LV PW:         1.10 cm         3D Volume EF LV IVS:        0.90 cm         LV 3D EF:    Left LVOT diam:     2.55 cm                      ventricul LV SV:         75                           ar LV SV Index:   34                           ejection LVOT Area:     5.11 cm                     fraction                                             by 3D                                             volume is LV Volumes (MOD)                            31 %. LV vol d, MOD    167.0 ml A2C: LV vol d, MOD    152.0 ml      3D Volume EF: A4C:  3D EF:        31 % LV vol s, MOD    113.0 ml      LV EDV:       163 ml A2C:                           LV ESV:       112 ml LV vol s, MOD    110.0 ml      LV SV:        51 ml A4C: LV SV MOD A2C:   54.0 ml LV SV MOD A4C:   152.0 ml LV SV MOD BP:    50.9 ml RIGHT VENTRICLE            IVC RV S prime:     9.68 cm/s  IVC diam: 1.90 cm TAPSE (M-mode): 1.6 cm LEFT ATRIUM            Index        RIGHT ATRIUM           Index LA diam:      5.40 cm  2.43 cm/m   RA Area:     18.50 cm LA Vol (A2C): 116.0 ml 52.28 ml/m  RA Volume:   48.80 ml  22.00 ml/m LA Vol (A4C): 48.8 ml  21.97 ml/m  AORTIC VALVE AV Area (Vmax):    1.55 cm AV Area (Vmean):   1.48 cm AV Area (VTI):     1.50 cm AV Vmax:           257.67 cm/s AV Vmean:          176.667 cm/s AV VTI:            0.502 m AV Peak Grad:       26.6 mmHg AV Mean Grad:      14.3 mmHg LVOT Vmax:         78.35 cm/s LVOT Vmean:        51.150 cm/s LVOT VTI:          0.148 m LVOT/AV VTI ratio: 0.29  AORTA Ao Root diam: 3.80 cm Ao Asc diam:  3.80 cm MITRAL VALVE                TRICUSPID VALVE MV Area (PHT): 6.63 cm     TR Peak grad:   26.4 mmHg MV Decel Time: 115 msec     TR Vmax:        257.00 cm/s MV E velocity: 114.25 cm/s                             SHUNTS                             Systemic VTI:  0.15 m                             Systemic Diam: 2.55 cm Rachelle Hora Croitoru MD Electronically signed by Thurmon Fair MD Signature Date/Time: 03/09/2023/4:23:09 PM    Final    DG Chest 2 View  Result Date: 03/09/2023 CLINICAL DATA:  89 year old male with history of shortness of breath. EXAM: CHEST - 2 VIEW COMPARISON:  Chest x-ray 02/28/2023. FINDINGS: Lung volumes are low. There are bibasilar opacities (left-greater-than-right), which may reflect areas of atelectasis and/or consolidation.  Small left pleural effusion. No definite right pleural effusion. No pneumothorax. No evidence of pulmonary edema. Heart size is normal. Upper mediastinal contours are within normal limits. Atherosclerotic calcifications are noted in the thoracic aorta. Status post TAVR. IMPRESSION: 1. Low lung volumes with bibasilar areas of atelectasis and/or consolidation with small left pleural effusion. 2. Aortic atherosclerosis. Electronically Signed   By: Trudie Reed M.D.   On: 03/09/2023 06:04     Assessment and Plan:   NSTEMI Last catheterization was in 2017 that showed normal coronary management.  Patient presenting with worsening shortness of breath surrounding the time of his recent toe surgery revision.  He has had a precipitous decline in shortness of breath, no chest pain with newly reduced EF 25 to 30% with global hypokinesis, grade 3 diastolic dysfunction, interventricular septum flattening with low normal RV function, previously mildly reduced in 2022.  EKG shows  no acute ST-T wave changes but chronic Q waves in lead III.  Troponins [phone].  Suspect he may have had possible PE versus an acute ischemic event with sudden drop in EF.  Volume status looks decent so interventricular septum flattening may be related to possible PE.  Either way, will treat for ACS and follow-up VQ scan.  Likely will need R/L cardiac catheterization as well. Start IV heparin per pharmacy, already on DAPT with aspirin and Plavix due to PAD, continue carvedilol 3.125 mg twice daily, rosuvastatin 40 mg.  Acute HFrEF Previous history of HFmrEF Nonischemic cardiomyopathy See above. EF has been as low as 25 to 30% in the setting of aortic stenosis, status post TAVR with essentially normalization of EF. Sudden reduced EF 25 to 30% with concerns of ACS versus PE. Overall appears to be euvolemic. Currently on carvedilol 3.125 mg, Entresto 49-51 mg.  Resume Jardiance 25 mg at discharge on SSI here. Currently being diuresed with IV Lasix 40 mg twice daily.  Okay to continue this for now, assess LVEDP tomorrow and adjust accordingly.  TAVR 2018 Mean gradient 14.3.  V-max 2.58.  PAD Status post multiple R partial foot amputation, with recent debridement/revision 02/24/2023. DAPT as above and statin.   CKD Renal function looks stable at 1.6.  At baseline.  Diabetes A1c 6.4%.   Colon cancer status post colectomy   Informed Consent   Shared Decision Making/Informed Consent The risks [stroke (1 in 1000), death (1 in 1000), kidney failure [usually temporary] (1 in 500), bleeding (1 in 200), allergic reaction [possibly serious] (1 in 200)], benefits (diagnostic support and management of coronary artery disease) and alternatives of a cardiac catheterization were discussed in detail with Robert Horn and he is willing to proceed.      Risk Assessment/Risk Scores:   TIMI Risk Score for Unstable Angina or Non-ST Elevation MI:   The patient's TIMI risk score is 4, which indicates a 20%  risk of all cause mortality, new or recurrent myocardial infarction or need for urgent revascularization in the next 14 days.{  New York Heart Association (NYHA) Functional Class NYHA Class III        For questions or updates, please contact [facility] Please consult [email or web address] for contact info under    Signed, Abagail Kitchens, PA-C  03/10/2023 12:04 PM

## 2023-03-10 NOTE — TOC Initial Note (Signed)
Transition of Care [facility]) - Initial/Assessment Note    Patient Details  Name: Robert Horn MRN: [id number] Date of Birth: 09/17/1933  Transition of Care [facility]) CM/SW Contact:    Leone Haven, RN Phone Number: 03/10/2023, 11:25 AM  Clinical Narrative:                 From home with spouse,  per wife he has PCP, Dr. Nehemiah Settle,  and insurance on file, states has no HH services in place at this time, he has walker, cane and motorized knee scooter, has had recent foot surgery per wife.  States she will transport him home at [employer] and she is support system, states [facility] [address].  Pta since foot surgery uses knee scooter.   Expected Discharge Plan: Home w Home Health Services Barriers to Discharge: Continued Medical Work up   Patient Goals and CMS Choice Patient states their goals for this hospitalization and ongoing recovery are:: get better   Choice offered to / list presented to : NA      Expected Discharge Plan and Services In-house Referral: NA Discharge Planning Services: CM Consult Post Acute Care Choice: NA Living arrangements for the past 2 months: Single Family Home                 DME Arranged: N/A DME Agency: NA       HH Arranged: NA          Prior Living Arrangements/Services Living arrangements for the past 2 months: Single Family Home Lives with:: Spouse Patient language and need for interpreter reviewed:: Yes Do you feel safe going back to the place where you live?: Yes      Need for Family Participation in Patient Care: Yes (Comment) Care giver support system in place?: Yes (comment) Current home services: DME (walker , cane, motorized  knee scooter) Criminal Activity/Legal Involvement Pertinent to Current Situation/Hospitalization: No - Comment as needed  Activities of Daily Living   ADL Screening (condition at time of admission) Independently performs ADLs?: Yes (appropriate for developmental age) Is the patient deaf or  have difficulty hearing?: Yes Does the patient have difficulty seeing, even when wearing glasses/contacts?: No Does the patient have difficulty concentrating, remembering, or making decisions?: No  Permission Sought/Granted                  Emotional Assessment       Orientation: : Oriented to Self, Oriented to Place, Oriented to  Time, Oriented to Situation Alcohol / Substance Use: Not Applicable Psych Involvement: No (comment)  Admission diagnosis:  Acute CHF (congestive heart failure) (HCC) [I50.9] Acute on chronic combined systolic and diastolic congestive heart failure (HCC) [I50.43] Patient Active Problem List   Diagnosis Date Noted   Acute CHF (congestive heart failure) (HCC) 03/09/2023   COVID-19 virus infection 03/16/2021   TIA (transient ischemic attack) 03/12/2021   Diabetes mellitus type 2, controlled (HCC) 03/12/2021   Right foot ulcer, limited to breakdown of skin (HCC) 02/28/2020   Osteomyelitis of second toe of right foot (HCC)    Osteomyelitis of great toe of right foot (HCC)    Severe protein-calorie malnutrition (HCC)    Diabetic polyneuropathy associated with type 2 diabetes mellitus (HCC)    Cutaneous abscess of right foot    Cellulitis of right foot 01/30/2019   CKD (chronic kidney disease), stage III (HCC) 01/30/2019   Elevated LFTs 01/30/2019   Colon cancer (HCC) 01/30/2019   Anemia of chronic disease 01/30/2019  Acute on chronic combined systolic and diastolic CHF (congestive heart failure) (HCC) 03/09/2018   AKI (acute kidney injury) (HCC) 03/09/2018   Colonic mass 12/10/2017   S/P TAVR (transcatheter aortic valve replacement) 03/03/2017   Severe aortic stenosis 12/15/2016   Essential hypertension 05/09/2013   Hyperlipidemia 05/09/2013   PVC's (premature ventricular contractions) 05/09/2013   Chronic combined systolic and diastolic heart failure (HCC) 05/09/2013   PCP:  Renford Dills, MD Pharmacy:   [phone] [place] [place] -  [address] [place] [place] [address] Phone: [phone] Fax: [phone]  [facility] [address] [place] [address] Phone: [phone] Fax: [phone]     Social Determinants of Health (SDOH) Social History: SDOH Screenings   Food Insecurity: No Food Insecurity (03/09/2023)  Housing: Low Risk  (03/09/2023)  Transportation Needs: No Transportation Needs (03/09/2023)  Utilities: Not At Risk (03/09/2023)  Alcohol Screen: Low Risk  (03/10/2023)  Depression (PHQ2-9): Low Risk  (05/13/2021)  Financial Resource Strain: Low Risk  (03/10/2023)  Tobacco Use: Medium Risk (03/09/2023)   SDOH Interventions: Alcohol Usage Interventions: Intervention Not Indicated (Score <7) Financial Strain Interventions: Intervention Not Indicated   Readmission Risk Interventions     No data to display

## 2023-03-10 NOTE — Care Management Obs Status (Signed)
MEDICARE OBSERVATION STATUS NOTIFICATION   Patient Details  Name: Robert Horn MRN: [id number] Date of Birth: 30-Nov-1933   Medicare Observation Status Notification Given:  Yes    Leone Haven, RN 03/10/2023, 11:17 AM

## 2023-03-10 NOTE — Progress Notes (Signed)
PROGRESS NOTE  Robert Horn  DOB: April 02, 1933  PCP: Renford Dills, MD NFA:[id number]  DOA: 03/09/2023  LOS: 0 days  Hospital Day: 2  Brief narrative: Robert Horn is a 89 y.o. male with PMH significant for DM2, HTN, HLD, CAD, CHF, PAD, CKD, colon cancer status post colectomy, recent right partial foot amputation at [facility]. Patient presented to the ED today with concern of dyspnea, fatigue, progressively worsening edema of lower extremities and abdomen. Patient had CHF with last known EF 45 to 50% in 2022.  He used to follow with Dr. Katrinka Blazing, Dr. Eldridge Dace. Patient recently had right foot partial amputation at [facility].  That surgery had to be canceled 3 times and ultimately got done in the last week of November.  During that interval, patient was made to hold Lasix for multiple days.  Surgery was successful but patient and his wife state that patient breathing has not been normal since then.  12/1, patient was seen at the ED for dyspnea.  He was given IV Lasix.  Felt better with diuresis discharged home to follow-up with PCP.  12/10, patient woke up early in the morning with shortness of breath and returned to ED.  In the ED, patient was hemodynamically stable Clinically no evidence of volume overload.  Chest x-ray did not show pulm edema However he continued to have chest tightness.  BNP was elevated.   Troponin was elevated.  EKG without significant ST-T wave changes Patient was given 1 dose of Lasix 80 mg Admitted to [facility]  Echocardiogram repeated showed significant drop in EF to 25 to 30% Cardiology consulted  Subjective: Patient was seen and examined this morning.  Pleasant elderly African-American male.  Lying down in bed.  Not in distress not on supplemental oxygen.  Wife at bedside.  We discussed about the new echo finding and the need to be seen by cardiologist.  Assessment/Plan: Acute exacerbation of CHF Essential hypertension Presenting with shortness of breath, elevated  BNP Echocardiogram with EF 25 to 30%, global LV hypokinesis.  EF is low compared to 45 to 50% from 2022. Currently on IV Lasix 40 mg twice daily. PTA meds- Coreg 3.125 mg twice daily, Lasix 40 mg daily, Jardiance 25 mg daily, Entresto 49/51 mg twice daily Continued on Coreg and Entresto. Net IO Since Admission: -2,325 mL [03/10/23 0955] Continue to monitor for daily intake output, weight, blood pressure, BNP, renal function and electrolytes. Recent Labs  Lab 03/09/23 0526 03/10/23 0242  BNP 612.7*  --   BUN 22 24*  CREATININE 1.61* 1.61*  NA 140 140  K 3.6 3.6  MG  --  2.4   Elevated troponin CAD, HLD Troponin elevation is likely due to demand ischemia from CHF/elevation. Repeat troponin this morning PTA meds- aspirin, Plavix, Crestor Continue all Recent Labs    03/09/23 0526 03/09/23 0924  TROPONINIHS 55* 118*   PAD S/p recent right partial foot amputation at [facility]  CKD 3B Creatinine at baseline. Recent Labs    08/11/22 1607 02/28/23 0944 03/09/23 0526 03/10/23 0242  BUN 24 20 22  24*  CREATININE 1.54* 1.46* 1.61* 1.61*   Type 2 diabetes mellitus A1c 6.4 on 03/10/2023 PTA meds-Ozempic weekly on Wednesday Currently on SSI/Accu-Cheks Recent Labs  Lab 03/09/23 1330 03/09/23 1635 03/09/23 2159 03/10/23 0507  GLUCAP 126* 126* 118* 114*   Colon cancer  S/p colectomy  Mobility: Encourage ambulation  Goals of care   Code Status: Full Code    DVT prophylaxis:  enoxaparin (LOVENOX) injection 40 mg Start:  03/10/23 0800   Antimicrobials: None Fluid: None Consultants: None Family Communication: Wife at bedside   Status: Observation Level of care:  Telemetry Cardiac   Patient is from: Home Needs to continue in-hospital care: Pending cardiology eval. Anticipated d/c to: Pending clinical course   Diet:  Diet Order             Diet Heart Room service appropriate? Yes; Fluid consistency: Thin  Diet effective now                   Scheduled  Meds:  aspirin EC  81 mg Oral Daily   carvedilol  3.125 mg Oral BID WC   clopidogrel  75 mg Oral Daily   enoxaparin (LOVENOX) injection  40 mg Subcutaneous Q24H   [START ON 03/11/2023] ferrous sulfate  325 mg Oral Q breakfast   furosemide  40 mg Intravenous BID   insulin aspart  0-5 Units Subcutaneous QHS   insulin aspart  0-9 Units Subcutaneous TID WC   pneumococcal 20-valent conjugate vaccine  0.5 mL Intramuscular Tomorrow-1000   rosuvastatin  40 mg Oral Daily   sacubitril-valsartan  1 tablet Oral BID   senna  1 tablet Oral BID    PRN meds: acetaminophen **OR** acetaminophen, albuterol, bisacodyl, hydrALAZINE, polyethylene glycol   Infusions:    Antimicrobials: Anti-infectives (From admission, onward)    None       Objective: Vitals:   03/10/23 0453 03/10/23 0745  BP: 130/84 (!) 151/87  Pulse: 69 79  Resp: 17 18  Temp: 98.3 F (36.8 C) 98.3 F (36.8 C)  SpO2: 93% 93%    Intake/Output Summary (Last 24 hours) at 03/10/2023 0955 Last data filed at 03/10/2023 0456 Gross per 24 hour  Intake --  Output 2325 ml  Net -2325 ml   Filed Weights   03/09/23 0519 03/10/23 0034  Weight: 99.8 kg 99 kg   Weight change: -0.791 kg Body mass index is 29.6 kg/m.   Physical Exam: General exam: Pleasant, elderly African-American male. Skin: No rashes, lesions or ulcers. HEENT: Atraumatic, normocephalic, no obvious bleeding Lungs: Clear to auscultation bilaterally CVS: Regular rate and rhythm, no murmur GI/Abd soft, nontender, nondistended, bowel sound present CNS: Alert, awake, oriented x 3 Psychiatry: Anxious with the new echo finding Extremities: No pedal edema, no calf tenderness  Data Review: I have personally reviewed the laboratory data and studies available.  F/u labs ordered Unresulted Labs (From admission, onward)     Start     Ordered   03/11/23 0500  Basic metabolic panel  Daily,   R     Question:  Specimen collection method  Answer:  Lab=Lab collect    03/10/23 0952   03/11/23 0500  CBC with Differential/Platelet  Tomorrow morning,   R       Question:  Specimen collection method  Answer:  Lab=Lab collect   03/10/23 0952            Total time spent in review of labs and imaging, patient evaluation, formulation of plan, documentation and communication with family: 45 minutes  Signed, Lorin Glass, MD [facility] 03/10/2023

## 2023-03-10 NOTE — Progress Notes (Signed)
Heart Failure Nurse Navigator Progress Note  PCP: Renford Dills, MD PCP-Cardiologist: [facility] ( [facility])  Admission Diagnosis: Acute on chronic combined systolic and diastolic congestive heart failure. Admitted from: Home via EMS  Presentation:   [name] presented with shortness of breath, fatigue, BLE and abdomen edema,  and leg pain, recently had partial amputation surgery thru [facility] on his right foot. BP 140/100, HR 64, BNP 612.7, Troponin 118, EKG showed NSR, CXR showed low lung volumes with bibasilar areas of atelectasis, consolidation with small left pleural effusion. Cardiology consulted.   Education was done on the sign and symptoms of heart failure, daily weights, when to call his doctor or go to the ED, Diet/ fluid restrictions, taking all medications as  prescribed and attending all medical appointments. A HF TOC appointment was scheduled for 03/26/2023 @ 2 pm.   ECHO/ LVEF: 25-30%  Clinical Course:  Past Medical History:  Diagnosis Date   Anemia    Anxiety    situational- surgery   Asthma    as a child   Cancer (HCC) 2019   colon- colectomy    CHF (congestive heart failure) (HCC)    Chronic kidney disease    followed by Dr. Nehemiah Settle   Coronary artery disease    Diabetes mellitus without complication (HCC)    Type II   Dyslipidemia 10/27/2015   Dyspnea    w/ exertion    Elevated PSA 10/27/2015   Erectile dysfunction 10/27/2015   Heart murmur    Hypertension 10/27/2015   Hypogonadism male 10/27/2015   Neuropathy    Obesity 10/27/2015   Peripheral vascular disease (HCC)    Pneumonia 10/27/2015   pt states was 1982   Rotator cuff tear 10/27/2015     Social History   Socioeconomic History   Marital status: Married    Spouse name: Paediatric nurse   Number of children: 2   Years of education: Not on file   Highest education level: Master's degree (e.g., MA, MS, MEng, MEd, MSW, MBA)  Occupational History   Occupation: Part-time  Tobacco Use   Smoking status:  Former    Types: Cigarettes    Passive exposure: Never   Smokeless tobacco: Never  Vaping Use   Vaping status: Never Used  Substance and Sexual Activity   Alcohol use: Yes    Comment: rarely   Drug use: No   Sexual activity: Not on file  Other Topics Concern   Not on file  Social History Narrative   Not on file   Social Determinants of Health   Financial Resource Strain: Low Risk  (03/10/2023)   Overall Financial Resource Strain (CARDIA)    Difficulty of Paying Living Expenses: Not hard at all  Food Insecurity: No Food Insecurity (03/09/2023)   Hunger Vital Sign    Worried About Running Out of Food in the Last Year: Never true    Ran Out of Food in the Last Year: Never true  Transportation Needs: No Transportation Needs (03/09/2023)   PRAPARE - Administrator, Civil Service (Medical): No    Lack of Transportation (Non-Medical): No  Physical Activity: Not on file  Stress: Not on file  Social Connections: Not on file   Education Assessment and Provision:  Detailed education and instructions provided on heart failure disease management including the following:  Signs and symptoms of Heart Failure When to call the physician Importance of daily weights Low sodium diet Fluid restriction Medication management Anticipated future follow-up appointments  Patient education given  on each of the above topics.  Patient acknowledges understanding via teach back method and acceptance of all instructions.  Education Materials:  "Living Better With Heart Failure" Booklet, HF zone tool, & Daily Weight Tracker Tool.  Patient has scale at home: yes Patient has pill box at home: yes    High Risk Criteria for Readmission and/or Poor Patient Outcomes: Heart failure hospital admissions (last 6 months): 2  No Show rate: 3 % Difficult social situation: No, lives with his wife.  Demonstrates medication adherence: Yes Primary Language: English Literacy level: Reading, writing,  and comprehension  Barriers of Care:   Diet/ fluid restrictions Daily weights  Considerations/Referrals:   Referral made to Heart Failure Pharmacist Stewardship: yes Referral made to Heart Failure CSW/NCM TOC: No Referral made to Heart & Vascular TOC clinic: Yes, 03/26/2023 @ 2 pm   Items for Follow-up on DC/TOC: Continued HF education Diet/ fluid restrictions Daily weights   Rhae Hammock, BSN, RN Heart Failure Teacher, adult education Only

## 2023-03-10 NOTE — Plan of Care (Signed)
  Problem: Coping: Goal: Ability to adjust to condition or change in health will improve Outcome: Progressing   Problem: Fluid Volume: Goal: Ability to maintain a balanced intake and output will improve Outcome: Progressing

## 2023-03-11 ENCOUNTER — Encounter (HOSPITAL_COMMUNITY): Payer: Self-pay | Admitting: Cardiovascular Disease

## 2023-03-11 ENCOUNTER — Encounter (HOSPITAL_COMMUNITY)
Admission: EM | Disposition: A | Discharge status: Home / Self Care | Payer: Self-pay | Source: Home / Self Care | Attending: Internal Medicine

## 2023-03-11 DIAGNOSIS — I5021 Acute systolic (congestive) heart failure: Secondary | ICD-10-CM | POA: Diagnosis not present

## 2023-03-11 HISTORY — PX: RIGHT HEART CATH AND CORONARY ANGIOGRAPHY: CATH118264

## 2023-03-11 LAB — CBC WITH DIFFERENTIAL/PLATELET
Abs Immature Granulocytes: 0.04 10*3/uL (ref 0.00–0.07)
Basophils Absolute: 0.1 10*3/uL (ref 0.0–0.1)
Basophils Relative: 1 %
Eosinophils Absolute: 0.2 10*3/uL (ref 0.0–0.5)
Eosinophils Relative: 2 %
HCT: 41.5 % (ref 39.0–52.0)
Hemoglobin: 13.2 g/dL (ref 13.0–17.0)
Immature Granulocytes: 0 %
Lymphocytes Relative: 20 %
Lymphs Abs: 1.8 10*3/uL (ref 0.7–4.0)
MCH: 28.9 pg (ref 26.0–34.0)
MCHC: 31.8 g/dL (ref 30.0–36.0)
MCV: 91 fL (ref 80.0–100.0)
Monocytes Absolute: 1.5 10*3/uL — ABNORMAL HIGH (ref 0.1–1.0)
Monocytes Relative: 17 %
Neutro Abs: 5.5 10*3/uL (ref 1.7–7.7)
Neutrophils Relative %: 60 %
Platelets: ADEQUATE 10*3/uL (ref 150–400)
RBC: 4.56 MIL/uL (ref 4.22–5.81)
RDW: 13.7 % (ref 11.5–15.5)
WBC: 9 10*3/uL (ref 4.0–10.5)
nRBC: 0 % (ref 0.0–0.2)

## 2023-03-11 LAB — BASIC METABOLIC PANEL
Anion gap: 8 (ref 5–15)
BUN: 23 mg/dL (ref 8–23)
CO2: 26 mmol/L (ref 22–32)
Calcium: 8.9 mg/dL (ref 8.9–10.3)
Chloride: 106 mmol/L (ref 98–111)
Creatinine, Ser: 1.66 mg/dL — ABNORMAL HIGH (ref 0.61–1.24)
GFR, Estimated: 40 mL/min — ABNORMAL LOW (ref >60)
Glucose, Bld: 111 mg/dL — ABNORMAL HIGH (ref 70–99)
Potassium: 3.9 mmol/L (ref 3.5–5.1)
Sodium: 140 mmol/L (ref 135–145)

## 2023-03-11 LAB — POCT I-STAT EG7
Acid-base deficit: 2 mmol/L (ref 0.0–2.0)
Acid-base deficit: 3 mmol/L — ABNORMAL HIGH (ref 0.0–2.0)
Bicarbonate: 23.2 mmol/L (ref 20.0–28.0)
Bicarbonate: 23.4 mmol/L (ref 20.0–28.0)
Calcium, Ion: 1 mmol/L — ABNORMAL LOW (ref 1.15–1.40)
Calcium, Ion: 1.08 mmol/L — ABNORMAL LOW (ref 1.15–1.40)
HCT: 38 % — ABNORMAL LOW (ref 39.0–52.0)
HCT: 38 % — ABNORMAL LOW (ref 39.0–52.0)
Hemoglobin: 12.9 g/dL — ABNORMAL LOW (ref 13.0–17.0)
Hemoglobin: 12.9 g/dL — ABNORMAL LOW (ref 13.0–17.0)
O2 Saturation: 53 %
O2 Saturation: 59 %
Potassium: 3.4 mmol/L — ABNORMAL LOW (ref 3.5–5.1)
Potassium: 3.5 mmol/L (ref 3.5–5.1)
Sodium: 131 mmol/L — ABNORMAL LOW (ref 135–145)
Sodium: 135 mmol/L (ref 135–145)
TCO2: 25 mmol/L (ref 22–32)
TCO2: 25 mmol/L (ref 22–32)
pCO2, Ven: 42.7 mm[Hg] — ABNORMAL LOW (ref 44–60)
pCO2, Ven: 44.1 mm[Hg] (ref 44–60)
pH, Ven: 7.33 (ref 7.25–7.43)
pH, Ven: 7.348 (ref 7.25–7.43)
pO2, Ven: 30 mm[Hg] — CL (ref 32–45)
pO2, Ven: 32 mm[Hg] (ref 32–45)

## 2023-03-11 LAB — GLUCOSE, CAPILLARY
Glucose-Capillary: 114 mg/dL — ABNORMAL HIGH (ref 70–99)
Glucose-Capillary: 137 mg/dL — ABNORMAL HIGH (ref 70–99)
Glucose-Capillary: 104 mg/dL — ABNORMAL HIGH (ref 70–99)
Glucose-Capillary: 106 mg/dL — ABNORMAL HIGH (ref 70–99)

## 2023-03-11 LAB — HEPARIN LEVEL (UNFRACTIONATED): Heparin Unfractionated: 0.68 [IU]/mL (ref 0.30–0.70)

## 2023-03-11 SURGERY — RIGHT HEART CATH AND CORONARY ANGIOGRAPHY
Anesthesia: LOCAL

## 2023-03-11 MED ORDER — FENTANYL CITRATE (PF) 100 MCG/2ML IJ SOLN
INTRAMUSCULAR | Status: DC | PRN
  Administered 2023-03-11: 25 ug via INTRAVENOUS

## 2023-03-11 MED ORDER — FENTANYL CITRATE (PF) 100 MCG/2ML IJ SOLN
INTRAMUSCULAR | Status: AC
  Filled 2023-03-11: qty 2

## 2023-03-11 MED ORDER — SODIUM CHLORIDE 0.9 % IV SOLN: 250.0000 mL | INTRAVENOUS | Status: DC | PRN

## 2023-03-11 MED ORDER — HYDRALAZINE HCL 20 MG/ML IJ SOLN: 10.0000 mg | INTRAMUSCULAR | Status: AC | PRN

## 2023-03-11 MED ORDER — SODIUM CHLORIDE 0.9 % IV SOLN
INTRAVENOUS | Status: AC
Start: 2023-03-11 — End: 2023-03-11

## 2023-03-11 MED ORDER — EMPAGLIFLOZIN 25 MG PO TABS
25.0000 mg | ORAL_TABLET | Freq: Every day | ORAL | Status: DC
  Administered 2023-03-11 – 2023-03-12 (×2): 25 mg via ORAL
  Filled 2023-03-11 (×2): qty 1

## 2023-03-11 MED ORDER — VERAPAMIL HCL 2.5 MG/ML IV SOLN
INTRAVENOUS | Status: AC
  Filled 2023-03-11: qty 2

## 2023-03-11 MED ORDER — FUROSEMIDE 40 MG PO TABS
80.0000 mg | ORAL_TABLET | Freq: Every day | ORAL | Status: DC
  Administered 2023-03-12: 80 mg via ORAL
  Filled 2023-03-11: qty 2

## 2023-03-11 MED ORDER — EZETIMIBE 10 MG PO TABS
10.0000 mg | ORAL_TABLET | Freq: Every day | ORAL | Status: DC
  Administered 2023-03-11 – 2023-03-12 (×2): 10 mg via ORAL
  Filled 2023-03-11 (×2): qty 1

## 2023-03-11 MED ORDER — SODIUM CHLORIDE 0.9% FLUSH
3.0000 mL | Freq: Two times a day (BID) | INTRAVENOUS | Status: DC
  Administered 2023-03-11 – 2023-03-12 (×3): 3 mL via INTRAVENOUS

## 2023-03-11 MED ORDER — SPIRONOLACTONE 12.5 MG HALF TABLET
12.5000 mg | ORAL_TABLET | Freq: Every day | ORAL | Status: DC
  Administered 2023-03-11 – 2023-03-12 (×2): 12.5 mg via ORAL
  Filled 2023-03-11 (×2): qty 1

## 2023-03-11 MED ORDER — HEPARIN SODIUM (PORCINE) 1000 UNIT/ML IJ SOLN
INTRAMUSCULAR | Status: AC
  Filled 2023-03-11: qty 10

## 2023-03-11 MED ORDER — HEPARIN SODIUM (PORCINE) 1000 UNIT/ML IJ SOLN
INTRAMUSCULAR | Status: DC | PRN
  Administered 2023-03-11: 5000 [IU] via INTRAVENOUS

## 2023-03-11 MED ORDER — MIDAZOLAM HCL 2 MG/2ML IJ SOLN
INTRAMUSCULAR | Status: DC | PRN
  Administered 2023-03-11: 1 mg via INTRAVENOUS

## 2023-03-11 MED ORDER — MIDAZOLAM HCL 2 MG/2ML IJ SOLN
INTRAMUSCULAR | Status: AC
Start: 2023-03-11 — End: ?
  Filled 2023-03-11: qty 2

## 2023-03-11 MED ORDER — LABETALOL HCL 5 MG/ML IV SOLN: 10.0000 mg | INTRAVENOUS | Status: AC | PRN

## 2023-03-11 MED ORDER — IOHEXOL 350 MG/ML SOLN
INTRAVENOUS | Status: DC | PRN
  Administered 2023-03-11: 60 mL via INTRA_ARTERIAL

## 2023-03-11 MED ORDER — FUROSEMIDE 10 MG/ML IJ SOLN
40.0000 mg | Freq: Two times a day (BID) | INTRAMUSCULAR | Status: AC
  Administered 2023-03-11: 40 mg via INTRAVENOUS
  Filled 2023-03-11: qty 4

## 2023-03-11 MED ORDER — EMPAGLIFLOZIN 10 MG PO TABS
10.0000 mg | ORAL_TABLET | Freq: Every day | ORAL | Status: DC
  Filled 2023-03-11: qty 1

## 2023-03-11 MED ORDER — HEPARIN (PORCINE) IN NACL 1000-0.9 UT/500ML-% IV SOLN
INTRAVENOUS | Status: DC | PRN
  Administered 2023-03-11 (×2): 500 mL

## 2023-03-11 MED ORDER — LIDOCAINE HCL (PF) 1 % IJ SOLN
INTRAMUSCULAR | Status: AC
  Filled 2023-03-11: qty 30

## 2023-03-11 MED ORDER — VERAPAMIL HCL 2.5 MG/ML IV SOLN
INTRAVENOUS | Status: DC | PRN
  Administered 2023-03-11: 10 mL via INTRA_ARTERIAL

## 2023-03-11 MED ORDER — SODIUM CHLORIDE 0.9% FLUSH: 3.0000 mL | INTRAVENOUS | Status: DC | PRN

## 2023-03-11 MED ORDER — LIDOCAINE HCL (PF) 1 % IJ SOLN
INTRAMUSCULAR | Status: DC | PRN
  Administered 2023-03-11: 5 mL

## 2023-03-11 SURGICAL SUPPLY — 12 items
CATH BALLN WEDGE 5F 110CM (CATHETERS) IMPLANT
CATH INFINITI 5FR AL1 (CATHETERS) IMPLANT
CATH LAUNCHER 5F EBU3.5 (CATHETERS) IMPLANT
DEVICE RAD COMP TR BAND LRG (VASCULAR PRODUCTS) IMPLANT
GLIDESHEATH SLEND SS 6F .021 (SHEATH) IMPLANT
GUIDEWIRE .025 260CM (WIRE) IMPLANT
GUIDEWIRE INQWIRE 1.5J.035X260 (WIRE) IMPLANT
INQWIRE 1.5J .035X260CM (WIRE) ×1 IMPLANT
MAT PREVALON FULL STRYKER (MISCELLANEOUS) IMPLANT
PACK CARDIAC CATHETERIZATION (CUSTOM PROCEDURE TRAY) ×2 IMPLANT
SET ATX-X65L (MISCELLANEOUS) IMPLANT
SHEATH GLIDE SLENDER 4/5FR (SHEATH) IMPLANT

## 2023-03-11 NOTE — Progress Notes (Signed)
Rounding Note    Patient Name: Robert Horn Date of Encounter: 03/11/2023  [facility] Cardiologist: Chilton Si, MD   Subjective   He is happy about his cath report.  Otherwise he looks forward to being discharged soon  Inpatient Medications    Scheduled Meds:  aspirin EC  81 mg Oral Daily   carvedilol  3.125 mg Oral BID WC   clopidogrel  75 mg Oral Daily   ferrous sulfate  325 mg Oral Q breakfast   furosemide  40 mg Intravenous BID   insulin aspart  0-5 Units Subcutaneous QHS   insulin aspart  0-9 Units Subcutaneous TID WC   rosuvastatin  40 mg Oral Daily   sacubitril-valsartan  1 tablet Oral BID   senna  1 tablet Oral BID   sodium chloride flush  3 mL Intravenous Q12H   Continuous Infusions:  sodium chloride Stopped (03/11/23 0926)   sodium chloride     PRN Meds: sodium chloride, acetaminophen **OR** acetaminophen, albuterol, bisacodyl, hydrALAZINE, hydrALAZINE, labetalol, polyethylene glycol, sodium chloride flush   Vital Signs    Vitals:   03/11/23 0828 03/11/23 0900 03/11/23 0904 03/11/23 0945  BP:    125/83  Pulse:    61  Resp:      Temp:      TempSrc:      SpO2: 95% (!) 87% 96% 93%  Weight:      Height:        Intake/Output Summary (Last 24 hours) at 03/11/2023 1058 Last data filed at 03/11/2023 3086 Gross per 24 hour  Intake 263.28 ml  Output 1600 ml  Net -1336.72 ml      03/11/2023    4:38 AM 03/10/2023   12:34 AM 03/09/2023    5:19 AM  Last 3 Weights  Weight (lbs) 217 lb 6 oz 218 lb 4.1 oz 220 lb  Weight (kg) 98.6 kg 99 kg 99.791 kg        ECG    No new - Personally Reviewed  Physical Exam   Vitals:   03/11/23 0904 03/11/23 0945  BP:  125/83  Pulse:  61  Resp:    Temp:    SpO2: 96% 93%    GEN: No acute distress.   Neck: No JVD Cardiac: RRR, no murmurs, rubs, or gallops.  Respiratory: nl wob, GI: Soft, nontender, non-distended  MS: No edema; No deformity. Neuro:  Nonfocal  Psych: Normal affect    Labs    High Sensitivity Troponin:   Recent Labs  Lab 02/28/23 0951 02/28/23 1223 03/09/23 0526 03/09/23 0924 03/10/23 0242  TROPONINIHS 88* 81* 55* 118* 1,325*     Chemistry Recent Labs  Lab 03/09/23 0526 03/10/23 0242 03/11/23 0223  NA 140 140 140  K 3.6 3.6 3.9  CL 106 108 106  CO2 24 24 26   GLUCOSE 125* 111* 111*  BUN 22 24* 23  CREATININE 1.61* 1.61* 1.66*  CALCIUM 9.0 8.8* 8.9  MG  --  2.4  --   GFRNONAA 41* 41* 40*  ANIONGAP 10 8 8     Lipids No results for input(s): "CHOL", "TRIG", "HDL", "LABVLDL", "LDLCALC", "CHOLHDL" in the last 168 hours.  Hematology Recent Labs  Lab 03/09/23 0526 03/10/23 0242 03/11/23 0223  WBC 7.5 8.7 9.0  RBC 4.36 4.21* 4.56  HGB 12.8* 12.3* 13.2  HCT 40.6 38.0* 41.5  MCV 93.1 90.3 91.0  MCH 29.4 29.2 28.9  MCHC 31.5 32.4 31.8  RDW 13.5 13.7 13.7  PLT 177 PLATELET  CLUMPS NOTED ON SMEAR, UNABLE TO ESTIMATE PLATELET CLUMPS NOTED ON SMEAR, COUNT APPEARS ADEQUATE   Thyroid No results for input(s): "TSH", "FREET4" in the last 168 hours.  BNP Recent Labs  Lab 03/09/23 0526  BNP 612.7*    DDimer No results for input(s): "DDIMER" in the last 168 hours.   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 Mild elevation right heart pressures PCWP 13 mmHg Cardiac output 4.3 L/min CI 1.95 Recommendations: Continue medical management of non-ischemic cardiomyopathy. He has been adequately diuresed.   NM Pulmonary Perfusion Result Date: 03/10/2023 CLINICAL DATA:  Dyspnea, recent surgery EXAM: NUCLEAR MEDICINE PERFUSION LUNG SCAN TECHNIQUE: Perfusion images were obtained in multiple projections after intravenous injection of radiopharmaceutical. Ventilation scans intentionally deferred if perfusion scan and chest x-ray adequate for interpretation during COVID 19 epidemic. RADIOPHARMACEUTICALS:  4.2 mCi Tc-54m MAA IV COMPARISON:  Chest  x-ray 03/10/2023 FINDINGS: Planar images of the chest are obtained after radiotracer administration. There are no wedge-shaped perfusion defects to suggest pulmonary embolus. IMPRESSION: 1. No evidence of pulmonary embolus. Electronically Signed   By: Sharlet Salina M.D.   On: 03/10/2023 16:17   DG Chest 2 View Result Date: 03/10/2023 CLINICAL DATA:  [id number] Dyspnea [id number] EXAM: CHEST - 2 VIEW COMPARISON:  03/09/2023 FINDINGS: Stable heart size. Prior TAVR. Aortic atherosclerosis. Improved aeration of the lung bases compared to prior. No new airspace consolidation. No pleural effusion or pneumothorax. IMPRESSION: Improved aeration of the lung bases compared to prior. No new airspace consolidation. Electronically Signed   By: Duanne Guess D.O.   On: 03/10/2023 15:00   ECHOCARDIOGRAM COMPLETE Result Date: 03/09/2023    ECHOCARDIOGRAM REPORT   Patient Name:   Robert Horn Date of Exam: 03/09/2023 Medical Rec #:  [id number]     Height:       72.0 in Accession #:    [id number]    Weight:       220.0 lb Date of Birth:  05/30/1933     BSA:          2.219 m Patient Age:    89 years      BP:           128/95 mmHg Patient Gender: M             HR:           53 bpm. Exam Location:  Inpatient Procedure: 2D Echo, Cardiac Doppler, Color Doppler and 3D Echo Indications:    R94.31 Abnormal EKG  History:        Patient has prior history of Echocardiogram examinations, most                 recent 03/13/2021. CHF, Abnormal ECG, TIA, Aortic Valve Disease;                 Risk Factors:Hypertension and Dyslipidemia. Severe aortic                 stenosis. TAVR.                 Aortic Valve: 26 mm Edwards Sapien prosthetic, stented (TAVR)                 valve is present in the aortic position. Procedure Date:                 12/15/2016.  Sonographer:    Sheralyn Boatman RDCS  Referring Phys: [id number] [facility] IMPRESSIONS  1. Left ventricular ejection fraction, by estimation, is 25 to 30%. Left ventricular ejection fraction by 3D  volume is 31 %. The left ventricle has severely decreased function. The left ventricle demonstrates global hypokinesis. The left ventricular internal cavity size was mildly dilated. Left ventricular diastolic parameters are consistent with Grade III diastolic dysfunction (restrictive). There is the interventricular septum is flattened in systole and diastole, consistent with right ventricular  pressure and volume overload.  2. Right ventricular systolic function is low normal. The right ventricular size is moderately enlarged. Mildly increased right ventricular wall thickness.  3. Left atrial size was moderately dilated.  4. Right atrial size was moderately dilated.  5. There is no evidence of cardiac tamponade.  6. The mitral valve is degenerative. Mild mitral valve regurgitation.  7. The aortic valve has been repaired/replaced. Aortic valve regurgitation is not visualized. There is a 26 mm Edwards Sapien prosthetic (TAVR) valve present in the aortic position. Procedure Date: 12/15/2016. Aortic valve mean gradient measures 14.3 mmHg. Aortic valve Vmax measures 2.58 m/s. Aortic valve acceleration time measures 67 msec.  8. Aortic dilatation noted. There is dilatation of the ascending aorta, measuring 38 mm. There is dilatation of the ascending aorta, measuring 38 mm. Comparison(s): Prior images reviewed side by side. The left ventricular function is significantly worse. The left ventricular diastolic function is significantly worse. TAVR gradients are lower due to reduction in LV function. FINDINGS  Left Ventricle: Left ventricular ejection fraction, by estimation, is 25 to 30%. Left ventricular ejection fraction by 3D volume is 31 %. The left ventricle has severely decreased function. The left ventricle demonstrates global hypokinesis. The left ventricular internal cavity size was mildly dilated. There is no left ventricular hypertrophy. The interventricular septum is flattened in systole and diastole, consistent  with right ventricular pressure and volume overload. Left ventricular diastolic function could not be evaluated due to atrial fibrillation. Left ventricular diastolic parameters are consistent with Grade III diastolic dysfunction (restrictive). Right Ventricle: The right ventricular size is moderately enlarged. Mildly increased right ventricular wall thickness. Right ventricular systolic function is low normal. Left Atrium: Left atrial size was moderately dilated. Right Atrium: Right atrial size was moderately dilated. Pericardium: Trivial pericardial effusion is present. There is no evidence of cardiac tamponade. Mitral Valve: The mitral valve is degenerative in appearance. Mild mitral valve regurgitation. Tricuspid Valve: The tricuspid valve is grossly normal. Tricuspid valve regurgitation is trivial. No evidence of tricuspid stenosis. Aortic Valve: The aortic valve has been repaired/replaced. Aortic valve regurgitation is not visualized. Aortic valve mean gradient measures 14.3 mmHg. Aortic valve peak gradient measures 26.6 mmHg. Aortic valve area, by VTI measures 1.50 cm. There is a  26 mm Edwards Sapien prosthetic, stented (TAVR) valve present in the aortic position. Procedure Date: 12/15/2016. Pulmonic Valve: The pulmonic valve was normal in structure. Pulmonic valve regurgitation is not visualized. No evidence of pulmonic stenosis. Aorta: Aortic dilatation noted and the aortic root is normal in size and structure. There is dilatation of the ascending aorta, measuring 38 mm. There is dilatation of the ascending aorta, measuring 38 mm. IAS/Shunts: No atrial level shunt detected by color flow Doppler.  LEFT VENTRICLE PLAX 2D LVIDd:         5.80 cm LVIDs:         5.00 cm LV PW:         1.10 cm         3D Volume EF LV IVS:  0.90 cm         LV 3D EF:    Left LVOT diam:     2.55 cm                      ventricul LV SV:         75                           ar LV SV Index:   34                           ejection  LVOT Area:     5.11 cm                     fraction                                             by 3D                                             volume is LV Volumes (MOD)                            31 %. LV vol d, MOD    167.0 ml A2C: LV vol d, MOD    152.0 ml      3D Volume EF: A4C:                           3D EF:        31 % LV vol s, MOD    113.0 ml      LV EDV:       163 ml A2C:                           LV ESV:       112 ml LV vol s, MOD    110.0 ml      LV SV:        51 ml A4C: LV SV MOD A2C:   54.0 ml LV SV MOD A4C:   152.0 ml LV SV MOD BP:    50.9 ml RIGHT VENTRICLE            IVC RV S prime:     9.68 cm/s  IVC diam: 1.90 cm TAPSE (M-mode): 1.6 cm LEFT ATRIUM            Index        RIGHT ATRIUM           Index LA diam:      5.40 cm  2.43 cm/m   RA Area:     18.50 cm LA Vol (A2C): 116.0 ml 52.28 ml/m  RA Volume:   48.80 ml  22.00 ml/m LA Vol (A4C): 48.8 ml  21.97 ml/m  AORTIC VALVE AV Area (Vmax):    1.55 cm AV Area (Vmean):   1.48 cm AV Area (VTI):     1.50 cm AV Vmax:  257.67 cm/s AV Vmean:          176.667 cm/s AV VTI:            0.502 m AV Peak Grad:      26.6 mmHg AV Mean Grad:      14.3 mmHg LVOT Vmax:         78.35 cm/s LVOT Vmean:        51.150 cm/s LVOT VTI:          0.148 m LVOT/AV VTI ratio: 0.29  AORTA Ao Root diam: 3.80 cm Ao Asc diam:  3.80 cm MITRAL VALVE                TRICUSPID VALVE MV Area (PHT): 6.63 cm     TR Peak grad:   26.4 mmHg MV Decel Time: 115 msec     TR Vmax:        257.00 cm/s MV E velocity: 114.25 cm/s                             SHUNTS                             Systemic VTI:  0.15 m                             Systemic Diam: 2.55 cm Thurmon Fair MD Electronically signed by Thurmon Fair MD Signature Date/Time: 03/09/2023/4:23:09 PM    Final     Cardiac Studies  TTE 03/09/2023 1. Left ventricular ejection fraction, by estimation, is 25 to 30%. Left  ventricular ejection fraction by 3D volume is 31 %. The left ventricle has  severely decreased  function. The left ventricle demonstrates global  hypokinesis. The left ventricular  internal cavity size was mildly dilated. Left ventricular diastolic  parameters are consistent with Grade III diastolic dysfunction  (restrictive). There is the interventricular septum is flattened in  systole and diastole, consistent with right ventricular   pressure and volume overload.   2. Right ventricular systolic function is low normal. The right  ventricular size is moderately enlarged. Mildly increased right  ventricular wall thickness.   3. Left atrial size was moderately dilated.   4. Right atrial size was moderately dilated.   5. There is no evidence of cardiac tamponade.   6. The mitral valve is degenerative. Mild mitral valve regurgitation.   7. The aortic valve has been repaired/replaced. Aortic valve  regurgitation is not visualized. There is a 26 mm Edwards Sapien  prosthetic (TAVR) valve present in the aortic position. Procedure Date:  12/15/2016. Aortic valve mean gradient measures 14.3  mmHg. Aortic valve Vmax measures 2.58 m/s. Aortic valve acceleration time  measures 67 msec.   8. Aortic dilatation noted. There is dilatation of the ascending aorta,  measuring 38 mm. There is dilatation of the ascending aorta, measuring 38  mm.   Comparison(s): Prior images reviewed side by side. The left ventricular  function is significantly worse. The left ventricular diastolic function  is significantly worse. TAVR gradients are lower due to reduction in LV  function.    Patient Profile     Tyrann Gruett is a 89 y.o. male with a hx of nonischemic cardiomyopathy, chronic heart failure with mildly reduced EF, TAVR 2018, PAD status post partial right foot amputations, coronary calcifications with patent coronary arteries on  catheterization 2017, TIA 02/2021, CKD, diabetes, colon cancer status post colectomy hypertension, hyperlipidemia, who is being seen 03/10/2023 for the evaluation of shortness  of breath at the request of Dr. Pola Corn.   Assessment & Plan    Decompensated cardiomyopathy Troponin elevation After procedure on his foot he developed acute shortness of breath and volume overload.  His troponin also had a delta.  He denied chest pain and had no concerning acute ischemic changes on his EKG. -His left heart cath showed no obstructive CAD.  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  Will plan for 1 more day of IV diuresis and then transition him to oral diuretics tomorrow.  He was on Lasix 60 mg daily at home, will increase to 80 mg daily on discharge -continue carvedilol 3.125 mg BID  -Start spironolactone 12.5 mg daily -Continue Entresto 49/51 mg twice daily  Stable chronic conditions TAVR 2018 Mean gradient 14.3.  V-max 2.58.   PAD Status post multiple R partial foot amputation, with recent debridement/revision 02/24/2023. DAPT as above and statin.    CKD Renal function looks stable at 1.6.  At baseline.   Diabetes A1c 6.4%.   From a cardiology perspective he should be able to discharge tomorrow  Time Spent Directly with Patient:  I have spent a total of 35 minutes with the patient reviewing hospital notes, telemetry, EKGs, labs and examining the patient as well as establishing an assessment and plan that was discussed personally with the patient.     For questions or updates, please contact [facility] Please consult [email or web address] for contact info under        Signed, Maisie Fus, MD  03/11/2023, 10:58 AM

## 2023-03-11 NOTE — Plan of Care (Signed)

## 2023-03-11 NOTE — Progress Notes (Signed)
   Heart Failure Stewardship Pharmacist Progress Note   PCP: Renford Dills, MD PCP-Cardiologist: Chilton Si, MD    HPI:  89 yo M with PMH of CHF, CAD, aortic stenosis s/p TAVR, T2DM, HTN, HLD, PAD, CKD, colon cancer s/p colectomy, and recent partial foot amputation.   Presented to the ED on 12/10 with shortness of breath, LE edema, and weakness. He was instructed to hold his lasix a few days around surgery. CXR with bibasilar atelectasis and small left pleural effusion. ECHO 12/10 with LVEF 25-30% (was 45-50% in 2022), G3DD, RV low normal, mild MR.   Taken for [facility] on 12/12 and found to have no angiographic evidence of CAD, wedge 13, CO 4.3 and CI 1.95.   His wife reports that he was off lasix several days around the surgery because of scheduling issues. Otherwise, his fluid has been well managed for some time. Patient is currently sleeping.   Current HF Medications: Diuretic: furosemide 40 mg IV BID Beta Blocker: carvedilol 3.125 mg BID ACE/ARB/ARNI: Entresto 49/51 mg BID  Prior to admission HF Medications: Diuretic: furosemide 40 mg daily Beta blocker: carvedilol 3.125 mg BID ACE/ARB/ARNI: Entresto 49/51 mg BID SGLT2i: Jardiance 25 mg daily  Pertinent Lab Values: Serum creatinine 1.66, BUN 23, Potassium 3.9, Sodium 140, BNP 612.7, Magnesium 2.4, A1c 6.4   Vital Signs: Weight: 217 lbs (admission weight: 220 lbs) Blood pressure: 120/80s  Heart rate: 60-70s  I/O: net -1.1L yesterday; net -3.7L since admission  Medication Assistance / Insurance Benefits Check: Does the patient have prescription insurance?  Yes Type of insurance plan: [employer] Medicare  Outpatient Pharmacy:  Prior to admission outpatient [facility] Is the patient willing to use [facility] pharmacy at discharge? Yes Is the patient willing to transition their outpatient pharmacy to utilize a [facility] outpatient pharmacy?   No    Assessment: 1. Acute on chronic systolic CHF (LVEF 25-30%), due to NICM.  NYHA class II symptoms. - On furosemide 40 mg IV BID, wedge 13. Consider stopping IV lasix today. Strict I/Os and daily weights. Keep K>4 and Mg>2. - Continue carvedilol 3.125 mg BID - Continue Entresto 49/51 mg BID - Consider starting spironolactone 12.5 mg daily today - Recommend to restart Jardiance by discharge   Plan: 1) Medication changes recommended at this time: - Stop IV lasix - Start spironolactone 12.5 mg daily  - Restart Jardiance at discharge  2) Patient assistance: - None pending  3)  Education  - Patient has been educated on current HF medications and potential additions to HF medication regimen - Patient verbalizes understanding that over the next few months, these medication doses may change and more medications may be added to optimize HF regimen - Patient has been educated on basic disease state pathophysiology and goals of therapy   Sharen Hones, PharmD, BCPS Heart Failure Engineer, building services Phone [phone]

## 2023-03-11 NOTE — Progress Notes (Signed)
PHARMACY - ANTICOAGULATION CONSULT NOTE  Pharmacy Consult for IV  heparin Indication: R/o PE, r/o ACS  Allergies  Allergen Reactions   Ticagrelor Other (See Comments)    Unknown     Patient Measurements: Height: 6' (182.9 cm) Weight: 98.6 kg (217 lb 6 oz) IBW/kg (Calculated) : 77.6 Heparin Dosing Weight: 99 kg  Vital Signs: Temp: 97.9 F (36.6 C) (12/12 0438) Temp Source: Oral (12/12 0438) BP: 128/81 (12/12 0438) Pulse Rate: 72 (12/12 0438)  Labs: Recent Labs    03/09/23 0526 03/09/23 0924 03/10/23 0242 03/10/23 2121 03/11/23 0223  HGB 12.8*  --  12.3*  --  13.2  HCT 40.6  --  38.0*  --  41.5  PLT 177  --  PLATELET CLUMPS NOTED ON SMEAR, UNABLE TO ESTIMATE  --  PLATELET CLUMPS NOTED ON SMEAR, COUNT APPEARS ADEQUATE  HEPARINUNFRC  --   --   --  0.70 0.68  CREATININE 1.61*  --  1.61*  --  1.66*  TROPONINIHS 55* 118* 1,325*  --   --     Estimated Creatinine Clearance: 38.1 mL/min (A) (by C-G formula based on SCr of 1.66 mg/dL (H)).   Assessment: 89 yo male admitted with SOB, troponins elevated and pharmacy asked to start anticoagulation with IV heparin. No anticoagulants noted PTA.  Heparin level 0.68 (therapeutic) on infusion at 1300 units/hr. H/H stable, pltc clumped. R/LHC planned today.  Goal of Therapy:  Heparin level 0.3-0.7 units/ml Monitor platelets by anticoagulation protocol: Yes   Plan:  Continue heparin drip at 1300 units/hr Daily heparin level and CBC  Fredonia Highland, PharmD, [place], [facility] Clinical Pharmacist [phone] Please check AMION for all [facility] Pharmacy numbers 03/11/2023

## 2023-03-11 NOTE — Interval H&P Note (Signed)
History and Physical Interval Note:  03/11/2023 8:20 AM  Robert Horn  has presented today for surgery, with the diagnosis of nstemi.  The various methods of treatment have been discussed with the patient and family. After consideration of risks, benefits and other options for treatment, the patient has consented to  Procedure(s): RIGHT/LEFT HEART CATH AND CORONARY ANGIOGRAPHY (N/A) as a surgical intervention.  The patient's history has been reviewed, patient examined, no change in status, stable for surgery.  I have reviewed the patient's chart and labs.  Questions were answered to the patient's satisfaction.    Cath Lab Visit (complete for each Cath Lab visit)  Clinical Evaluation Leading to the Procedure:   ACS: Yes.    Non-ACS:    Anginal Classification: CCS II  Anti-ischemic medical therapy: Maximal Therapy (2 or more classes of medications)  Non-Invasive Test Results: No non-invasive testing performed  Prior CABG: No previous CABG        Robert Horn

## 2023-03-11 NOTE — Progress Notes (Signed)
PROGRESS NOTE  Robert Horn  DOB: [birth year]  PCP: Renford Dills, MD ZOX:[id number]  DOA: 03/09/2023  LOS: 1 day  Hospital Day: 3  Brief narrative: Robert Horn is a 89 y.o. male with PMH significant for DM2, HTN, HLD, CAD, CHF, PAD, CKD, colon cancer status post colectomy, recent right partial foot amputation at [facility]. Patient presented to the ED today with concern of dyspnea, fatigue, progressively worsening edema of lower extremities and abdomen. Patient had CHF with last known EF 45 to 50% in 2022.  He used to follow with Dr. Katrinka Blazing, Dr. Eldridge Dace. Patient recently had right foot partial amputation at [facility].  That surgery had to be canceled 3 times and ultimately got done in the last week of November.  During that interval, patient was made to hold Lasix for multiple days.  Surgery was successful but patient and his wife state that patient breathing has not been normal since then.  12/1, patient was seen at the ED for dyspnea.  He was given IV Lasix.  Felt better with diuresis discharged home to follow-up with PCP.  12/10, patient woke up early in the morning with shortness of breath and returned to ED.  In the ED, patient was hemodynamically stable Clinically no evidence of volume overload.  Chest x-ray did not show pulm edema However he continued to have chest tightness.  BNP was elevated.   Troponin was elevated.  EKG without significant ST-T wave changes Patient was given 1 dose of Lasix 80 mg Admitted to [facility]  Echocardiogram repeated showed significant drop in EF to 25 to 30% Cardiology consulted  Subjective: Patient was seen and examined this morning.   Lying on bed.  Not in distress.  Underwent cardiac cath this morning.  No obstructive CAD noted. Noted plan from cardiology to continue diuresis for 1 more day.  Assessment/Plan: Acute exacerbation of CHF Nonischemic cardiomyopathy Essential hypertension Presented with shortness of breath, elevated BNP Echocardiogram  with EF 25 to 30%, global LV hypokinesis.  EF is low compared to 45 to 50% from 2022. Cardiac cath today 12/12 did not show any coronary obstruction. Currently on IV Lasix 40 mg twice daily.  Noted a plan from cardiology to continue 1 more day of diuresis PTA meds- Coreg 3.125 mg twice daily, Lasix 40 mg daily, Jardiance 25 mg daily, Entresto 49/51 mg twice daily Continued on Coreg and Entresto.  Jardiance remains on hold Net IO Since Admission: -3,661.72 mL [03/11/23 1138] Continue to monitor for daily intake output, weight, blood pressure, BNP, renal function and electrolytes. Recent Labs  Lab 03/09/23 0526 03/10/23 0242 03/11/23 0223  BNP 612.7*  --   --   BUN 22 24* 23  CREATININE 1.61* 1.61* 1.66*  NA 140 140 140  K 3.6 3.6 3.9  MG  --  2.4  --    Elevated troponin HLD Troponin elevation is likely due to demand ischemia from CHF exacerbation Cardiac cath without any evidence of coronary obstruction PTA meds- aspirin, Plavix, Crestor Continue all Recent Labs    03/09/23 0526 03/09/23 0924 03/10/23 0242  TROPONINIHS 55* 118* 1,325*   PAD S/p recent right partial foot amputation at [facility]  CKD 3B Creatinine at baseline. Recent Labs    08/11/22 1607 02/28/23 0944 03/09/23 0526 03/10/23 0242 03/11/23 0223  BUN 24 20 22  24* 23  CREATININE 1.54* 1.46* 1.61* 1.61* 1.66*   Type 2 diabetes mellitus A1c 6.4 on 03/10/2023 PTA meds-Ozempic weekly on Wednesday Currently on SSI/Accu-Cheks Recent Labs  Lab 03/10/23  1222 03/10/23 1619 03/10/23 2102 03/11/23 0509 03/11/23 1118  GLUCAP 114* 107* 145* 114* 137*   Colon cancer  S/p colectomy  Mobility: Encourage ambulation  Goals of care   Code Status: Full Code    DVT prophylaxis:     Antimicrobials: None Fluid: None Consultants: None Family Communication: Wife not at bedside today   Status: Observation Level of care:  Telemetry Cardiac   Patient is from: Home Needs to continue in-hospital care:  Ongoing IV diuresis Anticipated d/c to: Hopefully home tomorrow   Diet:  Diet Order             Diet Heart Room service appropriate? Yes; Fluid consistency: Thin  Diet effective now                   Scheduled Meds:  aspirin EC  81 mg Oral Daily   carvedilol  3.125 mg Oral BID WC   clopidogrel  75 mg Oral Daily   empagliflozin  25 mg Oral Daily   ezetimibe  10 mg Oral Daily   ferrous sulfate  325 mg Oral Q breakfast   furosemide  40 mg Intravenous BID   [START ON 03/12/2023] furosemide  80 mg Oral Daily   insulin aspart  0-5 Units Subcutaneous QHS   insulin aspart  0-9 Units Subcutaneous TID WC   rosuvastatin  40 mg Oral Daily   sacubitril-valsartan  1 tablet Oral BID   senna  1 tablet Oral BID   sodium chloride flush  3 mL Intravenous Q12H   spironolactone  12.5 mg Oral Daily    PRN meds: sodium chloride, acetaminophen **OR** acetaminophen, albuterol, bisacodyl, hydrALAZINE, hydrALAZINE, labetalol, polyethylene glycol, sodium chloride flush   Infusions:   sodium chloride Stopped (03/11/23 0926)   sodium chloride      Antimicrobials: Anti-infectives (From admission, onward)    None       Objective: Vitals:   03/11/23 0945 03/11/23 1100  BP: 125/83 90/65  Pulse: 61 66  Resp:    Temp:    SpO2: 93% 90%    Intake/Output Summary (Last 24 hours) at 03/11/2023 1138 Last data filed at 03/11/2023 0454 Gross per 24 hour  Intake 263.28 ml  Output 1600 ml  Net -1336.72 ml   Filed Weights   03/09/23 0519 03/10/23 0034 03/11/23 0438  Weight: 99.8 kg 99 kg 98.6 kg   Weight change: -0.4 kg Body mass index is 29.48 kg/m.   Physical Exam: General exam: Pleasant, elderly African-American male. Skin: No rashes, lesions or ulcers. HEENT: Atraumatic, normocephalic, no obvious bleeding Lungs: Clear to auscultation bilaterally CVS: Regular rate and rhythm, no murmur GI/Abd soft, nontender, nondistended, bowel sound present CNS: Alert, awake, oriented x  3 Psychiatry: Anxious with the new echo finding Extremities: No pedal edema, no calf tenderness  Data Review: I have personally reviewed the laboratory data and studies available.  F/u labs ordered Unresulted Labs (From admission, onward)     Start     Ordered   03/11/23 0500  Basic metabolic panel  Daily,   R     Question:  Specimen collection method  Answer:  Lab=Lab collect   03/10/23 0952   03/11/23 0500  CBC  Daily,   R     Question:  Specimen collection method  Answer:  Lab=Lab collect   03/10/23 1234            Total time spent in review of labs and imaging, patient evaluation, formulation of plan, documentation and  communication with family: 45 minutes  Signed, Lorin Glass, MD [facility] 03/11/2023

## 2023-03-11 NOTE — Plan of Care (Signed)
  Problem: Coping: Goal: Ability to adjust to condition or change in health will improve Outcome: Progressing   Problem: Fluid Volume: Goal: Ability to maintain a balanced intake and output will improve Outcome: Progressing

## 2023-03-12 ENCOUNTER — Other Ambulatory Visit (HOSPITAL_COMMUNITY): Payer: Self-pay

## 2023-03-12 DIAGNOSIS — I214 Non-ST elevation (NSTEMI) myocardial infarction: Secondary | ICD-10-CM | POA: Diagnosis not present

## 2023-03-12 DIAGNOSIS — I5021 Acute systolic (congestive) heart failure: Secondary | ICD-10-CM | POA: Diagnosis not present

## 2023-03-12 LAB — CBC
HCT: 40.6 % (ref 39.0–52.0)
Hemoglobin: 13.1 g/dL (ref 13.0–17.0)
MCH: 29 pg (ref 26.0–34.0)
MCHC: 32.3 g/dL (ref 30.0–36.0)
MCV: 90 fL (ref 80.0–100.0)
Platelets: UNDETERMINED 10*3/uL (ref 150–400)
RBC: 4.51 MIL/uL (ref 4.22–5.81)
RDW: 13.9 % (ref 11.5–15.5)
WBC: 7.2 10*3/uL (ref 4.0–10.5)
nRBC: 0 % (ref 0.0–0.2)

## 2023-03-12 LAB — GLUCOSE, CAPILLARY
Glucose-Capillary: 126 mg/dL — ABNORMAL HIGH (ref 70–99)
Glucose-Capillary: 122 mg/dL — ABNORMAL HIGH (ref 70–99)

## 2023-03-12 LAB — BASIC METABOLIC PANEL
Anion gap: 11 (ref 5–15)
BUN: 30 mg/dL — ABNORMAL HIGH (ref 8–23)
CO2: 23 mmol/L (ref 22–32)
Calcium: 8.7 mg/dL — ABNORMAL LOW (ref 8.9–10.3)
Chloride: 104 mmol/L (ref 98–111)
Creatinine, Ser: 1.77 mg/dL — ABNORMAL HIGH (ref 0.61–1.24)
GFR, Estimated: 37 mL/min — ABNORMAL LOW (ref >60)
Glucose, Bld: 111 mg/dL — ABNORMAL HIGH (ref 70–99)
Potassium: 3.6 mmol/L (ref 3.5–5.1)
Sodium: 138 mmol/L (ref 135–145)

## 2023-03-12 MED ORDER — SPIRONOLACTONE 25 MG PO TABS
12.5000 mg | ORAL_TABLET | Freq: Every day | ORAL | 0 refills | Status: DC
  Filled 2023-03-12: qty 45, 90d supply, fill #0

## 2023-03-12 MED ORDER — FUROSEMIDE 80 MG PO TABS
80.0000 mg | ORAL_TABLET | Freq: Every day | ORAL | 0 refills | Status: DC
  Filled 2023-03-12: qty 90, 90d supply, fill #0

## 2023-03-12 MED ORDER — FUROSEMIDE 80 MG PO TABS: 80.0000 mg | ORAL_TABLET | Freq: Every day | ORAL | 0 refills | Status: DC

## 2023-03-12 MED ORDER — SPIRONOLACTONE 25 MG PO TABS: 12.5000 mg | ORAL_TABLET | Freq: Every day | ORAL | 0 refills | Status: DC

## 2023-03-12 NOTE — Discharge Summary (Signed)
Physician Discharge Summary  Robert Horn ZOX:[id number] DOB: 1933/05/06 DOA: 03/09/2023  PCP: Renford Dills, MD  Admit date: 03/09/2023 Discharge date: 03/12/2023  Admitted From: Home Discharge disposition: Home  Recommendations at discharge:  Lasix dose has been increased from 60 mg daily to 80 mg daily.  Aldactone has been added.   Continue carvedilol, Entresto and Jardiance as before Follow-up with PCP next 1 to 2 weeks for repeat blood work Follow-up with cardiology as an outpatient  Brief narrative: Robert Horn is a 89 y.o. male with PMH significant for DM2, HTN, HLD, CAD, CHF, PAD, CKD, colon cancer status post colectomy, recent right partial foot amputation at [facility]. Patient presented to the ED today with concern of dyspnea, fatigue, progressively worsening edema of lower extremities and abdomen. Patient had CHF with last known EF 45 to 50% in 2022.  He used to follow with Dr. Katrinka Blazing, Dr. Eldridge Dace. Patient recently had right foot partial amputation at [facility]  That surgery had to be canceled 3 times and ultimately got done in the last week of November.  During that interval, patient was made to hold Lasix for multiple days.  Surgery was successful but patient and his wife state that patient breathing has not been normal since then.  12/1, patient was seen at the ED for dyspnea.  He was given IV Lasix.  Felt better with diuresis discharged home to follow-up with PCP.  12/10, patient woke up early in the morning with shortness of breath and returned to ED.  In the ED, patient was hemodynamically stable Clinically no evidence of volume overload.  Chest x-ray did not show pulm edema However he continued to have chest tightness.  BNP was elevated.   Troponin was elevated.  EKG without significant ST-T wave changes Patient was given 1 dose of Lasix 80 mg Admitted to [facility]  Echocardiogram repeated showed significant drop in EF to 25 to 30% Cardiology  consulted  Subjective: Patient was seen and examined this morning.   Sitting up in commode.  Not in distress.  Not required supplemental oxygen.  Wife at bedside. Seen by cardiology follow-up this morning. 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  They requested me for work note to keep him off for 8 weeks.  Assessment/Plan: Acute exacerbation of CHF Nonischemic cardiomyopathy Essential hypertension Presented with shortness of breath, elevated BNP Echocardiogram with EF 25 to 30%, global LV hypokinesis.  EF is low compared to 45 to 50% from 2022. Cardiology was consulted Cardiac cath 12/12 did not show any coronary obstruction.  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 He was adequately diuresed with IV Lasix, more than 5.5 L of negative balance. PTA, patient was on Lasix 60 mg daily.  Lasix dose has been increased to 80 mg daily. Post discharge, to continue carvedilol at 3.125 mg twice daily, Entresto 49/51 mg twice daily and Jardiance 25 mg daily.  Aldactone 12.5 mg daily has been added as well Patient has upcoming follow-up with Dr. Duke Salvia.  Elevated troponin HLD Troponin elevation is likely due to demand ischemia from CHF exacerbation Cardiac cath without any evidence of coronary obstruction PTA meds- aspirin, Plavix, Crestor Continue all Recent Labs    03/10/23 0242  TROPONINIHS 1,325*   PAD S/p recent right partial foot amputation at [facility]  CKD 3B Creatinine somewhat elevated today than baseline, likely due to IV diuresis.  To repeat outpatient  BMP in next 1 week with PCP. Recent Labs    08/11/22 1607 02/28/23 0944 03/09/23 0526 03/10/23 0242 03/11/23 0223 03/12/23 0229  BUN 24 20 22  24* 23 30*  CREATININE 1.54* 1.46* 1.61* 1.61* 1.66* 1.77*   Type 2 diabetes mellitus A1c 6.4 on 03/10/2023 PTA meds-Ozempic weekly on Wednesday Currently on  SSI/Accu-Cheks Recent Labs  Lab 03/11/23 0509 03/11/23 1118 03/11/23 1616 03/11/23 2146 03/12/23 0558  GLUCAP 114* 137* 104* 106* 126*   Colon cancer  S/p colectomy  Goals of care   Code Status: Full Code   Diet:  Diet Order             Diet - low sodium heart healthy           Diet Heart Room service appropriate? Yes; Fluid consistency: Thin  Diet effective now                   Nutritional status:  Body mass index is 29.66 kg/m.       Wounds:  - Incision (Closed) 03/10/23 Foot Anterior;Right (Active)  Date First Assessed/Time First Assessed: 03/10/23 0030   Location: Foot  Location Orientation: Anterior;Right  Present on Admission: Yes    Assessments 03/10/2023 12:30 AM 03/12/2023  8:25 AM  Dressing Type Gauze (Comment);Other (Comment) --  Dressing Clean, Dry, Intact Clean, Dry, Intact  Site / Wound Assessment Other (Comment) --     No associated orders.    Discharge Exam:   Vitals:   03/11/23 2358 03/12/23 0349 03/12/23 0735 03/12/23 0825  BP: 114/76 96/70 129/89   Pulse: 60 60  64  Resp: 18 18    Temp: 97.7 F (36.5 C) 97.8 F (36.6 C) 97.7 F (36.5 C)   TempSrc: Oral Oral Oral   SpO2: 96% 99%  97%  Weight:  99.2 kg    Height:        Body mass index is 29.66 kg/m.  General exam: Pleasant, elderly African-American male. Skin: No rashes, lesions or ulcers. HEENT: Atraumatic, normocephalic, no obvious bleeding Lungs: Clear to auscultation bilaterally CVS: Regular rate and rhythm, no murmur GI/Abd soft, nontender, nondistended, bowel sound present CNS: Alert, awake, oriented x 3 Psychiatry: Mood appropriate Extremities: No pedal edema, no calf tenderness  Follow ups:    Follow-up Information      [facility]. Go in 16 day(s).   Specialty: Cardiology Why: Hospital follow up 03/26/23 @ 2 pm PLEASE bring a current medication list to appointment FREE valet parking, Entrance C, off [address] information: [address] [place] [address] [phone]        Renford Dills, MD Follow up.   Specialty: Internal Medicine Contact information: [address] [place] [address] [phone]                 Discharge Instructions:   Discharge Instructions     Call MD for:  difficulty breathing, headache or visual disturbances   Complete by: As directed    Call MD for:  extreme fatigue   Complete by: As directed    Call MD for:  hives   Complete by: As directed    Call MD for:  persistant dizziness or light-headedness   Complete by: As directed    Call MD for:  persistant nausea and vomiting   Complete by: As directed    Call MD for:  severe uncontrolled pain   Complete by: As  directed    Call MD for:  temperature >100.4   Complete by: As directed    Diet - low sodium heart healthy   Complete by: As directed    Discharge wound care:   Complete by: As directed    Increase activity slowly   Complete by: As directed        Discharge Medications:   Allergies as of 03/12/2023       Reactions   Ticagrelor Other (See Comments)   Unknown         Medication List     STOP taking these medications    amLODipine 10 MG tablet Commonly known as: NORVASC   ezetimibe 10 MG tablet Commonly known as: ZETIA   tiZANidine 2 MG tablet Commonly known as: ZANAFLEX       TAKE these medications    acetaminophen 500 MG tablet Commonly known as: TYLENOL Take 1,000 mg by mouth as needed for moderate pain (pain score 4-6) or mild pain (pain score 1-3).   aspirin EC 81 MG tablet Take 81 mg by mouth daily. Swallow whole.   carvedilol 3.125 MG tablet Commonly known as: COREG Take 1 tablet (3.125 mg total) by mouth 2 (two) times daily with a meal.   clopidogrel 75 MG tablet Commonly known as: PLAVIX Take 1 tablet (75 mg total) by mouth daily.   diclofenac Sodium 1 % Gel Commonly known as:  VOLTAREN Apply 1 Application topically as needed (pain).   Entresto 49-51 MG Generic drug: sacubitril-valsartan Take 1 tablet by mouth 2 (two) times daily. Please attend scheduled appointment for additional refills.   ferrous sulfate 325 (65 FE) MG tablet Take 325 mg by mouth daily with breakfast.   furosemide 80 MG tablet Commonly known as: LASIX Take 1 tablet (80 mg total) by mouth daily. Start taking on: March 13, 2023 What changed:  medication strength how much to take how to take this when to take this additional instructions   Jardiance 25 MG Tabs tablet Generic drug: empagliflozin Take 25 mg by mouth daily.   omega-3 acid ethyl esters 1 g capsule Commonly known as: Lovaza Take 1 capsule (1 g total) by mouth daily.   oxyCODONE-acetaminophen 5-325 MG tablet Commonly known as: PERCOCET/ROXICET Take 1 tablet by mouth every 4 (four) hours as needed for severe pain.   Ozempic (0.25 or 0.5 MG/DOSE) 2 MG/1.5ML Sopn Generic drug: Semaglutide(0.25 or 0.5MG /DOS) Inject 0.5 mg into the skin once a week.   rosuvastatin 40 MG tablet Commonly known as: CRESTOR Take 1 tablet (40 mg total) by mouth daily.   spironolactone 25 MG tablet Commonly known as: ALDACTONE Take 0.5 tablets (12.5 mg total) by mouth daily. Start taking on: March 13, 2023   VITAMIN B COMPLEX-C PO Take 1 tablet by mouth daily.   Vitamin D 50 MCG (2000 UT) tablet Take 2,000 Units by mouth daily.               Discharge Care Instructions  (From admission, onward)           Start     Ordered   03/12/23 0000  Discharge wound care:        03/12/23 1028             The results of significant diagnostics from this hospitalization (including imaging, microbiology, ancillary and laboratory) are listed below for reference.    Procedures and Diagnostic Studies:   NM Pulmonary Perfusion Result Date: 03/10/2023 CLINICAL DATA:  Dyspnea, recent surgery  EXAM: NUCLEAR MEDICINE  PERFUSION LUNG SCAN TECHNIQUE: Perfusion images were obtained in multiple projections after intravenous injection of radiopharmaceutical. Ventilation scans intentionally deferred if perfusion scan and chest x-ray adequate for interpretation during COVID 19 epidemic. RADIOPHARMACEUTICALS:  4.2 mCi Tc-53m MAA IV COMPARISON:  Chest x-ray 03/10/2023 FINDINGS: Planar images of the chest are obtained after radiotracer administration. There are no wedge-shaped perfusion defects to suggest pulmonary embolus. IMPRESSION: 1. No evidence of pulmonary embolus. Electronically Signed   By: Sharlet Salina M.D.   On: 03/10/2023 16:17   DG Chest 2 View Result Date: 03/10/2023 CLINICAL DATA:  [id number] Dyspnea [id number] EXAM: CHEST - 2 VIEW COMPARISON:  03/09/2023 FINDINGS: Stable heart size. Prior TAVR. Aortic atherosclerosis. Improved aeration of the lung bases compared to prior. No new airspace consolidation. No pleural effusion or pneumothorax. IMPRESSION: Improved aeration of the lung bases compared to prior. No new airspace consolidation. Electronically Signed   By: Duanne Guess D.O.   On: 03/10/2023 15:00   ECHOCARDIOGRAM COMPLETE Result Date: 03/09/2023    ECHOCARDIOGRAM REPORT   Patient Name:   Robert Horn Date of Exam: 03/09/2023 Medical Rec #:  [id number]     Height:       72.0 in Accession #:    [id number]    Weight:       220.0 lb Date of Birth:  [birth year]     BSA:          2.219 m Patient Age:    89 years      BP:           128/95 mmHg Patient Gender: M             HR:           53 bpm. Exam Location:  Inpatient Procedure: 2D Echo, Cardiac Doppler, Color Doppler and 3D Echo Indications:    R94.31 Abnormal EKG  History:        Patient has prior history of Echocardiogram examinations, most                 recent 03/13/2021. CHF, Abnormal ECG, TIA, Aortic Valve Disease;                 Risk Factors:Hypertension and Dyslipidemia. Severe aortic                 stenosis. TAVR.                 Aortic Valve: 26 mm  Edwards Sapien prosthetic, stented (TAVR)                 valve is present in the aortic position. Procedure Date:                 12/15/2016.  Sonographer:    Sheralyn Boatman RDCS Referring Phys: [id number] [facility] IMPRESSIONS  1. Left ventricular ejection fraction, by estimation, is 25 to 30%. Left ventricular ejection fraction by 3D volume is 31 %. The left ventricle has severely decreased function. The left ventricle demonstrates global hypokinesis. The left ventricular internal cavity size was mildly dilated. Left ventricular diastolic parameters are consistent with Grade III diastolic dysfunction (restrictive). There is the interventricular septum is flattened in systole and diastole, consistent with right ventricular  pressure and volume overload.  2. Right ventricular systolic function is low normal. The right ventricular size is moderately enlarged. Mildly increased right ventricular wall thickness.  3. Left atrial size was moderately dilated.  4. Right atrial size was moderately  dilated.  5. There is no evidence of cardiac tamponade.  6. The mitral valve is degenerative. Mild mitral valve regurgitation.  7. The aortic valve has been repaired/replaced. Aortic valve regurgitation is not visualized. There is a 26 mm Edwards Sapien prosthetic (TAVR) valve present in the aortic position. Procedure Date: 12/15/2016. Aortic valve mean gradient measures 14.3 mmHg. Aortic valve Vmax measures 2.58 m/s. Aortic valve acceleration time measures 67 msec.  8. Aortic dilatation noted. There is dilatation of the ascending aorta, measuring 38 mm. There is dilatation of the ascending aorta, measuring 38 mm. Comparison(s): Prior images reviewed side by side. The left ventricular function is significantly worse. The left ventricular diastolic function is significantly worse. TAVR gradients are lower due to reduction in LV function. FINDINGS  Left Ventricle: Left ventricular ejection fraction, by estimation, is 25 to 30%. Left  ventricular ejection fraction by 3D volume is 31 %. The left ventricle has severely decreased function. The left ventricle demonstrates global hypokinesis. The left ventricular internal cavity size was mildly dilated. There is no left ventricular hypertrophy. The interventricular septum is flattened in systole and diastole, consistent with right ventricular pressure and volume overload. Left ventricular diastolic function could not be evaluated due to atrial fibrillation. Left ventricular diastolic parameters are consistent with Grade III diastolic dysfunction (restrictive). Right Ventricle: The right ventricular size is moderately enlarged. Mildly increased right ventricular wall thickness. Right ventricular systolic function is low normal. Left Atrium: Left atrial size was moderately dilated. Right Atrium: Right atrial size was moderately dilated. Pericardium: Trivial pericardial effusion is present. There is no evidence of cardiac tamponade. Mitral Valve: The mitral valve is degenerative in appearance. Mild mitral valve regurgitation. Tricuspid Valve: The tricuspid valve is grossly normal. Tricuspid valve regurgitation is trivial. No evidence of tricuspid stenosis. Aortic Valve: The aortic valve has been repaired/replaced. Aortic valve regurgitation is not visualized. Aortic valve mean gradient measures 14.3 mmHg. Aortic valve peak gradient measures 26.6 mmHg. Aortic valve area, by VTI measures 1.50 cm. There is a  26 mm Edwards Sapien prosthetic, stented (TAVR) valve present in the aortic position. Procedure Date: 12/15/2016. Pulmonic Valve: The pulmonic valve was normal in structure. Pulmonic valve regurgitation is not visualized. No evidence of pulmonic stenosis. Aorta: Aortic dilatation noted and the aortic root is normal in size and structure. There is dilatation of the ascending aorta, measuring 38 mm. There is dilatation of the ascending aorta, measuring 38 mm. IAS/Shunts: No atrial level shunt detected by  color flow Doppler.  LEFT VENTRICLE PLAX 2D LVIDd:         5.80 cm LVIDs:         5.00 cm LV PW:         1.10 cm         3D Volume EF LV IVS:        0.90 cm         LV 3D EF:    Left LVOT diam:     2.55 cm                      ventricul LV SV:         75                           ar LV SV Index:   34  ejection LVOT Area:     5.11 cm                     fraction                                             by 3D                                             volume is LV Volumes (MOD)                            31 %. LV vol d, MOD    167.0 ml A2C: LV vol d, MOD    152.0 ml      3D Volume EF: A4C:                           3D EF:        31 % LV vol s, MOD    113.0 ml      LV EDV:       163 ml A2C:                           LV ESV:       112 ml LV vol s, MOD    110.0 ml      LV SV:        51 ml A4C: LV SV MOD A2C:   54.0 ml LV SV MOD A4C:   152.0 ml LV SV MOD BP:    50.9 ml RIGHT VENTRICLE            IVC RV S prime:     9.68 cm/s  IVC diam: 1.90 cm TAPSE (M-mode): 1.6 cm LEFT ATRIUM            Index        RIGHT ATRIUM           Index LA diam:      5.40 cm  2.43 cm/m   RA Area:     18.50 cm LA Vol (A2C): 116.0 ml 52.28 ml/m  RA Volume:   48.80 ml  22.00 ml/m LA Vol (A4C): 48.8 ml  21.97 ml/m  AORTIC VALVE AV Area (Vmax):    1.55 cm AV Area (Vmean):   1.48 cm AV Area (VTI):     1.50 cm AV Vmax:           257.67 cm/s AV Vmean:          176.667 cm/s AV VTI:            0.502 m AV Peak Grad:      26.6 mmHg AV Mean Grad:      14.3 mmHg LVOT Vmax:         78.35 cm/s LVOT Vmean:        51.150 cm/s LVOT VTI:          0.148 m LVOT/AV VTI ratio: 0.29  AORTA Ao Root diam: 3.80 cm Ao Asc diam:  3.80 cm MITRAL VALVE  TRICUSPID VALVE MV Area (PHT): 6.63 cm     TR Peak grad:   26.4 mmHg MV Decel Time: 115 msec     TR Vmax:        257.00 cm/s MV E velocity: 114.25 cm/s                             SHUNTS                             Systemic VTI:  0.15 m                             Systemic  Diam: 2.55 cm Thurmon Fair MD Electronically signed by Thurmon Fair MD Signature Date/Time: 03/09/2023/4:23:09 PM    Final    DG Chest 2 View Result Date: 03/09/2023 CLINICAL DATA:  89 year old male with history of shortness of breath. EXAM: CHEST - 2 VIEW COMPARISON:  Chest x-ray 02/28/2023. FINDINGS: Lung volumes are low. There are bibasilar opacities (left-greater-than-right), which may reflect areas of atelectasis and/or consolidation. Small left pleural effusion. No definite right pleural effusion. No pneumothorax. No evidence of pulmonary edema. Heart size is normal. Upper mediastinal contours are within normal limits. Atherosclerotic calcifications are noted in the thoracic aorta. Status post TAVR. IMPRESSION: 1. Low lung volumes with bibasilar areas of atelectasis and/or consolidation with small left pleural effusion. 2. Aortic atherosclerosis. Electronically Signed   By: Trudie Reed M.D.   On: 03/09/2023 06:04     Labs:   Basic Metabolic Panel: Recent Labs  Lab 03/09/23 0526 03/10/23 0242 03/11/23 0223 03/11/23 0855 03/12/23 0229  NA 140 140 140 131*  135 138  K 3.6 3.6 3.9 3.5  3.4* 3.6  CL 106 108 106  --  104  CO2 24 24 26   --  23  GLUCOSE 125* 111* 111*  --  111*  BUN 22 24* 23  --  30*  CREATININE 1.61* 1.61* 1.66*  --  1.77*  CALCIUM 9.0 8.8* 8.9  --  8.7*  MG  --  2.4  --   --   --   PHOS  --  3.5  --   --   --    GFR Estimated Creatinine Clearance: 35.8 mL/min (A) (by C-G formula based on SCr of 1.77 mg/dL (H)). Liver Function Tests: No results for input(s): "AST", "ALT", "ALKPHOS", "BILITOT", "PROT", "ALBUMIN" in the last 168 hours. No results for input(s): "LIPASE", "AMYLASE" in the last 168 hours. No results for input(s): "AMMONIA" in the last 168 hours. Coagulation profile No results for input(s): "INR", "PROTIME" in the last 168 hours.  CBC: Recent Labs  Lab 03/09/23 0526 03/10/23 0242 03/11/23 0223 03/11/23 0855 03/12/23 0229  WBC 7.5  8.7 9.0  --  7.2  NEUTROABS  --   --  5.5  --   --   HGB 12.8* 12.3* 13.2 12.9*  12.9* 13.1  HCT 40.6 38.0* 41.5 38.0*  38.0* 40.6  MCV 93.1 90.3 91.0  --  90.0  PLT 177 PLATELET CLUMPS NOTED ON SMEAR, UNABLE TO ESTIMATE PLATELET CLUMPS NOTED ON SMEAR, COUNT APPEARS ADEQUATE  --  PLATELET CLUMPS NOTED ON SMEAR, UNABLE TO ESTIMATE   Cardiac Enzymes: No results for input(s): "CKTOTAL", "CKMB", "CKMBINDEX", "TROPONINI" in the last 168 hours. BNP: Invalid input(s): "POCBNP" CBG: Recent Labs  Lab 03/11/23 0509 03/11/23  1118 03/11/23 1616 03/11/23 2146 03/12/23 0558  GLUCAP 114* 137* 104* 106* 126*   D-Dimer No results for input(s): "DDIMER" in the last 72 hours. Hgb A1c Recent Labs    03/10/23 0242  HGBA1C 6.4*   Lipid Profile No results for input(s): "CHOL", "HDL", "LDLCALC", "TRIG", "CHOLHDL", "LDLDIRECT" in the last 72 hours. Thyroid function studies No results for input(s): "TSH", "T4TOTAL", "T3FREE", "THYROIDAB" in the last 72 hours.  Invalid input(s): "FREET3" Anemia work up No results for input(s): "VITAMINB12", "FOLATE", "FERRITIN", "TIBC", "IRON", "RETICCTPCT" in the last 72 hours. Microbiology No results found for this or any previous visit (from the past 240 hours).  Time coordinating discharge: 45 minutes  Signed: Leva Baine  [facility] 03/12/2023, 10:29 AM

## 2023-03-12 NOTE — Progress Notes (Signed)
Rounding Note    Patient Name: Robert Horn Date of Encounter: 03/12/2023  [facility] Cardiologist: Chilton Si, MD   Subjective   He is looking forward to going home  Inpatient Medications    Scheduled Meds:  aspirin EC  81 mg Oral Daily   carvedilol  3.125 mg Oral BID WC   clopidogrel  75 mg Oral Daily   empagliflozin  25 mg Oral Daily   ezetimibe  10 mg Oral Daily   ferrous sulfate  325 mg Oral Q breakfast   furosemide  80 mg Oral Daily   insulin aspart  0-5 Units Subcutaneous QHS   insulin aspart  0-9 Units Subcutaneous TID WC   rosuvastatin  40 mg Oral Daily   sacubitril-valsartan  1 tablet Oral BID   senna  1 tablet Oral BID   sodium chloride flush  3 mL Intravenous Q12H   spironolactone  12.5 mg Oral Daily   Continuous Infusions:  sodium chloride     PRN Meds: sodium chloride, acetaminophen **OR** acetaminophen, albuterol, bisacodyl, hydrALAZINE, polyethylene glycol, sodium chloride flush   Vital Signs    Vitals:   03/11/23 1946 03/11/23 2358 03/12/23 0349 03/12/23 0735  BP:  114/76 96/70 129/89  Pulse:  60 60   Resp:  18 18   Temp: 97.7 F (36.5 C) 97.7 F (36.5 C) 97.8 F (36.6 C) 97.7 F (36.5 C)  TempSrc: Oral Oral Oral Oral  SpO2:  96% 99%   Weight:   99.2 kg   Height:        Intake/Output Summary (Last 24 hours) at 03/12/2023 0940 Last data filed at 03/12/2023 0353 Gross per 24 hour  Intake --  Output 1900 ml  Net -1900 ml      03/12/2023    3:49 AM 03/11/2023    4:38 AM 03/10/2023   12:34 AM  Last 3 Weights  Weight (lbs) 218 lb 11.1 oz 217 lb 6 oz 218 lb 4.1 oz  Weight (kg) 99.2 kg 98.6 kg 99 kg      Tele Sinus rhythm with infrequent PVCs  ECG    No new - Personally Reviewed  Physical Exam   Vitals:   03/12/23 0349 03/12/23 0735  BP: 96/70 129/89  Pulse: 60   Resp: 18   Temp: 97.8 F (36.6 C) 97.7 F (36.5 C)  SpO2: 99%     GEN: No acute distress.   Cardiac: RRR, no murmurs, rubs, or  gallops.  Respiratory: nl wob, good air movement GI: Soft, nontender, non-distended  MS: No edema; No deformity. Neuro:  Nonfocal  Psych: Normal affect   Labs    High Sensitivity Troponin:   Recent Labs  Lab 02/28/23 0951 02/28/23 1223 03/09/23 0526 03/09/23 0924 03/10/23 0242  TROPONINIHS 88* 81* 55* 118* 1,325*     Chemistry Recent Labs  Lab 03/10/23 0242 03/11/23 0223 03/11/23 0855 03/12/23 0229  NA 140 140 131*  135 138  K 3.6 3.9 3.5  3.4* 3.6  CL 108 106  --  104  CO2 24 26  --  23  GLUCOSE 111* 111*  --  111*  BUN 24* 23  --  30*  CREATININE 1.61* 1.66*  --  1.77*  CALCIUM 8.8* 8.9  --  8.7*  MG 2.4  --   --   --   GFRNONAA 41* 40*  --  37*  ANIONGAP 8 8  --  11    Lipids No results for input(s): "CHOL", "TRIG", "  HDL", "LABVLDL", "LDLCALC", "CHOLHDL" in the last 168 hours.  Hematology Recent Labs  Lab 03/10/23 0242 03/11/23 0223 03/11/23 0855 03/12/23 0229  WBC 8.7 9.0  --  7.2  RBC 4.21* 4.56  --  4.51  HGB 12.3* 13.2 12.9*  12.9* 13.1  HCT 38.0* 41.5 38.0*  38.0* 40.6  MCV 90.3 91.0  --  90.0  MCH 29.2 28.9  --  29.0  MCHC 32.4 31.8  --  32.3  RDW 13.7 13.7  --  13.9  PLT PLATELET CLUMPS NOTED ON SMEAR, UNABLE TO ESTIMATE PLATELET CLUMPS NOTED ON SMEAR, COUNT APPEARS ADEQUATE  --  PLATELET CLUMPS NOTED ON SMEAR, UNABLE TO ESTIMATE   Thyroid No results for input(s): "TSH", "FREET4" in the last 168 hours.  BNP Recent Labs  Lab 03/09/23 0526  BNP 612.7*    DDimer No results for input(s): "DDIMER" in the last 168 hours.   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 Mild elevation right heart pressures PCWP 13 mmHg Cardiac output 4.3 L/min CI 1.95 Recommendations: Continue medical management of non-ischemic cardiomyopathy. He has been adequately diuresed.   NM Pulmonary Perfusion Result Date: 03/10/2023 CLINICAL DATA:  Dyspnea,  recent surgery EXAM: NUCLEAR MEDICINE PERFUSION LUNG SCAN TECHNIQUE: Perfusion images were obtained in multiple projections after intravenous injection of radiopharmaceutical. Ventilation scans intentionally deferred if perfusion scan and chest x-ray adequate for interpretation during COVID 19 epidemic. RADIOPHARMACEUTICALS:  4.2 mCi Tc-1m MAA IV COMPARISON:  Chest x-ray 03/10/2023 FINDINGS: Planar images of the chest are obtained after radiotracer administration. There are no wedge-shaped perfusion defects to suggest pulmonary embolus. IMPRESSION: 1. No evidence of pulmonary embolus. Electronically Signed   By: Sharlet Salina M.D.   On: 03/10/2023 16:17   DG Chest 2 View Result Date: 03/10/2023 CLINICAL DATA:  [id number] Dyspnea [id number] EXAM: CHEST - 2 VIEW COMPARISON:  03/09/2023 FINDINGS: Stable heart size. Prior TAVR. Aortic atherosclerosis. Improved aeration of the lung bases compared to prior. No new airspace consolidation. No pleural effusion or pneumothorax. IMPRESSION: Improved aeration of the lung bases compared to prior. No new airspace consolidation. Electronically Signed   By: Duanne Guess D.O.   On: 03/10/2023 15:00    Cardiac Studies  TTE 03/09/2023 1. Left ventricular ejection fraction, by estimation, is 25 to 30%. Left  ventricular ejection fraction by 3D volume is 31 %. The left ventricle has  severely decreased function. The left ventricle demonstrates global  hypokinesis. The left ventricular  internal cavity size was mildly dilated. Left ventricular diastolic  parameters are consistent with Grade III diastolic dysfunction  (restrictive). There is the interventricular septum is flattened in  systole and diastole, consistent with right ventricular   pressure and volume overload.   2. Right ventricular systolic function is low normal. The right  ventricular size is moderately enlarged. Mildly increased right  ventricular wall thickness.   3. Left atrial size was moderately  dilated.   4. Right atrial size was moderately dilated.   5. There is no evidence of cardiac tamponade.   6. The mitral valve is degenerative. Mild mitral valve regurgitation.   7. The aortic valve has been repaired/replaced. Aortic valve  regurgitation is not visualized. There is a 26 mm Edwards Sapien  prosthetic (TAVR) valve present in the aortic position. Procedure Date:  12/15/2016. Aortic valve mean gradient measures 14.3  mmHg. Aortic valve Vmax measures 2.58 m/s. Aortic valve acceleration time  measures 67 msec.   8. Aortic dilatation noted. There is dilatation of the ascending aorta,  measuring 38 mm. There is dilatation of the ascending aorta, measuring 38  mm.   Comparison(s): Prior images reviewed side by side. The left ventricular  function is significantly worse. The left ventricular diastolic function  is significantly worse. TAVR gradients are lower due to reduction in LV  function.    Patient Profile     Demoni Haenel is a 89 y.o. male with a hx of nonischemic cardiomyopathy, chronic heart failure with mildly reduced EF, TAVR 2018, PAD status post partial right foot amputations, coronary calcifications with patent coronary arteries on catheterization 2017, TIA 02/2021, CKD, diabetes, colon cancer status post colectomy hypertension, hyperlipidemia, who is being seen 03/10/2023 for the evaluation of shortness of breath at the request of Dr. Pola Corn.   Assessment & Plan    Decompensated cardiomyopathy Troponin elevation After procedure on his foot he developed acute shortness of breath and volume overload.  His troponin also had a delta.  He denied chest pain and had no concerning acute ischemic changes on his EKG. -His left heart cath showed no obstructive CAD.  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  Will plan for 1 more day of IV diuresis and then transition him to oral diuretics tomorrow.  He was on  Lasix 60 mg daily at home, will increase to 80 mg daily on discharge today. -continue carvedilol 3.125 mg BID  -continue spironolactone 12.5 mg daily -Continue Entresto 49/51 mg twice daily  Stable chronic conditions TAVR 2018 Mean gradient 14.3.  V-max 2.58.   PAD Status post multiple R partial foot amputation, with recent debridement/revision 02/24/2023. DAPT as above and statin.    CKD Renal function bumped with contrast.  He can have follow-up labs as an outpatient.  Baseline around 1.6   Diabetes A1c 6.4%.   From a cardiology perspective can be discharge home today.  Time Spent Directly with Patient:  I have spent a total of 35 minutes with the patient reviewing hospital notes, telemetry, EKGs, labs and examining the patient as well as establishing an assessment and plan that was discussed personally with the patient.     For questions or updates, please contact [facility] Please consult [email or web address] for contact info under        Signed, Maisie Fus, MD  03/12/2023, 9:40 AM

## 2023-03-12 NOTE — TOC Transition Note (Signed)
Transition of Care [facility]) - Discharge Note   Patient Details  Name: Robert Horn MRN: [id number] Date of Birth: 01-07-1934  [facility] CM/SW Contact:  Leone Haven, RN Phone Number: 03/12/2023, 10:56 AM   Clinical Narrative:    For dc today, he has transportation , TOC to fill meds.     Barriers to Discharge: Continued Medical Work up   Patient Goals and CMS Choice Patient states their goals for this hospitalization and ongoing recovery are:: get better   Choice offered to / list presented to : NA      Discharge Placement                       Discharge Plan and Services Additional resources added to the After Visit Summary for   In-house Referral: NA Discharge Planning Services: CM Consult Post Acute Care Choice: NA          DME Arranged: N/A DME Agency: NA       HH Arranged: NA          Social Drivers of Health (SDOH) Interventions SDOH Screenings   Food Insecurity: No Food Insecurity (03/09/2023)  Housing: Low Risk  (03/11/2023)  Transportation Needs: No Transportation Needs (03/09/2023)  Utilities: Not At Risk (03/09/2023)  Alcohol Screen: Low Risk  (03/10/2023)  Depression (PHQ2-9): Low Risk  (05/13/2021)  Financial Resource Strain: Low Risk  (03/10/2023)  Tobacco Use: Medium Risk (03/09/2023)     Readmission Risk Interventions     No data to display

## 2023-03-12 NOTE — Progress Notes (Signed)
   Heart Failure Stewardship Pharmacist Progress Note   PCP: Renford Dills, MD PCP-Cardiologist: Chilton Si, MD    HPI:  89 yo M with PMH of CHF, CAD, aortic stenosis s/p TAVR, T2DM, HTN, HLD, PAD, CKD, colon cancer s/p colectomy, and recent partial foot amputation.   Presented to the ED on 12/10 with shortness of breath, LE edema, and weakness. He was instructed to hold his lasix a few days around surgery. CXR with bibasilar atelectasis and small left pleural effusion. ECHO 12/10 with LVEF 25-30% (was 45-50% in 2022), G3DD, RV low normal, mild MR.   Taken for [facility] on 12/12 and found to have no angiographic evidence of CAD, wedge 13, CO 4.3 and CI 1.95.   His wife reports that he was off lasix several days around the surgery because of scheduling issues. Otherwise, his fluid has been well managed for some time. Patient more interactive today. Reviewed discharge medication list with him and his wife.   Current HF Medications: Diuretic: furosemide 80 mg daily Beta Blocker: carvedilol 3.125 mg BID ACE/ARB/ARNI: Entresto 49/51 mg BID MRA: spironolactone 12.5 mg daily SGLT2i: Jardiance 25 mg daily  Prior to admission HF Medications: Diuretic: furosemide 40 mg daily Beta blocker: carvedilol 3.125 mg BID ACE/ARB/ARNI: Entresto 49/51 mg BID SGLT2i: Jardiance 25 mg daily  Pertinent Lab Values: Serum creatinine 1.77, BUN 30, Potassium 3.6, Sodium 138, BNP 612.7, Magnesium 2.4, A1c 6.4   Vital Signs: Weight: 218 lbs (admission weight: 220 lbs) Blood pressure: 100-120/80s  Heart rate: 60-70s  I/O: net -1.9L yesterday; net -5.6L since admission  Medication Assistance / Insurance Benefits Check: Does the patient have prescription insurance?  Yes Type of insurance plan: [employer] Medicare  Outpatient Pharmacy:  Prior to admission outpatient pharmacy: [facility] Is the patient willing to use [facility] TOC pharmacy at discharge? Yes Is the patient willing to transition their outpatient pharmacy to  utilize a [facility] outpatient pharmacy?   No    Assessment: 1. Acute on chronic systolic CHF (LVEF 25-30%), due to NICM. NYHA class II symptoms. - Creatinine bump 1.66>1.77. Furosemide IV transitioned to PO 80 mg daily today. Strict I/Os and daily weights. Keep K>4 and Mg>2. - Continue carvedilol 3.125 mg BID - Continue Entresto 49/51 mg BID - Continue spironolactone 12.5 mg daily  - Continue Jardiance 25 mg daily   Plan: 1) Medication changes recommended at this time: - Agree with changes  2) Patient assistance: - None pending  3)  Education  - Patient has been educated on current HF medications and potential additions to HF medication regimen - Patient verbalizes understanding that over the next few months, these medication doses may change and more medications may be added to optimize HF regimen - Patient has been educated on basic disease state pathophysiology and goals of therapy   Sharen Hones, PharmD, BCPS Heart Failure Stewardship Pharmacist Phone [phone]

## 2023-03-12 NOTE — Discharge Instructions (Signed)
General discharge instructions: Follow with Primary MD Renford Dills, MD in 7 days  Please request your PCP  to go over your hospital tests, procedures, radiology results at the follow up. Please get your medicines reviewed and adjusted.  Your PCP may decide to repeat certain labs or tests as needed. Do not drive, operate heavy machinery, perform activities at heights, swimming or participation in water activities or provide baby sitting services if your were admitted for syncope or siezures until you have seen by Primary MD or a Neurologist and advised to do so again. [place] Controlled Substance Reporting System database was reviewed. Do not drive, operate heavy machinery, perform activities at heights, swim, participate in water activities or provide baby-sitting services while on medications for pain, sleep and mood until your outpatient physician has reevaluated you and advised to do so again.  You are strongly recommended to comply with the dose, frequency and duration of prescribed medications. Activity: As tolerated with Full fall precautions use walker/cane & assistance as needed Avoid using any recreational substances like cigarette, tobacco, alcohol, or non-prescribed drug. If you experience worsening of your admission symptoms, develop shortness of breath, life threatening emergency, suicidal or homicidal thoughts you must seek medical attention immediately by calling 911 or calling your MD immediately  if symptoms less severe. You must read complete instructions/literature along with all the possible adverse reactions/side effects for all the medicines you take and that have been prescribed to you. Take any new medicine only after you have completely understood and accepted all the possible adverse reactions/side effects.  Wear Seat belts while driving. You were cared for by a hospitalist during your hospital stay. If you have any questions about your discharge medications or the care  you received while you were in the hospital after you are discharged, you can call the unit and ask to speak with the hospitalist or the covering physician. Once you are discharged, your primary care physician will handle any further medical issues. Please note that NO REFILLS for any discharge medications will be authorized once you are discharged, as it is imperative that you return to your primary care physician (or establish a relationship with a primary care physician if you do not have one). Discharge instructions for CHF Check weight daily -preferably same time every day. Restrict fluid intake to 1200 ml daily Restrict salt intake to less than 2 g daily. Call MD if you have one of the following symptoms 1) 3 pound weight gain in 24 hours or 5 pounds in 1 week  2) swelling in the hands, feet or stomach  3) progressive shortness of breath 4) if you have to sleep on extra pillows at night in order to breathe

## 2023-03-22 DIAGNOSIS — L97412 Non-pressure chronic ulcer of right heel and midfoot with fat layer exposed: Secondary | ICD-10-CM | POA: Diagnosis not present

## 2023-03-22 DIAGNOSIS — L97512 Non-pressure chronic ulcer of other part of right foot with fat layer exposed: Secondary | ICD-10-CM | POA: Diagnosis not present

## 2023-03-22 DIAGNOSIS — N183 Chronic kidney disease, stage 3 unspecified: Secondary | ICD-10-CM | POA: Diagnosis not present

## 2023-03-22 DIAGNOSIS — E11621 Type 2 diabetes mellitus with foot ulcer: Secondary | ICD-10-CM | POA: Diagnosis not present

## 2023-03-22 DIAGNOSIS — I739 Peripheral vascular disease, unspecified: Secondary | ICD-10-CM | POA: Diagnosis not present

## 2023-03-26 ENCOUNTER — Encounter (HOSPITAL_COMMUNITY): Payer: Self-pay

## 2023-03-26 ENCOUNTER — Ambulatory Visit (HOSPITAL_COMMUNITY)
Admit: 2023-03-26 | Discharge: 2023-03-26 | Disposition: A | Discharge status: Home / Self Care | Payer: Medicare HMO | Source: Ambulatory Visit | Attending: Physician Assistant | Admitting: Physician Assistant

## 2023-03-26 VITALS — BP 122/80 | HR 53 | Ht 72.0 in | Wt 214.4 lb

## 2023-03-26 DIAGNOSIS — Z952 Presence of prosthetic heart valve: Secondary | ICD-10-CM | POA: Diagnosis not present

## 2023-03-26 DIAGNOSIS — I5020 Unspecified systolic (congestive) heart failure: Secondary | ICD-10-CM | POA: Diagnosis not present

## 2023-03-26 DIAGNOSIS — E1151 Type 2 diabetes mellitus with diabetic peripheral angiopathy without gangrene: Secondary | ICD-10-CM | POA: Insufficient documentation

## 2023-03-26 DIAGNOSIS — E1122 Type 2 diabetes mellitus with diabetic chronic kidney disease: Secondary | ICD-10-CM | POA: Insufficient documentation

## 2023-03-26 DIAGNOSIS — N1832 Chronic kidney disease, stage 3b: Secondary | ICD-10-CM | POA: Insufficient documentation

## 2023-03-26 DIAGNOSIS — I428 Other cardiomyopathies: Secondary | ICD-10-CM | POA: Diagnosis not present

## 2023-03-26 DIAGNOSIS — I130 Hypertensive heart and chronic kidney disease with heart failure and stage 1 through stage 4 chronic kidney disease, or unspecified chronic kidney disease: Secondary | ICD-10-CM | POA: Diagnosis not present

## 2023-03-26 DIAGNOSIS — R9431 Abnormal electrocardiogram [ECG] [EKG]: Secondary | ICD-10-CM | POA: Insufficient documentation

## 2023-03-26 DIAGNOSIS — Z8673 Personal history of transient ischemic attack (TIA), and cerebral infarction without residual deficits: Secondary | ICD-10-CM | POA: Insufficient documentation

## 2023-03-26 DIAGNOSIS — Z85038 Personal history of other malignant neoplasm of large intestine: Secondary | ICD-10-CM | POA: Insufficient documentation

## 2023-03-26 DIAGNOSIS — I350 Nonrheumatic aortic (valve) stenosis: Secondary | ICD-10-CM

## 2023-03-26 DIAGNOSIS — Z7984 Long term (current) use of oral hypoglycemic drugs: Secondary | ICD-10-CM | POA: Diagnosis not present

## 2023-03-26 DIAGNOSIS — I5022 Chronic systolic (congestive) heart failure: Secondary | ICD-10-CM | POA: Diagnosis not present

## 2023-03-26 LAB — BASIC METABOLIC PANEL
Anion gap: 8 (ref 5–15)
BUN: 31 mg/dL — ABNORMAL HIGH (ref 8–23)
CO2: 23 mmol/L (ref 22–32)
Calcium: 9.2 mg/dL (ref 8.9–10.3)
Chloride: 105 mmol/L (ref 98–111)
Creatinine, Ser: 1.85 mg/dL — ABNORMAL HIGH (ref 0.61–1.24)
GFR, Estimated: 35 mL/min — ABNORMAL LOW (ref >60)
Glucose, Bld: 134 mg/dL — ABNORMAL HIGH (ref 70–99)
Potassium: 4 mmol/L (ref 3.5–5.1)
Sodium: 136 mmol/L (ref 135–145)

## 2023-03-26 LAB — BRAIN NATRIURETIC PEPTIDE: B Natriuretic Peptide: 229.1 pg/mL — ABNORMAL HIGH (ref 0.0–100.0)

## 2023-03-26 NOTE — Progress Notes (Signed)
HEART & VASCULAR TRANSITION OF CARE CONSULT NOTE     Referring Physician: Dr. Pola Corn Primary Care: Dr. Nehemiah Settle Primary Cardiologist: Dr. Duke Salvia  HPI: Referred to clinic by Dr. Pola Corn with [facility] for heart failure consultation. 89 y.o. male with history of nonischemic cardiomyopathy, HFrEF, aortic valve stenosis s/p TAVR in 2018, PAD s/p right SFA PTA/stent followed by right toe amputations and later TMA, Hx TIA in 2022, CKD, DM, colon cancer s/p colectomy, HTN.   EF was as low as 25-30% in 2018. He had no significant CAD on cath and underwent TAVR in 2018. Echo 1 month post TAVR with EF 45-50%. EF stable 45-50% in 12/22.   He underwent right stump debridement and partial bone resection 02/24/23. After surgery he developed worsening dyspnea. His diuretics had been on hold d/t rescheduling surgery several times.   He was seen in ED 12/1 and given IV lasix. Returned to ED 12/10. Troponin elevated up to 1325. There was concern for ACS versus PE. Echo with EF 25-30%, global HK, grade III DD, interventricular septum flattening c/w RV pressure and volume overload, RV low normal, mod BAE, mean gradient 14 mmHg across TAVR. Cardiology consulted. LHC/RHC with no obstructive CAD. 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 RA mean 12, PA mean 28, PCWP mean 13, Fick CO/CI 4.3/1.95. He was diuresed with IV lasix and GDMT titrated.   He is here today for hospital CHF follow-up. His wife is present and assists with the history. Dyspnea significantly improved. No lower extremity edema, orthopnea or PND. His wife helps him restrict fluid and sodium intake. He has been non-weight bearing while his right foot wound recovers but states that he otherwise walks independently. He had one episode of lightheadedness today. Gets lightheaded very infrequently. His wife is concerned he is not getting enough calories. Hasn't been eating as much since starting ozempic. Weight has been stable between  210-212 lb since discharge.   Past Medical History:  Diagnosis Date   Anemia    Anxiety    situational- surgery   Asthma    as a child   Cancer (HCC) 2019   colon- colectomy    CHF (congestive heart failure) (HCC)    Chronic kidney disease    followed by Dr. Nehemiah Settle   Coronary artery disease    Diabetes mellitus without complication (HCC)    Type II   Dyslipidemia 10/27/2015   Dyspnea    w/ exertion    Elevated PSA 10/27/2015   Erectile dysfunction 10/27/2015   Heart murmur    Hypertension 10/27/2015   Hypogonadism male 10/27/2015   Neuropathy    Obesity 10/27/2015   Peripheral vascular disease (HCC)    Pneumonia 10/27/2015   pt states was 1982   Rotator cuff tear 10/27/2015    Current Outpatient Medications  Medication Sig Dispense Refill   acetaminophen (TYLENOL) 500 MG tablet Take 1,000 mg by mouth as needed for moderate pain (pain score 4-6) or mild pain (pain score 1-3).     aspirin 81 MG EC tablet Take 81 mg by mouth daily. Swallow whole.     carvedilol (COREG) 3.125 MG tablet Take 1 tablet (3.125 mg total) by mouth 2 (two) times daily with a meal. 60 tablet 11   Cholecalciferol (VITAMIN D) 50 MCG (2000 UT) tablet Take 2,000 Units by mouth daily.     clopidogrel (PLAVIX) 75 MG tablet Take 1 tablet (75 mg total) by mouth daily. 30 tablet 11  diclofenac Sodium (VOLTAREN) 1 % GEL Apply 1 Application topically as needed (pain).     ferrous sulfate 325 (65 FE) MG tablet Take 325 mg by mouth daily with breakfast.     furosemide (LASIX) 80 MG tablet Take 1 tablet (80 mg total) by mouth daily. 90 tablet 0   JARDIANCE 25 MG TABS tablet Take 25 mg by mouth daily.     omega-3 acid ethyl esters (LOVAZA) 1 g capsule Take 1 capsule (1 g total) by mouth daily. 90 capsule 3   oxyCODONE-acetaminophen (PERCOCET/ROXICET) 5-325 MG tablet Take 1 tablet by mouth every 4 (four) hours as needed for severe pain. 30 tablet 0   rosuvastatin (CRESTOR) 40 MG tablet Take 1 tablet (40 mg total)  by mouth daily. 30 tablet 5   sacubitril-valsartan (ENTRESTO) 49-51 MG Take 1 tablet by mouth 2 (two) times daily. Please attend scheduled appointment for additional refills. 60 tablet 11   Semaglutide,0.25 or 0.5MG /DOS, (OZEMPIC, 0.25 OR 0.5 MG/DOSE,) 2 MG/1.5ML SOPN Inject 0.5 mg into the skin once a week.     spironolactone (ALDACTONE) 25 MG tablet Take 0.5 tablets (12.5 mg total) by mouth daily. 45 tablet 0   VITAMIN B COMPLEX-C PO Take 1 tablet by mouth daily.     No current facility-administered medications for this encounter.    Allergies  Allergen Reactions   Ticagrelor Other (See Comments)    Unknown       Social History   Socioeconomic History   Marital status: Married    Spouse name: Alice   Number of children: 2   Years of education: Not on file   Highest education level: Master's degree (e.g., MA, MS, MEng, MEd, MSW, MBA)  Occupational History   Occupation: Part-time  Tobacco Use   Smoking status: Former    Types: Cigarettes    Passive exposure: Never   Smokeless tobacco: Never  Vaping Use   Vaping status: Never Used  Substance and Sexual Activity   Alcohol use: Yes    Comment: rarely   Drug use: No   Sexual activity: Not on file  Other Topics Concern   Not on file  Social History Narrative   Not on file   Social Drivers of Health   Financial Resource Strain: Low Risk  (03/10/2023)   Overall Financial Resource Strain (CARDIA)    Difficulty of Paying Living Expenses: Not hard at all  Food Insecurity: No Food Insecurity (03/09/2023)   Hunger Vital Sign    Worried About Running Out of Food in the Last Year: Never true    Ran Out of Food in the Last Year: Never true  Transportation Needs: No Transportation Needs (03/09/2023)   PRAPARE - Administrator, Civil Service (Medical): No    Lack of Transportation (Non-Medical): No  Physical Activity: Not on file  Stress: Not on file  Social Connections: Not on file  Intimate Partner Violence: Not  At Risk (03/09/2023)   Humiliation, Afraid, Rape, and Kick questionnaire    Fear of Current or Ex-Partner: No    Emotionally Abused: No    Physically Abused: No    Sexually Abused: No      Family History  Problem Relation Age of Onset   Diabetes Mother    Heart disease Mother    Pulmonary embolism Father     Vitals:   03/26/23 1356  BP: 122/80  Pulse: (!) 53  SpO2: 95%  Weight: 97.3 kg (214 lb 6.4 oz)  Height: 6' (  1.829 m)    PHYSICAL EXAM: General:  Well appearing elderly male.  HEENT: HOH Neck: supple. no JVD.  Cor: PMI nondisplaced. Regular rate & rhythm. No rubs, gallops or murmurs. Lungs: clear Abdomen: soft, nontender, nondistended.  Extremities: no cyanosis, clubbing, rash, edema, R foot in a boot Neuro: alert & oriented x 3. moves all 4 extremities w/o difficulty. Affect pleasant.  ECG: sinus rhythm with 1st degree AVB, 70 bpm, IRBBB, PVCs   ASSESSMENT & PLAN: HF with previously recovered EF>>now reduced/NICM -EF was as low as 25-30% in 2018. No significant CAD on cath and underwent TAVR in 2018. Echo 1 month post TAVR with EF 45-50%.  -EF stable 45-50% in 12/22.  -Echo 12/24: EF 25-30%, global HK, grade III DD, interventricular septum flattening c/w RV pressure and volume overload, RV low normal, mod BAE, mean gradient 14 mmHg across TAVR -LHC/RHC 12/24: no obstructive CAD. 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 RA mean 12, PA mean 28, PCWP mean 13, Fick CO/CI 4.3/1.95 -Etiology not certain. Some PVCs and NSVT on tele but did not appear that frequent on review of available rhythm strips.  -NYHA status difficult. Limited mobility d/t non-weight bearing. Volume looks good on exam. Continue lasix 80 daily. Check labs today. ? If today's episode of dizziness is d/t low volume status. May need to cut back lasix. -Continue Coreg 3.125 mg BID -Continue Jardiance 25 mg daily (DM dose) -Continue Entresto 49/51 mg BID -Continue spiro 12.5 mg  daily -Labs today -Hesitant to titrate GDMT any further with his advanced age d/t concern for inducing hypotension  2. Severe AS -S/p TAVR in 2018 -Stable TAVR valve on echo 12/24. Lower gradients in setting of reduced LV function  3. CKD IIIb -Baseline Scr seems to be 1.5-1.8 -Continue Jardiance -Labs today  Referred to HFSW (PCP, Medications, Transportation, ETOH Abuse, Drug Abuse, Insurance, Financial ): No Refer to Pharmacy: No Refer to Home Health: YNo Refer to [facility]: no  Refer to General Cardiology: No, already has appointment  Follow up  Keep appointment with Cardiology as scheduled in 01/25

## 2023-03-26 NOTE — Patient Instructions (Signed)
Medication Changes:  No Changes In Medications at this time.   Lab Work:  Labs done today, your results will be available in MyChart, we will contact you for abnormal readings.  Follow-Up in: WITH GENERAL CARDIOLOGY AS SCHEDULED   AND WITH [place] AS NEEDED   At the [facility], you and your health needs are our priority. We have a designated team specialized in the treatment of Heart Failure. This Care Team includes your primary Heart Failure Specialized Cardiologist (physician), Advanced Practice Providers (APPs- Physician Assistants and Nurse Practitioners), and Pharmacist who all work together to provide you with the care you need, when you need it.   You may see any of the following providers on your designated Care Team at your next follow up:  Dr. Arvilla Meres Dr. Marca Ancona Dr. Dorthula Nettles Dr. Theresia Bough Tonye Becket, NP Robbie Lis, [place] [facility] [place], [place] Brynda Peon, NP [place] Lee, NP Karle Plumber, PharmD   Please be sure to bring in all your medications bottles to every appointment.   Need to Contact us:  If you have any questions or concerns before your next appointment please send [place] a message through [place] or call our office at [phone].    TO LEAVE A MESSAGE FOR THE NURSE SELECT OPTION 2, PLEASE LEAVE A MESSAGE INCLUDING: YOUR NAME DATE OF BIRTH CALL BACK NUMBER REASON FOR CALL**this is important as we prioritize the call backs  YOU WILL RECEIVE A CALL BACK THE SAME DAY AS LONG AS YOU CALL BEFORE 4:00 PM

## 2023-03-29 ENCOUNTER — Telehealth (HOSPITAL_COMMUNITY): Payer: Self-pay

## 2023-03-29 DIAGNOSIS — I5020 Unspecified systolic (congestive) heart failure: Secondary | ICD-10-CM

## 2023-03-29 MED ORDER — FUROSEMIDE 80 MG PO TABS: ORAL_TABLET | ORAL | 5 refills | Status: DC

## 2023-03-29 NOTE — Telephone Encounter (Signed)
-----   Message from [facility] [address], [place] sent at 03/26/2023  5:34 PM EST ----- BNP improved. Kidney function slightly worse. Would cut back lasix to 80 mg alternating with 40 mg every other day.

## 2023-03-29 NOTE — Telephone Encounter (Signed)
Patient's lasix medication has been changed and updated in his chart. In addition, per pt's wife's request a message has also ben sent to [place] how long the dose change is recommended? Pt's wife Fulton Mole is aware, agreeable, and verbalized understanding.

## 2023-03-30 ENCOUNTER — Ambulatory Visit: Payer: Medicare HMO | Admitting: Physician Assistant

## 2023-03-30 ENCOUNTER — Ambulatory Visit (INDEPENDENT_AMBULATORY_CARE_PROVIDER_SITE_OTHER)
Admission: RE | Admit: 2023-03-30 | Discharge: 2023-03-30 | Disposition: A | Discharge status: Home / Self Care | Payer: Medicare HMO | Source: Ambulatory Visit | Attending: Surgery | Admitting: Surgery

## 2023-03-30 ENCOUNTER — Ambulatory Visit (HOSPITAL_COMMUNITY)
Admission: RE | Admit: 2023-03-30 | Discharge: 2023-03-30 | Disposition: A | Discharge status: Home / Self Care | Payer: Medicare HMO | Source: Ambulatory Visit | Attending: Surgery | Admitting: Surgery

## 2023-03-30 VITALS — BP 120/70 | HR 48 | Temp 97.3°F | Ht 72.0 in | Wt 215.5 lb

## 2023-03-30 DIAGNOSIS — I739 Peripheral vascular disease, unspecified: Secondary | ICD-10-CM

## 2023-03-30 LAB — VAS US ABI WITH/WO TBI
Left ABI: 0.87
Right ABI: 0.65

## 2023-03-30 NOTE — Progress Notes (Signed)
 [facility] [place] HISTORY AND PHYSICAL   History of Present Illness:  Patient is a 89 y.o. year old male who presents for evaluation of PAD. He has a history of right SFA angioplasty and stent placement by Dr.Clark on 02/01/2019.  This was done to improve blood flow so he could heal a right first ray amputation on 02/03/2019 by Dr. Harden.  He then required a right second toe amputation on 08/02/2019 by Dr. Harden.  Due to poor healing, he ultimately required a right transmetatarsal amputation by Dr. Harden on 11/08/2020.  Postoperatively, the patient developed a hypertrophic callus with ulceration over his TMA.    Patient recently had right foot partial amputation at [facility].  That surgery had to be canceled 3 times and ultimately got done in the last week of November. He has be heel weight bearing for transfers.  He is living a more sedentary lifestyle.  His wife does his dressing changes and states the incision is healing well.  He was admitted to [facility] for SOB and CHF with fluid over load after holding his lasix .     He is here today for surveillance follow up to check his blood flow.   He is medically managed on ASA and daily Statin.  He has a history of CKD, nonischemic cardiomyopathy, TAVR in 2018.         Past Medical History:  Diagnosis Date   Anemia    Anxiety    situational- surgery   Asthma    as a child   Cancer (HCC) 2019   colon- colectomy    CHF (congestive heart failure) (HCC)    Chronic kidney disease    followed by Dr. Rexanne   Coronary artery disease    Diabetes mellitus without complication (HCC)    Type II   Dyslipidemia 10/27/2015   Dyspnea    w/ exertion    Elevated PSA 10/27/2015   Erectile dysfunction 10/27/2015   Heart murmur    Hypertension 10/27/2015   Hypogonadism male 10/27/2015   Neuropathy    Obesity 10/27/2015   Peripheral vascular disease (HCC)    Pneumonia 10/27/2015   pt states was 1982   Rotator cuff tear 10/27/2015    Past  Surgical History:  Procedure Laterality Date   ABDOMINAL AORTOGRAM N/A 02/01/2019   Procedure: ABDOMINAL AORTOGRAM;  Surgeon: Gretta Lonni PARAS, MD;  Location: [facility] INVASIVE CV LAB;  Service: Cardiovascular;  Laterality: N/A;   AMPUTATION Right 02/03/2019   Procedure: RIGHT FOOT 1ST RAY AMPUTATION;  Surgeon: Harden Jerona GAILS, MD;  Location: [facility] OR;  Service: Orthopedics;  Laterality: Right;   AMPUTATION Right 08/02/2019   Procedure: RIGHT 2ND TOE AMPUTATION;  Surgeon: Harden Jerona GAILS, MD;  Location: [facility] OR;  Service: Orthopedics;  Laterality: Right;   AMPUTATION Right 11/08/2020   Procedure: RIGHT TRANSMETATARSAL AMPUTATION;  Surgeon: Harden Jerona GAILS, MD;  Location: [facility] OR;  Service: Orthopedics;  Laterality: Right;   APPLICATION OF WOUND VAC  11/08/2020   Procedure: APPLICATION OF WOUND VAC;  Surgeon: Harden Jerona GAILS, MD;  Location: [facility] OR;  Service: Orthopedics;;   CARDIAC CATHETERIZATION N/A 11/14/2015   Procedure: Left Heart Cath and Coronary Angiography;  Surgeon: Victory LELON Sharps, MD;  Location: [facility] INVASIVE CV LAB;  Service: Cardiovascular;  Laterality: N/A;   COLONOSCOPY     LAPAROSCOPIC PARTIAL COLECTOMY  12/15/2017   LAPAROSCOPIC PARTIAL COLECTOMY (N/A Abdomen)   LAPAROSCOPIC PARTIAL COLECTOMY N/A 12/15/2017   Procedure: LAPAROSCOPIC PARTIAL COLECTOMY;  Surgeon:  Vanderbilt Ned, MD;  Location: [facility] OR;  Service: General;  Laterality: N/A;   LOWER EXTREMITY ANGIOGRAPHY Right 02/01/2019   Procedure: LOWER EXTREMITY ANGIOGRAPHY;  Surgeon: Gretta Lonni PARAS, MD;  Location: [facility] INVASIVE CV LAB;  Service: Cardiovascular;  Laterality: Right;   PERIPHERAL VASCULAR INTERVENTION Right 02/01/2019   Procedure: PERIPHERAL VASCULAR INTERVENTION;  Surgeon: Gretta Lonni PARAS, MD;  Location: [facility] INVASIVE CV LAB;  Service: Cardiovascular;  Laterality: Right;  SFA   RIGHT HEART CATH AND CORONARY ANGIOGRAPHY N/A 03/11/2023   Procedure: RIGHT HEART CATH AND CORONARY ANGIOGRAPHY;  Surgeon: Verlin Lonni BIRCH, MD;   Location: [facility] INVASIVE CV LAB;  Service: Cardiovascular;  Laterality: N/A;   TEE WITHOUT CARDIOVERSION N/A 12/15/2016   Procedure: TRANSESOPHAGEAL ECHOCARDIOGRAM (TEE);  Surgeon: Wonda Sharper, MD;  Location: [facility] OR;  Service: Open Heart Surgery;  Laterality: N/A;   TRANSCATHETER AORTIC VALVE REPLACEMENT, TRANSFEMORAL N/A 12/15/2016   Procedure: TRANSCATHETER AORTIC VALVE REPLACEMENT, TRANSFEMORAL;  Surgeon: Wonda Sharper, MD;  Location: [facility] OR;  Service: Open Heart Surgery;  Laterality: N/A;    ROS:   General:  No weight loss, Fever, chills  HEENT: No recent headaches, no nasal bleeding, no visual changes, no sore throat  Neurologic: No dizziness, blackouts, seizures. No recent symptoms of stroke or mini- stroke. No recent episodes of slurred speech, or temporary blindness.  Cardiac: No recent episodes of chest pain/pressure, no shortness of breath at rest.  No shortness of breath with exertion.  Denies history of atrial fibrillation or irregular heartbeat  Vascular: No history of rest pain in feet.  No history of claudication.  No history of non-healing ulcer, No history of DVT   Pulmonary: No home oxygen, no productive cough, no hemoptysis,  No asthma or wheezing  Musculoskeletal:  [ ]  Arthritis, [ ]  Low back pain,  [ ]  Joint pain  Hematologic:No history of hypercoagulable state.  No history of easy bleeding.  No history of anemia  Gastrointestinal: No hematochezia or melena,  No gastroesophageal reflux, no trouble swallowing  Urinary: [ ]  chronic Kidney disease, [ ]  on HD - [ ]  MWF or [ ]  TTHS, [ ]  Burning with urination, [ ]  Frequent urination, [ ]  Difficulty urinating;   Skin: No rashes  Psychological: No history of anxiety,  No history of depression  Social History Social History   Tobacco Use   Smoking status: Former    Types: Cigarettes    Passive exposure: Never   Smokeless tobacco: Never  Vaping Use   Vaping status: Never Used  Substance Use Topics   Alcohol  use:  Yes    Comment: rarely   Drug use: No    Family History Family History  Problem Relation Age of Onset   Diabetes Mother    Heart disease Mother    Pulmonary embolism Father     Allergies  Allergies  Allergen Reactions   Ticagrelor  Other (See Comments)    Unknown      Current Outpatient Medications  Medication Sig Dispense Refill   acetaminophen  (TYLENOL ) 500 MG tablet Take 1,000 mg by mouth as needed for moderate pain (pain score 4-6) or mild pain (pain score 1-3).     aspirin  81 MG EC tablet Take 81 mg by mouth daily. Swallow whole.     carvedilol  (COREG ) 3.125 MG tablet Take 1 tablet (3.125 mg total) by mouth 2 (two) times daily with a meal. 60 tablet 11   Cholecalciferol  (VITAMIN D ) 50 MCG (2000 UT) tablet Take 2,000 Units by mouth daily.  clopidogrel  (PLAVIX ) 75 MG tablet Take 1 tablet (75 mg total) by mouth daily. 30 tablet 11   diclofenac Sodium (VOLTAREN) 1 % GEL Apply 1 Application topically as needed (pain).     ferrous sulfate  325 (65 FE) MG tablet Take 325 mg by mouth daily with breakfast.     furosemide  (LASIX ) 80 MG tablet Take 80 mg by mouth Daily alternating 40 mg every other day. 45 tablet 5   JARDIANCE  25 MG TABS tablet Take 25 mg by mouth daily.     omega-3 acid ethyl esters (LOVAZA ) 1 g capsule Take 1 capsule (1 g total) by mouth daily. 90 capsule 3   oxyCODONE -acetaminophen  (PERCOCET/ROXICET) 5-325 MG tablet Take 1 tablet by mouth every 4 (four) hours as needed for severe pain. 30 tablet 0   rosuvastatin  (CRESTOR ) 40 MG tablet Take 1 tablet (40 mg total) by mouth daily. 30 tablet 5   sacubitril -valsartan  (ENTRESTO ) 49-51 MG Take 1 tablet by mouth 2 (two) times daily. Please attend scheduled appointment for additional refills. 60 tablet 11   Semaglutide,0.25 or 0.5MG /DOS, (OZEMPIC, 0.25 OR 0.5 MG/DOSE,) 2 MG/1.5ML SOPN Inject 0.5 mg into the skin once a week.     spironolactone  (ALDACTONE ) 25 MG tablet Take 0.5 tablets (12.5 mg total) by mouth daily. 45  tablet 0   VITAMIN B COMPLEX -C PO Take 1 tablet by mouth daily.     No current facility-administered medications for this visit.    Physical Examination  Vitals:   03/30/23 1313  BP: 120/70  Pulse: (!) 48  Temp: (!) 97.3 F (36.3 C)  SpO2: 95%  Weight: 215 lb 8 oz (97.8 kg)  Height: 6' (1.829 m)    Body mass index is 29.23 kg/m.  General:  Alert and oriented, no acute distress HEENT: Normal Neck: No bruit or JVD Pulmonary: Clear to auscultation bilaterally Cardiac: distant heart sound regular rhythm  Abdomen: Soft, non-tender, non-distended, no mass, no scars Skin: No rash Extremity Pulses:  radial,  femoral  pulses bilaterally Musculoskeletal: No deformity or edema  Neurologic: Upper and lower extremity motor grossly intact and symmetric  DATA:  ABI Findings:  +---------+------------------+-----+----------+--------+  Right   Rt Pressure (mmHg)IndexWaveform  Comment   +---------+------------------+-----+----------+--------+  Brachial 112                                        +---------+------------------+-----+----------+--------+  PTA     70                0.62 monophasic          +---------+------------------+-----+----------+--------+  DP      73                0.65 monophasic          +---------+------------------+-----+----------+--------+  Great Toe                                 amp       +---------+------------------+-----+----------+--------+   +---------+------------------+-----+----------+-------+  Left    Lt Pressure (mmHg)IndexWaveform  Comment  +---------+------------------+-----+----------+-------+  Brachial 111                                       +---------+------------------+-----+----------+-------+  PTA     97  0.87 monophasic         +---------+------------------+-----+----------+-------+  DP      84                0.75 monophasic          +---------+------------------+-----+----------+-------+  Great Toe80                0.71                    +---------+------------------+-----+----------+-------+   +-------+-----------+-----------+------------+------------+  ABI/TBIToday's ABIToday's TBIPrevious ABIPrevious TBI  +-------+-----------+-----------+------------+------------+  Right 0.65       amp        1.11        amp           +-------+-----------+-----------+------------+------------+  Left  0.87       0.71       0.78        0.55          +-------+-----------+-----------+------------+------------+       Right ABIs appear decreased compared to prior study on 04/06/22. Left ABIs  appear increased compared to prior study on 04/06/22.    Summary:  Right: Resting right ankle-brachial index indicates moderate right lower  extremity arterial disease.   Left: Resting left ankle-brachial index indicates mild left lower  extremity arterial disease. The left toe-brachial index is normal.   +-----------+--------+-----+--------+----------+--------+  RIGHT     PSV cm/sRatioStenosisWaveform  Comments  +-----------+--------+-----+--------+----------+--------+  CFA Mid    99                   triphasic           +-----------+--------+-----+--------+----------+--------+  DFA       83                   triphasic           +-----------+--------+-----+--------+----------+--------+  SFA Prox   87                   triphasic           +-----------+--------+-----+--------+----------+--------+  SFA Distal 74                   triphasic           +-----------+--------+-----+--------+----------+--------+  POP Prox   32                   triphasic           +-----------+--------+-----+--------+----------+--------+  POP Distal 46                   triphasic           +-----------+--------+-----+--------+----------+--------+  ATA Distal 16                    monophasic          +-----------+--------+-----+--------+----------+--------+  PTA Distal 11                   monophasic          +-----------+--------+-----+--------+----------+--------+  PERO Distal9                    monophasic          +-----------+--------+-----+--------+----------+--------+       Right Stent(s):  +---------------+--++---------++  Prox to Stent  46triphasic  +---------------+--++---------++  Proximal Stent 50triphasic  +---------------+--++---------++  Mid Stent      53triphasic  +---------------+--++---------++  Distal Stent   85triphasic  +---------------+--++---------++  Distal to Stent72triphasic  +---------------+--++---------++      Summary:  Right: Patent stent with no evidence of stenosis in the superficial  femoral artery artery.     ASSESSMENT/PLAN:  PAD with history of SFA stent placement to improve inflow to the right LE by Dr. Gretta 02/01/19.  The right LE angiogram showed: The right SFA lesion was ultimately crossed and predilated with a 5 mm x 120 mm Mustang.  Ultimately elected to stent the lesion and a 6 mm x 120 mm drug-eluting Eluvia.  There is no residual stenosis in the stent that is widely patent.  Patient has preserved two-vessel runoff via the peroneal and posterior tibial arteries.    The ABI's have decreased with wave forms from triphasic/biphasic to monophasic flow and from 1.11 index to 0.65.  The sent is patent with triphasic wave forms.  He has evidence of tibial disease with flow into all three vessels patent.  There is no obvious stenosis noted on duplex today.  I spoke with out ultrasound tech. I have asked him to do exercises that are non weight bearing to increased the blood flow demand.  If the the TMA revision fails to heal.  He will f/u in 6 months for repeat studies.       He has had TMA by Dr. Harden and more recently he has revision of the right foot at [facility] by Dr.  Louvella DPM.       Maurilio Deland Collet PA-C [facility] [place] Office: [phone]  MD in clinic [place]

## 2023-04-05 ENCOUNTER — Ambulatory Visit (HOSPITAL_BASED_OUTPATIENT_CLINIC_OR_DEPARTMENT_OTHER): Payer: Medicare HMO | Admitting: Family

## 2023-04-05 ENCOUNTER — Encounter (HOSPITAL_BASED_OUTPATIENT_CLINIC_OR_DEPARTMENT_OTHER): Payer: Self-pay | Admitting: Family

## 2023-04-05 VITALS — BP 115/73 | HR 45 | Ht 72.0 in | Wt 216.1 lb

## 2023-04-05 DIAGNOSIS — I5022 Chronic systolic (congestive) heart failure: Secondary | ICD-10-CM

## 2023-04-05 DIAGNOSIS — Z5181 Encounter for therapeutic drug level monitoring: Secondary | ICD-10-CM

## 2023-04-05 DIAGNOSIS — Z952 Presence of prosthetic heart valve: Secondary | ICD-10-CM | POA: Diagnosis not present

## 2023-04-05 NOTE — Patient Instructions (Addendum)
 Medication Instructions:  Your physician recommends that you continue on your current medications as directed. Please refer to the Current Medication list given to you today.   *If you need a refill on your cardiac medications before your next appointment, please call your pharmacy*  Lab Work: BMET/BNP TODAY  If you have labs (blood work) drawn today and your tests are completely normal, you will receive your results only by: MyChart Message (if you have MyChart) OR A paper copy in the mail If you have any lab test that is abnormal or we need to change your treatment, we will call you to review the results.  Testing/Procedures: NONE   Follow-Up: 05/31/2023 AS SCHEDULED WITH DR [facility]    Other Instructions YOUR HEART RATE MAY BE FALSELY LOW SECONDARY TO THE PREMATURE OR EARLY BEATS (PVC'S/PAC'S) YOU HAVE.    CALL THE OFFICE IF YOU HAVE WEIGHT GAIN OF 2 LBS IN 24 HOURS OR 5 LBS IN A WEEK.   Exercises to do While Sitting Warm-up Before starting other exercises: Sit up as straight as you can. Have your knees bent at 90 degrees, which is the shape of the capital letter L. Keep your feet flat on the floor. Sit at the front edge of your chair, if you can. Pull in (tighten) the muscles in your abdomen and stretch your spine and neck as straight as you can. Hold this position for a few minutes. Breathe in and out evenly. Try to concentrate on your breathing, and relax your mind.  Stretching Exercise A: Arm stretch Hold your arms out straight in front of your body. Bend your hands at the wrist with your fingers pointing up, as if signaling someone to stop. Notice the slight tension in your forearms as you hold the position. Keeping your arms out and your hands bent, rotate your hands outward as far as you can and hold this stretch. Aim to have your thumbs pointing up and your pinkie fingers pointing down. Slowly repeat arm stretches for one minute as tolerated. Exercise B: Leg  stretch If you can move your legs, try to draw letters on the floor with the toes of your foot. Write your name with one foot. Write your name with the toes of your other foot. Slowly repeat the movements for one minute as tolerated. Exercise C: Reach for the sky Reach your hands as far over your head as you can to stretch your spine. Move your hands and arms as if you are climbing a rope. Slowly repeat the movements for one minute as tolerated.  Range of motion exercises Exercise A: Shoulder roll Let your arms hang loosely at your sides. Lift just your shoulders up toward your ears, then let them relax back down. When your shoulders feel loose, rotate your shoulders in backward and forward circles. Do shoulder rolls slowly for one minute as tolerated. Exercise B: March in place As if you are marching, pump your arms and lift your legs up and down. Lift your knees as high as you can. If you are unable to lift your knees, just pump your arms and move your ankles and feet up and down. March in place for one minute as tolerated. Exercise C: Seated jumping jacks Let your arms hang down straight. Keeping your arms straight, lift them up over your head. Aim to point your fingers to the ceiling. While you lift your arms, straighten your legs and slide your heels along the floor to your sides, as wide as you  can. As you bring your arms back down to your sides, slide your legs back together. If you are unable to use your legs, just move your arms. Slowly repeat seated jumping jacks for one minute as tolerated.  Strengthening exercises Exercise A: Shoulder squeeze Hold your arms straight out from your body to your sides, with your elbows bent and your fists pointed at the ceiling. Keeping your arms in the bent position, move them forward so your elbows and forearms meet in front of your face. Open your arms back out as wide as you can with your elbows still bent, until you feel your shoulder  blades squeezing together. Hold for 5 seconds. Slowly repeat the movements forward and backward for one minute as tolerated.

## 2023-04-05 NOTE — Progress Notes (Signed)
 Cardiology Office Note:  .   Date:  04/05/2023  ID:  Tanda Converse, DOB 1933-07-27, MRN [id number] PCP: Rexanne Tanda, MD   [facility] Providers Cardiologist:  Annabella Scarce, MD    History of Present Illness: .   Lawerance Matsuo is a 89 y.o. male with history of NICM, HFrEF, AS s/p TAVR 2018, PAD s/p right SFA PTA/stent followed by right toe amputations and TMI, TIA 2022, CKD, DM2, colon cancer s/p colectomy, HTN.   2018 LVEF 25-30%. LHC with no significant CAD and underwent TAVR. Echo 1 month post TAVR LVEF 45-50%. 02/2021 LVEF 45-50%.   Admitted 03/09/23 with echo LVEF 25-30%, gr3dd, mean gradient across TAVR. 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 He was diuresed and GDMT titrated. Seen by Heart Failure TOC clinic 03/26/23 with improved dyspnea and resolved LE edema, orthopnea, PND. Coreg , Jardiance , Entresto , Spiro were continued. GDMT not further titrated due to concern for hypotension. Based on labs Lasix  adjusted to 80mg  and 40mg  every other day.   Seen by [facility] 03/30/23 with decreased BI with evidence of tibial disease with flow into all three vessels patietn with no obvious stenosis. He was recommended to do non weight bearing exercises  for TMA and follow up in 6 mos.  Presents today for follow up with his wife. Weight at home usually 210 - 212 lbs up to 214 lb since diuretic adjustment. Resport no swelling. Reports he is only short of breath only if he over-exerts himself. No orthopnea, PND.   ROS: Please see the history of present illness.    All other systems reviewed and are negative.   Studies Reviewed: .        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 Mild elevation right heart pressures PCWP 13 mmHg Cardiac output 4.3 L/min CI 1.95  Recommendations:  Continue medical management of non-ischemic cardiomyopathy. He has been adequately diuresed.  Findings Coronary Findings Diagnostic  Dominance: Co-dominant  Left Anterior Descending  Third Diagonal Branch Vessel is small in size.  Intervention  No interventions have been documented.   CARDIAC CATHETERIZATION  CARDIAC CATHETERIZATION 11/14/2015  Narrative  There is moderate to severe left ventricular systolic dysfunction.  LV end diastolic pressure is moderately elevated.  The left ventricular ejection fraction is 35-45% by visual estimate.  There is moderate aortic valve stenosis. There is trivial (1+) aortic regurgitation.   Calcific aortic valve disease with mild aortic regurgitation and mild to moderate aortic stenosis.  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  Widely patent normal appearing coronary arteries.  Estimated left ventricular ejection fraction is 30-35% with elevated end-diastolic pressure and global hypokinesis. Findings are compatible with nonischemic cardiomyopathy  Mild pulmonary hypertension  RECOMMENDATIONS:   Optimize medical therapy  Findings Coronary Findings Diagnostic  Dominance: Co-dominant  Left Anterior Descending  Third Diagonal Branch Vessel is small in size.  Intervention  No interventions have been documented.    ECHOCARDIOGRAM  ECHOCARDIOGRAM COMPLETE 03/09/2023  Narrative ECHOCARDIOGRAM REPORT    Patient Name:   JOVE BEYL Date of Exam: 03/09/2023 Medical Rec #:  [id number]     Height:       72.0 in Accession #:    [id number]    Weight:       220.0 lb Date of Birth:  1933-08-08  BSA:          2.219 m Patient Age:    89 years      BP:           128/95 mmHg Patient Gender: M             HR:           53 bpm. Exam Location:  Inpatient  Procedure: 2D Echo, Cardiac Doppler, Color Doppler and 3D Echo  Indications:    R94.31 Abnormal EKG  History:        Patient has prior  history of Echocardiogram examinations, most recent 03/13/2021. CHF, Abnormal ECG, TIA, Aortic Valve Disease; Risk Factors:Hypertension and Dyslipidemia. Severe aortic stenosis. TAVR. Aortic Valve: 26 mm Edwards Sapien prosthetic, stented (TAVR) valve is present in the aortic position. Procedure Date: 12/15/2016.  Sonographer:    Ellouise Mose RDCS Referring Phys: [id number] [facility]  IMPRESSIONS   1. Left ventricular ejection fraction, by estimation, is 25 to 30%. Left ventricular ejection fraction by 3D volume is 31 %. The left ventricle has severely decreased function. The left ventricle demonstrates global hypokinesis. The left ventricular internal cavity size was mildly dilated. Left ventricular diastolic parameters are consistent with Grade III diastolic dysfunction (restrictive). There is the interventricular septum is flattened in systole and diastole, consistent with right ventricular pressure and volume overload. 2. Right ventricular systolic function is low normal. The right ventricular size is moderately enlarged. Mildly increased right ventricular wall thickness. 3. Left atrial size was moderately dilated. 4. Right atrial size was moderately dilated. 5. There is no evidence of cardiac tamponade. 6. The mitral valve is degenerative. Mild mitral valve regurgitation. 7. The aortic valve has been repaired/replaced. Aortic valve regurgitation is not visualized. There is a 26 mm Edwards Sapien prosthetic (TAVR) valve present in the aortic position. Procedure Date: 12/15/2016. Aortic valve mean gradient measures 14.3 mmHg. Aortic valve Vmax measures 2.58 m/s. Aortic valve acceleration time measures 67 msec. 8. Aortic dilatation noted. There is dilatation of the ascending aorta, measuring 38 mm. There is dilatation of the ascending aorta, measuring 38 mm.  Comparison(s): Prior images reviewed side by side. The left ventricular function is significantly worse. The left ventricular diastolic  function is significantly worse. TAVR gradients are lower due to reduction in LV function.  FINDINGS Left Ventricle: Left ventricular ejection fraction, by estimation, is 25 to 30%. Left ventricular ejection fraction by 3D volume is 31 %. The left ventricle has severely decreased function. The left ventricle demonstrates global hypokinesis. The left ventricular internal cavity size was mildly dilated. There is no left ventricular hypertrophy. The interventricular septum is flattened in systole and diastole, consistent with right ventricular pressure and volume overload. Left ventricular diastolic function could not be evaluated due to atrial fibrillation. Left ventricular diastolic parameters are consistent with Grade III diastolic dysfunction (restrictive).  Right Ventricle: The right ventricular size is moderately enlarged. Mildly increased right ventricular wall thickness. Right ventricular systolic function is low normal.  Left Atrium: Left atrial size was moderately dilated.  Right Atrium: Right atrial size was moderately dilated.  Pericardium: Trivial pericardial effusion is present. There is no evidence of cardiac tamponade.  Mitral Valve: The mitral valve is degenerative in appearance. Mild mitral valve regurgitation.  Tricuspid Valve: The tricuspid valve is grossly normal. Tricuspid valve regurgitation is trivial. No evidence of tricuspid stenosis.  Aortic Valve: The aortic valve has been repaired/replaced. Aortic valve regurgitation is not visualized. Aortic valve mean gradient measures 14.3 mmHg. Aortic valve  peak gradient measures 26.6 mmHg. Aortic valve area, by VTI measures 1.50 cm. There is a 26 mm Edwards Sapien prosthetic, stented (TAVR) valve present in the aortic position. Procedure Date: 12/15/2016.  Pulmonic Valve: The pulmonic valve was normal in structure. Pulmonic valve regurgitation is not visualized. No evidence of pulmonic stenosis.  Aorta: Aortic dilatation noted  and the aortic root is normal in size and structure. There is dilatation of the ascending aorta, measuring 38 mm. There is dilatation of the ascending aorta, measuring 38 mm.  IAS/Shunts: No atrial level shunt detected by color flow Doppler.   LEFT VENTRICLE PLAX 2D LVIDd:         5.80 cm LVIDs:         5.00 cm LV PW:         1.10 cm         3D Volume EF LV IVS:        0.90 cm         LV 3D EF:    Left LVOT diam:     2.55 cm                      ventricul LV SV:         75                           ar LV SV Index:   34                           ejection LVOT Area:     5.11 cm                     fraction by 3D volume is LV Volumes (MOD)                            31 %. LV vol d, MOD    167.0 ml A2C: LV vol d, MOD    152.0 ml      3D Volume EF: A4C:                           3D EF:        31 % LV vol s, MOD    113.0 ml      LV EDV:       163 ml A2C:                           LV ESV:       112 ml LV vol s, MOD    110.0 ml      LV SV:        51 ml A4C: LV SV MOD A2C:   54.0 ml LV SV MOD A4C:   152.0 ml LV SV MOD BP:    50.9 ml  RIGHT VENTRICLE            IVC RV S prime:     9.68 cm/s  IVC diam: 1.90 cm TAPSE (M-mode): 1.6 cm  LEFT ATRIUM            Index        RIGHT ATRIUM           Index LA diam:      5.40 cm  2.43 cm/m  RA Area:     18.50 cm LA Vol (A2C): 116.0 ml 52.28 ml/m  RA Volume:   48.80 ml  22.00 ml/m LA Vol (A4C): 48.8 ml  21.97 ml/m AORTIC VALVE AV Area (Vmax):    1.55 cm AV Area (Vmean):   1.48 cm AV Area (VTI):     1.50 cm AV Vmax:           257.67 cm/s AV Vmean:          176.667 cm/s AV VTI:            0.502 m AV Peak Grad:      26.6 mmHg AV Mean Grad:      14.3 mmHg LVOT Vmax:         78.35 cm/s LVOT Vmean:        51.150 cm/s LVOT VTI:          0.148 m LVOT/AV VTI ratio: 0.29  AORTA Ao Root diam: 3.80 cm Ao Asc diam:  3.80 cm  MITRAL VALVE                TRICUSPID VALVE MV Area (PHT): 6.63 cm     TR Peak grad:   26.4 mmHg MV Decel  Time: 115 msec     TR Vmax:        257.00 cm/s MV E velocity: 114.25 cm/s SHUNTS Systemic VTI:  0.15 m Systemic Diam: 2.55 cm  Jerel Croitoru MD Electronically signed by Jerel Balding MD Signature Date/Time: 03/09/2023/4:23:09 PM    Final    CT SCANS  CT CORONARY MORPH W/CTA COR W/SCORE 11/13/2016  Addendum 11/13/2016  4:39 PM ADDENDUM REPORT: 11/13/2016 16:36  CLINICAL DATA:  Aortic stenosis  EXAM: Cardiac TAVR CT  TECHNIQUE: The patient was scanned on a Siemens [id number] scanner. A 120 kV retrospective scan was triggered in the ascending thoracic aorta at 140 HU's. Gantry rotation speed was 250 msecs and collimation was.6 mm. No beta blockade or nitro were given. The 3D data set was reconstructed in 5% intervals of the R-R cycle. Systolic and diastolic phases were analyzed on a dedicated work station using MPR, MIP and VRT modes. The patient received 80 cc of contrast.  FINDINGS: Aortic Valve:  Tri leaflet and calcified with restricted motion  Aorta: Mild calcific atherosclerotic debris with no aneurysm and no coarctation  Sinotubular Junction:  31 mm  Ascending Thoracic Aorta:  36 mm  Aortic Arch:  29 mm  Descending Thoracic Aorta:  28 mm  Sinus of Valsalva Measurements:  Non-coronary:  31 mm  Right -coronary:  30 mm  Left -coronary:  31 mm  Coronary Artery Height above Annulus:  Left Main:  16.4 mm above annulus  Right Coronary:  16.4 mm above annulus  Virtual Basal Annulus Measurements:  Maximum/Minimum Diameter:  27.2 mm x 21.3 mm  Perimeter:  76.5 mm  Area:  449 mm2  Coronary Arteries:  Sufficient height above annulus for deployment  Optimum Fluoroscopic Angle for Delivery: LAO 34 degrees Cranial 22 degrees  IMPRESSION: 1) Calcified tri leaflet aortic valve with annulus 449 mm2 suitable for a 26 mm Sapien 3 valve  2) Optimum angle for deployment LAO 34 degrees Cranial 22 degrees  3) Coronary arteries sufficient height above annulus for  deployment. 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 On: 11/13/2016 16:36  Narrative EXAM: OVER-READ INTERPRETATION  CT CHEST  The following report is an over-read performed by radiologist Dr. Toribio [facility]ove [facility] on 11/13/2016. This over-read does not include interpretation of cardiac or coronary anatomy or pathology. The coronary calcium  score/coronary CTA interpretation by the cardiologist is attached.  COMPARISON:  None.  FINDINGS: Extracardiac findings are described separately under dictation for contemporaneously obtained CTA of the chest, abdomen and pelvis.  IMPRESSION: Please see separate dictation for contemporaneously obtained CTA of the chest, abdomen and pelvis dated 11/13/2016 for full description of relevant extracardiac findings.  Electronically Signed: By: Toribio Aye M.D. On: 11/13/2016 14:56          Risk Assessment/Calculations:             Physical Exam:   VS:  BP 115/73 (BP Location: Left Arm, Patient Position: Sitting)   Pulse (!) 45   Ht 6' (1.829 m)   Wt 216 lb 1.6 oz (98 kg)   SpO2 97%   BMI 29.31 kg/m    Wt Readings from Last 3 Encounters:  04/05/23 216 lb 1.6 oz (98 kg)  03/30/23 215 lb 8 oz (97.8 kg)  03/26/23 214 lb 6.4 oz (97.3 kg)    GEN: Well nourished, well developed in no acute distress NECK: No JVD; No carotid bruits CARDIAC: RRR, no murmurs, rubs, gallops RESPIRATORY:  Clear to auscultation without rales, wheezing or rhonchi  ABDOMEN: Soft, non-tender, non-distended EXTREMITIES:  No edema; No deformity   ASSESSMENT AND PLAN: .    HFrEF - Previously recovered LVEF now reduced to 25-30% with NICM. Euvolemic on exam. GDMT Coreg , Jardiance , Entresto , Spiro. Lasix . Update BMPBNP today to reassess renal  function after reduction to Lasix  40mg  and 80mg  every other day. Titration of GDMT limited by relative hypotension.  Low sodium diet, fluid restriction <2L, and daily weights encouraged. Educated to contact our office for weight gain of 2 lbs overnight or 5 lbs in one week.   PAD s/p TM amputation - per [facility] and ortho. Remains non weight bearing on RLE. Given handout on seated exercises.   PVC - Noted by recent EKG. Suspect present bradycardia is inaccurate reading from pulse oximeter due to PVC. No lightheadedness, dizziness.   AS s/p TAVR 2018 - Stable by echo 02/2023.  CKD IIIb - baseline creatinine 1.5-1.8. Careful titration of diuretic and antihypertensive.         Dispo: follow up 05/2023 as scheduled  Signed, Reche GORMAN Finder, NP

## 2023-04-06 DIAGNOSIS — I10 Essential (primary) hypertension: Secondary | ICD-10-CM | POA: Diagnosis not present

## 2023-04-06 DIAGNOSIS — I739 Peripheral vascular disease, unspecified: Secondary | ICD-10-CM | POA: Diagnosis not present

## 2023-04-06 DIAGNOSIS — I5040 Unspecified combined systolic (congestive) and diastolic (congestive) heart failure: Secondary | ICD-10-CM | POA: Diagnosis not present

## 2023-04-06 DIAGNOSIS — Z952 Presence of prosthetic heart valve: Secondary | ICD-10-CM | POA: Diagnosis not present

## 2023-04-06 DIAGNOSIS — E11621 Type 2 diabetes mellitus with foot ulcer: Secondary | ICD-10-CM | POA: Diagnosis not present

## 2023-04-06 DIAGNOSIS — E7800 Pure hypercholesterolemia, unspecified: Secondary | ICD-10-CM | POA: Diagnosis not present

## 2023-04-06 LAB — BRAIN NATRIURETIC PEPTIDE: BNP: 367.2 pg/mL — ABNORMAL HIGH (ref 0.0–100.0)

## 2023-04-06 LAB — BASIC METABOLIC PANEL
BUN/Creatinine Ratio: 19 (ref 10–24)
BUN: 32 mg/dL — ABNORMAL HIGH (ref 8–27)
CO2: 23 mmol/L (ref 20–29)
Calcium: 9.5 mg/dL (ref 8.6–10.2)
Chloride: 104 mmol/L (ref 96–106)
Creatinine, Ser: 1.72 mg/dL — ABNORMAL HIGH (ref 0.76–1.27)
Glucose: 95 mg/dL (ref 70–99)
Potassium: 4.5 mmol/L (ref 3.5–5.2)
Sodium: 142 mmol/L (ref 134–144)
eGFR: 38 mL/min/{1.73_m2} — ABNORMAL LOW (ref >59)

## 2023-04-07 ENCOUNTER — Telehealth (HOSPITAL_BASED_OUTPATIENT_CLINIC_OR_DEPARTMENT_OTHER): Payer: Self-pay

## 2023-04-07 NOTE — Telephone Encounter (Signed)
 Called and spoke to pt's wife regarding lab results and recommendations. She verbalized understanding and will relay message to patient.  ----- Message from Reche GORMAN Finder sent at 04/07/2023  9:27 AM EST ----- Kidney function improved from prior. BNP with mild volume overload. Recommend continue Lasix  40mg  and 80mg  every other day. If weight gain of 2 lbs overnight or 5 lbs in one week, may take an additional 40mg  as needed.

## 2023-04-07 NOTE — Telephone Encounter (Signed)
-----   Message from Reche GORMAN Finder sent at 04/07/2023  9:27 AM EST ----- Kidney function improved from prior. BNP with mild volume overload. Recommend continue Lasix  40mg  and 80mg  every other day. If weight gain of 2 lbs overnight or 5 lbs in one week, may take an additional 40mg  as needed.

## 2023-04-09 ENCOUNTER — Other Ambulatory Visit: Payer: Self-pay

## 2023-04-09 DIAGNOSIS — I739 Peripheral vascular disease, unspecified: Secondary | ICD-10-CM

## 2023-04-10 ENCOUNTER — Encounter (HOSPITAL_BASED_OUTPATIENT_CLINIC_OR_DEPARTMENT_OTHER): Payer: Self-pay | Admitting: Family

## 2023-04-12 ENCOUNTER — Other Ambulatory Visit: Payer: Self-pay

## 2023-04-12 MED ORDER — ENTRESTO 49-51 MG PO TABS: 1.0000 | ORAL_TABLET | Freq: Two times a day (BID) | ORAL | 11 refills | Status: DC

## 2023-04-29 DIAGNOSIS — E11621 Type 2 diabetes mellitus with foot ulcer: Secondary | ICD-10-CM | POA: Diagnosis not present

## 2023-04-29 DIAGNOSIS — L97412 Non-pressure chronic ulcer of right heel and midfoot with fat layer exposed: Secondary | ICD-10-CM | POA: Diagnosis not present

## 2023-04-29 DIAGNOSIS — I739 Peripheral vascular disease, unspecified: Secondary | ICD-10-CM | POA: Diagnosis not present

## 2023-04-29 DIAGNOSIS — T8789 Other complications of amputation stump: Secondary | ICD-10-CM | POA: Diagnosis not present

## 2023-04-29 DIAGNOSIS — E1122 Type 2 diabetes mellitus with diabetic chronic kidney disease: Secondary | ICD-10-CM | POA: Diagnosis not present

## 2023-04-29 DIAGNOSIS — N183 Chronic kidney disease, stage 3 unspecified: Secondary | ICD-10-CM | POA: Diagnosis not present

## 2023-04-29 DIAGNOSIS — E1151 Type 2 diabetes mellitus with diabetic peripheral angiopathy without gangrene: Secondary | ICD-10-CM | POA: Diagnosis not present

## 2023-04-29 DIAGNOSIS — Y838 Other surgical procedures as the cause of abnormal reaction of the patient, or of later complication, without mention of misadventure at the time of the procedure: Secondary | ICD-10-CM | POA: Diagnosis not present

## 2023-05-06 ENCOUNTER — Other Ambulatory Visit: Payer: Self-pay | Admitting: Interventional Cardiology

## 2023-05-11 ENCOUNTER — Telehealth: Payer: Self-pay

## 2023-05-11 ENCOUNTER — Other Ambulatory Visit (HOSPITAL_COMMUNITY): Payer: Self-pay

## 2023-05-11 DIAGNOSIS — I739 Peripheral vascular disease, unspecified: Secondary | ICD-10-CM

## 2023-05-11 DIAGNOSIS — E781 Pure hyperglyceridemia: Secondary | ICD-10-CM

## 2023-05-11 NOTE — Telephone Encounter (Signed)
Pharmacy Patient Advocate Encounter   Received notification from CoverMyMeds that prior authorization for LOVAZA is required/requested.   Insurance verification completed.   The patient is insured through [facility] [employer] .   Per test claim:  VASCEPA Charlsie Quest is preferred by the insurance.  If suggested medication is appropriate, Please send in a new RX and discontinue this one. If not, please advise as to why it's not appropriate so that we may request a Prior Authorization. Please note, some preferred medications may still require a PA.  If the suggested medications have not been trialed and there are no contraindications to their use, the PA will not be submitted, as it will not be approved.

## 2023-05-13 ENCOUNTER — Other Ambulatory Visit (HOSPITAL_COMMUNITY): Payer: Self-pay

## 2023-05-13 DIAGNOSIS — I5040 Unspecified combined systolic (congestive) and diastolic (congestive) heart failure: Secondary | ICD-10-CM | POA: Diagnosis not present

## 2023-05-13 MED ORDER — ICOSAPENT ETHYL 1 G PO CAPS: 2.0000 g | ORAL_CAPSULE | Freq: Two times a day (BID) | ORAL | 1 refills | Status: DC

## 2023-05-13 NOTE — Telephone Encounter (Signed)
Per test claim no PA needed on the brand Vascepa. Showing refill too soon. It was filled today.  Patient should be good to pick up at [facility].

## 2023-05-13 NOTE — Addendum Note (Signed)
Addended by: Alver Sorrow on: 05/13/2023 10:17 AM   Modules accepted: Orders

## 2023-05-13 NOTE — Telephone Encounter (Signed)
Please let him know Lovaza no longer covered by his insurance plan, but Tillman Sers is covered. This is a similar medication that will help to lower triglycerides and lower cardiovascular risk.  Please be sure he is aware of change and dosing of 2 tabs BID.   Alver Sorrow, NP

## 2023-05-13 NOTE — Telephone Encounter (Signed)
Rx for Vascepa 2g BID sent to [facility] for cardiovascular risk reduction (PAD, coronary calcification on CT) and hypertriglyceridemia. Lovaza discontinued.   Will route to prior authorization team for assistance in obtaining PA.  Once PA obtained will route to nursing team to call patient to make him aware.  Alver Sorrow, NP

## 2023-05-20 ENCOUNTER — Other Ambulatory Visit: Payer: Self-pay | Admitting: Interventional Cardiology

## 2023-05-20 MED ORDER — CLOPIDOGREL BISULFATE 75 MG PO TABS: 75.0000 mg | ORAL_TABLET | Freq: Every day | ORAL | 3 refills | Status: DC

## 2023-05-24 ENCOUNTER — Other Ambulatory Visit: Payer: Self-pay

## 2023-05-24 MED ORDER — CARVEDILOL 3.125 MG PO TABS: 3.1250 mg | ORAL_TABLET | Freq: Two times a day (BID) | ORAL | 11 refills | Status: DC

## 2023-05-31 ENCOUNTER — Encounter (HOSPITAL_BASED_OUTPATIENT_CLINIC_OR_DEPARTMENT_OTHER): Payer: Self-pay | Admitting: Cardiovascular Disease

## 2023-05-31 ENCOUNTER — Ambulatory Visit (HOSPITAL_BASED_OUTPATIENT_CLINIC_OR_DEPARTMENT_OTHER): Payer: Medicare HMO | Admitting: Cardiovascular Disease

## 2023-05-31 VITALS — BP 118/78 | HR 76 | Ht 72.0 in | Wt 219.4 lb

## 2023-05-31 DIAGNOSIS — N183 Chronic kidney disease, stage 3 unspecified: Secondary | ICD-10-CM

## 2023-05-31 DIAGNOSIS — E782 Mixed hyperlipidemia: Secondary | ICD-10-CM | POA: Diagnosis not present

## 2023-05-31 DIAGNOSIS — I5042 Chronic combined systolic (congestive) and diastolic (congestive) heart failure: Secondary | ICD-10-CM | POA: Diagnosis not present

## 2023-05-31 DIAGNOSIS — Z952 Presence of prosthetic heart valve: Secondary | ICD-10-CM

## 2023-05-31 DIAGNOSIS — I493 Ventricular premature depolarization: Secondary | ICD-10-CM

## 2023-05-31 DIAGNOSIS — G459 Transient cerebral ischemic attack, unspecified: Secondary | ICD-10-CM | POA: Diagnosis not present

## 2023-05-31 DIAGNOSIS — I10 Essential (primary) hypertension: Secondary | ICD-10-CM | POA: Diagnosis not present

## 2023-05-31 DIAGNOSIS — I350 Nonrheumatic aortic (valve) stenosis: Secondary | ICD-10-CM | POA: Diagnosis not present

## 2023-05-31 MED ORDER — EZETIMIBE 10 MG PO TABS: 10.0000 mg | ORAL_TABLET | Freq: Every day | ORAL | 3 refills | Status: DC

## 2023-05-31 MED ORDER — SPIRONOLACTONE 25 MG PO TABS: 12.5000 mg | ORAL_TABLET | Freq: Every day | ORAL | 3 refills | Status: DC

## 2023-05-31 NOTE — Progress Notes (Signed)
 Cardiology Office Note:  .   Date:  05/31/2023  ID:  Robert Horn, DOB 06/06/1933, MRN [id number] PCP: Robert Dills, MD  [facility] Providers Cardiologist:  Robert Si, MD    History of Present Illness: .    Robert Horn is a 89 y.o. male with aortic stenosis status post TAVR (2018) PAD status post right SFA PTA/stent, right toe amputations, HFmrEF, TIA, CKD 3, diabetes, colon cancer status post colectomy, hypertension, and hyperlipidemia here for follow-up.  He developed heart failure in 2018.  At that time LVEF was 25-35%.  Left heart cath revealed nonobstructive disease.  He underwent TAVR for severe AS.  1 month after echo revealed LVEF 45-50%.  He was admitted in 2014 with acute on chronic heart failure.  LVEF was 25-35% and he had grade 3 diastolic dysfunction.  Mean gradient across the TAVR valve was 14 mmHg.  Left heart cath again revealed nonobstructive disease.  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  He was evaluate by the heart failure team and GDMT was titrated.  He is followed by [facility] for PAD.  He last saw Robert Shields, NP 03/2023.  At that time his weight was up with acute on chronic systolic and S of heart failure.  He was educated on fluid restrictions.  BNP was 367.  He is also struggled with acute on chronic renal failure.  He was instructed to continue with alternating Lasix 40 and 80 mg every other day.  He was informed that he could take an additional 40 mg as needed.  Discussed the use of AI scribe software for clinical note transcription with the patient, who gave verbal consent to proceed.  [name] experiences intermittent nocturnal dyspnea for about five months, which improves with positional changes. He has not taken additional Lasix for these episodes since his hospital discharge. No chest pain or pressure, and his weight has remained stable.  He is currently on several medications, including Lasix, Vascepa, rosuvastatin,  spironolactone, aspirin, Plavix, carvedilol, Entresto, Zetia, and Jardiance. His wife mentions a disruption in his medication schedule due to a canceled foot surgery, leading to a period without furosemide, potentially contributing to recent health issues. He is on Ozempic but has not been consistent with it since his hospital discharge.  His physical activity has decreased significantly due to health issues. He was previously very active, having served in [employer] and played sports, including golf. He wants to return to playing golf in the spring. He experiences exertional dyspnea and wheezing, attributing this to prolonged foot issues.  He reports ongoing issues with his foot, believing it affects his overall health. He has not experienced any falls, although he almost fell recently. His balance is reported as good.      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  Mild elevation right heart pressures PCWP 13 mmHg Cardiac output 4.3 L/min CI 1.95   Recommendations: Continue medical management of non-ischemic cardiomyopathy. He has been adequately diuresed.   Echo 02/2023:    1. Left ventricular ejection fraction, by estimation, is 25 to 30%. Left  ventricular ejection fraction by 3D volume is 31 %. The left ventricle has  severely decreased function. The left ventricle demonstrates global  hypokinesis. The left ventricular  internal cavity size was mildly dilated. Left ventricular diastolic  parameters are consistent  with Grade III diastolic dysfunction  (restrictive). There is the interventricular septum is flattened in  systole and diastole, consistent with right ventricular   pressure and volume overload.   2. Right ventricular systolic function is low normal. The right  ventricular size is moderately enlarged. Mildly increased right  ventricular wall thickness.   3. Left  atrial size was moderately dilated.   4. Right atrial size was moderately dilated.   5. There is no evidence of cardiac tamponade.   6. The mitral valve is degenerative. Mild mitral valve regurgitation.   7. The aortic valve has been repaired/replaced. Aortic valve  regurgitation is not visualized. There is a 26 mm Edwards Sapien  prosthetic (TAVR) valve present in the aortic position. Procedure Date:  12/15/2016. Aortic valve mean gradient measures 14.3  mmHg. Aortic valve Vmax measures 2.58 m/s. Aortic valve acceleration time  measures 67 msec.   8. Aortic dilatation noted. There is dilatation of the ascending aorta,  measuring 38 mm. There is dilatation of the ascending aorta, measuring 38  mm.   ABI 02/2023: Right ABIs appear decreased compared to prior study on 04/06/22. Left ABIs  appear increased compared to prior study on 04/06/22.    Summary:  Right: Resting right ankle-brachial index indicates moderate right lower  extremity arterial disease.   Left: Resting left ankle-brachial index indicates mild left lower  extremity arterial disease. The left toe-brachial index is normal.    Risk Assessment/Calculations:             Physical Exam:   VS:  BP 118/78   Pulse 76   Ht 6' (1.829 m)   Wt 219 lb 6.4 oz (99.5 kg)   SpO2 97%   BMI 29.76 kg/m  , BMI Body mass index is 29.76 kg/m. GENERAL:  Well appearing HEENT: Pupils equal round and reactive, fundi not visualized, oral mucosa unremarkable NECK:  No jugular venous distention, waveform within normal limits, carotid upstroke brisk and symmetric, no bruits, no thyromegaly LUNGS:  Clear to auscultation bilaterally HEART:  RRR.  PMI not displaced or sustained,S1 and S2 within normal limits, no S3, no S4, no clicks, no rubs, no murmurs ABD:  Flat, positive bowel sounds normal in frequency in pitch, no bruits, no rebound, no guarding, no midline pulsatile mass, no hepatomegaly, no splenomegaly EXT: No edema, no cyanosis no  clubbing.  R TMA. SKIN:  No rashes no nodules NEURO:  Cranial nerves II through XII grossly intact, motor grossly intact throughout PSYCH:  Cognitively intact, oriented to person place and time   ASSESSMENT AND PLAN: .    # HFrEF:  #Hypertension:  LVEF 25-30%.  He is euvolemic now.  He attributes recent weight gain to being off Ozempic.  He is on good GDMT with carvedilol, Jardiance, Entreresto, spironolactone and lasix.    # PAD s/p amputation:  # Hyperlipidemia:  Healing process ongoing. No current complications reported. -Continue current wound care regimen. -Continue f/u with [facility].  - Continue aspirin, clopidogrel, and lipid management.   - Return for fasting lipids/CMP.  LDL goal <55.    # Obesity:  Increase in weight noted, potentially due to change in eating habits and inconsistent use of Ozempic. -Resume regular use of Ozempic. -Monitor weight closely for potential fluid retention.  # Physical Inactivity Limited physical activity due to ongoing foot wound. -Enroll in cardiac rehabilitation program to improve stamina and cardiovascular health.  # Hyperlipidemia Medication refills needed for Zetia and rosuvastatin. -Refill Zetia and rosuvastatin for three  months. -Perform fasting lipid panel at next convenience.  Follow-up Stable condition with no changes in medication. -Schedule follow-up appointment with PA in four months. -Schedule follow-up appointment with cardiologist in eight months. -Repeat echocardiogram in six months to assess heart function.      Signed, Robert Si, MD

## 2023-05-31 NOTE — Patient Instructions (Signed)
 Medication Instructions:  Your physician recommends that you continue on your current medications as directed. Please refer to the Current Medication list given to you today.   *If you need a refill on your cardiac medications before your next appointment, please call your pharmacy*  Lab Work: FASTING LP/CMET SOON   If you have labs (blood work) drawn today and your tests are completely normal, you will receive your results only by: MyChart Message (if you have MyChart) OR A paper copy in the mail If you have any lab test that is abnormal or we need to change your treatment, we will call you to review the results.  Testing/Procedures: NONE  Follow-Up: At [facility], you and your health needs are our priority.  As part of our continuing mission to provide you with exceptional heart care, we have created designated Provider Care Teams.  These Care Teams include your primary Cardiologist (physician) and Advanced Practice Providers (APPs -  Physician Assistants and Nurse Practitioners) who all work together to provide you with the care you need, when you need it.  We recommend signing up for the patient portal called "MyChart".  Sign up information is provided on this After Visit Summary.  MyChart is used to connect with patients for Virtual Visits (Telemedicine).  Patients are able to view lab/test results, encounter notes, upcoming appointments, etc.  Non-urgent messages can be sent to your provider as well.   To learn more about what you can do with MyChart, go to [email or web address].    Your next appointment:   4 month(s)  Provider:   Gillian Shields, NP    8 MONTHS WITH DR [facility]   Other Instructions You have been referred to CARDIAC [facility]

## 2023-06-10 ENCOUNTER — Telehealth (HOSPITAL_COMMUNITY): Payer: Self-pay | Admitting: *Deleted

## 2023-06-10 NOTE — Telephone Encounter (Signed)
 Received call from [place], pt wife who is listed on his DPR, desiring to schedule he husband for cardiac rehab.  Spoke with pt to gain understanding of where he is in his recover from his partial amputation of his right foot.  Per [place] Mole, pt  has been released from the care of Dr. Veleta Miners at [facility] with no restrictions, full weight bearing on his foot. No further wound care warranted.  Pt can transition to diabetic shoe when he is ready. Pt wears transitional shoe per preference. The transitional shoe has hard bottom with cloth straps that velcro across. Feel this is safe and appropriate for this pt to wear in cardiac rehab. Pt does not need any assistive device to get around.Will forward to support staff to verify insurance benefits and schedule.  Pt wife did indicate he would more than likely need an afternoon class. Alanson Aly, BSN Cardiac and Emergency planning/management officer

## 2023-06-11 ENCOUNTER — Telehealth (HOSPITAL_COMMUNITY): Payer: Self-pay

## 2023-06-11 DIAGNOSIS — Z03818 Encounter for observation for suspected exposure to other biological agents ruled out: Secondary | ICD-10-CM | POA: Diagnosis not present

## 2023-06-11 DIAGNOSIS — R059 Cough, unspecified: Secondary | ICD-10-CM | POA: Diagnosis not present

## 2023-06-11 NOTE — Telephone Encounter (Signed)
 Called and spoke with pt wife Robert Horn (on [place]) in regards to CR. She stated pt is interested in CR, patient will come in for orientation on 06/22/23 @ 1:15PM and will attend the 1:45PM exercise class.   Pensions consultant.

## 2023-06-11 NOTE — Telephone Encounter (Signed)
 Pt insurance is active and benefits verified through [facility]. Co-pay $40.00, DED $0.00/$0.00 met, out of pocket $5,500.00/$40.00 met, co-insurance 0%. No pre-authorization required. Passport, 06/11/23 @ 3:34PM, REF#[id number]   How many CR sessions are covered? (36 visits for TCR, 72 visits for ICR)72 Is this a lifetime maximum or an annual maximum? Annual Has the member used any of these services to date? No Is there a time limit (weeks/months) on start of program and/or program completion? No

## 2023-06-16 ENCOUNTER — Inpatient Hospital Stay (HOSPITAL_COMMUNITY)
Admission: EM | Admit: 2023-06-16 | Discharge: 2023-06-21 | DRG: 062 | Disposition: A | Discharge status: Home Health | Attending: Neurology | Admitting: Neurology

## 2023-06-16 ENCOUNTER — Encounter (HOSPITAL_COMMUNITY): Payer: Self-pay

## 2023-06-16 ENCOUNTER — Emergency Department (HOSPITAL_COMMUNITY)

## 2023-06-16 ENCOUNTER — Other Ambulatory Visit: Payer: Self-pay

## 2023-06-16 DIAGNOSIS — I6389 Other cerebral infarction: Secondary | ICD-10-CM | POA: Diagnosis not present

## 2023-06-16 DIAGNOSIS — R4781 Slurred speech: Secondary | ICD-10-CM | POA: Diagnosis not present

## 2023-06-16 DIAGNOSIS — Z7985 Long-term (current) use of injectable non-insulin antidiabetic drugs: Secondary | ICD-10-CM

## 2023-06-16 DIAGNOSIS — I10 Essential (primary) hypertension: Secondary | ICD-10-CM | POA: Diagnosis not present

## 2023-06-16 DIAGNOSIS — I5020 Unspecified systolic (congestive) heart failure: Secondary | ICD-10-CM | POA: Insufficient documentation

## 2023-06-16 DIAGNOSIS — W19XXXA Unspecified fall, initial encounter: Secondary | ICD-10-CM | POA: Diagnosis not present

## 2023-06-16 DIAGNOSIS — I493 Ventricular premature depolarization: Secondary | ICD-10-CM | POA: Diagnosis present

## 2023-06-16 DIAGNOSIS — E1122 Type 2 diabetes mellitus with diabetic chronic kidney disease: Secondary | ICD-10-CM | POA: Diagnosis not present

## 2023-06-16 DIAGNOSIS — E1165 Type 2 diabetes mellitus with hyperglycemia: Secondary | ICD-10-CM | POA: Diagnosis not present

## 2023-06-16 DIAGNOSIS — I63412 Cerebral infarction due to embolism of left middle cerebral artery: Secondary | ICD-10-CM | POA: Diagnosis not present

## 2023-06-16 DIAGNOSIS — E785 Hyperlipidemia, unspecified: Secondary | ICD-10-CM | POA: Diagnosis present

## 2023-06-16 DIAGNOSIS — E1151 Type 2 diabetes mellitus with diabetic peripheral angiopathy without gangrene: Secondary | ICD-10-CM | POA: Diagnosis not present

## 2023-06-16 DIAGNOSIS — I63512 Cerebral infarction due to unspecified occlusion or stenosis of left middle cerebral artery: Secondary | ICD-10-CM

## 2023-06-16 DIAGNOSIS — R29818 Other symptoms and signs involving the nervous system: Secondary | ICD-10-CM | POA: Diagnosis not present

## 2023-06-16 DIAGNOSIS — Z743 Need for continuous supervision: Secondary | ICD-10-CM | POA: Diagnosis not present

## 2023-06-16 DIAGNOSIS — Z79899 Other long term (current) drug therapy: Secondary | ICD-10-CM

## 2023-06-16 DIAGNOSIS — Z8616 Personal history of COVID-19: Secondary | ICD-10-CM

## 2023-06-16 DIAGNOSIS — I2510 Atherosclerotic heart disease of native coronary artery without angina pectoris: Secondary | ICD-10-CM | POA: Diagnosis present

## 2023-06-16 DIAGNOSIS — Z8673 Personal history of transient ischemic attack (TIA), and cerebral infarction without residual deficits: Secondary | ICD-10-CM | POA: Diagnosis not present

## 2023-06-16 DIAGNOSIS — Z833 Family history of diabetes mellitus: Secondary | ICD-10-CM

## 2023-06-16 DIAGNOSIS — I959 Hypotension, unspecified: Secondary | ICD-10-CM | POA: Insufficient documentation

## 2023-06-16 DIAGNOSIS — N1832 Chronic kidney disease, stage 3b: Secondary | ICD-10-CM | POA: Diagnosis present

## 2023-06-16 DIAGNOSIS — I4819 Other persistent atrial fibrillation: Secondary | ICD-10-CM | POA: Diagnosis present

## 2023-06-16 DIAGNOSIS — I739 Peripheral vascular disease, unspecified: Secondary | ICD-10-CM | POA: Diagnosis not present

## 2023-06-16 DIAGNOSIS — Z7984 Long term (current) use of oral hypoglycemic drugs: Secondary | ICD-10-CM | POA: Diagnosis not present

## 2023-06-16 DIAGNOSIS — Z888 Allergy status to other drugs, medicaments and biological substances status: Secondary | ICD-10-CM

## 2023-06-16 DIAGNOSIS — I5022 Chronic systolic (congestive) heart failure: Secondary | ICD-10-CM | POA: Diagnosis not present

## 2023-06-16 DIAGNOSIS — R29706 NIHSS score 6: Secondary | ICD-10-CM | POA: Diagnosis not present

## 2023-06-16 DIAGNOSIS — Z87891 Personal history of nicotine dependence: Secondary | ICD-10-CM | POA: Diagnosis not present

## 2023-06-16 DIAGNOSIS — N183 Chronic kidney disease, stage 3 unspecified: Secondary | ICD-10-CM | POA: Diagnosis present

## 2023-06-16 DIAGNOSIS — Z85038 Personal history of other malignant neoplasm of large intestine: Secondary | ICD-10-CM

## 2023-06-16 DIAGNOSIS — Z7901 Long term (current) use of anticoagulants: Secondary | ICD-10-CM | POA: Diagnosis not present

## 2023-06-16 DIAGNOSIS — I480 Paroxysmal atrial fibrillation: Secondary | ICD-10-CM | POA: Diagnosis not present

## 2023-06-16 DIAGNOSIS — I5082 Biventricular heart failure: Secondary | ICD-10-CM | POA: Diagnosis not present

## 2023-06-16 DIAGNOSIS — Z952 Presence of prosthetic heart valve: Secondary | ICD-10-CM

## 2023-06-16 DIAGNOSIS — I6782 Cerebral ischemia: Secondary | ICD-10-CM | POA: Diagnosis not present

## 2023-06-16 DIAGNOSIS — G4733 Obstructive sleep apnea (adult) (pediatric): Secondary | ICD-10-CM | POA: Diagnosis not present

## 2023-06-16 DIAGNOSIS — Z89431 Acquired absence of right foot: Secondary | ICD-10-CM | POA: Diagnosis not present

## 2023-06-16 DIAGNOSIS — I639 Cerebral infarction, unspecified: Secondary | ICD-10-CM | POA: Diagnosis present

## 2023-06-16 DIAGNOSIS — Z8249 Family history of ischemic heart disease and other diseases of the circulatory system: Secondary | ICD-10-CM

## 2023-06-16 DIAGNOSIS — R000 Tachycardia, unspecified: Secondary | ICD-10-CM | POA: Diagnosis not present

## 2023-06-16 DIAGNOSIS — Z9049 Acquired absence of other specified parts of digestive tract: Secondary | ICD-10-CM

## 2023-06-16 DIAGNOSIS — E119 Type 2 diabetes mellitus without complications: Secondary | ICD-10-CM

## 2023-06-16 DIAGNOSIS — I130 Hypertensive heart and chronic kidney disease with heart failure and stage 1 through stage 4 chronic kidney disease, or unspecified chronic kidney disease: Secondary | ICD-10-CM | POA: Diagnosis not present

## 2023-06-16 DIAGNOSIS — I428 Other cardiomyopathies: Secondary | ICD-10-CM | POA: Diagnosis present

## 2023-06-16 DIAGNOSIS — Z043 Encounter for examination and observation following other accident: Secondary | ICD-10-CM | POA: Diagnosis not present

## 2023-06-16 DIAGNOSIS — I4891 Unspecified atrial fibrillation: Secondary | ICD-10-CM | POA: Diagnosis present

## 2023-06-16 DIAGNOSIS — Z7902 Long term (current) use of antithrombotics/antiplatelets: Secondary | ICD-10-CM

## 2023-06-16 DIAGNOSIS — G4731 Primary central sleep apnea: Secondary | ICD-10-CM | POA: Diagnosis present

## 2023-06-16 DIAGNOSIS — R4701 Aphasia: Secondary | ICD-10-CM

## 2023-06-16 DIAGNOSIS — R29701 NIHSS score 1: Secondary | ICD-10-CM | POA: Diagnosis not present

## 2023-06-16 DIAGNOSIS — I6523 Occlusion and stenosis of bilateral carotid arteries: Secondary | ICD-10-CM | POA: Diagnosis not present

## 2023-06-16 DIAGNOSIS — I672 Cerebral atherosclerosis: Secondary | ICD-10-CM | POA: Diagnosis not present

## 2023-06-16 DIAGNOSIS — I63413 Cerebral infarction due to embolism of bilateral middle cerebral arteries: Secondary | ICD-10-CM | POA: Diagnosis not present

## 2023-06-16 DIAGNOSIS — R9082 White matter disease, unspecified: Secondary | ICD-10-CM | POA: Diagnosis not present

## 2023-06-16 DIAGNOSIS — Z7982 Long term (current) use of aspirin: Secondary | ICD-10-CM

## 2023-06-16 LAB — HEMOGLOBIN AND HEMATOCRIT, BLOOD
HCT: 37.3 % — ABNORMAL LOW (ref 39.0–52.0)
HCT: 34.8 % — ABNORMAL LOW (ref 39.0–52.0)
Hemoglobin: 11.5 g/dL — ABNORMAL LOW (ref 13.0–17.0)
Hemoglobin: 11.2 g/dL — ABNORMAL LOW (ref 13.0–17.0)

## 2023-06-16 LAB — GLUCOSE, CAPILLARY
Glucose-Capillary: 108 mg/dL — ABNORMAL HIGH (ref 70–99)
Glucose-Capillary: 115 mg/dL — ABNORMAL HIGH (ref 70–99)

## 2023-06-16 LAB — I-STAT CHEM 8, ED
BUN: 35 mg/dL — ABNORMAL HIGH (ref 8–23)
Calcium, Ion: 1.12 mmol/L — ABNORMAL LOW (ref 1.15–1.40)
Chloride: 112 mmol/L — ABNORMAL HIGH (ref 98–111)
Creatinine, Ser: 2.2 mg/dL — ABNORMAL HIGH (ref 0.61–1.24)
Glucose, Bld: 122 mg/dL — ABNORMAL HIGH (ref 70–99)
HCT: 43 % (ref 39.0–52.0)
Hemoglobin: 14.6 g/dL (ref 13.0–17.0)
Potassium: 4.2 mmol/L (ref 3.5–5.1)
Sodium: 140 mmol/L (ref 135–145)
TCO2: 19 mmol/L — ABNORMAL LOW (ref 22–32)

## 2023-06-16 LAB — CBC
HCT: 42 % (ref 39.0–52.0)
Hemoglobin: 13.1 g/dL (ref 13.0–17.0)
MCH: 29.1 pg (ref 26.0–34.0)
MCHC: 31.2 g/dL (ref 30.0–36.0)
MCV: 93.3 fL (ref 80.0–100.0)
Platelets: 234 10*3/uL (ref 150–400)
RBC: 4.5 MIL/uL (ref 4.22–5.81)
RDW: 14.8 % (ref 11.5–15.5)
WBC: 6.4 10*3/uL (ref 4.0–10.5)
nRBC: 0 % (ref 0.0–0.2)

## 2023-06-16 LAB — DIFFERENTIAL
Abs Immature Granulocytes: 0.02 10*3/uL (ref 0.00–0.07)
Basophils Absolute: 0.1 10*3/uL (ref 0.0–0.1)
Basophils Relative: 1 %
Eosinophils Absolute: 0.2 10*3/uL (ref 0.0–0.5)
Eosinophils Relative: 4 %
Immature Granulocytes: 0 %
Lymphocytes Relative: 30 %
Lymphs Abs: 1.9 10*3/uL (ref 0.7–4.0)
Monocytes Absolute: 1.1 10*3/uL — ABNORMAL HIGH (ref 0.1–1.0)
Monocytes Relative: 18 %
Neutro Abs: 3 10*3/uL (ref 1.7–7.7)
Neutrophils Relative %: 47 %

## 2023-06-16 LAB — URINALYSIS, ROUTINE W REFLEX MICROSCOPIC
Bilirubin Urine: NEGATIVE
Glucose, UA: >=500 mg/dL — AB
Hgb urine dipstick: NEGATIVE
Ketones, ur: NEGATIVE mg/dL
Nitrite: NEGATIVE
Protein, ur: NEGATIVE mg/dL
Specific Gravity, Urine: 1.024 (ref 1.005–1.030)
WBC, UA: >50 WBC/hpf (ref 0–5)
pH: 5 (ref 5.0–8.0)

## 2023-06-16 LAB — PROTIME-INR
INR: 1.1 (ref 0.8–1.2)
Prothrombin Time: 14.7 s (ref 11.4–15.2)

## 2023-06-16 LAB — COMPREHENSIVE METABOLIC PANEL
ALT: 30 U/L (ref 0–44)
AST: 36 U/L (ref 15–41)
Albumin: 3.4 g/dL — ABNORMAL LOW (ref 3.5–5.0)
Alkaline Phosphatase: 47 U/L (ref 38–126)
Anion gap: 13 (ref 5–15)
BUN: 33 mg/dL — ABNORMAL HIGH (ref 8–23)
CO2: 17 mmol/L — ABNORMAL LOW (ref 22–32)
Calcium: 9.2 mg/dL (ref 8.9–10.3)
Chloride: 107 mmol/L (ref 98–111)
Creatinine, Ser: 1.99 mg/dL — ABNORMAL HIGH (ref 0.61–1.24)
GFR, Estimated: 32 mL/min — ABNORMAL LOW (ref >60)
Glucose, Bld: 127 mg/dL — ABNORMAL HIGH (ref 70–99)
Potassium: 4.2 mmol/L (ref 3.5–5.1)
Sodium: 137 mmol/L (ref 135–145)
Total Bilirubin: 0.9 mg/dL (ref 0.0–1.2)
Total Protein: 7.4 g/dL (ref 6.5–8.1)

## 2023-06-16 LAB — ETHANOL: Alcohol, Ethyl (B): <10 mg/dL (ref <10)

## 2023-06-16 LAB — RAPID URINE DRUG SCREEN, HOSP PERFORMED
Amphetamines: NOT DETECTED
Barbiturates: NOT DETECTED
Benzodiazepines: NOT DETECTED
Cocaine: NOT DETECTED
Opiates: NOT DETECTED
Tetrahydrocannabinol: NOT DETECTED

## 2023-06-16 LAB — APTT: aPTT: 28 s (ref 24–36)

## 2023-06-16 LAB — CBG MONITORING, ED: Glucose-Capillary: 129 mg/dL — ABNORMAL HIGH (ref 70–99)

## 2023-06-16 LAB — MRSA NEXT GEN BY PCR, NASAL: MRSA by PCR Next Gen: NOT DETECTED

## 2023-06-16 MED ORDER — IOHEXOL 350 MG/ML SOLN
60.0000 mL | Freq: Once | INTRAVENOUS | Status: AC | PRN
  Administered 2023-06-16: 60 mL via INTRAVENOUS

## 2023-06-16 MED ORDER — CHLORHEXIDINE GLUCONATE CLOTH 2 % EX PADS
6.0000 | MEDICATED_PAD | Freq: Every day | CUTANEOUS | Status: DC
  Administered 2023-06-16 – 2023-06-19 (×4): 6 via TOPICAL

## 2023-06-16 MED ORDER — ACETAMINOPHEN 160 MG/5ML PO SOLN
650.0000 mg | ORAL | Status: DC | PRN
  Filled 2023-06-16: qty 20.3

## 2023-06-16 MED ORDER — TENECTEPLASE FOR STROKE
0.2500 mg/kg | PACK | Freq: Once | INTRAVENOUS | Status: AC
  Administered 2023-06-16: 25 mg via INTRAVENOUS
  Filled 2023-06-16: qty 10

## 2023-06-16 MED ORDER — VITAMIN D 25 MCG (1000 UNIT) PO TABS
2000.0000 [IU] | ORAL_TABLET | Freq: Every day | ORAL | Status: DC
  Administered 2023-06-17 – 2023-06-21 (×5): 2000 [IU] via ORAL
  Filled 2023-06-16 (×5): qty 2

## 2023-06-16 MED ORDER — ACETAMINOPHEN 325 MG PO TABS
650.0000 mg | ORAL_TABLET | ORAL | Status: DC | PRN
  Administered 2023-06-17 – 2023-06-18 (×3): 650 mg via ORAL
  Filled 2023-06-16 (×3): qty 2

## 2023-06-16 MED ORDER — SENNOSIDES-DOCUSATE SODIUM 8.6-50 MG PO TABS: 1.0000 | ORAL_TABLET | Freq: Every evening | ORAL | Status: DC | PRN

## 2023-06-16 MED ORDER — ORAL CARE MOUTH RINSE: 15.0000 mL | OROMUCOSAL | Status: DC | PRN

## 2023-06-16 MED ORDER — STROKE: EARLY STAGES OF RECOVERY BOOK
Freq: Once | Status: AC
  Filled 2023-06-16: qty 1

## 2023-06-16 MED ORDER — ROSUVASTATIN CALCIUM 20 MG PO TABS
40.0000 mg | ORAL_TABLET | Freq: Every day | ORAL | Status: DC
  Administered 2023-06-17 – 2023-06-21 (×5): 40 mg via ORAL
  Filled 2023-06-16 (×5): qty 2

## 2023-06-16 MED ORDER — VITAMIN D 50 MCG (2000 UT) PO TABS: 2000.0000 [IU] | ORAL_TABLET | Freq: Every day | ORAL | Status: DC

## 2023-06-16 MED ORDER — SPIRONOLACTONE 12.5 MG HALF TABLET: 12.5000 mg | ORAL_TABLET | Freq: Every day | ORAL | Status: DC

## 2023-06-16 MED ORDER — CARVEDILOL 3.125 MG PO TABS: 3.1250 mg | ORAL_TABLET | Freq: Two times a day (BID) | ORAL | Status: DC

## 2023-06-16 MED ORDER — ACETAMINOPHEN 650 MG RE SUPP: 650.0000 mg | RECTAL | Status: DC | PRN

## 2023-06-16 MED ORDER — EZETIMIBE 10 MG PO TABS
10.0000 mg | ORAL_TABLET | Freq: Every day | ORAL | Status: DC
  Administered 2023-06-17 – 2023-06-21 (×5): 10 mg via ORAL
  Filled 2023-06-16 (×5): qty 1

## 2023-06-16 MED ORDER — VITAMIN B COMPLEX-C PO CAPS: ORAL_CAPSULE | Freq: Every day | ORAL | Status: DC

## 2023-06-16 MED ORDER — INSULIN ASPART 100 UNIT/ML IJ SOLN
0.0000 [IU] | Freq: Three times a day (TID) | INTRAMUSCULAR | Status: DC
  Administered 2023-06-17 (×2): 3 [IU] via SUBCUTANEOUS
  Administered 2023-06-18: 2 [IU] via SUBCUTANEOUS
  Administered 2023-06-19: 1 [IU] via SUBCUTANEOUS
  Administered 2023-06-19 – 2023-06-20 (×3): 2 [IU] via SUBCUTANEOUS

## 2023-06-16 MED ORDER — INSULIN ASPART 100 UNIT/ML IJ SOLN: 0.0000 [IU] | Freq: Every day | INTRAMUSCULAR | Status: DC

## 2023-06-16 MED ORDER — SODIUM CHLORIDE 0.9 % IV SOLN: INTRAVENOUS | Status: DC

## 2023-06-16 NOTE — Progress Notes (Signed)
 PHARMACIST CODE STROKE RESPONSE  Notified to mix TNK at 1144 by Dr. Otelia Limes TNK preparation completed at 1146  TNK dose = 25 mg IV over 5 seconds  Issues/delays encountered (if applicable): awaiting second scan to complete for RN to enter and administer at 1149  Robert Horn 06/16/23 11:30 AM

## 2023-06-16 NOTE — H&P (Signed)
 NEUROLOGY H&P   Date of service: June 16, 2023 Patient Name: Robert Horn MRN:  [id number] DOB:  21-Jul-1933 Chief Complaint: Patient is unable to provide chief complaint due to aphasia Requesting Provider: Gerhard Munch, MD  History of Present Illness  Robert Horn is an 89 y.o. male with hx of type 2 diabetes mellitus, essential hypertension, hyperlipidemia, coronary artery disease, combined CHF, PAD, CKD stage IIIb, colon cancer s/p colectomy, remote smoker (quit 30 years ago), right partial foot amputation approximately 3 years ago with ongoing wound complications and recent bone shaving in November of 2024 at [facility], and recent influenza infection last week who presents to the ED via EMS as a Code Stroke after he was noted to be aphasic at work following an unwitnessed fall. EMS did not know if the fall was onto a hard surface or if there was a hard impact, but there was no evidence for bleeding or bruising on scene. No seizure activity witnessed. EMS states that the patient was unable to understand commands or questions and limited speech output was garbled and nonsensical on scene. LKN was 1000. He fell at about 1025.   LKW: 10:00 AM seen by secretary/staff at work Modified rankin score: 0-Completely asymptomatic and back to baseline post- stroke IV Thrombolysis: Yes, given at 11:49 after obtaining consent from wife at patient bedside ([name]: [phone]) EVT:  No, vessel imaging reviewed by attending neurologist and neuroradiologist without evidence of LVO  NIHSS components Score: Comment  1a Level of Conscious 0[x]  1[]  2[]  3[]      1b LOC Questions 0[]  1[]  2[x]       1c LOC Commands 0[]  1[]  2[x]       2 Best Gaze 0[x]  1[]  2[]       3 Visual 0[x]  1[]  2[]  3[]      4 Facial Palsy 0[x]  1[]  2[]  3[]      5a Motor Arm - left 0[x]  1[]  2[]  3[]  4[]  UN[]    5b Motor Arm - Right 0[x]  1[]  2[]  3[]  4[]  UN[]    6a Motor Leg - Left 0[x]  1[]  2[]  3[]  4[]  UN[]    6b Motor Leg - Right 0[x]  1[]  2[]  3[]   4[]  UN[]    7 Limb Ataxia 0[x]  1[]  2[]  3[]  UN[]     8 Sensory 0[x]  1[]  2[]  UN[]      9 Best Language 0[]  1[]  2[x]  3[]      10 Dysarthria 0[x]  1[]  2[]  UN[]      11 Extinct. and Inattention 0[x]  1[]  2[]       TOTAL: 6    ROS  Unable to ascertain due to patient with global aphasia on arrival.   Past History   Past Medical History:  Diagnosis Date   Anemia    Anxiety    situational- surgery   Asthma    as a child   Cancer (HCC) 2019   colon- colectomy    CHF (congestive heart failure) (HCC)    Chronic kidney disease    followed by Dr. Nehemiah Settle   Coronary artery disease    Diabetes mellitus without complication (HCC)    Type II   Dyslipidemia 10/27/2015   Dyspnea    w/ exertion    Elevated PSA 10/27/2015   Erectile dysfunction 10/27/2015   Heart murmur    Hypertension 10/27/2015   Hypogonadism male 10/27/2015   Neuropathy    Obesity 10/27/2015   Peripheral vascular disease (HCC)    Pneumonia 10/27/2015   pt states was 1982   Rotator cuff tear 10/27/2015   Past Surgical History:  Procedure Laterality Date   ABDOMINAL AORTOGRAM N/A 02/01/2019   Procedure: ABDOMINAL AORTOGRAM;  Surgeon: Cephus Shelling, MD;  Location: [facility] INVASIVE CV LAB;  Service: Cardiovascular;  Laterality: N/A;   AMPUTATION Right 02/03/2019   Procedure: RIGHT FOOT 1ST RAY AMPUTATION;  Surgeon: Nadara Mustard, MD;  Location: [facility] OR;  Service: Orthopedics;  Laterality: Right;   AMPUTATION Right 08/02/2019   Procedure: RIGHT 2ND TOE AMPUTATION;  Surgeon: Nadara Mustard, MD;  Location: [facility] OR;  Service: Orthopedics;  Laterality: Right;   AMPUTATION Right 11/08/2020   Procedure: RIGHT TRANSMETATARSAL AMPUTATION;  Surgeon: Nadara Mustard, MD;  Location: [facility] OR;  Service: Orthopedics;  Laterality: Right;   APPLICATION OF WOUND VAC  11/08/2020   Procedure: APPLICATION OF WOUND VAC;  Surgeon: Nadara Mustard, MD;  Location: [facility] OR;  Service: Orthopedics;;   CARDIAC CATHETERIZATION N/A 11/14/2015   Procedure: Left Heart  Cath and Coronary Angiography;  Surgeon: Lyn Records, MD;  Location: [facility] INVASIVE CV LAB;  Service: Cardiovascular;  Laterality: N/A;   COLONOSCOPY     LAPAROSCOPIC PARTIAL COLECTOMY  12/15/2017   LAPAROSCOPIC PARTIAL COLECTOMY (N/A Abdomen)   LAPAROSCOPIC PARTIAL COLECTOMY N/A 12/15/2017   Procedure: LAPAROSCOPIC PARTIAL COLECTOMY;  Surgeon: Harriette Bouillon, MD;  Location: [facility] OR;  Service: General;  Laterality: N/A;   LOWER EXTREMITY ANGIOGRAPHY Right 02/01/2019   Procedure: LOWER EXTREMITY ANGIOGRAPHY;  Surgeon: Cephus Shelling, MD;  Location: [facility] INVASIVE CV LAB;  Service: Cardiovascular;  Laterality: Right;   PERIPHERAL VASCULAR INTERVENTION Right 02/01/2019   Procedure: PERIPHERAL VASCULAR INTERVENTION;  Surgeon: Cephus Shelling, MD;  Location: [facility] INVASIVE CV LAB;  Service: Cardiovascular;  Laterality: Right;  SFA   RIGHT HEART CATH AND CORONARY ANGIOGRAPHY N/A 03/11/2023   Procedure: RIGHT HEART CATH AND CORONARY ANGIOGRAPHY;  Surgeon: Kathleene Hazel, MD;  Location: [facility] INVASIVE CV LAB;  Service: Cardiovascular;  Laterality: N/A;   TEE WITHOUT CARDIOVERSION N/A 12/15/2016   Procedure: TRANSESOPHAGEAL ECHOCARDIOGRAM (TEE);  Surgeon: Tonny Bollman, MD;  Location: [facility] OR;  Service: Open Heart Surgery;  Laterality: N/A;   TRANSCATHETER AORTIC VALVE REPLACEMENT, TRANSFEMORAL N/A 12/15/2016   Procedure: TRANSCATHETER AORTIC VALVE REPLACEMENT, TRANSFEMORAL;  Surgeon: Tonny Bollman, MD;  Location: [facility] OR;  Service: Open Heart Surgery;  Laterality: N/A;   Family History: Family History  Problem Relation Age of Onset   Diabetes Mother    Heart disease Mother    Pulmonary embolism Father    Social History  reports that he has quit smoking. His smoking use included cigarettes. He has never been exposed to tobacco smoke. He has never used smokeless tobacco. He reports current alcohol use. He reports that he does not use drugs.  Allergies  Allergen Reactions   Ticagrelor Other  (See Comments)    Unknown    Medications  No current facility-administered medications for this encounter.  Current Outpatient Medications:    acetaminophen (TYLENOL) 500 MG tablet, Take 1,000 mg by mouth as needed for moderate pain (pain score 4-6) or mild pain (pain score 1-3)., Disp: , Rfl:    aspirin 81 MG EC tablet, Take 81 mg by mouth daily. Swallow whole., Disp: , Rfl:    carvedilol (COREG) 3.125 MG tablet, Take 1 tablet (3.125 mg total) by mouth 2 (two) times daily with a meal., Disp: 60 tablet, Rfl: 11   Cholecalciferol (VITAMIN D) 50 MCG (2000 UT) tablet, Take 2,000 Units by mouth daily., Disp: , Rfl:    clopidogrel (PLAVIX) 75 MG tablet, Take 1  tablet (75 mg total) by mouth daily., Disp: 90 tablet, Rfl: 3   diclofenac Sodium (VOLTAREN) 1 % GEL, Apply 1 Application topically as needed (pain)., Disp: , Rfl:    ezetimibe (ZETIA) 10 MG tablet, Take 1 tablet (10 mg total) by mouth daily., Disp: 90 tablet, Rfl: 3   ferrous sulfate 325 (65 FE) MG tablet, Take 325 mg by mouth daily with breakfast., Disp: , Rfl:    furosemide (LASIX) 80 MG tablet, Take 80 mg by mouth Daily alternating 40 mg every other day., Disp: 45 tablet, Rfl: 5   icosapent Ethyl (VASCEPA) 1 g capsule, Take 2 capsules (2 g total) by mouth 2 (two) times daily., Disp: 120 capsule, Rfl: 1   JARDIANCE 25 MG TABS tablet, Take 25 mg by mouth daily., Disp: , Rfl:    oxyCODONE-acetaminophen (PERCOCET/ROXICET) 5-325 MG tablet, Take 1 tablet by mouth every 4 (four) hours as needed for severe pain., Disp: 30 tablet, Rfl: 0   rosuvastatin (CRESTOR) 40 MG tablet, Take 1 tablet (40 mg total) by mouth daily., Disp: 30 tablet, Rfl: 5   sacubitril-valsartan (ENTRESTO) 49-51 MG, Take 1 tablet by mouth 2 (two) times daily., Disp: 60 tablet, Rfl: 11   Semaglutide,0.25 or 0.5MG /DOS, (OZEMPIC, 0.25 OR 0.5 MG/DOSE,) 2 MG/1.5ML SOPN, Inject 0.5 mg into the skin once a week., Disp: , Rfl:    spironolactone (ALDACTONE) 25 MG tablet, Take 0.5  tablets (12.5 mg total) by mouth daily., Disp: 45 tablet, Rfl: 3   VITAMIN B COMPLEX-C PO, Take 1 tablet by mouth daily., Disp: , Rfl:   Vitals   BP (!) 147/105   Pulse 96   Temp 97.7 F (36.5 C)   Resp 20   Wt 98.6 kg   SpO2 100%   BMI 29.48 kg/m    Physical Exam   Constitutional: Appears well-developed and well-nourished.  Psych: Patient calm, unable to cooperate throughout due to aphasia.  Eyes: No scleral injection.  HENT: No OP obstruction. Cervical collar in place on arrival via EMS. Head: Normocephalic.  Cardiovascular: Bradycardia on EMS monitor with PVCs Respiratory: Effort normal, non-labored breathing on room air GI: Soft.  No distension. There is no tenderness.  Skin: WDI.  Ext: Right distal foot amputation noted.   Neurologic Examination   Mental Status: Patient is awake and alert but globally aphasic.  He is unable to answer orientation questions but answers "yes, no, I don't know" to each examiner question.  He is able to mimic a pantomimed command for extremity elevation assessment but unable to complete confrontational strength testing. Patient is unable to give a clear or coherent history.  Cranial Nerves: II: Blinks to threat throughout. Pupils are equal, round, and reactive to light.   III,IV, VI: EOMI without ptosis or diplopia.  V: Reacts to touch on bilateral face VII: Face appears symmetric resting and with movement VIII: Hearing is intact to voice X: Phonation intact [id number] collar in place XII: Does not protrude tongue to command Motor: Tone is normal. Bulk is normal. Confrontational strength testing limited as patient is unable to follow commands.  He is able to elevate each extremity antigravity without vertical drift with constant coaching and pantomiming.  Sensory: Patient grimaces to light pinch at the level of bilateral thighs and upper extremities.  Does not grimace to pinch at bilateral lower extremities distal to the knees.  Reflexes:  Normoactive x 4.  Cerebellar: Patient does not participate, unable to follow instructions Gait: Deferred  Labs/Imaging/Neurodiagnostic studies   CBC: No  results for input(s): "WBC", "NEUTROABS", "HGB", "HCT", "MCV", "PLT" in the last 168 hours.  Basic Metabolic Panel:  Lab Results  Component Value Date   NA 142 04/05/2023   K 4.5 04/05/2023   CO2 23 04/05/2023   GLUCOSE 95 04/05/2023   BUN 32 (H) 04/05/2023   CREATININE 1.72 (H) 04/05/2023   CALCIUM 9.5 04/05/2023   GFRNONAA 35 (L) 03/26/2023   GFRAA 49 (L) 08/02/2019   Lipid Panel:  Lab Results  Component Value Date   LDLCALC 107 (H) 05/13/2021   HgbA1c:  Lab Results  Component Value Date   HGBA1C 6.4 (H) 03/10/2023   Urine Drug Screen: No results found for: "LABOPIA", "COCAINSCRNUR", "LABBENZ", "AMPHETMU", "THCU", "LABBARB"  Alcohol Level No results found for: "ETH" INR  Lab Results  Component Value Date   INR 1.0 03/16/2021   APTT  Lab Results  Component Value Date   APTT 31 03/16/2021   Echocardiogram 03/09/23: 1. Left ventricular ejection fraction, by estimation, is 25 to 30%. Left  ventricular ejection fraction by 3D volume is 31 %. The left ventricle has  severely decreased function. The left ventricle demonstrates global  hypokinesis. The left ventricular  internal cavity size was mildly dilated. Left ventricular diastolic  parameters are consistent with Grade III diastolic dysfunction  (restrictive). There is the interventricular septum is flattened in  systole and diastole, consistent with right ventricular   pressure and volume overload.   2. Right ventricular systolic function is low normal. The right  ventricular size is moderately enlarged. Mildly increased right  ventricular wall thickness.   3. Left atrial size was moderately dilated.   4. Right atrial size was moderately dilated.   5. There is no evidence of cardiac tamponade.   6. The mitral valve is degenerative. Mild mitral valve  regurgitation.   7. The aortic valve has been repaired/replaced. Aortic valve  regurgitation is not visualized. There is a 26 mm Edwards Sapien  prosthetic (TAVR) valve present in the aortic position. Procedure Date:  12/15/2016. Aortic valve mean gradient measures 14.3  mmHg. Aortic valve Vmax measures 2.58 m/s. Aortic valve acceleration time  measures 67 msec.   8. Aortic dilatation noted. There is dilatation of the ascending aorta,  measuring 38 mm. There is dilatation of the ascending aorta, measuring 38  mm.   Comparison(s): Prior images reviewed side by side. The left ventricular  function is significantly worse. The left ventricular diastolic function  is significantly worse. TAVR gradients are lower due to reduction in LV  function.    ASSESSMENT  Garrell Flagg is an 89 y.o. male with a PMHx of DM2, HTN, HLD, CAD, PAD, severe aortic stenosis s/p TAVR 2018, combined CHF, CKD IIIb, colon cancer s/p colectomy, remote smoker, right partial foot amputation with wound complications s/p bone shaving 01/2023 who presented as a Code Stroke to the ED 06/16/23 via EMS for evaluation of aphasia after an unwitnessed fall this morning at work.  - Examination reveals global aphasia. NIHSS of 6 on arrival.  - CT head: No evidence of acute intracranial abnormality. ASPECTS of 10. Moderate to severe chronic small vessel ischemic disease. Interval small chronic left MCA infarct. - After comprehensive review of possible contraindications with his wife, he has no absolute contraindications to TNK administration. Patient is a TNK candidate. Discussed extensively the risks/benefits of TNK treatment vs. no treatment with the patient's wife, including risks of hemorrhage and death with TNK administration versus worse overall outcomes on average in patients within the  IV thrombolytic time window who are not administered TNK. The patient's aphasia precludes meaningful medical decision making on his part at this  time. Overall benefits of TNK regarding long-term prognosis are felt to outweigh risks. The patient's wife expressed understanding and wish to proceed with TNK.  - CTA of head and neck: No large vessel occlusion. Multifocal atherosclerosis. Moderate stenosis at the posterior genu of the left cavernous ICA. Additional mild-to-moderate stenosis at the anterior genu of the right cavernous ICA and right supraclinoid ICA. Focal moderate stenosis of a proximal M2 inferior division branch of the right MCA. Aortic Atherosclerosis  - CT neck: No acute fracture or traumatic malalignment of the cervical spine. Degenerative changes. 2 cm left thyroid nodule; recommend nonemergent thyroid ultrasound for further evaluation.  - EKG: Atrial fibrillation; Paired ventricular premature complexes; Aberrant conduction of SV complex(es); Anterior infarct, old; Prolonged QT interval  Plan: Acute Ischemic Stroke History of TIA without residual deficit  Acuity: Acute Current Suspected Etiology: work up pending  Continue Evaluation:  -Hold Aspirin until 24 hour post tPA neuroimaging is stable and without evidence of bleeding -Continue statin -Blood pressure control, goal of SYS < 180/105 for 24 hours post TNK -MRI/A1C/Lipid panel. -Hyperglycemia management per SSI to maintain glucose 140-180mg /dL. -PT/OT/ST therapies and recommendations when able -Has had an echocardiogram within the past 3 months. No need to repeat.   RESP -Recent influenza infection (06/11/2023) -maintain SpO2 > 94% -supplemental oxygen as needed  CV -Essential (primary) hypertension -Aggressive BP control, goal SBP < 180/105 -PRN labetalol, cleviprex as needed -Atrial fibrillation on EKG here in the ED, but no history of atrial fibrillation. May need to be started on anticoagulation if there is no contraindication 24 hours after TNK  Chronic combined heart failure  - Left ventricular ejection fraction was 25 to 30% on his December TTE -  Cards Consult as needed  Hyperlipidemia, unspecified  - Statin for goal LDL < 70  HEME AM CBC -Monitor -Transfuse for hgb < 7 -Bleeding precautions 24 hours post-TNK   ENDO Type 2 diabetes mellitus w/o complications. -SSI -Start oral meds -goal HgbA1c < 7%  GI/GU CKD Stage 3 (GFR 30-59) -Gentle hydration -avoid nephrotoxic agents -renal consult as needed  Fluid/Electrolyte Disorders AM CMP -Monitor electrolytes, replete as needed -Trend  Other Medical: CTA shows incidentally imaged wall thickening of the upper thoracic esophagus. Adjacent mildly enlarged upper paratracheal lymph node. Recommend correlation with history of esophagitis. Consider dedicated CT chest for further evaluation.  ID Monitor fever curve and WBC  Prophylaxis DVT:  SCDs GI: PPI Bowel: Docusate / Senna  Diet: NPO until cleared by speech  Code Status: His wife states that she would like him to be Full Code  THE FOLLOWING WERE PRESENT ON ADMISSION: CNS -  Acute Ischemic Stroke Respiratory - Recent influenza infection (positive 06/11/23) Cardiovascular - Chronic Combined CHF, Cardiac Arrhythmia Renal -  CKD stage IIIb Cancer - Colon cancer s/p colectomy Trauma - Unwitnessed fall PTA   Assisting with this evaluation: Lanae Boast, AGACNP-BC [facility] Pager: [phone]  Electronically signed: Dr. Caryl Pina

## 2023-06-16 NOTE — ED Provider Notes (Signed)
  EMERGENCY DEPARTMENT AT [facility] Provider Note   CSN: [id number] Arrival date & time: 06/16/23  1121  An emergency department physician performed an initial assessment on this suspected stroke patient at 1123.  History  Chief Complaint  Patient presents with   Code Stroke    Robert Horn is a 89 y.o. male with past medical history significant for heart failure, status post aortic valve replacement, hypertension, hyperlipidemia's, aortic stenosis, CKD, anemia, diabetes, previous TIA who presents with concern for new code stroke.  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  No previous traumatic head injury.  HPI     Home Medications Prior to Admission medications   Medication Sig Start Date End Date Taking? Authorizing Provider  acetaminophen (TYLENOL) 500 MG tablet Take 1,000 mg by mouth as needed for moderate pain (pain score 4-6) or mild pain (pain score 1-3).    [provider]  aspirin 81 MG EC tablet Take 81 mg by mouth daily. Swallow whole.    [provider]  carvedilol (COREG) 3.125 MG tablet Take 1 tablet (3.125 mg total) by mouth 2 (two) times daily with a meal. 05/24/23   Alver Sorrow, NP  Cholecalciferol (VITAMIN D) 50 MCG (2000 UT) tablet Take 2,000 Units by mouth daily.    [provider]  clopidogrel (PLAVIX) 75 MG tablet Take 1 tablet (75 mg total) by mouth daily. 05/20/23   Alver Sorrow, NP  diclofenac Sodium (VOLTAREN) 1 % GEL Apply 1 Application topically as needed (pain). 03/03/22   [provider]  ezetimibe (ZETIA) 10 MG tablet Take 1 tablet (10 mg total) by mouth daily. 05/31/23   Chilton Si, MD  ferrous sulfate 325 (65 FE) MG tablet Take 325 mg by mouth daily with breakfast.     [provider]  furosemide (LASIX) 80 MG tablet Take 80 mg by mouth Daily alternating 40 mg every other day. 03/29/23   Andrey Farmer, PA-C  icosapent Ethyl (VASCEPA) 1 g capsule Take 2 capsules (2 g total) by mouth 2 (two) times daily. 05/13/23   Alver Sorrow, NP  JARDIANCE 25 MG TABS tablet Take 25 mg by mouth daily. 12/06/19   [provider]  oxyCODONE-acetaminophen (PERCOCET/ROXICET) 5-325 MG tablet Take 1 tablet by mouth every 4 (four) hours as needed for severe pain. 05/26/22   Nadara Mustard, MD  rosuvastatin (CRESTOR) 40 MG tablet Take 1 tablet (40 mg total) by mouth daily. 05/15/21   Ihor Austin, NP  sacubitril-valsartan (ENTRESTO) 49-51 MG Take 1 tablet by mouth 2 (two) times daily. 04/12/23   Alver Sorrow, NP  Semaglutide,0.25 or 0.5MG /DOS, (OZEMPIC, 0.25 OR 0.5 MG/DOSE,) 2 MG/1.5ML SOPN Inject 0.5 mg into the skin once a week.    [provider]  spironolactone (ALDACTONE) 25 MG tablet Take 0.5 tablets (12.5 mg total) by mouth daily. 05/31/23 08/29/23  Chilton Si, MD  VITAMIN B COMPLEX-C PO Take 1 tablet by mouth daily.    [provider]      Allergies    Ticagrelor    Review of Systems   Review of Systems  All other systems reviewed and are negative.   Physical Exam Updated Vital Signs BP (!) 123/92   Pulse Marland Kitchen)  120   Temp 97.7 F (36.5 C)   Resp (!) 22   Wt 98.6 kg   SpO2 100%   BMI 29.48 kg/m  Physical Exam Vitals and nursing note reviewed.  Constitutional:      General: He is not in acute distress.    Appearance: Normal appearance.  HENT:     Head: Normocephalic and atraumatic.  Eyes:     General:        Right eye: No discharge.        Left eye: No discharge.  Cardiovascular:     Rate and Rhythm: Normal rate and regular rhythm.     Heart sounds: No murmur heard.    No friction rub. No gallop.  Pulmonary:     Effort: Pulmonary effort is normal.     Breath sounds: Normal breath sounds.  Abdominal:      General: Bowel sounds are normal.     Palpations: Abdomen is soft.  Musculoskeletal:     Comments: Moving all 4 limbs spontaneously, no signs of trauma throughout.  No significant tenderness to palpation on exam.  Skin:    General: Skin is warm and dry.     Capillary Refill: Capillary refill takes less than 2 seconds.  Neurological:     Mental Status: He is alert and oriented to person, place, and time.     Comments: Patient seeming to understand speech, can follow some simple commands, but with nonsensical speech himself.  Concerning for Broca aphasia, no focal unilateral weakness noted, no facial droop noted unable to assess pronator drift, Romberg, gait secondary to his communication difficulties  Psychiatric:        Mood and Affect: Mood normal.        Behavior: Behavior normal.     ED Results / Procedures / Treatments   Labs (all labs ordered are listed, but only abnormal results are displayed) Labs Reviewed  DIFFERENTIAL - Abnormal; Notable for the following components:      Result Value   Monocytes Absolute 1.1 (*)    All other components within normal limits  COMPREHENSIVE METABOLIC PANEL - Abnormal; Notable for the following components:   CO2 17 (*)    Glucose, Bld 127 (*)    BUN 33 (*)    Creatinine, Ser 1.99 (*)    Albumin 3.4 (*)    GFR, Estimated 32 (*)    All other components within normal limits  I-STAT CHEM 8, ED - Abnormal; Notable for the following components:   Chloride 112 (*)    BUN 35 (*)    Creatinine, Ser 2.20 (*)    Glucose, Bld 122 (*)    Calcium, Ion 1.12 (*)    TCO2 19 (*)    All other components within normal limits  CBG MONITORING, ED - Abnormal; Notable for the following components:   Glucose-Capillary 129 (*)    All other components within normal limits  ETHANOL  PROTIME-INR  APTT  CBC  RAPID URINE DRUG SCREEN, HOSP PERFORMED  URINALYSIS, ROUTINE W REFLEX MICROSCOPIC    EKG None  Radiology CT C-SPINE NO CHARGE Result Date:  06/16/2023 CLINICAL DATA:  Trauma, fall EXAM: CT CERVICAL SPINE WITHOUT CONTRAST TECHNIQUE: Multidetector CT imaging of the cervical spine was performed without intravenous contrast. Multiplanar CT image reconstructions were also generated. RADIATION DOSE REDUCTION: This exam was performed according to the departmental dose-optimization program which includes automated exposure control, adjustment of the mA and/or kV according to patient size and/or use of iterative  reconstruction technique. COMPARISON:  None Available. FINDINGS: Alignment: Straightening and slight reversal of the normal cervical lordosis. No listhesis. No facet subluxation or dislocation. Skull base and vertebrae: No compression fracture or displaced fracture in the cervical spine. Multiple small lucent foci along the endplates likely reflecting degenerative subcortical cystic change. Soft tissues and spinal canal: No prevertebral fluid or swelling. No visible canal hematoma. 2 cm left thyroid nodule. Disc levels: Moderate disc space narrowing at multiple levels. Degenerative endplate osteophytes most pronounced from C3-4 to C5-6. Disc osteophyte complexes at multiple levels. Mild spinal canal stenosis at C3-4 and C4-5. Facet arthrosis at multiple levels. Significant foraminal narrowing at multiple levels most pronounced at C5-6. Upper chest: Emphysema. Other: None IMPRESSION: No acute fracture or traumatic malalignment of the cervical spine. Degenerative changes as above. 2 cm left thyroid nodule. Recommend nonemergent thyroid ultrasound for further evaluation. Aortic Atherosclerosis (ICD10-I70.0) and Emphysema (ICD10-J43.9). Electronically Signed   By: Emily Filbert M.D.   On: 06/16/2023 12:37   CT ANGIO HEAD NECK W WO CM (CODE STROKE) Result Date: 06/16/2023 CLINICAL DATA:  Code stroke, expressive aphasia, fall. EXAM: CT ANGIOGRAPHY HEAD AND NECK WITH AND WITHOUT CONTRAST TECHNIQUE: Multidetector CT imaging of the head and neck was performed  using the standard protocol during bolus administration of intravenous contrast. Multiplanar CT image reconstructions and MIPs were obtained to evaluate the vascular anatomy. Carotid stenosis measurements (when applicable) are obtained utilizing NASCET criteria, using the distal internal carotid diameter as the denominator. RADIATION DOSE REDUCTION: This exam was performed according to the departmental dose-optimization program which includes automated exposure control, adjustment of the mA and/or kV according to patient size and/or use of iterative reconstruction technique. CONTRAST:  60mL OMNIPAQUE IOHEXOL 350 MG/ML SOLN COMPARISON:  Same day CT head.  CTA head and neck 03/16/2021. FINDINGS: CTA NECK FINDINGS Aortic arch: Common origin of the brachiocephalic and left common carotid arteries. Imaged portion shows no evidence of aneurysm or dissection. Mild atherosclerosis of the aortic arch. No significant stenosis of the major arch vessel origins. Pulmonary arteries: As permitted by contrast timing, there are no filling defects in the visualized pulmonary arteries. Subclavian arteries: The subclavian arteries are patent bilaterally. Atherosclerosis at the origin of the left subclavian artery without significant stenosis. Right carotid system: No evidence of dissection, stenosis (50% or greater), or occlusion. Retropharyngeal course of the distal common carotid artery and proximal cervical ICA. Left carotid system: No evidence of dissection, stenosis (50% or greater), or occlusion. Retropharyngeal course of the distal common carotid artery and proximal cervical ICA. Vertebral arteries: Codominant. No evidence of dissection or occlusion. There is distal tapering and mild narrowing involving the distal V4 segment of the right vertebral artery. No stenosis greater than 50%. Skeleton: No acute findings. Degenerative changes in the cervical spine. Other neck: The visualized airway is patent. No cervical lymphadenopathy.  Nodule in the left thyroid lobe measuring up to 2 cm. Upper chest: Emphysema. Dependent opacities likely reflecting atelectasis. There is mild thickening of the upper thoracic esophageal wall. Prominent superior paratracheal node near the tracheoesophageal groove measuring up to 1.2 cm in short axis. Review of the MIP images confirms the above findings CTA HEAD FINDINGS ANTERIOR CIRCULATION: The intracranial ICAs are patent bilaterally. Atherosclerosis of the carotid siphons. Moderate stenosis at the posterior genu of the left cavernous ICA. Additional mild-to-moderate stenosis of the anterior genu cavernous and supraclinoid right ICA. No proximal occlusion, aneurysm, or vascular malformation. MCAs: The middle cerebral arteries are patent bilaterally. There is moderate stenosis  of a proximal M2 inferior division branch of the right MCA without occlusion. Early branching of the left M1 segment. ACAs: The anterior cerebral arteries are patent bilaterally. POSTERIOR CIRCULATION: No significant stenosis, proximal occlusion, aneurysm, or vascular malformation. PCAs: Patent bilaterally.  Fetal origin of the left PCA. Pcomm: Visualized on the left. SCAs: The superior cerebellar arteries are patent bilaterally. Basilar artery: Patent AICAs: Not well visualized. PICAs: Patent Vertebral arteries: The intracranial vertebral arteries are patent. Venous sinuses: Not well visualized due to contrast timing. Anatomic variants: Fetal origin of the left PCA. Review of the MIP images confirms the above findings IMPRESSION: No large vessel occlusion. Multifocal atherosclerosis as above. Moderate stenosis at the posterior genu of the left cavernous ICA. Additional mild-to-moderate stenosis at the anterior genu of the right cavernous ICA and right supraclinoid ICA. Focal moderate stenosis of a proximal M2 inferior division branch of the right MCA. Wall thickening of the upper thoracic esophagus. Adjacent mildly enlarged upper  paratracheal lymph node. Recommend correlation with history of esophagitis. Consider dedicated CT chest for further evaluation. Left thyroid nodule measures up to 2 cm. Recommend correlation with nonemergent thyroid ultrasound. Aortic Atherosclerosis (ICD10-I70.0) and Emphysema (ICD10-J43.9). These results were communicated to Dr. Otelia Limes At 12:05 pm on 06/16/2023 by phone call. Electronically Signed   By: Emily Filbert M.D.   On: 06/16/2023 12:30   CT HEAD CODE STROKE WO CONTRAST Result Date: 06/16/2023 CLINICAL DATA:  Code stroke. Neuro deficit, acute, stroke suspected. Aphasia. Fall. EXAM: CT HEAD WITHOUT CONTRAST TECHNIQUE: Contiguous axial images were obtained from the base of the skull through the vertex without intravenous contrast. RADIATION DOSE REDUCTION: This exam was performed according to the departmental dose-optimization program which includes automated exposure control, adjustment of the mA and/or kV according to patient size and/or use of iterative reconstruction technique. COMPARISON:  Head CT 03/17/2021 and MRI 03/16/2021 FINDINGS: Brain: There is no evidence of an acute infarct, intracranial hemorrhage, mass, midline shift, or extra-axial fluid collection. Patchy to confluent hypodensities in the cerebral white matter bilaterally have mildly progressed and are nonspecific but compatible with moderate to severe chronic small vessel ischemic disease. A small cortical infarct at the anterior aspect of the left insula and frontal operculum is new but chronic in appearance. There is an unchanged small chronic infarct involving the left basal ganglia and corona radiata. There is mild cerebral atrophy. Vascular: Calcified atherosclerosis at the skull base. No hyperdense vessel. Skull: No acute fracture or suspicious lesion. Sinuses/Orbits: Visualized paranasal sinuses and mastoid air cells are clear. Unremarkable orbits. Other: None. ASPECTS (Alberta Stroke Program Early CT Score) - Ganglionic level  infarction (caudate, lentiform nuclei, internal capsule, insula, M1-M3 cortex): 7 - Supraganglionic infarction (M4-M6 cortex): 3 Total score (0-10 with 10 being normal): 10 These results were communicated to Dr. Otelia Limes at 11:48 am on 06/16/2023 by text page via the [facility] messaging system. IMPRESSION: 1. No evidence of acute intracranial abnormality. ASPECTS of 10. 2. Moderate to severe chronic small vessel ischemic disease. 3. Interval small chronic left MCA infarct. Electronically Signed   By: Sebastian Ache M.D.   On: 06/16/2023 11:51    Procedures .Critical Care  Performed by: Olene Floss, PA-C Authorized by: Olene Floss, PA-C   Critical care provider statement:    Critical care time (minutes):  35   Critical care was necessary to treat or prevent imminent or life-threatening deterioration of the following conditions:  CNS failure or compromise   Critical care was time spent personally by  me on the following activities:  Development of treatment plan with patient or surrogate, discussions with consultants, evaluation of patient's response to treatment, examination of patient, ordering and review of laboratory studies, ordering and review of radiographic studies, ordering and performing treatments and interventions, pulse oximetry, re-evaluation of patient's condition and review of old charts   Care discussed with: admitting provider       Medications Ordered in ED Medications  0.9 %  sodium chloride infusion (has no administration in time range)  tenecteplase (TNKASE) injection for Stroke 25 mg (25 mg Intravenous Given 06/16/23 1149)  iohexol (OMNIPAQUE) 350 MG/ML injection 60 mL (60 mLs Intravenous Contrast Given 06/16/23 1155)    ED Course/ Medical Decision Making/ A&P Clinical Course as of 06/16/23 1324  Wed Jun 16, 2023  1151 Getting TNK [CP]    Clinical Course User Index [CP] Olene Floss, PA-C                                 Medical Decision  Making Amount and/or Complexity of Data Reviewed Labs: ordered. Radiology: ordered.   This patient is a 89 y.o. male who presents to the ED for concern of stroke, this involves an extensive number of treatment options, and is a complaint that carries with it a high risk of complications and morbidity. The emergent differential diagnosis prior to evaluation includes, but is not limited to, hemorrhagic versus thrombotic stroke, seizure, hypoglycemia, electrolyte abnormality, intoxication, or other reasons for altered mental status, acute neurologic abnormality.. This is not an exhaustive differential.   Past Medical History / Co-morbidities / Social History: heart failure, status post aortic valve replacement, hypertension, hyperlipidemia's, aortic stenosis, CKD, anemia, diabetes, previous TIA  Additional history: Chart reviewed. Pertinent results include: Reviewed lab work, imaging from previous emergency department visits, outpatient cardiology visits  Physical Exam: Physical exam performed. The pertinent findings include: Moving all 4 limbs spontaneously, no signs of trauma throughout.  No significant tenderness to palpation on exam.  Patient seeming to understand speech, can follow some simple commands, but with nonsensical speech himself.  Concerning for Broca aphasia, no focal unilateral weakness noted, no facial droop noted unable to assess pronator drift, Romberg, gait secondary to his communication difficulties   On arrival with some tachycardia, pulse around 120 on most recent evaluation, mild tachypnea, respirations 22, he has mild diastolic hypertension, blood pressure 123/92 most recently.  Lab Tests/Imaging studies: I ordered, and personally interpreted labs.  The pertinent results include: CBC unremarkable, CMP with bicarb deficit, CO2 17 but with no anion gap, his BUN is elevated at 33, creatinine 1.99, estimated GFR of 32.  PT/INR, APTT are normal, ethanol level undetectable.  I  independently interpreted CT C-spine, CT angio head and neck, CT code stroke which shows   Moderate stenosis at the  posterior genu of the left cavernous ICA. Additional  mild-to-moderate stenosis at the anterior genu of the right  cavernous ICA and right supraclinoid ICA. Focal moderate stenosis of  a proximal M2 inferior division branch of the right MCA.    I agree with the radiologist interpretation.   Cardiac Monitoring:  The patient was maintained on a cardiac monitor.  My attending physician Dr. Jeraldine Loots viewed and interpreted the cardiac monitored which showed an underlying rhythm of: afib. I agree with this interpretation.   Patient was seen and evaluated at the [facility], and taken emergently to CT scanner due to onset of symptoms  within 2 hours, he was treated acutely for stroke with TNK after consent obtained from family.  He will be admitted to the neuro ICU, neurologist overseeing his care, Dr. Otelia Limes.  Disposition: After consideration of the diagnostic results and the patients response to treatment, I feel that patient would benefit from hospital admission for acute stroke after receiving TNK.   I discussed this case with my attending physician Dr. Jeraldine Loots who cosigned this note including patient's presenting symptoms, physical exam, and planned diagnostics and interventions. Attending physician stated agreement with plan or made changes to plan which were implemented.    Final Clinical Impression(s) / ED Diagnoses Final diagnoses:  None    Rx / DC Orders ED Discharge Orders     None         [place] 06/16/23 1324    Gerhard Munch, MD 06/17/23 [phone]

## 2023-06-16 NOTE — Plan of Care (Signed)
 Dark red blood now in foley bag. Will obtain hemoglobin/hematocrit now and at 11 PM. Not an indication for TNK reversal at this time.   Electronically signed: Dr. Caryl Pina

## 2023-06-16 NOTE — ED Notes (Signed)
 I went in to room to assess pt for TNK assessment and noticed pt has dark red blood in the urine tubing and urine bag. When I first placed condom cath on pt and he urinated it was clear yellow urine in bag. I notified secretary to page Dr Otelia Limes for me

## 2023-06-16 NOTE — ED Notes (Signed)
 Notified Dr Otelia Limes of blood and pt's urine and obtained orders for blood work.

## 2023-06-16 NOTE — ED Notes (Signed)
 Notified Raymon Mutton, NP of pt urinating blood and was told to also notify Dr Otelia Limes. I'm still waiting a return call from Dr Otelia Limes

## 2023-06-16 NOTE — Code Documentation (Signed)
 Stroke Response Nurse Documentation Code Documentation  Robert Horn is a 89 y.o. male arriving to [facility]  via [facility] on 06/16/2023 with past medical hx of heart failure, TAVR, CKD, DM and colon cancer. On aspirin 81 mg daily and clopidogrel 75 mg daily. Code stroke was activated by EMS.   Patient from home where he was LKW at 1000  and now complaining of aphasia. His wife heard him fall at 1025, and he has had difficulty speaking since that time.  Stroke team at the bedside on patient arrival. Labs drawn and patient cleared for CT by Dr. Jeraldine Loots. Patient to CT with team. NIHSS 6, see documentation for details and code stroke times. Patient with disoriented, not following commands, and Global aphasia  on exam. The following imaging was completed:  CT Head and CTA. Patient is a candidate for IV Thrombolytic due to fixed neurological deficit; aphasia. Patient is not a candidate for IR due to LVO negative..   Care Plan: NIHSS and BP q 15 for two hours, then q 30 for 6 hours, then hourly for 16 hours. Keep NPO until passing Stroke Swallow screen. Keep BP < 180/105.Marland Kitchen   Process Delays Noted: Long discussion with family to clear for TNK.   Bedside handoff with ED RN Alena.    Argentina Donovan  Stroke Response RN

## 2023-06-16 NOTE — ED Triage Notes (Signed)
 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 Pt was last seen normal by co-works at 1000 today. Pt was found on floor at work at 1025. Pt was aphasic and not following commands. Pt arrived w/c-collar on.

## 2023-06-17 ENCOUNTER — Inpatient Hospital Stay (HOSPITAL_COMMUNITY)

## 2023-06-17 DIAGNOSIS — E785 Hyperlipidemia, unspecified: Secondary | ICD-10-CM

## 2023-06-17 DIAGNOSIS — I639 Cerebral infarction, unspecified: Secondary | ICD-10-CM

## 2023-06-17 DIAGNOSIS — I5082 Biventricular heart failure: Secondary | ICD-10-CM

## 2023-06-17 DIAGNOSIS — I480 Paroxysmal atrial fibrillation: Secondary | ICD-10-CM | POA: Diagnosis not present

## 2023-06-17 DIAGNOSIS — I739 Peripheral vascular disease, unspecified: Secondary | ICD-10-CM

## 2023-06-17 DIAGNOSIS — Z952 Presence of prosthetic heart valve: Secondary | ICD-10-CM

## 2023-06-17 DIAGNOSIS — I6389 Other cerebral infarction: Secondary | ICD-10-CM

## 2023-06-17 DIAGNOSIS — I63413 Cerebral infarction due to embolism of bilateral middle cerebral arteries: Secondary | ICD-10-CM | POA: Diagnosis not present

## 2023-06-17 DIAGNOSIS — I10 Essential (primary) hypertension: Secondary | ICD-10-CM | POA: Diagnosis not present

## 2023-06-17 LAB — BASIC METABOLIC PANEL
Anion gap: 6 (ref 5–15)
BUN: 31 mg/dL — ABNORMAL HIGH (ref 8–23)
CO2: 21 mmol/L — ABNORMAL LOW (ref 22–32)
Calcium: 8.6 mg/dL — ABNORMAL LOW (ref 8.9–10.3)
Chloride: 111 mmol/L (ref 98–111)
Creatinine, Ser: 1.91 mg/dL — ABNORMAL HIGH (ref 0.61–1.24)
GFR, Estimated: 34 mL/min — ABNORMAL LOW (ref >60)
Glucose, Bld: 95 mg/dL (ref 70–99)
Potassium: 4.6 mmol/L (ref 3.5–5.1)
Sodium: 138 mmol/L (ref 135–145)

## 2023-06-17 LAB — CBC
HCT: 34.7 % — ABNORMAL LOW (ref 39.0–52.0)
Hemoglobin: 11 g/dL — ABNORMAL LOW (ref 13.0–17.0)
MCH: 29.2 pg (ref 26.0–34.0)
MCHC: 31.7 g/dL (ref 30.0–36.0)
MCV: 92 fL (ref 80.0–100.0)
Platelets: UNDETERMINED 10*3/uL (ref 150–400)
RBC: 3.77 MIL/uL — ABNORMAL LOW (ref 4.22–5.81)
RDW: 14.9 % (ref 11.5–15.5)
WBC: 7.7 10*3/uL (ref 4.0–10.5)
nRBC: 0 % (ref 0.0–0.2)

## 2023-06-17 LAB — ECHOCARDIOGRAM COMPLETE
AR max vel: 1 cm2
AV Area VTI: 1.1 cm2
AV Area mean vel: 0.99 cm2
AV Mean grad: 12 mmHg
AV Peak grad: 20.1 mmHg
Ao pk vel: 2.24 m/s
Area-P 1/2: 2.02 cm2
Calc EF: 34.6 %
MV VTI: 1.6 cm2
S' Lateral: 5.7 cm
Single Plane A2C EF: 13.7 %
Single Plane A4C EF: 51.8 %
Weight: 3477.98 [oz_av]

## 2023-06-17 LAB — LIPID PANEL
Cholesterol: 126 mg/dL (ref 0–200)
HDL: 39 mg/dL — ABNORMAL LOW (ref >40)
LDL Cholesterol: 66 mg/dL (ref 0–99)
Total CHOL/HDL Ratio: 3.2 ratio
Triglycerides: 103 mg/dL (ref <150)
VLDL: 21 mg/dL (ref 0–40)

## 2023-06-17 LAB — GLUCOSE, CAPILLARY
Glucose-Capillary: 169 mg/dL — ABNORMAL HIGH (ref 70–99)
Glucose-Capillary: 102 mg/dL — ABNORMAL HIGH (ref 70–99)
Glucose-Capillary: 168 mg/dL — ABNORMAL HIGH (ref 70–99)
Glucose-Capillary: 92 mg/dL (ref 70–99)

## 2023-06-17 LAB — HEMOGLOBIN A1C
Hgb A1c MFr Bld: 6.7 % — ABNORMAL HIGH (ref 4.8–5.6)
Mean Plasma Glucose: 145.59 mg/dL

## 2023-06-17 MED ORDER — METOPROLOL TARTRATE 5 MG/5ML IV SOLN
2.5000 mg | INTRAVENOUS | Status: DC | PRN
  Filled 2023-06-17: qty 5

## 2023-06-17 MED ORDER — MIDODRINE HCL 5 MG PO TABS
5.0000 mg | ORAL_TABLET | Freq: Two times a day (BID) | ORAL | Status: DC
  Administered 2023-06-17 – 2023-06-18 (×3): 5 mg via ORAL
  Filled 2023-06-17 (×3): qty 1

## 2023-06-17 MED ORDER — APIXABAN 2.5 MG PO TABS
2.5000 mg | ORAL_TABLET | Freq: Two times a day (BID) | ORAL | Status: DC
  Administered 2023-06-17 – 2023-06-21 (×8): 2.5 mg via ORAL
  Filled 2023-06-17 (×8): qty 1

## 2023-06-17 MED ORDER — PERFLUTREN LIPID MICROSPHERE
1.0000 mL | INTRAVENOUS | Status: DC | PRN
  Administered 2023-06-17: 2 mL via INTRAVENOUS

## 2023-06-17 NOTE — Consult Note (Addendum)
 Cardiology Consultation   Patient ID: Robert Horn MRN: [id number]; DOB: [birth year]  Admit date: 06/16/2023 Date of Consult: 06/17/2023  PCP:  Renford Dills, MD   [facility] Providers Cardiologist:  Chilton Si, MD }    Patient Profile:   Robert Horn is a 89 y.o. male with a hx of HF, aortic stenosis s/p TAVR 2018, PAD s/p partial R foot amputations 02/24/2023, HTN, HLD, CKD, anemia, DM, previous TIA who is being seen 06/17/2023 for the evaluation of new onset Afib at the request of Dr. Stroke.  History of Present Illness:   Robert Horn is a 65 YOF with the above medical history.  Patient was last seen by cardiology at [facility] with Dr. Chilton Si for follow-up.  At that time patient was experiencing intermittent nocturnal dyspnea x 5 months which improved with positional changes.  Also reported exertional dyspnea and wheezing.  He had not taking additional Lasix as previously recommended with hospital discharge.  It was noted that patient had a recent disruption in his medication schedule due to a canceled foot surgery which led to a.  Without furosemide, which potentially contributed to recent health issues.  Echo 03/09/2023: LVEF 25 to 30%, LV severely decreased function, global hypokinesis, LV cavity mildly dilated, G3 DD, interventricular septum flattened in systole and diastole, C/W right ventricular pressure and volume overload.  RV systolic is low normal, RV size is moderately enlarged, mildly increased RV wall thickness.  L/R atrial size moderately dilated.  Mild MVR.  Aortic valve prosthesis present and in position without regurgitation.  Ascending aorta dilation of 38 mm.   03/11/2023: Cath with no evidence of CAD, Mild elevation right heart pressures, PCWP 13 mmHg, Cardiac output 4.3 L/min, CI 1.95. Recommendations: Continue medical management of non-ischemic cardiomyopathy. He has been adequately diuresed.   Presented to the ED 06/16/23 with concerns  for aphasia at work following an unwitnessed fall.  Code stroke and received TNK.  ED exam revealed tachycardia HR 120, mild tachypnea RR 22, BP 123/92.  CBC unremarkable, CMP with CO2 17 without anion gap, BUN 33, CR 1.99, GFR 32.  PT, INR, APTT, and  ethanol are normal. MRI shows left MCA infarct.  EKG: A-fib RVR, HR 115, PVCs prolonged QTC 522 (vs 04/25/22 NSR, HR 70, PVCs). K: 4.6   Cardiology was consulted for new onset A-fib. He is on ASA and Plavix for TIA 02/2021, s/p TAVR 2018, PAD s/p partial R foot amputations 02/24/2023. Patient is very hard of hearing, therefore most of the history is obtained from wife. Per wife, patient has not reported any CP, SOB, palpations, dizziness, lightheadedness, or syncope prior to this ED visit.  Patient has recently been congested from cold, which has resolved.  Wife denies any history of fall or bleeding risk. Risk Factors for afib include HTN, DM, CHF, Stroke/TIA, Chronic kidney disease, and Obesity. Denies any tobacco, ETOH, or drug use.    Past Medical History:  Diagnosis Date   Anemia    Anxiety    situational- surgery   Asthma    as a child   Cancer (HCC) 2019   colon- colectomy    CHF (congestive heart failure) (HCC)    Chronic kidney disease    followed by Dr. Nehemiah Settle   Coronary artery disease    Diabetes mellitus without complication (HCC)    Type II   Dyslipidemia 10/27/2015   Dyspnea    w/ exertion    Elevated PSA 10/27/2015   Erectile dysfunction 10/27/2015  Heart murmur    Hypertension 10/27/2015   Hypogonadism male 10/27/2015   Neuropathy    Obesity 10/27/2015   Peripheral vascular disease (HCC)    Pneumonia 10/27/2015   pt states was 1982   Rotator cuff tear 10/27/2015    Past Surgical History:  Procedure Laterality Date   ABDOMINAL AORTOGRAM N/A 02/01/2019   Procedure: ABDOMINAL AORTOGRAM;  Surgeon: Cephus Shelling, MD;  Location: [facility] INVASIVE CV LAB;  Service: Cardiovascular;  Laterality: N/A;   AMPUTATION Right  02/03/2019   Procedure: RIGHT FOOT 1ST RAY AMPUTATION;  Surgeon: Nadara Mustard, MD;  Location: [facility] OR;  Service: Orthopedics;  Laterality: Right;   AMPUTATION Right 08/02/2019   Procedure: RIGHT 2ND TOE AMPUTATION;  Surgeon: Nadara Mustard, MD;  Location: [facility] OR;  Service: Orthopedics;  Laterality: Right;   AMPUTATION Right 11/08/2020   Procedure: RIGHT TRANSMETATARSAL AMPUTATION;  Surgeon: Nadara Mustard, MD;  Location: [facility] OR;  Service: Orthopedics;  Laterality: Right;   APPLICATION OF WOUND VAC  11/08/2020   Procedure: APPLICATION OF WOUND VAC;  Surgeon: Nadara Mustard, MD;  Location: [facility] OR;  Service: Orthopedics;;   CARDIAC CATHETERIZATION N/A 11/14/2015   Procedure: Left Heart Cath and Coronary Angiography;  Surgeon: Lyn Records, MD;  Location: [facility] INVASIVE CV LAB;  Service: Cardiovascular;  Laterality: N/A;   COLONOSCOPY     LAPAROSCOPIC PARTIAL COLECTOMY  12/15/2017   LAPAROSCOPIC PARTIAL COLECTOMY (N/A Abdomen)   LAPAROSCOPIC PARTIAL COLECTOMY N/A 12/15/2017   Procedure: LAPAROSCOPIC PARTIAL COLECTOMY;  Surgeon: Harriette Bouillon, MD;  Location: [facility] OR;  Service: General;  Laterality: N/A;   LOWER EXTREMITY ANGIOGRAPHY Right 02/01/2019   Procedure: LOWER EXTREMITY ANGIOGRAPHY;  Surgeon: Cephus Shelling, MD;  Location: [facility] INVASIVE CV LAB;  Service: Cardiovascular;  Laterality: Right;   PERIPHERAL VASCULAR INTERVENTION Right 02/01/2019   Procedure: PERIPHERAL VASCULAR INTERVENTION;  Surgeon: Cephus Shelling, MD;  Location: [facility] INVASIVE CV LAB;  Service: Cardiovascular;  Laterality: Right;  SFA   RIGHT HEART CATH AND CORONARY ANGIOGRAPHY N/A 03/11/2023   Procedure: RIGHT HEART CATH AND CORONARY ANGIOGRAPHY;  Surgeon: Kathleene Hazel, MD;  Location: [facility] INVASIVE CV LAB;  Service: Cardiovascular;  Laterality: N/A;   TEE WITHOUT CARDIOVERSION N/A 12/15/2016   Procedure: TRANSESOPHAGEAL ECHOCARDIOGRAM (TEE);  Surgeon: Tonny Bollman, MD;  Location: [facility] OR;  Service: Open Heart Surgery;   Laterality: N/A;   TRANSCATHETER AORTIC VALVE REPLACEMENT, TRANSFEMORAL N/A 12/15/2016   Procedure: TRANSCATHETER AORTIC VALVE REPLACEMENT, TRANSFEMORAL;  Surgeon: Tonny Bollman, MD;  Location: [facility] OR;  Service: Open Heart Surgery;  Laterality: N/A;     Home Medications:  Prior to Admission medications   Medication Sig Start Date End Date Taking? Authorizing Provider  aspirin 81 MG EC tablet Take 81 mg by mouth daily. Swallow whole.   Yes [provider]  benzonatate (TESSALON) 100 MG capsule Take 100 mg by mouth 3 (three) times daily as needed for cough. 06/11/23  Yes [provider]  carvedilol (COREG) 3.125 MG tablet Take 1 tablet (3.125 mg total) by mouth 2 (two) times daily with a meal. 05/24/23  Yes Alver Sorrow, NP  Cholecalciferol (VITAMIN D) 50 MCG (2000 UT) tablet Take 2,000 Units by mouth daily.   Yes [provider]  clopidogrel (PLAVIX) 75 MG tablet Take 1 tablet (75 mg total) by mouth daily. 05/20/23  Yes Alver Sorrow, NP  diclofenac Sodium (VOLTAREN) 1 % GEL Apply 1 Application topically as needed (pain). 03/03/22  Yes [provider]  ezetimibe (ZETIA) 10 MG tablet Take 1 tablet (10 mg total) by mouth daily. 05/31/23  Yes Chilton Si, MD  ferrous sulfate 325 (65 FE) MG tablet Take 325 mg by mouth daily with breakfast.   Yes [provider]  furosemide (LASIX) 80 MG tablet Take 80 mg by mouth Daily alternating 40 mg every other day. Patient taking differently: Take 40-80 mg by mouth See admin instructions. Take 80 once daily one day then take 40 mg by mouth once daily the next day. Alternating doses. 03/29/23  Yes Andrey Farmer, PA-C  JARDIANCE 25 MG TABS tablet Take 25 mg by mouth daily. 12/06/19  Yes [provider]  omega-3 acid ethyl esters (LOVAZA) 1 g capsule Take 1 capsule by mouth daily. 05/28/23  Yes [provider]  rosuvastatin (CRESTOR) 40 MG tablet Take 1 tablet (40 mg total) by mouth  daily. Patient taking differently: Take 40 mg by mouth every evening. 05/15/21  Yes McCue, Shanda Bumps, NP  sacubitril-valsartan (ENTRESTO) 49-51 MG Take 1 tablet by mouth 2 (two) times daily. 04/12/23  Yes Alver Sorrow, NP  spironolactone (ALDACTONE) 25 MG tablet Take 0.5 tablets (12.5 mg total) by mouth daily. 05/31/23 08/29/23 Yes Chilton Si, MD  VITAMIN B COMPLEX-C PO Take 1 tablet by mouth daily.   Yes [provider]  azithromycin (ZITHROMAX) 250 MG tablet Take by mouth. Patient not taking: Reported on 06/16/2023 06/11/23   [provider]  oseltamivir (TAMIFLU) 30 MG capsule Take by mouth. Patient not taking: Reported on 06/16/2023 06/11/23   [provider]  oseltamivir (TAMIFLU) 75 MG capsule Take 75 mg by mouth once. Patient not taking: Reported on 06/16/2023 06/11/23   [provider]  oxyCODONE-acetaminophen (PERCOCET/ROXICET) 5-325 MG tablet Take 1 tablet by mouth every 4 (four) hours as needed for severe pain. Patient not taking: Reported on 06/16/2023 05/26/22   Nadara Mustard, MD  Semaglutide,0.25 or 0.5MG /DOS, (OZEMPIC, 0.25 OR 0.5 MG/DOSE,) 2 MG/1.5ML SOPN Inject 0.5 mg into the skin once a week. Patient not taking: Reported on 06/16/2023    [provider]    Inpatient Medications: Scheduled Meds:  Chlorhexidine Gluconate Cloth  6 each Topical Daily   cholecalciferol  2,000 Units Oral Daily   ezetimibe  10 mg Oral Daily   insulin aspart  0-15 Units Subcutaneous TID WC   insulin aspart  0-5 Units Subcutaneous QHS   midodrine  5 mg Oral BID WC   rosuvastatin  40 mg Oral Daily   Continuous Infusions:  PRN Meds: acetaminophen **OR** acetaminophen (TYLENOL) oral liquid 160 mg/5 mL **OR** acetaminophen, mouth rinse, senna-docusate  Allergies:    Allergies  Allergen Reactions   Ticagrelor Other (See Comments)    Unknown     Social History:   Social History   Socioeconomic History   Marital status: Married    Spouse name:  Alice   Number of children: 2   Years of education: Not on file   Highest education level: Master's degree (e.g., MA, MS, MEng, MEd, MSW, MBA)  Occupational History   Occupation: Part-time  Tobacco Use   Smoking status: Former    Types: Cigarettes    Passive exposure: Never   Smokeless tobacco: Never  Vaping Use   Vaping status: Never Used  Substance and Sexual Activity   Alcohol use: Yes    Comment: rarely   Drug use: No   Sexual activity: Not on file  Other Topics Concern   Not on file  Social History  Narrative   Not on file   Social Drivers of Health   Financial Resource Strain: Low Risk  (03/10/2023)   Overall Financial Resource Strain (CARDIA)    Difficulty of Paying Living Expenses: Not hard at all  Food Insecurity: No Food Insecurity (06/16/2023)   Hunger Vital Sign    Worried About Running Out of Food in the Last Year: Never true    Ran Out of Food in the Last Year: Never true  Transportation Needs: No Transportation Needs (06/16/2023)   PRAPARE - Administrator, Civil Service (Medical): No    Lack of Transportation (Non-Medical): No  Physical Activity: Not on file  Stress: Not on file  Social Connections: Moderately Integrated (06/16/2023)   Social Connection and Isolation Panel [NHANES]    Frequency of Communication with Friends and Family: More than three times a week    Frequency of Social Gatherings with Friends and Family: More than three times a week    Attends Religious Services: Never    Database administrator or Organizations: Yes    Attends Banker Meetings: Never    Marital Status: Married  Catering manager Violence: Not At Risk (06/16/2023)   Humiliation, Afraid, Rape, and Kick questionnaire    Fear of Current or Ex-Partner: No    Emotionally Abused: No    Physically Abused: No    Sexually Abused: No    Family History:    Family History  Problem Relation Age of Onset   Diabetes Mother    Heart disease Mother     Pulmonary embolism Father      ROS:  Please see the history of present illness.  All other ROS reviewed and negative.     Physical Exam/Data:   Vitals:   06/17/23 1400 06/17/23 1500 06/17/23 1559 06/17/23 1600  BP: 101/68 90/68  98/71  Pulse: 69 83  70  Resp: (!) 22 15  (!) 29  Temp:   98.4 F (36.9 C)   TempSrc:   Oral   SpO2: 100% 97%  96%  Weight:        Intake/Output Summary (Last 24 hours) at 06/17/2023 1630 Last data filed at 06/17/2023 1200 Gross per 24 hour  Intake 1165.69 ml  Output 1050 ml  Net 115.69 ml      06/16/2023   11:00 AM 05/31/2023   10:06 AM 04/05/2023    2:37 PM  Last 3 Weights  Weight (lbs) 217 lb 6 oz 219 lb 6.4 oz 216 lb 1.6 oz  Weight (kg) 98.6 kg 99.519 kg 98.022 kg     Body mass index is 29.48 kg/m.  General: Laying in bed, in no acute distress, Wife at bedside HEENT: normal Neck: no JVD Vascular: No carotid bruits; Distal pulses 2+ bilaterally Cardiac:  normal S1, S2; irregular irregular ; 2/6  systolic murmur  Lungs:  clear to auscultation bilaterally, no wheezing, rhonchi or rales  Abd: soft, nontender, no hepatomegaly  Ext: no edema Musculoskeletal:  No deformities, BUE and BLE strength normal and equal Skin: warm and dry  Neuro:  CNs 2-12 intact, no focal abnormalities noted Psych:  Normal affect   EKG:  The EKG was personally reviewed and demonstrates:  A-fib, HR 115, PVCs prolonged QTC 522 (vs 04/25/22 NSR, HR 70, PVCs)  Telemetry:  Telemetry was personally reviewed and demonstrates:  Afib, HR 90-110's, PVC increases to 130 -140's with movement   Relevant CV Studies:  Cardiac Cath 02/2023 No angiographic evidence of CAD  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  Mild elevation right heart pressures PCWP 13 mmHg Cardiac output 4.3 L/min CI 1.95   Recommendations: Continue medical management of non-ischemic cardiomyopathy. He has been adequately diuresed.   Laboratory Data:  High Sensitivity Troponin:  No  results for input(s): "TROPONINIHS" in the last 720 hours.   Chemistry Recent Labs  Lab 06/16/23 1128 06/16/23 1142 06/17/23 0358  NA 137 140 138  K 4.2 4.2 4.6  CL 107 112* 111  CO2 17*  --  21*  GLUCOSE 127* 122* 95  BUN 33* 35* 31*  CREATININE 1.99* 2.20* 1.91*  CALCIUM 9.2  --  8.6*  GFRNONAA 32*  --  34*  ANIONGAP 13  --  6    Recent Labs  Lab 06/16/23 1128  PROT 7.4  ALBUMIN 3.4*  AST 36  ALT 30  ALKPHOS 47  BILITOT 0.9   Lipids  Recent Labs  Lab 06/17/23 0358  CHOL 126  TRIG 103  HDL 39*  LDLCALC 66  CHOLHDL 3.2    Hematology Recent Labs  Lab 06/16/23 1128 06/16/23 1142 06/16/23 1616 06/16/23 2248 06/17/23 0358  WBC 6.4  --   --   --  7.7  RBC 4.50  --   --   --  3.77*  HGB 13.1   < > 11.5* 11.2* 11.0*  HCT 42.0   < > 37.3* 34.8* 34.7*  MCV 93.3  --   --   --  92.0  MCH 29.1  --   --   --  29.2  MCHC 31.2  --   --   --  31.7  RDW 14.8  --   --   --  14.9  PLT 234  --   --   --  PLATELET CLUMPS NOTED ON SMEAR, UNABLE TO ESTIMATE   < > = values in this interval not displayed.   Thyroid No results for input(s): "TSH", "FREET4" in the last 168 hours.  BNPNo results for input(s): "BNP", "PROBNP" in the last 168 hours.  DDimer No results for input(s): "DDIMER" in the last 168 hours.   Radiology/Studies:  CT HEAD WO CONTRAST ( ) Result Date: 06/17/2023 CLINICAL DATA:  Stroke, follow-up.  24 hour post TNK. EXAM: CT HEAD WITHOUT CONTRAST TECHNIQUE: Contiguous axial images were obtained from the base of the skull through the vertex without intravenous contrast. RADIATION DOSE REDUCTION: This exam was performed according to the departmental dose-optimization program which includes automated exposure control, adjustment of the mA and/or kV according to patient size and/or use of iterative reconstruction technique. COMPARISON:  MR head without contrast 06/17/2023. CT head without contrast 06/16/2023. FINDINGS: Brain: The acute nonhemorrhagic infarct  involving the left frontal operculum and anterior insular cortex is stable. No significant expansion of the infarct is present. Moderate atrophy and diffuse white matter disease is stable. Remote lacunar infarcts are present in the left lentiform nucleus right caudate head. The ventricles are proportionate to the degree of atrophy. No significant extraaxial fluid collection is present. The brainstem and cerebellum are within normal limits. Midline structures are within normal limits. Vascular: Atherosclerotic calcifications are present within the cavernous internal carotid arteries bilaterally. No hyperdense vessel is present. Skull: Calvarium is intact. No focal lytic or blastic lesions are present. No significant extracranial soft tissue lesion is present. Sinuses/Orbits: The paranasal sinuses and mastoid air cells are clear. The globes and orbits are within normal limits. IMPRESSION: 1. Stable acute nonhemorrhagic infarct involving the left  frontal operculum and anterior insular cortex. 2. Stable moderate atrophy and diffuse white matter disease. This likely reflects the sequela of chronic microvascular ischemia. 3. Remote lacunar infarcts of the left lentiform nucleus and right caudate head. Electronically Signed   By: Marin Roberts M.D.   On: 06/17/2023 15:50   MR BRAIN WO CONTRAST Result Date: 06/17/2023 CLINICAL DATA:  89 year old male code stroke presentation. Fall. Intracranial atherosclerosis. EXAM: MRI HEAD WITHOUT CONTRAST TECHNIQUE: Multiplanar, multiecho pulse sequences of the brain and surrounding structures were obtained without intravenous contrast. COMPARISON:  CT head, CTA head and neck yesterday. Brain MRI 03/16/2021. FINDINGS: Brain: Cortical, patchy and confluent restricted diffusion in the anterior or middle left MCA division territory. Anterior insula and operculum are affected, in an area of about 5 cm (series 7, image 61). T2 and FLAIR hyperintense cytotoxic edema. No hemorrhagic  transformation or mass effect. Contralateral small and solitary appearing cortical area of restricted diffusion in the inferior right parietal lobe series 5, image 82. Minor T2 and FLAIR hyperintensity with no hemorrhage or mass effect. No deep gray nuclei or posterior fossa involvement. Underlying advanced chronic cerebral white matter disease with deep white matter capsule involvement more pronounced in the left corona radiata. Chronic right caudate lacunar infarct. And several small chronic cerebellar lacunar infarcts are new or increased since 2022 (series 10 image 7). No significant chronic cerebral blood products. No midline shift, mass effect, evidence of mass lesion, ventriculomegaly, extra-axial collection or acute intracranial hemorrhage. Cervicomedullary junction and pituitary are within normal limits. Vascular: Major intracranial vascular flow voids are stable since 2022. Skull and upper cervical spine: Partially visible chronically advanced cervical spine degeneration. Skull bone marrow signal is normal. Sinuses/Orbits: Negative. Other: Mastoids are clear. Visible internal auditory structures appear normal. Negative visible scalp and face. IMPRESSION: 1. Acute infarct in the Left MCA anterior versus middle division, patchy involvement of a roughly 5 cm area of the anterior insula and operculum. 2. Superimposed small acute cortical infarct also in the contralateral Right parietal lobe. 3. No associated hemorrhage or intracranial mass effect. Underlying advanced chronic small vessel disease. 4. Advanced chronic cervical spine degeneration. Electronically Signed   By: Odessa Fleming M.D.   On: 06/17/2023 04:04   CT C-SPINE NO CHARGE Result Date: 06/16/2023 CLINICAL DATA:  Trauma, fall EXAM: CT CERVICAL SPINE WITHOUT CONTRAST TECHNIQUE: Multidetector CT imaging of the cervical spine was performed without intravenous contrast. Multiplanar CT image reconstructions were also generated. RADIATION DOSE REDUCTION:  This exam was performed according to the departmental dose-optimization program which includes automated exposure control, adjustment of the mA and/or kV according to patient size and/or use of iterative reconstruction technique. COMPARISON:  None Available. FINDINGS: Alignment: Straightening and slight reversal of the normal cervical lordosis. No listhesis. No facet subluxation or dislocation. Skull base and vertebrae: No compression fracture or displaced fracture in the cervical spine. Multiple small lucent foci along the endplates likely reflecting degenerative subcortical cystic change. Soft tissues and spinal canal: No prevertebral fluid or swelling. No visible canal hematoma. 2 cm left thyroid nodule. Disc levels: Moderate disc space narrowing at multiple levels. Degenerative endplate osteophytes most pronounced from C3-4 to C5-6. Disc osteophyte complexes at multiple levels. Mild spinal canal stenosis at C3-4 and C4-5. Facet arthrosis at multiple levels. Significant foraminal narrowing at multiple levels most pronounced at C5-6. Upper chest: Emphysema. Other: None IMPRESSION: No acute fracture or traumatic malalignment of the cervical spine. Degenerative changes as above. 2 cm left thyroid nodule. Recommend nonemergent thyroid ultrasound for  further evaluation. Aortic Atherosclerosis (ICD10-I70.0) and Emphysema (ICD10-J43.9). Electronically Signed   By: Emily Filbert M.D.   On: 06/16/2023 12:37   CT ANGIO HEAD NECK W WO CM (CODE STROKE) Result Date: 06/16/2023 CLINICAL DATA:  Code stroke, expressive aphasia, fall. EXAM: CT ANGIOGRAPHY HEAD AND NECK WITH AND WITHOUT CONTRAST TECHNIQUE: Multidetector CT imaging of the head and neck was performed using the standard protocol during bolus administration of intravenous contrast. Multiplanar CT image reconstructions and MIPs were obtained to evaluate the vascular anatomy. Carotid stenosis measurements (when applicable) are obtained utilizing NASCET criteria,  using the distal internal carotid diameter as the denominator. RADIATION DOSE REDUCTION: This exam was performed according to the departmental dose-optimization program which includes automated exposure control, adjustment of the mA and/or kV according to patient size and/or use of iterative reconstruction technique. CONTRAST:  60mL OMNIPAQUE IOHEXOL 350 MG/ML SOLN COMPARISON:  Same day CT head.  CTA head and neck 03/16/2021. FINDINGS: CTA NECK FINDINGS Aortic arch: Common origin of the brachiocephalic and left common carotid arteries. Imaged portion shows no evidence of aneurysm or dissection. Mild atherosclerosis of the aortic arch. No significant stenosis of the major arch vessel origins. Pulmonary arteries: As permitted by contrast timing, there are no filling defects in the visualized pulmonary arteries. Subclavian arteries: The subclavian arteries are patent bilaterally. Atherosclerosis at the origin of the left subclavian artery without significant stenosis. Right carotid system: No evidence of dissection, stenosis (50% or greater), or occlusion. Retropharyngeal course of the distal common carotid artery and proximal cervical ICA. Left carotid system: No evidence of dissection, stenosis (50% or greater), or occlusion. Retropharyngeal course of the distal common carotid artery and proximal cervical ICA. Vertebral arteries: Codominant. No evidence of dissection or occlusion. There is distal tapering and mild narrowing involving the distal V4 segment of the right vertebral artery. No stenosis greater than 50%. Skeleton: No acute findings. Degenerative changes in the cervical spine. Other neck: The visualized airway is patent. No cervical lymphadenopathy. Nodule in the left thyroid lobe measuring up to 2 cm. Upper chest: Emphysema. Dependent opacities likely reflecting atelectasis. There is mild thickening of the upper thoracic esophageal wall. Prominent superior paratracheal node near the tracheoesophageal  groove measuring up to 1.2 cm in short axis. Review of the MIP images confirms the above findings CTA HEAD FINDINGS ANTERIOR CIRCULATION: The intracranial ICAs are patent bilaterally. Atherosclerosis of the carotid siphons. Moderate stenosis at the posterior genu of the left cavernous ICA. Additional mild-to-moderate stenosis of the anterior genu cavernous and supraclinoid right ICA. No proximal occlusion, aneurysm, or vascular malformation. MCAs: The middle cerebral arteries are patent bilaterally. There is moderate stenosis of a proximal M2 inferior division branch of the right MCA without occlusion. Early branching of the left M1 segment. ACAs: The anterior cerebral arteries are patent bilaterally. POSTERIOR CIRCULATION: No significant stenosis, proximal occlusion, aneurysm, or vascular malformation. PCAs: Patent bilaterally.  Fetal origin of the left PCA. Pcomm: Visualized on the left. SCAs: The superior cerebellar arteries are patent bilaterally. Basilar artery: Patent AICAs: Not well visualized. PICAs: Patent Vertebral arteries: The intracranial vertebral arteries are patent. Venous sinuses: Not well visualized due to contrast timing. Anatomic variants: Fetal origin of the left PCA. Review of the MIP images confirms the above findings IMPRESSION: No large vessel occlusion. Multifocal atherosclerosis as above. Moderate stenosis at the posterior genu of the left cavernous ICA. Additional mild-to-moderate stenosis at the anterior genu of the right cavernous ICA and right supraclinoid ICA. Focal moderate stenosis of a proximal  M2 inferior division branch of the right MCA. Wall thickening of the upper thoracic esophagus. Adjacent mildly enlarged upper paratracheal lymph node. Recommend correlation with history of esophagitis. Consider dedicated CT chest for further evaluation. Left thyroid nodule measures up to 2 cm. Recommend correlation with nonemergent thyroid ultrasound. Aortic Atherosclerosis (ICD10-I70.0) and  Emphysema (ICD10-J43.9). These results were communicated to Dr. Otelia Limes At 12:05 pm on 06/16/2023 by phone call. Electronically Signed   By: Emily Filbert M.D.   On: 06/16/2023 12:30   CT HEAD CODE STROKE WO CONTRAST Result Date: 06/16/2023 CLINICAL DATA:  Code stroke. Neuro deficit, acute, stroke suspected. Aphasia. Fall. EXAM: CT HEAD WITHOUT CONTRAST TECHNIQUE: Contiguous axial images were obtained from the base of the skull through the vertex without intravenous contrast. RADIATION DOSE REDUCTION: This exam was performed according to the departmental dose-optimization program which includes automated exposure control, adjustment of the mA and/or kV according to patient size and/or use of iterative reconstruction technique. COMPARISON:  Head CT 03/17/2021 and MRI 03/16/2021 FINDINGS: Brain: There is no evidence of an acute infarct, intracranial hemorrhage, mass, midline shift, or extra-axial fluid collection. Patchy to confluent hypodensities in the cerebral white matter bilaterally have mildly progressed and are nonspecific but compatible with moderate to severe chronic small vessel ischemic disease. A small cortical infarct at the anterior aspect of the left insula and frontal operculum is new but chronic in appearance. There is an unchanged small chronic infarct involving the left basal ganglia and corona radiata. There is mild cerebral atrophy. Vascular: Calcified atherosclerosis at the skull base. No hyperdense vessel. Skull: No acute fracture or suspicious lesion. Sinuses/Orbits: Visualized paranasal sinuses and mastoid air cells are clear. Unremarkable orbits. Other: None. ASPECTS (Alberta Stroke Program Early CT Score) - Ganglionic level infarction (caudate, lentiform nuclei, internal capsule, insula, M1-M3 cortex): 7 - Supraganglionic infarction (M4-M6 cortex): 3 Total score (0-10 with 10 being normal): 10 These results were communicated to Dr. Otelia Limes at 11:48 am on 06/16/2023 by text page via the  [facility] messaging system. IMPRESSION: 1. No evidence of acute intracranial abnormality. ASPECTS of 10. 2. Moderate to severe chronic small vessel ischemic disease. 3. Interval small chronic left MCA infarct. Electronically Signed   By: Sebastian Ache M.D.   On: 06/16/2023 11:51     Assessment and Plan:   New onset A-fib Severe AS s/p TAVR 2018 PAD s/p partial R foot amputations 02/24/2023 - Patient is very hard of hearing, therefore most of the history is obtained from wife. Per wife, patient has not reported any CP, SOB, palpations, dizziness, lightheadedness, or syncope prior to this ED visit.  Patient has recently been congested from cold, which has resolved.  Wife denies any history of fall or bleeding risk. Risk Factors for afib include HTN, DM, CHF, Stroke/TIA, Chronic kidney disease, and Obesity. Denies any tobacco, ETOH, or drug use.  - CHADSVASC 8  - EKG: A-fib, HR 115, PVCs prolonged QTC 522 (vs 04/25/22 NSR, HR 70, PVCs)  - Patient previously on ASA and Plavix.  With recent stroke most likely 2/2 to cardio embolic source from new onset a fib and CHADSVASC, pt needs to be started on Eliquis to prevent stroke reoccurrence. Triple therapy would increase the risk of bleeding - Per Lesle Chris, PA-C note on 01/08/2017 for s/p TAVR: "Okay to d/c plavix after 6 m/o"  - I would recommended starting Eliquis, continuing 81mg  ASA and d/c plavix. Will defer to Neurology to decide on time of starting - From heart standpoint, I think  ASA is safe to continue but ultimately it should be up to neuro.   HTN -Aggressive BP control, goal SBP less than 180/105 -As needed labetalol, Cleviprex as needed - When neurology clears for permissive HTN, cardio can intervene to titrate BP medications  HFrEF, Biventricular failure - Echo 03/09/2023: LVEF 25 to 30%, LV severely decreased function, global hypokinesis, LV cavity mildly dilated, G3 DD, interventricular septum flattened in systole and diastole, C/W  right ventricular pressure and volume overload.  RV systolic is low normal, RV size is moderately enlarged, mildly increased RV wall thickness.  L/R atrial size moderately dilated.  Mild MVR.  Aortic valve prosthesis present and in position without regurgitation.  Ascending aorta dilation of 38 mm.  - Euvolemic on exam.  - GDMT includes carvedilol, Jardiance, Entresto, spironolactone and Lasix. Currently on hold until neurology clears for permissive HTN.   HLD  - Statin for goal LDL < 70  - Continue Crestor 5 mg   Risk Assessment/Risk Scores:    New York Heart Association (NYHA) Functional Class NYHA Class III  CHA2DS2-VASc Score = 8  This indicates a 10.8% annual risk of stroke. The patient's score is based upon: CHF History: 1 HTN History: 1 Diabetes History: 1 Stroke History: 2 Vascular Disease History: 1 Age Score: 2 Gender Score: 0       For questions or updates, please contact [facility] Please consult [email or web address] for contact info under    Signed, Basilio Cairo, PA-C  06/17/2023 4:30 PM   I have personally seen and examined the patient.  My HPI, Exam, and assessment and plan are below, independent of the NPP above.  Robert Horn is an 89 year old male with biventricular heart failure and atrial fibrillation who presents with a recent stroke. He is accompanied by his wife.  He experienced a recent stroke, having been found on the floor and attempting to get up when paramedics arrived. He does not recall specific details about the event but remembers being on the floor and trying to get up. He has a history of a transient ischemic attack but did not have atrial fibrillation at that time. He received TNK for the stroke and has since been monitored for atrial fibrillation. No slurred speech is noted.  He has a history of biventricular heart failure with an ejection fraction of 25-30%. He is on guideline-directed medical therapy including Entresto,  spironolactone, Coreg, and Jardiance. His wife reports no recent chest pain, shortness of breath, or palpitations. He has severe biventricular dysfunction but is well compensated and not experiencing shortness of breath or fluid overload.  He has a history of peripheral arterial disease and a hx of transcatheter aortic valve replacement that is appropriate. He also had a partial foot amputation in November 2024 due to non-healing wounds, which was managed by vascular surgery. His wife detailed a complex history of wound management and medication adjustments around the time of his surgery, which led to congestion and fluid buildup, but this has since improved.  He is hard of hearing  Exam notable for  Gen: no distress  Neck: No JVD Ears: No Homero Fellers Sign Cardiac: No Rubs or Gallops, No murmur, IRIR +2 radial pulses Respiratory: Clear to auscultation bilaterally, normal effort, normal  respiratory rate GI: Soft, nontender, non-distended  MS: No edema;  moves all extremities Integument: Skin is temperate, no cool extremities Neuro:  At time of evaluation alert and oriented to person/place/time/situation  Psych: Normal affect, patient feels ok  Tele:  AF with resting rates 100; when active ~ 130   Atrial Fibrillation (AF)   Newly diagnosed paroxysmal AF identified during stroke workup. Heart rate elevated, ranging from 90s to 110s, increasing with movement. Not on anticoagulation, crucial to prevent embolic stroke. Plan to start anticoagulation once cleared by neurology. We have discussed this with Dr. Roda Shutters and will start eliquis tonight (passed swallow). On GDMT for heart failure, including carvedilol, which may be adjusted based on heart rate control. Anticoagulation is prioritized to reduce stroke risk, with a focus on balancing bleeding risk due to recent hematuria.   - Start anticoagulation (Eliquis) once cleared by neurology   - Monitor heart rate and adjust carvedilol as needed   - Communicated  with neurology regarding anticoagulation and heart rate management   - will get PRN IV BB  Stroke   Experienced a stroke, treated with TNK. History of TIA but no prior AF. Recovered to some extent, with no slurred speech and the ability to roll over, but the full extent of recovery is unclear. Neurology is leading care for the stroke, and the timing of anticoagulation initiation is dependent on his clearance. The risk of embolic stroke is significant, estimated at 10% per year due to risk factors including AF, PAD, heart failure, and diabetes.   CHADVASC ~8  Biventricular Heart Failure   Severe biventricular dysfunction with an ejection fraction of 25-30%. On GDMT for heart failure, including Entresto, spironolactone, carvedilol, and Jardiance. Heart failure is well-compensated with no shortness of breath or fluid overload. Medications were paused due to the stroke, and the plan is to gradually reintroduce them as the condition stabilizes. Heart function is significantly decreased, but he has not had frequent hospitalizations for heart failure, which is a positive prognostic factor.   - Presently he is on midodrine; no GDMT due to hypotension  Peripheral Arterial Disease (PAD)   PAD with a history of TAVR for aortic stenosis. Valve is functioning well post-TAVR. Also had a foot amputation due to complications from PAD, which has healed after intervention at a wound management center. Vascular surgeons are pleased with the current status.   - he sees vascular surgery at [facility], MD FASE [facility] Cardiologist [facility]  [address] [place], [address] [place], [place] [address] [phone]  6:29 PM

## 2023-06-17 NOTE — Evaluation (Signed)
 Physical Therapy Evaluation Patient Details Name: Robert Horn MRN: [id number] DOB: [birth year] Today's Date: 06/17/2023  History of Present Illness  Pt adm 3/19 with aphasia.  MRI: Acute infarct in the Left MCA anterior versus middle division,  patchy involvement of a roughly 5 cm area of the anterior insula and  operculum.  2. Superimposed small acute cortical infarct also in the  contralateral Right parietal lobe. PMH includes: rt transmet amputation. colon cancer, CHF, CKD, CAD, DM II, HTN, PVD, neuropathy.  Clinical Impression  Patient presents with decreased mobility due to generalized weakness with prolonged bedrest and due to limited activity tolerance (noted in a-fib with HR up to 140's with ambulation).  Still mobilizing well able to walk in hallway and negotiate stairs, likely to progress and not need follow up PT though PT will follow during acute stay to progress activity tolerance and more formally assess balance for community mobility.          If plan is discharge home, recommend the following: Assistance with cooking/housework;Supervision due to cognitive status;Help with stairs or ramp for entrance;Assist for transportation   Can travel by private vehicle        Equipment Recommendations None recommended by PT  Recommendations for Other Services       Functional Status Assessment Patient has had a recent decline in their functional status and demonstrates the ability to make significant improvements in function in a reasonable and predictable amount of time.     Precautions / Restrictions Precautions Precautions: Fall Recall of Precautions/Restrictions: Intact Precaution/Restrictions Comments: Watch HR      Mobility  Bed Mobility Overal bed mobility: Needs Assistance Bed Mobility: Supine to Sit     Supine to sit: Supervision     General bed mobility comments: assist for lines    Transfers Overall transfer level: Needs assistance   Transfers: Sit  to/from Stand Sit to Stand: Supervision, Contact guard assist   Step pivot transfers: Supervision       General transfer comment: assist for lines/safety    Ambulation/Gait Ambulation/Gait assistance: Contact guard assist, Supervision Gait Distance (Feet): 400 Feet Assistive device: None Gait Pattern/deviations: Step-through pattern, Decreased stride length       General Gait Details: decreased arm swing on R and mild deviation due to R tranmet amputation, slower and with CGA initially; progressing over time to supervision and with increased speed/stride length  Stairs Stairs: Yes Stairs assistance: Contact guard assist Stair Management: One rail Right, Step to pattern, Forwards Number of Stairs: 4 General stair comments: assist for safety, step to leading with L to ascend and to descend despite cues for R first to descend  Wheelchair Mobility     Tilt Bed    Modified Rankin (Stroke Patients Only) Modified Rankin (Stroke Patients Only) Pre-Morbid Rankin Score: No symptoms Modified Rankin: Moderate disability     Balance Overall balance assessment: Mild deficits observed, not formally tested                                           Pertinent Vitals/Pain Pain Assessment Pain Assessment: No/denies pain    Home Living Family/patient expects to be discharged to:: Private residence Living Arrangements: Spouse/significant other Available Help at Discharge: Family;Available 24 hours/day Type of Home: House Home Access: Stairs to enter Entrance Stairs-Rails: None Entrance Stairs-Number of Steps: front: 3 large steps - deep.  Back: 5 steps to  the deck Alternate Level Stairs-Number of Steps: Enter to main level from the front entrance.  12 steps to upstairs bedrooms. Home Layout: Multi-level;Able to live on main level with bedroom/bathroom Home Equipment: Rolling Walker (2 wheels);Cane - single point;BSC/3in1;Shower seat;Electric scooter      Prior  Function Prior Level of Function : Independent/Modified Independent;Working/employed;Driving             Mobility Comments: No AD ADLs Comments: Ind     Extremity/Trunk Assessment   Upper Extremity Assessment Upper Extremity Assessment: Defer to OT evaluation    Lower Extremity Assessment Lower Extremity Assessment: RLE deficits/detail RLE Deficits / Details: R transmet amputation, reports has orthopedic shoe; otherwise WFL    Cervical / Trunk Assessment Cervical / Trunk Assessment: Normal  Communication   Communication Factors Affecting Communication: Difficulty expressing self;Reduced clarity of speech (expressive aphasia)    Cognition Arousal: Alert Behavior During Therapy: WFL for tasks assessed/performed   PT - Cognitive impairments: Difficult to assess Difficult to assess due to: Impaired communication                       Following commands: Impaired Following commands impaired: Follows one step commands inconsistently, Follows one step commands with increased time     Cueing Cueing Techniques: Verbal cues, Gestural cues, Tactile cues     General Comments General comments (skin integrity, edema, etc.): a-fib intermittent elevation to 140's though in 110's as well and RN aware; BP 114/82 sitting on EOB and 135/81 after ambulation in hallway    Exercises     Assessment/Plan    PT Assessment Patient needs continued PT services  PT Problem List Decreased mobility;Decreased safety awareness;Decreased activity tolerance       PT Treatment Interventions Gait training;Stair training;Balance training;Therapeutic activities;Patient/family education    PT Goals (Current goals can be found in the Care Plan section)  Acute Rehab PT Goals Patient Stated Goal: walk, recover PT Goal Formulation: With patient Time For Goal Achievement: 07/01/23 Potential to Achieve Goals: Good    Frequency Min 3X/week     Co-evaluation PT/OT/SLP  Co-Evaluation/Treatment: Yes Reason for Co-Treatment: Complexity of the patient's impairments (multi-system involvement) PT goals addressed during session: Mobility/safety with mobility;Balance OT goals addressed during session: ADL's and self-care       AM-PAC PT "6 Clicks" Mobility  Outcome Measure Help needed turning from your back to your side while in a flat bed without using bedrails?: None Help needed moving from lying on your back to sitting on the side of a flat bed without using bedrails?: None Help needed moving to and from a bed to a chair (including a wheelchair)?: A Little Help needed standing up from a chair using your arms (e.g., wheelchair or bedside chair)?: A Little Help needed to walk in hospital room?: A Little Help needed climbing 3-5 steps with a railing? : A Little 6 Click Score: 20    End of Session Equipment Utilized During Treatment: Gait belt Activity Tolerance: Patient tolerated treatment well Patient left: in chair;with call bell/phone within reach;with chair alarm set   PT Visit Diagnosis: Other symptoms and signs involving the nervous system (R29.898)    Time: 1610-9604 PT Time Calculation (min) (ACUTE ONLY): 24 min   Charges:   PT Evaluation $PT Eval Low Complexity: 1 Low   PT General Charges $$ ACUTE PT VISIT: 1 Visit         Sheran Lawless, PT Acute Rehabilitation Services [phone] 06/17/2023   Elray Mcgregor 06/17/2023,  4:26 PM

## 2023-06-17 NOTE — Progress Notes (Signed)
 SLP Cancellation Note  Patient Details Name: Nils Thor MRN: [id number] DOB: 02/25/1934   Cancelled evaluation:     Pt out of room for procedure; with MD on earlier attempt to see. Will continue efforts to address speech/language evaluation.  Barett Whidbee L. Samson Frederic, MA CCC/SLP Clinical Specialist - Acute Care SLP Acute Rehabilitation Services Office number [phone]       Blenda Mounts Laurice 06/17/2023, 12:03 PM

## 2023-06-17 NOTE — Evaluation (Signed)
 Occupational Therapy Evaluation Patient Details Name: Robert Horn MRN: [id number] DOB: [birth year] Today's Date: 06/17/2023   History of Present Illness   Pt adm 3/19 with aphasia.  MRI: Acute infarct in the Left MCA anterior versus middle division,  patchy involvement of a roughly 5 cm area of the anterior insula and  operculum.  2. Superimposed small acute cortical infarct also in the  contralateral Right parietal lobe. PMH includes: rt transmet amputation. colon cancer, CHF, CKD, CAD, DM II, HTN, PVD, neuropathy.     Clinical Impressions Patient admitted for the diagnosis above.  PTA he lives at home with his spouse, continues to work and needed no assist for [employer] and mobility.  Primary deficits is expressive aphasia, and he has mild command following deficits, but is largely supervision for safety for ADL and mobility without an AD.  OT will follow in the acute setting to address deficits, and no post acute OT is anticipated.       If plan is discharge home, recommend the following:   Assist for transportation;Direct supervision/assist for medications management     Functional Status Assessment   Patient has had a recent decline in their functional status and demonstrates the ability to make significant improvements in function in a reasonable and predictable amount of time.     Equipment Recommendations   None recommended by OT     Recommendations for Other Services         Precautions/Restrictions   Precautions Precautions: Fall Recall of Precautions/Restrictions: Intact Precaution/Restrictions Comments: Watch HR Restrictions Weight Bearing Restrictions Per Provider Order: No     Mobility Bed Mobility Overal bed mobility: Needs Assistance Bed Mobility: Supine to Sit     Supine to sit: Supervision          Transfers Overall transfer level: Needs assistance   Transfers: Sit to/from Stand, Bed to chair/wheelchair/BSC Sit to Stand:  Supervision     Step pivot transfers: Supervision            Balance Overall balance assessment: Mild deficits observed, not formally tested                                         ADL either performed or assessed with clinical judgement   ADL       Grooming: Supervision/safety               Lower Body Dressing: Supervision/safety   Toilet Transfer: Supervision/safety                   Vision Baseline Vision/History: 1 Wears glasses Patient Visual Report: No change from baseline       Perception Perception: Not tested       Praxis Praxis: Not tested       Pertinent Vitals/Pain Pain Assessment Pain Assessment: No/denies pain     Extremity/Trunk Assessment Upper Extremity Assessment Upper Extremity Assessment: Overall WFL for tasks assessed   Lower Extremity Assessment Lower Extremity Assessment: Defer to PT evaluation   Cervical / Trunk Assessment Cervical / Trunk Assessment: Normal   Communication Communication Factors Affecting Communication: Difficulty expressing self;Reduced clarity of speech   Cognition Arousal: Alert Behavior During Therapy: [facility] for tasks assessed/performed Cognition: Difficult to assess Difficult to assess due to: Impaired communication  Following commands: Impaired Following commands impaired: Follows one step commands inconsistently, Follows one step commands with increased time     Cueing  General Comments   Cueing Techniques: Verbal cues;Gestural cues;Tactile cues   Watch HR   Exercises     Shoulder Instructions      Home Living Family/patient expects to be discharged to:: Private residence Living Arrangements: Spouse/significant other Available Help at Discharge: Family;Available 24 hours/day Type of Home: House Home Access: Stairs to enter [employer] of Steps: front: 3 large steps - deep.  Back: 5 steps to the deck Entrance  Stairs-Rails: None Home Layout: Multi-level;Able to live on main level with bedroom/bathroom Alternate Level Stairs-Number of Steps: Enter to main level from the front entrance.  12 steps to upstairs bedrooms. Alternate Level Stairs-Rails: Right Bathroom Shower/Tub: Producer, television/film/video: Standard Bathroom Accessibility: Yes How Accessible: Accessible via walker Home Equipment: Rolling Walker (2 wheels);Cane - single point;BSC/3in1;Shower seat;Electric scooter          Prior Functioning/Environment Prior Level of Function : Independent/Modified Independent;Working/employed;Driving             Mobility Comments: No AD ADLs Comments: Ind    OT Problem List: Decreased safety awareness   OT Treatment/Interventions: Self-care/ADL training;Therapeutic activities;Patient/family education;Balance training;DME and/or AE instruction      OT Goals(Current goals can be found in the care plan section)   Acute Rehab OT Goals Patient Stated Goal: Return home OT Goal Formulation: With patient Time For Goal Achievement: 07/01/23 Potential to Achieve Goals: Good ADL Goals Pt Will Perform Grooming: Independently;standing Pt Will Perform Lower Body Dressing: Independently;sit to/from stand Pt Will Transfer to Toilet: Independently;ambulating;regular height toilet   OT Frequency:  Min 1X/week    Co-evaluation              AM-PAC OT "6 Clicks" Daily Activity     Outcome Measure Help from another person eating meals?: None Help from another person taking care of personal grooming?: A Little Help from another person toileting, which includes using toliet, bedpan, or urinal?: A Little Help from another person bathing (including washing, rinsing, drying)?: A Little Help from another person to put on and taking off regular upper body clothing?: None Help from another person to put on and taking off regular lower body clothing?: A Little 6 Click Score: 20   End of  Session Equipment Utilized During Treatment: Gait belt Nurse Communication: Mobility status  Activity Tolerance: Patient tolerated treatment well Patient left: in chair;with call bell/phone within reach;with chair alarm set  OT Visit Diagnosis: Cognitive communication deficit (R41.841) Symptoms and signs involving cognitive functions: Cerebral infarction                Time: 4401-0272 OT Time Calculation (min): 24 min Charges:  OT General Charges $OT Visit: 1 Visit OT Evaluation $OT Eval Moderate Complexity: 1 Mod  06/17/2023  RP, OTR/L  Acute Rehabilitation Services  Office:  [phone]   Suzanna Obey 06/17/2023, 2:57 PM

## 2023-06-17 NOTE — Progress Notes (Signed)
 PT Cancellation Note  Patient Details Name: Robert Horn MRN: [id number] DOB: January 14, 1934   Cancelled Treatment:    Reason Eval/Treat Not Completed: Medical issues which prohibited therapy; RN reports 24 hours post TNK Will be up at noon then going for CT.  Will follow up later in pm.    Elray Mcgregor 06/17/2023, 10:03 AM Sheran Lawless, PT Acute Rehabilitation Services Office:[phone] 06/17/2023

## 2023-06-17 NOTE — Progress Notes (Signed)
 STROKE TEAM PROGRESS NOTE    SIGNIFICANT HOSPITAL EVENTS  3/19: TNK administered at 1149   INTERIM HISTORY/SUBJECTIVE Wife and daughter at bedside.  RN at bedside.  On exam, patient is sitting up in bed, awake, alert, oriented to place and people, but not to age or time.  He does have expressive aphasia but he is able to speak in some short words but does have some paraphasic errors and word salad/nonsensical words at times.  Able to follow most simple commands.  Slight weakness seen in BLE.   OBJECTIVE  CBC    Component Value Date/Time   WBC 7.7 06/17/2023 0358   RBC 3.77 (L) 06/17/2023 0358   HGB 11.0 (L) 06/17/2023 0358   HGB 12.6 (L) 11/24/2018 0849   HCT 34.7 (L) 06/17/2023 0358   HCT 38.7 11/24/2018 0849   PLT PLATELET CLUMPS NOTED ON SMEAR, UNABLE TO ESTIMATE 06/17/2023 0358   PLT 82 (LL) 11/24/2018 0849   MCV 92.0 06/17/2023 0358   MCV 91 11/24/2018 0849   MCH 29.2 06/17/2023 0358   MCHC 31.7 06/17/2023 0358   RDW 14.9 06/17/2023 0358   RDW 13.8 11/24/2018 0849   LYMPHSABS 1.9 06/16/2023 1128   MONOABS 1.1 (H) 06/16/2023 1128   EOSABS 0.2 06/16/2023 1128   BASOSABS 0.1 06/16/2023 1128    BMET    Component Value Date/Time   NA 138 06/17/2023 0358   NA 142 04/05/2023 1536   K 4.6 06/17/2023 0358   CL 111 06/17/2023 0358   CO2 21 (L) 06/17/2023 0358   GLUCOSE 95 06/17/2023 0358   BUN 31 (H) 06/17/2023 0358   BUN 32 (H) 04/05/2023 1536   CREATININE 1.91 (H) 06/17/2023 0358   CREATININE 1.25 (H) 12/17/2015 0825   CALCIUM 8.6 (L) 06/17/2023 0358   EGFR 38 (L) 04/05/2023 1536   GFRNONAA 34 (L) 06/17/2023 0358    IMAGING past 24 hours CT HEAD WO CONTRAST ( ) Result Date: 06/17/2023 CLINICAL DATA:  Stroke, follow-up.  24 hour post TNK. EXAM: CT HEAD WITHOUT CONTRAST TECHNIQUE: Contiguous axial images were obtained from the base of the skull through the vertex without intravenous contrast. RADIATION DOSE REDUCTION: This exam was performed according to the  departmental dose-optimization program which includes automated exposure control, adjustment of the mA and/or kV according to patient size and/or use of iterative reconstruction technique. COMPARISON:  MR head without contrast 06/17/2023. CT head without contrast 06/16/2023. FINDINGS: Brain: The acute nonhemorrhagic infarct involving the left frontal operculum and anterior insular cortex is stable. No significant expansion of the infarct is present. Moderate atrophy and diffuse white matter disease is stable. Remote lacunar infarcts are present in the left lentiform nucleus right caudate head. The ventricles are proportionate to the degree of atrophy. No significant extraaxial fluid collection is present. The brainstem and cerebellum are within normal limits. Midline structures are within normal limits. Vascular: Atherosclerotic calcifications are present within the cavernous internal carotid arteries bilaterally. No hyperdense vessel is present. Skull: Calvarium is intact. No focal lytic or blastic lesions are present. No significant extracranial soft tissue lesion is present. Sinuses/Orbits: The paranasal sinuses and mastoid air cells are clear. The globes and orbits are within normal limits. IMPRESSION: 1. Stable acute nonhemorrhagic infarct involving the left frontal operculum and anterior insular cortex. 2. Stable moderate atrophy and diffuse white matter disease. This likely reflects the sequela of chronic microvascular ischemia. 3. Remote lacunar infarcts of the left lentiform nucleus and right caudate head. Electronically Signed   By: Cristal Deer  Mattern M.D.   On: 06/17/2023 15:50   MR BRAIN WO CONTRAST Result Date: 06/17/2023 CLINICAL DATA:  89 year old male code stroke presentation. Fall. Intracranial atherosclerosis. EXAM: MRI HEAD WITHOUT CONTRAST TECHNIQUE: Multiplanar, multiecho pulse sequences of the brain and surrounding structures were obtained without intravenous contrast. COMPARISON:  CT  head, CTA head and neck yesterday. Brain MRI 03/16/2021. FINDINGS: Brain: Cortical, patchy and confluent restricted diffusion in the anterior or middle left MCA division territory. Anterior insula and operculum are affected, in an area of about 5 cm (series 7, image 61). T2 and FLAIR hyperintense cytotoxic edema. No hemorrhagic transformation or mass effect. Contralateral small and solitary appearing cortical area of restricted diffusion in the inferior right parietal lobe series 5, image 82. Minor T2 and FLAIR hyperintensity with no hemorrhage or mass effect. No deep gray nuclei or posterior fossa involvement. Underlying advanced chronic cerebral white matter disease with deep white matter capsule involvement more pronounced in the left corona radiata. Chronic right caudate lacunar infarct. And several small chronic cerebellar lacunar infarcts are new or increased since 2022 (series 10 image 7). No significant chronic cerebral blood products. No midline shift, mass effect, evidence of mass lesion, ventriculomegaly, extra-axial collection or acute intracranial hemorrhage. Cervicomedullary junction and pituitary are within normal limits. Vascular: Major intracranial vascular flow voids are stable since 2022. Skull and upper cervical spine: Partially visible chronically advanced cervical spine degeneration. Skull bone marrow signal is normal. Sinuses/Orbits: Negative. Other: Mastoids are clear. Visible internal auditory structures appear normal. Negative visible scalp and face. IMPRESSION: 1. Acute infarct in the Left MCA anterior versus middle division, patchy involvement of a roughly 5 cm area of the anterior insula and operculum. 2. Superimposed small acute cortical infarct also in the contralateral Right parietal lobe. 3. No associated hemorrhage or intracranial mass effect. Underlying advanced chronic small vessel disease. 4. Advanced chronic cervical spine degeneration. Electronically Signed   By: Odessa Fleming M.D.    On: 06/17/2023 04:04    Vitals:   06/17/23 1400 06/17/23 1500 06/17/23 1559 06/17/23 1600  BP: 101/68 90/68  98/71  Pulse: 69 83  70  Resp: (!) 22 15  (!) 29  Temp:   98.4 F (36.9 C)   TempSrc:   Oral   SpO2: 100% 97%  96%  Weight:         PHYSICAL EXAM General:  Alert, well-nourished, well-developed patient in no acute distress Psych:  Mood and affect appropriate for situation CV: Regular rate and rhythm on monitor Respiratory:  Regular, unlabored respirations on room air GI: Abdomen soft and nontender   NEURO:  Mental Status: Awake, alert, oriented to place and people, not to time.  And age he said 69 first then 82 and with prompt stated 15.  Speech/Language: Expressive aphasia but he is able to speak in some short words with paraphasic errors and word salad/nonsensical statements.  Cranial Nerves:  II: PERRL. Visual fields full.  III, IV, VI: EOMI. Eyelids elevate symmetrically.  V: Sensation is intact to light touch and symmetrical to face.  VII: Face is symmetrical resting and smiling VIII: hearing intact to voice. IX, X: Palate elevates symmetrically. Phonation is normal.  WU:[id number] shrug 5/5. XII: tongue is midline without fasciculations. Motor:  BUE: Against gravity strength no drift LLE: 4/5, no drift RLE 4 -/5 proximally, 3/5 distal (right toes amputated).  Tone: is normal and bulk is normal Sensation- Intact to light touch bilaterally. Extinction absent to light touch to DSS.   Coordination: FTN intact  bilaterally, HKS: no ataxia in BLE.No drift.  Gait- deferred  Most Recent NIH: 4    ASSESSMENT/Horn  Robert Horn is a 89 y.o. male with history of DM2, HTN, HLD, CAD, combined HF R EF, PAD status post right toes amputation, CKD 3, colon cancer status post colectomy, former smoker, recent influenza infection who presented to ED as an activated code stroke due to aphasia status post unwitnessed fall. TNK administered 05/1909 49. Admitted for full  stroke workup and post TNK monitoring.  MRI revealed acute infarct left MCA anterior versus middle division and a superimposed small acute cortical infarct of the right parietal lobe. NIH on Admission:6  Stroke: Bilateral MCA small infarcts s/p TNK, etiology: Likely due to new diagnosed A-fib Code Stroke CT head No acute abnormality.  Interval small chronic left MCA infarct.  Small vessel disease. ASPECTS 10.    CTA head & neck No LVO. MRI  Left MCA acute infarct and superimposed` small acute cortical infarct contralateral right parietal lobe 24hour post-TNK CTH: Stable acute nonhemorrhagic infarct involving left frontal operculum and anterior insular cortex. Remote lacunar infarcts of left lentiform nucleus and right caudate head 2D Echo: LVEF 25 to 30%, LV with global hypokinesis, severely dilated left atria,  status post TAVR LDL 66 HgbA1c 6.7 UDS negative VTE prophylaxis - SCDs aspirin 81 mg daily and clopidogrel 75 mg daily prior to admission, will start Eliquis tonight given the small size of infarct Therapy recommendations:  No follow up needed  Disposition: Pending  History of TIA Admitted 02/2021 for TIA due to right M2 high-grade stenosis versus short segment occlusion. MRI negative, echo showed EF of 45 to 50%, LDL 103, A1c 6.9. Placed on aspirin and Brilinta for 30 days then aspirin and Plavix Last follow-up neurology visit noted was 05/13/2021 at [facility] - Reported difficulty tolerating Brilinta with complaints of shortness of breath and after approximately 3 weeks was changed back to aspirin and Plavix by cardiology with resolution of symptoms  Atrial fibrillation, new diagnosis Home Meds: none Atrial fibrillation with PVCs seen on admission EKG Echo today shows severely dilated left atria Continue telemetry monitoring Begin anticoagulation with Eliquis tonight  HFrEF Severe AS s/p TAVR 2018 Home meds: Valla Leaver, Lasix, spironolactone Echo today shows EF 25 to 30%,  global hypokinesis, s/p TAVR Echo 02/2023 showed LVEF 25 to 30%  Echo in 02/2021 EF 45 to 50% Cardiology consulted, appreciate help No GDMT due to hypotension  History of hypertension Now hypotensive Home meds: Spironolactone, Lasix--held Unstable Avoid hypotension Midodrine started  Hyperlipidemia Home meds: Zetia 10 mg, Crestor 40 mg LDL 66, goal < 70 Now on Crestor 40 and Zetia 10 Continue statin at discharge  Diabetes type II Uncontrolled Home meds: Semaglutide HgbA1c 6.7, goal < 7.0 CBGs SSI Recommend close follow-up with PCP for better DM control  Tobacco Abuse Patient is a former cigarette smoker  Other Stroke Risk Factors Advanced age PAD s/p R toes amputation 01/2023 Former smoker CAD  Other Active Problems History of colon cancer status post colectomy History of CKD 3 Creatinine 1.99-/2.20--1.91  Hospital day # 1   Pt seen by Neuro NP/APP and later by MD. Note/Horn to be edited by MD as needed.    Robert January, DNP, AGACNP-BC [facility] Please use AMION for contact information & EPIC for messaging.  ATTENDING NOTE: I reviewed above note and agree with the assessment and Horn. Pt was seen and examined.   Wife and daughter are at bedside.  Patient  sitting in bed, awake, alert, eyes open, orientated to place, and people but not to time, told me age "61" first and then "63" and with prompt "89". Expressive aphasia but able to speak in short words with paraphasic errors and then word salad. Able to follow most simple commands. Able to repeat simple sentences and naming 1/4. No gaze palsy, tracking bilaterally, visual field full, PERRL. No significant facial droop. Tongue midline. BUEs against gravity and no drift. LLE 4/5, RLE 4-/5 proximal and 3/5 toe movement. Sensation symmetrical bilaterally subjectively, b/l FTN intact but slow, gait not tested.   Patient small stroke bilaterally likely due to cardioembolic source of new diagnosed A-fib.   Discussed with cardiology, will start Eliquis tonight given small infarct size.  Still has low BP, put on midodrine, no GDMT due to hypotension at this time.  EF 25 to 30%, stable from 3 months ago.  Continue statin.  PT and OT no recommendation.  For detailed assessment and Horn, please refer to above/below as I have made changes wherever appropriate.   Robert Plan, MD PhD Stroke Neurology 06/17/2023 7:37 PM  This patient is critically ill due to stroke status post TNK, A-fib, heart failure and at significant risk of neurological worsening, death form recurrent stroke, hemorrhagic transmission, bleeding from TNK, heart failure, seizure. This patient's care requires constant monitoring of vital signs, hemodynamics, respiratory and cardiac monitoring, review of multiple databases, neurological assessment, discussion with family, other specialists and medical decision making of high complexity. I spent 45 minutes of neurocritical care time in the care of this patient. I had long discussion with wife and daughter at bedside, updated pt current condition, treatment Horn and potential prognosis, and answered all the questions.  They expressed understanding and appreciation.

## 2023-06-17 NOTE — TOC Initial Note (Signed)
 Transition of Care [facility]) - Initial/Assessment Note    Patient Details  Name: Robert Horn MRN: [id number] Date of Birth: [birth year]  Transition of Care [facility]) CM/SW Contact:    Lamonte Sakai, Student-Social Work Phone Number: 06/17/2023, 9:07 AM  Clinical Narrative:                    Pt admitted from home with spouse- undergoing stroke workup. Please place [facility] consult as needs arise following therapy eval.     Patient Goals and CMS Choice            Expected Discharge Plan and Services       Living arrangements for the past 2 months: Single Family Home                                      Prior Living Arrangements/Services Living arrangements for the past 2 months: Single Family Home Lives with:: Spouse                   Activities of Daily Living   ADL Screening (condition at time of admission) Independently performs ADLs?: No Does the patient have a NEW difficulty with bathing/dressing/toileting/self-feeding that is expected to last >3 days?: Yes (Initiates electronic notice to provider for possible OT consult) Does the patient have a NEW difficulty with getting in/out of bed, walking, or climbing stairs that is expected to last >3 days?: Yes (Initiates electronic notice to provider for possible PT consult) Does the patient have a NEW difficulty with communication that is expected to last >3 days?: Yes (Initiates electronic notice to provider for possible SLP consult) Is the patient deaf or have difficulty hearing?: Yes Does the patient have difficulty seeing, even when wearing glasses/contacts?: No Does the patient have difficulty concentrating, remembering, or making decisions?: Yes  Permission Sought/Granted                  Emotional Assessment       Orientation: : Oriented to Place, Oriented to Self      Admission diagnosis:  Slurred speech [R47.81] Stroke (cerebrum) [facility]) [I63.9] Patient Active Problem List   Diagnosis Date  Noted   Stroke (cerebrum) (HCC) 06/16/2023   COVID-19 virus infection 03/16/2021   TIA (transient ischemic attack) 03/12/2021   Diabetes mellitus type 2, controlled (HCC) 03/12/2021   Right foot ulcer, limited to breakdown of skin (HCC) 02/28/2020   Osteomyelitis of second toe of right foot (HCC)    Osteomyelitis of great toe of right foot (HCC)    Severe protein-calorie malnutrition (HCC)    Diabetic polyneuropathy associated with type 2 diabetes mellitus (HCC)    Cutaneous abscess of right foot    Cellulitis of right foot 01/30/2019   CKD (chronic kidney disease), stage III (HCC) 01/30/2019   Elevated LFTs 01/30/2019   Colon cancer (HCC) 01/30/2019   Anemia of chronic disease 01/30/2019   Acute on chronic combined systolic and diastolic CHF (congestive heart failure) (HCC) 03/09/2018   AKI (acute kidney injury) (HCC) 03/09/2018   Colonic mass 12/10/2017   S/P TAVR (transcatheter aortic valve replacement) 03/03/2017   Severe aortic stenosis 12/15/2016   Essential hypertension 05/09/2013   Hyperlipidemia 05/09/2013   PVC's (premature ventricular contractions) 05/09/2013   Chronic combined systolic and diastolic heart failure (HCC) 05/09/2013   PCP:  Renford Dills, MD Pharmacy:   [facility] [phone] - [place], [place] - [address] AT [facility] [address] [place] [address] Phone: [phone] Fax: [phone]  [facility] [address] [place] [address] Phone: [phone] Fax: [phone]     Social Drivers of Health (SDOH) Social History: SDOH Screenings   Food Insecurity: No Food Insecurity (06/16/2023)  Housing: Low Risk  (06/16/2023)  Transportation Needs: No Transportation Needs (06/16/2023)  Utilities: Not At Risk (06/16/2023)  Alcohol Screen: Low Risk  (03/10/2023)  Depression (PHQ2-9): Low Risk  (05/13/2021)  Financial Resource Strain: Low Risk  (03/10/2023)  Social Connections:  Moderately Integrated (06/16/2023)  Tobacco Use: Medium Risk (06/16/2023)   SDOH Interventions:     Readmission Risk Interventions     No data to display

## 2023-06-18 ENCOUNTER — Telehealth (HOSPITAL_COMMUNITY): Payer: Self-pay | Admitting: Pharmacy Technician

## 2023-06-18 ENCOUNTER — Other Ambulatory Visit (HOSPITAL_COMMUNITY): Payer: Self-pay

## 2023-06-18 ENCOUNTER — Telehealth (HOSPITAL_COMMUNITY): Payer: Self-pay

## 2023-06-18 DIAGNOSIS — I480 Paroxysmal atrial fibrillation: Secondary | ICD-10-CM | POA: Diagnosis not present

## 2023-06-18 DIAGNOSIS — I63413 Cerebral infarction due to embolism of bilateral middle cerebral arteries: Secondary | ICD-10-CM | POA: Diagnosis not present

## 2023-06-18 DIAGNOSIS — I639 Cerebral infarction, unspecified: Secondary | ICD-10-CM | POA: Diagnosis not present

## 2023-06-18 DIAGNOSIS — I10 Essential (primary) hypertension: Secondary | ICD-10-CM | POA: Diagnosis not present

## 2023-06-18 DIAGNOSIS — I5082 Biventricular heart failure: Secondary | ICD-10-CM | POA: Diagnosis not present

## 2023-06-18 LAB — GLUCOSE, CAPILLARY
Glucose-Capillary: 111 mg/dL — ABNORMAL HIGH (ref 70–99)
Glucose-Capillary: 121 mg/dL — ABNORMAL HIGH (ref 70–99)
Glucose-Capillary: 113 mg/dL — ABNORMAL HIGH (ref 70–99)
Glucose-Capillary: 124 mg/dL — ABNORMAL HIGH (ref 70–99)

## 2023-06-18 LAB — CBC
HCT: 36.1 % — ABNORMAL LOW (ref 39.0–52.0)
Hemoglobin: 11.3 g/dL — ABNORMAL LOW (ref 13.0–17.0)
MCH: 29.5 pg (ref 26.0–34.0)
MCHC: 31.3 g/dL (ref 30.0–36.0)
MCV: 94.3 fL (ref 80.0–100.0)
Platelets: 244 10*3/uL (ref 150–400)
RBC: 3.83 MIL/uL — ABNORMAL LOW (ref 4.22–5.81)
RDW: 15.3 % (ref 11.5–15.5)
WBC: 7 10*3/uL (ref 4.0–10.5)
nRBC: 0 % (ref 0.0–0.2)

## 2023-06-18 MED ORDER — EMPAGLIFLOZIN 25 MG PO TABS
25.0000 mg | ORAL_TABLET | Freq: Every day | ORAL | Status: DC
  Administered 2023-06-18 – 2023-06-21 (×4): 25 mg via ORAL
  Filled 2023-06-18 (×4): qty 1

## 2023-06-18 MED ORDER — MIDODRINE HCL 5 MG PO TABS
2.5000 mg | ORAL_TABLET | Freq: Two times a day (BID) | ORAL | Status: DC
  Administered 2023-06-18 – 2023-06-21 (×6): 2.5 mg via ORAL
  Filled 2023-06-18 (×6): qty 1

## 2023-06-18 NOTE — Progress Notes (Signed)
 STROKE TEAM PROGRESS NOTE    SIGNIFICANT HOSPITAL EVENTS  3/19: TNK administered at 1149   INTERIM HISTORY/SUBJECTIVE Wife is at bedside.  Pt lying in bed, speech continues to improve, able to name and repeat. Cardiology on board, decreased midodrine dose. BP is improving. On eliquis.    OBJECTIVE  CBC    Component Value Date/Time   WBC 7.0 06/18/2023 0550   RBC 3.83 (L) 06/18/2023 0550   HGB 11.3 (L) 06/18/2023 0550   HGB 12.6 (L) 11/24/2018 0849   HCT 36.1 (L) 06/18/2023 0550   HCT 38.7 11/24/2018 0849   PLT 244 06/18/2023 0550   PLT 82 (LL) 11/24/2018 0849   MCV 94.3 06/18/2023 0550   MCV 91 11/24/2018 0849   MCH 29.5 06/18/2023 0550   MCHC 31.3 06/18/2023 0550   RDW 15.3 06/18/2023 0550   RDW 13.8 11/24/2018 0849   LYMPHSABS 1.9 06/16/2023 1128   MONOABS 1.1 (H) 06/16/2023 1128   EOSABS 0.2 06/16/2023 1128   BASOSABS 0.1 06/16/2023 1128    BMET    Component Value Date/Time   NA 138 06/17/2023 0358   NA 142 04/05/2023 1536   K 4.6 06/17/2023 0358   CL 111 06/17/2023 0358   CO2 21 (L) 06/17/2023 0358   GLUCOSE 95 06/17/2023 0358   BUN 31 (H) 06/17/2023 0358   BUN 32 (H) 04/05/2023 1536   CREATININE 1.91 (H) 06/17/2023 0358   CREATININE 1.25 (H) 12/17/2015 0825   CALCIUM 8.6 (L) 06/17/2023 0358   EGFR 38 (L) 04/05/2023 1536   GFRNONAA 34 (L) 06/17/2023 0358    IMAGING past 24 hours No results found.   Vitals:   06/18/23 0329 06/18/23 0837 06/18/23 1212 06/18/23 1541  BP: (!) 133/110 (!) 128/45 110/87 100/74  Pulse: (!) 116 69 (!) 52 78  Resp: 16 16 16 16   Temp: 98.3 F (36.8 C) (!) 97.5 F (36.4 C) 98 F (36.7 C) 97.9 F (36.6 C)  TempSrc: Oral Oral Oral Oral  SpO2: 98% 100% 100% 100%  Weight:         PHYSICAL EXAM General:  Alert, well-nourished, well-developed patient in no acute distress Psych:  Mood and affect appropriate for situation CV: Regular rate and rhythm on monitor Respiratory:  Regular, unlabored respirations on room  air GI: Abdomen soft and nontender   NEURO:  awake, alert, eyes open, orientated to place, age and people but not to time. Expressive aphasia much improved with occasional hesitation and paraphasic errors. Able to follow all simple commands. Able to repeat and naming 4/4. No gaze palsy, tracking bilaterally, visual field full, PERRL. No significant facial droop. Tongue midline. BUEs against gravity and no drift. LLE 4/5, RLE 4-/5. Sensation symmetrical bilaterally subjectively, b/l FTN intact but slow, gait not tested     ASSESSMENT/Horn  [name] is a 89 y.o. male with history of DM2, HTN, HLD, CAD, combined HF R EF, PAD status post right toes amputation, CKD 3, colon cancer status post colectomy, former smoker, recent influenza infection who presented to ED as an activated code stroke due to aphasia status post unwitnessed fall. TNK administered 05/1909 49. Admitted for full stroke workup and post TNK monitoring.  MRI revealed acute infarct left MCA anterior versus middle division and a superimposed small acute cortical infarct of the right parietal lobe. NIH on Admission:6  Stroke: Bilateral MCA small infarcts s/p TNK, etiology: Likely due to new diagnosed A-fib Code Stroke CT head No acute abnormality.  Interval small chronic left  MCA infarct.  Small vessel disease. ASPECTS 10.    CTA head & neck No LVO. MRI  Left MCA acute infarct and superimposed` small acute cortical infarct contralateral right parietal lobe 24hour post-TNK CTH: Stable acute nonhemorrhagic infarct involving left frontal operculum and anterior insular cortex. Remote lacunar infarcts of left lentiform nucleus and right caudate head 2D Echo: LVEF 25 to 30%, LV with global hypokinesis, severely dilated left atria,  status post TAVR LDL 66 HgbA1c 6.7 UDS negative VTE prophylaxis - SCDs aspirin 81 mg daily and clopidogrel 75 mg daily prior to admission, now on eliquis 2.5mg  bid Therapy recommendations:  No follow up  needed  Disposition: Pending  History of TIA Admitted 02/2021 for TIA due to right M2 high-grade stenosis versus short segment occlusion. MRI negative, echo showed EF of 45 to 50%, LDL 103, A1c 6.9. Placed on aspirin and Brilinta for 30 days then aspirin and Plavix Last follow-up neurology visit noted was 05/13/2021 at [facility] - Reported difficulty tolerating Brilinta with complaints of shortness of breath and after approximately 3 weeks was changed back to aspirin and Plavix by cardiology with resolution of symptoms  Atrial fibrillation, new diagnosis Home Meds: none Atrial fibrillation with PVCs seen on admission EKG Echo today shows severely dilated left atria Continue telemetry monitoring on Eliquis 2.5 twice daily  HFrEF Severe AS s/p TAVR 2018 Home meds: Valla Leaver, Lasix, spironolactone Echo today shows EF 25 to 30%, global hypokinesis, s/p TAVR Echo 02/2023 showed LVEF 25 to 30%  Echo in 02/2021 EF 45 to 50% Cardiology consulted, appreciate help No GDMT yet due to hypotension except Jardiance  History of hypertension Now hypotensive Home meds: Spironolactone, Lasix--held Stable on the low end Avoid hypotension Midodrine 5->2.5 twice daily  Hyperlipidemia Home meds: Zetia 10 mg, Crestor 40 mg LDL 66, goal < 70 Now on Crestor 40 and Zetia 10 Continue statin at discharge  Diabetes type II Uncontrolled Home meds: Semaglutide HgbA1c 6.7, goal < 7.0 CBGs SSI Recommend close follow-up with PCP for better DM control  Tobacco Abuse Patient is a former cigarette smoker  Other Stroke Risk Factors Advanced age PAD s/p R toes amputation 01/2023 Former smoker CAD  Other Active Problems History of colon cancer status post colectomy History of CKD 3 Creatinine 1.99-/2.20--1.91  Hospital day # 2   Robert Plan, MD PhD Stroke Neurology 06/18/2023 5:58 PM

## 2023-06-18 NOTE — Care Management Important Message (Signed)
 Important Message  Patient Details  Name: Robert Horn MRN: [id number] Date of Birth: May 14, 1933   Important Message Given:  Yes - Medicare IM     Dorena Bodo 06/18/2023, 2:35 PM

## 2023-06-18 NOTE — Progress Notes (Signed)
 Progress Note  Patient Name: Robert Horn Date of Encounter: 06/18/2023 Primary Cardiologist: Chilton Si, MD   Subjective   Overnight out to the floor. Patient notes that he feels well. Wife notes that he is more talkative. Started AC with no issues BP and heart rates have increased  Vital Signs    Vitals:   06/18/23 0000 06/18/23 0100 06/18/23 0200 06/18/23 0329  BP: 103/79 114/85 97/74 (!) 133/110  Pulse: 99 91 89 (!) 116  Resp: (!) 23 20 (!) 24 16  Temp: 98.1 F (36.7 C)   98.3 F (36.8 C)  TempSrc: Oral   Oral  SpO2: 94% 97% 98% 98%  Weight:        Intake/Output Summary (Last 24 hours) at 06/18/2023 0827 Last data filed at 06/18/2023 0200 Gross per 24 hour  Intake 104.45 ml  Output 850 ml  Net -745.55 ml   Filed Weights   06/16/23 1100  Weight: 98.6 kg    Physical Exam   GEN: No acute distress.   Neck: No JVD Cardiac: iRRR, no  rubs, or gallops. Systolic murmur noted Respiratory: Clear to auscultation bilaterally. GI: Soft, nontender, non-distended  Prior amputations distal LE well dressed  Labs   Telemetry: AF Ratse ~ 100   Chemistry Recent Labs  Lab 06/16/23 1128 06/16/23 1142 06/17/23 0358  NA 137 140 138  K 4.2 4.2 4.6  CL 107 112* 111  CO2 17*  --  21*  GLUCOSE 127* 122* 95  BUN 33* 35* 31*  CREATININE 1.99* 2.20* 1.91*  CALCIUM 9.2  --  8.6*  PROT 7.4  --   --   ALBUMIN 3.4*  --   --   AST 36  --   --   ALT 30  --   --   ALKPHOS 47  --   --   BILITOT 0.9  --   --   GFRNONAA 32*  --  34*  ANIONGAP 13  --  6     Hematology Recent Labs  Lab 06/16/23 1128 06/16/23 1142 06/16/23 2248 06/17/23 0358 06/18/23 0550  WBC 6.4  --   --  7.7 7.0  RBC 4.50  --   --  3.77* 3.83*  HGB 13.1   < > 11.2* 11.0* 11.3*  HCT 42.0   < > 34.8* 34.7* 36.1*  MCV 93.3  --   --  92.0 94.3  MCH 29.1  --   --  29.2 29.5  MCHC 31.2  --   --  31.7 31.3  RDW 14.8  --   --  14.9 15.3  PLT 234  --   --  PLATELET CLUMPS NOTED ON SMEAR, UNABLE  TO ESTIMATE 244   < > = values in this interval not displayed.    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 Mild elevation right heart pressures PCWP 13 mmHg Cardiac output 4.3 L/min CI 1.95  Recommendations: Continue medical management of non-ischemic cardiomyopathy. He has been adequately diuresed.  Findings Coronary Findings Diagnostic  Dominance: Co-dominant  Left Anterior Descending  Third Diagonal Branch Vessel is small in size.  Intervention  No interventions have been documented.   CARDIAC CATHETERIZATION  CARDIAC CATHETERIZATION 11/14/2015  Narrative  There is moderate to severe left ventricular systolic dysfunction.  LV end diastolic pressure is moderately elevated.  The left  ventricular ejection fraction is 35-45% by visual estimate.  There is moderate aortic valve stenosis. There is trivial (1+) aortic regurgitation.   Calcific aortic valve disease with mild aortic regurgitation and mild to moderate aortic stenosis.  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  Widely patent normal appearing coronary arteries.  Estimated left ventricular ejection fraction is 30-35% with elevated end-diastolic pressure and global hypokinesis. Findings are compatible with nonischemic cardiomyopathy  Mild pulmonary hypertension  RECOMMENDATIONS:   Optimize medical therapy  Findings Coronary Findings Diagnostic  Dominance: Co-dominant  Left Anterior Descending  Third Diagonal Branch Vessel is small in size.  Intervention  No interventions have been documented.     ECHOCARDIOGRAM  ECHOCARDIOGRAM COMPLETE 06/17/2023  Narrative ECHOCARDIOGRAM  REPORT    Patient Name:   Robert Horn Date of Exam: 06/17/2023 Medical Rec #:  [id number]     Height:       72.0 in Accession #:    [id number]    Weight:       217.4 lb Date of Birth:  23-Oct-1933     BSA:          2.207 m Patient Age:    89 years      BP:           99/69 mmHg Patient Gender: M             HR:           100 bpm. Exam Location:  Inpatient  Procedure: 2D Echo, Cardiac Doppler, Color Doppler and Intracardiac Opacification Agent (Both Spectral and Color Flow Doppler were utilized during procedure).  Indications:    Stroke  History:        Patient has prior history of Echocardiogram examinations, most recent 03/09/2023. TIA, Aortic Valve Disease; Risk Factors:Hypertension.  Sonographer:    Amy Chionchio Referring Phys: Marvel Plan  IMPRESSIONS   1. Left ventricular ejection fraction, by estimation, is 25 to 30%. The left ventricle has severely decreased function. The left ventricle demonstrates global hypokinesis. The left ventricular internal cavity size was mildly dilated. Left ventricular diastolic function could not be evaluated. 2. Right ventricular systolic function reduced. The right ventricular size is normal. 3. Left atrial size was severely dilated. 4. Right atrial size was moderately dilated. 5. A small pericardial effusion is present. The pericardial effusion is posterior to the left ventricle. There is no evidence of cardiac tamponade. 6. The mitral valve is grossly normal. Mild mitral valve regurgitation. No evidence of mitral stenosis. 7. Tricuspid valve regurgitation is mild to moderate. 8. S/p 26mm Edwards Sapien TAVR (implanted 12/15/2016), well seated, no perivalvular leak, no aortic regurgitation, low flow-low gradient aortic stenosis cannot be ruled out (peak velocity, 2.53m/s, PG , MG , AVA VTI 1.1cm2, DI 0.32, Accleration time , SVi 16). 9. Ascending aorta is structurally normal, with no evidence of dilitation.  Comparison(s):  A prior study was performed on 03/09/2023. No significant change from prior study.  Conclusion(s)/Recommendation(s): No intracardiac source of embolism detected on this transthoracic study. Consider a transesophageal echocardiogram to exclude cardiac source of embolism if clinically indicated. No left ventricular mural or apical thrombus/thrombi.  FINDINGS Left Ventricle: Left ventricular ejection fraction, by estimation, is 25 to 30%. The left ventricle has severely decreased function. The left ventricle demonstrates global hypokinesis. Definity contrast agent was given IV to delineate the left ventricular endocardial borders. The left ventricular internal cavity size was mildly dilated. There is no left ventricular hypertrophy. Left ventricular diastolic  function could not be evaluated due to atrial fibrillation. Left ventricular diastolic function could not be evaluated.  Right Ventricle: The right ventricular size is normal. No increase in right ventricular wall thickness. Right ventricular systolic function reduced.  Left Atrium: Left atrial size was severely dilated.  Right Atrium: Right atrial size was moderately dilated.  Pericardium: A small pericardial effusion is present. The pericardial effusion is posterior to the left ventricle. There is no evidence of cardiac tamponade.  Mitral Valve: The mitral valve is grossly normal. Mild mitral valve regurgitation. No evidence of mitral valve stenosis. MV peak gradient, 4.7 mmHg. The mean mitral valve gradient is 2.0 mmHg.  Tricuspid Valve: The tricuspid valve is normal in structure. Tricuspid valve regurgitation is mild to moderate. No evidence of tricuspid stenosis.  Aortic Valve: S/p 26mm Edwards Sapien TAVR (implanted 12/15/2016), well seated, no perivalvular leak, no aortic regurgitation, low flow-low gradient aortic stenosis cannot be ruled out (peak velocity, 2.64m/s, PG , MG , AVA VTI 1.1cm2, DI 0.32, Accleration time  , SVi 16). Aortic valve mean gradient measures 12.0 mmHg. Aortic valve peak gradient measures 20.1 mmHg. Aortic valve area, by VTI measures 1.10 cm. There is a bioprosthetic valve present in the aortic position.  Pulmonic Valve: The pulmonic valve was normal in structure. Pulmonic valve regurgitation is trivial. No evidence of pulmonic stenosis.  Aorta: Ascending aorta is structurally normal, with no evidence of dilitation. The aortic root was not well visualized.  IAS/Shunts: There is right bowing of the interatrial septum, suggestive of elevated left atrial pressure. The atrial septum is grossly normal.   LEFT VENTRICLE PLAX 2D LVIDd:         6.10 cm LVIDs:         5.70 cm LV PW:         0.80 cm LV IVS:        0.80 cm LVOT diam:     2.10 cm LV SV:         35 LV SV Index:   16 LVOT Area:     3.46 cm  LV Volumes (MOD) LV vol d, MOD A2C: 190.0 ml LV vol d, MOD A4C: 218.0 ml LV vol s, MOD A2C: 164.0 ml LV vol s, MOD A4C: 105.0 ml LV SV MOD A2C:     26.0 ml LV SV MOD A4C:     218.0 ml LV SV MOD BP:      70.5 ml  RIGHT VENTRICLE          IVC RV Basal diam:  4.10 cm  IVC diam: 2.10 cm RV Mid diam:    2.60 cm TAPSE (M-mode): 1.1 cm  LEFT ATRIUM              Index        RIGHT ATRIUM           Index LA Vol (A2C):   174.0 ml 78.83 ml/m  RA Area:     26.90 cm LA Vol (A4C):   86.3 ml  39.10 ml/m  RA Volume:   93.90 ml  42.54 ml/m LA Biplane Vol: 125.0 ml 56.63 ml/m AORTIC VALVE                     PULMONIC VALVE AV Area (Vmax):    1.00 cm      PV Vmax:       1.03 m/s AV Area (Vmean):   0.99 cm      PV Peak grad:  4.2 mmHg AV Area (VTI):     1.10 cm AV Vmax:           224.00 cm/s AV Vmean:          159.400 cm/s AV VTI:            0.317 m AV Peak Grad:      20.1 mmHg AV Mean Grad:      12.0 mmHg LVOT Vmax:         64.35 cm/s LVOT Vmean:        45.550 cm/s LVOT VTI:          0.101 m LVOT/AV VTI ratio: 0.32  AORTA Ao Asc diam: 3.30 cm  MITRAL VALVE MV Area  (PHT): 2.02 cm   SHUNTS MV Area VTI:   1.60 cm   Systemic VTI:  0.10 m MV Peak grad:  4.7 mmHg   Systemic Diam: 2.10 cm MV Mean grad:  2.0 mmHg MV Vmax:       1.08 m/s MV Vmean:      63.3 cm/s  Sunit Tolia Electronically signed by Tessa Lerner Signature Date/Time: 06/17/2023/5:25:42 PM    Final      CT SCANS  CT CORONARY MORPH W/CTA COR W/SCORE 11/13/2016  Addendum 11/13/2016  4:39 PM ADDENDUM REPORT: 11/13/2016 16:36  CLINICAL DATA:  Aortic stenosis  EXAM: Cardiac TAVR CT  TECHNIQUE: The patient was scanned on a Siemens [id number] scanner. A 120 kV retrospective scan was triggered in the ascending thoracic aorta at 140 HU's. Gantry rotation speed was 250 msecs and collimation was.6 mm. No beta blockade or nitro were given. The 3D data set was reconstructed in 5% intervals of the R-R cycle. Systolic and diastolic phases were analyzed on a dedicated work station using MPR, MIP and VRT modes. The patient received 80 cc of contrast.  FINDINGS: Aortic Valve:  Tri leaflet and calcified with restricted motion  Aorta: Mild calcific atherosclerotic debris with no aneurysm and no coarctation  Sinotubular Junction:  31 mm  Ascending Thoracic Aorta:  36 mm  Aortic Arch:  29 mm  Descending Thoracic Aorta:  28 mm  Sinus of Valsalva Measurements:  Non-coronary:  31 mm  Right -coronary:  30 mm  Left -coronary:  31 mm  Coronary Artery Height above Annulus:  Left Main:  16.4 mm above annulus  Right Coronary:  16.4 mm above annulus  Virtual Basal Annulus Measurements:  Maximum/Minimum Diameter:  27.2 mm x 21.3 mm  Perimeter:  76.5 mm  Area:  449 mm2  Coronary Arteries:  Sufficient height above annulus for deployment  Optimum Fluoroscopic Angle for Delivery: LAO 34 degrees Cranial 22 degrees  IMPRESSION: 1) Calcified tri leaflet aortic valve with annulus 449 mm2 suitable for a 26 mm Sapien 3 valve  2) Optimum angle for deployment LAO 34 degrees Cranial 22  degrees  3) Coronary arteries sufficient height above annulus for deployment. 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 On: 11/13/2016 16:36  Narrative EXAM: OVER-READ INTERPRETATION  CT CHEST  The following report is an over-read performed by radiologist Dr. [facility] on 11/13/2016. This over-read does not include interpretation of cardiac or coronary anatomy or pathology. The coronary calcium score/coronary CTA interpretation by the cardiologist is attached.  COMPARISON:  None.  FINDINGS: Extracardiac findings are described separately under dictation for contemporaneously obtained CTA of the chest, abdomen and pelvis.  IMPRESSION: Please see separate dictation for contemporaneously obtained CTA of the chest, abdomen and pelvis dated 11/13/2016 for full description of relevant extracardiac findings.  Electronically Signed: By: Trudie Reed M.D. On: 11/13/2016 14:56     ______________________________________________________________________________________________      Assessment & Plan   Atrial Fibrillation (AF)  - rate controlled ~ 100 presently; if BP continues to improve likely start metoprolol succinate 25 mg PO daily  Stroke- no issues with AC start 06/17/23, co-management with stroke; no further hematuria; despite PAD and prior TAVR, given bleeding risks likely DOAC monotherapy unless stroke feels additional benefit of plavix is worth risk of repeat hematuria  HLD- on zetia and statin  Chronic Combined Biventricular Failure, NYHA I, euvolemic, off lasix, BB, SGLT2i, ARNI or MRA, will trial SGLT2i today likely will only tolerate SGLT2i, MRA and succinate (no coreg) at DC unless remarkable improvement in BP  Hypotension- will attempt to  decrease midodrine today to 2.5 dose.  If symptomatic hypotension return to 5 mg dose  S/p TAVR- no evidence of PVL or HALT  PAD - secondary prevention as above, care principally managed at [facility]  Answered questions with wife and length   For questions or updates, please contact [facility] Please consult [email or web address] for contact info under Cardiology/STEMI.      Riley Lam, MD FASE [facility] Cardiologist [facility]  [address] [place], [address] [place], [place] [address] [phone]  8:27 AM

## 2023-06-18 NOTE — Plan of Care (Signed)
  Problem: Education: Goal: Knowledge of disease or condition will improve Outcome: Progressing   Problem: Ischemic Stroke/TIA Tissue Perfusion: Goal: Complications of ischemic stroke/TIA will be minimized Outcome: Progressing   Problem: Coping: Goal: Will identify appropriate support needs Outcome: Progressing   Problem: Health Behavior/Discharge Planning: Goal: Ability to manage health-related needs will improve Outcome: Progressing Goal: Goals will be collaboratively established with patient/family Outcome: Progressing   Problem: Self-Care: Goal: Ability to participate in self-care as condition permits will improve Outcome: Progressing Goal: Ability to communicate needs accurately will improve Outcome: Progressing   Problem: Nutrition: Goal: Dietary intake will improve Outcome: Progressing   Problem: Coping: Goal: Ability to adjust to condition or change in health will improve Outcome: Progressing

## 2023-06-18 NOTE — Discharge Instructions (Signed)

## 2023-06-18 NOTE — Telephone Encounter (Signed)
 Pt is currently hospitalized, will cancel CR orientation and pass to RN.

## 2023-06-18 NOTE — Telephone Encounter (Signed)
 Patient Product/process development scientist completed.    The patient is insured through [employer]. Patient has Medicare and is not eligible for a copay card, but may be able to apply for patient assistance or Medicare RX Payment Plan (Patient Must reach out to their plan, if eligible for payment plan), if available.    Ran test claim for Eliquis 2.5 mg and the current 30 day co-pay is $152.71 due to a deductible.   This test claim was processed through [facility]- copay amounts may vary at other pharmacies due to pharmacy/plan contracts, or as the patient moves through the different stages of their insurance plan.     Roland Earl, CPHT Pharmacy Technician III Certified Patient Advocate [facility] Pharm[facility] Patient Advocate Team Direct Number: [phone]  Fax: [phone]

## 2023-06-19 ENCOUNTER — Other Ambulatory Visit (HOSPITAL_COMMUNITY): Payer: Self-pay

## 2023-06-19 DIAGNOSIS — I63413 Cerebral infarction due to embolism of bilateral middle cerebral arteries: Secondary | ICD-10-CM

## 2023-06-19 DIAGNOSIS — I4819 Other persistent atrial fibrillation: Secondary | ICD-10-CM

## 2023-06-19 DIAGNOSIS — I4891 Unspecified atrial fibrillation: Secondary | ICD-10-CM | POA: Diagnosis not present

## 2023-06-19 DIAGNOSIS — I5020 Unspecified systolic (congestive) heart failure: Secondary | ICD-10-CM

## 2023-06-19 DIAGNOSIS — Z7902 Long term (current) use of antithrombotics/antiplatelets: Secondary | ICD-10-CM

## 2023-06-19 DIAGNOSIS — R4701 Aphasia: Secondary | ICD-10-CM

## 2023-06-19 DIAGNOSIS — R29701 NIHSS score 1: Secondary | ICD-10-CM | POA: Diagnosis not present

## 2023-06-19 DIAGNOSIS — Z87891 Personal history of nicotine dependence: Secondary | ICD-10-CM

## 2023-06-19 LAB — GLUCOSE, CAPILLARY
Glucose-Capillary: 96 mg/dL (ref 70–99)
Glucose-Capillary: 121 mg/dL — ABNORMAL HIGH (ref 70–99)
Glucose-Capillary: 128 mg/dL — ABNORMAL HIGH (ref 70–99)
Glucose-Capillary: 134 mg/dL — ABNORMAL HIGH (ref 70–99)

## 2023-06-19 LAB — CBC
HCT: 31.1 % — ABNORMAL LOW (ref 39.0–52.0)
Hemoglobin: 10 g/dL — ABNORMAL LOW (ref 13.0–17.0)
MCH: 29.6 pg (ref 26.0–34.0)
MCHC: 32.2 g/dL (ref 30.0–36.0)
MCV: 92 fL (ref 80.0–100.0)
Platelets: UNDETERMINED 10*3/uL (ref 150–400)
RBC: 3.38 MIL/uL — ABNORMAL LOW (ref 4.22–5.81)
RDW: 15.1 % (ref 11.5–15.5)
WBC: 7.1 10*3/uL (ref 4.0–10.5)
nRBC: 0 % (ref 0.0–0.2)

## 2023-06-19 LAB — BASIC METABOLIC PANEL
Anion gap: 8 (ref 5–15)
BUN: 26 mg/dL — ABNORMAL HIGH (ref 8–23)
CO2: 20 mmol/L — ABNORMAL LOW (ref 22–32)
Calcium: 8.8 mg/dL — ABNORMAL LOW (ref 8.9–10.3)
Chloride: 112 mmol/L — ABNORMAL HIGH (ref 98–111)
Creatinine, Ser: 1.6 mg/dL — ABNORMAL HIGH (ref 0.61–1.24)
GFR, Estimated: 41 mL/min — ABNORMAL LOW (ref >60)
Glucose, Bld: 103 mg/dL — ABNORMAL HIGH (ref 70–99)
Potassium: 4.5 mmol/L (ref 3.5–5.1)
Sodium: 140 mmol/L (ref 135–145)

## 2023-06-19 MED ORDER — MIDODRINE HCL 2.5 MG PO TABS
2.5000 mg | ORAL_TABLET | Freq: Two times a day (BID) | ORAL | 1 refills | Status: DC
  Filled 2023-06-19: qty 60, 30d supply, fill #0

## 2023-06-19 MED ORDER — METOPROLOL SUCCINATE ER 25 MG PO TB24
25.0000 mg | ORAL_TABLET | Freq: Every day | ORAL | 1 refills | Status: DC
  Filled 2023-06-19: qty 30, 30d supply, fill #0

## 2023-06-19 MED ORDER — METOPROLOL SUCCINATE ER 25 MG PO TB24
25.0000 mg | ORAL_TABLET | Freq: Every day | ORAL | Status: DC
  Administered 2023-06-19 – 2023-06-21 (×3): 25 mg via ORAL
  Filled 2023-06-19 (×3): qty 1

## 2023-06-19 MED ORDER — APIXABAN 2.5 MG PO TABS
2.5000 mg | ORAL_TABLET | Freq: Two times a day (BID) | ORAL | 1 refills | Status: DC
  Filled 2023-06-19: qty 60, 30d supply, fill #0

## 2023-06-19 NOTE — Progress Notes (Signed)
 Physical Therapy Treatment Patient Details Name: Robert Horn MRN: [id number] DOB: 07-31-1933 Today's Date: 06/19/2023   History of Present Illness Pt adm 3/19 with aphasia.  MRI: Acute infarct in the Left MCA anterior versus middle division,  patchy involvement of a roughly 5 cm area of the anterior insula and  operculum.  2. Superimposed small acute cortical infarct also in the  contralateral Right parietal lobe. PMH includes: rt transmet amputation. colon cancer, CHF, CKD, CAD, DM II, HTN, PVD, neuropathy.    PT Comments  Pt is progressing towards goals. Pt is at a high risk for falls demonstrating 9/24 on DGI; may benefit from some OPPT once discharged from acute care hospital setting in order to work on high level balance activities. Pt family is present and supportive. Pt is supervision to CGA for sit to stand, gait and stairs without an AD. Pt would improve to mod I with RW for safety. Pt is impulsive with mobility and initially required multiple reminders to slow down which improved during session. Due to pt current functional status, home set up and available assistance at home no recommended skilled physical therapy services at this time on discharge from acute care hospital setting. Will continue to follow in acute setting in order to ensure that pt returns home with decreased risk for falls, injury, re-hospitalization and improved activity tolerance.     If plan is discharge home, recommend the following: Assistance with cooking/housework;Supervision due to cognitive status;Help with stairs or ramp for entrance;Assist for transportation     Equipment Recommendations  Rolling walker (2 wheels)       Precautions / Restrictions Precautions Precautions: Fall Recall of Precautions/Restrictions: Intact Precaution/Restrictions Comments: Watch HR Restrictions Weight Bearing Restrictions Per Provider Order: No     Mobility  Bed Mobility Overal bed mobility: Modified Independent Bed  Mobility: Supine to Sit, Sit to Supine     Supine to sit: Modified independent (Device/Increase time) Sit to supine: Modified independent (Device/Increase time)        Transfers Overall transfer level: Needs assistance Equipment used: None Transfers: Sit to/from Stand, Bed to chair/wheelchair/BSC Sit to Stand: Supervision   Step pivot transfers: Contact guard assist       General transfer comment: CGA for stepping to toilet for stability due to impaired balance.    Ambulation/Gait Ambulation/Gait assistance: Contact guard assist Gait Distance (Feet): 300 Feet Assistive device: None Gait Pattern/deviations: Step-through pattern, Decreased stride length Gait velocity: very quick; improved with cues. Gait velocity interpretation: >4.37 ft/sec, indicative of normal walking speed   General Gait Details: decreased arm swing on R and mild deviation due to R tranmet amputation, slower and with CGA due to quick movements and anterior movement of trunk over BOS placing pt at a high risk for falls anteriorly   Stairs Stairs: Yes Stairs assistance: Contact guard assist Stair Management: One rail Right, Step to pattern, Forwards Number of Stairs: 2 General stair comments: assist for safety    Modified Rankin (Stroke Patients Only) Modified Rankin (Stroke Patients Only) Pre-Morbid Rankin Score: No symptoms Modified Rankin: Moderate disability     Balance Overall balance assessment: Needs assistance Sitting-balance support: Single extremity supported Sitting balance-Leahy Scale: Good     Standing balance support: No upper extremity supported, During functional activity Standing balance-Leahy Scale: Poor Standing balance comment: Pt has poor balance and is at a high risk for falls without an AD during functional mobility.     Standardized Balance Assessment Standardized Balance Assessment : Dynamic Gait Index  Dynamic Gait Index Level Surface: Mild Impairment Change  in Gait Speed: Moderate Impairment Gait with Horizontal Head Turns: Moderate Impairment Gait with Vertical Head Turns: Moderate Impairment Gait and Pivot Turn: Severe Impairment Step Over Obstacle: Moderate Impairment Step Around Obstacles: Moderate Impairment Steps: Mild Impairment Total Score: 9      Communication Communication Communication: Impaired Factors Affecting Communication: Difficulty expressing self;Reduced clarity of speech;Hearing impaired  Cognition Arousal: Alert Behavior During Therapy: WFL for tasks assessed/performed   PT - Cognitive impairments: Awareness, Safety/Judgement, Attention     Following commands: Impaired Following commands impaired: Follows one step commands inconsistently, Follows one step commands with increased time    Cueing Cueing Techniques: Verbal cues, Gestural cues, Tactile cues     General Comments General comments (skin integrity, edema, etc.): Pt HR up to 160's during session. Spouse present.      Pertinent Vitals/Pain Pain Assessment Pain Assessment: No/denies pain     PT Goals (current goals can now be found in the care plan section) Acute Rehab PT Goals Patient Stated Goal: walk, recover PT Goal Formulation: With patient Time For Goal Achievement: 07/16/2023 Potential to Achieve Goals: Good Additional Goals Additional Goal #1: Patient will demonstrate balance adequate for community mobility with DGI score 19/24 or greater. Progress towards PT goals: Progressing toward goals    Frequency    Min 3X/week      PT Plan  Continue with current POC        AM-PAC PT "6 Clicks" Mobility   Outcome Measure  Help needed turning from your back to your side while in a flat bed without using bedrails?: None Help needed moving from lying on your back to sitting on the side of a flat bed without using bedrails?: None Help needed moving to and from a bed to a chair (including a wheelchair)?: A Little Help needed standing up  from a chair using your arms (e.g., wheelchair or bedside chair)?: A Little Help needed to walk in hospital room?: A Little Help needed climbing 3-5 steps with a railing? : A Little 6 Click Score: 20    End of Session Equipment Utilized During Treatment: Gait belt Activity Tolerance: Patient tolerated treatment well Patient left: in bed;with call bell/phone within reach;with bed alarm set;with family/visitor present Nurse Communication: Mobility status PT Visit Diagnosis: Other symptoms and signs involving the nervous system (R29.898)     Time: 1610-9604 PT Time Calculation (min) (ACUTE ONLY): 19 min  Charges:    $Therapeutic Activity: 8-22 mins PT General Charges $$ ACUTE PT VISIT: 1 Visit                    Harrel Carina, DPT, CLT  Acute Rehabilitation Services Office: [phone] (Secure chat preferred)    Claudia Desanctis 06/19/2023, 4:58 PM

## 2023-06-19 NOTE — Plan of Care (Signed)

## 2023-06-19 NOTE — Plan of Care (Signed)
   Problem: Education: Goal: Knowledge of disease or condition will improve Outcome: Progressing Goal: Knowledge of secondary prevention will improve (MUST DOCUMENT ALL) Outcome: Progressing Goal: Knowledge of patient specific risk factors will improve (DELETE if not current risk factor) Outcome: Progressing

## 2023-06-19 NOTE — Progress Notes (Signed)
 Patient Name: Robert Horn Date of Encounter: 06/19/2023 [facility] Cardiologist: Robert Si, MD   Interval Summary  .    Spoke to he and wife.  He still battling with some aspects of aphasia.  He used to see Dr. Verdis Horn then 1 visit with Dr. Eldridge Horn.  Now is seeing Dr. Duke Horn.  Answered several questions. Starting low-dose metoprolol today.  Vital Signs .    Vitals:   06/18/23 2010 06/18/23 2300 06/19/23 0358 06/19/23 0749  BP: 102/79 102/73 109/76 110/83  Pulse: 88 72 (!) 42 75  Resp: 16   17  Temp: 97.9 F (36.6 C) 97.7 F (36.5 C) 97.7 F (36.5 C) 97.7 F (36.5 C)  TempSrc: Oral Axillary Oral Oral  SpO2: 100% 100% 98% 100%  Weight:        Intake/Output Summary (Last 24 hours) at 06/19/2023 0919 Last data filed at 06/19/2023 0748 Gross per 24 hour  Intake --  Output 400 ml  Net -400 ml      06/16/2023   11:00 AM 05/31/2023   10:06 AM 04/05/2023    2:37 PM  Last 3 Weights  Weight (lbs) 217 lb 6 oz 219 lb 6.4 oz 216 lb 1.6 oz  Weight (kg) 98.6 kg 99.519 kg 98.022 kg      Telemetry/ECG    Atrial fibrillation heart rate under reasonable control less than 110.- Personally Reviewed  Physical Exam .   GEN: No acute distress.   Neck: No JVD Cardiac: Irregularly irregular, no murmurs, rubs, or gallops.  Respiratory: Clear to auscultation bilaterally. GI: Soft, nontender, non-distended  MS: No edema  Assessment & Plan .     89 year old with stroke, bilateral MCA infarcts status post TNK likely secondary to atrial fibrillation TAVR biventricular systolic heart failure  Chronic systolic heart failure - Ejection fraction 25 to 30%.  Would not tolerate multiple goal-directed medical therapy agents secondary to hypotension and recent stroke.  We have started Toprol-XL 25 mg today.  As outpatient can consider spironolactone 12.5 mg once a day if renal function is stable and he is able to tolerate from a blood pressure perspective.  Currently on  Jardiance as well.  No Entresto at this time. -Nonischemic cardiomyopathy-no evidence of coronary disease on catheterization in December 2024  Persistent atrial fibrillation with frequent ectopy, PVCs - Currently reasonably rate controlled.  Was 115 bpm on March 19. - We will go ahead and start metoprolol succinate 25 mg a day to continue with optimal rate control.  Blood pressure low but reasonable. -After completion of stroke recovery, perhaps 2 to 3 months, could consider rhythm control with DC cardioversion.  Stroke - Anticoagulation started 06/17/2023 been doing well.  No hematuria. -Eliquis 2.5 mg twice a day.  No longer on aspirin or Plavix.  This would increase risks of bleeding.  Hypotension - Has been on midodrine 2.5 mg twice a day  Hyperlipidemia-LDL 66 - On Crestor 40 mg a day and Zetia 10 mg a day with LDL goal of 70  Diabetes with chronic kidney disease stage IIIb-creatinine 1.60 down from 2.2.  Continue to manage diabetes closely.  Jardiance 25 mg a day.  Peripheral arterial disease - Secondary prevention care principally I do not  TAVR 2018 - Doing well with no evidence of perivalvular leak or halt  No new recommendations made.  Will follow-up with Dr. Chilton Horn.  we will go ahead and sign off.  Please let us know if we can be of further  assistance.  Complex medical decision making.  For questions or updates, please contact [facility] Please consult [email or web address] for contact info under        Signed, Donato Schultz, MD

## 2023-06-19 NOTE — Progress Notes (Addendum)
 STROKE TEAM PROGRESS NOTE    SIGNIFICANT HOSPITAL EVENTS  3/19: TNK administered at 1149   INTERIM HISTORY/SUBJECTIVE Wife is at the bedside.  Patient is lying in the bed in no apparent distress No new neurological events overnight  Labs and Vitals Are Stable Neurological exam remains the unchanged and stable  Dr.Shanasia Ibrahim spoke with patient and wife about the potential for enrolling into the sleep smart study.  Patient and wife are interested and would like more information to read up on and let us know their decision to enroll in the study  OBJECTIVE  CBC    Component Value Date/Time   WBC 7.1 06/19/2023 0616   RBC 3.38 (L) 06/19/2023 0616   HGB 10.0 (L) 06/19/2023 0616   HGB 12.6 (L) 11/24/2018 0849   HCT 31.1 (L) 06/19/2023 0616   HCT 38.7 11/24/2018 0849   PLT PLATELET CLUMPS NOTED ON SMEAR, UNABLE TO ESTIMATE 06/19/2023 0616   PLT 82 (LL) 11/24/2018 0849   MCV 92.0 06/19/2023 0616   MCV 91 11/24/2018 0849   MCH 29.6 06/19/2023 0616   MCHC 32.2 06/19/2023 0616   RDW 15.1 06/19/2023 0616   RDW 13.8 11/24/2018 0849   LYMPHSABS 1.9 06/16/2023 1128   MONOABS 1.1 (H) 06/16/2023 1128   EOSABS 0.2 06/16/2023 1128   BASOSABS 0.1 06/16/2023 1128    BMET    Component Value Date/Time   NA 140 06/19/2023 0616   NA 142 04/05/2023 1536   K 4.5 06/19/2023 0616   CL 112 (H) 06/19/2023 0616   CO2 20 (L) 06/19/2023 0616   GLUCOSE 103 (H) 06/19/2023 0616   BUN 26 (H) 06/19/2023 0616   BUN 32 (H) 04/05/2023 1536   CREATININE 1.60 (H) 06/19/2023 0616   CREATININE 1.25 (H) 12/17/2015 0825   CALCIUM 8.8 (L) 06/19/2023 0616   EGFR 38 (L) 04/05/2023 1536   GFRNONAA 41 (L) 06/19/2023 0616    IMAGING past 24 hours No results found.   Vitals:   06/18/23 2300 06/19/23 0358 06/19/23 0749 06/19/23 1156  BP: 102/73 109/76 110/83 103/79  Pulse: 72 (!) 42 75 77  Resp:   17 17  Temp: 97.7 F (36.5 C) 97.7 F (36.5 C) 97.7 F (36.5 C) 98 F (36.7 C)  TempSrc: Axillary Oral Oral  Oral  SpO2: 100% 98% 100% 97%  Weight:         PHYSICAL EXAM General:  Alert, well-nourished, well-developed patient in no acute distress Psych:  Mood and affect appropriate for situation CV: Regular rate and rhythm on monitor Respiratory:  Regular, unlabored respirations on room air GI: Abdomen soft and nontender   NEURO:  awake, alert, eyes open, orientated to place, age and people but not to time. Expressive aphasia much improved with occasional hesitation and paraphasic errors. Able to follow all simple commands. Able to repeat and naming 4/4. No gaze palsy, tracking bilaterally, visual field full, PERRL. No significant facial droop. Tongue midline. BUEs against gravity and no drift. LLE 4/5, RLE 4-/5. Sensation symmetrical bilaterally subjectively, b/l FTN intact but slow, gait not tested    NIHSS 1 premorbid modified Rankin 0 ASSESSMENT/PLAN  Robert Horn is a 89 y.o. male with history of DM2, HTN, HLD, CAD, combined HF R EF, PAD status post right toes amputation, CKD 3, colon cancer status post colectomy, former smoker, recent influenza infection who presented to ED as an activated code stroke due to aphasia status post unwitnessed fall. TNK administered 05/1909 49. Admitted for full stroke workup and  post TNK monitoring.  MRI revealed acute infarct left MCA anterior versus middle division and a superimposed small acute cortical infarct of the right parietal lobe. NIH on Admission:6  Stroke: Bilateral MCA small infarcts s/p TNK, etiology: Likely due to new diagnosed A-fib Code Stroke CT head No acute abnormality.  Interval small chronic left MCA infarct.  Small vessel disease. ASPECTS 10.    CTA head & neck No LVO. MRI  Left MCA acute infarct and superimposed` small acute cortical infarct contralateral right parietal lobe 24hour post-TNK CTH: Stable acute nonhemorrhagic infarct involving left frontal operculum and anterior insular cortex. Remote lacunar infarcts of left  lentiform nucleus and right caudate head 2D Echo: LVEF 25 to 30%, LV with global hypokinesis, severely dilated left atria,  status post TAVR LDL 66 HgbA1c 6.7 UDS negative VTE prophylaxis - SCDs aspirin 81 mg daily and clopidogrel 75 mg daily prior to admission, now on eliquis 2.5mg  bid Therapy recommendations:  No follow up needed  Disposition: Pending  History of TIA Admitted 02/2021 for TIA due to right M2 high-grade stenosis versus short segment occlusion. MRI negative, echo showed EF of 45 to 50%, LDL 103, A1c 6.9. Placed on aspirin and Brilinta for 30 days then aspirin and Plavix Last follow-up neurology visit noted was 05/13/2021 at [facility] - Reported difficulty tolerating Brilinta with complaints of shortness of breath and after approximately 3 weeks was changed back to aspirin and Plavix by cardiology with resolution of symptoms  Atrial fibrillation, new diagnosis Home Meds: none Atrial fibrillation with PVCs seen on admission EKG Echo today shows severely dilated left atria Continue telemetry monitoring on Eliquis 2.5 twice daily  HFrEF Severe AS s/p TAVR 2018 Home meds: Valla Leaver, Lasix, spironolactone Echo today shows EF 25 to 30%, global hypokinesis, s/p TAVR Echo 02/2023 showed LVEF 25 to 30%  Echo in 02/2021 EF 45 to 50% Cardiology consulted, appreciate help No GDMT yet due to hypotension except Jardiance  History of hypertension Now hypotensive Home meds: Spironolactone, Lasix--held Stable on the low end Avoid hypotension Cardiology has added Toprol XL 25 mg Midodrine 5->2.5 twice daily  Hyperlipidemia Home meds: Zetia 10 mg, Crestor 40 mg LDL 66, goal < 70 Now on Crestor 40 and Zetia 10 Continue statin at discharge  Diabetes type II Uncontrolled Home meds: Semaglutide HgbA1c 6.7, goal < 7.0 CBGs SSI Recommend close follow-up with PCP for better DM control  Tobacco Abuse Patient is a former cigarette smoker  Other Stroke Risk  Factors Advanced age PAD s/p R toes amputation 01/2023 Former smoker CAD  Other Active Problems History of colon cancer status post colectomy History of CKD 3 Creatinine 1.99-/2.20--1.91-1.60  Hospital day # 3  Gevena Mart DNP, ACNPC-AG  [facility]  I have personally obtained history,examined this patient, reviewed notes, independently viewed imaging studies, participated in medical decision making and plan of care.ROS completed by me personally and pertinent positives fully documented  I have made any additions or clarifications directly to the above note. Agree with note above.  Patient is doing well but continues to have mild expressive aphasia.  Cardiology was titrating his medications.  Patient also appears to be at risk for obstructive sleep apnea.  He and his wife appear interested participating in sleep smart study and was given written information to review.  Call made clear study participation voluntary and patient will get the same excellent medical care irrespective of whether patient participate in the study or not.  Patient daughter also arrived and was given  information and family wants to participate in the study.  No study specific procedures were done before patient signed informed consent. Greater than 50% time during this 35-minute visit was spent in counseling and coordination of care and discussion patient and wife and daughter and answering questions. Delia Heady, MD Medical Director [facility] Pager: [phone] 06/19/2023 2:33 PM

## 2023-06-20 DIAGNOSIS — I4891 Unspecified atrial fibrillation: Secondary | ICD-10-CM | POA: Diagnosis present

## 2023-06-20 DIAGNOSIS — I959 Hypotension, unspecified: Secondary | ICD-10-CM | POA: Insufficient documentation

## 2023-06-20 DIAGNOSIS — I5020 Unspecified systolic (congestive) heart failure: Secondary | ICD-10-CM | POA: Insufficient documentation

## 2023-06-20 DIAGNOSIS — G4733 Obstructive sleep apnea (adult) (pediatric): Secondary | ICD-10-CM

## 2023-06-20 DIAGNOSIS — I63413 Cerebral infarction due to embolism of bilateral middle cerebral arteries: Secondary | ICD-10-CM | POA: Diagnosis not present

## 2023-06-20 DIAGNOSIS — R29701 NIHSS score 1: Secondary | ICD-10-CM | POA: Diagnosis not present

## 2023-06-20 LAB — GLUCOSE, CAPILLARY
Glucose-Capillary: 102 mg/dL — ABNORMAL HIGH (ref 70–99)
Glucose-Capillary: 121 mg/dL — ABNORMAL HIGH (ref 70–99)
Glucose-Capillary: 123 mg/dL — ABNORMAL HIGH (ref 70–99)
Glucose-Capillary: 120 mg/dL — ABNORMAL HIGH (ref 70–99)

## 2023-06-20 LAB — CBC
HCT: 30 % — ABNORMAL LOW (ref 39.0–52.0)
Hemoglobin: 9.6 g/dL — ABNORMAL LOW (ref 13.0–17.0)
MCH: 29.4 pg (ref 26.0–34.0)
MCHC: 32 g/dL (ref 30.0–36.0)
MCV: 92 fL (ref 80.0–100.0)
Platelets: UNDETERMINED 10*3/uL (ref 150–400)
RBC: 3.26 MIL/uL — ABNORMAL LOW (ref 4.22–5.81)
RDW: 15.2 % (ref 11.5–15.5)
WBC: 6.3 10*3/uL (ref 4.0–10.5)
nRBC: 0 % (ref 0.0–0.2)

## 2023-06-20 LAB — BASIC METABOLIC PANEL
Anion gap: 8 (ref 5–15)
BUN: 26 mg/dL — ABNORMAL HIGH (ref 8–23)
CO2: 19 mmol/L — ABNORMAL LOW (ref 22–32)
Calcium: 8.7 mg/dL — ABNORMAL LOW (ref 8.9–10.3)
Chloride: 112 mmol/L — ABNORMAL HIGH (ref 98–111)
Creatinine, Ser: 1.57 mg/dL — ABNORMAL HIGH (ref 0.61–1.24)
GFR, Estimated: 42 mL/min — ABNORMAL LOW (ref >60)
Glucose, Bld: 96 mg/dL (ref 70–99)
Potassium: 4.7 mmol/L (ref 3.5–5.1)
Sodium: 139 mmol/L (ref 135–145)

## 2023-06-20 NOTE — Discharge Summary (Incomplete)
 Stroke Discharge Summary  Patient ID: Robert Horn   MRN: [id number]      DOB: 06-Jul-1933  Date of Admission: 06/16/2023 Date of Discharge: 06/21/2023  Attending Physician:  Delia Heady MD Consultant(s):    cardiology  Patient's PCP:  Renford Dills, MD  DISCHARGE PRIMARY DIAGNOSIS: Stroke: Bilateral MCA small infarcts s/p TNK, etiology: Likely cardioembolic due to new diagnosed A-fib   Patient Active Problem List   Diagnosis  Date Noted   Atrial fibrillation, new onset (HCC) 06/20/2023   HFrEF (heart failure with reduced ejection fraction) (HCC) 06/20/2023   Hypotension 06/20/2023   Expressive aphasia 06/19/2023   Cerebral infarction due to embolism of bilateral middle cerebral arteries (HCC) 06/16/2023   COVID-19 virus infection 03/16/2021   TIA (transient ischemic attack) 03/12/2021   Diabetes mellitus type 2, controlled (HCC) 03/12/2021   Right foot ulcer, limited to breakdown of skin (HCC) 02/28/2020   Osteomyelitis of second toe of right foot (HCC)    Osteomyelitis of great toe of right foot (HCC)    Severe protein-calorie malnutrition (HCC)    Diabetic polyneuropathy associated with type 2 diabetes mellitus (HCC)    Cutaneous abscess of right foot    Cellulitis of right foot 01/30/2019   CKD (chronic kidney disease), stage III (HCC) 01/30/2019   Elevated LFTs 01/30/2019   Colon cancer (HCC) 01/30/2019   Anemia of chronic disease 01/30/2019   Acute on chronic combined systolic and diastolic CHF (congestive heart failure) (HCC) 03/09/2018   AKI (acute kidney injury) (HCC) 03/09/2018   Colonic mass 12/10/2017   S/P TAVR (transcatheter aortic valve replacement) 03/03/2017   Severe aortic stenosis 12/15/2016   Essential hypertension 05/09/2013   Hyperlipidemia 05/09/2013   PVC's (premature ventricular contractions) 05/09/2013   Chronic combined systolic and diastolic heart failure (HCC) Central and obstructive sleep apnea 05/09/2013   Allergies as of 06/21/2023        Reactions   Ticagrelor Other (See Comments)   Unknown         Medication List     STOP taking these medications    aspirin EC 81 MG tablet   azithromycin 250 MG tablet Commonly known as: ZITHROMAX   carvedilol 3.125 MG tablet Commonly known as: COREG   clopidogrel 75 MG tablet Commonly known as: PLAVIX   Entresto 49-51 MG Generic drug: sacubitril-valsartan   ferrous sulfate 325 (65 FE) MG tablet   furosemide 80 MG tablet Commonly known as: LASIX   omega-3 acid ethyl esters 1 g capsule Commonly known as: LOVAZA   oseltamivir 30 MG capsule Commonly known as: TAMIFLU   oseltamivir 75 MG capsule Commonly known as: TAMIFLU   oxyCODONE-acetaminophen 5-325 MG tablet Commonly known as: PERCOCET/ROXICET   Ozempic (0.25 or 0.5 MG/DOSE) 2 MG/1.5ML Sopn Generic drug: Semaglutide(0.25 or 0.5MG /DOS)   spironolactone 25 MG tablet Commonly known as: ALDACTONE       TAKE these medications    benzonatate 100 MG capsule Commonly known as: TESSALON Take 100 mg by mouth 3 (three) times daily as needed for cough.   diclofenac Sodium 1 % Gel Commonly known as: VOLTAREN Apply 1 Application topically as needed (pain).   Eliquis 2.5 MG Tabs tablet Generic drug: apixaban Take 1 tablet (2.5 mg total) by mouth 2 (two) times daily.   ezetimibe 10 MG tablet Commonly known as: ZETIA Take 1 tablet (10 mg total) by mouth daily.   Jardiance 25 MG Tabs tablet Generic drug: empagliflozin Take 25 mg by mouth daily.  metoprolol succinate 25 MG 24 hr tablet Commonly known as: TOPROL-XL Take 1 tablet (25 mg total) by mouth daily.   midodrine 2.5 MG tablet Commonly known as: PROAMATINE Take 1 tablet (2.5 mg total) by mouth 2 (two) times daily with a meal.   rosuvastatin 40 MG tablet Commonly known as: CRESTOR Take 1 tablet (40 mg total) by mouth daily. What changed: when to take this   VITAMIN B COMPLEX-C PO Take 1 tablet by mouth daily.   Vitamin D 50 MCG  (2000 UT) tablet Take 2,000 Units by mouth daily.        LABORATORY STUDIES CBC    Component Value Date/Time   WBC 6.1 06/21/2023 0754   RBC 3.25 (L) 06/21/2023 0754   HGB 9.7 (L) 06/21/2023 0754   HGB 12.6 (L) 11/24/2018 0849   HCT 30.4 (L) 06/21/2023 0754   HCT 38.7 11/24/2018 0849   PLT PLATELET CLUMPS NOTED ON SMEAR, UNABLE TO ESTIMATE 06/21/2023 0754   PLT 82 (LL) 11/24/2018 0849   MCV 93.5 06/21/2023 0754   MCV 91 11/24/2018 0849   MCH 29.8 06/21/2023 0754   MCHC 31.9 06/21/2023 0754   RDW 15.2 06/21/2023 0754   RDW 13.8 11/24/2018 0849   LYMPHSABS 1.9 06/16/2023 1128   MONOABS 1.1 (H) 06/16/2023 1128   EOSABS 0.2 06/16/2023 1128   BASOSABS 0.1 06/16/2023 1128   CMP    Component Value Date/Time   NA 140 06/21/2023 0754   NA 142 04/05/2023 1536   K 4.3 06/21/2023 0754   CL 111 06/21/2023 0754   CO2 18 (L) 06/21/2023 0754   GLUCOSE 96 06/21/2023 0754   BUN 28 (H) 06/21/2023 0754   BUN 32 (H) 04/05/2023 1536   CREATININE 1.59 (H) 06/21/2023 0754   CREATININE 1.25 (H) 12/17/2015 0825   CALCIUM 9.1 06/21/2023 0754   PROT 7.4 06/16/2023 1128   ALBUMIN 3.4 (L) 06/16/2023 1128   AST 36 06/16/2023 1128   ALT 30 06/16/2023 1128   ALKPHOS 47 06/16/2023 1128   BILITOT 0.9 06/16/2023 1128   GFRNONAA 42 (L) 06/21/2023 0754   GFRAA 49 (L) 08/02/2019 1056   COAGS Lab Results  Component Value Date   INR 1.1 06/16/2023   INR 1.0 03/16/2021   INR 1.0 03/12/2021   Lipid Panel    Component Value Date/Time   CHOL 126 06/17/2023 0358   CHOL 185 05/13/2021 0959   TRIG 103 06/17/2023 0358   HDL 39 (L) 06/17/2023 0358   HDL 58 05/13/2021 0959   CHOLHDL 3.2 06/17/2023 0358   VLDL 21 06/17/2023 0358   LDLCALC 66 06/17/2023 0358   LDLCALC 107 (H) 05/13/2021 0959   HgbA1C  Lab Results  Component Value Date   HGBA1C 6.7 (H) 06/17/2023   Alcohol Level    Component Value Date/Time   ETH <10 06/16/2023 1128     SIGNIFICANT DIAGNOSTIC STUDIES ECHOCARDIOGRAM  COMPLETE Result Date: 06/17/2023    ECHOCARDIOGRAM REPORT   Patient Name:   Decklan Mau Date of Exam: 06/17/2023 Medical Rec #:  [id number]     Height:       72.0 in Accession #:    [id number]    Weight:       217.4 lb Date of Birth:  Jul 04, 1933     BSA:          2.207 m Patient Age:    89 years      BP:           99/69 mmHg  Patient Gender: M             HR:           100 bpm. Exam Location:  Inpatient Procedure: 2D Echo, Cardiac Doppler, Color Doppler and Intracardiac            Opacification Agent (Both Spectral and Color Flow Doppler were            utilized during procedure). Indications:    Stroke  History:        Patient has prior history of Echocardiogram examinations, most                 recent 03/09/2023. TIA, Aortic Valve Disease; Risk                 Factors:Hypertension.  Sonographer:    Amy Chionchio Referring Phys: Marvel Plan IMPRESSIONS  1. Left ventricular ejection fraction, by estimation, is 25 to 30%. The left ventricle has severely decreased function. The left ventricle demonstrates global hypokinesis. The left ventricular internal cavity size was mildly dilated. Left ventricular diastolic function could not be evaluated.  2. Right ventricular systolic function reduced. The right ventricular size is normal.  3. Left atrial size was severely dilated.  4. Right atrial size was moderately dilated.  5. A small pericardial effusion is present. The pericardial effusion is posterior to the left ventricle. There is no evidence of cardiac tamponade.  6. The mitral valve is grossly normal. Mild mitral valve regurgitation. No evidence of mitral stenosis.  7. Tricuspid valve regurgitation is mild to moderate.  8. S/p 26mm Edwards Sapien TAVR (implanted 12/15/2016), well seated, no perivalvular leak, no aortic regurgitation, low flow-low gradient aortic stenosis cannot be ruled out (peak velocity, 2.41m/s, PG , MG , AVA VTI 1.1cm2, DI 0.32, Accleration time , SVi 16).  9. Ascending aorta is  structurally normal, with no evidence of dilitation. Comparison(s): A prior study was performed on 03/09/2023. No significant change from prior study. Conclusion(s)/Recommendation(s): No intracardiac source of embolism detected on this transthoracic study. Consider a transesophageal echocardiogram to exclude cardiac source of embolism if clinically indicated. No left ventricular mural or apical thrombus/thrombi. FINDINGS  Left Ventricle: Left ventricular ejection fraction, by estimation, is 25 to 30%. The left ventricle has severely decreased function. The left ventricle demonstrates global hypokinesis. Definity contrast agent was given IV to delineate the left ventricular endocardial borders. The left ventricular internal cavity size was mildly dilated. There is no left ventricular hypertrophy. Left ventricular diastolic function could not be evaluated due to atrial fibrillation. Left ventricular diastolic function could not be evaluated. Right Ventricle: The right ventricular size is normal. No increase in right ventricular wall thickness. Right ventricular systolic function reduced. Left Atrium: Left atrial size was severely dilated. Right Atrium: Right atrial size was moderately dilated. Pericardium: A small pericardial effusion is present. The pericardial effusion is posterior to the left ventricle. There is no evidence of cardiac tamponade. Mitral Valve: The mitral valve is grossly normal. Mild mitral valve regurgitation. No evidence of mitral valve stenosis. MV peak gradient, 4.7 mmHg. The mean mitral valve gradient is 2.0 mmHg. Tricuspid Valve: The tricuspid valve is normal in structure. Tricuspid valve regurgitation is mild to moderate. No evidence of tricuspid stenosis. Aortic Valve: S/p 26mm Edwards Sapien TAVR (implanted 12/15/2016), well seated, no perivalvular leak, no aortic regurgitation, low flow-low gradient aortic stenosis cannot be ruled out (peak velocity, 2.96m/s, PG , MG , AVA VTI  1.1cm2, DI 0.32, Accleration time , SVi  16). Aortic valve mean gradient measures 12.0 mmHg. Aortic valve peak gradient measures 20.1 mmHg. Aortic valve area, by VTI measures 1.10 cm. There is a bioprosthetic valve present in the aortic position. Pulmonic Valve: The pulmonic valve was normal in structure. Pulmonic valve regurgitation is trivial. No evidence of pulmonic stenosis. Aorta: Ascending aorta is structurally normal, with no evidence of dilitation. The aortic root was not well visualized. IAS/Shunts: There is right bowing of the interatrial septum, suggestive of elevated left atrial pressure. The atrial septum is grossly normal.  LEFT VENTRICLE PLAX 2D LVIDd:         6.10 cm LVIDs:         5.70 cm LV PW:         0.80 cm LV IVS:        0.80 cm LVOT diam:     2.10 cm LV SV:         35 LV SV Index:   16 LVOT Area:     3.46 cm  LV Volumes (MOD) LV vol d, MOD A2C: 190.0 ml LV vol d, MOD A4C: 218.0 ml LV vol s, MOD A2C: 164.0 ml LV vol s, MOD A4C: 105.0 ml LV SV MOD A2C:     26.0 ml LV SV MOD A4C:     218.0 ml LV SV MOD BP:      70.5 ml RIGHT VENTRICLE          IVC RV Basal diam:  4.10 cm  IVC diam: 2.10 cm RV Mid diam:    2.60 cm TAPSE (M-mode): 1.1 cm LEFT ATRIUM              Index        RIGHT ATRIUM           Index LA Vol (A2C):   174.0 ml 78.83 ml/m  RA Area:     26.90 cm LA Vol (A4C):   86.3 ml  39.10 ml/m  RA Volume:   93.90 ml  42.54 ml/m LA Biplane Vol: 125.0 ml 56.63 ml/m  AORTIC VALVE                     PULMONIC VALVE AV Area (Vmax):    1.00 cm      PV Vmax:       1.03 m/s AV Area (Vmean):   0.99 cm      PV Peak grad:  4.2 mmHg AV Area (VTI):     1.10 cm AV Vmax:           224.00 cm/s AV Vmean:          159.400 cm/s AV VTI:            0.317 m AV Peak Grad:      20.1 mmHg AV Mean Grad:      12.0 mmHg LVOT Vmax:         64.35 cm/s LVOT Vmean:        45.550 cm/s LVOT VTI:          0.101 m LVOT/AV VTI ratio: 0.32  AORTA Ao Asc diam: 3.30 cm MITRAL VALVE MV Area (PHT): 2.02 cm   SHUNTS MV  Area VTI:   1.60 cm   Systemic VTI:  0.10 m MV Peak grad:  4.7 mmHg   Systemic Diam: 2.10 cm MV Mean grad:  2.0 mmHg MV Vmax:       1.08 m/s MV Vmean:      63.3 cm/s Sunit  Tolia Electronically signed by Tessa Lerner Signature Date/Time: 06/17/2023/5:25:42 PM    Final    CT HEAD WO CONTRAST ( ) Result Date: 06/17/2023 CLINICAL DATA:  Stroke, follow-up.  24 hour post TNK. EXAM: CT HEAD WITHOUT CONTRAST TECHNIQUE: Contiguous axial images were obtained from the base of the skull through the vertex without intravenous contrast. RADIATION DOSE REDUCTION: This exam was performed according to the departmental dose-optimization program which includes automated exposure control, adjustment of the mA and/or kV according to patient size and/or use of iterative reconstruction technique. COMPARISON:  MR head without contrast 06/17/2023. CT head without contrast 06/16/2023. FINDINGS: Brain: The acute nonhemorrhagic infarct involving the left frontal operculum and anterior insular cortex is stable. No significant expansion of the infarct is present. Moderate atrophy and diffuse white matter disease is stable. Remote lacunar infarcts are present in the left lentiform nucleus right caudate head. The ventricles are proportionate to the degree of atrophy. No significant extraaxial fluid collection is present. The brainstem and cerebellum are within normal limits. Midline structures are within normal limits. Vascular: Atherosclerotic calcifications are present within the cavernous internal carotid arteries bilaterally. No hyperdense vessel is present. Skull: Calvarium is intact. No focal lytic or blastic lesions are present. No significant extracranial soft tissue lesion is present. Sinuses/Orbits: The paranasal sinuses and mastoid air cells are clear. The globes and orbits are within normal limits. IMPRESSION: 1. Stable acute nonhemorrhagic infarct involving the left frontal operculum and anterior insular cortex. 2. Stable moderate  atrophy and diffuse white matter disease. This likely reflects the sequela of chronic microvascular ischemia. 3. Remote lacunar infarcts of the left lentiform nucleus and right caudate head. Electronically Signed   By: Marin Roberts M.D.   On: 06/17/2023 15:50   MR BRAIN WO CONTRAST Result Date: 06/17/2023 CLINICAL DATA:  89 year old male code stroke presentation. Fall. Intracranial atherosclerosis. EXAM: MRI HEAD WITHOUT CONTRAST TECHNIQUE: Multiplanar, multiecho pulse sequences of the brain and surrounding structures were obtained without intravenous contrast. COMPARISON:  CT head, CTA head and neck yesterday. Brain MRI 03/16/2021. FINDINGS: Brain: Cortical, patchy and confluent restricted diffusion in the anterior or middle left MCA division territory. Anterior insula and operculum are affected, in an area of about 5 cm (series 7, image 61). T2 and FLAIR hyperintense cytotoxic edema. No hemorrhagic transformation or mass effect. Contralateral small and solitary appearing cortical area of restricted diffusion in the inferior right parietal lobe series 5, image 82. Minor T2 and FLAIR hyperintensity with no hemorrhage or mass effect. No deep gray nuclei or posterior fossa involvement. Underlying advanced chronic cerebral white matter disease with deep white matter capsule involvement more pronounced in the left corona radiata. Chronic right caudate lacunar infarct. And several small chronic cerebellar lacunar infarcts are new or increased since 2022 (series 10 image 7). No significant chronic cerebral blood products. No midline shift, mass effect, evidence of mass lesion, ventriculomegaly, extra-axial collection or acute intracranial hemorrhage. Cervicomedullary junction and pituitary are within normal limits. Vascular: Major intracranial vascular flow voids are stable since 2022. Skull and upper cervical spine: Partially visible chronically advanced cervical spine degeneration. Skull bone marrow signal is  normal. Sinuses/Orbits: Negative. Other: Mastoids are clear. Visible internal auditory structures appear normal. Negative visible scalp and face. IMPRESSION: 1. Acute infarct in the Left MCA anterior versus middle division, patchy involvement of a roughly 5 cm area of the anterior insula and operculum. 2. Superimposed small acute cortical infarct also in the contralateral Right parietal lobe. 3. No associated hemorrhage or intracranial mass effect.  Underlying advanced chronic small vessel disease. 4. Advanced chronic cervical spine degeneration. Electronically Signed   By: Odessa Fleming M.D.   On: 06/17/2023 04:04   CT C-SPINE NO CHARGE Result Date: 06/16/2023 CLINICAL DATA:  Trauma, fall EXAM: CT CERVICAL SPINE WITHOUT CONTRAST TECHNIQUE: Multidetector CT imaging of the cervical spine was performed without intravenous contrast. Multiplanar CT image reconstructions were also generated. RADIATION DOSE REDUCTION: This exam was performed according to the departmental dose-optimization program which includes automated exposure control, adjustment of the mA and/or kV according to patient size and/or use of iterative reconstruction technique. COMPARISON:  None Available. FINDINGS: Alignment: Straightening and slight reversal of the normal cervical lordosis. No listhesis. No facet subluxation or dislocation. Skull base and vertebrae: No compression fracture or displaced fracture in the cervical spine. Multiple small lucent foci along the endplates likely reflecting degenerative subcortical cystic change. Soft tissues and spinal canal: No prevertebral fluid or swelling. No visible canal hematoma. 2 cm left thyroid nodule. Disc levels: Moderate disc space narrowing at multiple levels. Degenerative endplate osteophytes most pronounced from C3-4 to C5-6. Disc osteophyte complexes at multiple levels. Mild spinal canal stenosis at C3-4 and C4-5. Facet arthrosis at multiple levels. Significant foraminal narrowing at multiple levels  most pronounced at C5-6. Upper chest: Emphysema. Other: None IMPRESSION: No acute fracture or traumatic malalignment of the cervical spine. Degenerative changes as above. 2 cm left thyroid nodule. Recommend nonemergent thyroid ultrasound for further evaluation. Aortic Atherosclerosis (ICD10-I70.0) and Emphysema (ICD10-J43.9). Electronically Signed   By: Emily Filbert M.D.   On: 06/16/2023 12:37   CT ANGIO HEAD NECK W WO CM (CODE STROKE) Result Date: 06/16/2023 CLINICAL DATA:  Code stroke, expressive aphasia, fall. EXAM: CT ANGIOGRAPHY HEAD AND NECK WITH AND WITHOUT CONTRAST TECHNIQUE: Multidetector CT imaging of the head and neck was performed using the standard protocol during bolus administration of intravenous contrast. Multiplanar CT image reconstructions and MIPs were obtained to evaluate the vascular anatomy. Carotid stenosis measurements (when applicable) are obtained utilizing NASCET criteria, using the distal internal carotid diameter as the denominator. RADIATION DOSE REDUCTION: This exam was performed according to the departmental dose-optimization program which includes automated exposure control, adjustment of the mA and/or kV according to patient size and/or use of iterative reconstruction technique. CONTRAST:  60mL OMNIPAQUE IOHEXOL 350 MG/ML SOLN COMPARISON:  Same day CT head.  CTA head and neck 03/16/2021. FINDINGS: CTA NECK FINDINGS Aortic arch: Common origin of the brachiocephalic and left common carotid arteries. Imaged portion shows no evidence of aneurysm or dissection. Mild atherosclerosis of the aortic arch. No significant stenosis of the major arch vessel origins. Pulmonary arteries: As permitted by contrast timing, there are no filling defects in the visualized pulmonary arteries. Subclavian arteries: The subclavian arteries are patent bilaterally. Atherosclerosis at the origin of the left subclavian artery without significant stenosis. Right carotid system: No evidence of dissection,  stenosis (50% or greater), or occlusion. Retropharyngeal course of the distal common carotid artery and proximal cervical ICA. Left carotid system: No evidence of dissection, stenosis (50% or greater), or occlusion. Retropharyngeal course of the distal common carotid artery and proximal cervical ICA. Vertebral arteries: Codominant. No evidence of dissection or occlusion. There is distal tapering and mild narrowing involving the distal V4 segment of the right vertebral artery. No stenosis greater than 50%. Skeleton: No acute findings. Degenerative changes in the cervical spine. Other neck: The visualized airway is patent. No cervical lymphadenopathy. Nodule in the left thyroid lobe measuring up to 2 cm. Upper chest: Emphysema.  Dependent opacities likely reflecting atelectasis. There is mild thickening of the upper thoracic esophageal wall. Prominent superior paratracheal node near the tracheoesophageal groove measuring up to 1.2 cm in short axis. Review of the MIP images confirms the above findings CTA HEAD FINDINGS ANTERIOR CIRCULATION: The intracranial ICAs are patent bilaterally. Atherosclerosis of the carotid siphons. Moderate stenosis at the posterior genu of the left cavernous ICA. Additional mild-to-moderate stenosis of the anterior genu cavernous and supraclinoid right ICA. No proximal occlusion, aneurysm, or vascular malformation. MCAs: The middle cerebral arteries are patent bilaterally. There is moderate stenosis of a proximal M2 inferior division branch of the right MCA without occlusion. Early branching of the left M1 segment. ACAs: The anterior cerebral arteries are patent bilaterally. POSTERIOR CIRCULATION: No significant stenosis, proximal occlusion, aneurysm, or vascular malformation. PCAs: Patent bilaterally.  Fetal origin of the left PCA. Pcomm: Visualized on the left. SCAs: The superior cerebellar arteries are patent bilaterally. Basilar artery: Patent AICAs: Not well visualized. PICAs: Patent  Vertebral arteries: The intracranial vertebral arteries are patent. Venous sinuses: Not well visualized due to contrast timing. Anatomic variants: Fetal origin of the left PCA. Review of the MIP images confirms the above findings IMPRESSION: No large vessel occlusion. Multifocal atherosclerosis as above. Moderate stenosis at the posterior genu of the left cavernous ICA. Additional mild-to-moderate stenosis at the anterior genu of the right cavernous ICA and right supraclinoid ICA. Focal moderate stenosis of a proximal M2 inferior division branch of the right MCA. Wall thickening of the upper thoracic esophagus. Adjacent mildly enlarged upper paratracheal lymph node. Recommend correlation with history of esophagitis. Consider dedicated CT chest for further evaluation. Left thyroid nodule measures up to 2 cm. Recommend correlation with nonemergent thyroid ultrasound. Aortic Atherosclerosis (ICD10-I70.0) and Emphysema (ICD10-J43.9). These results were communicated to Dr. Otelia Limes At 12:05 pm on 06/16/2023 by phone call. Electronically Signed   By: Emily Filbert M.D.   On: 06/16/2023 12:30   CT HEAD CODE STROKE WO CONTRAST Result Date: 06/16/2023 CLINICAL DATA:  Code stroke. Neuro deficit, acute, stroke suspected. Aphasia. Fall. EXAM: CT HEAD WITHOUT CONTRAST TECHNIQUE: Contiguous axial images were obtained from the base of the skull through the vertex without intravenous contrast. RADIATION DOSE REDUCTION: This exam was performed according to the departmental dose-optimization program which includes automated exposure control, adjustment of the mA and/or kV according to patient size and/or use of iterative reconstruction technique. COMPARISON:  Head CT 03/17/2021 and MRI 03/16/2021 FINDINGS: Brain: There is no evidence of an acute infarct, intracranial hemorrhage, mass, midline shift, or extra-axial fluid collection. Patchy to confluent hypodensities in the cerebral white matter bilaterally have mildly progressed and  are nonspecific but compatible with moderate to severe chronic small vessel ischemic disease. A small cortical infarct at the anterior aspect of the left insula and frontal operculum is new but chronic in appearance. There is an unchanged small chronic infarct involving the left basal ganglia and corona radiata. There is mild cerebral atrophy. Vascular: Calcified atherosclerosis at the skull base. No hyperdense vessel. Skull: No acute fracture or suspicious lesion. Sinuses/Orbits: Visualized paranasal sinuses and mastoid air cells are clear. Unremarkable orbits. Other: None. ASPECTS ([place] Stroke Program Early CT Score) - Ganglionic level infarction (caudate, lentiform nuclei, internal capsule, insula, M1-M3 cortex): 7 - Supraganglionic infarction (M4-M6 cortex): 3 Total score (0-10 with 10 being normal): 10 These results were communicated to Dr. Otelia Limes at 11:48 am on 06/16/2023 by text page via the [facility] messaging system. IMPRESSION: 1. No evidence of acute intracranial abnormality. ASPECTS  of 10. 2. Moderate to severe chronic small vessel ischemic disease. 3. Interval small chronic left MCA infarct. Electronically Signed   By: Sebastian Ache M.D.   On: 06/16/2023 11:51       HISTORY OF PRESENT ILLNESS 89 y.o. patient with history of DM2, HTN, HLD, CAD, combined HF R EF, PAD status post right toes amputation, CKD 3, colon cancer status post colectomy, former smoker, recent influenza infection who presented to ED as an activated code stroke due to aphasia status post unwitnessed fall. TNK administered 05/1909 49. Admitted for full stroke workup and post TNK monitoring. MRI revealed acute infarct left MCA anterior versus middle division and a superimposed small acute cortical infarct of the right parietal lobe. He was found to have a new diagnosis of afib and his being discharged home on Eliquis 2.5mg  BID.   HOSPITAL COURSE Stroke: Bilateral MCA small infarcts s/p TNK, etiology: Likely due to new diagnosed  A-fib Code Stroke CT head No acute abnormality.  Interval small chronic left MCA infarct.  Small vessel disease. ASPECTS 10.    CTA head & neck No LVO. MRI  Left MCA acute infarct and superimposed` small acute cortical infarct contralateral right parietal lobe 24hour post-TNK CTH: Stable acute nonhemorrhagic infarct involving left frontal operculum and anterior insular cortex. Remote lacunar infarcts of left lentiform nucleus and right caudate head 2D Echo: LVEF 25 to 30%, LV with global hypokinesis, severely dilated left atria,  status post TAVR LDL 66 HgbA1c 6.7 UDS negative aspirin 81 mg daily and clopidogrel 75 mg daily prior to admission, now on eliquis 2.5mg  bid Therapy recommendations:  HH speech therapy     History of TIA Admitted 02/2021 for TIA due to right M2 high-grade stenosis versus short segment occlusion. MRI negative, echo showed EF of 45 to 50%, LDL 103, A1c 6.9. Placed on aspirin and Brilinta for 30 days then aspirin and Plavix Last follow-up neurology visit noted was 05/13/2021 at [facility] - Reported difficulty tolerating Brilinta with complaints of shortness of breath and after approximately 3 weeks was changed back to aspirin and Plavix by cardiology with resolution of symptoms   Atrial fibrillation, new diagnosis Home Meds: none Atrial fibrillation with PVCs seen on admission EKG Echo today shows severely dilated left atria on Eliquis 2.5 twice daily   HFrEF Severe AS s/p TAVR 2018 Home meds: Valla Leaver, Lasix, spironolactone Echo today shows EF 25 to 30%, global hypokinesis, s/p TAVR Echo 02/2023 showed LVEF 25 to 30%  Echo in 02/2021 EF 45 to 50% GDMT: Jardiance, metoprolol    History of hypertension Now hypotensive Home meds: Spironolactone, Lasix--held Stable on the low end Avoid hypotension Cardiology has added Toprol XL 25 mg Midodrine 5->2.5 twice daily   Hyperlipidemia Home meds: Zetia 10 mg, Crestor 40 mg LDL 66, goal < 70 Now on Crestor 40  and Zetia 10 Continue statin at discharge   Diabetes type II Uncontrolled Home meds: Semaglutide HgbA1c 6.7, goal < 7.0 Recommend close follow-up with PCP for better DM control   Tobacco Abuse Patient is a former cigarette smoker   Other Stroke Risk Factors Advanced age PAD s/p R toes amputation 01/2023 Former smoker CAD OSA/Central Apnea- did not qualify for Sleep Smart Study due to central apnea- will need outpatient follow up after discharge    Other Active Problems History of colon cancer status post colectomy History of CKD 3 Creatinine 1.99-/2.20--1.91-1.60 ->1.57 Stable at discharge  DISCHARGE EXAM  PHYSICAL EXAM General:  Alert, well-nourished, well-developed patient  in no acute distress Psych:  Mood and affect appropriate for situation CV: Regular rate and rhythm on monitor Respiratory:  Regular, unlabored respirations on room air GI: Abdomen soft and nontender     NEURO:  Awake, alert, eyes open, orientated to place, age and people but not to time.  Expressive aphasia much improved with occasional hesitation and paraphasic errors. Able to follow all simple commands. Able to repeat and naming 4/4.  No gaze palsy, tracking bilaterally, visual field full, PERRL. No significant facial droop. Tongue midline.  BUEs against gravity and no drift. LLE 4/5, RLE 4/5. Sensation symmetrical bilaterally subjectively, b/l FTN intact but slow Gait not tested   1a Level of Conscious.: 0 1b LOC Questions: 0 1c LOC Commands: 0 2 Best Gaze: 0 3 Visual: 0 4 Facial Palsy: 0 5a Motor Arm - left: 0 5b Motor Arm - Right: 0 6a Motor Leg - Left: 0 6b Motor Leg - Right: 0 7 Limb Ataxia: 0 8 Sensory: 0 9 Best Language: 1 10 Dysarthria: 0 11 Extinct. and Inatten.: 0 TOTAL: 1   Discharge Diet       Diet   Diet heart healthy/carb modified Room service appropriate? Yes with Assist; Fluid consistency: Thin   liquids  DISCHARGE PLAN Disposition: Home with HH speech therapy   Eliquis (apixaban) daily for secondary stroke prevention Ongoing stroke risk factor control by Primary Care Physician at time of discharge Follow-up PCP Renford Dills, MD in 2 weeks. Follow up with Dr. Duke Salvia as soon as possible after discharge Follow-up in [facility] in 8 weeks, office to schedule an appointment. Able to see NP in clinic. Follow-up in [facility] in 8 weeks with Sleep provider for management of central and obstructive sleep apnea  50 minutes were spent preparing discharge.  Gevena Mart DNP, ACNPC-AG  [facility]  I have personally obtained history,examined this patient, reviewed notes, independently viewed imaging studies, participated in medical decision making and plan of care.ROS completed by me personally and pertinent positives fully documented  I have made any additions or clarifications directly to the above note. Agree with note above.  Patient presented with bilateral embolic infarcts due to new onset A-fib and did very well after thrombolysis with IV TNK.  He was also diagnosed with obstructive and central sleep apnea and signed consent to participate in the sleep smart study but was a screen failure due to having significant central sleep apnea component.  Will be referred to sleep physician for outpatient treatment for the same.  Delia Heady, MD Medical Director [facility] Pager: [phone] 06/21/2023 3:01 PM

## 2023-06-20 NOTE — Plan of Care (Signed)
  Problem: Education: Goal: Knowledge of disease or condition will improve 06/20/2023 0633 by Dahlia Bailiff, RN Outcome: Progressing 06/19/2023 2151 by Dahlia Bailiff, RN Outcome: Progressing Goal: Knowledge of secondary prevention will improve (MUST DOCUMENT ALL) 06/20/2023 0633 by Dahlia Bailiff, RN Outcome: Progressing 06/19/2023 2151 by Dahlia Bailiff, RN Outcome: Progressing Goal: Knowledge of patient specific risk factors will improve (DELETE if not current risk factor) 06/20/2023 0633 by Dahlia Bailiff, RN Outcome: Progressing 06/19/2023 2151 by Dahlia Bailiff, RN Outcome: Progressing

## 2023-06-20 NOTE — Evaluation (Signed)
 Speech Language Pathology Evaluation Patient Details Name: Robert Horn MRN: [id number] DOB: 1933-06-06 Today's Date: 06/20/2023 Time: 4098-1191 SLP Time Calculation (min) (ACUTE ONLY): 25 min  Problem List:  Patient Active Problem List   Diagnosis Date Noted   Atrial fibrillation, new onset (HCC) 06/20/2023   HFrEF (heart failure with reduced ejection fraction) (HCC) 06/20/2023   Hypotension 06/20/2023   Expressive aphasia 06/19/2023   Cerebral infarction due to embolism of bilateral middle cerebral arteries (HCC) 06/16/2023   COVID-19 virus infection 03/16/2021   TIA (transient ischemic attack) 03/12/2021   Diabetes mellitus type 2, controlled (HCC) 03/12/2021   Right foot ulcer, limited to breakdown of skin (HCC) 02/28/2020   Osteomyelitis of second toe of right foot (HCC)    Osteomyelitis of great toe of right foot (HCC)    Severe protein-calorie malnutrition (HCC)    Diabetic polyneuropathy associated with type 2 diabetes mellitus (HCC)    Cutaneous abscess of right foot    Cellulitis of right foot 01/30/2019   CKD (chronic kidney disease), stage III (HCC) 01/30/2019   Elevated LFTs 01/30/2019   Colon cancer (HCC) 01/30/2019   Anemia of chronic disease 01/30/2019   Acute on chronic combined systolic and diastolic CHF (congestive heart failure) (HCC) 03/09/2018   AKI (acute kidney injury) (HCC) 03/09/2018   Colonic mass 12/10/2017   S/P TAVR (transcatheter aortic valve replacement) 03/03/2017   Severe aortic stenosis 12/15/2016   Essential hypertension 05/09/2013   Hyperlipidemia 05/09/2013   PVC's (premature ventricular contractions) 05/09/2013   Chronic combined systolic and diastolic heart failure (HCC) 05/09/2013   Past Medical History:  Past Medical History:  Diagnosis Date   Anemia    Anxiety    situational- surgery   Asthma    as a child   Cancer (HCC) 2019   colon- colectomy    CHF (congestive heart failure) (HCC)    Chronic kidney disease    followed by  Dr. Nehemiah Settle   Coronary artery disease    Diabetes mellitus without complication (HCC)    Type II   Dyslipidemia 10/27/2015   Dyspnea    w/ exertion    Elevated PSA 10/27/2015   Erectile dysfunction 10/27/2015   Heart murmur    Hypertension 10/27/2015   Hypogonadism male 10/27/2015   Neuropathy    Obesity 10/27/2015   Peripheral vascular disease (HCC)    Pneumonia 10/27/2015   pt states was 1982   Rotator cuff tear 10/27/2015   Past Surgical History:  Past Surgical History:  Procedure Laterality Date   ABDOMINAL AORTOGRAM N/A 02/01/2019   Procedure: ABDOMINAL AORTOGRAM;  Surgeon: Cephus Shelling, MD;  Location: [facility] INVASIVE CV LAB;  Service: Cardiovascular;  Laterality: N/A;   AMPUTATION Right 02/03/2019   Procedure: RIGHT FOOT 1ST RAY AMPUTATION;  Surgeon: Nadara Mustard, MD;  Location: [facility] OR;  Service: Orthopedics;  Laterality: Right;   AMPUTATION Right 08/02/2019   Procedure: RIGHT 2ND TOE AMPUTATION;  Surgeon: Nadara Mustard, MD;  Location: [facility] OR;  Service: Orthopedics;  Laterality: Right;   AMPUTATION Right 11/08/2020   Procedure: RIGHT TRANSMETATARSAL AMPUTATION;  Surgeon: Nadara Mustard, MD;  Location: [facility] OR;  Service: Orthopedics;  Laterality: Right;   APPLICATION OF WOUND VAC  11/08/2020   Procedure: APPLICATION OF WOUND VAC;  Surgeon: Nadara Mustard, MD;  Location: [facility] OR;  Service: Orthopedics;;   CARDIAC CATHETERIZATION N/A 11/14/2015   Procedure: Left Heart Cath and Coronary Angiography;  Surgeon: Lyn Records, MD;  Location: [facility] INVASIVE CV LAB;  Service: Cardiovascular;  Laterality: N/A;   COLONOSCOPY     LAPAROSCOPIC PARTIAL COLECTOMY  12/15/2017   LAPAROSCOPIC PARTIAL COLECTOMY (N/A Abdomen)   LAPAROSCOPIC PARTIAL COLECTOMY N/A 12/15/2017   Procedure: LAPAROSCOPIC PARTIAL COLECTOMY;  Surgeon: Harriette Bouillon, MD;  Location: [facility] OR;  Service: General;  Laterality: N/A;   LOWER EXTREMITY ANGIOGRAPHY Right 02/01/2019   Procedure: LOWER EXTREMITY ANGIOGRAPHY;  Surgeon:  Cephus Shelling, MD;  Location: [facility] INVASIVE CV LAB;  Service: Cardiovascular;  Laterality: Right;   PERIPHERAL VASCULAR INTERVENTION Right 02/01/2019   Procedure: PERIPHERAL VASCULAR INTERVENTION;  Surgeon: Cephus Shelling, MD;  Location: [facility] INVASIVE CV LAB;  Service: Cardiovascular;  Laterality: Right;  SFA   RIGHT HEART CATH AND CORONARY ANGIOGRAPHY N/A 03/11/2023   Procedure: RIGHT HEART CATH AND CORONARY ANGIOGRAPHY;  Surgeon: Kathleene Hazel, MD;  Location: [facility] INVASIVE CV LAB;  Service: Cardiovascular;  Laterality: N/A;   TEE WITHOUT CARDIOVERSION N/A 12/15/2016   Procedure: TRANSESOPHAGEAL ECHOCARDIOGRAM (TEE);  Surgeon: Tonny Bollman, MD;  Location: [facility] OR;  Service: Open Heart Surgery;  Laterality: N/A;   TRANSCATHETER AORTIC VALVE REPLACEMENT, TRANSFEMORAL N/A 12/15/2016   Procedure: TRANSCATHETER AORTIC VALVE REPLACEMENT, TRANSFEMORAL;  Surgeon: Tonny Bollman, MD;  Location: [facility] OR;  Service: Open Heart Surgery;  Laterality: N/A;   HPI:  Robert Horn is an 89 y.o. male who presented as a Code Stroke to the ED 06/16/23 via EMS for evaluation of aphasia after an unwitnessed fall at work. TNK administered.  PMHx DM2, HTN, HLD, CAD, PAD, severe aortic stenosis s/p TAVR 2018, combined CHF, CKD IIIb, colon cancer s/p colectomy, right partial foot amputation with wound complications s/p bone shaving 01/2023   Assessment / Plan / Recommendation Clinical Impression  Patient presents with a mixed receptive-expressive aphasia and cognitive impairment as per this evaluation. Of note, he is HOH and does not wear his hearing aides. He was oriented to place and situation but other than stating year as "25", he was not oriented to month, day of week or date. He did correctly state that he had been at the hospital "since Wednesday". He perseverated on stating "25" when SLP asking him what day of the week it was. Even with rephrasing and giving choices, he did not demonstrate comprehension of  this. SLP asked him what month it was and he replied, "says right on the board" but when asked to read what the board said, he was unable. At the beginning of the session, patient was joking around, giving incorrect answers such as telling SLP that his wife was "my sister". He did seem to use humor to deflect from his errors and difficulty with responding to questions. His wife stepped out of the room so he wouldnt be distracted and after that patient was more focued. As evaluation progressed, he seemed to become withdrawn, telling SLP, "ok thats good", seemingly trying to end the conversation. His wife told SLP that at baseline, he would ignore if he was not interested in talking about a particular topic. She asked about things to do to stimulate his brain when he discharges home and inquired about what she has noticed as an ongoing difficulty with his ability to recall numbers, such as his age, phone numbers, etc. SLP recommended that patient start taking a more active role in medication management which his wife currently does. (In addition, she manages the finances) SLP also recommended to focus on a small number of tasks as patient is demonstrating difficulty with maintaining sustained and selective attention. Working  on improving orientation to time can include utilizing a calendar and organizing/scheduling daily tasks and events. SLP will follow while admitted and recommending HH SLP at discharge.    SLP Assessment  SLP Recommendation/Assessment: Patient needs continued Speech Lanaguage Pathology Services SLP Visit Diagnosis: Aphasia (R47.01);Cognitive communication deficit (R41.841)    Recommendations for follow up therapy are one component of a multi-disciplinary discharge planning process, led by the attending physician.  Recommendations may be updated based on patient status, additional functional criteria and insurance authorization.    Follow Up Recommendations  Home health SLP    Assistance  Recommended at Discharge  Intermittent Supervision/Assistance  Functional Status Assessment Patient has had a recent decline in their functional status and demonstrates the ability to make significant improvements in function in a reasonable and predictable amount of time.  Frequency and Duration min 1 x/week  1 week      SLP Evaluation Cognition  Overall Cognitive Status: Impaired/Different from baseline Arousal/Alertness: Awake/alert Orientation Level: Oriented to person;Oriented to place;Disoriented to situation;Oriented to situation Year: 2025 Month: March Day of Week: Incorrect Attention: Sustained Sustained Attention: Impaired Sustained Attention Impairment: Verbal complex Memory: Impaired Memory Impairment: Retrieval deficit Awareness: Impaired Awareness Impairment: Emergent impairment Problem Solving: Impaired Behaviors: Perseveration;Poor frustration tolerance       Comprehension  Auditory Comprehension Overall Auditory Comprehension: Impaired Interfering Components: Processing speed;Hearing EffectiveTechniques: Increased volume;Repetition;Extra processing time Visual Recognition/Discrimination Discrimination: Not tested    Expression Expression Primary Mode of Expression: Verbal Verbal Expression Overall Verbal Expression: Impaired Initiation: No impairment Level of Generative/Spontaneous Verbalization: Sentence;Phrase Pragmatics: Impairment Impairments: Topic maintenance Interfering Components: Attention Effective Techniques: Open ended questions Non-Verbal Means of Communication: Not applicable Written Expression Dominant Hand: Right Written Expression: Not tested   Oral / Motor  Oral Motor/Sensory Function Overall Oral Motor/Sensory Function: Within functional limits Motor Speech Overall Motor Speech: Appears within functional limits for tasks assessed Respiration: Within functional limits Phonation: Normal Resonance: Within functional  limits Articulation: Within functional limitis Intelligibility: Intelligible Motor Planning: Witnin functional limits Motor Speech Errors: Not applicable           Angela Nevin, MA, CCC-SLP Speech Therapy

## 2023-06-20 NOTE — Progress Notes (Signed)
 STROKE TEAM PROGRESS NOTE    SIGNIFICANT HOSPITAL EVENTS  3/19: TNK administered at 1149   INTERIM HISTORY/SUBJECTIVE Wife is at the bedside.  Patient is lying in the bed in no apparent distress No new neurological events overnight Patient did sign consent and is participating with sleep smart study.  He tested positive for obstructive sleep apnea on overnight NOx 3 monitor and will try CPAP mask tolerability tonight.   OBJECTIVE  CBC    Component Value Date/Time   WBC 6.3 06/20/2023 0650   RBC 3.26 (L) 06/20/2023 0650   HGB 9.6 (L) 06/20/2023 0650   HGB 12.6 (L) 11/24/2018 0849   HCT 30.0 (L) 06/20/2023 0650   HCT 38.7 11/24/2018 0849   PLT PLATELET CLUMPS NOTED ON SMEAR, UNABLE TO ESTIMATE 06/20/2023 0650   PLT 82 (LL) 11/24/2018 0849   MCV 92.0 06/20/2023 0650   MCV 91 11/24/2018 0849   MCH 29.4 06/20/2023 0650   MCHC 32.0 06/20/2023 0650   RDW 15.2 06/20/2023 0650   RDW 13.8 11/24/2018 0849   LYMPHSABS 1.9 06/16/2023 1128   MONOABS 1.1 (H) 06/16/2023 1128   EOSABS 0.2 06/16/2023 1128   BASOSABS 0.1 06/16/2023 1128    BMET    Component Value Date/Time   NA 139 06/20/2023 0650   NA 142 04/05/2023 1536   K 4.7 06/20/2023 0650   CL 112 (H) 06/20/2023 0650   CO2 19 (L) 06/20/2023 0650   GLUCOSE 96 06/20/2023 0650   BUN 26 (H) 06/20/2023 0650   BUN 32 (H) 04/05/2023 1536   CREATININE 1.57 (H) 06/20/2023 0650   CREATININE 1.25 (H) 12/17/2015 0825   CALCIUM 8.7 (L) 06/20/2023 0650   EGFR 38 (L) 04/05/2023 1536   GFRNONAA 42 (L) 06/20/2023 0650    IMAGING past 24 hours No results found.   Vitals:   06/19/23 1940 06/19/23 2341 06/20/23 0349 06/20/23 0427  BP: (!) 104/59 116/86 (!) 116/90 (!) 116/90  Pulse: 92 (!) 108  (!) 56  Resp: 18 16  18   Temp: 97.7 F (36.5 C) 97.6 F (36.4 C)  98 F (36.7 C)  TempSrc: Oral Oral  Oral  SpO2: 98% 99%  100%  Weight:         PHYSICAL EXAM General:  Alert, well-nourished, well-developed patient in no acute  distress Psych:  Mood and affect appropriate for situation CV: Regular rate and rhythm on monitor Respiratory:  Regular, unlabored respirations on room air GI: Abdomen soft and nontender   NEURO:  awake, alert, eyes open, orientated to place, age and people but not to time. Expressive aphasia much improved with occasional hesitation and paraphasic errors. Able to follow all simple commands. Able to repeat and naming 4/4. No gaze palsy, tracking bilaterally, visual field full, PERRL. No significant facial droop. Tongue midline. BUEs against gravity and no drift. LLE 4/5, RLE 4-/5. Sensation symmetrical bilaterally subjectively, b/l FTN intact but slow, gait not tested    NIHSS 1 premorbid modified Rankin 0 ASSESSMENT/PLAN  Mr. Fredrick Geoghegan is a 89 y.o. male with history of DM2, HTN, HLD, CAD, combined HF R EF, PAD status post right toes amputation, CKD 3, colon cancer status post colectomy, former smoker, recent influenza infection who presented to ED as an activated code stroke due to aphasia status post unwitnessed fall. TNK administered 05/1909 49. Admitted for full stroke workup and post TNK monitoring.  MRI revealed acute infarct left MCA anterior versus middle division and a superimposed small acute cortical infarct of the right  parietal lobe. NIH on Admission:6  Stroke: Bilateral MCA small infarcts s/p TNK, etiology: Likely due to new diagnosed A-fib Code Stroke CT head No acute abnormality.  Interval small chronic left MCA infarct.  Small vessel disease. ASPECTS 10.    CTA head & neck No LVO. MRI  Left MCA acute infarct and superimposed` small acute cortical infarct contralateral right parietal lobe 24hour post-TNK CTH: Stable acute nonhemorrhagic infarct involving left frontal operculum and anterior insular cortex. Remote lacunar infarcts of left lentiform nucleus and right caudate head 2D Echo: LVEF 25 to 30%, LV with global hypokinesis, severely dilated left atria,  status post  TAVR LDL 66 HgbA1c 6.7 UDS negative VTE prophylaxis - SCDs aspirin 81 mg daily and clopidogrel 75 mg daily prior to admission, now on eliquis 2.5mg  bid Therapy recommendations:  No follow up needed  Disposition: Pending  History of TIA Admitted 02/2021 for TIA due to right M2 high-grade stenosis versus short segment occlusion. MRI negative, echo showed EF of 45 to 50%, LDL 103, A1c 6.9. Placed on aspirin and Brilinta for 30 days then aspirin and Plavix Last follow-up neurology visit noted was 05/13/2021 at [facility] - Reported difficulty tolerating Brilinta with complaints of shortness of breath and after approximately 3 weeks was changed back to aspirin and Plavix by cardiology with resolution of symptoms  Atrial fibrillation, new diagnosis Home Meds: none Atrial fibrillation with PVCs seen on admission EKG Echo today shows severely dilated left atria Continue telemetry monitoring on Eliquis 2.5 twice daily  HFrEF Severe AS s/p TAVR 2018 Home meds: Valla Leaver, Lasix, spironolactone Echo today shows EF 25 to 30%, global hypokinesis, s/p TAVR Echo 02/2023 showed LVEF 25 to 30%  Echo in 02/2021 EF 45 to 50% Cardiology consulted, appreciate help No GDMT yet due to hypotension except Jardiance  History of hypertension Now hypotensive Home meds: Spironolactone, Lasix--held Stable on the low end Avoid hypotension Cardiology has added Toprol XL 25 mg Midodrine 5->2.5 twice daily  Hyperlipidemia Home meds: Zetia 10 mg, Crestor 40 mg LDL 66, goal < 70 Now on Crestor 40 and Zetia 10 Continue statin at discharge  Diabetes type II Uncontrolled Home meds: Semaglutide HgbA1c 6.7, goal < 7.0 CBGs SSI Recommend close follow-up with PCP for better DM control  Tobacco Abuse Patient is a former cigarette smoker  Other Stroke Risk Factors Advanced age PAD s/p R toes amputation 01/2023 Former smoker CAD  Other Active Problems History of colon cancer status post  colectomy History of CKD 3 Creatinine 1.99-/2.20--1.91-1.60  Hospital day # 4    Patient continues to do well but continues to have mild expressive aphasia.Marland Kitchen  He is participating in the sleep smart study and tested positive for obstructive sleep apnea on the overnight NOx 3 monitor.  He will try the CPAP mask tolerability tonight.  Long discussion patient and wife at the bedside and answered questions. Greater than 50% time during this 35-minute visit was spent in counseling and coordination of care and discussion patient and wife and daughter and answering questions. Delia Heady, MD Medical Director [facility] Pager: [phone] 06/20/2023 5:14 PM

## 2023-06-21 ENCOUNTER — Other Ambulatory Visit (HOSPITAL_COMMUNITY): Payer: Self-pay

## 2023-06-21 DIAGNOSIS — I63413 Cerebral infarction due to embolism of bilateral middle cerebral arteries: Secondary | ICD-10-CM | POA: Diagnosis not present

## 2023-06-21 LAB — CBC
HCT: 30.4 % — ABNORMAL LOW (ref 39.0–52.0)
Hemoglobin: 9.7 g/dL — ABNORMAL LOW (ref 13.0–17.0)
MCH: 29.8 pg (ref 26.0–34.0)
MCHC: 31.9 g/dL (ref 30.0–36.0)
MCV: 93.5 fL (ref 80.0–100.0)
Platelets: UNDETERMINED 10*3/uL (ref 150–400)
RBC: 3.25 MIL/uL — ABNORMAL LOW (ref 4.22–5.81)
RDW: 15.2 % (ref 11.5–15.5)
WBC: 6.1 10*3/uL (ref 4.0–10.5)
nRBC: 0 % (ref 0.0–0.2)

## 2023-06-21 LAB — BASIC METABOLIC PANEL
Anion gap: 11 (ref 5–15)
BUN: 28 mg/dL — ABNORMAL HIGH (ref 8–23)
CO2: 18 mmol/L — ABNORMAL LOW (ref 22–32)
Calcium: 9.1 mg/dL (ref 8.9–10.3)
Chloride: 111 mmol/L (ref 98–111)
Creatinine, Ser: 1.59 mg/dL — ABNORMAL HIGH (ref 0.61–1.24)
GFR, Estimated: 42 mL/min — ABNORMAL LOW (ref >60)
Glucose, Bld: 96 mg/dL (ref 70–99)
Potassium: 4.3 mmol/L (ref 3.5–5.1)
Sodium: 140 mmol/L (ref 135–145)

## 2023-06-21 LAB — GLUCOSE, CAPILLARY
Glucose-Capillary: 85 mg/dL (ref 70–99)
Glucose-Capillary: 111 mg/dL — ABNORMAL HIGH (ref 70–99)

## 2023-06-21 NOTE — TOC Transition Note (Addendum)
 Transition of Care [facility]) - Discharge Note   Patient Details  Name: Robert Horn MRN: [id number] Date of Birth: 11/17/1933  Transition of Care [facility]) CM/SW Contact:  Kermit Balo, RN Phone Number: 06/21/2023, 11:17 AM   Clinical Narrative:     Pt is discharging home with his spouse. Pt states his spouse is with him most of the time.  DME at home: walker/ cane/ wheelchair Pt states his wife provides needed transportation and manages his medications. Home health ST arranged with [facility]. Information on the AVS. [facility] will contact him for the first home visit. Wife will transport home.  1332: pt is discharging home on Eliquis. TOC pharmacy will have first 30 days covered with free coupon.   Final next level of care: Home w Home Health Services Barriers to Discharge: No Barriers Identified   Patient Goals and CMS Choice   CMS Medicare.gov Compare Post Acute Care list provided to:: Patient Choice offered to / list presented to : Patient      Discharge Placement                       Discharge Plan and Services Additional resources added to the After Visit Summary for                            [facility] Arranged: Speech Therapy HH Agency: [facility] Date [facility] Agency Contacted: 06/21/23   Representative spoke with at [facility] Agency: Amy  Social Drivers of Health (SDOH) Interventions SDOH Screenings   Food Insecurity: No Food Insecurity (06/16/2023)  Housing: Low Risk  (06/16/2023)  Transportation Needs: No Transportation Needs (06/16/2023)  Utilities: Not At Risk (06/16/2023)  Alcohol Screen: Low Risk  (03/10/2023)  Depression (PHQ2-9): Low Risk  (05/13/2021)  Financial Resource Strain: Low Risk  (03/10/2023)  Social Connections: Moderately Integrated (06/16/2023)  Tobacco Use: Medium Risk (06/16/2023)     Readmission Risk Interventions     No data to display

## 2023-06-21 NOTE — Progress Notes (Signed)
 Occupational Therapy Treatment Patient Details Name: Robert Horn MRN: [id number] DOB: [birth year] Today's Date: 06/21/2023   History of present illness Pt adm 3/19 with aphasia.  MRI: Acute infarct in the Left MCA anterior versus middle division,  patchy involvement of a roughly 5 cm area of the anterior insula and  operculum.  2. Superimposed small acute cortical infarct also in the  contralateral Right parietal lobe. PMH includes: rt transmet amputation. colon cancer, CHF, CKD, CAD, DM II, HTN, PVD, neuropathy.   OT comments  Pt making steady progress towards OT goals this session. Pts biggest deficit seems to be communication however pt was able to follow commands with multimodal cues and increased time. Session focus on BADL reeducation and functional mobility. Pt completed full ADL at levels listed below. No deficits noted in BUE coordination with pt using BUEs appropriately throughout ADLS. Will continue to follow acutely for OT needs however do not anticipate OT f/u at DC.        If plan is discharge home, recommend the following:  Assist for transportation;Direct supervision/assist for medications management   Equipment Recommendations  None recommended by OT    Recommendations for Other Services      Precautions / Restrictions Precautions Precautions: Fall Recall of Precautions/Restrictions: Intact Precaution/Restrictions Comments: Watch HR Restrictions Weight Bearing Restrictions Per Provider Order: No       Mobility Bed Mobility Overal bed mobility: Modified Independent Bed Mobility: Supine to Sit     Supine to sit: Modified independent (Device/Increase time)          Transfers Overall transfer level: Needs assistance Equipment used: None Transfers: Sit to/from Stand Sit to Stand: Supervision           General transfer comment: supervision for safety to rise from EOB and toilet     Balance Overall balance assessment: Needs assistance Sitting-balance  support: No upper extremity supported, Feet supported Sitting balance-Leahy Scale: Good     Standing balance support: No upper extremity supported, During functional activity Standing balance-Leahy Scale: Fair Standing balance comment: able to stand at sink for bathing with no UE support and supervision assist, no LOB                           ADL either performed or assessed with clinical judgement   ADL Overall ADL's : Needs assistance/impaired     Grooming: Wash/dry face;Wash/dry hands;Standing;Cueing for sequencing;Supervision/safety Grooming Details (indicate cue type and reason): initial cues to initiate task but then able to sequence through steps once cued how to start hand hygiene Upper Body Bathing: Set up;Sitting Upper Body Bathing Details (indicate cue type and reason): sitting at sink Lower Body Bathing: Contact guard assist;Sit to/from stand   Upper Body Dressing : Set up;Sitting Upper Body Dressing Details (indicate cue type and reason): to don new gown Lower Body Dressing: Set up;Sitting/lateral leans Lower Body Dressing Details (indicate cue type and reason): to don new socks via figure 4 Toilet Transfer: Contact guard Actor;Ambulation;Grab bars Toilet Transfer Details (indicate cue type and reason): CGA for safety, continent b/b void Toileting- Clothing Manipulation and Hygiene: Contact guard assist;Sit to/from stand       Functional mobility during ADLs: Contact guard assist General ADL Comments: ADL participation impacted by decreased safety awareness and decreased activity tolerance    Extremity/Trunk Assessment Upper Extremity Assessment Upper Extremity Assessment: Overall WFL for tasks assessed   Lower Extremity Assessment Lower Extremity Assessment: RLE deficits/detail RLE Deficits /  Details: R transmet amputation, reports has orthopedic shoe; otherwise WFL   Cervical / Trunk Assessment Cervical / Trunk Assessment: Normal     Vision Baseline Vision/History: 1 Wears glasses Patient Visual Report: No change from baseline     Perception Perception Perception: Within Functional Limits   Praxis Praxis Praxis: WFL   Communication Communication Communication: Impaired Factors Affecting Communication: Difficulty expressing self;Hearing impaired   Cognition Arousal: Alert Behavior During Therapy: WFL for tasks assessed/performed Cognition: Difficult to assess Difficult to assess due to: Impaired communication           OT - Cognition Comments: expressive aphasia but following commands appropriatley with MAX multimodal cues and repetition, also HOH                 Following commands: Impaired Following commands impaired: Follows one step commands with increased time, Follows multi-step commands with increased time      Cueing   Cueing Techniques: Verbal cues, Tactile cues, Gestural cues, Visual cues  Exercises      Shoulder Instructions       General Comments HR up to 159 bpm, back to 114 at end of session    Pertinent Vitals/ Pain       Pain Assessment Pain Assessment: Faces Faces Pain Scale: Hurts a little bit Pain Location: back from fall Pain Descriptors / Indicators: Sore Pain Intervention(s): Monitored during session, Repositioned  Home Living                                          Prior Functioning/Environment              Frequency  Min 1X/week        Progress Toward Goals  OT Goals(current goals can now be found in the care plan section)  Progress towards OT goals: Progressing toward goals  Acute Rehab OT Goals Patient Stated Goal: none stated Time For Goal Achievement: 07/24/2023 Potential to Achieve Goals: Good  Plan      Co-evaluation                 AM-PAC OT "6 Clicks" Daily Activity     Outcome Measure   Help from another person eating meals?: None Help from another person taking care of personal grooming?: A  Little Help from another person toileting, which includes using toliet, bedpan, or urinal?: A Little Help from another person bathing (including washing, rinsing, drying)?: A Little Help from another person to put on and taking off regular upper body clothing?: None Help from another person to put on and taking off regular lower body clothing?: A Little 6 Click Score: 20    End of Session Equipment Utilized During Treatment: Gait belt  OT Visit Diagnosis: Cognitive communication deficit (R41.841) Symptoms and signs involving cognitive functions: Cerebral infarction   Activity Tolerance Patient tolerated treatment well   Patient Left in chair;with call bell/phone within reach   Nurse Communication Mobility status;Other (comment) (nurse aware pt in chair and no more chair alarms available)        Time: [phone] OT Time Calculation (min): 23 min  Charges: OT General Charges $OT Visit: 1 Visit OT Treatments $Self Care/Home Management : 23-37 mins  Lenor Derrick., COTA/L Acute Rehabilitation Services [phone]   Barron Schmid 06/21/2023, 9:54 AM

## 2023-06-21 NOTE — Plan of Care (Signed)
   Problem: Education: Goal: Knowledge of disease or condition will improve Outcome: Progressing Goal: Knowledge of secondary prevention will improve (MUST DOCUMENT ALL) Outcome: Progressing Goal: Knowledge of patient specific risk factors will improve (DELETE if not current risk factor) Outcome: Progressing

## 2023-06-21 NOTE — Plan of Care (Signed)
 Problem: Education: Goal: Knowledge of disease or condition will improve Outcome: Adequate for Discharge Goal: Knowledge of secondary prevention will improve (MUST DOCUMENT ALL) Outcome: Adequate for Discharge Goal: Knowledge of patient specific risk factors will improve (DELETE if not current risk factor) Outcome: Adequate for Discharge   Problem: Ischemic Stroke/TIA Tissue Perfusion: Goal: Complications of ischemic stroke/TIA will be minimized Outcome: Adequate for Discharge   Problem: Coping: Goal: Will verbalize positive feelings about self Outcome: Adequate for Discharge Goal: Will identify appropriate support needs Outcome: Adequate for Discharge   Problem: Health Behavior/Discharge Planning: Goal: Ability to manage health-related needs will improve Outcome: Adequate for Discharge Goal: Goals will be collaboratively established with patient/family Outcome: Adequate for Discharge   Problem: Self-Care: Goal: Ability to participate in self-care as condition permits will improve Outcome: Adequate for Discharge Goal: Verbalization of feelings and concerns over difficulty with self-care will improve Outcome: Adequate for Discharge Goal: Ability to communicate needs accurately will improve Outcome: Adequate for Discharge   Problem: Nutrition: Goal: Risk of aspiration will decrease Outcome: Adequate for Discharge Goal: Dietary intake will improve Outcome: Adequate for Discharge   Problem: Education: Goal: Ability to describe self-care measures that may prevent or decrease complications (Diabetes Survival Skills Education) will improve Outcome: Adequate for Discharge Goal: Individualized Educational Video(s) Outcome: Adequate for Discharge   Problem: Coping: Goal: Ability to adjust to condition or change in health will improve Outcome: Adequate for Discharge   Problem: Fluid Volume: Goal: Ability to maintain a balanced intake and output will improve Outcome: Adequate  for Discharge   Problem: Health Behavior/Discharge Planning: Goal: Ability to identify and utilize available resources and services will improve Outcome: Adequate for Discharge Goal: Ability to manage health-related needs will improve Outcome: Adequate for Discharge   Problem: Metabolic: Goal: Ability to maintain appropriate glucose levels will improve Outcome: Adequate for Discharge   Problem: Nutritional: Goal: Maintenance of adequate nutrition will improve Outcome: Adequate for Discharge Goal: Progress toward achieving an optimal weight will improve Outcome: Adequate for Discharge   Problem: Skin Integrity: Goal: Risk for impaired skin integrity will decrease Outcome: Adequate for Discharge   Problem: Tissue Perfusion: Goal: Adequacy of tissue perfusion will improve Outcome: Adequate for Discharge   Problem: Education: Goal: Knowledge of General Education information will improve Description: Including pain rating scale, medication(s)/side effects and non-pharmacologic comfort measures Outcome: Adequate for Discharge   Problem: Health Behavior/Discharge Planning: Goal: Ability to manage health-related needs will improve Outcome: Adequate for Discharge   Problem: Clinical Measurements: Goal: Ability to maintain clinical measurements within normal limits will improve Outcome: Adequate for Discharge Goal: Will remain free from infection Outcome: Adequate for Discharge Goal: Diagnostic test results will improve Outcome: Adequate for Discharge Goal: Respiratory complications will improve Outcome: Adequate for Discharge Goal: Cardiovascular complication will be avoided Outcome: Adequate for Discharge   Problem: Activity: Goal: Risk for activity intolerance will decrease Outcome: Adequate for Discharge   Problem: Nutrition: Goal: Adequate nutrition will be maintained Outcome: Adequate for Discharge   Problem: Coping: Goal: Level of anxiety will decrease Outcome:  Adequate for Discharge   Problem: Elimination: Goal: Will not experience complications related to bowel motility Outcome: Adequate for Discharge Goal: Will not experience complications related to urinary retention Outcome: Adequate for Discharge   Problem: Pain Managment: Goal: General experience of comfort will improve and/or be controlled Outcome: Adequate for Discharge   Problem: Safety: Goal: Ability to remain free from injury will improve Outcome: Adequate for Discharge   Problem: Skin Integrity: Goal: Risk for  impaired skin integrity will decrease Outcome: Adequate for Discharge

## 2023-06-22 ENCOUNTER — Ambulatory Visit (HOSPITAL_COMMUNITY)

## 2023-06-23 DIAGNOSIS — E1122 Type 2 diabetes mellitus with diabetic chronic kidney disease: Secondary | ICD-10-CM | POA: Diagnosis not present

## 2023-06-23 DIAGNOSIS — I69320 Aphasia following cerebral infarction: Secondary | ICD-10-CM | POA: Diagnosis not present

## 2023-06-23 DIAGNOSIS — E43 Unspecified severe protein-calorie malnutrition: Secondary | ICD-10-CM | POA: Diagnosis not present

## 2023-06-23 DIAGNOSIS — I4891 Unspecified atrial fibrillation: Secondary | ICD-10-CM | POA: Diagnosis not present

## 2023-06-23 DIAGNOSIS — I5042 Chronic combined systolic (congestive) and diastolic (congestive) heart failure: Secondary | ICD-10-CM | POA: Diagnosis not present

## 2023-06-23 DIAGNOSIS — D631 Anemia in chronic kidney disease: Secondary | ICD-10-CM | POA: Diagnosis not present

## 2023-06-23 DIAGNOSIS — N1832 Chronic kidney disease, stage 3b: Secondary | ICD-10-CM | POA: Diagnosis not present

## 2023-06-23 DIAGNOSIS — E1142 Type 2 diabetes mellitus with diabetic polyneuropathy: Secondary | ICD-10-CM | POA: Diagnosis not present

## 2023-06-23 DIAGNOSIS — I130 Hypertensive heart and chronic kidney disease with heart failure and stage 1 through stage 4 chronic kidney disease, or unspecified chronic kidney disease: Secondary | ICD-10-CM | POA: Diagnosis not present

## 2023-06-23 DIAGNOSIS — E1151 Type 2 diabetes mellitus with diabetic peripheral angiopathy without gangrene: Secondary | ICD-10-CM | POA: Diagnosis not present

## 2023-06-25 ENCOUNTER — Telehealth (HOSPITAL_BASED_OUTPATIENT_CLINIC_OR_DEPARTMENT_OTHER): Payer: Self-pay | Admitting: Cardiovascular Disease

## 2023-06-25 DIAGNOSIS — I4891 Unspecified atrial fibrillation: Secondary | ICD-10-CM | POA: Diagnosis not present

## 2023-06-25 NOTE — Telephone Encounter (Signed)
 Pt c/o Shortness Of Breath: STAT if SOB developed within the last 24 hours or pt is noticeably SOB on the phone  1. Are you currently SOB (can you hear that pt is SOB on the phone)? Yes- sounds like a person having an Asthma attack  2. How long have you been experiencing SOB? Since Tuesday, but gotten worse  3. Are you SOB when sitting or when up moving around? both  4. Are you currently experiencing any other symptoms no- wife said patient was discharged from the hospital on?Monday. He said he was taken off all of his his heart medicine- -

## 2023-06-25 NOTE — Telephone Encounter (Signed)
 Transferred from call center,   Patients wife calling, pt recently diagnosed with a stroke. Discharged on Monday from hospital. He has recently been started on Eliquis, all his other heart medications were stopped. He has an appointment on Monday, she wanted to see if Luther Parody could see him today. She endorses that the patient is actively short of breath. Advised due to being actively short of breath patient would need to be seen in ED. She states that they have an appointment today with PCP, encouraged to keep that, but present to ED if symptoms worsen.

## 2023-06-28 ENCOUNTER — Inpatient Hospital Stay (HOSPITAL_COMMUNITY)
Admission: EM | Admit: 2023-06-28 | Discharge: 2023-07-29 | DRG: 682 | Disposition: E | Attending: Internal Medicine | Admitting: Internal Medicine

## 2023-06-28 ENCOUNTER — Telehealth: Payer: Self-pay | Admitting: Cardiovascular Disease

## 2023-06-28 ENCOUNTER — Encounter (HOSPITAL_COMMUNITY): Payer: Self-pay

## 2023-06-28 ENCOUNTER — Observation Stay (HOSPITAL_COMMUNITY)

## 2023-06-28 ENCOUNTER — Other Ambulatory Visit: Payer: Self-pay

## 2023-06-28 ENCOUNTER — Emergency Department (HOSPITAL_COMMUNITY)

## 2023-06-28 ENCOUNTER — Ambulatory Visit (HOSPITAL_COMMUNITY)

## 2023-06-28 ENCOUNTER — Ambulatory Visit (HOSPITAL_BASED_OUTPATIENT_CLINIC_OR_DEPARTMENT_OTHER): Admitting: Family

## 2023-06-28 DIAGNOSIS — H9190 Unspecified hearing loss, unspecified ear: Secondary | ICD-10-CM | POA: Diagnosis present

## 2023-06-28 DIAGNOSIS — I509 Heart failure, unspecified: Secondary | ICD-10-CM

## 2023-06-28 DIAGNOSIS — R531 Weakness: Secondary | ICD-10-CM | POA: Diagnosis present

## 2023-06-28 DIAGNOSIS — I4510 Unspecified right bundle-branch block: Secondary | ICD-10-CM | POA: Diagnosis present

## 2023-06-28 DIAGNOSIS — E11649 Type 2 diabetes mellitus with hypoglycemia without coma: Secondary | ICD-10-CM | POA: Diagnosis not present

## 2023-06-28 DIAGNOSIS — J45909 Unspecified asthma, uncomplicated: Secondary | ICD-10-CM | POA: Diagnosis present

## 2023-06-28 DIAGNOSIS — N390 Urinary tract infection, site not specified: Secondary | ICD-10-CM | POA: Diagnosis present

## 2023-06-28 DIAGNOSIS — I2489 Other forms of acute ischemic heart disease: Secondary | ICD-10-CM | POA: Diagnosis present

## 2023-06-28 DIAGNOSIS — I428 Other cardiomyopathies: Secondary | ICD-10-CM | POA: Diagnosis present

## 2023-06-28 DIAGNOSIS — R7989 Other specified abnormal findings of blood chemistry: Secondary | ICD-10-CM | POA: Diagnosis not present

## 2023-06-28 DIAGNOSIS — E1165 Type 2 diabetes mellitus with hyperglycemia: Secondary | ICD-10-CM | POA: Diagnosis present

## 2023-06-28 DIAGNOSIS — E119 Type 2 diabetes mellitus without complications: Secondary | ICD-10-CM

## 2023-06-28 DIAGNOSIS — Z66 Do not resuscitate: Secondary | ICD-10-CM | POA: Diagnosis present

## 2023-06-28 DIAGNOSIS — Z85038 Personal history of other malignant neoplasm of large intestine: Secondary | ICD-10-CM

## 2023-06-28 DIAGNOSIS — I5042 Chronic combined systolic (congestive) and diastolic (congestive) heart failure: Secondary | ICD-10-CM | POA: Diagnosis not present

## 2023-06-28 DIAGNOSIS — I70201 Unspecified atherosclerosis of native arteries of extremities, right leg: Secondary | ICD-10-CM | POA: Diagnosis present

## 2023-06-28 DIAGNOSIS — Z87891 Personal history of nicotine dependence: Secondary | ICD-10-CM

## 2023-06-28 DIAGNOSIS — I5082 Biventricular heart failure: Secondary | ICD-10-CM | POA: Diagnosis present

## 2023-06-28 DIAGNOSIS — I517 Cardiomegaly: Secondary | ICD-10-CM | POA: Diagnosis not present

## 2023-06-28 DIAGNOSIS — I4891 Unspecified atrial fibrillation: Secondary | ICD-10-CM | POA: Diagnosis not present

## 2023-06-28 DIAGNOSIS — Z8249 Family history of ischemic heart disease and other diseases of the circulatory system: Secondary | ICD-10-CM

## 2023-06-28 DIAGNOSIS — I10 Essential (primary) hypertension: Secondary | ICD-10-CM | POA: Diagnosis not present

## 2023-06-28 DIAGNOSIS — E1142 Type 2 diabetes mellitus with diabetic polyneuropathy: Secondary | ICD-10-CM | POA: Diagnosis present

## 2023-06-28 DIAGNOSIS — G9341 Metabolic encephalopathy: Secondary | ICD-10-CM | POA: Diagnosis present

## 2023-06-28 DIAGNOSIS — D631 Anemia in chronic kidney disease: Secondary | ICD-10-CM | POA: Diagnosis present

## 2023-06-28 DIAGNOSIS — N170 Acute kidney failure with tubular necrosis: Secondary | ICD-10-CM | POA: Diagnosis not present

## 2023-06-28 DIAGNOSIS — I5022 Chronic systolic (congestive) heart failure: Secondary | ICD-10-CM | POA: Diagnosis not present

## 2023-06-28 DIAGNOSIS — E669 Obesity, unspecified: Secondary | ICD-10-CM | POA: Diagnosis present

## 2023-06-28 DIAGNOSIS — E1151 Type 2 diabetes mellitus with diabetic peripheral angiopathy without gangrene: Secondary | ICD-10-CM | POA: Diagnosis present

## 2023-06-28 DIAGNOSIS — N189 Chronic kidney disease, unspecified: Secondary | ICD-10-CM

## 2023-06-28 DIAGNOSIS — N281 Cyst of kidney, acquired: Secondary | ICD-10-CM | POA: Diagnosis not present

## 2023-06-28 DIAGNOSIS — Z79899 Other long term (current) drug therapy: Secondary | ICD-10-CM

## 2023-06-28 DIAGNOSIS — E875 Hyperkalemia: Secondary | ICD-10-CM | POA: Diagnosis not present

## 2023-06-28 DIAGNOSIS — R079 Chest pain, unspecified: Secondary | ICD-10-CM | POA: Diagnosis not present

## 2023-06-28 DIAGNOSIS — J9811 Atelectasis: Secondary | ICD-10-CM | POA: Diagnosis not present

## 2023-06-28 DIAGNOSIS — K7200 Acute and subacute hepatic failure without coma: Secondary | ICD-10-CM | POA: Diagnosis not present

## 2023-06-28 DIAGNOSIS — E1122 Type 2 diabetes mellitus with diabetic chronic kidney disease: Secondary | ICD-10-CM | POA: Diagnosis present

## 2023-06-28 DIAGNOSIS — Z7984 Long term (current) use of oral hypoglycemic drugs: Secondary | ICD-10-CM

## 2023-06-28 DIAGNOSIS — R112 Nausea with vomiting, unspecified: Secondary | ICD-10-CM | POA: Diagnosis present

## 2023-06-28 DIAGNOSIS — D638 Anemia in other chronic diseases classified elsewhere: Secondary | ICD-10-CM | POA: Diagnosis not present

## 2023-06-28 DIAGNOSIS — R001 Bradycardia, unspecified: Secondary | ICD-10-CM | POA: Diagnosis not present

## 2023-06-28 DIAGNOSIS — J189 Pneumonia, unspecified organism: Secondary | ICD-10-CM | POA: Diagnosis not present

## 2023-06-28 DIAGNOSIS — N179 Acute kidney failure, unspecified: Secondary | ICD-10-CM | POA: Diagnosis present

## 2023-06-28 DIAGNOSIS — R092 Respiratory arrest: Secondary | ICD-10-CM | POA: Diagnosis not present

## 2023-06-28 DIAGNOSIS — E872 Acidosis, unspecified: Secondary | ICD-10-CM | POA: Diagnosis present

## 2023-06-28 DIAGNOSIS — Z833 Family history of diabetes mellitus: Secondary | ICD-10-CM

## 2023-06-28 DIAGNOSIS — N186 End stage renal disease: Secondary | ICD-10-CM | POA: Diagnosis present

## 2023-06-28 DIAGNOSIS — R451 Restlessness and agitation: Secondary | ICD-10-CM | POA: Diagnosis present

## 2023-06-28 DIAGNOSIS — R0600 Dyspnea, unspecified: Secondary | ICD-10-CM | POA: Diagnosis not present

## 2023-06-28 DIAGNOSIS — Z515 Encounter for palliative care: Secondary | ICD-10-CM

## 2023-06-28 DIAGNOSIS — Z89431 Acquired absence of right foot: Secondary | ICD-10-CM

## 2023-06-28 DIAGNOSIS — I2510 Atherosclerotic heart disease of native coronary artery without angina pectoris: Secondary | ICD-10-CM | POA: Diagnosis present

## 2023-06-28 DIAGNOSIS — E860 Dehydration: Secondary | ICD-10-CM | POA: Diagnosis present

## 2023-06-28 DIAGNOSIS — I132 Hypertensive heart and chronic kidney disease with heart failure and with stage 5 chronic kidney disease, or end stage renal disease: Secondary | ICD-10-CM | POA: Diagnosis present

## 2023-06-28 DIAGNOSIS — N1832 Chronic kidney disease, stage 3b: Secondary | ICD-10-CM | POA: Diagnosis not present

## 2023-06-28 DIAGNOSIS — Z9049 Acquired absence of other specified parts of digestive tract: Secondary | ICD-10-CM

## 2023-06-28 DIAGNOSIS — Z888 Allergy status to other drugs, medicaments and biological substances status: Secondary | ICD-10-CM

## 2023-06-28 DIAGNOSIS — E785 Hyperlipidemia, unspecified: Secondary | ICD-10-CM | POA: Diagnosis not present

## 2023-06-28 DIAGNOSIS — Z952 Presence of prosthetic heart valve: Secondary | ICD-10-CM

## 2023-06-28 DIAGNOSIS — I700 Atherosclerosis of aorta: Secondary | ICD-10-CM | POA: Diagnosis not present

## 2023-06-28 DIAGNOSIS — Z8673 Personal history of transient ischemic attack (TIA), and cerebral infarction without residual deficits: Secondary | ICD-10-CM

## 2023-06-28 DIAGNOSIS — R570 Cardiogenic shock: Secondary | ICD-10-CM | POA: Diagnosis not present

## 2023-06-28 DIAGNOSIS — I4819 Other persistent atrial fibrillation: Secondary | ICD-10-CM | POA: Diagnosis present

## 2023-06-28 DIAGNOSIS — Z7901 Long term (current) use of anticoagulants: Secondary | ICD-10-CM

## 2023-06-28 DIAGNOSIS — D649 Anemia, unspecified: Secondary | ICD-10-CM | POA: Diagnosis not present

## 2023-06-28 DIAGNOSIS — B9620 Unspecified Escherichia coli [E. coli] as the cause of diseases classified elsewhere: Secondary | ICD-10-CM | POA: Diagnosis present

## 2023-06-28 LAB — CBC
HCT: 33.4 % — ABNORMAL LOW (ref 39.0–52.0)
Hemoglobin: 10.7 g/dL — ABNORMAL LOW (ref 13.0–17.0)
MCH: 30.2 pg (ref 26.0–34.0)
MCHC: 32 g/dL (ref 30.0–36.0)
MCV: 94.4 fL (ref 80.0–100.0)
Platelets: UNDETERMINED 10*3/uL (ref 150–400)
RBC: 3.54 MIL/uL — ABNORMAL LOW (ref 4.22–5.81)
RDW: 18.5 % — ABNORMAL HIGH (ref 11.5–15.5)
WBC: 8.3 10*3/uL (ref 4.0–10.5)
nRBC: 8.7 % — ABNORMAL HIGH (ref 0.0–0.2)

## 2023-06-28 LAB — BASIC METABOLIC PANEL WITH GFR
Anion gap: 16 — ABNORMAL HIGH (ref 5–15)
BUN: 109 mg/dL — ABNORMAL HIGH (ref 8–23)
CO2: 13 mmol/L — ABNORMAL LOW (ref 22–32)
Calcium: 9.1 mg/dL (ref 8.9–10.3)
Chloride: 109 mmol/L (ref 98–111)
Creatinine, Ser: 3.8 mg/dL — ABNORMAL HIGH (ref 0.61–1.24)
GFR, Estimated: 15 mL/min — ABNORMAL LOW (ref >60)
Glucose, Bld: 127 mg/dL — ABNORMAL HIGH (ref 70–99)
Potassium: 5.2 mmol/L — ABNORMAL HIGH (ref 3.5–5.1)
Sodium: 138 mmol/L (ref 135–145)

## 2023-06-28 LAB — BLOOD GAS, VENOUS
Acid-base deficit: 9 mmol/L — ABNORMAL HIGH (ref 0.0–2.0)
Bicarbonate: 16.1 mmol/L — ABNORMAL LOW (ref 20.0–28.0)
O2 Saturation: 21.2 %
Patient temperature: 36.6
pCO2, Ven: 31 mmHg — ABNORMAL LOW (ref 44–60)
pH, Ven: 7.32 (ref 7.25–7.43)
pO2, Ven: <31 mmHg — CL (ref 32–45)

## 2023-06-28 LAB — URINALYSIS, ROUTINE W REFLEX MICROSCOPIC
Bilirubin Urine: NEGATIVE
Glucose, UA: >=500 mg/dL — AB
Ketones, ur: NEGATIVE mg/dL
Nitrite: NEGATIVE
Protein, ur: 100 mg/dL — AB
Specific Gravity, Urine: 1.011 (ref 1.005–1.030)
WBC, UA: >50 WBC/hpf (ref 0–5)
pH: 5 (ref 5.0–8.0)

## 2023-06-28 LAB — PHOSPHORUS: Phosphorus: 7.5 mg/dL — ABNORMAL HIGH (ref 2.5–4.6)

## 2023-06-28 LAB — BRAIN NATRIURETIC PEPTIDE: B Natriuretic Peptide: 2409.7 pg/mL — ABNORMAL HIGH (ref 0.0–100.0)

## 2023-06-28 LAB — TROPONIN I (HIGH SENSITIVITY)
Troponin I (High Sensitivity): 205 ng/L (ref <18)
Troponin I (High Sensitivity): 215 ng/L (ref <18)
Troponin I (High Sensitivity): 226 ng/L (ref <18)
Troponin I (High Sensitivity): 230 ng/L (ref <18)

## 2023-06-28 LAB — CREATININE, URINE, RANDOM: Creatinine, Urine: 67 mg/dL

## 2023-06-28 LAB — LACTIC ACID, PLASMA: Lactic Acid, Venous: 2.9 mmol/L (ref 0.5–1.9)

## 2023-06-28 LAB — SODIUM, URINE, RANDOM: Sodium, Ur: 15 mmol/L

## 2023-06-28 LAB — TSH: TSH: 1.024 u[IU]/mL (ref 0.350–4.500)

## 2023-06-28 LAB — GLUCOSE, CAPILLARY: Glucose-Capillary: 141 mg/dL — ABNORMAL HIGH (ref 70–99)

## 2023-06-28 LAB — OSMOLALITY, URINE: Osmolality, Ur: 429 mosm/kg (ref 300–900)

## 2023-06-28 LAB — MAGNESIUM: Magnesium: 3.5 mg/dL — ABNORMAL HIGH (ref 1.7–2.4)

## 2023-06-28 MED ORDER — APIXABAN 2.5 MG PO TABS
2.5000 mg | ORAL_TABLET | Freq: Two times a day (BID) | ORAL | Status: DC
  Administered 2023-06-28 – 2023-06-29 (×2): 2.5 mg via ORAL
  Filled 2023-06-28 (×4): qty 1

## 2023-06-28 MED ORDER — METOPROLOL TARTRATE 12.5 MG HALF TABLET
12.5000 mg | ORAL_TABLET | Freq: Two times a day (BID) | ORAL | Status: DC
  Administered 2023-06-28 – 2023-06-29 (×2): 12.5 mg via ORAL
  Filled 2023-06-28 (×2): qty 1

## 2023-06-28 MED ORDER — METOPROLOL TARTRATE 5 MG/5ML IV SOLN
5.0000 mg | Freq: Once | INTRAVENOUS | Status: AC
  Administered 2023-06-28: 5 mg via INTRAVENOUS
  Filled 2023-06-28: qty 5

## 2023-06-28 MED ORDER — ROSUVASTATIN CALCIUM 20 MG PO TABS
40.0000 mg | ORAL_TABLET | Freq: Every evening | ORAL | Status: DC
  Administered 2023-06-28 – 2023-06-29 (×2): 40 mg via ORAL
  Filled 2023-06-28 (×2): qty 2

## 2023-06-28 MED ORDER — ONDANSETRON HCL 4 MG PO TABS
4.0000 mg | ORAL_TABLET | Freq: Four times a day (QID) | ORAL | Status: DC | PRN
Start: 2023-06-28 — End: 2023-06-28

## 2023-06-28 MED ORDER — SODIUM CHLORIDE 0.9 % IV BOLUS
500.0000 mL | Freq: Once | INTRAVENOUS | Status: AC
  Administered 2023-06-28: 500 mL via INTRAVENOUS

## 2023-06-28 MED ORDER — EZETIMIBE 10 MG PO TABS
10.0000 mg | ORAL_TABLET | Freq: Every day | ORAL | Status: DC
  Administered 2023-06-28 – 2023-06-29 (×2): 10 mg via ORAL
  Filled 2023-06-28 (×3): qty 1

## 2023-06-28 MED ORDER — FUROSEMIDE 10 MG/ML IJ SOLN: 40.0000 mg | Freq: Once | INTRAMUSCULAR | Status: DC

## 2023-06-28 MED ORDER — ACETAMINOPHEN 650 MG RE SUPP: 650.0000 mg | Freq: Four times a day (QID) | RECTAL | Status: DC | PRN

## 2023-06-28 MED ORDER — SODIUM BICARBONATE 8.4 % IV SOLN
INTRAVENOUS | Status: DC
  Filled 2023-06-28 (×2): qty 1000

## 2023-06-28 MED ORDER — ONDANSETRON HCL 4 MG/2ML IJ SOLN: 4.0000 mg | Freq: Four times a day (QID) | INTRAMUSCULAR | Status: DC | PRN

## 2023-06-28 MED ORDER — ACETAMINOPHEN 325 MG PO TABS
650.0000 mg | ORAL_TABLET | Freq: Four times a day (QID) | ORAL | Status: DC | PRN
  Administered 2023-06-29 (×2): 650 mg via ORAL
  Filled 2023-06-28 (×2): qty 2

## 2023-06-28 MED ORDER — INSULIN ASPART 100 UNIT/ML IJ SOLN: 0.0000 [IU] | Freq: Three times a day (TID) | INTRAMUSCULAR | Status: DC

## 2023-06-28 NOTE — H&P (Signed)
 History and Physical    Patient: Robert Horn ZOX:[id number] DOB: 1934-03-15 DOA: 06/28/2023 DOS: the patient was seen and examined on 06/28/2023 PCP: Renford Dills, MD  Patient coming from: Home  Chief Complaint: [facility], generalized weakness  HPI: Robert Horn is a 89 y.o. male with medical history significant of chronic combined systolic and diastolic heart failure, atrial fibrillation on Eliquis, severe AS s/p TAVR (2018), PAD s/p right transmetatarsal amputation, hx of colon cancer s/p colectomy, CKD stage IIIb, type 2 diabetes with neuropathy, hypertension, hyperlipidemia, anemia of chronic disease, recent history of CVA presenting to the ED with [facility] and generalized weakness.  Patient recently admitted for stroke thought to be cardioembolic given findings of new onset atrial fibrillation during prior admission.  He was ultimately discharged about a week ago.  Patient and wife report that since patient has been home, he has been generally feeling unwell.  Wife notes that patient has been feeling a lot weaker than his baseline.  Patient has also been noting intermittent palpitations at home.  Per wife, patient has been breathing very hard over the past few days even at rest.  Patient denies any fevers, chills, vomiting, chest pain, abdominal pain, urinary changes. Per wife, patient has not been eating or drinking much over the past week.  Patient's wife reports that patient seemed to be doing much better on his previous GDMT for heart failure.  He was previously on carvedilol, spironolactone, Jardiance, Entresto, and Lasix.  At last hospital discharge, patient's GDMT was changed to Toprol-XL and Jardiance. Wife does report that she spoke with cardiology via phone call and was told to increase patient's toprol-xl from 25mg  to 50mg  daily which she has done for the past 2 days.   ED course: Vital signs notable for tachycardia and tachypnea.  CBC with hemoglobin 10.7, around baseline.  BMP with mild  hyperkalemia, bicarb 13, creatinine 3.8 (baseline around 1.6), anion gap 16.  High-sensitivity troponins elevated at 205 and 215 on repeat.  BNP 2400. CXR with left lung base atelectasis and mild cardiomegaly. EKG showing atrial fibrillation with RBBB and LFPB. Patient in AF with RVR, given IV lopressor 5mg  x2 doses while in the ED. [facility] hospitalist asked to evaluate patient for admission.  Review of Systems: As mentioned in the history of present illness. All other systems reviewed and are negative. Past Medical History:  Diagnosis Date   Anemia    Anxiety    situational- surgery   Asthma    as a child   Cancer (HCC) 2019   colon- colectomy    CHF (congestive heart failure) (HCC)    Chronic kidney disease    followed by Dr. Nehemiah Settle   Coronary artery disease    Diabetes mellitus without complication (HCC)    Type II   Dyslipidemia 10/27/2015   Dyspnea    w/ exertion    Elevated PSA 10/27/2015   Erectile dysfunction 10/27/2015   Heart murmur    Hypertension 10/27/2015   Hypogonadism male 10/27/2015   Neuropathy    Obesity 10/27/2015   Peripheral vascular disease (HCC)    Pneumonia 10/27/2015   pt states was 1982   Rotator cuff tear 10/27/2015   Past Surgical History:  Procedure Laterality Date   ABDOMINAL AORTOGRAM N/A 02/01/2019   Procedure: ABDOMINAL AORTOGRAM;  Surgeon: Cephus Shelling, MD;  Location[facility] INVASIVE CV LAB;  Service: Cardiovascular;  Laterality: N/A;   AMPUTATION Right 02/03/2019   Procedure: RIGHT FOOT 1ST RAY AMPUTATION;  Surgeon: Nadara Mustard,  MD;  Location: [facility] OR;  Service: Orthopedics;  Laterality: Right;   AMPUTATION Right 08/02/2019   Procedure: RIGHT 2ND TOE AMPUTATION;  Surgeon: Nadara Mustard, MD;  Location: [facility] OR;  Service: Orthopedics;  Laterality: Right;   AMPUTATION Right 11/08/2020   Procedure: RIGHT TRANSMETATARSAL AMPUTATION;  Surgeon: Nadara Mustard, MD;  Location: [facility] OR;  Service: Orthopedics;  Laterality: Right;   APPLICATION OF WOUND VAC   11/08/2020   Procedure: APPLICATION OF WOUND VAC;  Surgeon: Nadara Mustard, MD;  Location: [facility] OR;  Service: Orthopedics;;   CARDIAC CATHETERIZATION N/A 11/14/2015   Procedure: Left Heart Cath and Coronary Angiography;  Surgeon: Lyn Records, MD;  Location: [facility] INVASIVE CV LAB;  Service: Cardiovascular;  Laterality: N/A;   COLONOSCOPY     LAPAROSCOPIC PARTIAL COLECTOMY  12/15/2017   LAPAROSCOPIC PARTIAL COLECTOMY (N/A Abdomen)   LAPAROSCOPIC PARTIAL COLECTOMY N/A 12/15/2017   Procedure: LAPAROSCOPIC PARTIAL COLECTOMY;  Surgeon: Harriette Bouillon, MD;  Location: [facility] OR;  Service: General;  Laterality: N/A;   LOWER EXTREMITY ANGIOGRAPHY Right 02/01/2019   Procedure: LOWER EXTREMITY ANGIOGRAPHY;  Surgeon: Cephus Shelling, MD;  Location: [facility] INVASIVE CV LAB;  Service: Cardiovascular;  Laterality: Right;   PERIPHERAL VASCULAR INTERVENTION Right 02/01/2019   Procedure: PERIPHERAL VASCULAR INTERVENTION;  Surgeon: Cephus Shelling, MD;  Location: [facility] INVASIVE CV LAB;  Service: Cardiovascular;  Laterality: Right;  SFA   RIGHT HEART CATH AND CORONARY ANGIOGRAPHY N/A 03/11/2023   Procedure: RIGHT HEART CATH AND CORONARY ANGIOGRAPHY;  Surgeon: Kathleene Hazel, MD;  Location: [facility] INVASIVE CV LAB;  Service: Cardiovascular;  Laterality: N/A;   TEE WITHOUT CARDIOVERSION N/A 12/15/2016   Procedure: TRANSESOPHAGEAL ECHOCARDIOGRAM (TEE);  Surgeon: Tonny Bollman, MD;  Location: [facility] OR;  Service: Open Heart Surgery;  Laterality: N/A;   TRANSCATHETER AORTIC VALVE REPLACEMENT, TRANSFEMORAL N/A 12/15/2016   Procedure: TRANSCATHETER AORTIC VALVE REPLACEMENT, TRANSFEMORAL;  Surgeon: Tonny Bollman, MD;  Location: [facility] OR;  Service: Open Heart Surgery;  Laterality: N/A;   Social History:  reports that he has quit smoking. His smoking use included cigarettes. He has never been exposed to tobacco smoke. He has never used smokeless tobacco. He reports current alcohol use. He reports that he does not use  drugs.  Allergies  Allergen Reactions   Ticagrelor Other (See Comments)    Unknown     Family History  Problem Relation Age of Onset   Diabetes Mother    Heart disease Mother    Pulmonary embolism Father     Prior to Admission medications   Medication Sig Start Date End Date Taking? Authorizing Provider  apixaban (ELIQUIS) 2.5 MG TABS tablet Take 1 tablet (2.5 mg total) by mouth 2 (two) times daily. 06/19/23   Mathews Argyle, NP  benzonatate (TESSALON) 100 MG capsule Take 100 mg by mouth 3 (three) times daily as needed for cough. 06/11/23   [provider]  Cholecalciferol (VITAMIN D) 50 MCG (2000 UT) tablet Take 2,000 Units by mouth daily.    [provider]  diclofenac Sodium (VOLTAREN) 1 % GEL Apply 1 Application topically as needed (pain). 03/03/22   [provider]  ezetimibe (ZETIA) 10 MG tablet Take 1 tablet (10 mg total) by mouth daily. 05/31/23   Chilton Si, MD  JARDIANCE 25 MG TABS tablet Take 25 mg by mouth daily. 12/06/19   [provider]  metoprolol succinate (TOPROL-XL) 25 MG 24 hr tablet Take 1 tablet (25 mg total) by mouth daily. 06/20/23   Gevena Mart  A, NP  midodrine (PROAMATINE) 2.5 MG tablet Take 1 tablet (2.5 mg total) by mouth 2 (two) times daily with a meal. 06/19/23   Mathews Argyle, NP  rosuvastatin (CRESTOR) 40 MG tablet Take 1 tablet (40 mg total) by mouth daily. Patient taking differently: Take 40 mg by mouth every evening. 05/15/21   Ihor Austin, NP  VITAMIN B COMPLEX-C PO Take 1 tablet by mouth daily.    [provider]    Physical Exam: Vitals:   06/28/23 1315 06/28/23 1330 06/28/23 1345 06/28/23 1400  BP: (!) 127/111 (!) 120/92 (!) 133/117 (!) 115/95  Pulse: (!) 116 83 (!) 114 (!) 118  Resp: (!) 26 (!) 27 (!) 26 (!) 23  Temp:      TempSrc:      SpO2: 95% 100% 100% 98%  Weight:      Height:       Physical Exam Constitutional:      Comments: Chronically ill appearing elderly male, laying in  bed, NAD.  HENT:     Head: Normocephalic and atraumatic.     Mouth/Throat:     Mouth: Mucous membranes are moist.     Pharynx: Oropharynx is clear. No oropharyngeal exudate.  Eyes:     General: No scleral icterus.    Extraocular Movements: Extraocular movements intact.     Pupils: Pupils are equal, round, and reactive to light.  Cardiovascular:     Rate and Rhythm: Tachycardia present. Rhythm irregular.     Heart sounds: Normal heart sounds. No murmur heard.    No friction rub. No gallop.     Comments: No obvious JVD. Pulmonary:     Effort: No respiratory distress.     Comments: Tachypneic with heavy breathing at rest but no accessory muscle usage noted. Saturating >92% on RA. No crackles, wheezing, or rhonchi noted. Abdominal:     General: Bowel sounds are normal. There is no distension.     Palpations: Abdomen is soft.     Tenderness: There is no abdominal tenderness. There is no guarding or rebound.  Musculoskeletal:        General: Normal range of motion.     Cervical back: Normal range of motion.     Comments: Trace pitting edema in bilateral lower extremities. Right transmetatarsal amputation noted, chronic.  Skin:    General: Skin is warm and dry.  Neurological:     General: No focal deficit present.     Mental Status: He is alert. Mental status is at baseline.  Psychiatric:        Mood and Affect: Mood normal.        Behavior: Behavior normal.     Data Reviewed:  There are no new results to review at this time.     Latest Ref Rng & Units 06/28/2023   10:34 AM 06/21/2023    7:54 AM 06/20/2023    6:50 AM  CBC  WBC 4.0 - 10.5 K/uL 8.3  6.1  6.3   Hemoglobin 13.0 - 17.0 g/dL 16.1  9.7  9.6   Hematocrit 39.0 - 52.0 % 33.4  30.4  30.0   Platelets 150 - 400 K/uL PLATELET CLUMPS NOTED ON SMEAR, UNABLE TO ESTIMATE  PLATELET CLUMPS NOTED ON SMEAR, UNABLE TO ESTIMATE  PLATELET CLUMPS NOTED ON SMEAR, UNABLE TO ESTIMATE       Latest Ref Rng & Units 06/28/2023   10:34 AM  06/21/2023    7:54 AM 06/20/2023    6:50 AM  CMP  Glucose 70 - 99 mg/dL 578  96  96   BUN 8 - 23 mg/dL 469  28  26   Creatinine 0.61 - 1.24 mg/dL 6.29  5.28  4.13   Sodium 135 - 145 mmol/L 138  140  139   Potassium 3.5 - 5.1 mmol/L 5.2  4.3  4.7   Chloride 98 - 111 mmol/L 109  111  112   CO2 22 - 32 mmol/L 13  18  19    Calcium 8.9 - 10.3 mg/dL 9.1  9.1  8.7    BNP    Component Value Date/Time   BNP 2,409.7 (H) 06/28/2023 1228   Hs-troponins: 205 > 215  US RENAL Result Date: 06/28/2023 CLINICAL DATA:  Acute kidney injury EXAM: RENAL / URINARY TRACT ULTRASOUND COMPLETE COMPARISON:  CT 12/10/2017 FINDINGS: Right Kidney: Renal measurements: 9.5 x 6.1 x 5.3 cm = volume: 161.5 mL. No collecting system dilatation perinephric fluid. Multiple simple cysts are identified as seen on previous examination. These includes central focus measuring 2.7 x 2.9 x 2.7 cm and lower pole focus measuring 3.9 x 3.0 x 3.9 cm. Left Kidney: Renal measurements: 10.0 x 6.3 x 5.3 cm = volume: 176.6 mL. No collecting system dilatation perinephric fluid. Simple appearing cysts are seen including measuring 2.6 x 2.8 x 2.7 cm Bladder: Underdistended. Other: Prostate poorly defined but felt to be enlarged. Tiny left pleural effusion IMPRESSION: No collecting system dilatation. Bilateral renal cysts are identified. These were seen on previous CT. Enlarged prostate but poorly defined on this examination. Tiny left pleural effusion Electronically Signed   By: Karen Kays M.D.   On: 06/28/2023 15:53   DG Chest 2 View Result Date: 06/28/2023 CLINICAL DATA:  Chest pain. EXAM: CHEST - 2 VIEW COMPARISON:  Chest radiograph dated 03/10/2023. FINDINGS: Shallow inspiration with left lung base atelectasis. No pleural effusion or pneumothorax. Mild cardiomegaly. Aortic valve repair. Atherosclerotic calcification of the aorta. No acute osseous pathology. IMPRESSION: 1. Shallow inspiration with left lung base atelectasis. 2. Mild cardiomegaly.  Electronically Signed   By: Elgie Collard M.D.   On: 06/28/2023 11:54    Assessment and Plan: No notes have been filed under this hospital service. Service: Hospitalist   Atrial fibrillation with RVR Patient diagnosed with atrial fibrillation during recent hospitalization earlier this month.  His Toprol-XL dose was recently increased from 25 mg to 50 mg daily though patient has continued to note palpitations.  Patient did enter RVR while in the ED, given 2 doses of IV Lopressor 5 mg.  His rates are continuing to sustain in the 110s to 120s.  Cardiology has been consulted. -Cardiology consulted, appreciate assistance -Resume home Eliquis 2.5 mg twice daily -Started on metoprolol tartrate 12.5 mg twice daily per cardiology -Follow-up TSH -Telemetry -Admit to inpatient  Chronic biventricular HF Nonischemic cardiomyopathy Echocardiogram earlier this month showing EF 25 to 30%, global hypokinesis, reduced RV function, biatrial enlargement, small pericardial effusion.  He had a RHC/LHC in 02/2023 showing no angiographic evidence of CAD.  Volume status is difficult to assess.  He does have an elevated BNP at 2400 but weight appears to be 5 kg down than his baseline per chart review.  On exam, he does appear somewhat dry and has only trace peripheral edema.  Discussed with cardiology and nephrology, will trial IV fluids.  GDMT limited due to renal function. -500 cc IV fluid bolus followed by maintenance IV fluids at 75 cc an hour for 12 hours -Start oral metoprolol to tartrate  12.5 mg twice daily per cardiology -Strict I/O's, daily weights -Add back GDMT as tolerated by renal function  AKI on CKD 3B AGMA Baseline kidney function with Cr 1.6-1.8. On admission, Cr 3.8 and BUN 109. He does appear somewhat dry on exam. Will provide IVF challenge. No current indications for RRT. Patient does have an AGMA likely due to severe AKI. -nephrology following, appreciate assistance -f/u renal ultrasound,  urine lytes -0.5L IVF bolus followed by maintenance IVF at 75cc/hr -trend kidney function, strict I/O's -avoid nephrotoxic medications as able  Elevated troponin -hsts 205 > 215 -Suspect secondary to demand ischemia given tachyarrhythmia -EKG without any acute ischemic changes -Patient is currently chest pain-free  Hypertension History of hypotension Per chart review, patient was started on midodrine 2.5 mg twice daily for hypotension in the past though has also been on GDMT for heart failure.  He is currently on metoprolol succinate 50 mg daily after recent dose increase.  Blood pressures ranging in the 110s-130s/90s-110s.  Given A-fib with RVR, cardiology is following and have started metoprolol tartrate 12.5 mg twice daily.  Will hold home midodrine for now and will need to decide whether to continue at discharge. -start metoprolol tartrate 12.5mg  BID -holding home midodrine  Mild hyperkalemia -trend potassium with fluid rehydration  Hx of CVA PAD s/p right transmetatarsal amputation (01/2023) HLD LDL 66 on lipid panel earlier this month. -resumed home eliquis -resumed home crestor 40mg  daily and zetia 10mg  daily -PT/OT eval  Anemia of chronic disease Baseline hgb around 10. On admission, hgb 10.7. No reported bleeding events. -trend hgb curve, transfuse if hgb <7  Type 2 diabetes with neuropathy Hgb A1c 6.7% on 06/17/2023. Patient on jardiance 25mg  daily at home. Blood glucose 127 on BMP today. -trend CBGs, goal 140-180 -holding home jardiance -consider adding SSI if CBGs above goal   Advance Care Planning:   Code Status: Full Code   Consults: cardiology, nephrology  Family Communication: updated family at bedside  Severity of Illness: The appropriate patient status for this patient is OBSERVATION. Observation status is judged to be reasonable and necessary in order to provide the required intensity of service to ensure the patient's safety. The patient's presenting  symptoms, physical exam findings, and initial radiographic and laboratory data in the context of their medical condition is felt to place them at decreased risk for further clinical deterioration. Furthermore, it is anticipated that the patient will be medically stable for discharge from the hospital within 2 midnights of admission.   Portions of this note were generated with Scientist, clinical (histocompatibility and immunogenetics). Dictation errors may occur despite best attempts at proofreading.  Author: Briscoe Burns, MD 06/28/2023 2:25 PM  For on call review [email or web address].

## 2023-06-28 NOTE — Consult Note (Addendum)
 [facility]  INPATIENT CONSULTATION  Reason for Consultation: AKI on CKD Requesting Provider: Dr. Austin Miles  HPI: Robert Horn is an 89 y.o. male with HFrEF, HTN, HL, DM, CAD, PAD s/p R partial foot amputation, h/o severe AS s/p TAVR 2018, A fib on eliquis currently being admitted with weakness and dyspnea and nephrology is consulted for evaluation and management of AKI on CKD.   Pt unable to provide much history due to severe hearing loss.  Wife Robert Horn not in room in ED when I was present - called 2 numbers listed in Epic for her and did not reach her.  Thus information gained from chart.   Pt admitted [facility] 3/19-24 with new Afib and BL MCA infarcts thought embolic treated with thrombolytics.  He was started on eliquis 2.5 daily and toprol 25.  CHF meds on admission included entresto, jardiance, spironolactone and lasix; only jardiance cont on d/c.  Midodrine added for hypotension - day of D/C BPs were in the low 100s.   Presented to the ED today with weakness, difficulty breathing, N/V, decreased po intake.  Found to have BP 127/111, HR 116, RR 26, O2 sat 95% RA.  Labs showing Na 138, K 5.2, Bicarb 13, BUN 109, Cr 3.8 (from 28/1.59 on 3/24), HB 10.7 (from 9.7), WBC 8.3, clumped platelets. CXR shallow insp, L atelectasis, mild cardiomegaly.   Pt denies difficulty voiding.  A renal US is being completed.   Cardiology and nephrology have been consulted.    PMH: Past Medical History:  Diagnosis Date   Anemia    Anxiety    situational- surgery   Asthma    as a child   Cancer (HCC) 2019   colon- colectomy    CHF (congestive heart failure) (HCC)    Chronic kidney disease    followed by Dr. Nehemiah Settle   Coronary artery disease    Diabetes mellitus without complication (HCC)    Type II   Dyslipidemia 10/27/2015   Dyspnea    w/ exertion    Elevated PSA 10/27/2015   Erectile dysfunction 10/27/2015   Heart murmur    Hypertension 10/27/2015   Hypogonadism male 10/27/2015    Neuropathy    Obesity 10/27/2015   Peripheral vascular disease (HCC)    Pneumonia 10/27/2015   pt states was 1982   Rotator cuff tear 10/27/2015   PSH: Past Surgical History:  Procedure Laterality Date   ABDOMINAL AORTOGRAM N/A 02/01/2019   Procedure: ABDOMINAL AORTOGRAM;  Surgeon: Cephus Shelling, MD;  Location: [facility] INVASIVE CV LAB;  Service: Cardiovascular;  Laterality: N/A;   AMPUTATION Right 02/03/2019   Procedure: RIGHT FOOT 1ST RAY AMPUTATION;  Surgeon: Nadara Mustard, MD;  Location: [facility] OR;  Service: Orthopedics;  Laterality: Right;   AMPUTATION Right 08/02/2019   Procedure: RIGHT 2ND TOE AMPUTATION;  Surgeon: Nadara Mustard, MD;  Location: [facility] OR;  Service: Orthopedics;  Laterality: Right;   AMPUTATION Right 11/08/2020   Procedure: RIGHT TRANSMETATARSAL AMPUTATION;  Surgeon: Nadara Mustard, MD;  Location: [facility] OR;  Service: Orthopedics;  Laterality: Right;   APPLICATION OF WOUND VAC  11/08/2020   Procedure: APPLICATION OF WOUND VAC;  Surgeon: Nadara Mustard, MD;  Location: [facility] OR;  Service: Orthopedics;;   CARDIAC CATHETERIZATION N/A 11/14/2015   Procedure: Left Heart Cath and Coronary Angiography;  Surgeon: Lyn Records, MD;  Location: [facility] INVASIVE CV LAB;  Service: Cardiovascular;  Laterality: N/A;   COLONOSCOPY     LAPAROSCOPIC PARTIAL COLECTOMY  12/15/2017  LAPAROSCOPIC PARTIAL COLECTOMY (N/A Abdomen)   LAPAROSCOPIC PARTIAL COLECTOMY N/A 12/15/2017   Procedure: LAPAROSCOPIC PARTIAL COLECTOMY;  Surgeon: Harriette Bouillon, MD;  Location: [facility] OR;  Service: General;  Laterality: N/A;   LOWER EXTREMITY ANGIOGRAPHY Right 02/01/2019   Procedure: LOWER EXTREMITY ANGIOGRAPHY;  Surgeon: Cephus Shelling, MD;  Location: [facility] INVASIVE CV LAB;  Service: Cardiovascular;  Laterality: Right;   PERIPHERAL VASCULAR INTERVENTION Right 02/01/2019   Procedure: PERIPHERAL VASCULAR INTERVENTION;  Surgeon: Cephus Shelling, MD;  Location: [facility] INVASIVE CV LAB;  Service: Cardiovascular;  Laterality: Right;   SFA   RIGHT HEART CATH AND CORONARY ANGIOGRAPHY N/A 03/11/2023   Procedure: RIGHT HEART CATH AND CORONARY ANGIOGRAPHY;  Surgeon: Kathleene Hazel, MD;  Location: [facility] INVASIVE CV LAB;  Service: Cardiovascular;  Laterality: N/A;   TEE WITHOUT CARDIOVERSION N/A 12/15/2016   Procedure: TRANSESOPHAGEAL ECHOCARDIOGRAM (TEE);  Surgeon: Tonny Bollman, MD;  Location: [facility] OR;  Service: Open Heart Surgery;  Laterality: N/A;   TRANSCATHETER AORTIC VALVE REPLACEMENT, TRANSFEMORAL N/A 12/15/2016   Procedure: TRANSCATHETER AORTIC VALVE REPLACEMENT, TRANSFEMORAL;  Surgeon: Tonny Bollman, MD;  Location: [facility] OR;  Service: Open Heart Surgery;  Laterality: N/A;    Past Medical History:  Diagnosis Date   Anemia    Anxiety    situational- surgery   Asthma    as a child   Cancer (HCC) 2019   colon- colectomy    CHF (congestive heart failure) (HCC)    Chronic kidney disease    followed by Dr. Nehemiah Settle   Coronary artery disease    Diabetes mellitus without complication (HCC)    Type II   Dyslipidemia 10/27/2015   Dyspnea    w/ exertion    Elevated PSA 10/27/2015   Erectile dysfunction 10/27/2015   Heart murmur    Hypertension 10/27/2015   Hypogonadism male 10/27/2015   Neuropathy    Obesity 10/27/2015   Peripheral vascular disease (HCC)    Pneumonia 10/27/2015   pt states was 1982   Rotator cuff tear 10/27/2015    Medications:  I have reviewed the patient's current medications.  (Not in a hospital admission)   ALLERGIES:   Allergies  Allergen Reactions   Ticagrelor Other (See Comments)    Unknown     FAM HX: Family History  Problem Relation Age of Onset   Diabetes Mother    Heart disease Mother    Pulmonary embolism Father     Social History:   reports that he has quit smoking. His smoking use included cigarettes. He has never been exposed to tobacco smoke. He has never used smokeless tobacco. He reports current alcohol use. He reports that he does not use drugs.  ROS: 12  system ROS neg except per HPI  Blood pressure (!) 115/95, pulse (!) 118, temperature 97.7 F (36.5 C), temperature source Oral, resp. rate (!) 23, height 6' (1.829 m), weight 94.8 kg, SpO2 98%. PHYSICAL EXAM: Gen: elderly man lying flat in bed calmly  Eyes: glasses, EOMI ENT: dentures, MM tacky Neck: supple, unable to assess JVD CV: tachycardic, appearing A fib on monitor, no rub Abd: soft, nontender Lungs: clear to bases while lying GU: no foley Extr: R TMT, L trace ankle edema Neuro: extremely hard of hearing, challenging getting a history; does not appear to have focal deficits Skin: no rashes noted   Results for orders placed or performed during the hospital encounter of 06/28/23 (from the past 48 hours)  Basic metabolic panel     Status: Abnormal  Collection Time: 06/28/23 10:34 AM  Result Value Ref Range   Sodium 138 135 - 145 mmol/L   Potassium 5.2 (H) 3.5 - 5.1 mmol/L   Chloride 109 98 - 111 mmol/L   CO2 13 (L) 22 - 32 mmol/L   Glucose, Bld 127 (H) 70 - 99 mg/dL    Comment: Glucose reference range applies only to samples taken after fasting for at least 8 hours.   BUN 109 (H) 8 - 23 mg/dL   Creatinine, Ser 6.44 (H) 0.61 - 1.24 mg/dL   Calcium 9.1 8.9 - 03.4 mg/dL   GFR, Estimated 15 (L) >60 mL/min    Comment: (NOTE) Calculated using the CKD-EPI Creatinine Equation (2021)    Anion gap 16 (H) 5 - 15    Comment: Performed at [facility], [address] [place], [place] [address]  CBC     Status: Abnormal   Collection Time: 06/28/23 10:34 AM  Result Value Ref Range   WBC 8.3 4.0 - 10.5 K/uL   RBC 3.54 (L) 4.22 - 5.81 MIL/uL   Hemoglobin 10.7 (L) 13.0 - 17.0 g/dL   HCT 56.3 (L) 87.5 - 64.3 %   MCV 94.4 80.0 - 100.0 fL   MCH 30.2 26.0 - 34.0 pg   MCHC 32.0 30.0 - 36.0 g/dL   RDW 32.9 (H) 51.8 - 84.1 %   Platelets PLATELET CLUMPS NOTED ON SMEAR, UNABLE TO ESTIMATE 150 - 400 K/uL    Comment: Immature Platelet Fraction may be clinically indicated,  consider ordering this additional test [id number]    nRBC 8.7 (H) 0.0 - 0.2 %    Comment: Performed at [facility], [address], [place], [place] [address]  Troponin I (High Sensitivity)     Status: Abnormal   Collection Time: 06/28/23 10:34 AM  Result Value Ref Range   Troponin I (High Sensitivity) 205 (HH) <18 ng/L    Comment: CRITICAL RESULT CALLED TO, READ BACK BY AND VERIFIED WITH CHENEY,A RN @ 1247 06/28/23 LEONARD,A (NOTE) Elevated high sensitivity troponin I (hsTnI) values and significant  changes across serial measurements may suggest ACS but many other  chronic and acute conditions are known to elevate hsTnI results.  Refer to the "Links" section for chest pain algorithms and additional  guidance. Performed at [facility], [address] [place], [place] [address]   Troponin I (High Sensitivity)     Status: Abnormal   Collection Time: 06/28/23 12:28 PM  Result Value Ref Range   Troponin I (High Sensitivity) 215 (HH) <18 ng/L    Comment: CRITICAL RESULT CALLED TO, READ BACK BY AND VERIFIED WITH A. CHENEY, RN @ 1357 06/28/23 BY SEKDAHL (NOTE) Elevated high sensitivity troponin I (hsTnI) values and significant  changes across serial measurements may suggest ACS but many other  chronic and acute conditions are known to elevate hsTnI results.  Refer to the "Links" section for chest pain algorithms and additional  guidance. Performed at [facility], [address], [place], [place] [address]   Brain natriuretic peptide     Status: Abnormal   Collection Time: 06/28/23 12:28 PM  Result Value Ref Range   B Natriuretic Peptide 2,409.7 (H) 0.0 - 100.0 pg/mL    Comment: Performed at [facility], [address] [place], [place] [address]  Magnesium     Status: Abnormal   Collection Time: 06/28/23 12:28 PM  Result Value Ref Range   Magnesium 3.5 (H) 1.7 -  2.4 mg/dL    Comment: Performed at [facility], [address], [place], [place]  [address]  Phosphorus     Status: Abnormal   Collection Time: 06/28/23 12:28 PM  Result Value Ref Range   Phosphorus 7.5 (H) 2.5 - 4.6 mg/dL    Comment: Performed at [facility], [address] [place], [place] [address]    DG Chest 2 View Result Date: 06/28/2023 CLINICAL DATA:  Chest pain. EXAM: CHEST - 2 VIEW COMPARISON:  Chest radiograph dated 03/10/2023. FINDINGS: Shallow inspiration with left lung base atelectasis. No pleural effusion or pneumothorax. Mild cardiomegaly. Aortic valve repair. Atherosclerotic calcification of the aorta. No acute osseous pathology. IMPRESSION: 1. Shallow inspiration with left lung base atelectasis. 2. Mild cardiomegaly. Electronically Signed   By: Elgie Collard M.D.   On: 06/28/2023 11:54    Assessment/PlanRonald Mccubbins is an 89 y.o. male with HFrEF, HTN, HL, DM, CAD, PAD s/p R partial foot amputation, h/o severe AS s/p TAVR 2018, A fib on eliquis currently being admitted with weakness and dyspnea and nephrology is consulted for evaluation and management of AKI on CKD.   **AKI on CKD3b: baseline kidney function with Cr in the 1.6-1.8mg /dL dating back at least a few years, now presenting with BUN 109, Cr 3.8.  By history and exam he is appearing on the dry side.  Will give judicious volume challenge - has 0.5L bolus then will follow with 12h 75/hr isotonic bicarb.  Would hold SGLT2i for now (other meds inc MRB, entresto, lasix were on hold at last d/c already).   Await UA, urine lytes, renal US.  Follow strict I/Os, daily weights.  Avoid nephrotoxins and hypotension - cont midodrine as needed for BP support.   No current indications for RRT.  Follow daily labs.    **Hyperkalemia: mild, follow with fluids  **AGMA: in the setting of severe AKI.  12h na bicarb gtt.   **Chronic biV HFrEF:  05/2023 TTE LVEF 25%, RV decreased. Appears compensated currently.  Meds limited by GFR and BPs.   **Dyspnea:  unclear etiology - CXR relatively clear, doesn't appear to be  volume or PNA.  Poss related to metabolic acidosis, will obtain VBG as O2 sats are ok on RA.   **A fib with RVR:  cardiology following.  On eliquis and BB.   **Normocytic anemia:  Hb in the 10s and stable c/w recent admission.  Follow.   **recent CVA: thought to be embolic in setting of new A fib, tx with thrombolytics earlier this month.  Will follow, reach out with concerns.   Tyler Pita 06/28/2023, 2:53 PM

## 2023-06-28 NOTE — Telephone Encounter (Signed)
 Pt daughter called in asking if someone can call pt spouse. She states she has some questions about pt medications. Please advise.

## 2023-06-28 NOTE — ED Triage Notes (Signed)
 Pt came in via POV d/t feeling weak since he was d/c from here after having a stroke on the 19th, was d/c on the 24th. Wife reports he has Hx of CHF as well & since the a stroke some of his meds have been changed & he has been having trouble breathing as well. Pt did have n/v this morning & has not been able to keep any of his medications down as well.

## 2023-06-28 NOTE — ED Notes (Signed)
 Patient transported to X-ray

## 2023-06-28 NOTE — Progress Notes (Addendum)
 Lactic acidosis-secondary to dehydration and AKI on CKD 3B RN reported that VBG showing pH 7.3, pCO2 31, pO2 31 and bicarb only 16..  Normal pO2 level is 32.  Patient is 100% on room air. Patient has been admitted for high anion gap metabolic acidosis and AKI on CKD stage IIIb.   Elevated lactic acid 2.1 in the setting of dehydration.  Currently on maintenance fluid. Throughout the day patient has dyspnea .  Chest x-ray showed left lung base atelectasis and mild cardiomegaly.Marland Kitchen  Dyspnea likely secondary to metabolic acidosis related and left-sided atelectasis related.  -Continue to monitor pulse ox and maintain supplemental oxygen to keep O2 sat above 96%.  Continue flutter valve every 2 hour while awake to prevent further atelectasis. -Continue bicarb drip 75 cc/h.  Patient has history of CHF EF 25%  which is limiting excessive fluid resuscitation. Will follow-up with the lactic acid in the morning.  Tereasa Coop, MD [facility] 06/28/2023, 11:18 PM

## 2023-06-28 NOTE — Telephone Encounter (Signed)
 Called and left VM for pt's spouse to call back.

## 2023-06-28 NOTE — ED Provider Notes (Signed)
 [place] EMERGENCY DEPARTMENT AT [facility] Provider Note   CSN: [id number] Arrival date & time: 06/28/23  1009     History  Chief Complaint  Patient presents with   Difficulty Breathing   Weakness   Emesis    Robert Horn is a 89 y.o. male.  HPI Since the patient's discharge she has had ongoing generalized weakness.  Patient's wife reports that he seems to have decreased appetite.  He does continue to eat a little bit but this morning after breakfast actually got nauseated and could not eat.  She reports he seemed to have more difficulty breathing over the past couple of days.  He has been breathing more heavily.  Patient was diagnosed with new onset atrial fibrillation and started on Eliquis and metoprolol.  Patient's wife reports per instructions she doubled the metoprolol dose to 50 mg daily for the past 2 days but it has not helped symptoms.  Patient has a history of a stroke but does not have lateralizing symptoms.  Patient's wife reports his symptoms are more cognitive with memory and recall.    Home Medications Prior to Admission medications   Medication Sig Start Date End Date Taking? Authorizing Provider  apixaban (ELIQUIS) 2.5 MG TABS tablet Take 1 tablet (2.5 mg total) by mouth 2 (two) times daily. 06/19/23   Mathews Argyle, NP  benzonatate (TESSALON) 100 MG capsule Take 100 mg by mouth 3 (three) times daily as needed for cough. 06/11/23   [provider]  Cholecalciferol (VITAMIN D) 50 MCG (2000 UT) tablet Take 2,000 Units by mouth daily.    [provider]  diclofenac Sodium (VOLTAREN) 1 % GEL Apply 1 Application topically as needed (pain). 03/03/22   [provider]  ezetimibe (ZETIA) 10 MG tablet Take 1 tablet (10 mg total) by mouth daily. 05/31/23   Chilton Si, MD  JARDIANCE 25 MG TABS tablet Take 25 mg by mouth daily. 12/06/19   [provider]  metoprolol succinate (TOPROL-XL) 25 MG 24 hr tablet Take 1 tablet (25 mg  total) by mouth daily. 06/20/23   Mathews Argyle, NP  midodrine (PROAMATINE) 2.5 MG tablet Take 1 tablet (2.5 mg total) by mouth 2 (two) times daily with a meal. 06/19/23   Mathews Argyle, NP  rosuvastatin (CRESTOR) 40 MG tablet Take 1 tablet (40 mg total) by mouth daily. Patient taking differently: Take 40 mg by mouth every evening. 05/15/21   Ihor Austin, NP  VITAMIN B COMPLEX-C PO Take 1 tablet by mouth daily.    [provider]      Allergies    Ticagrelor    Review of Systems   Review of Systems  Physical Exam Updated Vital Signs BP (!) 127/111   Pulse (!) 116   Temp (!) 97.5 F (36.4 C) (Oral)   Resp (!) 26   Ht 6' (1.829 m)   Wt 94.8 kg   SpO2 95%   BMI 28.35 kg/m  Physical Exam Constitutional:      Comments: Patient is resting and calm.  Mild increased work of breathing at rest.  HENT:     Head: Normocephalic and atraumatic.     Mouth/Throat:     Pharynx: Oropharynx is clear.  Eyes:     Extraocular Movements: Extraocular movements intact.  Cardiovascular:     Rate and Rhythm: Tachycardia present. Rhythm irregular.  Pulmonary:     Effort: Pulmonary effort is normal.     Breath sounds: Normal breath sounds.  Comments: Sounds are clear bilaterally with good airflow to the bases. Abdominal:     General: There is no distension.     Palpations: Abdomen is soft.     Tenderness: There is no abdominal tenderness. There is no guarding.  Musculoskeletal:     Comments: No significant peripheral edema.  Trace edema of the lower legs.  Patient has transmetatarsal amputation on the right.  Skin:    General: Skin is warm and dry.  Neurological:     Comments: Patient is resting but opens his eyes and interacts appropriately to verbal interaction.  He does seem to have poor recall for events.  No focal motor deficit at this time.  Patient is supine in bed he has not been ambulated.  He can follow instructions for assisting in sitting forward for exam.   Psychiatric:        Mood and Affect: Mood normal.     ED Results / Procedures / Treatments   Labs (all labs ordered are listed, but only abnormal results are displayed) Labs Reviewed  BASIC METABOLIC PANEL WITH GFR - Abnormal; Notable for the following components:      Result Value   Potassium 5.2 (*)    CO2 13 (*)    Glucose, Bld 127 (*)    BUN 109 (*)    Creatinine, Ser 3.80 (*)    GFR, Estimated 15 (*)    Anion gap 16 (*)    All other components within normal limits  CBC - Abnormal; Notable for the following components:   RBC 3.54 (*)    Hemoglobin 10.7 (*)    HCT 33.4 (*)    RDW 18.5 (*)    nRBC 8.7 (*)    All other components within normal limits  TROPONIN I (HIGH SENSITIVITY) - Abnormal; Notable for the following components:   Troponin I (High Sensitivity) 205 (*)    All other components within normal limits  BRAIN NATRIURETIC PEPTIDE  URINALYSIS, ROUTINE W REFLEX MICROSCOPIC  MAGNESIUM  PHOSPHORUS  TROPONIN I (HIGH SENSITIVITY)    EKG EKG Interpretation Date/Time:  Monday June 28 2023 10:43:09 EDT Ventricular Rate:  124 PR Interval:    QRS Duration:  113 QT Interval:  347 QTC Calculation: 519 R Axis:   147  Text Interpretation: Atrial fibrillation IRBBB and LPFB Anterolateral infarct, old Prolonged QT interval no sig change from previous Confirmed by Arby Barrette [phone]) on 06/28/2023 12:01:03 PM  Radiology DG Chest 2 View Result Date: 06/28/2023 CLINICAL DATA:  Chest pain. EXAM: CHEST - 2 VIEW COMPARISON:  Chest radiograph dated 03/10/2023. FINDINGS: Shallow inspiration with left lung base atelectasis. No pleural effusion or pneumothorax. Mild cardiomegaly. Aortic valve repair. Atherosclerotic calcification of the aorta. No acute osseous pathology. IMPRESSION: 1. Shallow inspiration with left lung base atelectasis. 2. Mild cardiomegaly. Electronically Signed   By: Elgie Collard M.D.   On: 06/28/2023 11:54    Procedures Procedures   CRITICAL  CARE Performed by: Arby Barrette   Total critical care time: 30 minutes  Critical care time was exclusive of separately billable procedures and treating other patients.  Critical care was necessary to treat or prevent imminent or life-threatening deterioration.  Critical care was time spent personally by me on the following activities: development of treatment plan with patient and/or surrogate as well as nursing, discussions with consultants, evaluation of patient's response to treatment, examination of patient, obtaining history from patient or surrogate, ordering and performing treatments and interventions, ordering and review of laboratory studies,  ordering and review of radiographic studies, pulse oximetry and re-evaluation of patient's condition.  Medications Ordered in ED Medications  metoprolol tartrate (LOPRESSOR) injection 5 mg (has no administration in time range)  metoprolol tartrate (LOPRESSOR) injection 5 mg (5 mg Intravenous Given 06/28/23 1228)    ED Course/ Medical Decision Making/ A&P                                 Medical Decision Making Amount and/or Complexity of Data Reviewed Labs: ordered. Radiology: ordered.  Risk Prescription drug management. Decision regarding hospitalization.   Patient presents as outlined with increasing shortness of breath and decreased energy level a week after discharge from the hospital.  Patient had new onset atrial fibrillation with medication changes.  Patient was started on Eliquis and metoprolol.  Patient's wife reports that she has doubled metoprolol per instructions over the past 2 days without improvement.  This morning she observed him to be more weak and felt that he might be close to passing out at 1 point.  On arrival patient's heart rate is in the 130s.  He has rapid atrial fibrillation.  EKG is similar to previous tracing but rate is elevated.  Will proceed with diagnostic evaluation for ACS\pneumonia\CHF.  Heart rates  are in the 130s and blood pressures 130s/110.  Will administer dose of Lopressor 5 mg IV.  Patient is lungs are clear and at this time does not appear to be in overt CHF.   Chest x-ray interpretation by radiology shallow inspiration left lung base atelectasis with mild cardiomegaly.  Patient had Lopressor 5 mg IV heart rate is vacillating lower at the low 110s but still picking up to 120s.  Will give additional 5 mg IV dose.  At this time with renal function elevated BUN 109 creatinine 3.8 significant decline since previous, will plan for admission for atrial fibrillation rapid response and acute on chronic renal failure.  Consult: Reviewed with Dr. Marlyce Huge [facility] for admission.          Final Clinical Impression(s) / ED Diagnoses Final diagnoses:  Atrial fibrillation with rapid ventricular response (HCC)  Acute renal failure superimposed on chronic kidney disease, unspecified acute renal failure type, unspecified CKD stage [facility])    Rx / DC Orders ED Discharge Orders     None         Arby Barrette, MD 06/28/23 1338

## 2023-06-28 NOTE — Consult Note (Addendum)
 Cardiology Consultation   Patient ID: Jahaan Vanwagner MRN: [id number]; DOB: 12-29-1933  Admit date: 06/28/2023 Date of Consult: 06/28/2023  PCP:  Renford Dills, MD   [facility] Providers Cardiologist:  Chilton Si, MD   {  Patient Profile:   Joshuan Bolander is a 89 y.o. male with a hx of chronic biventricular heart failure, atrial fibrillation on Eliquis, history of recent stroke 05/2023, hypertension, hyperlipidemia, diabetes, CKD stage 3b, PAD s/p partial right foot amputations, history of severe aortic stenosis s/p TAVR 2018, coronary calcifications with patent coronary arteries on cath 2017, tobacco abuse who is being seen 06/28/2023 for the evaluation of atrial fibrillation with RVR at the request of Dr. Austin Miles.  History of Present Illness:   Mr. Harada hast past medical history as stated above. He presented to the [facility] ED on 06/28/2023 for generalized weakness and shortness of breath. Patient was just recently admitted from 3/19-3/24/2025 for stroke thought to be cardioembolic in nature. He was found to be in atrial fibrillation on EKG during prior admission, echocardiogram did reveal severely dilated left atrium. He was started on Eliquis 2.5 mg BID prior to discharge. He was continued on Jardiance at discharge but his Entresto, spironolactone and Lasix were held.   Patient's wife reported that the patient has had decreased appetite over the last couple of days. They note that since being discharged from the hospital, with some of the medication changes that were made, he also felt that he was having more trouble breathing. Patients wife reported double beta-blocker dosage over the last two days without seeing any improvement in weakness and noted that he was close to passing out at one point. Wife stated that when she was instructed to increase his beta-blocker she was not checking his HR too often and thinks there is a chance that it was making him feel worse.   Upon  arrival to the ED he was found to be in atrial fibrillation with HR in the 130s and BP 130s/110s. Patient was given Lopressor 5 mg IV x 2 doses with mild improvement to HR, in 110s. Other relevant workup in the ED includes, hemoglobin at 10.7 (stable), BMP showed potassium 5.2, BUN 109 (increased from 28 one week ago), creatinine 3.80 (increased from 1.59), CXR showed mild cardiomegaly, left lung base atelectasis, Mag 3.5, phosphorus 7.5, BNP 2,409 (up from 229 three months ago), troponin 205 > 215, pending renal U/S.   Patient was admitted to medicine service and cardiology and nephrology were asked to consult.  Past Medical History:  Diagnosis Date   Anemia    Anxiety    situational- surgery   Asthma    as a child   Cancer (HCC) 2019   colon- colectomy    CHF (congestive heart failure) (HCC)    Chronic kidney disease    followed by Dr. Nehemiah Settle   Coronary artery disease    Diabetes mellitus without complication (HCC)    Type II   Dyslipidemia 10/27/2015   Dyspnea    w/ exertion    Elevated PSA 10/27/2015   Erectile dysfunction 10/27/2015   Heart murmur    Hypertension 10/27/2015   Hypogonadism male 10/27/2015   Neuropathy    Obesity 10/27/2015   Peripheral vascular disease (HCC)    Pneumonia 10/27/2015   pt states was 1982   Rotator cuff tear 10/27/2015   Past Surgical History:  Procedure Laterality Date   ABDOMINAL AORTOGRAM N/A 02/01/2019   Procedure: ABDOMINAL AORTOGRAM;  Surgeon: Sherald Hess  J, MD;  Location: [facility] INVASIVE CV LAB;  Service: Cardiovascular;  Laterality: N/A;   AMPUTATION Right 02/03/2019   Procedure: RIGHT FOOT 1ST RAY AMPUTATION;  Surgeon: Nadara Mustard, MD;  Location: [facility] OR;  Service: Orthopedics;  Laterality: Right;   AMPUTATION Right 08/02/2019   Procedure: RIGHT 2ND TOE AMPUTATION;  Surgeon: Nadara Mustard, MD;  Location: [facility] OR;  Service: Orthopedics;  Laterality: Right;   AMPUTATION Right 11/08/2020   Procedure: RIGHT TRANSMETATARSAL AMPUTATION;   Surgeon: Nadara Mustard, MD;  Location: [facility] OR;  Service: Orthopedics;  Laterality: Right;   APPLICATION OF WOUND VAC  11/08/2020   Procedure: APPLICATION OF WOUND VAC;  Surgeon: Nadara Mustard, MD;  Location: [facility] OR;  Service: Orthopedics;;   CARDIAC CATHETERIZATION N/A 11/14/2015   Procedure: Left Heart Cath and Coronary Angiography;  Surgeon: Lyn Records, MD;  Location: [facility] INVASIVE CV LAB;  Service: Cardiovascular;  Laterality: N/A;   COLONOSCOPY     LAPAROSCOPIC PARTIAL COLECTOMY  12/15/2017   LAPAROSCOPIC PARTIAL COLECTOMY (N/A Abdomen)   LAPAROSCOPIC PARTIAL COLECTOMY N/A 12/15/2017   Procedure: LAPAROSCOPIC PARTIAL COLECTOMY;  Surgeon: Harriette Bouillon, MD;  Location: [facility] OR;  Service: General;  Laterality: N/A;   LOWER EXTREMITY ANGIOGRAPHY Right 02/01/2019   Procedure: LOWER EXTREMITY ANGIOGRAPHY;  Surgeon: Cephus Shelling, MD;  Location: [facility] INVASIVE CV LAB;  Service: Cardiovascular;  Laterality: Right;   PERIPHERAL VASCULAR INTERVENTION Right 02/01/2019   Procedure: PERIPHERAL VASCULAR INTERVENTION;  Surgeon: Cephus Shelling, MD;  Location: [facility] INVASIVE CV LAB;  Service: Cardiovascular;  Laterality: Right;  SFA   RIGHT HEART CATH AND CORONARY ANGIOGRAPHY N/A 03/11/2023   Procedure: RIGHT HEART CATH AND CORONARY ANGIOGRAPHY;  Surgeon: Kathleene Hazel, MD;  Location: [facility] INVASIVE CV LAB;  Service: Cardiovascular;  Laterality: N/A;   TEE WITHOUT CARDIOVERSION N/A 12/15/2016   Procedure: TRANSESOPHAGEAL ECHOCARDIOGRAM (TEE);  Surgeon: Tonny Bollman, MD;  Location: [facility] OR;  Service: Open Heart Surgery;  Laterality: N/A;   TRANSCATHETER AORTIC VALVE REPLACEMENT, TRANSFEMORAL N/A 12/15/2016   Procedure: TRANSCATHETER AORTIC VALVE REPLACEMENT, TRANSFEMORAL;  Surgeon: Tonny Bollman, MD;  Location: [facility] OR;  Service: Open Heart Surgery;  Laterality: N/A;    Home Medications:  Prior to Admission medications   Medication Sig Start Date End Date Taking? Authorizing Provider  apixaban  (ELIQUIS) 2.5 MG TABS tablet Take 1 tablet (2.5 mg total) by mouth 2 (two) times daily. 06/19/23   Mathews Argyle, NP  benzonatate (TESSALON) 100 MG capsule Take 100 mg by mouth 3 (three) times daily as needed for cough. 06/11/23   [provider]  Cholecalciferol (VITAMIN D) 50 MCG (2000 UT) tablet Take 2,000 Units by mouth daily.    [provider]  diclofenac Sodium (VOLTAREN) 1 % GEL Apply 1 Application topically as needed (pain). 03/03/22   [provider]  ezetimibe (ZETIA) 10 MG tablet Take 1 tablet (10 mg total) by mouth daily. 05/31/23   Chilton Si, MD  furosemide (LASIX) 80 MG tablet Take 40 mg by mouth every other day. Alternating with 40mg  tablet 03/12/23   [provider]  JARDIANCE 25 MG TABS tablet Take 25 mg by mouth daily. 12/06/19   [provider]  metoprolol succinate (TOPROL-XL) 25 MG 24 hr tablet Take 1 tablet (25 mg total) by mouth daily. 06/20/23   Mathews Argyle, NP  midodrine (PROAMATINE) 2.5 MG tablet Take 1 tablet (2.5 mg total) by mouth 2 (two) times daily with a meal. 06/19/23   Gevena Mart  A, NP  rosuvastatin (CRESTOR) 40 MG tablet Take 1 tablet (40 mg total) by mouth daily. Patient taking differently: Take 40 mg by mouth every evening. 05/15/21   Ihor Austin, NP  VITAMIN B COMPLEX-C PO Take 1 tablet by mouth daily.    [provider]   Inpatient Medications: Scheduled Meds:  apixaban  2.5 mg Oral BID   ezetimibe  10 mg Oral Daily   metoprolol tartrate  12.5 mg Oral BID   rosuvastatin  40 mg Oral QPM   Continuous Infusions:  sodium bicarbonate 150 mEq in dextrose 5 % 1,150 mL infusion     sodium chloride     PRN Meds: acetaminophen **OR** acetaminophen  Allergies:    Allergies  Allergen Reactions   Ticagrelor Other (See Comments)    Unknown    Social History:   Social History   Socioeconomic History   Marital status: Married    Spouse name: Alice   Number of children: 2   Years of  education: Not on file   Highest education level: Master's degree (e.g., MA, MS, MEng, MEd, MSW, MBA)  Occupational History   Occupation: Part-time  Tobacco Use   Smoking status: Former    Types: Cigarettes    Passive exposure: Never   Smokeless tobacco: Never  Vaping Use   Vaping status: Never Used  Substance and Sexual Activity   Alcohol use: Yes    Comment: rarely   Drug use: No   Sexual activity: Not on file  Other Topics Concern   Not on file  Social History Narrative   Not on file   Social Drivers of Health   Financial Resource Strain: Low Risk  (03/10/2023)   Overall Financial Resource Strain (CARDIA)    Difficulty of Paying Living Expenses: Not hard at all  Food Insecurity: No Food Insecurity (06/28/2023)   Hunger Vital Sign    Worried About Running Out of Food in the Last Year: Never true    Ran Out of Food in the Last Year: Never true  Transportation Needs: No Transportation Needs (06/28/2023)   PRAPARE - Administrator, Civil Service (Medical): No    Lack of Transportation (Non-Medical): No  Physical Activity: Not on file  Stress: Not on file  Social Connections: Moderately Integrated (06/28/2023)   Social Connection and Isolation Panel [NHANES]    Frequency of Communication with Friends and Family: More than three times a week    Frequency of Social Gatherings with Friends and Family: More than three times a week    Attends Religious Services: Never    Database administrator or Organizations: Yes    Attends Banker Meetings: Never    Marital Status: Married  Catering manager Violence: Not At Risk (06/28/2023)   Humiliation, Afraid, Rape, and Kick questionnaire    Fear of Current or Ex-Partner: No    Emotionally Abused: No    Physically Abused: No    Sexually Abused: No    Family History:   Family History  Problem Relation Age of Onset   Diabetes Mother    Heart disease Mother    Pulmonary embolism Father     ROS:  Please see  the history of present illness.  All other ROS reviewed and negative.     Physical Exam/Data:   Vitals:   06/28/23 1330 06/28/23 1345 06/28/23 1400 06/28/23 1450  BP: (!) 120/92 (!) 133/117 (!) 115/95   Pulse: 83 (!) 114 (!) 118  Resp: (!) 27 (!) 26 (!) 23   Temp:    97.7 F (36.5 C)  TempSrc:    Oral  SpO2: 100% 100% 98%   Weight:      Height:       No intake or output data in the 24 hours ending 06/28/23 1541    06/28/2023   10:23 AM 06/21/2023    9:00 AM 06/16/2023   11:00 AM  Last 3 Weights  Weight (lbs) 209 lb 217 lb 6 oz 217 lb 6 oz  Weight (kg) 94.802 kg 98.6 kg 98.6 kg     Body mass index is 28.35 kg/m.  General:  Well nourished, well developed, in no acute distress, on room air  HEENT: normal Neck: no JVD Vascular: Distal pulses 2+ bilaterally Cardiac:   irregularly irregular rhythm, tachycardic; no murmur  Lungs:  clear to auscultation bilaterally, no wheezing, rhonchi or rales  Abd: soft, nontender, no hepatomegaly  Ext: mild bilateral LE edema Musculoskeletal:  No deformities Skin: warm and dry  Neuro:  no focal abnormalities noted Psych:  Normal affect   Telemetry:  Telemetry was personally reviewed and demonstrates:  atrial fibrillation with HR 90-110s, frequent PVCs   Relevant CV Studies: Echocardiogram, 06/17/2023 IMPRESSIONS  1. Left ventricular ejection fraction, by estimation, is 25 to 30% . The left ventricle has severely decreased function. The left ventricle demonstrates global hypokinesis. The left ventricular internal cavity size was mildly dilated. Left ventricular diastolic function could not be evaluated.  2. Right ventricular systolic function reduced. The right ventricular size is normal.  3. Left atrial size was severely dilated.  4. Right atrial size was moderately dilated.  5. A small pericardial effusion is present. The pericardial effusion is posterior to the left ventricle. There is no evidence of cardiac tamponade.  6. The mitral  valve is grossly normal. Mild mitral valve regurgitation. No evidence of mitral stenosis.  7. Tricuspid valve regurgitation is mild to moderate.  8. S/ p 26mm Edwards Sapien TAVR ( implanted 09/ 18/ 2018) , well seated, no perivalvular leak, no aortic regurgitation, low flow- low gradient aortic stenosis cannot be ruled out ( peak velocity, 2. 48m/ s, PG , MG , AVA VTI 1. 1cm2, DI 0. 32, Accleration time , SVi 16) .  9. Ascending aorta is structurally normal, with no evidence of dilitation.  Comparison(s) : A prior study was performed on 12/ 10/ 2024. No significant change from prior study.  Laboratory Data:  High Sensitivity Troponin:   Recent Labs  Lab 06/28/23 1034 06/28/23 1228  TROPONINIHS 205* 215*     Chemistry Recent Labs  Lab 06/28/23 1034 06/28/23 1228  NA 138  --   K 5.2*  --   CL 109  --   CO2 13*  --   GLUCOSE 127*  --   BUN 109*  --   CREATININE 3.80*  --   CALCIUM 9.1  --   MG  --  3.5*  GFRNONAA 15*  --   ANIONGAP 16*  --     No results for input(s): "PROT", "ALBUMIN", "AST", "ALT", "ALKPHOS", "BILITOT" in the last 168 hours. Lipids No results for input(s): "CHOL", "TRIG", "HDL", "LABVLDL", "LDLCALC", "CHOLHDL" in the last 168 hours.  Hematology Recent Labs  Lab 06/28/23 1034  WBC 8.3  RBC 3.54*  HGB 10.7*  HCT 33.4*  MCV 94.4  MCH 30.2  MCHC 32.0  RDW 18.5*  PLT PLATELET CLUMPS NOTED ON SMEAR, UNABLE TO ESTIMATE   Thyroid  No results for input(s): "TSH", "FREET4" in the last 168 hours.  BNP Recent Labs  Lab 06/28/23 1228  BNP 2,409.7*    DDimer No results for input(s): "DDIMER" in the last 168 hours.  Radiology/Studies:  DG Chest 2 View Result Date: 06/28/2023 CLINICAL DATA:  Chest pain. EXAM: CHEST - 2 VIEW COMPARISON:  Chest radiograph dated 03/10/2023. FINDINGS: Shallow inspiration with left lung base atelectasis. No pleural effusion or pneumothorax. Mild cardiomegaly. Aortic valve repair. Atherosclerotic calcification of  the aorta. No acute osseous pathology. IMPRESSION: 1. Shallow inspiration with left lung base atelectasis. 2. Mild cardiomegaly. Electronically Signed   By: Elgie Collard M.D.   On: 06/28/2023 11:54   Assessment and Plan:   Chronic biventricular heart failure  Nonischemic cardiomyopathy RHC/LHC from 02/2023 showed: no angiographic evidence of CAD Echo from 05/2023 showed: EF 25-30%, global hypokinesis, reduced RV function, biatrial enlargement, small pericardial effusion Not currently appearing volume overloaded -- considering labs and history likely very dry inside and needs fluids  He was laying flat in the bed, with no difficulty breathing, currently on room air -- states he is feeling good  BNP 2,409, elevated but unreliable in the setting of acute renal failure  Continue strict I&O's and daily weights, daily BMPs Patient is going to undergo volume challenge per nephrology  Will add back GDMT as tolerated with renal function Starting on PO Lopressor 12.5 mg BID with hold parameters given history of hypotension -- will monitor HR and BP response and adjust accordingly   Dyspnea Unclear etiology Appears euvolemic on exam  Not currently requiring any oxygen, SpO2 96-100% on room air  Patient's wife reports that he is already improved compared to early in terms of shortness of breath Does not appear to be cardiac in nature  Further workup and treatment per primary   Atrial fibrillation, recently diagnosed 05/2023 Anticoagulated on low-dose Eliquis  CHA2DS2-VASc score is 8  Found to be in A. Fib with RVR during last admission to the hospital 05/2023 Currently on Eliquis 2.5 mg BID -- appropriate dose given age and renal function   Elevated troponin 205 > 215 Likely in the setting of demand ischemia secondary to tachyarrhythmia  Patient denies any current or prior chest pain  EKG showed no acute ischemic changes   Hypertension Most recent BP 115/95 with HR 109 Patient had been  holding most of his GDMT since being discharged from the hospital 3/24 but had been taking Toprol 25 mg daily -- possibly taking 50 mg daily the last couple of days as instructed by PCP  At one point was started on midodrine for hypotension so will be cautious with re-starting beta blocker  Patient has been given IV metoprolol 5 mg x 2 doses in the ED Starting back on PO Lopressor 12.5 mg BID, will monitor response   Hyperlipidemia  06/16/2023: ALT 30 06/17/2023: HDL 39; LDL Cholesterol 66  Continue Zetia 10 mg daily  Continue Crestor 40 mg daily   PAD s/p partial right foot amputations 01/2023 Severe AS s/p 26mm Edwards Sapien TAVR 11/2016  Echo 05/2023 showed: mild MR, mild to moderate TR, no perivalvular leak, no AR No current symptoms, continue to follow   Per primary AKI on CKD stage 3b Electrolyte disturbances  History of stroke, 05/2023 History of TIA Type 2 diabetes History of colon cancer s/p colectomy   Risk Assessment/Risk Scores:      New York Heart Association (NYHA) Functional Class NYHA Class II  CHA2DS2-VASc Score = 8  This indicates a 10.8% annual risk of stroke. The patient's score is based upon: CHF History: 1 HTN History: 1 Diabetes History: 1 Stroke History: 2 Vascular Disease History: 1 Age Score: 2 Gender Score: 0      For questions or updates, please contact [facility] Please consult [email or web address] for contact info under    Signed, Olena Leatherwood, PA-C  06/28/2023 3:41 PM  Patient seen and examined with Evlyn Clines PA-C.  Agree as above, with the following exceptions and changes as noted below.  89 year old male with a history of nonischemic cardiomyopathy, atrial fibrillation on Eliquis with recent stroke March 2025, hypertension, hyperlipidemia, diabetes, CKD stage IIIb, PAD status post partial right foot amputation, history of severe AS with TAVR in 2018, nonobstructive CAD in 2017 seen for atrial fibrillation with rapid  ventricular response and concern for equivocal volume status in the setting of acute renal failure on chronic kidney disease.  Patient was recently discharged from the hospital after stroke at which point due to hypotension his GDMT has been held.  He did however continue his Lasix, but over the last 10 days had poor p.o. intake, not able to keep down solid food nor fluids well.  He was due to see cardiology today but he felt unwell enough that his wife drove him to the hospital instead.  In ED he was noted to have acute renal failure on chronic kidney disease, and overall presentation most consistent with poor oral intake and dehydration.  Nephrology has seen and recommended fluid resuscitation.  Patient also noted to have atrial fibrillation, under rate control strategy upon dismissal from the hospital.  Recently the patient's primary care physician recommended up titration of beta-blockade likely due to uncontrolled rates, though now in hindsight elevated rates were likely secondary to dehydration.  Rates are improving with hydration and intermittent IV boluses of metoprolol.  Tachycardia may be somewhat compensatory.  Gen: NAD, CV: RRR, no murmurs, Lungs: clear, Abd: soft, Extrem: Warm, right partial foot amputation no edema, Neuro/Psych: Drowsy and hard of hearing but arouses to verbal stimuli, mentation clear.  All available labs, radiology testing, previous records reviewed.  Mild increased work of breathing.  Patient will receive volume resuscitation today and we have reinstituted a low-dose of beta-blockade once volume is better repleted.  Diastolic blood pressures are elevated, he likely does not need ongoing midodrine.  Okay to hold the remainder of his GDMT until renal function improves.  Unfortunately he missed a dose of Eliquis this morning, this will be restarted this evening, this may be pertinent in the event we need to do cardioversion.  He may likely need a TEE cardioversion towards the end of his  hospital stay if rate control remains challenging.  Remainder as above.  Parke Poisson, MD 06/28/23 5:43 PM

## 2023-06-29 ENCOUNTER — Inpatient Hospital Stay (HOSPITAL_COMMUNITY)

## 2023-06-29 DIAGNOSIS — R0600 Dyspnea, unspecified: Secondary | ICD-10-CM | POA: Diagnosis not present

## 2023-06-29 DIAGNOSIS — N179 Acute kidney failure, unspecified: Secondary | ICD-10-CM | POA: Diagnosis not present

## 2023-06-29 DIAGNOSIS — I2489 Other forms of acute ischemic heart disease: Secondary | ICD-10-CM | POA: Diagnosis not present

## 2023-06-29 DIAGNOSIS — I517 Cardiomegaly: Secondary | ICD-10-CM | POA: Diagnosis not present

## 2023-06-29 DIAGNOSIS — N189 Chronic kidney disease, unspecified: Secondary | ICD-10-CM

## 2023-06-29 DIAGNOSIS — N1832 Chronic kidney disease, stage 3b: Secondary | ICD-10-CM | POA: Diagnosis not present

## 2023-06-29 DIAGNOSIS — D649 Anemia, unspecified: Secondary | ICD-10-CM | POA: Diagnosis not present

## 2023-06-29 DIAGNOSIS — E1122 Type 2 diabetes mellitus with diabetic chronic kidney disease: Secondary | ICD-10-CM | POA: Diagnosis not present

## 2023-06-29 DIAGNOSIS — R7989 Other specified abnormal findings of blood chemistry: Secondary | ICD-10-CM | POA: Diagnosis not present

## 2023-06-29 DIAGNOSIS — Z4901 Encounter for fitting and adjustment of extracorporeal dialysis catheter: Secondary | ICD-10-CM | POA: Diagnosis not present

## 2023-06-29 DIAGNOSIS — N186 End stage renal disease: Secondary | ICD-10-CM | POA: Diagnosis not present

## 2023-06-29 DIAGNOSIS — I5042 Chronic combined systolic (congestive) and diastolic (congestive) heart failure: Secondary | ICD-10-CM | POA: Diagnosis not present

## 2023-06-29 DIAGNOSIS — I10 Essential (primary) hypertension: Secondary | ICD-10-CM | POA: Diagnosis not present

## 2023-06-29 DIAGNOSIS — Z515 Encounter for palliative care: Secondary | ICD-10-CM | POA: Diagnosis not present

## 2023-06-29 DIAGNOSIS — J9601 Acute respiratory failure with hypoxia: Secondary | ICD-10-CM | POA: Diagnosis not present

## 2023-06-29 DIAGNOSIS — Z4682 Encounter for fitting and adjustment of non-vascular catheter: Secondary | ICD-10-CM | POA: Diagnosis not present

## 2023-06-29 DIAGNOSIS — E860 Dehydration: Secondary | ICD-10-CM | POA: Diagnosis not present

## 2023-06-29 DIAGNOSIS — D638 Anemia in other chronic diseases classified elsewhere: Secondary | ICD-10-CM | POA: Diagnosis not present

## 2023-06-29 DIAGNOSIS — N390 Urinary tract infection, site not specified: Secondary | ICD-10-CM | POA: Diagnosis not present

## 2023-06-29 DIAGNOSIS — R7401 Elevation of levels of liver transaminase levels: Secondary | ICD-10-CM | POA: Diagnosis not present

## 2023-06-29 DIAGNOSIS — I132 Hypertensive heart and chronic kidney disease with heart failure and with stage 5 chronic kidney disease, or end stage renal disease: Secondary | ICD-10-CM | POA: Diagnosis not present

## 2023-06-29 DIAGNOSIS — I4819 Other persistent atrial fibrillation: Secondary | ICD-10-CM | POA: Diagnosis not present

## 2023-06-29 DIAGNOSIS — E872 Acidosis, unspecified: Secondary | ICD-10-CM | POA: Diagnosis not present

## 2023-06-29 DIAGNOSIS — Z952 Presence of prosthetic heart valve: Secondary | ICD-10-CM | POA: Diagnosis not present

## 2023-06-29 DIAGNOSIS — I509 Heart failure, unspecified: Secondary | ICD-10-CM | POA: Diagnosis not present

## 2023-06-29 DIAGNOSIS — I5022 Chronic systolic (congestive) heart failure: Secondary | ICD-10-CM | POA: Diagnosis not present

## 2023-06-29 DIAGNOSIS — I70201 Unspecified atherosclerosis of native arteries of extremities, right leg: Secondary | ICD-10-CM | POA: Diagnosis not present

## 2023-06-29 DIAGNOSIS — R0602 Shortness of breath: Secondary | ICD-10-CM | POA: Diagnosis not present

## 2023-06-29 DIAGNOSIS — N170 Acute kidney failure with tubular necrosis: Secondary | ICD-10-CM | POA: Diagnosis not present

## 2023-06-29 DIAGNOSIS — J9811 Atelectasis: Secondary | ICD-10-CM | POA: Diagnosis not present

## 2023-06-29 DIAGNOSIS — R579 Shock, unspecified: Secondary | ICD-10-CM | POA: Diagnosis not present

## 2023-06-29 DIAGNOSIS — R570 Cardiogenic shock: Secondary | ICD-10-CM | POA: Diagnosis not present

## 2023-06-29 DIAGNOSIS — E1165 Type 2 diabetes mellitus with hyperglycemia: Secondary | ICD-10-CM | POA: Diagnosis not present

## 2023-06-29 DIAGNOSIS — R531 Weakness: Secondary | ICD-10-CM | POA: Diagnosis not present

## 2023-06-29 DIAGNOSIS — I5020 Unspecified systolic (congestive) heart failure: Secondary | ICD-10-CM | POA: Diagnosis not present

## 2023-06-29 DIAGNOSIS — Z452 Encounter for adjustment and management of vascular access device: Secondary | ICD-10-CM | POA: Diagnosis not present

## 2023-06-29 DIAGNOSIS — R0989 Other specified symptoms and signs involving the circulatory and respiratory systems: Secondary | ICD-10-CM | POA: Diagnosis not present

## 2023-06-29 DIAGNOSIS — I5082 Biventricular heart failure: Secondary | ICD-10-CM | POA: Diagnosis not present

## 2023-06-29 DIAGNOSIS — Z66 Do not resuscitate: Secondary | ICD-10-CM | POA: Diagnosis not present

## 2023-06-29 DIAGNOSIS — E785 Hyperlipidemia, unspecified: Secondary | ICD-10-CM | POA: Diagnosis not present

## 2023-06-29 DIAGNOSIS — R918 Other nonspecific abnormal finding of lung field: Secondary | ICD-10-CM | POA: Diagnosis not present

## 2023-06-29 DIAGNOSIS — I428 Other cardiomyopathies: Secondary | ICD-10-CM | POA: Diagnosis not present

## 2023-06-29 DIAGNOSIS — I700 Atherosclerosis of aorta: Secondary | ICD-10-CM | POA: Diagnosis not present

## 2023-06-29 DIAGNOSIS — K7200 Acute and subacute hepatic failure without coma: Secondary | ICD-10-CM | POA: Diagnosis not present

## 2023-06-29 DIAGNOSIS — E875 Hyperkalemia: Secondary | ICD-10-CM | POA: Diagnosis not present

## 2023-06-29 DIAGNOSIS — R092 Respiratory arrest: Secondary | ICD-10-CM | POA: Diagnosis not present

## 2023-06-29 DIAGNOSIS — J45909 Unspecified asthma, uncomplicated: Secondary | ICD-10-CM | POA: Diagnosis not present

## 2023-06-29 DIAGNOSIS — D631 Anemia in chronic kidney disease: Secondary | ICD-10-CM | POA: Diagnosis not present

## 2023-06-29 DIAGNOSIS — I469 Cardiac arrest, cause unspecified: Secondary | ICD-10-CM | POA: Diagnosis not present

## 2023-06-29 DIAGNOSIS — I4891 Unspecified atrial fibrillation: Secondary | ICD-10-CM | POA: Diagnosis not present

## 2023-06-29 DIAGNOSIS — G9341 Metabolic encephalopathy: Secondary | ICD-10-CM | POA: Diagnosis not present

## 2023-06-29 DIAGNOSIS — E669 Obesity, unspecified: Secondary | ICD-10-CM | POA: Diagnosis not present

## 2023-06-29 LAB — CBC
HCT: 32.8 % — ABNORMAL LOW (ref 39.0–52.0)
Hemoglobin: 10.5 g/dL — ABNORMAL LOW (ref 13.0–17.0)
MCH: 29.7 pg (ref 26.0–34.0)
MCHC: 32 g/dL (ref 30.0–36.0)
MCV: 92.7 fL (ref 80.0–100.0)
Platelets: UNDETERMINED 10*3/uL (ref 150–400)
RBC: 3.54 MIL/uL — ABNORMAL LOW (ref 4.22–5.81)
RDW: 18.9 % — ABNORMAL HIGH (ref 11.5–15.5)
WBC: 8 10*3/uL (ref 4.0–10.5)
nRBC: 12.6 % — ABNORMAL HIGH (ref 0.0–0.2)

## 2023-06-29 LAB — URINALYSIS, W/ REFLEX TO CULTURE (INFECTION SUSPECTED)
Bilirubin Urine: NEGATIVE
Glucose, UA: >=500 mg/dL — AB
Ketones, ur: NEGATIVE mg/dL
Nitrite: NEGATIVE
Protein, ur: 100 mg/dL — AB
RBC / HPF: >50 RBC/hpf (ref 0–5)
Specific Gravity, Urine: 1.011 (ref 1.005–1.030)
WBC, UA: >50 WBC/hpf (ref 0–5)
pH: 5 (ref 5.0–8.0)

## 2023-06-29 LAB — BASIC METABOLIC PANEL WITH GFR
Anion gap: 16 — ABNORMAL HIGH (ref 5–15)
BUN: 123 mg/dL — ABNORMAL HIGH (ref 8–23)
CO2: 15 mmol/L — ABNORMAL LOW (ref 22–32)
Calcium: 8.6 mg/dL — ABNORMAL LOW (ref 8.9–10.3)
Chloride: 107 mmol/L (ref 98–111)
Creatinine, Ser: 4.26 mg/dL — ABNORMAL HIGH (ref 0.61–1.24)
GFR, Estimated: 13 mL/min — ABNORMAL LOW (ref >60)
Glucose, Bld: 119 mg/dL — ABNORMAL HIGH (ref 70–99)
Potassium: 5.3 mmol/L — ABNORMAL HIGH (ref 3.5–5.1)
Sodium: 138 mmol/L (ref 135–145)

## 2023-06-29 LAB — GLUCOSE, CAPILLARY
Glucose-Capillary: 118 mg/dL — ABNORMAL HIGH (ref 70–99)
Glucose-Capillary: 125 mg/dL — ABNORMAL HIGH (ref 70–99)
Glucose-Capillary: 131 mg/dL — ABNORMAL HIGH (ref 70–99)
Glucose-Capillary: 128 mg/dL — ABNORMAL HIGH (ref 70–99)

## 2023-06-29 LAB — LACTIC ACID, PLASMA: Lactic Acid, Venous: 2.8 mmol/L (ref 0.5–1.9)

## 2023-06-29 MED ORDER — METOPROLOL TARTRATE 5 MG/5ML IV SOLN: 2.5000 mg | INTRAVENOUS | Status: DC | PRN

## 2023-06-29 MED ORDER — LORAZEPAM 2 MG/ML IJ SOLN
0.5000 mg | INTRAMUSCULAR | Status: DC | PRN
  Administered 2023-06-29 – 2023-06-30 (×4): 0.5 mg via INTRAVENOUS
  Filled 2023-06-29 (×5): qty 1

## 2023-06-29 MED ORDER — METOPROLOL TARTRATE 5 MG/5ML IV SOLN: 2.5000 mg | Freq: Four times a day (QID) | INTRAVENOUS | Status: DC | PRN

## 2023-06-29 MED ORDER — LIDOCAINE-EPINEPHRINE 1 %-1:100000 IJ SOLN
INTRAMUSCULAR | Status: AC
  Filled 2023-06-29: qty 1

## 2023-06-29 MED ORDER — METOPROLOL TARTRATE 5 MG/5ML IV SOLN
5.0000 mg | Freq: Four times a day (QID) | INTRAVENOUS | Status: DC
  Administered 2023-06-29 – 2023-06-30 (×3): 5 mg via INTRAVENOUS
  Filled 2023-06-29 (×3): qty 5

## 2023-06-29 MED ORDER — HYDROMORPHONE HCL 1 MG/ML IJ SOLN
0.5000 mg | INTRAMUSCULAR | Status: DC | PRN
  Administered 2023-06-29 – 2023-07-01 (×3): 0.5 mg via INTRAVENOUS
  Filled 2023-06-29 (×2): qty 1
  Filled 2023-06-29: qty 0.5

## 2023-06-29 MED ORDER — SODIUM CHLORIDE 0.9 % IV SOLN
1.0000 g | INTRAVENOUS | Status: DC
  Administered 2023-06-29 – 2023-07-01 (×3): 1 g via INTRAVENOUS
  Filled 2023-06-29 (×3): qty 10

## 2023-06-29 MED ORDER — HEPARIN SODIUM (PORCINE) 1000 UNIT/ML IJ SOLN
INTRAMUSCULAR | Status: AC
  Filled 2023-06-29: qty 10

## 2023-06-29 MED ORDER — SODIUM ZIRCONIUM CYCLOSILICATE 10 G PO PACK
10.0000 g | PACK | Freq: Once | ORAL | Status: AC
  Administered 2023-06-29: 10 g via ORAL
  Filled 2023-06-29: qty 1

## 2023-06-29 MED ORDER — SODIUM BICARBONATE 8.4 % IV SOLN
INTRAVENOUS | Status: AC
  Filled 2023-06-29 (×2): qty 1000

## 2023-06-29 MED ORDER — OXYCODONE HCL 5 MG PO TABS
5.0000 mg | ORAL_TABLET | ORAL | Status: DC | PRN
  Administered 2023-06-29: 5 mg via ORAL
  Filled 2023-06-29: qty 1

## 2023-06-29 MED ORDER — METOPROLOL TARTRATE 25 MG PO TABS
25.0000 mg | ORAL_TABLET | Freq: Two times a day (BID) | ORAL | Status: DC
  Filled 2023-06-29: qty 1

## 2023-06-29 NOTE — TOC Initial Note (Signed)
 Transition of Care [facility]) - Initial/Assessment Note    Patient Details  Name: Robert Horn MRN: [id number] Date of Birth: 1934-01-14  Transition of Care [facility]) CM/SW Contact:    Gala Lewandowsky, RN Phone Number: 06/29/2023, 3:26 PM  Clinical Narrative: Risk for readmission assessment completed. Patient presented for shortness of breath and generalized weakness. PTA patient was from home with spouse. Patient is currently active with [facility] for speech therapy- adding PT/OT/RN since the patient is a readmission. Patient will need resumption orders once stable. Patient has DME cane, wheelchair and walker. Case Manager will continue to follow for additional needs as the patient progresses.                 Expected Discharge Plan: Home w Home Health Services Barriers to Discharge: Continued Medical Work up  Expected Discharge Plan and Services   Discharge Planning Services: CM Consult Post Acute Care Choice: Home Health, Resumption of Svcs/PTA Provider Living arrangements for the past 2 months: Single Family Home                   DME Agency: NA       HH Arranged: PT, OT, RN, Disease Management, Speech Therapy HH Agency: [facility] Date [facility] Agency Contacted: 06/29/23 Time HH Agency Contacted: 1525 Representative spoke with at [facility] Agency: Amy  Prior Living Arrangements/Services Living arrangements for the past 2 months: Single Family Home Lives with:: Spouse Patient language and need for interpreter reviewed:: Yes Do you feel safe going back to the place where you live?: Yes      Need for Family Participation in Patient Care: Yes (Comment) Care giver support system in place?: Yes (comment) Current home services: DME (wheelchair, cane, rolling walker) Criminal Activity/Legal Involvement Pertinent to Current Situation/Hospitalization: No - Comment as needed  Activities of Daily Living   ADL Screening (condition at time of admission) Independently  performs ADLs?: No Does the patient have a NEW difficulty with bathing/dressing/toileting/self-feeding that is expected to last >3 days?: No (needs assist) Does the patient have a NEW difficulty with getting in/out of bed, walking, or climbing stairs that is expected to last >3 days?: No (needs assist up with walker) Does the patient have a NEW difficulty with communication that is expected to last >3 days?: No Is the patient deaf or have difficulty hearing?: Yes Does the patient have difficulty seeing, even when wearing glasses/contacts?: No Does the patient have difficulty concentrating, remembering, or making decisions?: Yes  Permission Sought/Granted Permission sought to share information with : Family Supports, Case Production designer, theatre/television/film, Oceanographer granted to share information with : Yes, Verbal Permission Granted     Permission granted to share info w AGENCY: [facility]        Emotional Assessment Appearance:: Appears stated age       Alcohol / Substance Use: Not Applicable Psych Involvement: No (comment)  Admission diagnosis:  Atrial fibrillation with rapid ventricular response (HCC) [I48.91] Atrial fibrillation with RVR (HCC) [I48.91] Acute renal failure superimposed on chronic kidney disease, unspecified acute renal failure type, unspecified CKD stage (HCC) [N17.9, N18.9] AKI (acute kidney injury) (HCC) [N17.9] Patient Active Problem List   Diagnosis Date Noted   Atrial fibrillation with RVR (HCC) 06/28/2023   Atrial fibrillation, new onset (HCC) 06/20/2023   HFrEF (heart failure with reduced ejection fraction) (HCC) 06/20/2023   Hypotension 06/20/2023   Expressive aphasia 06/19/2023   Cerebral infarction due to embolism of bilateral middle cerebral arteries (HCC) 06/16/2023  COVID-19 virus infection 03/16/2021   TIA (transient ischemic attack) 03/12/2021   Diabetes mellitus type 2, controlled (HCC) 03/12/2021   Right foot ulcer, limited to breakdown  of skin (HCC) 02/28/2020   Osteomyelitis of second toe of right foot (HCC)    Osteomyelitis of great toe of right foot (HCC)    Severe protein-calorie malnutrition (HCC)    Diabetic polyneuropathy associated with type 2 diabetes mellitus (HCC)    Cutaneous abscess of right foot    Cellulitis of right foot 01/30/2019   CKD (chronic kidney disease), stage III (HCC) 01/30/2019   Elevated LFTs 01/30/2019   Colon cancer (HCC) 01/30/2019   Anemia of chronic disease 01/30/2019   Acute on chronic combined systolic and diastolic CHF (congestive heart failure) (HCC) 03/09/2018   AKI (acute kidney injury) (HCC) 03/09/2018   Colonic mass 12/10/2017   S/P TAVR (transcatheter aortic valve replacement) 03/03/2017   Severe aortic stenosis 12/15/2016   Essential hypertension 05/09/2013   Hyperlipidemia 05/09/2013   PVC's (premature ventricular contractions) 05/09/2013   Chronic combined systolic and diastolic heart failure (HCC) 05/09/2013   PCP:  Renford Dills, MD Pharmacy:   [facility] [phone] - [place], [place] - [address] AT [facility] OF [address] [place] [address] Phone: [phone] Fax: [phone]  [facility] [address] [place] [address] Phone: [phone] Fax: [phone]  Social Drivers of Health (SDOH) Social History: SDOH Screenings   Food Insecurity: No Food Insecurity (06/28/2023)  Housing: Low Risk  (06/28/2023)  Transportation Needs: No Transportation Needs (06/28/2023)  Utilities: Not At Risk (06/28/2023)  Alcohol Screen: Low Risk  (03/10/2023)  Depression (PHQ2-9): Low Risk  (05/13/2021)  Financial Resource Strain: Low Risk  (03/10/2023)  Social Connections: Moderately Integrated (06/28/2023)  Tobacco Use: Medium Risk (06/28/2023)    Readmission Risk Interventions    06/29/2023    3:23 PM  Readmission Risk Prevention Plan  Transportation Screening Complete  HRI or Home Care Consult  Complete  Social Work Consult for Recovery Care Planning/Counseling Complete  Palliative Care Screening Not Applicable  Medication Review Oceanographer) Referral to Pharmacy

## 2023-06-29 NOTE — Progress Notes (Addendum)
 [facility] Progress Note   Subjective:   More awake this AM.  Says no complaints and breathing is improved.  Denies N/V.  Says had good dinner but cannot say what.   Oriented to self, 2025 but not location.  No family present. I/Os yest / 200 mL  Objective Vitals:   06/29/23 0123 06/29/23 0310 06/29/23 0430 06/29/23 0748  BP:  (!) 117/97  (!) 119/99  Pulse:  (!) 127  (!) 118  Resp: (!) 25 20  20   Temp: (!) 97.4 F (36.3 C) (!) 96.4 F (35.8 C)  (!) 97.3 F (36.3 C)  TempSrc: Oral Axillary  Axillary  SpO2:    97%  Weight:   96.3 kg   Height:       Physical Exam General: comfortable in bed Heart: A fib with RVR in 120s on monitor Lungs: clear on RA 97% Abdomen:soft Extremities: trace ankle edema Neuro:  AOx2 - self, 2025, not place or situation it appears but he's also very hard of hearing  Additional Objective Labs: Basic Metabolic Panel: Recent Labs  Lab 06/28/23 1034 06/28/23 1228 06/29/23 0548  NA 138  --  138  K 5.2*  --  5.3*  CL 109  --  107  CO2 13*  --  15*  GLUCOSE 127*  --  119*  BUN 109*  --  123*  CREATININE 3.80*  --  4.26*  CALCIUM 9.1  --  8.6*  PHOS  --  7.5*  --    Liver Function Tests: No results for input(s): "AST", "ALT", "ALKPHOS", "BILITOT", "PROT", "ALBUMIN" in the last 168 hours. No results for input(s): "LIPASE", "AMYLASE" in the last 168 hours. CBC: Recent Labs  Lab 06/28/23 1034  WBC 8.3  HGB 10.7*  HCT 33.4*  MCV 94.4  PLT PLATELET CLUMPS NOTED ON SMEAR, UNABLE TO ESTIMATE   Blood Culture    Component Value Date/Time   SDES URINE, CLEAN CATCH 03/16/2021 2051   SPECREQUEST  03/16/2021 2051    NONE Performed at [facility], [address], [place], [place] [address]    CULT >=100,000 COLONIES/mL KLEBSIELLA PNEUMONIAE (A) 03/16/2021 2051   REPTSTATUS 03/18/2021 FINAL 03/16/2021 2051    Cardiac Enzymes: No results for input(s): "CKTOTAL", "CKMB", "CKMBINDEX", "TROPONINI" in the last 168  hours. CBG: Recent Labs  Lab 06/28/23 2241 06/29/23 0750  GLUCAP 141* 118*   Iron Studies: No results for input(s): "IRON", "TIBC", "TRANSFERRIN", "FERRITIN" in the last 72 hours. @lablastinr3 @ Studies/Results: US RENAL Result Date: 06/28/2023 CLINICAL DATA:  Acute kidney injury EXAM: RENAL / URINARY TRACT ULTRASOUND COMPLETE COMPARISON:  CT 12/10/2017 FINDINGS: Right Kidney: Renal measurements: 9.5 x 6.1 x 5.3 cm = volume: 161.5 mL. No collecting system dilatation perinephric fluid. Multiple simple cysts are identified as seen on previous examination. These includes central focus measuring 2.7 x 2.9 x 2.7 cm and lower pole focus measuring 3.9 x 3.0 x 3.9 cm. Left Kidney: Renal measurements: 10.0 x 6.3 x 5.3 cm = volume: 176.6 mL. No collecting system dilatation perinephric fluid. Simple appearing cysts are seen including measuring 2.6 x 2.8 x 2.7 cm Bladder: Underdistended. Other: Prostate poorly defined but felt to be enlarged. Tiny left pleural effusion IMPRESSION: No collecting system dilatation. Bilateral renal cysts are identified. These were seen on previous CT. Enlarged prostate but poorly defined on this examination. Tiny left pleural effusion Electronically Signed   By: Karen Kays M.D.   On: 06/28/2023 15:53   DG Chest 2  View Result Date: 06/28/2023 CLINICAL DATA:  Chest pain. EXAM: CHEST - 2 VIEW COMPARISON:  Chest radiograph dated 03/10/2023. FINDINGS: Shallow inspiration with left lung base atelectasis. No pleural effusion or pneumothorax. Mild cardiomegaly. Aortic valve repair. Atherosclerotic calcification of the aorta. No acute osseous pathology. IMPRESSION: 1. Shallow inspiration with left lung base atelectasis. 2. Mild cardiomegaly. Electronically Signed   By: Elgie Collard M.D.   On: 06/28/2023 11:54   Medications:   apixaban  2.5 mg Oral BID   ezetimibe  10 mg Oral Daily   metoprolol tartrate  12.5 mg Oral BID   rosuvastatin  40 mg Oral QPM   sodium zirconium  cyclosilicate  10 g Oral Once   Assessment/PlanRonald Horn is an 89 y.o. male with HFrEF, HTN, HL, DM, CAD, PAD s/p R partial foot amputation, h/o severe AS s/p TAVR 2018, A fib on eliquis currently being admitted with weakness and dyspnea and nephrology is consulted for evaluation and management of AKI on CKD.    **AKI on CKD3b: baseline kidney function with Cr in the 1.6-1.8mg /dL dating back at least a few years, presented with BUN 109, Cr 3.8.  By history and exam he was hypovolemic on presention, which is supported by low FeNa. UA +blood but none on microscopy, appearing like UTI, culture pending.  Renal US no obstruction.    Holding SGLT2i for now (other meds inc MRB, entresto, lasix were on hold at last d/c already).   Labs slightly worse today - possibly progressed to mild ATN in the setting of hypovolemia.  No indications for RRT today.  Cont to follow daily labs, strict I/Os, daily weights.  Avoid nephrotoxins and hypotension - cont midodrine as needed for BP support.      **Hyperkalemia: mild, lokelma today  **suspected UTI:  UA many WBC and bacteria; ordered culture and ceftriaxone.   **AGMA: in the setting of severe AKI.  VBG confirms metabolic with resp compensation, lactate was 2 overnight, to be rechecked today. Cont bicarb gtt.    **Chronic biV HFrEF:  05/2023 TTE LVEF 25%, RV decreased. Appears compensated currently.  Meds limited by GFR and BPs.  Holding diuretics in setting of hypovolemia; on isotonic fluids currently.   **Dyspnea:  CXR fairly clear, suspect compensatory hyperventilation due to met acidosis which is being treated.   **A fib with RVR:  cardiology following - tachycardia thought to be compensatory for hypovolemia though continues this AM, remains on IVF.  On eliquis and BB.    **Normocytic anemia:  Hb in the 10s and stable c/w recent admission.  Follow.    **recent CVA: thought to be embolic in setting of new A fib, tx with thrombolytics earlier this month.    Will follow, reach out with concerns.   Addendum:  this afternoon more agitated. UOP only through the shift.   I spoke to his wife at the bedside and let her know he may be developing uremic symptoms though I think he's also having delirium as it's waxing and waning.  I suspect kidney function will continue to worsen and in anticipation of this and need for RRT tomorrow will make him NPOpMN and request placement of temp HD catheter by IR tomorrow.  Wife expresses desire to pursue dialysis if necessary.  Pt not currently able to have the discussion.   Estill Bakes MD 06/29/2023, 8:09 AM  [facility] Pager: [phone]

## 2023-06-29 NOTE — Evaluation (Signed)
 Occupational Therapy Evaluation Patient Details Name: Robert Horn MRN: [id number] DOB: [birth year] Today's Date: 06/29/2023   History of Present Illness   89 y.o. male presented to ED 3/31 with shortness of breath and generalized weakness.  Recently hospitalized 3/19-3/24 for cardioembolic stroke from atrial fibrillation.  Pt noted to have worsening renal function and was admitted for further work up.  AOZ:HYQMVHQ combined systolic and diastolic heart failure, atrial fibrillation on Eliquis, severe AS s/p TAVR (2018), PAD s/p right transmetatarsal amputation, hx of colon cancer s/p colectomy, CKD stage IIIb, type 2 diabetes with neuropathy, hypertension, hyperlipidemia, anemia of chronic disease, recent history of CVA     Clinical Impressions Pt presents with decline in function and safety with ADLs and ADL mobility with impaired strength, balance and endurance; pt limited by back pain and restlessness. PTA pt lives at home with his wife. Pt recently returned home from hospital with his wife and had increased difficulty with ADLs and mobility due to increased work of breathing, and per wife had been spending the majority of the day in bed. Pt currently requires min A for LB ADLs, deferred mobility for toilet transfers and toileting due to pain and fatigue. Pt would benefit from acute OT services to address impairments to maximize level of function and safety   If plan is discharge home, recommend the following:   A little help with bathing/dressing/bathroom;A little help with walking and/or transfers;Direct supervision/assist for medications management;Assist for transportation;Help with stairs or ramp for entrance     Functional Status Assessment   Patient has had a recent decline in their functional status and demonstrates the ability to make significant improvements in function in a reasonable and predictable amount of time.     Equipment Recommendations   None recommended by OT      Recommendations for Other Services         Precautions/Restrictions   Precautions Precautions: Fall Recall of Precautions/Restrictions: Intact Precaution/Restrictions Comments: Watch HR Restrictions Weight Bearing Restrictions Per Provider Order: No     Mobility Bed Mobility Overal bed mobility: Modified Independent Bed Mobility: Supine to Sit, Sit to Supine     Supine to sit: Modified independent (Device/Increase time) Sit to supine: Modified independent (Device/Increase time)   General bed mobility comments: limited by back pain    Transfers                   General transfer comment: pt declined due to fatigue and back pain      Balance Overall balance assessment: Needs assistance Sitting-balance support: No upper extremity supported, Feet supported Sitting balance-Leahy Scale: Good                                     ADL either performed or assessed with clinical judgement   ADL Overall ADL's : Needs assistance/impaired     Grooming: Wash/dry face;Wash/dry hands;Contact guard assist;Sitting   Upper Body Bathing: Contact guard assist;Sitting Upper Body Bathing Details (indicate cue type and reason): simulated Lower Body Bathing: Minimal assistance   Upper Body Dressing : Contact guard assist;Sitting   Lower Body Dressing: Minimal assistance     Toilet Transfer Details (indicate cue type and reason): declined, min A SPTs per PT note         Functional mobility during ADLs: Minimal assistance General ADL Comments: ADL participation impacted by back pain, generalized discomfort, activity tolerance     Vision  Baseline Vision/History: 1 Wears glasses Ability to See in Adequate Light: 0 Adequate Patient Visual Report: No change from baseline       Perception         Praxis         Pertinent Vitals/Pain Pain Assessment Pain Assessment: Faces Faces Pain Scale: Hurts even more Pain Location: back Pain Descriptors  / Indicators: Sore, Aching Pain Intervention(s): Monitored during session, Limited activity within patient's tolerance, Repositioned, Premedicated before session     Extremity/Trunk Assessment Upper Extremity Assessment Upper Extremity Assessment: Generalized weakness   Lower Extremity Assessment Lower Extremity Assessment: Defer to PT evaluation RLE Deficits / Details: R transmet amputation, reports has orthopedic shoe; otherwise WFL   Cervical / Trunk Assessment Cervical / Trunk Assessment: Normal   Communication Communication Communication: Impaired Factors Affecting Communication: Hearing impaired   Cognition Arousal: Alert Behavior During Therapy: WFL for tasks assessed/performed, Restless                                 Following commands: Impaired Following commands impaired: Follows one step commands with increased time, Follows multi-step commands with increased time     Cueing  General Comments   Cueing Techniques: Verbal cues;Tactile cues;Gestural cues;Visual cues  HR in 150s with transfer, SpO2 >90%O2 on RA despite increased work of breathing and preference for tripod positioning in seated   Exercises     Shoulder Instructions      Home Living Family/patient expects to be discharged to:: Private residence Living Arrangements: Spouse/significant other Available Help at Discharge: Family;Available 24 hours/day Type of Home: House Home Access: Stairs to enter [employer] of Steps: front: 3 large steps - deep.  Back: 5 steps to the deck Entrance Stairs-Rails: None Home Layout: Multi-level;Able to live on main level with bedroom/bathroom Alternate Level Stairs-Number of Steps: Enter to main level from the front entrance.  12 steps to upstairs bedrooms. Alternate Level Stairs-Rails: Right Bathroom Shower/Tub: Producer, television/film/video: Standard Bathroom Accessibility: Yes   Home Equipment: Agricultural consultant (2 wheels);Cane -  single point;BSC/3in1;Shower seat;Electric scooter      Lives With: Spouse    Prior Functioning/Environment Prior Level of Function : Independent/Modified Independent;Working/employed;Driving             Mobility Comments: No AD ADLs Comments: Ind with ADLs/selfcare    OT Problem List: Decreased strength;Impaired balance (sitting and/or standing);Pain;Decreased activity tolerance   OT Treatment/Interventions: Self-care/ADL training;Therapeutic activities;Patient/family education;Balance training;DME and/or AE instruction      OT Goals(Current goals can be found in the care plan section)   Acute Rehab OT Goals Patient Stated Goal: get better, go home OT Goal Formulation: With patient/family Time For Goal Achievement: 07/13/23 Potential to Achieve Goals: Good ADL Goals Pt Will Perform Grooming: with contact guard assist;with supervision;standing;with caregiver independent in assisting Pt Will Perform Upper Body Bathing: with supervision;with set-up;sitting;with caregiver independent in assisting Pt Will Perform Lower Body Bathing: with contact guard assist;with caregiver independent in assisting Pt Will Perform Upper Body Dressing: with supervision;with set-up;sitting Pt Will Perform Lower Body Dressing: with contact guard assist;with caregiver independent in assisting Pt Will Transfer to Toilet: with min assist;with contact guard assist;stand pivot transfer;ambulating Pt Will Perform Toileting - Clothing Manipulation and hygiene: with min assist;with contact guard assist;sit to/from stand;with caregiver independent in assisting   OT Frequency:  Min 2X/week    Co-evaluation  AM-PAC OT "6 Clicks" Daily Activity     Outcome Measure Help from another person eating meals?: None Help from another person taking care of personal grooming?: A Little Help from another person toileting, which includes using toliet, bedpan, or urinal?: A Lot Help from another  person bathing (including washing, rinsing, drying)?: A Lot Help from another person to put on and taking off regular upper body clothing?: A Little Help from another person to put on and taking off regular lower body clothing?: A Lot 6 Click Score: 16   End of Session    Activity Tolerance: Patient limited by fatigue;Patient limited by pain Patient left: in bed;with call bell/phone within reach;with bed alarm set;with family/visitor present  OT Visit Diagnosis: Other abnormalities of gait and mobility (R26.89);Muscle weakness (generalized) (M62.81);Pain Pain - part of body:  (back)                Time: 4098-1191 OT Time Calculation (min): 17 min Charges:  OT General Charges $OT Visit: 1 Visit OT Evaluation $OT Eval Low Complexity: 1 Low   Galen Manila 06/29/2023, 2:17 PM

## 2023-06-29 NOTE — Progress Notes (Signed)
 Patient Name: Robert Horn Date of Encounter: 06/29/2023 [facility] Cardiologist: Chilton Si, MD   Interval Summary  .    Agitated and seems a bit delirious.  Wife in the room feels he is quite uncomfortable and notes that his back has been hurting.  Waxing and waning mentation, when seen earlier today not felt to be uremic, sats have been appropriate otherwise but currently 87% with agitation.  Lactate elevated, urine output poor.  Vital Signs .    Vitals:   06/29/23 1221 06/29/23 1225 06/29/23 1235 06/29/23 1300  BP:  (!) 117/96    Pulse:  (!) 122 75 100  Resp: 20     Temp:  (!) 96 F (35.6 C)    TempSrc:  Axillary    SpO2:  97% 100% 100%  Weight:      Height:        Intake/Output Summary (Last 24 hours) at 06/29/2023 1516 Last data filed at 06/29/2023 1341 Gross per 24 hour  Intake 1138.25 ml  Output 300 ml  Net 838.25 ml      06/29/2023    4:30 AM 06/28/2023    9:29 PM 06/28/2023   10:23 AM  Last 3 Weights  Weight (lbs) 212 lb 4.9 oz 207 lb 14.3 oz 209 lb  Weight (kg) 96.3 kg 94.3 kg 94.802 kg      Telemetry/ECG    Afib rates 110-120 - Personally Reviewed  Physical Exam .   GEN: agitated Neck: No JVD Cardiac: iRRR, no murmurs Respiratory: Clear to auscultation bilaterally. GI: Soft, nontender, non-distended  MS: No edema  Assessment & Plan .     Chronic biventricular heart failure  Nonischemic cardiomyopathy RHC/LHC from 02/2023 showed: no angiographic evidence of CAD Echo from 05/2023 showed: EF 25-30%, global hypokinesis, reduced RV function, biatrial enlargement, small pericardial effusion Not currently appearing volume overloaded -- considering labs and history likely very dry inside and needs fluids  He was laying flat in the bed, with no difficulty breathing, currently on room air -- states he is feeling good  BNP 2,409, elevated but unreliable in the setting of acute renal failure  Continue strict I&O's and daily weights, daily BMPs  - UOP suboptimal. Lactate elevated. Will have low threshold to call AHF team. Patient is going to undergo volume challenge per nephrology  Dyspnea felt to be compensatory for metabolic acidosis.  Increase metoprolol tartrate to 25 mg BID, rates not well controlled, patient somewhat agitated today.    Dyspnea Not currently requiring any oxygen, SpO2 96-100% on room air, however when I was in room it dropped to 87% with agitation.  Waxing and waning dyspnea.  Further workup and treatment per primary    Atrial fibrillation, recently diagnosed 05/2023 Anticoagulated on low-dose Eliquis  CHA2DS2-VASc score is 8  Found to be in A. Fib with RVR during last admission to the hospital 05/2023 Currently on Eliquis 2.5 mg BID -- appropriate dose given age and renal function  Metop as above, rate control worse today but agitated.   Elevated troponin 205 > 215 Likely in the setting of demand ischemia secondary to tachyarrhythmia  Patient denies any current or prior chest pain  EKG showed no acute ischemic changes    Hypertension Most recent BP 115/95 with HR 109 Patient had been holding most of his GDMT since being discharged from the hospital 3/24 but had been taking Toprol 25 mg daily -- possibly taking 50 mg daily the last couple of days as instructed  by PCP  At one point was started on midodrine for hypotension so will be cautious with re-starting beta blocker  Patient has been given IV metoprolol 5 mg x 2 doses in the ED Metop increase to 25 mg BID   Hyperlipidemia  06/16/2023: ALT 30 06/17/2023: HDL 39; LDL Cholesterol 66  Continue Zetia 10 mg daily  Continue Crestor 40 mg daily    PAD s/p partial right foot amputations 01/2023 Severe AS s/p 26mm Edwards Sapien TAVR 11/2016  Echo 05/2023 showed: mild MR, mild to moderate TR, no perivalvular leak, no AR No current symptoms, continue to follow    Per primary AKI on CKD stage 3b Electrolyte disturbances  History of stroke, 05/2023 History  of TIA Type 2 diabetes History of colon cancer s/p colectomy   For questions or updates, please contact [facility] Please consult [email or web address] for contact info under        Signed, Parke Poisson, MD

## 2023-06-29 NOTE — Progress Notes (Signed)
 [facility] PROGRESS NOTE   Robert Horn ZOX:[id number] DOB: 10-19-1933 DOA: 06/28/2023  PCP: Renford Dills, MD  Brief History: 89 y.o. male with medical history significant of chronic combined systolic and diastolic heart failure, atrial fibrillation on Eliquis, severe AS s/p TAVR (2018), PAD s/p right transmetatarsal amputation, hx of colon cancer s/p colectomy, CKD stage IIIb, type 2 diabetes with neuropathy, hypertension, hyperlipidemia, anemia of chronic disease, recent history of CVA presented with shortness of breath and generalized weakness.  Recently hospitalized for cardioembolic stroke from atrial fibrillation.  Noted to have worsening renal function.  He was hospitalized for further management.      Consultants: Nephrology  Procedures: None    Subjective/Interval History: Patient is distracted and confused.  Awake.  Follows commands.  Denies any chest pain.    Assessment/Plan:  Atrial fibrillation with RVR/elevated troponin Recently diagnosed with atrial fibrillation during recent hospitalization for stroke. Toprol dose was recently increased as well. Came in with RVR.  Cardiology was consulted. Patient noted to be just on metoprolol this morning.  Also on Eliquis. Heart rate to be monitored closely on telemetry. TSH is normal at 1.02. Elevated troponin possibly due to demand ischemia.  Acute kidney injury on chronic kidney disease stage IIIb/metabolic acidosis/hyperkalemia Baseline creatinine is around 1.6-1.8.  Came in with creatinine of 3.8.  Noted to be 4.2 this morning.  Renal ultrasound did not show any hydronephrosis. Nephrology is following.  Patient is on a bicarbonate infusion. ATN is suspected. Continue to monitor urine output. Lokelma for elevated potassium level  Chronic biventricular CHF/nonischemic cardiomyopathy Echocardiogram earlier this month showed LVEF of 25 to 30% with global hypokinesis. Cardiology is following.  Cannot use ACE  inhibitor or ARB due to his renal dysfunction. Noted to be on metoprolol.  Abnormal UA/acute UTI Patient unable to say if he is having symptoms.  He is confused which could be from infection. Started on ceftriaxone.  Follow-up on urine cultures.  History of essential hypertension It looks like he has had hypotension as well.  He was started on midodrine for low blood pressure.  Not noted to be on midodrine currently.  Blood pressure is stable.  Seems to be tolerating metoprolol.  Recent CVA, cardioembolic Continue Eliquis statin. Received TNK during previous hospitalization.  Anemia of chronic disease No evidence of overt bleeding.  Monitor hemoglobin.  Diabetes mellitus type 2 with peripheral neuropathy HbA1c was 6.7 recently.  Home medications currently on hold.  SSI.  Acute metabolic encephalopathy Baseline mentation is not clear.  No encephalopathy was mentioned during previous hospitalization. This could be secondary to UTI and other acute medical issues.  Continue to monitor.  No focal neurological deficits noted.  PT and OT evaluation.    DVT Prophylaxis: Anticoagulated with Eliquis Code Status: Full code Family Communication: No family at bedside Disposition Plan: To be determined  Status is: Observation The patient will require care spanning > 2 midnights and should be moved to inpatient because: Acute kidney injury      Medications: Scheduled:  apixaban  2.5 mg Oral BID   ezetimibe  10 mg Oral Daily   metoprolol tartrate  12.5 mg Oral BID   rosuvastatin  40 mg Oral QPM   Continuous:  cefTRIAXone (ROCEPHIN)  IV     sodium bicarbonate 150 mEq in dextrose 5 % 1,150 mL infusion     WJX:[id number] **OR** acetaminophen  Antibiotics: Anti-infectives (From admission, onward)    Start     Dose/Rate Route Frequency Ordered Stop  06/29/23 0900  cefTRIAXone (ROCEPHIN) 1 g in sodium chloride 0.9 % 100 mL IVPB        1 g 200 mL/hr over 30 Minutes Intravenous  Every 24 hours 06/29/23 0812 07/06/23 0859       Objective:  Vital Signs  Vitals:   06/29/23 0123 06/29/23 0310 06/29/23 0430 06/29/23 0748  BP:  (!) 117/97  (!) 119/99  Pulse:  (!) 127  (!) 118  Resp: (!) 25 20  20   Temp: (!) 97.4 F (36.3 C) (!) 96.4 F (35.8 C)  (!) 97.3 F (36.3 C)  TempSrc: Oral Axillary  Axillary  SpO2:    97%  Weight:   96.3 kg   Height:        Intake/Output Summary (Last 24 hours) at 06/29/2023 1050 Last data filed at 06/29/2023 0831 Gross per 24 hour  Intake 909.56 ml  Output 225 ml  Net 684.56 ml   Filed Weights   06/28/23 1023 06/28/23 2129 06/29/23 0430  Weight: 94.8 kg 94.3 kg 96.3 kg    General appearance: Awake alert.  In no distress.  Confused Resp: Clear to auscultation bilaterally.  Normal effort Cardio: S1-S2 is irregularly irregular GI: Abdomen is soft.  Nontender nondistended.  Bowel sounds are present normal.  No masses organomegaly Extremities: No edema.  Full range of motion of lower extremities. Neurologic: No focal neurological deficits noted   Lab Results:  Data Reviewed: I have personally reviewed following labs and reports of the imaging studies  CBC: Recent Labs  Lab 06/28/23 1034 06/29/23 0548  WBC 8.3 8.0  HGB 10.7* 10.5*  HCT 33.4* 32.8*  MCV 94.4 92.7  PLT PLATELET CLUMPS NOTED ON SMEAR, UNABLE TO ESTIMATE PLATELET CLUMPS NOTED ON SMEAR, UNABLE TO ESTIMATE    Basic Metabolic Panel: Recent Labs  Lab 06/28/23 1034 06/28/23 1228 06/29/23 0548  NA 138  --  138  K 5.2*  --  5.3*  CL 109  --  107  CO2 13*  --  15*  GLUCOSE 127*  --  119*  BUN 109*  --  123*  CREATININE 3.80*  --  4.26*  CALCIUM 9.1  --  8.6*  MG  --  3.5*  --   PHOS  --  7.5*  --     GFR: Estimated Creatinine Clearance: 14.7 mL/min (A) (by C-G formula based on SCr of 4.26 mg/dL (H)).   CBG: Recent Labs  Lab 06/28/23 2241 06/29/23 0750  GLUCAP 141* 118*    Thyroid Function Tests: Recent Labs    06/28/23 1417  TSH  1.024    Radiology Studies: US RENAL Result Date: 06/28/2023 CLINICAL DATA:  Acute kidney injury EXAM: RENAL / URINARY TRACT ULTRASOUND COMPLETE COMPARISON:  CT 12/10/2017 FINDINGS: Right Kidney: Renal measurements: 9.5 x 6.1 x 5.3 cm = volume: 161.5 mL. No collecting system dilatation perinephric fluid. Multiple simple cysts are identified as seen on previous examination. These includes central focus measuring 2.7 x 2.9 x 2.7 cm and lower pole focus measuring 3.9 x 3.0 x 3.9 cm. Left Kidney: Renal measurements: 10.0 x 6.3 x 5.3 cm = volume: 176.6 mL. No collecting system dilatation perinephric fluid. Simple appearing cysts are seen including measuring 2.6 x 2.8 x 2.7 cm Bladder: Underdistended. Other: Prostate poorly defined but felt to be enlarged. Tiny left pleural effusion IMPRESSION: No collecting system dilatation. Bilateral renal cysts are identified. These were seen on previous CT. Enlarged prostate but poorly defined on this examination. Tiny left pleural effusion  Electronically Signed   By: Karen Kays M.D.   On: 06/28/2023 15:53   DG Chest 2 View Result Date: 06/28/2023 CLINICAL DATA:  Chest pain. EXAM: CHEST - 2 VIEW COMPARISON:  Chest radiograph dated 03/10/2023. FINDINGS: Shallow inspiration with left lung base atelectasis. No pleural effusion or pneumothorax. Mild cardiomegaly. Aortic valve repair. Atherosclerotic calcification of the aorta. No acute osseous pathology. IMPRESSION: 1. Shallow inspiration with left lung base atelectasis. 2. Mild cardiomegaly. Electronically Signed   By: Elgie Collard M.D.   On: 06/28/2023 11:54       LOS: 0 days   Robert Horn Rito Ehrlich  [facility] Pager on [email or web address]  06/29/2023, 10:50 AM

## 2023-06-29 NOTE — Evaluation (Signed)
 Physical Therapy Evaluation Patient Details Name: Robert Horn MRN: [id number] DOB: Apr 18, 1933 Today's Date: 06/29/2023  History of Present Illness  89 y.o. male presented to ED 3/31 with shortness of breath and generalized weakness.  Recently hospitalized 3/19-3/24 for cardioembolic stroke from atrial fibrillation.  Pt noted to have worsening renal function and was admitted for further work up.  WUJ:WJXBJYN combined systolic and diastolic heart failure, atrial fibrillation on Eliquis, severe AS s/p TAVR (2018), PAD s/p right transmetatarsal amputation, hx of colon cancer s/p colectomy, CKD stage IIIb, type 2 diabetes with neuropathy, hypertension, hyperlipidemia, anemia of chronic disease, recent history of CVA  Clinical Impression  Pt returned home from hospital with his wife and had increased difficulty with mobility due to increased work of breathing, and per wife had been spending the majority of the day in bed. Pt is currently limited in safe mobility by generalized weakness and decreased mobility tolerance due to increased work of breathing and increased HR. Pt is currently supervision for bed mobility and min A for transfers. Ambulation deferred due to HR in 150s and increased WoB. PT recommending HHPT at discharge. PT will continue to follow acutely.       If plan is discharge home, recommend the following: Assistance with cooking/housework;Supervision due to cognitive status;Help with stairs or ramp for entrance;Assist for transportation;A little help with bathing/dressing/bathroom;A little help with walking and/or transfers   Can travel by private vehicle    Yes          Functional Status Assessment Patient has had a recent decline in their functional status and demonstrates the ability to make significant improvements in function in a reasonable and predictable amount of time.     Precautions / Restrictions Precautions Precautions: Fall Recall of Precautions/Restrictions:  Intact Precaution/Restrictions Comments: Watch HR Restrictions Weight Bearing Restrictions Per Provider Order: No      Mobility  Bed Mobility Overal bed mobility: Modified Independent Bed Mobility: Supine to Sit     Supine to sit: Modified independent (Device/Increase time)     General bed mobility comments: assist for lines    Transfers Overall transfer level: Needs assistance Equipment used: None Transfers: Sit to/from Stand, Bed to chair/wheelchair/BSC Sit to Stand: Supervision   Step pivot transfers: Min assist       General transfer comment: supervision for safety to come to standing, minA HHA needed for support with stepping to recliner    Ambulation/Gait               General Gait Details: deferred due to increased work of breathing and HR max noted 150s bpm with transfer      Balance Overall balance assessment: Needs assistance Sitting-balance support: No upper extremity supported, Feet supported Sitting balance-Leahy Scale: Good     Standing balance support: No upper extremity supported, During functional activity, Bilateral upper extremity supported Standing balance-Leahy Scale: Poor Standing balance comment: requires bilateral UE support for dynamic balance                             Pertinent Vitals/Pain Pain Assessment Pain Assessment: Faces Faces Pain Scale: Hurts little more Pain Location: "bottom"from being in the bed Pain Descriptors / Indicators: Sore Pain Intervention(s): Repositioned, Other (comment) (geomat ordered for improved weight shifting in sitting)    Home Living Family/patient expects to be discharged to:: Private residence Living Arrangements: Spouse/significant other Available Help at Discharge: Family;Available 24 hours/day Type of Home: House Home Access: Stairs  to enter Entrance Stairs-Rails: None Entrance Stairs-Number of Steps: front: 3 large steps - deep.  Back: 5 steps to the deck Alternate Level  Stairs-Number of Steps: Enter to main level from the front entrance.  12 steps to upstairs bedrooms. Home Layout: Multi-level;Able to live on main level with bedroom/bathroom Home Equipment: Rolling Walker (2 wheels);Cane - single point;BSC/3in1;Shower seat;Electric scooter      Prior Function Prior Level of Function : Independent/Modified Independent;Working/employed;Driving             Mobility Comments: No AD ADLs Comments: Ind     Extremity/Trunk Assessment   Upper Extremity Assessment Upper Extremity Assessment: Defer to OT evaluation    Lower Extremity Assessment Lower Extremity Assessment: RLE deficits/detail RLE Deficits / Details: R transmet amputation, reports has orthopedic shoe; otherwise WFL    Cervical / Trunk Assessment Cervical / Trunk Assessment: Normal  Communication   Communication Communication: Impaired Factors Affecting Communication: Hearing impaired    Cognition Arousal: Alert Behavior During Therapy: WFL for tasks assessed/performed   PT - Cognitive impairments: Awareness, Safety/Judgement, Attention, Orientation Difficult to assess due to: Hard of hearing/deaf Orientation impairments: Time, Situation                   PT - Cognition Comments: oriented to self, able to state he is at [facility] but then makes reference to courthouse, pt retired judge, Following commands: Impaired Following commands impaired: Follows one step commands with increased time, Follows multi-step commands with increased time     Cueing Cueing Techniques: Verbal cues, Tactile cues, Gestural cues, Visual cues     General Comments General comments (skin integrity, edema, etc.): HR in 150s with transfer, SpO2 >90%O2 on RA despite increased work of breathing and preference for tripod positioning in seated        Assessment/Plan    PT Assessment Patient needs continued PT services  PT Problem List Decreased mobility;Decreased safety awareness;Decreased  activity tolerance;Cardiopulmonary status limiting activity;Decreased strength       PT Treatment Interventions Gait training;Stair training;Balance training;Therapeutic activities;Patient/family education    PT Goals (Current goals can be found in the Care Plan section)  Acute Rehab PT Goals PT Goal Formulation: With patient Time For Goal Achievement: 07/13/23 Potential to Achieve Goals: Good    Frequency Min 3X/week        AM-PAC PT "6 Clicks" Mobility  Outcome Measure Help needed turning from your back to your side while in a flat bed without using bedrails?: None Help needed moving from lying on your back to sitting on the side of a flat bed without using bedrails?: None Help needed moving to and from a bed to a chair (including a wheelchair)?: A Little Help needed standing up from a chair using your arms (e.g., wheelchair or bedside chair)?: A Little Help needed to walk in hospital room?: A Lot Help needed climbing 3-5 steps with a railing? : A Lot 6 Click Score: 18    End of Session Equipment Utilized During Treatment: Gait belt Activity Tolerance: Patient tolerated treatment well Patient left: with call bell/phone within reach;with family/visitor present;in chair;with chair alarm set Nurse Communication: Mobility status PT Visit Diagnosis: Unsteadiness on feet (R26.81);Muscle weakness (generalized) (M62.81);Difficulty in walking, not elsewhere classified (R26.2)    Time: 1027-2536 PT Time Calculation (min) (ACUTE ONLY): 20 min   Charges:   PT Evaluation $PT Eval Low Complexity: 1 Low   PT General Charges $$ ACUTE PT VISIT: 1 Visit  Camika Marsico B. Beverely Risen PT, DPT Acute Rehabilitation Services Please use secure chat or  Call Office [phone]   Elon Alas [facility] 06/29/2023, 12:57 PM

## 2023-06-29 NOTE — Telephone Encounter (Signed)
 2nd call attempt, patient wife states that they had to take him back to the hospital. She states she will call back once he is discharged to go over any medication changes. Advised hospitalitis will consult cardiology if needed.

## 2023-06-29 NOTE — Progress Notes (Signed)
 History of atrial fibrillation Patient is refusing oral metoprolol and Eliquis tonight.  Currently on oral Lopressor 25 mg twice daily and as needed Lopressor 2.5 mg every 6 hour as needed. -As patient is not taking oral Lopressor changing it to IV Lopressor 5 mg every 6 hours scheduled.  Tereasa Coop, MD [facility] 06/29/2023, 11:30 PM

## 2023-06-29 DEATH — deceased

## 2023-06-30 ENCOUNTER — Ambulatory Visit (HOSPITAL_COMMUNITY)

## 2023-06-30 ENCOUNTER — Inpatient Hospital Stay (HOSPITAL_COMMUNITY)

## 2023-06-30 DIAGNOSIS — I4891 Unspecified atrial fibrillation: Secondary | ICD-10-CM | POA: Diagnosis not present

## 2023-06-30 DIAGNOSIS — I5042 Chronic combined systolic (congestive) and diastolic (congestive) heart failure: Secondary | ICD-10-CM | POA: Diagnosis not present

## 2023-06-30 DIAGNOSIS — N179 Acute kidney failure, unspecified: Secondary | ICD-10-CM | POA: Diagnosis not present

## 2023-06-30 DIAGNOSIS — D638 Anemia in other chronic diseases classified elsewhere: Secondary | ICD-10-CM | POA: Diagnosis not present

## 2023-06-30 HISTORY — PX: IR US GUIDE VASC ACCESS RIGHT: IMG2390

## 2023-06-30 HISTORY — PX: IR FLUORO GUIDE CV LINE RIGHT: IMG2283

## 2023-06-30 LAB — COOXEMETRY PANEL
Carboxyhemoglobin: 0.7 % (ref 0.5–1.5)
Methemoglobin: <0.7 % (ref 0.0–1.5)
O2 Saturation: 36.5 %
Total hemoglobin: 9.9 g/dL — ABNORMAL LOW (ref 12.0–16.0)

## 2023-06-30 LAB — LACTIC ACID, PLASMA: Lactic Acid, Venous: 5.3 mmol/L (ref 0.5–1.9)

## 2023-06-30 LAB — BASIC METABOLIC PANEL WITH GFR
Anion gap: 17 — ABNORMAL HIGH (ref 5–15)
BUN: 135 mg/dL — ABNORMAL HIGH (ref 8–23)
CO2: 15 mmol/L — ABNORMAL LOW (ref 22–32)
Calcium: 7.5 mg/dL — ABNORMAL LOW (ref 8.9–10.3)
Chloride: 105 mmol/L (ref 98–111)
Creatinine, Ser: 5.25 mg/dL — ABNORMAL HIGH (ref 0.61–1.24)
GFR, Estimated: 10 mL/min — ABNORMAL LOW (ref >60)
Glucose, Bld: 137 mg/dL — ABNORMAL HIGH (ref 70–99)
Potassium: 5.1 mmol/L (ref 3.5–5.1)
Sodium: 137 mmol/L (ref 135–145)

## 2023-06-30 LAB — HEPATITIS B SURFACE ANTIGEN: Hepatitis B Surface Ag: NONREACTIVE

## 2023-06-30 LAB — CBC
HCT: 32.2 % — ABNORMAL LOW (ref 39.0–52.0)
Hemoglobin: 10.5 g/dL — ABNORMAL LOW (ref 13.0–17.0)
MCH: 30 pg (ref 26.0–34.0)
MCHC: 32.6 g/dL (ref 30.0–36.0)
MCV: 92 fL (ref 80.0–100.0)
Platelets: UNDETERMINED 10*3/uL (ref 150–400)
RBC: 3.5 MIL/uL — ABNORMAL LOW (ref 4.22–5.81)
RDW: 19.2 % — ABNORMAL HIGH (ref 11.5–15.5)
WBC: 8.3 10*3/uL (ref 4.0–10.5)
nRBC: 9.7 % — ABNORMAL HIGH (ref 0.0–0.2)

## 2023-06-30 LAB — HEPARIN LEVEL (UNFRACTIONATED): Heparin Unfractionated: >1.1 [IU]/mL — ABNORMAL HIGH (ref 0.30–0.70)

## 2023-06-30 LAB — GLUCOSE, CAPILLARY
Glucose-Capillary: 121 mg/dL — ABNORMAL HIGH (ref 70–99)
Glucose-Capillary: 117 mg/dL — ABNORMAL HIGH (ref 70–99)

## 2023-06-30 LAB — UREA NITROGEN, URINE: Urea Nitrogen, Ur: 628 mg/dL

## 2023-06-30 MED ORDER — LIDOCAINE-PRILOCAINE 2.5-2.5 % EX CREA: 1.0000 | TOPICAL_CREAM | CUTANEOUS | Status: DC | PRN

## 2023-06-30 MED ORDER — HEPARIN SODIUM (PORCINE) 1000 UNIT/ML DIALYSIS: 1000.0000 [IU] | INTRAMUSCULAR | Status: DC | PRN

## 2023-06-30 MED ORDER — LIDOCAINE HCL (PF) 1 % IJ SOLN: 5.0000 mL | INTRAMUSCULAR | Status: DC | PRN

## 2023-06-30 MED ORDER — CHLORHEXIDINE GLUCONATE CLOTH 2 % EX PADS
6.0000 | MEDICATED_PAD | Freq: Every day | CUTANEOUS | Status: DC
  Administered 2023-06-30 – 2023-07-01 (×2): 6 via TOPICAL

## 2023-06-30 MED ORDER — LIDOCAINE-EPINEPHRINE 1 %-1:100000 IJ SOLN
INTRAMUSCULAR | Status: AC
Start: 2023-06-30 — End: ?
  Filled 2023-06-30: qty 1

## 2023-06-30 MED ORDER — AMIODARONE HCL IN DEXTROSE 360-4.14 MG/200ML-% IV SOLN
60.0000 mg/h | INTRAVENOUS | Status: AC
  Administered 2023-06-30 – 2023-07-01 (×2): 60 mg/h via INTRAVENOUS
  Filled 2023-06-30: qty 200

## 2023-06-30 MED ORDER — AMIODARONE LOAD VIA INFUSION
150.0000 mg | Freq: Once | INTRAVENOUS | Status: AC
  Administered 2023-06-30: 150 mg via INTRAVENOUS
  Filled 2023-06-30: qty 83.34

## 2023-06-30 MED ORDER — PENTAFLUOROPROP-TETRAFLUOROETH EX AERO: 1.0000 | INHALATION_SPRAY | CUTANEOUS | Status: DC | PRN

## 2023-06-30 MED ORDER — ALTEPLASE 2 MG IJ SOLR: 2.0000 mg | Freq: Once | INTRAMUSCULAR | Status: DC | PRN

## 2023-06-30 MED ORDER — ANTICOAGULANT SODIUM CITRATE 4% (200MG/5ML) IV SOLN: 5.0000 mL | Status: DC | PRN

## 2023-06-30 MED ORDER — HEPARIN (PORCINE) 25000 UT/250ML-% IV SOLN
1100.0000 [IU]/h | INTRAVENOUS | Status: DC
  Administered 2023-06-30: 1300 [IU]/h via INTRAVENOUS
  Administered 2023-07-01: 1100 [IU]/h via INTRAVENOUS
  Filled 2023-06-30: qty 250

## 2023-06-30 MED ORDER — SODIUM CHLORIDE 0.9 % IV SOLN
INTRAVENOUS | Status: AC
  Administered 2023-06-30: 75 mL via INTRAVENOUS

## 2023-06-30 MED ORDER — ATORVASTATIN CALCIUM 80 MG PO TABS: 80.0000 mg | ORAL_TABLET | Freq: Every evening | ORAL | Status: DC

## 2023-06-30 MED ORDER — HEPARIN SODIUM (PORCINE) 1000 UNIT/ML IJ SOLN
INTRAMUSCULAR | Status: AC
  Filled 2023-06-30: qty 10

## 2023-06-30 MED ORDER — LORAZEPAM 2 MG/ML IJ SOLN
0.5000 mg | Freq: Once | INTRAMUSCULAR | Status: AC
  Administered 2023-06-30: 0.5 mg via INTRAVENOUS

## 2023-06-30 MED ORDER — AMIODARONE HCL IN DEXTROSE 360-4.14 MG/200ML-% IV SOLN
30.0000 mg/h | INTRAVENOUS | Status: DC
  Administered 2023-07-01 (×2): 30 mg/h via INTRAVENOUS
  Filled 2023-06-30 (×2): qty 200

## 2023-06-30 NOTE — Progress Notes (Incomplete)
 06/30/23  23:53  Test: Lactic Acid  Critical Value: 5.3  Name of Provider Notified:  Opyd paged 06/30/23 23:50  Orders Received? Or Actions Taken?:

## 2023-06-30 NOTE — Plan of Care (Addendum)
  Problem: Fluid Volume: Goal: Compliance with measures to maintain balanced fluid volume will improve Outcome: Not Progressing

## 2023-06-30 NOTE — Progress Notes (Signed)
 [facility] Progress Note   Subjective:   Remains intermittently agitated and confused.  Poor po intake.  UOP only yest.  Wife bedside.  Had temp HD catheter placed this AM.   Objective Vitals:   06/30/23 0341 06/30/23 0540 06/30/23 0748 06/30/23 0846  BP: (!) 125/105  (!) 126/104 (!) 127/103  Pulse: (!) 111  (!) 51   Resp: 19  (!) 22 (!) 27  Temp: (!) 97 F (36.1 C) (!) 97.1 F (36.2 C) (!) 97.1 F (36.2 C)   TempSrc: Axillary Axillary Axillary   SpO2: 99%  100%   Weight: 96.4 kg     Height:       Physical Exam General: comfortable in bed Heart: A fib with RVR in 110-120s on monitor Lungs: clear on RA 100% Abdomen:soft Extremities: trace ankle edema Neuro:  AOx2 - self, 11/04/1935mumbling, drowsy  Additional Objective Labs: Basic Metabolic Panel: Recent Labs  Lab 06/28/23 1034 06/28/23 1228 06/29/23 0548 06/30/23 0502  NA 138  --  138 137  K 5.2*  --  5.3* 5.1  CL 109  --  107 105  CO2 13*  --  15* 15*  GLUCOSE 127*  --  119* 137*  BUN 109*  --  123* 135*  CREATININE 3.80*  --  4.26* 5.25*  CALCIUM 9.1  --  8.6* 7.5*  PHOS  --  7.5*  --   --    Liver Function Tests: No results for input(s): "AST", "ALT", "ALKPHOS", "BILITOT", "PROT", "ALBUMIN" in the last 168 hours. No results for input(s): "LIPASE", "AMYLASE" in the last 168 hours. CBC: Recent Labs  Lab 06/28/23 1034 06/29/23 0548 06/30/23 0502  WBC 8.3 8.0 8.3  HGB 10.7* 10.5* 10.5*  HCT 33.4* 32.8* 32.2*  MCV 94.4 92.7 92.0  PLT PLATELET CLUMPS NOTED ON SMEAR, UNABLE TO ESTIMATE PLATELET CLUMPS NOTED ON SMEAR, UNABLE TO ESTIMATE PLATELET CLUMPS NOTED ON SMEAR, UNABLE TO ESTIMATE   Blood Culture    Component Value Date/Time   SDES URINE, RANDOM 06/29/2023 1100   SPECREQUEST  06/29/2023 1100    NONE Reflexed from [id number] Performed at [facility], [address] [place], [place] [address]    CULT >=100,000 COLONIES/mL ESCHERICHIA COLI (A) 06/29/2023 1100    REPTSTATUS PENDING 06/29/2023 1100    Cardiac Enzymes: No results for input(s): "CKTOTAL", "CKMB", "CKMBINDEX", "TROPONINI" in the last 168 hours. CBG: Recent Labs  Lab 06/29/23 0750 06/29/23 1217 06/29/23 1611 06/29/23 2123 06/30/23 0748  GLUCAP 118* 125* 131* 128* 121*   Iron Studies: No results for input(s): "IRON", "TIBC", "TRANSFERRIN", "FERRITIN" in the last 72 hours. @lablastinr3 @ Studies/Results: US RENAL Result Date: 06/28/2023 CLINICAL DATA:  Acute kidney injury EXAM: RENAL / URINARY TRACT ULTRASOUND COMPLETE COMPARISON:  CT 12/10/2017 FINDINGS: Right Kidney: Renal measurements: 9.5 x 6.1 x 5.3 cm = volume: 161.5 mL. No collecting system dilatation perinephric fluid. Multiple simple cysts are identified as seen on previous examination. These includes central focus measuring 2.7 x 2.9 x 2.7 cm and lower pole focus measuring 3.9 x 3.0 x 3.9 cm. Left Kidney: Renal measurements: 10.0 x 6.3 x 5.3 cm = volume: 176.6 mL. No collecting system dilatation perinephric fluid. Simple appearing cysts are seen including measuring 2.6 x 2.8 x 2.7 cm Bladder: Underdistended. Other: Prostate poorly defined but felt to be enlarged. Tiny left pleural effusion IMPRESSION: No collecting system dilatation. Bilateral renal cysts are identified. These were seen on previous CT. Enlarged prostate but poorly defined  on this examination. Tiny left pleural effusion Electronically Signed   By: Robert Horn M.D.   On: 06/28/2023 15:53   Medications:  cefTRIAXone (ROCEPHIN)  IV 1 g (06/30/23 1017)    apixaban  2.5 mg Oral BID   Chlorhexidine Gluconate Cloth  6 each Topical Q0600   ezetimibe  10 mg Oral Daily   metoprolol tartrate  5 mg Intravenous Q6H   rosuvastatin  40 mg Oral QPM   Assessment/PlanRonald Horn is an 89 y.o. male with HFrEF, HTN, HL, DM, CAD, PAD s/p R partial foot amputation, h/o severe AS s/p TAVR 2018, A fib on eliquis currently being admitted with weakness and dyspnea and nephrology is  consulted for evaluation and management of AKI on CKD.    **AKI on CKD3b: baseline kidney function with Cr in the 1.6-1.8mg /dL dating back at least a few years, presented with BUN 109, Cr 3.8.  By history and exam he was hypovolemic on presention, which is supported by low FeNa. UA +blood but none on microscopy, appearing like UTI, culture pending.  Renal US no obstruction.    Holding SGLT2i for now (other meds inc MRB, entresto, lasix were on hold at last d/c already).   In setting of worsening kidney function and developing uremic symptoms (confusion), temp HD catheter placed 4/2 and 1st HD 4/2. Due to poor po intake reorder MIVF 75/hr x 12h today. Cont strict I/Os, daily weights.  Avoid nephrotoxins and hypotension - cont midodrine as needed for BP support.      **Hyperkalemia: mild, HD  **suspected UTI:  UA many WBC and bacteria; ordered culture - E coli sens pending. On ceftriaxone.   **AGMA: in the setting of severe AKI.  VBG confirms metabolic with resp compensation.  S/p bicarb gtt, starting dialysis.   **Chronic biV HFrEF:  05/2023 TTE LVEF 25%, RV decreased. Appears compensated currently.  Meds limited by GFR and BPs.  Holding diuretics in setting of hypovolemia; on isotonic fluids currently.   **Dyspnea:  CXR fairly clear, suspect compensatory hyperventilation due to met acidosis which is being treated.   **A fib with RVR:  cardiology following - tachycardia thought to be compensatory for hypovolemia though continues this AM, remains on IVF.  On eliquis and BB.    **Normocytic anemia:  Hb in the 10s and stable c/w recent admission.  Follow.    **recent CVA: thought to be embolic in setting of new A fib, tx with thrombolytics earlier this month.   Will follow, reach out with concerns.   Estill Bakes MD 06/30/2023, 11:43 AM  [facility] Pager: [phone]

## 2023-06-30 NOTE — Progress Notes (Signed)
 PROGRESS NOTE    Robert Horn  UJW:[id number] DOB: 23-Jan-1934 DOA: 06/28/2023 PCP: Renford Dills, MD   Brief Narrative:  The patient is an 89 y.o. male with medical history significant of chronic combined systolic and diastolic heart failure, atrial fibrillation on Eliquis, severe AS s/p TAVR (2018), PAD s/p right transmetatarsal amputation, hx of colon cancer s/p colectomy, CKD stage IIIb, type 2 diabetes with neuropathy, hypertension, hyperlipidemia, anemia of chronic disease, recent history of CVA presented with shortness of breath and generalized weakness.  Recently hospitalized for cardioembolic stroke from atrial fibrillation.  Noted to have worsening renal function.  He was hospitalized for further management.  Cardiology and nephrology have been consulted and given his worsening renal function a TDC was placed and he underwent dialysis.  Cardiology now concerned about low output state and discontinue his metoprolol and starting IV Amiodarone.  Assessment and Plan:  Atrial fibrillation with RVR/Elevated Troponin: Recently diagnosed with atrial fibrillation during recent hospitalization for stroke. Toprol dose was recently increased as well. Came in with RVR.  Cardiology was consulted  Patient noted to be just on metoprolol this morning but this is now been discontinued and cardiology is now starting him on IV amiodarone.  Was on Eliquis and now on IV Heparin gtt Heart rate to be monitored closely on telemetry. TSH is normal at 1.02. Elevated troponin possibly due to demand ischemia.  Per cardiology at the [facility] she is low and lactate is very nuclear consider involving advanced heart failure service.   AKI on CKD stage IIIb/ Elevated Anion Gap Metabolic Acidosis/ Hyperkalemia: Baseline creatinine is around 1.6-1.8. BUN/Cr Trend worsening and ATN is suspected: Recent Labs  Lab 06/17/23 0358 06/19/23 0616 06/20/23 0650 06/21/23 0754 06/28/23 1034 06/29/23 0548 06/30/23 0502  BUN  31* 26* 26* 28* 109* 123* 135*  CREATININE 1.91* 1.60* 1.57* 1.59* 3.80* 4.26* 5.25*  -Has a MA w/ a CO2 of 15, AG of 17, and Chloride Level of 105; Currently on a Sodium Bicarbonate 150 mEQ Infusion @ 75 mL/hr x 1 day  -K+ Trend went from 5.2 -> 5.3 -> 5.1; S/p Loklema 10 g x1 -Renal U/S showed no Hydronephrosis; Continue to Monitor UOP -Avoid Nephrotoxic Medications, Contrast Dyes, Hypotension and Dehydration to Ensure Adequate Renal Perfusion and will need to Renally Adjust Meds. CTM and Trend Renal Function carefully and repeat CMP in the AM  -Nephrology following and appreciate Assistance; Underwent TDC placement and undergoing Dialysis this Afternoon   Chronic biventricular CHF/nonischemic cardiomyopathy: Echocardiogram earlier this month showed LVEF of 25 to 30% with global hypokinesis. Cardiology is following.  Cannot use ACE inhibitor or ARB due to his renal dysfunction. Noted to be on metoprolol but this was discontinued by cardiology..  Cardiology discussed with the advanced heart failure service and given as they are concerned about low output state and this is confirmed by the right heart cath in December 2024 with a Index of 1.95.  They feel that his renal failure is now established with [facility] from dialysis and to ensure no contribution to the output heart failure to obtain co-oximetry panel and obtain lactic.   Abnormal UA/acute E. coli UTI: Patient unable to say if he is having symptoms.  He is confused which could be from infection. Started on ceftriaxone and will continue pending culture results.  Follow-up on urine culture Sensitivities.   History of Essential Hypertension : It looks like he has had hypotension as well.  He was started on midodrine for low blood pressure.  Not  noted to be on midodrine currently.  Blood pressure is stable.  Seems to be tolerating metoprolol. CTM BP per Protocol. Last BP reading was 124/92.    Recent CVA, Cardioembolic: Continue Eliquis statin.  Received TNK during previous hospitalization.   Normocytic Anemia/ Anemia of Chronic Disease: Hgb/Hct appears stable and now 10.5/32.2. Check Anemia Panel in the AM. CTM for S/Sx of Bleeding; No evidence of overt bleeding.  Repeat CBC in the AM  HLD: C/w Atorvastatin 80 mg po Daily and Ezetimibe 10 mg po Daily   DMT2 with peripheral neuropathy: HbA1c was 6.7 recently.  Home medications currently on hold. Not on SSI. CBGs ranging from 117-128   Acute metabolic encephalopathy: Baseline mentation is not clear.  No encephalopathy was mentioned during previous hospitalization. This could be secondary to UTI and other acute medical issues such as Uremia. Continue to monitor.  No focal neurological deficits noted.  PT and OT evaluation. Delirium Precautions    DVT prophylaxis: Anticoagulated with Heparin gtt    Code Status: Full Code Family Communication: D/w Wife at Bedside  Disposition Plan:  Level of care: Telemetry Cardiac Status is: Inpatient Remains inpatient appropriate because: Needs further clinical improvement and clearance by Cardiology and Nephrology   Consultants:  Cardiology Nephrology  IR for [facility] placement   Procedures:  As delineated as above  Antimicrobials:  Anti-infectives (From admission, onward)    Start     Dose/Rate Route Frequency Ordered Stop   06/29/23 0900  cefTRIAXone (ROCEPHIN) 1 g in sodium chloride 0.9 % 100 mL IVPB        1 g 200 mL/hr over 30 Minutes Intravenous Every 24 hours 06/29/23 0812 07/06/23 0859       Subjective: Seen and examined at bedside and he is encephalopathic and little bit drowsy.  Had received his pain medication earlier.  Remains confused and undergoing dialysis.  Had [facility] placement this a.m.  No other concerns or complaints at this time.  Objective: Vitals:   06/30/23 1644 06/30/23 1651 06/30/23 1701 06/30/23 2006  BP:  (!) 137/105 (!) 114/101 (!) 124/92  Pulse:  (!) 139 (!) 135 (!) 112  Resp:  17  (!) 24  Temp:  98.1 F  (36.7 C)  (!) 97.1 F (36.2 C)  TempSrc:  Axillary    SpO2:  100%  95%  Weight: 97.9 kg 97.9 kg  97.9 kg  Height:        Intake/Output Summary (Last 24 hours) at 06/30/2023 2043 Last data filed at 06/30/2023 2006 Gross per 24 hour  Intake --  Output 0 ml  Net 0 ml   Filed Weights   06/30/23 1644 06/30/23 1651 06/30/23 2006  Weight: 97.9 kg 97.9 kg 97.9 kg   Examination: Physical Exam:  Constitutional: Overweight chronically ill-appearing African-American male who was confused and encephalopathic Respiratory: Diminished to auscultation bilaterally, no wheezing, rales, rhonchi or crackles. Normal respiratory effort and patient is not tachypenic. No accessory muscle use. Unlabored breathing.  Cardiovascular: RRR, no murmurs / rubs / gallops. S1 and S2 auscultated. No extremity edema. Abdomen: Soft, non-tender, Distended 2/2 body habitus. Bowel sounds positive.  GU: Deferred. Musculoskeletal: No clubbing / cyanosis of digits/nails. No joint deformity upper and lower extremities. Skin: No rashes, lesions, ulcers on a limited skin evaluation. No induration; Warm and dry.  Neurologic: Confused and does not participate in examination Psychiatric: Impaired judgment and insight  Data Reviewed: I have personally reviewed following labs and imaging studies  CBC: Recent Labs  Lab 06/28/23  1034 06/29/23 0548 06/30/23 0502  WBC 8.3 8.0 8.3  HGB 10.7* 10.5* 10.5*  HCT 33.4* 32.8* 32.2*  MCV 94.4 92.7 92.0  PLT PLATELET CLUMPS NOTED ON SMEAR, UNABLE TO ESTIMATE PLATELET CLUMPS NOTED ON SMEAR, UNABLE TO ESTIMATE PLATELET CLUMPS NOTED ON SMEAR, UNABLE TO ESTIMATE   Basic Metabolic Panel: Recent Labs  Lab 06/28/23 1034 06/28/23 1228 06/29/23 0548 06/30/23 0502  NA 138  --  138 137  K 5.2*  --  5.3* 5.1  CL 109  --  107 105  CO2 13*  --  15* 15*  GLUCOSE 127*  --  119* 137*  BUN 109*  --  123* 135*  CREATININE 3.80*  --  4.26* 5.25*  CALCIUM 9.1  --  8.6* 7.5*  MG  --  3.5*  --    --   PHOS  --  7.5*  --   --    GFR: Estimated Creatinine Clearance: 12 mL/min (A) (by C-G formula based on SCr of 5.25 mg/dL (H)). Liver Function Tests: No results for input(s): "AST", "ALT", "ALKPHOS", "BILITOT", "PROT", "ALBUMIN" in the last 168 hours. No results for input(s): "LIPASE", "AMYLASE" in the last 168 hours. No results for input(s): "AMMONIA" in the last 168 hours. Coagulation Profile: No results for input(s): "INR", "PROTIME" in the last 168 hours. Cardiac Enzymes: No results for input(s): "CKTOTAL", "CKMB", "CKMBINDEX", "TROPONINI" in the last 168 hours. BNP (last 3 results) No results for input(s): "PROBNP" in the last 8760 hours. HbA1C: No results for input(s): "HGBA1C" in the last 72 hours. CBG: Recent Labs  Lab 06/29/23 1217 06/29/23 1611 06/29/23 2123 06/30/23 0748 06/30/23 1212  GLUCAP 125* 131* 128* 121* 117*   Lipid Profile: No results for input(s): "CHOL", "HDL", "LDLCALC", "TRIG", "CHOLHDL", "LDLDIRECT" in the last 72 hours. Thyroid Function Tests: Recent Labs    06/28/23 1417  TSH 1.024   Anemia Panel: No results for input(s): "VITAMINB12", "FOLATE", "FERRITIN", "TIBC", "IRON", "RETICCTPCT" in the last 72 hours. Sepsis Labs: Recent Labs  Lab 06/28/23 2232 06/29/23 0548  LATICACIDVEN 2.9* 2.8*   Recent Results (from the past 240 hours)  Urine Culture     Status: Abnormal (Preliminary result)   Collection Time: 06/29/23 11:00 AM   Specimen: Urine, Random  Result Value Ref Range Status   Specimen Description URINE, RANDOM  Final   Special Requests   Final    NONE Reflexed from [id number] Performed at [facility], [address] [place], [place] [address]    Culture >=100,000 COLONIES/mL ESCHERICHIA COLI (A)  Final   Report Status PENDING  Incomplete    Radiology Studies: IR Fluoro Guide CV Line Right Result Date: 06/30/2023 INDICATION: 89 year old male referred for temporary hemodialysis catheter EXAM: IMAGE GUIDED TEMPORARY  HEMODIALYSIS CATHETER MEDICATIONS: None ANESTHESIA/SEDATION: None FLUOROSCOPY: Radiation Exposure Index (as provided by the fluoroscopic device): 0.5 mGy Kerma COMPLICATIONS: None PROCEDURE: Informed written consent was obtained from the patient's family after a discussion of the risks, benefits, and alternatives to treatment. Questions regarding the procedure were encouraged and answered. The right neck was prepped with chlorhexidine in a sterile fashion, and a sterile drape was applied covering the operative field. Maximum barrier sterile technique with sterile gowns and gloves were used for the procedure. A timeout was performed prior to the initiation of the procedure. A micropuncture kit was utilized to access the right internal jugular vein under direct, real-time ultrasound guidance after the overlying soft tissues were anesthetized with 1% lidocaine with epinephrine. Ultrasound image  documentation was performed. The microwire was kinked to measure appropriate catheter length. A stiff glidewire was advanced to the level of the IVC. A 16 cm hemodialysis catheter was then placed over the wire. Final catheter positioning was confirmed and documented with a spot radiographic image. The catheter aspirates and flushes normally. The catheter was flushed with appropriate volume heparin dwells. Dressings were applied. The patient tolerated the procedure well without immediate post procedural complication. IMPRESSION: Status post image guided temporary right IJ hemodialysis catheter Signed, Yvone Neu. Miachel Roux, RPVI [facility] Electronically Signed   By: Gilmer Mor D.O.   On: 06/30/2023 14:04   IR US Guide Vasc Access Right Result Date: 06/30/2023 INDICATION: 89 year old male referred for temporary hemodialysis catheter EXAM: IMAGE GUIDED TEMPORARY HEMODIALYSIS CATHETER MEDICATIONS: None ANESTHESIA/SEDATION: None FLUOROSCOPY: Radiation Exposure  Index (as provided by the fluoroscopic device): 0.5 mGy Kerma COMPLICATIONS: None PROCEDURE: Informed written consent was obtained from the patient's family after a discussion of the risks, benefits, and alternatives to treatment. Questions regarding the procedure were encouraged and answered. The right neck was prepped with chlorhexidine in a sterile fashion, and a sterile drape was applied covering the operative field. Maximum barrier sterile technique with sterile gowns and gloves were used for the procedure. A timeout was performed prior to the initiation of the procedure. A micropuncture kit was utilized to access the right internal jugular vein under direct, real-time ultrasound guidance after the overlying soft tissues were anesthetized with 1% lidocaine with epinephrine. Ultrasound image documentation was performed. The microwire was kinked to measure appropriate catheter length. A stiff glidewire was advanced to the level of the IVC. A 16 cm hemodialysis catheter was then placed over the wire. Final catheter positioning was confirmed and documented with a spot radiographic image. The catheter aspirates and flushes normally. The catheter was flushed with appropriate volume heparin dwells. Dressings were applied. The patient tolerated the procedure well without immediate post procedural complication. IMPRESSION: Status post image guided temporary right IJ hemodialysis catheter Signed, Yvone Neu. Miachel Roux, RPVI [facility] Electronically Signed   By: Gilmer Mor D.O.   On: 06/30/2023 14:04   Scheduled Meds:  amiodarone  150 mg Intravenous Once   atorvastatin  80 mg Oral QPM   Chlorhexidine Gluconate Cloth  6 each Topical Q0600   ezetimibe  10 mg Oral Daily   Continuous Infusions:  sodium chloride 75 mL (06/30/23 1314)   amiodarone 60 mg/hr (06/30/23 2029)   Followed by   amiodarone     cefTRIAXone (ROCEPHIN)  IV 1 g (06/30/23 1017)    heparin 1,300 Units/hr (06/30/23 1511)    LOS: 1 day   Marguerita Merles, DO [facility] Available via Epic secure chat 7am-7pm After these hours, please refer to coverage provider listed on [email or web address] 06/30/2023, 8:43 PM

## 2023-06-30 NOTE — Progress Notes (Addendum)
 Consulted for bleeding at insertion site of newly placed HD cath. Upon assessment edges of dressing are intact and bleeding is contained under dressing. Pt is receiving Heparin infusion. Unit RN at bedside stated heparin level will be drawn at 2230. Since this is newly placed HD cath advised to leave dressing in place at this time. If bleeding continues or increases advised to contact provider and have provider order thrombipad and place new dressing. Also advised to consult IV team if further assistance is needed

## 2023-06-30 NOTE — Progress Notes (Signed)
 06/30/23  23:53  Test: Lactic Acid  Critical Value: 5.3  Name of Provider Notified:  Opyd paged 06/30/23 23:50

## 2023-06-30 NOTE — Progress Notes (Signed)
 Received patient in bed to unit.  Alert and oriented x1 Informed consent signed and in chart.   TX duration: 2 Hours  Patient tolerated well, anxious at times  Transported back to the room via bed Alert, without acute distress.  Hand-off given to patient's nurse. Heather RN  Access used: RIJ  Access issues: None  Total UF removed:0.0 Medication(s) given: None Post HD weight: 97.9 kg Post HD VS: 124/92, 100% on RA, 23 Resp , Pulse 117,    Retaj Hilbun S Donnella Morford RN,DNP [facility]

## 2023-06-30 NOTE — Progress Notes (Signed)
   Patient Name: Robert Horn Date of Encounter: 06/30/2023 [facility] Cardiologist: Chilton Si, MD   Interval Summary  .    Agitated and delirious. Renal failure worsening.   Vital Signs .    Vitals:   06/30/23 0341 06/30/23 0540 06/30/23 0748 06/30/23 0846  BP: (!) 125/105  (!) 126/104 (!) 127/103  Pulse: (!) 111  (!) 51   Resp: 19  (!) 22 (!) 27  Temp: (!) 97 F (36.1 C) (!) 97.1 F (36.2 C) (!) 97.1 F (36.2 C)   TempSrc: Axillary Axillary Axillary   SpO2: 99%  100%   Weight: 96.4 kg     Height:        Intake/Output Summary (Last 24 hours) at 06/30/2023 1143 Last data filed at 06/29/2023 1800 Gross per 24 hour  Intake 228.69 ml  Output 100 ml  Net 128.69 ml      06/30/2023    3:41 AM 06/29/2023    4:30 AM 06/28/2023    9:29 PM  Last 3 Weights  Weight (lbs) 212 lb 9.6 oz 212 lb 4.9 oz 207 lb 14.3 oz  Weight (kg) 96.435 kg 96.3 kg 94.3 kg      Telemetry/ECG    Afib rates 120-130 - Personally Reviewed  Physical Exam .   GEN: agitated Neck: No JVD Cardiac: iRRR, no murmurs Respiratory: Clear to auscultation bilaterally. GI: Soft, nontender, non-distended  MS: mild edema  Assessment & Plan .     Chronic biventricular heart failure  Nonischemic cardiomyopathy - not taking po meds due to encephalopathy.    Dyspnea - may be due to compensation of metabolic derangements   Atrial fibrillation, recently diagnosed 05/2023 Anticoagulated on low-dose Eliquis  CHA2DS2-VASc score is 8  - receiving IV metoprolol due to not taking po. Rates not well controlled, continue IV boluses. Due to interruptions in [facility], would not use IV amiodarone unless needed due to hemodynamic instability to avoid chemical cardioversion.  - I will start iv heparin for anticoagulation in lieu of eliquis which he has not been able to take.    Elevated troponin 205 > 215 Likely in the setting of demand ischemia secondary to tachyarrhythmia    Hypertension - unable to take po,  use IV anti htn as needed.   Hyperlipidemia  06/16/2023: ALT 30 06/17/2023: HDL 39; LDL Cholesterol 66  Continue Zetia 10 mg daily  Continue Crestor 40 mg daily    PAD s/p partial right foot amputations 01/2023 Severe AS s/p 26mm Edwards Sapien TAVR 11/2016  Echo 05/2023 showed: mild MR, mild to moderate TR, no perivalvular leak, no AR   Per primary AKI on CKD stage 3b - progressing renal failure, plans for HD per neph. Electrolyte disturbances  History of stroke, 05/2023 History of TIA Type 2 diabetes History of colon cancer s/p colectomy   For questions or updates, please contact [facility] Please consult [email or web address] for contact info under        Signed, Parke Poisson, MD

## 2023-06-30 NOTE — Plan of Care (Signed)
  Problem: Nutrition: Goal: Adequate nutrition will be maintained Outcome: Not Progressing   Problem: Pain Managment: Goal: General experience of comfort will improve and/or be controlled Outcome: Progressing   Problem: Safety: Goal: Ability to remain free from injury will improve Outcome: Progressing   Problem: Skin Integrity: Goal: Risk for impaired skin integrity will decrease Outcome: Progressing   Problem: Fluid Volume: Goal: Compliance with measures to maintain balanced fluid volume will improve Outcome: Not Progressing

## 2023-06-30 NOTE — Progress Notes (Signed)
 Briefly discussed care with the advanced heart failure service.  I am concerned about a low output state and this is confirmed by right heart cath from December 2024 with a cardiac index of 1.95.  Renal failure now necessitating at least short-term dialysis.  To ensure no contribution of low output heart failure, we will obtain a Co. oximetry panel off of the HD line and obtain a lactate.  Metoprolol discontinued given negative inotropic effects, given continued atrial fibrillation, may benefit hemodynamically from better rate control will start IV amiodarone.  He has only had brief interruptions in his anticoagulation recently on the morning of admission, and then briefly missing 1-2 doses of Eliquis while encephalopathic at hospital, hopefully the risk is overall low of chemical cardioversion though this is certainly a possibility.  If Co. oximetry is low and lactate rising, consider involving heart failure service.  Parke Poisson, MD

## 2023-06-30 NOTE — Procedures (Signed)
 Interventional Radiology Procedure Note  Procedure: Placement of a right IJ temp triple lumen HD cath.  Tip is positioned at the superior cavoatrial junction and catheter is ready for immediate use.  Complications: None Recommendations:  - Ok to use - Do not submerge - Routine line care   Signed,  Yvone Neu. Loreta Ave, DO

## 2023-06-30 NOTE — Progress Notes (Addendum)
 PHARMACY - ANTICOAGULATION CONSULT NOTE  Pharmacy Consult for heparin Indication: atrial fibrillation  Allergies  Allergen Reactions   Ticagrelor Other (See Comments)    Unknown     Patient Measurements: Height: 6' (182.9 cm) Weight: 96.4 kg (212 lb 9.6 oz) IBW/kg (Calculated) : 77.6 HEPARIN DW (KG): 94.3  Vital Signs: Temp: 97.3 F (36.3 C) (04/02 1301) Temp Source: Rectal (04/02 1301) BP: 126/96 (04/02 1301) Pulse Rate: 116 (04/02 1301)  Labs: Recent Labs    06/28/23 1034 06/28/23 1228 06/28/23 1427 06/28/23 2232 06/29/23 0548 06/30/23 0502  HGB 10.7*  --   --   --  10.5* 10.5*  HCT 33.4*  --   --   --  32.8* 32.2*  PLT PLATELET CLUMPS NOTED ON SMEAR, UNABLE TO ESTIMATE  --   --   --  PLATELET CLUMPS NOTED ON SMEAR, UNABLE TO ESTIMATE PLATELET CLUMPS NOTED ON SMEAR, UNABLE TO ESTIMATE  CREATININE 3.80*  --   --   --  4.26* 5.25*  TROPONINIHS 205* 215* 226* 230*  --   --     Estimated Creatinine Clearance: 11.9 mL/min (A) (by C-G formula based on SCr of 5.25 mg/dL (H)).   Medical History: Past Medical History:  Diagnosis Date   Anemia    Anxiety    situational- surgery   Asthma    as a child   Cancer (HCC) 2019   colon- colectomy    CHF (congestive heart failure) (HCC)    Chronic kidney disease    followed by Dr. Nehemiah Settle   Coronary artery disease    Diabetes mellitus without complication (HCC)    Type II   Dyslipidemia 10/27/2015   Dyspnea    w/ exertion    Elevated PSA 10/27/2015   Erectile dysfunction 10/27/2015   Heart murmur    Hypertension 10/27/2015   Hypogonadism male 10/27/2015   Neuropathy    Obesity 10/27/2015   Peripheral vascular disease (HCC)    Pneumonia 10/27/2015   pt states was 1982   Rotator cuff tear 10/27/2015    Medications:  Medications Prior to Admission  Medication Sig Dispense Refill Last Dose/Taking   apixaban (ELIQUIS) 2.5 MG TABS tablet Take 1 tablet (2.5 mg total) by mouth 2 (two) times daily. 60 tablet 1  06/27/2023 at  9:30 AM   benzonatate (TESSALON) 100 MG capsule Take 100 mg by mouth 3 (three) times daily as needed for cough.   Unknown   Cholecalciferol (VITAMIN D) 50 MCG (2000 UT) tablet Take 2,000 Units by mouth daily.   06/27/2023   diclofenac Sodium (VOLTAREN) 1 % GEL Apply 1 Application topically as needed (pain).   Unknown   ezetimibe (ZETIA) 10 MG tablet Take 1 tablet (10 mg total) by mouth daily. 90 tablet 3 06/27/2023   JARDIANCE 25 MG TABS tablet Take 25 mg by mouth daily.   06/27/2023   metoprolol succinate (TOPROL-XL) 25 MG 24 hr tablet Take 1 tablet (25 mg total) by mouth daily. (Patient taking differently: Take 50 mg by mouth daily.) 30 tablet 1 06/27/2023   midodrine (PROAMATINE) 2.5 MG tablet Take 1 tablet (2.5 mg total) by mouth 2 (two) times daily with a meal. 60 tablet 1 06/27/2023   rosuvastatin (CRESTOR) 40 MG tablet Take 1 tablet (40 mg total) by mouth daily. (Patient taking differently: Take 40 mg by mouth every evening.) 30 tablet 5 06/27/2023   VITAMIN B COMPLEX-C PO Take 1 tablet by mouth daily.   06/27/2023   furosemide (LASIX) 40  MG tablet Take 40 mg by mouth daily. (Patient not taking: Reported on 06/28/2023)   Not Taking   furosemide (LASIX) 80 MG tablet Take 40 mg by mouth every other day. Alternating with 40mg  tablet (Patient not taking: Reported on 06/28/2023)   Not Taking   OZEMPIC, 0.25 OR 0.5 MG/DOSE, 2 MG/3ML SOPN Inject 0.25 mg into the skin once a week. (Patient not taking: Reported on 06/28/2023)   Not Taking   Scheduled:   apixaban  2.5 mg Oral BID   atorvastatin  80 mg Oral QPM   Chlorhexidine Gluconate Cloth  6 each Topical Q0600   ezetimibe  10 mg Oral Daily   metoprolol tartrate  5 mg Intravenous Q6H    Assessment: 89 yo male with history of afib on apixaban at home. He is not able to take the medication due to confusion. Pharmacy consulted to dose heparin  -last dose of apixaban 4/1 in the morning -hg= 10.5 -He is also noted with AKI on CKD and s/p  temp HD cath this morning  Goal of Therapy:  Heparin level 0.3-0.7 units/ml aPTT 66-102 seconds Monitor platelets by anticoagulation protocol: Yes   Plan:  -Start heparin at 1300 units/hr -aPTT and heparin level in 8 hrs -daily aPTT, heparin level and CBC  Harland German, PharmD Clinical Pharmacist **Pharmacist phone directory can now be found on [email or web address] ([email or web address]).  Listed under [facility].

## 2023-06-30 NOTE — Plan of Care (Signed)
  Problem: Fluid Volume: Goal: Ability to maintain a balanced intake and output will improve Outcome: Progressing   Problem: Nutritional: Goal: Maintenance of adequate nutrition will improve Outcome: Progressing   Problem: Skin Integrity: Goal: Risk for impaired skin integrity will decrease Outcome: Progressing   Problem: Tissue Perfusion: Goal: Adequacy of tissue perfusion will improve Outcome: Progressing   

## 2023-07-01 ENCOUNTER — Inpatient Hospital Stay (HOSPITAL_COMMUNITY)

## 2023-07-01 ENCOUNTER — Inpatient Hospital Stay (HOSPITAL_COMMUNITY): Admitting: Certified Registered Nurse Anesthetist

## 2023-07-01 DIAGNOSIS — R579 Shock, unspecified: Secondary | ICD-10-CM

## 2023-07-01 DIAGNOSIS — E875 Hyperkalemia: Secondary | ICD-10-CM | POA: Diagnosis not present

## 2023-07-01 DIAGNOSIS — G9341 Metabolic encephalopathy: Secondary | ICD-10-CM | POA: Diagnosis not present

## 2023-07-01 DIAGNOSIS — I469 Cardiac arrest, cause unspecified: Secondary | ICD-10-CM

## 2023-07-01 DIAGNOSIS — N1832 Chronic kidney disease, stage 3b: Secondary | ICD-10-CM

## 2023-07-01 DIAGNOSIS — J9601 Acute respiratory failure with hypoxia: Secondary | ICD-10-CM

## 2023-07-01 DIAGNOSIS — I5020 Unspecified systolic (congestive) heart failure: Secondary | ICD-10-CM

## 2023-07-01 DIAGNOSIS — R7401 Elevation of levels of liver transaminase levels: Secondary | ICD-10-CM

## 2023-07-01 LAB — HEPATITIS B SURFACE ANTIBODY, QUANTITATIVE: Hep B S AB Quant (Post): <3.5 m[IU]/mL — ABNORMAL LOW

## 2023-07-01 LAB — POCT I-STAT 7, (LYTES, BLD GAS, ICA,H+H)
Acid-base deficit: 17 mmol/L — ABNORMAL HIGH (ref 0.0–2.0)
Bicarbonate: 8.6 mmol/L — ABNORMAL LOW (ref 20.0–28.0)
Calcium, Ion: 0.72 mmol/L — CL (ref 1.15–1.40)
HCT: 32 % — ABNORMAL LOW (ref 39.0–52.0)
Hemoglobin: 10.9 g/dL — ABNORMAL LOW (ref 13.0–17.0)
O2 Saturation: 100 %
Patient temperature: 34.9
Potassium: 5.8 mmol/L — ABNORMAL HIGH (ref 3.5–5.1)
Sodium: 134 mmol/L — ABNORMAL LOW (ref 135–145)
TCO2: 9 mmol/L — ABNORMAL LOW (ref 22–32)
pCO2 arterial: 18.5 mmHg — CL (ref 32–48)
pH, Arterial: 7.265 — ABNORMAL LOW (ref 7.35–7.45)
pO2, Arterial: 524 mmHg — ABNORMAL HIGH (ref 83–108)

## 2023-07-01 LAB — BASIC METABOLIC PANEL WITH GFR
Anion gap: 29 — ABNORMAL HIGH (ref 5–15)
BUN: 115 mg/dL — ABNORMAL HIGH (ref 8–23)
CO2: 8 mmol/L — ABNORMAL LOW (ref 22–32)
Calcium: 6.5 mg/dL — ABNORMAL LOW (ref 8.9–10.3)
Chloride: 99 mmol/L (ref 98–111)
Creatinine, Ser: 5.3 mg/dL — ABNORMAL HIGH (ref 0.61–1.24)
GFR, Estimated: 10 mL/min — ABNORMAL LOW (ref >60)
Glucose, Bld: 208 mg/dL — ABNORMAL HIGH (ref 70–99)
Potassium: 6.1 mmol/L — ABNORMAL HIGH (ref 3.5–5.1)
Sodium: 136 mmol/L (ref 135–145)

## 2023-07-01 LAB — COMPREHENSIVE METABOLIC PANEL WITH GFR
ALT: 961 U/L — ABNORMAL HIGH (ref 0–44)
AST: 2263 U/L — ABNORMAL HIGH (ref 15–41)
Albumin: 3.1 g/dL — ABNORMAL LOW (ref 3.5–5.0)
Alkaline Phosphatase: 177 U/L — ABNORMAL HIGH (ref 38–126)
Anion gap: 27 — ABNORMAL HIGH (ref 5–15)
BUN: 111 mg/dL — ABNORMAL HIGH (ref 8–23)
CO2: 10 mmol/L — ABNORMAL LOW (ref 22–32)
Calcium: 6.8 mg/dL — ABNORMAL LOW (ref 8.9–10.3)
Chloride: 98 mmol/L (ref 98–111)
Creatinine, Ser: 5.14 mg/dL — ABNORMAL HIGH (ref 0.61–1.24)
GFR, Estimated: 10 mL/min — ABNORMAL LOW (ref >60)
Glucose, Bld: 60 mg/dL — ABNORMAL LOW (ref 70–99)
Potassium: 6 mmol/L — ABNORMAL HIGH (ref 3.5–5.1)
Sodium: 135 mmol/L (ref 135–145)
Total Bilirubin: 6.6 mg/dL — ABNORMAL HIGH (ref 0.0–1.2)
Total Protein: 6.6 g/dL (ref 6.5–8.1)

## 2023-07-01 LAB — CBC WITH DIFFERENTIAL/PLATELET
Abs Immature Granulocytes: 0.21 10*3/uL — ABNORMAL HIGH (ref 0.00–0.07)
Basophils Absolute: 0 10*3/uL (ref 0.0–0.1)
Basophils Relative: 0 %
Eosinophils Absolute: 0 10*3/uL (ref 0.0–0.5)
Eosinophils Relative: 0 %
HCT: 33.8 % — ABNORMAL LOW (ref 39.0–52.0)
Hemoglobin: 10.9 g/dL — ABNORMAL LOW (ref 13.0–17.0)
Immature Granulocytes: 2 %
Lymphocytes Relative: 0 %
Lymphs Abs: 0 10*3/uL — ABNORMAL LOW (ref 0.7–4.0)
MCH: 30.1 pg (ref 26.0–34.0)
MCHC: 32.2 g/dL (ref 30.0–36.0)
MCV: 93.4 fL (ref 80.0–100.0)
Monocytes Absolute: 0.7 10*3/uL (ref 0.1–1.0)
Monocytes Relative: 7 %
Neutro Abs: 8.9 10*3/uL — ABNORMAL HIGH (ref 1.7–7.7)
Neutrophils Relative %: 91 %
Platelets: UNDETERMINED 10*3/uL (ref 150–400)
RBC: 3.62 MIL/uL — ABNORMAL LOW (ref 4.22–5.81)
RDW: 20 % — ABNORMAL HIGH (ref 11.5–15.5)
Smear Review: UNDETERMINED
WBC: 9.8 10*3/uL (ref 4.0–10.5)
nRBC: 13.2 % — ABNORMAL HIGH (ref 0.0–0.2)

## 2023-07-01 LAB — BLOOD GAS, ARTERIAL
Acid-base deficit: 15.1 mmol/L — ABNORMAL HIGH (ref 0.0–2.0)
Bicarbonate: 8.6 mmol/L — ABNORMAL LOW (ref 20.0–28.0)
O2 Saturation: 100 %
Patient temperature: 35.6
pCO2 arterial: <18 mmHg — CL (ref 32–48)
pH, Arterial: 7.33 — ABNORMAL LOW (ref 7.35–7.45)
pO2, Arterial: 276 mmHg — ABNORMAL HIGH (ref 83–108)

## 2023-07-01 LAB — LACTIC ACID, PLASMA
Lactic Acid, Venous: >9 mmol/L (ref 0.5–1.9)
Lactic Acid, Venous: >9 mmol/L (ref 0.5–1.9)

## 2023-07-01 LAB — GLUCOSE, CAPILLARY
Glucose-Capillary: 23 mg/dL — CL (ref 70–99)
Glucose-Capillary: 30 mg/dL — CL (ref 70–99)
Glucose-Capillary: 106 mg/dL — ABNORMAL HIGH (ref 70–99)
Glucose-Capillary: 59 mg/dL — ABNORMAL LOW (ref 70–99)
Glucose-Capillary: 67 mg/dL — ABNORMAL LOW (ref 70–99)
Glucose-Capillary: 202 mg/dL — ABNORMAL HIGH (ref 70–99)
Glucose-Capillary: 165 mg/dL — ABNORMAL HIGH (ref 70–99)

## 2023-07-01 LAB — URINE CULTURE: Culture: >=100000 — AB

## 2023-07-01 LAB — CORTISOL: Cortisol, Plasma: >100 ug/dL

## 2023-07-01 LAB — APTT: aPTT: 198 s (ref 24–36)

## 2023-07-01 LAB — MAGNESIUM: Magnesium: 3.7 mg/dL — ABNORMAL HIGH (ref 1.7–2.4)

## 2023-07-01 LAB — MRSA NEXT GEN BY PCR, NASAL: MRSA by PCR Next Gen: NOT DETECTED

## 2023-07-01 LAB — PHOSPHORUS: Phosphorus: 10.7 mg/dL — ABNORMAL HIGH (ref 2.5–4.6)

## 2023-07-01 LAB — PROCALCITONIN: Procalcitonin: 0.98 ng/mL

## 2023-07-01 MED ORDER — SODIUM BICARBONATE 8.4 % IV SOLN
100.0000 meq | Freq: Once | INTRAVENOUS | Status: AC
  Administered 2023-07-01: 100 meq via INTRAVENOUS
  Filled 2023-07-01: qty 100

## 2023-07-01 MED ORDER — FENTANYL 2500MCG IN NS 250ML (10MCG/ML) PREMIX INFUSION: 0.0000 ug/h | INTRAVENOUS | Status: DC

## 2023-07-01 MED ORDER — FENTANYL 2500MCG IN NS 250ML (10MCG/ML) PREMIX INFUSION
25.0000 ug/h | INTRAVENOUS | Status: DC
  Administered 2023-07-01: 25 ug/h via INTRAVENOUS
  Filled 2023-07-01: qty 250

## 2023-07-01 MED ORDER — ONDANSETRON HCL 4 MG/2ML IJ SOLN: 4.0000 mg | Freq: Four times a day (QID) | INTRAMUSCULAR | Status: DC | PRN

## 2023-07-01 MED ORDER — ACETAMINOPHEN 650 MG RE SUPP: 650.0000 mg | Freq: Four times a day (QID) | RECTAL | Status: DC | PRN

## 2023-07-01 MED ORDER — FENTANYL CITRATE PF 50 MCG/ML IJ SOSY: 25.0000 ug | PREFILLED_SYRINGE | Freq: Once | INTRAMUSCULAR | Status: DC

## 2023-07-01 MED ORDER — CALCIUM GLUCONATE 10 % IV SOLN
1.0000 g | Freq: Once | INTRAVENOUS | Status: DC
Start: 2023-07-01 — End: 2023-07-01

## 2023-07-01 MED ORDER — GLYCOPYRROLATE 0.2 MG/ML IJ SOLN
0.2000 mg | INTRAMUSCULAR | Status: DC | PRN
  Administered 2023-07-01: 0.2 mg via INTRAVENOUS
  Filled 2023-07-01: qty 1

## 2023-07-01 MED ORDER — SODIUM CHLORIDE 0.9 % IV SOLN
100.0000 mg | Freq: Two times a day (BID) | INTRAVENOUS | Status: DC
  Filled 2023-07-01 (×2): qty 100

## 2023-07-01 MED ORDER — FENTANYL BOLUS VIA INFUSION
100.0000 ug | INTRAVENOUS | Status: DC | PRN
  Administered 2023-07-01: 100 ug via INTRAVENOUS

## 2023-07-01 MED ORDER — GLYCOPYRROLATE 0.2 MG/ML IJ SOLN: 0.2000 mg | INTRAMUSCULAR | Status: DC | PRN

## 2023-07-01 MED ORDER — NOREPINEPHRINE 4 MG/250ML-% IV SOLN
0.0000 ug/min | INTRAVENOUS | Status: DC
Start: 2023-07-01 — End: 2023-07-01

## 2023-07-01 MED ORDER — SODIUM BICARBONATE 8.4 % IV SOLN
50.0000 meq | Freq: Once | INTRAVENOUS | Status: AC
  Administered 2023-07-01: 50 meq via INTRAVENOUS
  Filled 2023-07-01: qty 50

## 2023-07-01 MED ORDER — DEXTROSE 50 % IV SOLN
12.5000 g | INTRAVENOUS | Status: AC
  Administered 2023-07-01: 12.5 g via INTRAVENOUS

## 2023-07-01 MED ORDER — FENTANYL BOLUS VIA INFUSION
25.0000 ug | INTRAVENOUS | Status: DC | PRN
  Administered 2023-07-01: 100 ug via INTRAVENOUS

## 2023-07-01 MED ORDER — NOREPINEPHRINE 4 MG/250ML-% IV SOLN
2.0000 ug/min | INTRAVENOUS | Status: DC
  Administered 2023-07-01 (×2): 2 ug/min via INTRAVENOUS

## 2023-07-01 MED ORDER — FENTANYL CITRATE PF 50 MCG/ML IJ SOSY: 25.0000 ug | PREFILLED_SYRINGE | INTRAMUSCULAR | Status: DC | PRN

## 2023-07-01 MED ORDER — CALCIUM GLUCONATE-NACL 1-0.675 GM/50ML-% IV SOLN
1.0000 g | Freq: Once | INTRAVENOUS | Status: AC
  Administered 2023-07-01: 1000 mg via INTRAVENOUS
  Filled 2023-07-01: qty 50

## 2023-07-01 MED ORDER — HALOPERIDOL LACTATE 5 MG/ML IJ SOLN: 2.5000 mg | INTRAMUSCULAR | Status: DC | PRN

## 2023-07-01 MED ORDER — DOCUSATE SODIUM 50 MG/5ML PO LIQD: 100.0000 mg | Freq: Two times a day (BID) | ORAL | Status: DC

## 2023-07-01 MED ORDER — ACETAMINOPHEN 325 MG PO TABS: 650.0000 mg | ORAL_TABLET | Freq: Four times a day (QID) | ORAL | Status: DC | PRN

## 2023-07-01 MED ORDER — ATROPINE SULFATE 1 MG/10ML IJ SOSY
PREFILLED_SYRINGE | INTRAMUSCULAR | Status: AC
  Filled 2023-07-01: qty 10

## 2023-07-01 MED ORDER — POLYETHYLENE GLYCOL 3350 17 G PO PACK: 17.0000 g | PACK | Freq: Every day | ORAL | Status: DC

## 2023-07-01 MED ORDER — POLYVINYL ALCOHOL 1.4 % OP SOLN: 1.0000 [drp] | Freq: Four times a day (QID) | OPHTHALMIC | Status: DC | PRN

## 2023-07-01 MED ORDER — DIPHENHYDRAMINE HCL 50 MG/ML IJ SOLN
25.0000 mg | INTRAMUSCULAR | Status: DC | PRN
  Administered 2023-07-01: 25 mg via INTRAVENOUS
  Filled 2023-07-01: qty 1

## 2023-07-01 MED ORDER — PROPOFOL 1000 MG/100ML IV EMUL
0.0000 ug/kg/min | INTRAVENOUS | Status: DC
  Administered 2023-07-01: 5 ug/kg/min via INTRAVENOUS
  Filled 2023-07-01: qty 100

## 2023-07-01 MED ORDER — MIDAZOLAM HCL 2 MG/2ML IJ SOLN
2.0000 mg | INTRAMUSCULAR | Status: DC | PRN
  Administered 2023-07-01: 4 mg via INTRAVENOUS
  Filled 2023-07-01: qty 4

## 2023-07-01 MED ORDER — SODIUM BICARBONATE 8.4 % IV SOLN
INTRAVENOUS | Status: DC
  Filled 2023-07-01: qty 1000

## 2023-07-01 MED ORDER — DEXTROSE 50 % IV SOLN
25.0000 g | INTRAVENOUS | Status: AC
  Administered 2023-07-01: 25 g via INTRAVENOUS
  Filled 2023-07-01: qty 50

## 2023-07-01 MED ORDER — GLYCOPYRROLATE 1 MG PO TABS: 1.0000 mg | ORAL_TABLET | ORAL | Status: DC | PRN

## 2023-07-01 MED ORDER — DEXTROSE 50 % IV SOLN
12.5000 g | INTRAVENOUS | Status: AC
  Administered 2023-07-01: 12.5 g via INTRAVENOUS
  Filled 2023-07-01: qty 50

## 2023-07-01 MED ORDER — CALCIUM GLUCONATE-NACL 2-0.675 GM/100ML-% IV SOLN
2.0000 g | Freq: Once | INTRAVENOUS | Status: DC
  Filled 2023-07-01: qty 100

## 2023-07-01 MED ORDER — ONDANSETRON 4 MG PO TBDP: 4.0000 mg | ORAL_TABLET | Freq: Four times a day (QID) | ORAL | Status: DC | PRN

## 2023-07-01 MED ORDER — SODIUM ZIRCONIUM CYCLOSILICATE 10 G PO PACK
10.0000 g | PACK | Freq: Once | ORAL | Status: DC
  Filled 2023-07-01: qty 1

## 2023-07-01 MED ORDER — DEXTROSE 50 % IV SOLN
INTRAVENOUS | Status: AC
  Administered 2023-07-01: 1
  Filled 2023-07-01: qty 50

## 2023-07-01 MED ORDER — SODIUM CHLORIDE 0.9 % IV SOLN
250.0000 mL | INTRAVENOUS | Status: DC
  Administered 2023-07-01: 250 mL via INTRAVENOUS

## 2023-07-02 ENCOUNTER — Ambulatory Visit (HOSPITAL_COMMUNITY)

## 2023-07-05 ENCOUNTER — Ambulatory Visit (HOSPITAL_COMMUNITY)

## 2023-07-07 ENCOUNTER — Ambulatory Visit (HOSPITAL_COMMUNITY)

## 2023-07-09 ENCOUNTER — Ambulatory Visit (HOSPITAL_COMMUNITY)

## 2023-07-12 ENCOUNTER — Ambulatory Visit (HOSPITAL_COMMUNITY)

## 2023-07-14 ENCOUNTER — Ambulatory Visit (HOSPITAL_COMMUNITY)

## 2023-07-16 ENCOUNTER — Ambulatory Visit (HOSPITAL_COMMUNITY)

## 2023-07-19 ENCOUNTER — Ambulatory Visit (HOSPITAL_COMMUNITY)

## 2023-07-21 ENCOUNTER — Ambulatory Visit (HOSPITAL_COMMUNITY)

## 2023-07-23 ENCOUNTER — Ambulatory Visit (HOSPITAL_COMMUNITY)

## 2023-07-26 ENCOUNTER — Ambulatory Visit (HOSPITAL_COMMUNITY)

## 2023-07-28 ENCOUNTER — Ambulatory Visit (HOSPITAL_COMMUNITY)

## 2023-07-29 NOTE — Progress Notes (Signed)
 Chaplain responds to code blue and offers compassionate presence as medical team cares for pt until he is moved to ICU. Family has yet to arrive. Will refer to oncoming chaplain for further support.

## 2023-07-29 NOTE — Consult Note (Signed)
 NAME:  Robert Horn, MRN:  [id number], DOB:  12/12/1933, LOS: 2 ADMISSION DATE:  06/28/2023, CONSULTATION DATE:   REFERRING MD:  TRH, CHIEF COMPLAINT:  acute hypoxic resp failure   History of Present Illness:  89 yo male presented to hospital on 06/28/23 with shortness of breath and generalized weakness. Found to have AKI on ckd3b and was admitted. Pt had tunneled catheter placed and HD started 4/2.   Pt was found to also be encephalopathic at presentation, intermittently agitated. His echo revealed biv heart failure. He had been unable to take his PO medications during this stay 2/2 encephalopathy, had diminished uop as well.   This morning pt was noted to have rhonchi, rrt was called and cxr completed with ?developing pna. He was req 2L [place], hemodynamically stable. However, coox reduced and lactate elevated. As the morning progressed he became more labored in his breathing, lactate >9 , bicarb <10 had resp arrest-> brady'd and given atropine, intubated, started on levo and transferred to ICU.   Code team responded and d/w family who has alerted them that pt would not desire heroic measures and has requested DNR.   Ccm was consulted for transfer. At the time of my evaluation pt was unresponsive on vent on sedation (previously following commands). On 5 of norepi, fentanyl and propofol.   Extensive conversation had with wife and daughter. They have stated pt should be dnr. They would like to do what they can to improve him but have made it clear that he does not undergo more w/u or procedures should he not have ability to return to quality of life.  Pertinent  Medical History  NICM HFrEF Ckd3b T2dm Afib on chronic a/c Htn Hyperlipidemia Pad s/p R TMA AoStenosis s/o tavr  Significant Hospital Events: Including procedures, antibiotic start and stop dates in addition to other pertinent events   Admitted to floor 06/28/23 Resp arrest and intubation, moved to icu  Interim History  / Subjective:    Objective   Blood pressure (!) 115/90, pulse (!) 29, temperature (!) 97.5 F (36.4 C), temperature source Rectal, resp. rate (!) 24, height 6' (1.829 m), weight 97.9 kg, SpO2 (!) 85%.    Vent Mode: PRVC FiO2 (%):  [100 %] 100 % Set Rate:  [15 bmp] 15 bmp Vt Set:  [657 mL] 620 mL PEEP:  [5 cmH20] 5 cmH20   Intake/Output Summary (Last 24 hours) at  0601 Last data filed at  0539 Gross per 24 hour  Intake 559.49 ml  Output 0 ml  Net 559.49 ml   Filed Weights   06/30/23 1644 06/30/23 1651 06/30/23 2006  Weight: 97.9 kg 97.9 kg 97.9 kg    Examination: General: appears acutely and chronically ill, intubated and sedated HENT: ncat pupils pinppoint and sluggish, arcus senilis, anicteric, poor dentition Lungs: rhonchi bilaterally, barrel chest wall Cardiovascular: irreg irreg Abdomen: nt, nd bs diminished Extremities: no c/c/e, R foot tma Neuro: unresponsive on sedation on vent GU: deferred  Resolved Hospital Problem list     Assessment & Plan:  Ihca s/p rosc Hyperkalemia Acute hypoxic resp failure Metabolic encephalopathy Shock, suspected cardiogenic at this time transaminitis Afib on chronic a/c Hfref Ckd3b now req dialysis AoS s/p TAVR 2018 PAD s/p R le TMA T2dm with hyperglycemia H/o htn Hyperlipidemia H/o cva -cont d/w family re: goc -titrate vasopressors to map >65 for now -titrate vent, sat/sbt with clinically appropriate -vap prevention -check coox in am -temporize K, will add lokelma at tis time as well -  resume heparin gtt when able -nephro to see in am, already as tunneled vas cath in place -cards on board and HF to see in am per their notes.  -ssi   Best Practice (right click and "Reselect all SmartList Selections" daily)   Diet/type: NPO w/ oral meds DVT prophylaxis systemic heparin Pressure ulcer(s): pressure ulcer assessment deferred  GI prophylaxis: H2B Lines: Dialysis Catheter Foley:  Yes, and it is still  needed Code Status:  DNR Last date of multidisciplinary goals of care discussion [pending. Code team spoke with family who has requested dnr]  Labs   CBC: Recent Labs  Lab 06/28/23 1034 06/29/23 0548 06/30/23 0502  0315  WBC 8.3 8.0 8.3 9.8  NEUTROABS  --   --   --  8.9*  HGB 10.7* 10.5* 10.5* 10.9*  HCT 33.4* 32.8* 32.2* 33.8*  MCV 94.4 92.7 92.0 93.4  PLT PLATELET CLUMPS NOTED ON SMEAR, UNABLE TO ESTIMATE PLATELET CLUMPS NOTED ON SMEAR, UNABLE TO ESTIMATE PLATELET CLUMPS NOTED ON SMEAR, UNABLE TO ESTIMATE PLATELET CLUMPS NOTED ON SMEAR, UNABLE TO ESTIMATE    Basic Metabolic Panel: Recent Labs  Lab 06/28/23 1034 06/28/23 1228 06/29/23 0548 06/30/23 0502  0315  NA 138  --  138 137 135  K 5.2*  --  5.3* 5.1 6.0*  CL 109  --  107 105 98  CO2 13*  --  15* 15* 10*  GLUCOSE 127*  --  119* 137* 60*  BUN 109*  --  123* 135* 111*  CREATININE 3.80*  --  4.26* 5.25* 5.14*  CALCIUM 9.1  --  8.6* 7.5* 6.8*  MG  --  3.5*  --   --  3.7*  PHOS  --  7.5*  --   --  10.7*   GFR: Estimated Creatinine Clearance: 12.3 mL/min (A) (by C-G formula based on SCr of 5.14 mg/dL (H)). Recent Labs  Lab 06/28/23 1034 06/28/23 2232 06/29/23 0548 06/30/23 0502 06/30/23 2230  0315  PROCALCITON  --   --   --   --   --  0.98  WBC 8.3  --  8.0 8.3  --  9.8  LATICACIDVEN  --  2.9* 2.8*  --  5.3* >9.0*    Liver Function Tests: Recent Labs  Lab  0315  AST 2,263*  ALT 961*  ALKPHOS 177*  BILITOT 6.6*  PROT 6.6  ALBUMIN 3.1*   No results for input(s): "LIPASE", "AMYLASE" in the last 168 hours. No results for input(s): "AMMONIA" in the last 168 hours.  ABG    Component Value Date/Time   PHART 7.33 (L)  0334   PCO2ART <18 (LL)  0334   PO2ART 276 (H)  0334   HCO3 8.6 (L)  0334   TCO2 19 (L) 06/16/2023 1142   ACIDBASEDEF 15.1 (H)  0334   O2SAT 100  0334     Coagulation Profile: No results  for input(s): "INR", "PROTIME" in the last 168 hours.  Cardiac Enzymes: No results for input(s): "CKTOTAL", "CKMB", "CKMBINDEX", "TROPONINI" in the last 168 hours.  HbA1C: Hgb A1c MFr Bld  Date/Time Value Ref Range Status  06/17/2023 03:58 AM 6.7 (H) 4.8 - 5.6 % Final    Comment:    (NOTE) Pre diabetes:          5.7%-6.4%  Diabetes:              >6.4%  Glycemic control for   <7.0% adults with diabetes   03/10/2023 02:42 AM 6.4 (H) 4.8 -  5.6 % Final    Comment:    (NOTE) Pre diabetes:          5.7%-6.4%  Diabetes:              >6.4%  Glycemic control for   <7.0% adults with diabetes     CBG: Recent Labs  Lab 06/30/23 0748 06/30/23 1212  0411  0414  0434  GLUCAP 121* 117* 23* 30* 106*    Review of Systems:   Unobtainable 2/2 pt's intubated sedated state  Past Medical History:  He,  has a past medical history of Anemia, Anxiety, Asthma, Cancer (HCC) (2019), CHF (congestive heart failure) (HCC), Chronic kidney disease, Coronary artery disease, Diabetes mellitus without complication (HCC), Dyslipidemia (10/27/2015), Dyspnea, Elevated PSA (10/27/2015), Erectile dysfunction (10/27/2015), Heart murmur, Hypertension (10/27/2015), Hypogonadism male (10/27/2015), Neuropathy, Obesity (10/27/2015), Peripheral vascular disease (HCC), Pneumonia (10/27/2015), and Rotator cuff tear (10/27/2015).   Surgical History:   Past Surgical History:  Procedure Laterality Date   ABDOMINAL AORTOGRAM N/A 02/01/2019   Procedure: ABDOMINAL AORTOGRAM;  Surgeon: Cephus Shelling, MD;  Location: [facility] INVASIVE CV LAB;  Service: Cardiovascular;  Laterality: N/A;   AMPUTATION Right 02/03/2019   Procedure: RIGHT FOOT 1ST RAY AMPUTATION;  Surgeon: Nadara Mustard, MD;  [facility];  Service: Orthopedics;  Laterality: Right;   AMPUTATION Right 08/02/2019   Procedure: RIGHT 2ND TOE AMPUTATION;  Surgeon: Nadara Mustard, MD;  Location: [facility] OR;  Service: Orthopedics;  Laterality:  Right;   AMPUTATION Right 11/08/2020   Procedure: RIGHT TRANSMETATARSAL AMPUTATION;  Surgeon: Nadara Mustard, MD;  Location: [facility] OR;  Service: Orthopedics;  Laterality: Right;   APPLICATION OF WOUND VAC  11/08/2020   Procedure: APPLICATION OF WOUND VAC;  Surgeon: Nadara Mustard, MD;  Location: [facility] OR;  Service: Orthopedics;;   CARDIAC CATHETERIZATION N/A 11/14/2015   Procedure: Left Heart Cath and Coronary Angiography;  Surgeon: Lyn Records, MD;  Location: [facility] INVASIVE CV LAB;  Service: Cardiovascular;  Laterality: N/A;   COLONOSCOPY     IR FLUORO GUIDE CV LINE RIGHT  06/30/2023   IR US GUIDE VASC ACCESS RIGHT  06/30/2023   LAPAROSCOPIC PARTIAL COLECTOMY  12/15/2017   LAPAROSCOPIC PARTIAL COLECTOMY (N/A Abdomen)   LAPAROSCOPIC PARTIAL COLECTOMY N/A 12/15/2017   Procedure: LAPAROSCOPIC PARTIAL COLECTOMY;  Surgeon: Harriette Bouillon, MD;  Location: [facility] OR;  Service: General;  Laterality: N/A;   LOWER EXTREMITY ANGIOGRAPHY Right 02/01/2019   Procedure: LOWER EXTREMITY ANGIOGRAPHY;  Surgeon: Cephus Shelling, MD;  Location: [facility];  Service: Cardiovascular;  Laterality: Right;   PERIPHERAL VASCULAR INTERVENTION Right 02/01/2019   Procedure: PERIPHERAL VASCULAR INTERVENTION;  Surgeon: Cephus Shelling, MD;  Location: [facility];  Service: Cardiovascular;  Laterality: Right;  SFA   RIGHT HEART CATH AND CORONARY ANGIOGRAPHY N/A 03/11/2023   Procedure: RIGHT HEART CATH AND CORONARY ANGIOGRAPHY;  Surgeon: Kathleene Hazel, MD;  Location: [facility] CV LAB;  Service: Cardiovascular;  Laterality: N/A;   TEE WITHOUT CARDIOVERSION N/A 12/15/2016   Procedure: TRANSESOPHAGEAL ECHOCARDIOGRAM (TEE);  Surgeon: Tonny Bollman, MD;  Location: [facility] OR;  Service: Open Heart Surgery;  Laterality: N/A;   TRANSCATHETER AORTIC VALVE REPLACEMENT, TRANSFEMORAL N/A 12/15/2016   Procedure: TRANSCATHETER AORTIC VALVE REPLACEMENT, TRANSFEMORAL;  Surgeon: Tonny Bollman, MD;  Location: [facility] OR;  Service:  Open Heart Surgery;  Laterality: N/A;     Social History:   reports that he has quit smoking. His smoking use included cigarettes. He has never been exposed to  tobacco smoke. He has never used smokeless tobacco. He reports current alcohol use. He reports that he does not use drugs.   Family History:  His family history includes Diabetes in his mother; Heart disease in his mother; Pulmonary embolism in his father.   Allergies Allergies  Allergen Reactions   Ticagrelor Other (See Comments)    Unknown      Home Medications  Prior to Admission medications   Medication Sig Start Date End Date Taking? Authorizing Provider  apixaban (ELIQUIS) 2.5 MG TABS tablet Take 1 tablet (2.5 mg total) by mouth 2 (two) times daily. 06/19/23  Yes Mathews Argyle, NP  benzonatate (TESSALON) 100 MG capsule Take 100 mg by mouth 3 (three) times daily as needed for cough. 06/11/23  Yes [provider]  Cholecalciferol (VITAMIN D) 50 MCG (2000 UT) tablet Take 2,000 Units by mouth daily.   Yes [provider]  diclofenac Sodium (VOLTAREN) 1 % GEL Apply 1 Application topically as needed (pain). 03/03/22  Yes [provider]  ezetimibe (ZETIA) 10 MG tablet Take 1 tablet (10 mg total) by mouth daily. 05/31/23  Yes Chilton Si, MD  JARDIANCE 25 MG TABS tablet Take 25 mg by mouth daily. 12/06/19  Yes [provider]  metoprolol succinate (TOPROL-XL) 25 MG 24 hr tablet Take 1 tablet (25 mg total) by mouth daily. Patient taking differently: Take 50 mg by mouth daily. 06/20/23  Yes Mathews Argyle, NP  midodrine (PROAMATINE) 2.5 MG tablet Take 1 tablet (2.5 mg total) by mouth 2 (two) times daily with a meal. 06/19/23  Yes Mathews Argyle, NP  rosuvastatin (CRESTOR) 40 MG tablet Take 1 tablet (40 mg total) by mouth daily. Patient taking differently: Take 40 mg by mouth every evening. 05/15/21  Yes McCue, Shanda Bumps, NP  VITAMIN B COMPLEX-C PO Take 1 tablet by mouth daily.   Yes [provider]  furosemide (LASIX) 40 MG tablet Take 40 mg by mouth daily. Patient not taking: Reported on 06/28/2023    [provider]  furosemide (LASIX) 80 MG tablet Take 40 mg by mouth every other day. Alternating with 40mg  tablet Patient not taking: Reported on 06/28/2023 03/12/23   [provider]  OZEMPIC, 0.25 OR 0.5 MG/DOSE, 2 MG/3ML SOPN Inject 0.25 mg into the skin once a week. Patient not taking: Reported on 06/28/2023 05/09/23   [provider]     Critical care time: 

## 2023-07-29 NOTE — Death Summary Note (Addendum)
 DEATH SUMMARY   Patient Details  Name: Robert Horn MRN: [id number] DOB: [birth year]  Admission/Discharge Information   Admit Date:  2023-07-23  Date of Death: Date of Death: 07-26-23  Time of Death: Time of Death: 0950  Length of Stay: 2  Referring Physician: Renford Dills, MD   Reason(s) for Hospitalization  Shortness of breath -- AKI on CKD, heart failure  Diagnoses  Preliminary cause of death: Decompensated Biventricular heart failure with cardiogenic shock  Secondary Diagnoses (including complications and co-morbidities):   Goals of care discussion DNR status Encounter for palliative care  Respiratory arrest Acute metabolic encephalopathy Acute respiratory failure with hypoxia AKI on CKD 3b with uremia Low-flow state heart failure, chronic Biventricular failure Cardiogenic shock NICM Atrial fibrillation Chronic anticoagulation  Elevated troponin Hyperkalemia Hyperphosphatemia Hypermagnesemia Hypocalcemia AGMA Lactic acidosis  Elevated LFTs, shock liver Hyperbilirubinemia  AS s/p TAVR PAD s/p right lower extremity TMA Anemia  DM2  Hypoglycemia  HTN HLD CVA  Colon cancer s/p colectomy    Brief Hospital Course (including significant findings, care, treatment, and services provided and events leading to death)  Robert Horn is a 89 y.o. year old male who was admitted to the hospital 07/23/23 after presenting with shortness of breath and generalized weakness. Had recently been hospitalized 3/19-3/24/25 for bilateral MCA CVAs, and newly diagnosed Afib. This admission was found to had AKI superimposed on his chronic kidney disease, and was in Afib with RVR. Nephrology and cardiology were consulted, respectively. He had poor renal function, decreasing UOP, and progressive AMS with associated uremia, and was started on HD 4/2. Cardiology was concerned for low-flow state despite adequate pressures and a coox was obtained 4/2 resulting  very low at 36.  Overnight  4/2-4/3 the patient declined, having a new lactic acidosis, worsening hypoxia, AMS and RRT was called. The patient further decompensated becoming bradycardic and agonal, was given atropine and intubated, started on pressors and transferred to the ICU. He was made DNR following these events.    On 2023/07/26 day the patient had worsening lactic acidosis and metabolic acidosis despite temporizing efforts. His shock worsened. Evidence of multisystem organ failure. Goals of care were discussed and a decision was reached to transition to comfort care.  The patient was compassionately extubated and died peacefully 2023-07-26 at 0950, with family at the bedside.     Pertinent Labs and Studies  Significant Diagnostic Studies DG Abd 1 View Result Date: 07/26/2023 CLINICAL DATA:  NG placement. EXAM: ABDOMEN - 1 VIEW COMPARISON:  Chest radiograph dated 07-26-23. FINDINGS: Partially visualized enteric tube with side-port in the distal esophagus and tip just distal to the GE junction. Recommend further advancing by additional 7-8 cm. IMPRESSION: Enteric tube with tip just distal to the GE junction. Recommend further advancing by additional 7-8 cm. Electronically Signed   By: Elgie Collard M.D.   On: 2023/07/26 11:18   DG Chest 1 View Result Date: July 26, 2023 CLINICAL DATA:  Status post intubation. EXAM: CHEST  1 VIEW COMPARISON:  Earlier same day FINDINGS: Endotracheal tube tip is positioned at the carina, directed towards the right mainstem bronchus. This could be retracted approximately 3 cm to place the tip in the distal trachea. NG tube is not well visualized over the lobe mediastinum but can be seen to extend is for is the distal esophagus. Right IJ central line tip overlies the proximal to mid SVC level. Lung volumes are low. Retrocardiac left base atelectasis again noted. Patient is status post TAVR. Telemetry leads overlie the chest. IMPRESSION:  1. Endotracheal tube tip is positioned at the carina, directed towards  the right mainstem bronchus. This could be retracted approximately 3 cm to place the tip in the distal esophagus. 2. NG tube tip is not well visualized but can be seen to extend is for is the distal esophagus. Abdominal x-ray could be used to confirm NG tube tip placement. 3. Low lung volumes with retrocardiac left base atelectasis. These results will be called to the ordering clinician or representative by the Radiologist Assistant, and communication documented in the PACS or [employer]. Electronically Signed   By: Kennith Center M.D.   On:  06:22   DG Chest Port 1 View Result Date:  CLINICAL DATA:  Shortness of breath EXAM: PORTABLE CHEST 1 VIEW COMPARISON:  06/28/2023 FINDINGS: Stable cardiomegaly. Aortic atherosclerotic calcification. TAVR. Right IJ CVC tip in the mid SVC. Retrocardiac atelectasis or pneumonia. Right lung is clear. No pleural effusion or pneumothorax. IMPRESSION: Retrocardiac atelectasis or pneumonia.  Cardiomegaly. Electronically Signed   By: Minerva Fester M.D.   On:  03:00   IR Fluoro Guide CV Line Right Result Date: 06/30/2023 INDICATION: 89 year old male referred for temporary hemodialysis catheter EXAM: IMAGE GUIDED TEMPORARY HEMODIALYSIS CATHETER MEDICATIONS: None ANESTHESIA/SEDATION: None FLUOROSCOPY: Radiation Exposure Index (as provided by the fluoroscopic device): 0.5 mGy Kerma COMPLICATIONS: None PROCEDURE: Informed written consent was obtained from the patient's family after a discussion of the risks, benefits, and alternatives to treatment. Questions regarding the procedure were encouraged and answered. The right neck was prepped with chlorhexidine in a sterile fashion, and a sterile drape was applied covering the operative field. Maximum barrier sterile technique with sterile gowns and gloves were used for the procedure. A timeout was performed prior to the initiation of the procedure. A micropuncture kit was utilized to access the right  internal jugular vein under direct, real-time ultrasound guidance after the overlying soft tissues were anesthetized with 1% lidocaine with epinephrine. Ultrasound image documentation was performed. The microwire was kinked to measure appropriate catheter length. A stiff glidewire was advanced to the level of the IVC. A 16 cm hemodialysis catheter was then placed over the wire. Final catheter positioning was confirmed and documented with a spot radiographic image. The catheter aspirates and flushes normally. The catheter was flushed with appropriate volume heparin dwells. Dressings were applied. The patient tolerated the procedure well without immediate post procedural complication. IMPRESSION: Status post image guided temporary right IJ hemodialysis catheter Signed, Yvone Neu. Miachel Roux, RPVI [facility] Radiology Electronically Signed   By: Gilmer Mor D.O.   On: 06/30/2023 14:04   IR US Guide Vasc Access Right Result Date: 06/30/2023 INDICATION: 89 year old male referred for temporary hemodialysis catheter EXAM: IMAGE GUIDED TEMPORARY HEMODIALYSIS CATHETER MEDICATIONS: None ANESTHESIA/SEDATION: None FLUOROSCOPY: Radiation Exposure Index (as provided by the fluoroscopic device): 0.5 mGy Kerma COMPLICATIONS: None PROCEDURE: Informed written consent was obtained from the patient's family after a discussion of the risks, benefits, and alternatives to treatment. Questions regarding the procedure were encouraged and answered. The right neck was prepped with chlorhexidine in a sterile fashion, and a sterile drape was applied covering the operative field. Maximum barrier sterile technique with sterile gowns and gloves were used for the procedure. A timeout was performed prior to the initiation of the procedure. A micropuncture kit was utilized to access the right internal jugular vein under direct, real-time ultrasound guidance after the overlying soft tissues  were anesthetized with 1% lidocaine with epinephrine. Ultrasound image documentation was performed.  The microwire was kinked to measure appropriate catheter length. A stiff glidewire was advanced to the level of the IVC. A 16 cm hemodialysis catheter was then placed over the wire. Final catheter positioning was confirmed and documented with a spot radiographic image. The catheter aspirates and flushes normally. The catheter was flushed with appropriate volume heparin dwells. Dressings were applied. The patient tolerated the procedure well without immediate post procedural complication. IMPRESSION: Status post image guided temporary right IJ hemodialysis catheter Signed, Yvone Neu. Miachel Roux, RPVI [facility] Radiology Electronically Signed   By: Gilmer Mor D.O.   On: 06/30/2023 14:04   US RENAL Result Date: 06/28/2023 CLINICAL DATA:  Acute kidney injury EXAM: RENAL / URINARY TRACT ULTRASOUND COMPLETE COMPARISON:  CT 12/10/2017 FINDINGS: Right Kidney: Renal measurements: 9.5 x 6.1 x 5.3 cm = volume: 161.5 mL. No collecting system dilatation perinephric fluid. Multiple simple cysts are identified as seen on previous examination. These includes central focus measuring 2.7 x 2.9 x 2.7 cm and lower pole focus measuring 3.9 x 3.0 x 3.9 cm. Left Kidney: Renal measurements: 10.0 x 6.3 x 5.3 cm = volume: 176.6 mL. No collecting system dilatation perinephric fluid. Simple appearing cysts are seen including measuring 2.6 x 2.8 x 2.7 cm Bladder: Underdistended. Other: Prostate poorly defined but felt to be enlarged. Tiny left pleural effusion IMPRESSION: No collecting system dilatation. Bilateral renal cysts are identified. These were seen on previous CT. Enlarged prostate but poorly defined on this examination. Tiny left pleural effusion Electronically Signed   By: Karen Kays M.D.   On: 06/28/2023 15:53   DG Chest 2 View Result Date: 06/28/2023 CLINICAL DATA:   Chest pain. EXAM: CHEST - 2 VIEW COMPARISON:  Chest radiograph dated 03/10/2023. FINDINGS: Shallow inspiration with left lung base atelectasis. No pleural effusion or pneumothorax. Mild cardiomegaly. Aortic valve repair. Atherosclerotic calcification of the aorta. No acute osseous pathology. IMPRESSION: 1. Shallow inspiration with left lung base atelectasis. 2. Mild cardiomegaly. Electronically Signed   By: Elgie Collard M.D.   On: 06/28/2023 11:54   ECHOCARDIOGRAM COMPLETE Result Date: 06/17/2023    ECHOCARDIOGRAM REPORT   Patient Name:   Lucion Dilger Date of Exam: 06/17/2023 Medical Rec #:  [id number]     Height:       72.0 in Accession #:    [id number]    Weight:       217.4 lb Date of Birth:  30-Nov-1933     BSA:          2.207 m Patient Age:    89 years      BP:           99/69 mmHg Patient Gender: M             HR:           100 bpm. Exam Location:  Inpatient Procedure: 2D Echo, Cardiac Doppler, Color Doppler and Intracardiac            Opacification Agent (Both Spectral and Color Flow Doppler were            utilized during procedure). Indications:    Stroke  History:        Patient has prior history of Echocardiogram examinations, most                 recent 03/09/2023. TIA, Aortic Valve Disease; Risk                 Factors:Hypertension.  Sonographer:    Amy Chionchio Referring Phys: Marvel Plan IMPRESSIONS  1. Left ventricular ejection fraction, by estimation, is 25 to 30%. The left ventricle has severely decreased function. The left ventricle demonstrates global hypokinesis. The left ventricular internal cavity size was mildly dilated. Left ventricular diastolic function could not be evaluated.  2. Right ventricular systolic function reduced. The right ventricular size is normal.  3. Left atrial size was severely dilated.  4. Right atrial size was moderately dilated.  5. A small pericardial effusion is present. The pericardial effusion is posterior to the left ventricle. There is no evidence of  cardiac tamponade.  6. The mitral valve is grossly normal. Mild mitral valve regurgitation. No evidence of mitral stenosis.  7. Tricuspid valve regurgitation is mild to moderate.  8. S/p 26mm Edwards Sapien TAVR (implanted 12/15/2016), well seated, no perivalvular leak, no aortic regurgitation, low flow-low gradient aortic stenosis cannot be ruled out (peak velocity, 2.64m/s, PG , MG , AVA VTI 1.1cm2, DI 0.32, Accleration time , SVi 16).  9. Ascending aorta is structurally normal, with no evidence of dilitation. Comparison(s): A prior study was performed on 03/09/2023. No significant change from prior study. Conclusion(s)/Recommendation(s): No intracardiac source of embolism detected on this transthoracic study. Consider a transesophageal echocardiogram to exclude cardiac source of embolism if clinically indicated. No left ventricular mural or apical thrombus/thrombi. FINDINGS  Left Ventricle: Left ventricular ejection fraction, by estimation, is 25 to 30%. The left ventricle has severely decreased function. The left ventricle demonstrates global hypokinesis. Definity contrast agent was given IV to delineate the left ventricular endocardial borders. The left ventricular internal cavity size was mildly dilated. There is no left ventricular hypertrophy. Left ventricular diastolic function could not be evaluated due to atrial fibrillation. Left ventricular diastolic function could not be evaluated. Right Ventricle: The right ventricular size is normal. No increase in right ventricular wall thickness. Right ventricular systolic function reduced. Left Atrium: Left atrial size was severely dilated. Right Atrium: Right atrial size was moderately dilated. Pericardium: A small pericardial effusion is present. The pericardial effusion is posterior to the left ventricle. There is no evidence of cardiac tamponade. Mitral Valve: The mitral valve is grossly normal. Mild mitral valve regurgitation. No evidence of  mitral valve stenosis. MV peak gradient, 4.7 mmHg. The mean mitral valve gradient is 2.0 mmHg. Tricuspid Valve: The tricuspid valve is normal in structure. Tricuspid valve regurgitation is mild to moderate. No evidence of tricuspid stenosis. Aortic Valve: S/p 26mm Edwards Sapien TAVR (implanted 12/15/2016), well seated, no perivalvular leak, no aortic regurgitation, low flow-low gradient aortic stenosis cannot be ruled out (peak velocity, 2.27m/s, PG , MG , AVA VTI 1.1cm2, DI 0.32, Accleration time , SVi 16). Aortic valve mean gradient measures 12.0 mmHg. Aortic valve peak gradient measures 20.1 mmHg. Aortic valve area, by VTI measures 1.10 cm. There is a bioprosthetic valve present in the aortic position. Pulmonic Valve: The pulmonic valve was normal in structure. Pulmonic valve regurgitation is trivial. No evidence of pulmonic stenosis. Aorta: Ascending aorta is structurally normal, with no evidence of dilitation. The aortic root was not well visualized. IAS/Shunts: There is right bowing of the interatrial septum, suggestive of elevated left atrial pressure. The atrial septum is grossly normal.  LEFT VENTRICLE PLAX 2D LVIDd:         6.10 cm LVIDs:         5.70 cm LV PW:         0.80 cm LV IVS:  0.80 cm LVOT diam:     2.10 cm LV SV:         35 LV SV Index:   16 LVOT Area:     3.46 cm  LV Volumes (MOD) LV vol d, MOD A2C: 190.0 ml LV vol d, MOD A4C: 218.0 ml LV vol s, MOD A2C: 164.0 ml LV vol s, MOD A4C: 105.0 ml LV SV MOD A2C:     26.0 ml LV SV MOD A4C:     218.0 ml LV SV MOD BP:      70.5 ml RIGHT VENTRICLE          IVC RV Basal diam:  4.10 cm  IVC diam: 2.10 cm RV Mid diam:    2.60 cm TAPSE (M-mode): 1.1 cm LEFT ATRIUM              Index        RIGHT ATRIUM           Index LA Vol (A2C):   174.0 ml 78.83 ml/m  RA Area:     26.90 cm LA Vol (A4C):   86.3 ml  39.10 ml/m  RA Volume:   93.90 ml  42.54 ml/m LA Biplane Vol: 125.0 ml 56.63 ml/m  AORTIC VALVE                     PULMONIC VALVE  AV Area (Vmax):    1.00 cm      PV Vmax:       1.03 m/s AV Area (Vmean):   0.99 cm      PV Peak grad:  4.2 mmHg AV Area (VTI):     1.10 cm AV Vmax:           224.00 cm/s AV Vmean:          159.400 cm/s AV VTI:            0.317 m AV Peak Grad:      20.1 mmHg AV Mean Grad:      12.0 mmHg LVOT Vmax:         64.35 cm/s LVOT Vmean:        45.550 cm/s LVOT VTI:          0.101 m LVOT/AV VTI ratio: 0.32  AORTA Ao Asc diam: 3.30 cm MITRAL VALVE MV Area (PHT): 2.02 cm   SHUNTS MV Area VTI:   1.60 cm   Systemic VTI:  0.10 m MV Peak grad:  4.7 mmHg   Systemic Diam: 2.10 cm MV Mean grad:  2.0 mmHg MV Vmax:       1.08 m/s MV Vmean:      63.3 cm/s Sunit Tolia Electronically signed by Tessa Lerner Signature Date/Time: 06/17/2023/5:25:42 PM    Final    CT HEAD WO CONTRAST ( ) Result Date: 06/17/2023 CLINICAL DATA:  Stroke, follow-up.  24 hour post TNK. EXAM: CT HEAD WITHOUT CONTRAST TECHNIQUE: Contiguous axial images were obtained from the base of the skull through the vertex without intravenous contrast. RADIATION DOSE REDUCTION: This exam was performed according to the departmental dose-optimization program which includes automated exposure control, adjustment of the mA and/or kV according to patient size and/or use of iterative reconstruction technique. COMPARISON:  MR head without contrast 06/17/2023. CT head without contrast 06/16/2023. FINDINGS: Brain: The acute nonhemorrhagic infarct involving the left frontal operculum and anterior insular cortex is stable. No significant expansion of the infarct is present. Moderate atrophy and diffuse white matter disease is stable. Remote  lacunar infarcts are present in the left lentiform nucleus right caudate head. The ventricles are proportionate to the degree of atrophy. No significant extraaxial fluid collection is present. The brainstem and cerebellum are within normal limits. Midline structures are within normal limits. Vascular: Atherosclerotic calcifications are present  within the cavernous internal carotid arteries bilaterally. No hyperdense vessel is present. Skull: Calvarium is intact. No focal lytic or blastic lesions are present. No significant extracranial soft tissue lesion is present. Sinuses/Orbits: The paranasal sinuses and mastoid air cells are clear. The globes and orbits are within normal limits. IMPRESSION: 1. Stable acute nonhemorrhagic infarct involving the left frontal operculum and anterior insular cortex. 2. Stable moderate atrophy and diffuse white matter disease. This likely reflects the sequela of chronic microvascular ischemia. 3. Remote lacunar infarcts of the left lentiform nucleus and right caudate head. Electronically Signed   By: Marin Roberts M.D.   On: 06/17/2023 15:50   MR BRAIN WO CONTRAST Result Date: 06/17/2023 CLINICAL DATA:  89 year old male code stroke presentation. Fall. Intracranial atherosclerosis. EXAM: MRI HEAD WITHOUT CONTRAST TECHNIQUE: Multiplanar, multiecho pulse sequences of the brain and surrounding structures were obtained without intravenous contrast. COMPARISON:  CT head, CTA head and neck yesterday. Brain MRI 03/16/2021. FINDINGS: Brain: Cortical, patchy and confluent restricted diffusion in the anterior or middle left MCA division territory. Anterior insula and operculum are affected, in an area of about 5 cm (series 7, image 61). T2 and FLAIR hyperintense cytotoxic edema. No hemorrhagic transformation or mass effect. Contralateral small and solitary appearing cortical area of restricted diffusion in the inferior right parietal lobe series 5, image 82. Minor T2 and FLAIR hyperintensity with no hemorrhage or mass effect. No deep gray nuclei or posterior fossa involvement. Underlying advanced chronic cerebral white matter disease with deep white matter capsule involvement more pronounced in the left corona radiata. Chronic right caudate lacunar infarct. And several small chronic cerebellar lacunar infarcts are new or  increased since 2022 (series 10 image 7). No significant chronic cerebral blood products. No midline shift, mass effect, evidence of mass lesion, ventriculomegaly, extra-axial collection or acute intracranial hemorrhage. Cervicomedullary junction and pituitary are within normal limits. Vascular: Major intracranial vascular flow voids are stable since 2022. Skull and upper cervical spine: Partially visible chronically advanced cervical spine degeneration. Skull bone marrow signal is normal. Sinuses/Orbits: Negative. Other: Mastoids are clear. Visible internal auditory structures appear normal. Negative visible scalp and face. IMPRESSION: 1. Acute infarct in the Left MCA anterior versus middle division, patchy involvement of a roughly 5 cm area of the anterior insula and operculum. 2. Superimposed small acute cortical infarct also in the contralateral Right parietal lobe. 3. No associated hemorrhage or intracranial mass effect. Underlying advanced chronic small vessel disease. 4. Advanced chronic cervical spine degeneration. Electronically Signed   By: Odessa Fleming M.D.   On: 06/17/2023 04:04   CT C-SPINE NO CHARGE Result Date: 06/16/2023 CLINICAL DATA:  Trauma, fall EXAM: CT CERVICAL SPINE WITHOUT CONTRAST TECHNIQUE: Multidetector CT imaging of the cervical spine was performed without intravenous contrast. Multiplanar CT image reconstructions were also generated. RADIATION DOSE REDUCTION: This exam was performed according to the departmental dose-optimization program which includes automated exposure control, adjustment of the mA and/or kV according to patient size and/or use of iterative reconstruction technique. COMPARISON:  None Available. FINDINGS: Alignment: Straightening and slight reversal of the normal cervical lordosis. No listhesis. No facet subluxation or dislocation. Skull base and vertebrae: No compression fracture or displaced fracture in the cervical spine. Multiple small lucent  foci along the endplates  likely reflecting degenerative subcortical cystic change. Soft tissues and spinal canal: No prevertebral fluid or swelling. No visible canal hematoma. 2 cm left thyroid nodule. Disc levels: Moderate disc space narrowing at multiple levels. Degenerative endplate osteophytes most pronounced from C3-4 to C5-6. Disc osteophyte complexes at multiple levels. Mild spinal canal stenosis at C3-4 and C4-5. Facet arthrosis at multiple levels. Significant foraminal narrowing at multiple levels most pronounced at C5-6. Upper chest: Emphysema. Other: None IMPRESSION: No acute fracture or traumatic malalignment of the cervical spine. Degenerative changes as above. 2 cm left thyroid nodule. Recommend nonemergent thyroid ultrasound for further evaluation. Aortic Atherosclerosis (ICD10-I70.0) and Emphysema (ICD10-J43.9). Electronically Signed   By: Emily Filbert M.D.   On: 06/16/2023 12:37   CT ANGIO HEAD NECK W WO CM (CODE STROKE) Result Date: 06/16/2023 CLINICAL DATA:  Code stroke, expressive aphasia, fall. EXAM: CT ANGIOGRAPHY HEAD AND NECK WITH AND WITHOUT CONTRAST TECHNIQUE: Multidetector CT imaging of the head and neck was performed using the standard protocol during bolus administration of intravenous contrast. Multiplanar CT image reconstructions and MIPs were obtained to evaluate the vascular anatomy. Carotid stenosis measurements (when applicable) are obtained utilizing NASCET criteria, using the distal internal carotid diameter as the denominator. RADIATION DOSE REDUCTION: This exam was performed according to the departmental dose-optimization program which includes automated exposure control, adjustment of the mA and/or kV according to patient size and/or use of iterative reconstruction technique. CONTRAST:  60mL OMNIPAQUE IOHEXOL 350 MG/ML SOLN COMPARISON:  Same day CT head.  CTA head and neck 03/16/2021. FINDINGS: CTA NECK FINDINGS Aortic arch: Common origin of the brachiocephalic and left common carotid arteries.  Imaged portion shows no evidence of aneurysm or dissection. Mild atherosclerosis of the aortic arch. No significant stenosis of the major arch vessel origins. Pulmonary arteries: As permitted by contrast timing, there are no filling defects in the visualized pulmonary arteries. Subclavian arteries: The subclavian arteries are patent bilaterally. Atherosclerosis at the origin of the left subclavian artery without significant stenosis. Right carotid system: No evidence of dissection, stenosis (50% or greater), or occlusion. Retropharyngeal course of the distal common carotid artery and proximal cervical ICA. Left carotid system: No evidence of dissection, stenosis (50% or greater), or occlusion. Retropharyngeal course of the distal common carotid artery and proximal cervical ICA. Vertebral arteries: Codominant. No evidence of dissection or occlusion. There is distal tapering and mild narrowing involving the distal V4 segment of the right vertebral artery. No stenosis greater than 50%. Skeleton: No acute findings. Degenerative changes in the cervical spine. Other neck: The visualized airway is patent. No cervical lymphadenopathy. Nodule in the left thyroid lobe measuring up to 2 cm. Upper chest: Emphysema. Dependent opacities likely reflecting atelectasis. There is mild thickening of the upper thoracic esophageal wall. Prominent superior paratracheal node near the tracheoesophageal groove measuring up to 1.2 cm in short axis. Review of the MIP images confirms the above findings CTA HEAD FINDINGS ANTERIOR CIRCULATION: The intracranial ICAs are patent bilaterally. Atherosclerosis of the carotid siphons. Moderate stenosis at the posterior genu of the left cavernous ICA. Additional mild-to-moderate stenosis of the anterior genu cavernous and supraclinoid right ICA. No proximal occlusion, aneurysm, or vascular malformation. MCAs: The middle cerebral arteries are patent bilaterally. There is moderate stenosis of a proximal  M2 inferior division branch of the right MCA without occlusion. Early branching of the left M1 segment. ACAs: The anterior cerebral arteries are patent bilaterally. POSTERIOR CIRCULATION: No significant stenosis, proximal occlusion, aneurysm, or vascular malformation. PCAs:  Patent bilaterally.  Fetal origin of the left PCA. Pcomm: Visualized on the left. SCAs: The superior cerebellar arteries are patent bilaterally. Basilar artery: Patent AICAs: Not well visualized. PICAs: Patent Vertebral arteries: The intracranial vertebral arteries are patent. Venous sinuses: Not well visualized due to contrast timing. Anatomic variants: Fetal origin of the left PCA. Review of the MIP images confirms the above findings IMPRESSION: No large vessel occlusion. Multifocal atherosclerosis as above. Moderate stenosis at the posterior genu of the left cavernous ICA. Additional mild-to-moderate stenosis at the anterior genu of the right cavernous ICA and right supraclinoid ICA. Focal moderate stenosis of a proximal M2 inferior division branch of the right MCA. Wall thickening of the upper thoracic esophagus. Adjacent mildly enlarged upper paratracheal lymph node. Recommend correlation with history of esophagitis. Consider dedicated CT chest for further evaluation. Left thyroid nodule measures up to 2 cm. Recommend correlation with nonemergent thyroid ultrasound. Aortic Atherosclerosis (ICD10-I70.0) and Emphysema (ICD10-J43.9). These results were communicated to Dr. Otelia Limes At 12:05 pm on 06/16/2023 by phone call. Electronically Signed   By: Emily Filbert M.D.   On: 06/16/2023 12:30   CT HEAD CODE STROKE WO CONTRAST Result Date: 06/16/2023 CLINICAL DATA:  Code stroke. Neuro deficit, acute, stroke suspected. Aphasia. Fall. EXAM: CT HEAD WITHOUT CONTRAST TECHNIQUE: Contiguous axial images were obtained from the base of the skull through the vertex without intravenous contrast. RADIATION DOSE REDUCTION: This exam was performed according to  the departmental dose-optimization program which includes automated exposure control, adjustment of the mA and/or kV according to patient size and/or use of iterative reconstruction technique. COMPARISON:  Head CT 03/17/2021 and MRI 03/16/2021 FINDINGS: Brain: There is no evidence of an acute infarct, intracranial hemorrhage, mass, midline shift, or extra-axial fluid collection. Patchy to confluent hypodensities in the cerebral white matter bilaterally have mildly progressed and are nonspecific but compatible with moderate to severe chronic small vessel ischemic disease. A small cortical infarct at the anterior aspect of the left insula and frontal operculum is new but chronic in appearance. There is an unchanged small chronic infarct involving the left basal ganglia and corona radiata. There is mild cerebral atrophy. Vascular: Calcified atherosclerosis at the skull base. No hyperdense vessel. Skull: No acute fracture or suspicious lesion. Sinuses/Orbits: Visualized paranasal sinuses and mastoid air cells are clear. Unremarkable orbits. Other: None. ASPECTS (Alberta Stroke Program Early CT Score) - Ganglionic level infarction (caudate, lentiform nuclei, internal capsule, insula, M1-M3 cortex): 7 - Supraganglionic infarction (M4-M6 cortex): 3 Total score (0-10 with 10 being normal): 10 These results were communicated to Dr. Otelia Limes at 11:48 am on 06/16/2023 by text page via the [facility] messaging system. IMPRESSION: 1. No evidence of acute intracranial abnormality. ASPECTS of 10. 2. Moderate to severe chronic small vessel ischemic disease. 3. Interval small chronic left MCA infarct. Electronically Signed   By: Sebastian Ache M.D.   On: 06/16/2023 11:51    Microbiology Recent Results (from the past 240 hours)  Urine Culture     Status: Abnormal   Collection Time: 06/29/23 11:00 AM   Specimen: Urine, Random  Result Value Ref Range Status   Specimen Description URINE, RANDOM  Final   Special Requests   Final     NONE Reflexed from [id number] Performed at [facility], [address], [place], [place] [address]    Culture >=100,000 COLONIES/mL ESCHERICHIA COLI (A)  Final   Report Status  FINAL  Final   Organism ID, Bacteria ESCHERICHIA COLI (A)  Final  Susceptibility   Escherichia coli - MIC*    AMPICILLIN <=2 SENSITIVE Sensitive     CEFAZOLIN <=4 SENSITIVE Sensitive     CEFEPIME <=0.12 SENSITIVE Sensitive     CEFTRIAXONE <=0.25 SENSITIVE Sensitive     CIPROFLOXACIN <=0.25 SENSITIVE Sensitive     GENTAMICIN <=1 SENSITIVE Sensitive     IMIPENEM <=0.25 SENSITIVE Sensitive     NITROFURANTOIN <=16 SENSITIVE Sensitive     TRIMETH/SULFA <=20 SENSITIVE Sensitive     AMPICILLIN/SULBACTAM <=2 SENSITIVE Sensitive     PIP/TAZO <=4 SENSITIVE Sensitive ug/mL    * >=100,000 COLONIES/mL ESCHERICHIA COLI  MRSA Next Gen by PCR, Nasal     Status: None   Collection Time:   6:13 AM   Specimen: Nasal Mucosa; Nasal Swab  Result Value Ref Range Status   MRSA by PCR Next Gen NOT DETECTED NOT DETECTED Final    Comment: (NOTE) The GeneXpert MRSA Assay (FDA approved for NASAL specimens only), is one component of a comprehensive MRSA colonization surveillance program. It is not intended to diagnose MRSA infection nor to guide or monitor treatment for MRSA infections. Test performance is not FDA approved in patients less than 76 years old. Performed at [facility], [address], [place], [place] [address]     Lab Basic Metabolic Panel: Recent Labs  Lab 06/28/23 1034 06/28/23 1228 06/29/23 0548 06/30/23 0502  0315  0606  0630  NA 138  --  138 137 135 134* 136  K 5.2*  --  5.3* 5.1 6.0* 5.8* 6.1*  CL 109  --  107 105 98  --  99  CO2 13*  --  15* 15* 10*  --  8*  GLUCOSE 127*  --  119* 137* 60*  --  208*  BUN 109*  --  123* 135* 111*  --  115*  CREATININE 3.80*  --  4.26* 5.25* 5.14*  --  5.30*  CALCIUM 9.1  --  8.6* 7.5* 6.8*  --  6.5*  MG  --   3.5*  --   --  3.7*  --   --   PHOS  --  7.5*  --   --  10.7*  --   --    Liver Function Tests: Recent Labs  Lab  0315  AST 2,263*  ALT 961*  ALKPHOS 177*  BILITOT 6.6*  PROT 6.6  ALBUMIN 3.1*   No results for input(s): "LIPASE", "AMYLASE" in the last 168 hours. No results for input(s): "AMMONIA" in the last 168 hours. CBC: Recent Labs  Lab 06/28/23 1034 06/29/23 0548 06/30/23 0502  0315  0606  WBC 8.3 8.0 8.3 9.8  --   NEUTROABS  --   --   --  8.9*  --   HGB 10.7* 10.5* 10.5* 10.9* 10.9*  HCT 33.4* 32.8* 32.2* 33.8* 32.0*  MCV 94.4 92.7 92.0 93.4  --   PLT PLATELET CLUMPS NOTED ON SMEAR, UNABLE TO ESTIMATE PLATELET CLUMPS NOTED ON SMEAR, UNABLE TO ESTIMATE PLATELET CLUMPS NOTED ON SMEAR, UNABLE TO ESTIMATE PLATELET CLUMPS NOTED ON SMEAR, UNABLE TO ESTIMATE  --    Cardiac Enzymes: No results for input(s): "CKTOTAL", "CKMB", "CKMBINDEX", "TROPONINI" in the last 168 hours. Sepsis Labs: Recent Labs  Lab 06/28/23 1034 06/28/23 2232 06/29/23 0548 06/30/23 0502 06/30/23 2230  0315  0630  PROCALCITON  --   --   --   --   --  0.98  --   WBC 8.3  --  8.0 8.3  --  9.8  --   LATICACIDVEN  --    < > 2.8*  --  5.3* >9.0* >9.0*   < > = values in this interval not displayed.    Procedures/Operations  4/2 R internal jugular HD cath 4/3 intubation    Lanier Clam , 12:42 PM

## 2023-07-29 NOTE — Progress Notes (Signed)
 Events of the night reviewed and case discussed with PCCM NP. Appreciate Critical Care support and given his decompensation overnight and intubation TRH will sign off the case as he is being managed in the intensive care unit. TRH to resume care once he is stable enough to be transferred out of the ICU.

## 2023-07-29 NOTE — IPAL (Signed)
  Interdisciplinary Goals of Care Family Meeting   Date carried out:   Location of the meeting: Bedside  Member's involved: Nurse Practitioner, Bedside Registered Nurse, and Family Member or next of kin  Durable Power of Attorney or acting medical decision maker: Wife Robert Horn     Discussion: We discussed goals of care for Robert Horn .  I met with pts wife and Robert Horn at the bedside. We talked about the events of last night as well as the information we have obtained regarding his low-flow state, his ongoing shock, worsening acidosis etc.  A decision was reached to transition to comfort care   Will place orders for comfort focussed care. No add'l labs imaging. No meds not focussed on comfort  Compassionate liberation from vent when resp sx are managed & then dc NE.   Anticipate in-hospital death, likely minutes-hours   Code status: DNAR   Disposition: In-patient comfort care  Time spent for the meeting:    Lanier Clam, NP  , 8:50 AM

## 2023-07-29 NOTE — Progress Notes (Signed)
 PHARMACY - ANTICOAGULATION CONSULT NOTE  Pharmacy Consult for heparin Indication: atrial fibrillation  Allergies  Allergen Reactions   Ticagrelor Other (See Comments)    Unknown     Patient Measurements: Height: 6' (182.9 cm) Weight: 97.9 kg (215 lb 13.3 oz) IBW/kg (Calculated) : 77.6 HEPARIN DW (KG): 94.3  Vital Signs: Temp: 97.5 F (36.4 C) (04/02 2244) Temp Source: Rectal (04/02 2244) BP: 124/92 (04/02 2006) Pulse Rate: 112 (04/02 2006)  Labs: Recent Labs    06/28/23 1034 06/28/23 1228 06/28/23 1427 06/28/23 2232 06/29/23 0548 06/30/23 0502 06/30/23 2230  HGB 10.7*  --   --   --  10.5* 10.5*  --   HCT 33.4*  --   --   --  32.8* 32.2*  --   PLT PLATELET CLUMPS NOTED ON SMEAR, UNABLE TO ESTIMATE  --   --   --  PLATELET CLUMPS NOTED ON SMEAR, UNABLE TO ESTIMATE PLATELET CLUMPS NOTED ON SMEAR, UNABLE TO ESTIMATE  --   APTT  --   --   --   --   --   --  198*  HEPARINUNFRC  --   --   --   --   --   --  >1.10*  CREATININE 3.80*  --   --   --  4.26* 5.25*  --   TROPONINIHS 205* 215* 226* 230*  --   --   --     Estimated Creatinine Clearance: 12 mL/min (A) (by C-G formula based on SCr of 5.25 mg/dL (H)).   Medical History: Past Medical History:  Diagnosis Date   Anemia    Anxiety    situational- surgery   Asthma    as a child   Cancer (HCC) 2019   colon- colectomy    CHF (congestive heart failure) (HCC)    Chronic kidney disease    followed by Dr. Nehemiah Settle   Coronary artery disease    Diabetes mellitus without complication (HCC)    Type II   Dyslipidemia 10/27/2015   Dyspnea    w/ exertion    Elevated PSA 10/27/2015   Erectile dysfunction 10/27/2015   Heart murmur    Hypertension 10/27/2015   Hypogonadism male 10/27/2015   Neuropathy    Obesity 10/27/2015   Peripheral vascular disease (HCC)    Pneumonia 10/27/2015   pt states was 1982   Rotator cuff tear 10/27/2015    Medications:  Medications Prior to Admission  Medication Sig Dispense Refill  Last Dose/Taking   apixaban (ELIQUIS) 2.5 MG TABS tablet Take 1 tablet (2.5 mg total) by mouth 2 (two) times daily. 60 tablet 1 06/27/2023 at  9:30 AM   benzonatate (TESSALON) 100 MG capsule Take 100 mg by mouth 3 (three) times daily as needed for cough.   Unknown   Cholecalciferol (VITAMIN D) 50 MCG (2000 UT) tablet Take 2,000 Units by mouth daily.   06/27/2023   diclofenac Sodium (VOLTAREN) 1 % GEL Apply 1 Application topically as needed (pain).   Unknown   ezetimibe (ZETIA) 10 MG tablet Take 1 tablet (10 mg total) by mouth daily. 90 tablet 3 06/27/2023   JARDIANCE 25 MG TABS tablet Take 25 mg by mouth daily.   06/27/2023   metoprolol succinate (TOPROL-XL) 25 MG 24 hr tablet Take 1 tablet (25 mg total) by mouth daily. (Patient taking differently: Take 50 mg by mouth daily.) 30 tablet 1 06/27/2023   midodrine (PROAMATINE) 2.5 MG tablet Take 1 tablet (2.5 mg total) by mouth 2 (two)  times daily with a meal. 60 tablet 1 06/27/2023   rosuvastatin (CRESTOR) 40 MG tablet Take 1 tablet (40 mg total) by mouth daily. (Patient taking differently: Take 40 mg by mouth every evening.) 30 tablet 5 06/27/2023   VITAMIN B COMPLEX-C PO Take 1 tablet by mouth daily.   06/27/2023   furosemide (LASIX) 40 MG tablet Take 40 mg by mouth daily. (Patient not taking: Reported on 06/28/2023)   Not Taking   furosemide (LASIX) 80 MG tablet Take 40 mg by mouth every other day. Alternating with 40mg  tablet (Patient not taking: Reported on 06/28/2023)   Not Taking   OZEMPIC, 0.25 OR 0.5 MG/DOSE, 2 MG/3ML SOPN Inject 0.25 mg into the skin once a week. (Patient not taking: Reported on 06/28/2023)   Not Taking   Scheduled:   atorvastatin  80 mg Oral QPM   Chlorhexidine Gluconate Cloth  6 each Topical Q0600   ezetimibe  10 mg Oral Daily    Assessment: 89 yo male with history of afib on apixaban at home. He is not able to take the medication due to confusion. Pharmacy consulted to dose heparin  -last dose of apixaban 4/1 in the  morning -hg= 10.5 -He is also noted with AKI on CKD and s/p temp HD cath this morning  4/3 AM update:  aPTT supra-therapeutic Heparin running peripherally and lab drawn from HD cath Some mild oozing around new HD cath site-watch closely  Goal of Therapy:  Heparin level 0.3-0.7 units/ml aPTT 66-102 seconds Monitor platelets by anticoagulation protocol: Yes   Plan:  Hold heparin x 1 hr Re-start heparin at 1100 units/hr Heparin level and aPTT in 8 hours  Abran Duke, PharmD, BCPS Clinical Pharmacist Phone: [phone]

## 2023-07-29 NOTE — Progress Notes (Addendum)
 Spoke with patient's wife Fulton Mole) and daughter after patient was bradycardic and had respiratory arrest s/p intubation and transferred to ICU. Patient's family expressed patient has been dealing with a lot with his medical conditions and would not want further aggressive interventions. Discussed code status with family and wife expressed that patient would not want to be resuscitated. Patient's wife expressed making sure patient is comfortable given all that has happened to him. Relayed conversation and code status change to PCCM team (Dr. Gaynell Face).   -Code Status Change: DNR/DNI

## 2023-07-29 NOTE — Progress Notes (Addendum)
 Patient was seen for hypothermia, hypoxia, and AMS.   Brief HPI:  89 y.o. male with hx of HFrEF, atrial fibrillation on Eliquis, severe AS s/p TAVR (2018), PAD s/p right transmetatarsal amputation, colon cancer s/p colectomy, CKD IIIb, T2DM, HTN, HLD, and recent CVA who presented on 06/28/23 with SOB and generalized weakness, was found to have AKI, and was admitted.   He had HD catheter placed and underwent 1st HD on 4/2.   Overnight, he has become hypothermic and hypoxic. He has remained encephalopathic.   CXR reveals retrocardiac opacity.   S: Pt encephalopathic and not contributing to history.   O: BP 119/101, HR 87, RR 27, O2 saturation 70% (difficult to obtain pulse ox reading)   He is tachypneic with accessory muscle use. Rhonchi noted on left. Abd soft and non-tender. Extremities cool. Moving all extremities, localizes to pain.    A&P:  1. Acute hypoxic respiratory failure; ?retrocardiac pneumonia  - Check ABG, add doxycyline (already on Rocephin; QT is prolonged)   2. Hypothermia - Likely d/t uremia, less likely sepsis, not hypoglycemic  - Culture blood, check cortisol, continue warming blanket, antibiotics    3. Lactic acidosis  - Lactate ordered by cardiology d/t concern for low output state  - Cardiology fellow updated on labs    Will consult Critical Care, paged at 04:00.

## 2023-07-29 NOTE — Significant Event (Addendum)
 Rapid Response Event Note   Reason for Call :  Hypoxia  Initial Focused Assessment:  Pt lying in bed with eyes closed. He is calm with no stimulation but gets agitated with stimulation. He moves all extremities spontaneously and equally. He does not follow commands or speak. He will moan spontaneously. Lungs clear/diminished. ABD soft/NT. Skin cool to touch.  T-97.5, HR-106, SBP-130S, RR-22s, SpO2-99% on NRB  Pt oxygen titrated down to 2L [place] with SpO2-94%.   Interventions:  NRB PCXR-Retrocardiac atelectasis or pneumonia. Cardiomegaly.  Plan of Care:  Pt titrated off NRB. His mental status is at baseline per bedside RN. His breathing isn't labored. Await PCXR. Continue to monitor pt. Please call RRT if further assistance needed.   Event Summary:   MD Notified: Dr. Antionette Char notified by bedside RN Call [phone] Arrival Time:0026 End Time:0050  Terrilyn Saver, RN  Update: 0248>unable to obtain SpO2 reading despite multiple sites and multiple devices. Co-ox-36.5, LA-2.8>5.3. Labored breawthing. Mental status unchanged from earlier.   Dr. Antionette Char to bedside.  Interventions:  NRB until ABG obtained Repeat LA now >9 ABG-7.33/<18/276/8.6 CBG-30>1 amp D50 given IV PCCM consulted Cards MD notified-HF team to be consulted in AM.  0432: Pt bradycardia 50s, SBP-50s, agonal breathing, pulse present. 1amp Atropine given and bagging on 100% FiO2 initiated and  respiratory arrest Code Blue initiated. Pt was given 1 amp bicarb once Code MD to bedside. Pt intubated by Anesthesia and taken to 2H. Levo 2mg  started just prior to transport to 2H.

## 2023-07-29 DEATH — deceased

## 2023-07-30 ENCOUNTER — Ambulatory Visit (HOSPITAL_COMMUNITY)

## 2023-08-02 ENCOUNTER — Ambulatory Visit (HOSPITAL_COMMUNITY)

## 2023-08-04 ENCOUNTER — Ambulatory Visit (HOSPITAL_COMMUNITY)

## 2023-08-06 ENCOUNTER — Ambulatory Visit (HOSPITAL_COMMUNITY)

## 2023-08-09 ENCOUNTER — Ambulatory Visit (HOSPITAL_COMMUNITY)

## 2023-08-09 ENCOUNTER — Inpatient Hospital Stay: Admitting: Adult Health

## 2023-08-11 ENCOUNTER — Ambulatory Visit (HOSPITAL_COMMUNITY)

## 2023-08-13 ENCOUNTER — Ambulatory Visit (HOSPITAL_COMMUNITY)

## 2023-08-16 ENCOUNTER — Ambulatory Visit (HOSPITAL_COMMUNITY)

## 2023-08-17 ENCOUNTER — Ambulatory Visit: Payer: Medicare HMO | Admitting: Oncology

## 2023-08-18 ENCOUNTER — Ambulatory Visit (HOSPITAL_COMMUNITY)

## 2023-08-20 ENCOUNTER — Ambulatory Visit (HOSPITAL_COMMUNITY)

## 2023-08-24 MED FILL — Medication: Qty: 1 | Status: AC

## 2023-08-25 ENCOUNTER — Ambulatory Visit (HOSPITAL_COMMUNITY)

## 2023-08-27 ENCOUNTER — Ambulatory Visit (HOSPITAL_COMMUNITY)

## 2023-08-30 ENCOUNTER — Ambulatory Visit (HOSPITAL_COMMUNITY)

## 2023-09-01 ENCOUNTER — Ambulatory Visit (HOSPITAL_COMMUNITY)

## 2023-09-03 ENCOUNTER — Ambulatory Visit (HOSPITAL_COMMUNITY)

## 2023-09-06 ENCOUNTER — Ambulatory Visit (HOSPITAL_COMMUNITY)

## 2023-09-08 ENCOUNTER — Ambulatory Visit (HOSPITAL_COMMUNITY)

## 2023-09-10 ENCOUNTER — Ambulatory Visit (HOSPITAL_COMMUNITY)

## 2023-09-13 ENCOUNTER — Ambulatory Visit (HOSPITAL_COMMUNITY)

## 2023-09-15 ENCOUNTER — Ambulatory Visit (HOSPITAL_COMMUNITY)

## 2023-09-28 ENCOUNTER — Ambulatory Visit: Payer: Medicare HMO

## 2023-09-28 ENCOUNTER — Encounter (HOSPITAL_COMMUNITY): Payer: Medicare HMO
